# Patient Record
Sex: Female | Born: 1950 | Race: White | Hispanic: No | Marital: Married | Smoking: Former smoker
Health system: Southern US, Community
[De-identification: ages and names within clinical notes are randomized; demographics above are authoritative.]

## PROBLEM LIST (undated history)

## (undated) DIAGNOSIS — F1120 Opioid dependence, uncomplicated: Secondary | ICD-10-CM

## (undated) DIAGNOSIS — R51 Headache: Secondary | ICD-10-CM

## (undated) DIAGNOSIS — C50919 Malignant neoplasm of unspecified site of unspecified female breast: Secondary | ICD-10-CM

## (undated) DIAGNOSIS — E039 Hypothyroidism, unspecified: Secondary | ICD-10-CM

## (undated) DIAGNOSIS — S72451A Displaced supracondylar fracture without intracondylar extension of lower end of right femur, initial encounter for closed fracture: Secondary | ICD-10-CM

## (undated) DIAGNOSIS — K219 Gastro-esophageal reflux disease without esophagitis: Secondary | ICD-10-CM

## (undated) DIAGNOSIS — M1990 Unspecified osteoarthritis, unspecified site: Secondary | ICD-10-CM

## (undated) DIAGNOSIS — D649 Anemia, unspecified: Secondary | ICD-10-CM

## (undated) DIAGNOSIS — J189 Pneumonia, unspecified organism: Secondary | ICD-10-CM

## (undated) DIAGNOSIS — E079 Disorder of thyroid, unspecified: Secondary | ICD-10-CM

## (undated) DIAGNOSIS — I10 Essential (primary) hypertension: Secondary | ICD-10-CM

## (undated) DIAGNOSIS — F419 Anxiety disorder, unspecified: Secondary | ICD-10-CM

## (undated) DIAGNOSIS — T4145XA Adverse effect of unspecified anesthetic, initial encounter: Secondary | ICD-10-CM

## (undated) DIAGNOSIS — M8080XA Other osteoporosis with current pathological fracture, unspecified site, initial encounter for fracture: Secondary | ICD-10-CM

## (undated) DIAGNOSIS — F32A Depression, unspecified: Secondary | ICD-10-CM

## (undated) DIAGNOSIS — F329 Major depressive disorder, single episode, unspecified: Secondary | ICD-10-CM

## (undated) DIAGNOSIS — G8929 Other chronic pain: Secondary | ICD-10-CM

## (undated) DIAGNOSIS — M810 Age-related osteoporosis without current pathological fracture: Secondary | ICD-10-CM

## (undated) DIAGNOSIS — Z803 Family history of malignant neoplasm of breast: Secondary | ICD-10-CM

## (undated) HISTORY — PX: JOINT REPLACEMENT: SHX530

## (undated) HISTORY — DX: Family history of malignant neoplasm of breast: Z80.3

## (undated) HISTORY — PX: BACK SURGERY: SHX140

## (undated) HISTORY — PX: TONSILLECTOMY: SUR1361

---

## 1975-10-22 HISTORY — PX: CHOLECYSTECTOMY: SHX55

## 1998-09-01 ENCOUNTER — Ambulatory Visit (HOSPITAL_COMMUNITY): Admission: RE | Admit: 1998-09-01 | Discharge: 1998-09-01 | Payer: Self-pay | Admitting: Neurosurgery

## 1998-09-01 ENCOUNTER — Encounter: Payer: Self-pay | Admitting: Neurosurgery

## 1998-09-20 HISTORY — PX: NECK SURGERY: SHX720

## 1998-10-31 ENCOUNTER — Encounter: Payer: Self-pay | Admitting: Neurosurgery

## 1998-11-05 ENCOUNTER — Encounter: Payer: Self-pay | Admitting: Neurosurgery

## 1998-11-05 ENCOUNTER — Inpatient Hospital Stay (HOSPITAL_COMMUNITY): Admission: RE | Admit: 1998-11-05 | Discharge: 1998-11-06 | Payer: Self-pay | Admitting: Neurosurgery

## 1998-12-18 ENCOUNTER — Encounter: Payer: Self-pay | Admitting: Neurosurgery

## 1998-12-18 ENCOUNTER — Ambulatory Visit (HOSPITAL_COMMUNITY): Admission: RE | Admit: 1998-12-18 | Discharge: 1998-12-18 | Payer: Self-pay | Admitting: Neurosurgery

## 1999-03-05 ENCOUNTER — Other Ambulatory Visit: Admission: RE | Admit: 1999-03-05 | Discharge: 1999-03-05 | Payer: Self-pay | Admitting: Obstetrics and Gynecology

## 2001-06-26 ENCOUNTER — Encounter: Admission: RE | Admit: 2001-06-26 | Discharge: 2001-09-24 | Payer: Self-pay | Admitting: Family Medicine

## 2001-09-07 ENCOUNTER — Other Ambulatory Visit: Admission: RE | Admit: 2001-09-07 | Discharge: 2001-09-07 | Payer: Self-pay | Admitting: Obstetrics & Gynecology

## 2001-09-20 HISTORY — PX: GASTRIC BYPASS: SHX52

## 2001-09-20 HISTORY — PX: REPLACEMENT TOTAL KNEE: SUR1224

## 2002-04-22 ENCOUNTER — Emergency Department (HOSPITAL_COMMUNITY): Admission: EM | Admit: 2002-04-22 | Discharge: 2002-04-22 | Payer: Self-pay | Admitting: Emergency Medicine

## 2003-12-12 ENCOUNTER — Other Ambulatory Visit: Admission: RE | Admit: 2003-12-12 | Discharge: 2003-12-12 | Payer: Self-pay | Admitting: Obstetrics & Gynecology

## 2004-06-09 ENCOUNTER — Emergency Department (HOSPITAL_COMMUNITY): Admission: EM | Admit: 2004-06-09 | Discharge: 2004-06-10 | Payer: Self-pay | Admitting: Emergency Medicine

## 2004-09-09 ENCOUNTER — Encounter (INDEPENDENT_AMBULATORY_CARE_PROVIDER_SITE_OTHER): Payer: Self-pay | Admitting: *Deleted

## 2004-09-09 ENCOUNTER — Ambulatory Visit (HOSPITAL_COMMUNITY): Admission: RE | Admit: 2004-09-09 | Discharge: 2004-09-09 | Payer: Self-pay | Admitting: Gastroenterology

## 2005-05-10 ENCOUNTER — Inpatient Hospital Stay (HOSPITAL_COMMUNITY): Admission: RE | Admit: 2005-05-10 | Discharge: 2005-05-13 | Payer: Self-pay | Admitting: Orthopedic Surgery

## 2005-11-17 ENCOUNTER — Inpatient Hospital Stay (HOSPITAL_COMMUNITY): Admission: RE | Admit: 2005-11-17 | Discharge: 2005-11-20 | Payer: Self-pay | Admitting: Orthopedic Surgery

## 2006-04-18 ENCOUNTER — Encounter: Admission: RE | Admit: 2006-04-18 | Discharge: 2006-04-18 | Payer: Self-pay | Admitting: Neurosurgery

## 2006-06-10 ENCOUNTER — Inpatient Hospital Stay (HOSPITAL_COMMUNITY): Admission: RE | Admit: 2006-06-10 | Discharge: 2006-06-12 | Payer: Self-pay | Admitting: Neurosurgery

## 2007-12-05 ENCOUNTER — Emergency Department (HOSPITAL_COMMUNITY): Admission: EM | Admit: 2007-12-05 | Discharge: 2007-12-05 | Payer: Self-pay | Admitting: Emergency Medicine

## 2009-09-16 ENCOUNTER — Emergency Department (HOSPITAL_BASED_OUTPATIENT_CLINIC_OR_DEPARTMENT_OTHER): Admission: EM | Admit: 2009-09-16 | Discharge: 2009-09-16 | Payer: Self-pay | Admitting: Emergency Medicine

## 2009-09-16 ENCOUNTER — Ambulatory Visit: Payer: Self-pay | Admitting: Diagnostic Radiology

## 2010-09-08 ENCOUNTER — Emergency Department (HOSPITAL_BASED_OUTPATIENT_CLINIC_OR_DEPARTMENT_OTHER)
Admission: EM | Admit: 2010-09-08 | Discharge: 2010-09-08 | Payer: Self-pay | Source: Home / Self Care | Admitting: Emergency Medicine

## 2011-02-05 NOTE — Discharge Summary (Signed)
Deborah Cooke, Deborah Cooke                  ACCOUNT NO.:  [id number]   MEDICAL RECORD NO.:  [id number]          PATIENT TYPE:   LOCATION:                                 FACILITY:   PHYSICIAN:  Kirstin Tomasa Rand, P.A.    DATE OF BIRTH:   DATE OF ADMISSION:  05/10/2005  DATE OF DISCHARGE:  05/13/2005                                 DISCHARGE SUMMARY   ADMISSION DIAGNOSES:  1.  End-stage degenerative joint disease, left knee.  2.  Neuropathy.  3.  Degenerative joint disease.  4.  Depression/anxiety.   DISCHARGE DIAGNOSES:  1.  End-stage degenerative joint disease, left knee, status post total knee.  2.  Neuropathy.  3.  Degenerative joint disease.  4.  Depression/anxiety.   HISTORY OF PRESENT ILLNESS:  The patient is a 60 year old female who has  bilateral knee end-stage degenerative joint disease. She has failed  conservative care including anti-inflammatories, cortisone injections. She  understands the risks, benefits, and possible complications of a left total  knee replacement and is without question.   PROCEDURES IN-HOUSE:  On May 10, 2005, the patient underwent a left total  knee replacement by Dr. Thurston Hole and a femoral nerve block by anesthesia. She  tolerated both procedures well. She was admitted postoperatively for pain  control, DVT prophylaxis, and physical therapy. On postoperative day one,  the patient was alert and oriented, doing well, no complaints. T-max was  98.6. INR was 1.1. Her IV was discontinued and her PCA was discontinued. She  was started on Oxy-IR 5 mg one to three q.4h. p.r.n. pain. Her drain was  discontinued and her dressing was changed. On postoperative day two, T-max  was 100.8, potassium 3.1, hemoglobin 9.9. Her Foley was discontinued. Her  dressing was changed. Discharge planning was started on postoperative day  three. The patient was doing well. She did have a urinalysis that did show  Escherichia coli. Therefore, was discharged to home on Cipro  500 mg one p.o.  b.i.d. Also, Coumadin 5 mg one and a half tablet daily, Oxy-IR 5 mg one to  two q.4-6h., Neurontin 800 mg two tabs b.i.d., Zantac 150 mg b.i.d.,  Zanaflex 4 mg two tablets b.i.d., Lasix 20 mg daily, lorazepam 2 mg q.h.s.,  Kay Ciel 10 mEq daily, Xanax 0.5 mg daily, Wellbutrin 150 mg b.i.d., Biotin  1000 mg b.i.d., multivitamin with iron daily, calcium 600 mg b.i.d., B  Complex 100 mg daily, chromium picolinate 200 mg b.i.d. She is weight  bearing as tolerated, on a regular diet. She will use her CPM 0-90 degrees 8  hours a day, elevate her left heel on a folded pillow 30 minutes every  morning. She will call with increased pain or temperature greater than 101.  She will change her dressings daily. Call with increased redness, increased  swelling, or increased drainage. She can shower or bathe on May 17, 2005,  and she is to follow up with Dr. Thurston Hole on May 25, 2005.      Julien Girt, P.A.    KS/MEDQ  D:  08/30/2005  T:  08/30/2005  Job:  [id number]

## 2011-02-05 NOTE — Op Note (Signed)
Deborah Cooke, PROM                  ACCOUNT NO.:  [id number]   MEDICAL RECORD NO.:  [id number]          PATIENT TYPE:  INP   LOCATION:  5018                         FACILITY:  [facility]   PHYSICIAN:  Elana Alm. Thurston Hole, M.D. DATE OF BIRTH:  1951/01/24   DATE OF PROCEDURE:  05/10/2005  DATE OF DISCHARGE:                                 OPERATIVE REPORT   PREOPERATIVE DIAGNOSIS:  Left knee degenerative joint disease.   POSTOPERATIVE DIAGNOSIS:  Left knee degenerative joint disease.   PROCEDURE:  1.  Left total knee replacement using DePuy cemented total knee system with      #3 cemented femur, #3 cemented tibia with 12.5 mm polyethylene RP tibial      spacer and 35 mm polyethylene cemented patella.  2.  Left total knee computer assisted navigation.   SURGEON:  Dr. Salvatore Marvel.   ASSISTANT:  Kirstin Tomasa Rand, P.A.   ANESTHESIA:  General.   OPERATIVE TIME:  1 hour 45 minutes.   TOURNIQUET TIME:  1 hour 20 minutes.   COMPLICATIONS:  None.   DESCRIPTION OF PROCEDURE:  Ms. Steines was brought to the operating room on  May 10, 2005, placed on the operative table in the supine position.  A  femoral nerve block was then placed in the holding room by Anesthesia for  postoperative pain control.  She received Ancef 1 g IV preoperatively for  prophylaxis.  After being placed under general anesthesia, she had a Foley  catheter placed under sterile conditions.  Her left knee was examined.  Range of motion from -10 to 125 degrees, knee stable to ligamentous exam  with normal patella tracking with moderate varus deformity.  Her left foot  and leg was then prepped using sterile DuraPrep and draped using sterile  technique.  Leg was exsanguinated and a thigh tourniquet elevated to 375 mm.  Initially, through a 15 cm longitudinal incision based over the patella  initial exposure was made.  Underlying subcutaneous tissues were incised in  line with the skin incision.  A median arthrotomy was  performed, revealing  an excessive amount of normal appearing joint fluid.  The articular surfaces  were inspected.  She had grade 4 changes medially, grade 3 and 4 changes  laterally and grade 3 and 4 changes in the patellofemoral joint.  Osteophytes were removed from the femoral condyles and tibial plateau.  The  medial and lateral meniscal remnants were removed as well as the anterior  cruciate ligament.  At this point then, two pins were placed in the proximal  tibial shaft and two pins placed in the distal femoral shaft for placement  of the computer assisted navigation devices.  These were then placed in  position and activated.  Initial deformity showed flexion deformity of 9  degrees and varus deformity of 6 degrees.  Using the computer navigation, a  distal femoral cut was then made, resecting 10 mm off the distal femur, and  then the distal femur was sized.  This was found to be a #3 size.  The #3  cutting jig was placed and  then these cuts were made and they were verified  from the computer navigation system.  The proximal tibial cut was then made  also using computer navigation, resecting 10 mm off the lateral side and 6  mm off the medial or lower side with the appropriate amount of rotation and  slope.  This was also verified with the navigation system.  After this was  done, then flexion and extension gaps were tested and they were found to be  excellent and equal with a 12.5 mm spacer.  At this point then the #3 tibial  base plate was placed and the keel cut was made.  The box cutter for the PCL  prosthesis was also then placed and then these cuts were made.  At this  point then, the #3 femoral trial was placed, the #3 tibial base plate trial  was placed and with a 12.5 mm polyethylene RP tibial spacer there was found  to be excellent restoration of normal alignment, excellent correction of her  varus and flexion deformities, range of motion from 0-125 degrees.  The  patella  was incised.  A resurfacing 8.5 mm cut was made and 3 locking holes  were placed for placement of the 35 mm polyethylene patella trial which was  placed.  Patellofemoral tracking was again evaluated and this was found to  be normal.  At this point, it was felt that all of the trial components were  of excellent size, fit and stability.  They were then removed and the knee  was then gent lavage irrigated with 3 liters of saline solution.  The  proximal tibia was then exposed and the #3 tibial base plate was cemented  back and was hammered into position with an excellent fit and with excess  cement being removed from around the edges.  A #3 femoral component was  cemented back and was hammered into position, also with an excellent fit  with excess cement being removed from around the edges.  A #3 femoral  component was cemented back and was hammered into position, also with an  excellent fit, with excess cement being removed from around the edges.  The  12.5 mm polyethylene RP tibial spacer was then placed on the tibial base  plate.  The knee was then reduced, taken through a full range of motion,  found to be stable, leg lengths equal with excellent correction of her varus  and flexion deformity.  The 35 mm polyethylene cement back patella was then  placed in its position and held there with a clamp.  After the cement had  hardened, patellofemoral tracking was evaluated and this was found to be  normal.  At this point, it was felt that all of the components were of  excellent size, fit and stability.  The tourniquet was then released,  hemostasis was obtained with cautery.  The arthrotomy was then closed with  #3 Ethibond suture over two medium Hemovac drains.  Subcutaneous tissue was  closed with 0 and 2-0 Vicryl, subcuticular layer closed with 4-0 Monocryl,  Steri-Strips were applied, sterile dressings and a long leg splint applied, the Hemovac injected with 0.25% Marcaine with epinephrine  and 4 mg of  morphine and clamped, the patient then awakened, extubated and taken to the  recovery room in stable condition.  Needle and sponge counts correct x2 at  the end of the case.      Robert A. Thurston Hole, M.D.  Electronically Signed     RAW/MEDQ  D:  05/10/2005  T:  05/10/2005  Job:  [id number]

## 2011-02-05 NOTE — Op Note (Signed)
Deborah Cooke, Deborah Cooke NO.:  [id number]   MEDICAL RECORD NO.:  [id number]          PATIENT TYPE:  INP   LOCATION:  2899                         FACILITY:  [facility]   PHYSICIAN:  Hilda Lias, M.D.   DATE OF BIRTH:  Nov 03, 1950   DATE OF PROCEDURE:  06/10/2006  DATE OF DISCHARGE:                                 OPERATIVE REPORT   PREOPERATIVE DIAGNOSES:  C7-T1 spondylolisthesis with C8 radiculopathy,  status post anterior/posterior fusion times three.   POSTOPERATIVE DIAGNOSES:  C7-T1 spondylolisthesis with C8 radiculopathy,  status post anterior/posterior fusion times three.   PROCEDURES:  C7-T1 laminotomy and foraminotomy; lateral mass screws, C7-T1;  posterolateral arthrodesis.   SURGEON:  Hilda Lias, M.D.   ASSISTANT:  Clydene Fake, M.D.   CLINICAL HISTORY:  [name] is a 61 year old female, who had  anterior/posterior fusion x3 by a different surgeon.  Now, the lady is  complaining of neck pain, with pain going to both upper extremities.  X-rays  showed that she is well fused, but at the level of C7-T1, she has  spondylolisthesis, with a 4 to 5-mm stepoff.  Surgery was advised.  This  lady has had multiple surgeries:  gastric bypass, knee replacement, 3  cervical procedures.  She knows about the risks, such as infection, no  improvement whatsoever, failure of the hardware, need for further surgery.   DESCRIPTION OF PROCEDURE:  The patient was taken to the OR, and after  intubation, pins were applied to the head and she was positioned in a prone  manner.  The neck was prepped with DuraPrep.  Drapes were applied.  A  midline incision was made from the lower cervical area to the upper thoracic  area.  We found quite a bit of scar tissue, but, again, we were able to  disattach the muscle from the spinous processes and laminae.  We brought the  C-arm, but this lady is quite obese with a large shoulder, and it was  difficult to see any landmarks.   Nevertheless, we used a towel clip, and  there was an area were C7-T1 was loose.  Bone below was completely solid.  We counted and, indeed, that was C7-T1.  Immediately, we did a drill; we did  a laminotomy of C6 and C7 and T1, and foraminotomies with decompression of  the nerve roots.  On the right side, it was quite easy; on the left side,  the ligament was calcified.  Because we were not using the C-arm, with the  nerve hook, we localized the pedicle.  The pedicle was probed and 2 screws  of 18 mm were inserted.  A lateral screw of 5 mm was inserted in the facet  of C7.  That area was completely solid.  Then, a rod was put in place and  secured in place with a cap.  From then on, we  removed the periosteum of the laminae of C7 and thoracic 1, as well as  lateral.  A mix of autograft, allograft and BMP was used for arthrodesis.  From then on, the area  was irrigated.  The wound was closed with Vicryl and  Steri-Strips.           ______________________________  Hilda Lias, M.D.     EB/MEDQ  D:  06/10/2006  T:  06/10/2006  Job:  [id number]   cc:   Clydene Fake, M.D.

## 2011-02-05 NOTE — Discharge Summary (Signed)
NAMEPHILICIA, Deborah Cooke                  ACCOUNT NO.:  [id number]   MEDICAL RECORD NO.:  [id number]          PATIENT TYPE:  INP   LOCATION:  5028                         FACILITY:  [facility]   PHYSICIAN:  Elana Alm. Thurston Hole, M.D. DATE OF BIRTH:  [birth year]   DATE OF ADMISSION:  11/17/2005  DATE OF DISCHARGE:  11/20/2005                                 DISCHARGE SUMMARY   ADMISSION DIAGNOSES:  1.  End-stage degenerative joint disease right knee.  2.  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  3.  Hypertension.   DISCHARGE DIAGNOSES:  1.  End-stage degenerative joint disease right knee, status post total knee      replacement.  2.  Chronic pain from back surgery x3.  3.  Hypertension.  4.  Asthma.   PROCEDURE:  On November 17, 2005, patient underwent a right total knee  replacement by Dr. Thurston Hole and a femoral nerve block by anesthesia.   HISTORY OF PRESENT ILLNESS:  Patient is a 60 year old white female who has a  history of bilateral knee end-stage DJD.  She had a left total knee  replacement May 10, 2005, and is now doing very well with this.  Her  right knee has failed conservative care.  She understands risks, benefits  and possible complications of a total knee replacement and is without  question.   She was admitted on November 17, 2005, and underwent total knee replacement  and femoral nerve block and postoperatively was admitted for pain control,  DVT prophylaxis and physical therapy.   On postoperative day #1, patient had significant difficulty with low back  spasms but her knee was good.  T-max of 100.4, hemoglobin was 9.3, her INR  was 1.1.  She was metabolically stable.  She was started on OxyContin 10 mg  one p.o. b.i.d., Oxy IR 1 to 2 mg q.3h. p.r.n. pain, Robaxin, no rehab  consult was ordered.  No social work consult was ordered.  Patient ambulated  20 feet with a rolling walker, minimal assist and did well.   Postoperative day #2, patient continued to improve.   T-max of 101.1, blood  pressure was 95/67.  Hemoglobin was 8.8.  She was metabolically stable.  A  follow-up UA was ordered which came back completely clear.  Surgical wound  is well approximated.  Her dressing was changed.  Her Foley was  discontinued.  Her IV was heplocked and she was given a suppository.   On postoperative day #3, she was discharged to home in stable condition.  Her hemoglobin was 8.4, white cell count 7.9.  She was metabolically stable.  Her IV was discontinued.  She was given Percocet, OxyContin, Robaxin and  Ambien.  She will follow up with Dr. Thurston Hole in 10 days.  She was given a CPM  to use at home. She is weightbearing as tolerated.      Kirstin Shepperson, P.A.      Robert A. Thurston Hole, M.D.  Electronically Signed    KS/MEDQ  D:  01/07/2006  T:  01/10/2006  Job:  [id number]

## 2011-02-05 NOTE — H&P (Signed)
Deborah Cooke, Deborah Cooke NO.:  [id number]   MEDICAL RECORD NO.:  [id number]          PATIENT TYPE:  INP   LOCATION:  2899                         FACILITY:  [facility]   PHYSICIAN:  Hilda Lias, M.D.   DATE OF BIRTH:  December 15, 1950   DATE OF ADMISSION:  06/10/2006  DATE OF DISCHARGE:                                HISTORY & PHYSICAL   Ms. Friend is a lady who came to see me in my office because of neck pain  going to the shoulder and numbness in both hands. This lady has three  cervical surgeries, two of them done by [facility] D. Gasper Sells, M.D. through a  posterior-anterior approach and later on fusion anteriorly with corpectomy  of 4, 5, and 6.  The patient tells me that the pain is getting worse.  She  is concerned about the numbness.  The patient has x-ray including myelogram.  Because of the findings, we are going to bring her to surgery.   PAST MEDICAL HISTORY:  Gastric bypass.  Abdominoplasty.  Knee replacement.  Three cervical procedures.   FAMILY HISTORY:  Mother is 37 years old with Alzheimer's.   SOCIAL HISTORY:  She drinks.  She does not smoke.   PHYSICAL EXAMINATION:  HEENT:  Normal.  NECK:  There is a scar posterior.  She is able to flex but extension causes  pain going to both shoulders.  LUNGS:  Clear.  HEART:  Sounds normal.  EXTREMITIES:  Normal pulses.  NEUROLOGIC:  Mental status normal.  Cranial nerves normal.  Strength:  I can  break the left deltoid and biceps.  She has weakness in thenar  muscle.  She  has Tinel's signs posterior bilaterally.  The cervical spine x-rays shows  anterior plate from C3 down to C7.  One of the screws broken.  The cervical  myelogram showed that she has a fusion from C2 down to C6-C7 with some  spondylosis at the level 4-5.  There is 4 mm spondylolisthesis between 7 and  1.   CLINICAL IMPRESSION:  C7-T1 spondylolisthesis 4 mm with C8 radiculopathy.  Status post cervical fusion anterior-posterior.   RECOMMENDATIONS:   Patient being admitted for surgery.  The procedure will be  posterior approach with laminotomy and foraminotomy C7-T1 with fusion with  lateral mass screws and BMP and autograft.  Patient knows of the risks such  as no improvement whatsoever, need for further surgery, hematoma, damage to  vertebral artery, failure of the hardware, paralysis.           ______________________________  Hilda Lias, M.D.     EB/MEDQ  D:  06/10/2006  T:  06/13/2006  Job:  [id number]

## 2011-02-05 NOTE — Op Note (Signed)
NAMEELOYCE, BULTMAN NO.:  [id number]   MEDICAL RECORD NO.:  [id number]          PATIENT TYPE:  AMB   LOCATION:  ENDO                         FACILITY:  [facility]   PHYSICIAN:  Graylin Shiver, M.D.   DATE OF BIRTH:  [birth year]   DATE OF PROCEDURE:  09/09/2004  DATE OF DISCHARGE:                                 OPERATIVE REPORT   PROCEDURE:  Colonoscopy with polypectomy and biopsy.   ENDOSCOPIST:  Graylin Shiver, M.D.   INDICATION:  Intermittent rectal bleeding.   INFORMED CONSENT:  Informed consent was obtained after explanation of the  risks of bleeding, infection and perforation.   PRE-MEDICATIONS:  Fentanyl 120 mcg IV, Versed 12 mg IV.   PROCEDURE:  With the patient in the left lateral decubitus position, a  rectal exam was performed.  No masses were felt.  The Olympus colonoscope  was inserted into the rectum and advanced around the colon to the cecum.  Cecal landmarks were identified.  The cecum and ascending colon were normal.  The transverse colon was normal.  The descending colon and sigmoid were  normal.  In the rectum, there was a 6-mm sessile polyp which was snared with  a mini-snare and removed by snare cautery technique.  The cautery site  looked good and the polyp was retrieved.  On retroflexion, there was a small  3-mm polyp seen; this was biopsied off with cold forceps.  There was also an  internal hemorrhoid seen.  She tolerated this procedure well without  complications.   IMPRESSION:  1.  Two rectal polyps.  2.  Internal hemorrhoid.   PLAN:  The pathology will be checked.       SFG/MEDQ  D:  09/09/2004  T:  09/10/2004  Job:  [id number]   cc:   Meredith Staggers, M.D.  [address]  [place]  [place] [address]  Fax: [phone]

## 2011-02-05 NOTE — Op Note (Signed)
Deborah Cooke, Deborah Cooke                  ACCOUNT NO.:  [id number]   MEDICAL RECORD NO.:  [id number]          PATIENT TYPE:  INP   LOCATION:  5028                         FACILITY:  [facility]   PHYSICIAN:  Elana Alm. Thurston Hole, M.D. DATE OF BIRTH:  05-Feb-1951   DATE OF PROCEDURE:  11/17/2005  DATE OF DISCHARGE:                                 OPERATIVE REPORT   PREOPERATIVE DIAGNOSIS:  Right knee degenerative joint disease.   POSTOPERATIVE DIAGNOSIS:  Right knee degenerative joint disease.   PROCEDURE:  1.  Right total knee replacement using DePuy cemented total knee system with      number 3 cemented femur, number 4 cemented tibia, with 10 mm      polyethylene RP tibial spacer and 35 mm polyethylene cemented patella.  2.  Right total knee computer assisted navigation.   SURGEON:  Elana Alm. Thurston Hole, M.D.   ASSISTANT:  Kirstin Shepperson, P.A.-C.   ANESTHESIA:  General.   OPERATIVE TIME:  1 hour 40 minutes.   COMPLICATIONS:  None.   DESCRIPTION OF PROCEDURE:  Ms. Rasmus was brought to operating room on  November 17, 2005, and placed on the operating table in the supine position.  She had a femoral nerve block placed in holding room by anesthesia.  She  received Ancef 1 gram IV preoperatively for prophylaxis.  After being placed  under general anesthesia, she had a Foley catheter placed under sterile  conditions.  Her right knee was examined.  She had range of motion from -5  to 125 degrees with mild varus deformity, knee stable ligamentous exam with  normal patellar tracking.  The right leg and foot was prepped using sterile  DuraPrep and draped using sterile technique.  The leg was exsanguinated and  a thigh tourniquet elevated 375 mmHg.  Initially, through a 15 cm  longitudinal incision based over the patella, initial exposure was made.  The underlying subcutaneous tissues were incised in line with the skin  incision.  A median arthrotomy was performed revealing an excessive amount  of  normal appearing joint fluid.  The articular surfaces were inspected.  She had grade 4 changes medially, grade 3 changes laterally, and grade 3 and  4 changes in the patellofemoral joint.  The medial and lateral meniscal  remnants were removed as well as the anterior cruciate ligament.  Osteophytes were removed off the femoral condyles and tibial plateau.  The  computer navigation system was then set up.  Two pins were placed in the  proximal tibial metaphysis and two pins placed in the distal femoral  metaphysis for placement and activation of the computer navigation system.  The navigation system was then activated and measured the deformities  appropriately with approximately 5 degrees of flexion contracture and 5 to 6  degrees of varus malalignment.  The computer navigation system was then used  to make perfect and exact cuts off the distal femur in the appropriate  manner.  These cuts were then verified.  The anterior, posterior, and  chamfer cuts were then made as well using computer navigation guidance.  After this  was done, then the proximal tibia cut was also made using the  computer navigation system, as well. After this cut was made, spacer blocks  were placed, a 10 degree block in flexion and extension, each of these  blocks showed excellent correction of her flexion and varus deformities and  excellent stability.  At this point, the number 4 tibial base plate trial  was placed and the keel cut was made.  The number 3 PCL box cutter was  placed on the distal femur and then these cuts were made.  The number 3  femoral trial component was then placed, the number 4 tibial base plate  trial component was then placed, and with a 10 mm polyethylene RP tibial  spacer, the knee was reduced, taken through a range of motion, found to have  corrected both her flexion and varus deformities.  Range of motion from 0 to  125 degrees with excellent stability.  After this was done, the computer   navigation system was deactivated and the pins were removed.  The patella  was incised and a 10 mm resurfacing cut was made on the patella and three  locking holes placed for the patella trial which was a 35 mm size.  Patellofemoral tracking was then evaluated and this was found to be normal.  At this point, it was felt that all trial components were excellent size,  fit,  and stability.  They were then removed and the knee was then jet  lavage irrigated with 3 liters of saline solution.  The proximal tibia was  exposed and number 4 tibial baseplate with cement backing was hammered into  position with an excellent fit with excess cement being removed from around  the edges.  A number 3 femoral component with cement backing was hammered  into position also with an excellent fit with excess cement being removed  from around the edges.  The 10 mm polyethylene RP tibial spacer was placed  on the tibial baseplate, the knee reduced, taken through a range of motion  from 0 to 125 degrees with excellent stability and excellent correction of  her flexion and varus deformities.  The 35 mm polyethylene cement backed  patella was then placed into position and clamped.  After the cement  hardened, patellofemoral tracking was again evaluated and this was found to  be normal.  At this point, it was felt that all the components were of  excellent size and stability.  The knee was further irrigated with saline.  The tourniquet was released.  Hemostasis obtained with cautery.  The  arthrotomy was then closed with #1 Ethilon suture over two medium Hemovac  drains.  The subcutaneous tissues were closed with 0 and 2-0 Vicryl,  subcuticular layer closed with 4-0 Monocryl.  Steri-Strips were applied.  Sterile dressings and a long-leg splint applied.  Hemovac injected with 0.2%  Marcaine with epinephrine 4 mg morphine and clamped.  She was then awakened, extubated, and taken to recovery in stable condition.  Needle  and sponge  counts were correct x2 at the end of the case.      Robert A. Thurston Hole, M.D.  Electronically Signed     RAW/MEDQ  D:  11/17/2005  T:  11/17/2005  Job:  [id number]

## 2011-08-21 DIAGNOSIS — T4145XA Adverse effect of unspecified anesthetic, initial encounter: Secondary | ICD-10-CM

## 2011-08-21 HISTORY — DX: Adverse effect of unspecified anesthetic, initial encounter: T41.45XA

## 2011-08-21 HISTORY — PX: WRIST FRACTURE SURGERY: SHX121

## 2012-01-19 DIAGNOSIS — T4145XA Adverse effect of unspecified anesthetic, initial encounter: Secondary | ICD-10-CM

## 2012-01-19 HISTORY — DX: Adverse effect of unspecified anesthetic, initial encounter: T41.45XA

## 2012-03-07 ENCOUNTER — Ambulatory Visit: Payer: Medicare Other | Attending: Sports Medicine | Admitting: Physical Therapy

## 2012-03-07 DIAGNOSIS — M25519 Pain in unspecified shoulder: Secondary | ICD-10-CM | POA: Insufficient documentation

## 2012-03-07 DIAGNOSIS — IMO0001 Reserved for inherently not codable concepts without codable children: Secondary | ICD-10-CM | POA: Insufficient documentation

## 2012-03-07 DIAGNOSIS — M25619 Stiffness of unspecified shoulder, not elsewhere classified: Secondary | ICD-10-CM | POA: Insufficient documentation

## 2012-03-15 ENCOUNTER — Ambulatory Visit: Payer: Medicare Other | Admitting: Physical Therapy

## 2012-03-29 ENCOUNTER — Ambulatory Visit: Payer: Medicare Other | Attending: Sports Medicine | Admitting: Physical Therapy

## 2012-03-29 DIAGNOSIS — M25519 Pain in unspecified shoulder: Secondary | ICD-10-CM | POA: Insufficient documentation

## 2012-03-29 DIAGNOSIS — M25619 Stiffness of unspecified shoulder, not elsewhere classified: Secondary | ICD-10-CM | POA: Insufficient documentation

## 2012-03-29 DIAGNOSIS — IMO0001 Reserved for inherently not codable concepts without codable children: Secondary | ICD-10-CM | POA: Insufficient documentation

## 2012-04-05 ENCOUNTER — Ambulatory Visit: Payer: Medicare Other | Admitting: Physical Therapy

## 2012-04-12 ENCOUNTER — Encounter: Payer: Medicare Other | Admitting: Physical Therapy

## 2012-04-12 ENCOUNTER — Ambulatory Visit: Payer: Medicare Other | Admitting: Rehabilitation

## 2012-04-17 ENCOUNTER — Ambulatory Visit: Payer: Medicare Other | Admitting: Physical Therapy

## 2012-04-19 ENCOUNTER — Encounter: Payer: Medicare Other | Admitting: Physical Therapy

## 2012-04-26 ENCOUNTER — Encounter: Payer: Medicare Other | Admitting: Physical Therapy

## 2012-09-16 ENCOUNTER — Emergency Department (HOSPITAL_COMMUNITY): Payer: Medicare Other

## 2012-09-16 ENCOUNTER — Inpatient Hospital Stay (HOSPITAL_COMMUNITY)
Admission: EM | Admit: 2012-09-16 | Discharge: 2012-09-20 | DRG: 481 | Disposition: A | Discharge status: Home Health | Payer: Medicare Other | Attending: Orthopedic Surgery | Admitting: Orthopedic Surgery

## 2012-09-16 ENCOUNTER — Encounter (HOSPITAL_COMMUNITY): Payer: Self-pay | Admitting: Physical Medicine and Rehabilitation

## 2012-09-16 DIAGNOSIS — Z9884 Bariatric surgery status: Secondary | ICD-10-CM

## 2012-09-16 DIAGNOSIS — W19XXXA Unspecified fall, initial encounter: Secondary | ICD-10-CM | POA: Diagnosis present

## 2012-09-16 DIAGNOSIS — Y92009 Unspecified place in unspecified non-institutional (private) residence as the place of occurrence of the external cause: Secondary | ICD-10-CM

## 2012-09-16 DIAGNOSIS — S7290XA Unspecified fracture of unspecified femur, initial encounter for closed fracture: Secondary | ICD-10-CM

## 2012-09-16 DIAGNOSIS — G8929 Other chronic pain: Secondary | ICD-10-CM | POA: Diagnosis present

## 2012-09-16 DIAGNOSIS — I10 Essential (primary) hypertension: Secondary | ICD-10-CM | POA: Diagnosis present

## 2012-09-16 DIAGNOSIS — Z981 Arthrodesis status: Secondary | ICD-10-CM

## 2012-09-16 DIAGNOSIS — M8080XA Other osteoporosis with current pathological fracture, unspecified site, initial encounter for fracture: Secondary | ICD-10-CM | POA: Diagnosis present

## 2012-09-16 DIAGNOSIS — F329 Major depressive disorder, single episode, unspecified: Secondary | ICD-10-CM | POA: Diagnosis present

## 2012-09-16 DIAGNOSIS — F419 Anxiety disorder, unspecified: Secondary | ICD-10-CM | POA: Diagnosis present

## 2012-09-16 DIAGNOSIS — E039 Hypothyroidism, unspecified: Secondary | ICD-10-CM | POA: Diagnosis present

## 2012-09-16 DIAGNOSIS — F3289 Other specified depressive episodes: Secondary | ICD-10-CM | POA: Diagnosis present

## 2012-09-16 DIAGNOSIS — E669 Obesity, unspecified: Secondary | ICD-10-CM | POA: Diagnosis present

## 2012-09-16 DIAGNOSIS — Z96659 Presence of unspecified artificial knee joint: Secondary | ICD-10-CM

## 2012-09-16 DIAGNOSIS — E079 Disorder of thyroid, unspecified: Secondary | ICD-10-CM | POA: Diagnosis present

## 2012-09-16 DIAGNOSIS — F411 Generalized anxiety disorder: Secondary | ICD-10-CM | POA: Diagnosis present

## 2012-09-16 DIAGNOSIS — S72451A Displaced supracondylar fracture without intracondylar extension of lower end of right femur, initial encounter for closed fracture: Secondary | ICD-10-CM | POA: Diagnosis present

## 2012-09-16 DIAGNOSIS — M810 Age-related osteoporosis without current pathological fracture: Secondary | ICD-10-CM | POA: Diagnosis present

## 2012-09-16 DIAGNOSIS — G894 Chronic pain syndrome: Secondary | ICD-10-CM | POA: Diagnosis present

## 2012-09-16 DIAGNOSIS — D62 Acute posthemorrhagic anemia: Secondary | ICD-10-CM | POA: Diagnosis not present

## 2012-09-16 DIAGNOSIS — S72453A Displaced supracondylar fracture without intracondylar extension of lower end of unspecified femur, initial encounter for closed fracture: Principal | ICD-10-CM | POA: Diagnosis present

## 2012-09-16 HISTORY — DX: Age-related osteoporosis without current pathological fracture: M81.0

## 2012-09-16 HISTORY — DX: Other osteoporosis with current pathological fracture, unspecified site, initial encounter for fracture: M80.80XA

## 2012-09-16 HISTORY — DX: Anxiety disorder, unspecified: F41.9

## 2012-09-16 HISTORY — DX: Headache: R51

## 2012-09-16 HISTORY — DX: Adverse effect of unspecified anesthetic, initial encounter: T41.45XA

## 2012-09-16 HISTORY — DX: Essential (primary) hypertension: I10

## 2012-09-16 HISTORY — DX: Disorder of thyroid, unspecified: E07.9

## 2012-09-16 HISTORY — DX: Displaced supracondylar fracture without intracondylar extension of lower end of right femur, initial encounter for closed fracture: S72.451A

## 2012-09-16 LAB — POCT I-STAT, CHEM 8
BUN: 18 mg/dL (ref 6–23)
Chloride: 102 mEq/L (ref 96–112)
Glucose, Bld: 110 mg/dL — ABNORMAL HIGH (ref 70–99)
TCO2: 28 mmol/L (ref 0–100)

## 2012-09-16 LAB — CBC WITH DIFFERENTIAL/PLATELET
Basophils Absolute: 0 10*3/uL (ref 0.0–0.1)
Basophils Relative: 0 % (ref 0–1)
Eosinophils Absolute: 0 10*3/uL (ref 0.0–0.7)
Lymphs Abs: 1.1 10*3/uL (ref 0.7–4.0)
MCHC: 34.2 g/dL (ref 30.0–36.0)
MCV: 86.9 fL (ref 78.0–100.0)
Monocytes Relative: 10 % (ref 3–12)
Neutro Abs: 7 10*3/uL (ref 1.7–7.7)
Platelets: 306 10*3/uL (ref 150–400)
RDW: 13 % (ref 11.5–15.5)

## 2012-09-16 MED ORDER — OXYCODONE HCL 5 MG PO TABS: 5.0000 mg | ORAL_TABLET | ORAL | Status: DC | PRN

## 2012-09-16 MED ORDER — SODIUM CHLORIDE 0.9 % IV SOLN
INTRAVENOUS | Status: DC
  Administered 2012-09-16 – 2012-09-18 (×3): via INTRAVENOUS

## 2012-09-16 MED ORDER — LIOTHYRONINE SODIUM 25 MCG PO TABS
25.0000 ug | ORAL_TABLET | Freq: Every day | ORAL | Status: DC
  Administered 2012-09-17 – 2012-09-20 (×4): 25 ug via ORAL
  Filled 2012-09-16 (×6): qty 1

## 2012-09-16 MED ORDER — POTASSIUM CHLORIDE CRYS ER 10 MEQ PO TBCR
10.0000 meq | EXTENDED_RELEASE_TABLET | Freq: Every day | ORAL | Status: DC
  Administered 2012-09-16 – 2012-09-20 (×5): 10 meq via ORAL
  Filled 2012-09-16 (×5): qty 1

## 2012-09-16 MED ORDER — SODIUM CHLORIDE 0.9 % IV SOLN
INTRAVENOUS | Status: DC
  Administered 2012-09-16: 12:00:00 via INTRAVENOUS

## 2012-09-16 MED ORDER — ENOXAPARIN SODIUM 30 MG/0.3ML ~~LOC~~ SOLN
30.0000 mg | Freq: Once | SUBCUTANEOUS | Status: AC
  Administered 2012-09-16: 30 mg via SUBCUTANEOUS
  Filled 2012-09-16: qty 0.3

## 2012-09-16 MED ORDER — OXYCODONE HCL 5 MG PO TABS
30.0000 mg | ORAL_TABLET | ORAL | Status: DC | PRN
  Administered 2012-09-16 – 2012-09-17 (×2): 30 mg via ORAL
  Administered 2012-09-17: 5 mg via ORAL
  Administered 2012-09-17 (×2): 30 mg via ORAL
  Administered 2012-09-17: 25 mg via ORAL
  Administered 2012-09-18 – 2012-09-20 (×5): 30 mg via ORAL
  Filled 2012-09-16: qty 6
  Filled 2012-09-16: qty 2
  Filled 2012-09-16 (×8): qty 6
  Filled 2012-09-16: qty 4
  Filled 2012-09-16: qty 1
  Filled 2012-09-16: qty 6

## 2012-09-16 MED ORDER — BUPROPION HCL ER (SR) 100 MG PO TB12
200.0000 mg | ORAL_TABLET | Freq: Two times a day (BID) | ORAL | Status: DC
  Administered 2012-09-16 – 2012-09-20 (×8): 200 mg via ORAL
  Filled 2012-09-16 (×9): qty 2

## 2012-09-16 MED ORDER — SENNOSIDES-DOCUSATE SODIUM 8.6-50 MG PO TABS: 1.0000 | ORAL_TABLET | Freq: Every evening | ORAL | Status: DC | PRN

## 2012-09-16 MED ORDER — DOCUSATE SODIUM 100 MG PO CAPS
100.0000 mg | ORAL_CAPSULE | Freq: Two times a day (BID) | ORAL | Status: DC
  Administered 2012-09-16 – 2012-09-18 (×4): 100 mg via ORAL
  Filled 2012-09-16 (×5): qty 1

## 2012-09-16 MED ORDER — HYDROMORPHONE HCL PF 1 MG/ML IJ SOLN
1.0000 mg | Freq: Once | INTRAMUSCULAR | Status: AC
  Administered 2012-09-16: 1 mg via INTRAVENOUS
  Filled 2012-09-16: qty 1

## 2012-09-16 MED ORDER — GABAPENTIN 400 MG PO CAPS
800.0000 mg | ORAL_CAPSULE | Freq: Two times a day (BID) | ORAL | Status: DC
  Administered 2012-09-16 – 2012-09-20 (×8): 800 mg via ORAL
  Filled 2012-09-16 (×9): qty 2

## 2012-09-16 MED ORDER — METHOCARBAMOL 500 MG PO TABS: 500.0000 mg | ORAL_TABLET | Freq: Four times a day (QID) | ORAL | Status: DC | PRN

## 2012-09-16 MED ORDER — FLEET ENEMA 7-19 GM/118ML RE ENEM
1.0000 | ENEMA | Freq: Once | RECTAL | Status: AC | PRN
  Filled 2012-09-16: qty 1

## 2012-09-16 MED ORDER — ENOXAPARIN SODIUM 30 MG/0.3ML ~~LOC~~ SOLN
30.0000 mg | Freq: Two times a day (BID) | SUBCUTANEOUS | Status: DC
  Administered 2012-09-17: 30 mg via SUBCUTANEOUS
  Filled 2012-09-16 (×5): qty 0.3

## 2012-09-16 MED ORDER — ALPRAZOLAM 0.5 MG PO TABS
1.0000 mg | ORAL_TABLET | Freq: Four times a day (QID) | ORAL | Status: DC | PRN
  Administered 2012-09-17 – 2012-09-18 (×3): 1 mg via ORAL
  Filled 2012-09-16: qty 1
  Filled 2012-09-16 (×2): qty 2

## 2012-09-16 MED ORDER — FAMOTIDINE 40 MG PO TABS
40.0000 mg | ORAL_TABLET | Freq: Every day | ORAL | Status: DC
  Administered 2012-09-16 – 2012-09-20 (×5): 40 mg via ORAL
  Filled 2012-09-16 (×5): qty 1

## 2012-09-16 MED ORDER — CYCLOBENZAPRINE HCL 10 MG PO TABS
10.0000 mg | ORAL_TABLET | Freq: Three times a day (TID) | ORAL | Status: DC | PRN
  Administered 2012-09-16 – 2012-09-19 (×3): 10 mg via ORAL
  Filled 2012-09-16 (×3): qty 1

## 2012-09-16 MED ORDER — HYDROMORPHONE HCL PF 1 MG/ML IJ SOLN
1.0000 mg | INTRAMUSCULAR | Status: DC | PRN
  Administered 2012-09-16 – 2012-09-17 (×4): 1 mg via INTRAVENOUS
  Filled 2012-09-16 (×4): qty 1

## 2012-09-16 MED ORDER — METHOCARBAMOL 100 MG/ML IJ SOLN: 500.0000 mg | Freq: Four times a day (QID) | INTRAVENOUS | Status: DC | PRN

## 2012-09-16 MED ORDER — LORAZEPAM 1 MG PO TABS
4.0000 mg | ORAL_TABLET | Freq: Every day | ORAL | Status: DC
  Administered 2012-09-16 – 2012-09-19 (×4): 4 mg via ORAL
  Filled 2012-09-16: qty 3
  Filled 2012-09-16 (×3): qty 4
  Filled 2012-09-16: qty 1

## 2012-09-16 MED ORDER — QUETIAPINE FUMARATE ER 300 MG PO TB24
300.0000 mg | ORAL_TABLET | Freq: Every day | ORAL | Status: DC
  Administered 2012-09-16 – 2012-09-20 (×5): 300 mg via ORAL
  Filled 2012-09-16 (×5): qty 1

## 2012-09-16 MED ORDER — SORBITOL 70 % SOLN
30.0000 mL | Freq: Every day | Status: DC | PRN
  Filled 2012-09-16: qty 30

## 2012-09-16 MED ORDER — LEVOTHYROXINE SODIUM 50 MCG PO TABS
50.0000 ug | ORAL_TABLET | Freq: Every day | ORAL | Status: DC
  Administered 2012-09-17 – 2012-09-20 (×4): 50 ug via ORAL
  Filled 2012-09-16 (×5): qty 1

## 2012-09-16 NOTE — ED Notes (Addendum)
Pt presents to department via [facility] for evaluation of fall and R leg pain. States she was adjusting thermostat last night when R knee "gave way" and she fell to ground. States she stayed on floor all night long. R leg shortened and noted to be rotated outward. Swelling noted above knee. She is conscious alert and oriented x4. 20g L hand. 9/10 pain at the time.

## 2012-09-16 NOTE — Progress Notes (Signed)
Orthopedic Tech Progress Note Patient Details:  Deborah Cooke Oct 14, 1950 [id number] OHF applied to patient's bed and adjusted to patient's comfort. Nurse in room at the time.  Patient ID: Deborah Cooke, female   DOB: [birth year], 61 y.o.   MRN: [id number]   [name] 09/16/2012, 6:41 PM

## 2012-09-16 NOTE — ED Provider Notes (Signed)
History     CSN: [id number]  Arrival date & time 09/16/12  1044   First MD Initiated Contact with Patient 09/16/12 1049      Chief Complaint  Patient presents with  . Leg Pain  . Fall    (Consider location/radiation/quality/duration/timing/severity/associated sxs/prior treatment) HPI Ortho- Thurston Hole  This 61 year old female felt her right knee gave way suddenly and fell to the ground from standing position just prior to arrival with swelling pain and deformity according to EMS to the patient's distal right thigh area with no distal weakness or numbness to her foot no color change to her foot no head or neck injury no amnesia no neck pain no back pain no chest pain no shortness breath no abdominal pain and no injury to her arms or left leg. She is isolated pain to the right distal thigh only with no pain to the right hip. She is unable to walk prior to arrival. She received fentanyl IV prior to arrival from EMS and does not want additional pain medicine upon arrival to the emergency room. Her last oral intake was a tiny sip of cola early this morning without food. Her pain was severe prior to arrival and is now controlled upon arrival. Past Medical History  Diagnosis Date  . Hypertension   . Thyroid disease   . Osteoporosis   . Headache   . Anxiety   . Adverse effect of unspecified anesthetic, initial encounter 08/2011    low o2 sats.  . Adverse effect of anesthetic 01/2012    difficulty waking up.    Past Surgical History  Procedure Date  . Wrist fracture surgery 08/2011  . Cholecystectomy 10/1975  . Gastric bypass 2003  . Replacement total knee 2003    right and left -4 months aoart.  . Joint replacement   . Tonsillectomy     No family history on file.  History  Substance Use Topics  . Smoking status: Never Smoker   . Smokeless tobacco: Former Neurosurgeon    Quit date: 01/17/1989  . Alcohol Use: No    OB History    Grav Para Term Preterm Abortions TAB SAB Ect Mult Living                   Review of Systems 10 Systems reviewed and are negative for acute change except as noted in the HPI. Allergies  Nsaids  Home Medications  No current outpatient prescriptions on file.  BP 121/67  Pulse 88  Temp 98 F (36.7 C) (Oral)  Resp 18  SpO2 98%  Physical Exam  Nursing note and vitals reviewed. Constitutional:       Awake, alert, nontoxic appearance.  HENT:  Head: Atraumatic.  Eyes: Right eye exhibits no discharge. Left eye exhibits no discharge.  Neck: Neck supple.       Cervical spine nontender  Cardiovascular: Normal rate and regular rhythm.   No murmur heard. Pulmonary/Chest: Effort normal and breath sounds normal. No respiratory distress. She has no wheezes. She has no rales. She exhibits no tenderness.  Abdominal: Soft. Bowel sounds are normal. She exhibits no distension. There is no tenderness. There is no rebound.  Musculoskeletal: She exhibits tenderness. She exhibits no edema.       Baseline ROM arms and left leg, no obvious new focal weakness except right knee and hip due to the right thigh pain. Right leg is nontender at the hip ankle and foot with dorsalis pedis pulse intact capillary refill less than 2  seconds normal light touch and good dorsiflexion and plantar flexion of the right foot. She however has tenderness to her right distal thigh just proximal to her knee. Skin appears intact.  Neurological: She is alert.       Mental status and motor strength appears baseline for patient and situation.  Skin: No rash noted.  Psychiatric: She has a normal mood and affect.    ED Course  Procedures (including critical care time) Patient / Family / Caregiver understand and agree with initial ED impression and plan with expectations set for ED visit.Pt stable in ED with no significant deterioration in condition. Labs Reviewed  CBC WITH DIFFERENTIAL - Abnormal; Notable for the following:    HCT 35.7 (*)     Neutrophils Relative 78 (*)     All  other components within normal limits  POCT I-STAT, CHEM 8 - Abnormal; Notable for the following:    Glucose, Bld 110 (*)     Calcium, Ion 1.12 (*)     All other components within normal limits  TYPE AND SCREEN   Dg Chest 1 View  09/16/2012  *RADIOLOGY REPORT*  Clinical Data: Preop for right knee surgery.  CHEST - 1 VIEW  Comparison: 09/16/2009  Findings: Lower cervical spine fixation.  Surgical clips at the region of the gastroesophageal junction.  Irregularity of the proximal right humerus is favored to be projectional or degenerative.  Mild cardiomegaly with tortuous descending thoracic aorta. No pleural effusion or pneumothorax.  No congestive failure.  Mild nonspecific and chronic interstitial thickening.  Mild scarring or atelectasis at the left lung base.  IMPRESSION: Cardiomegaly chronic interstitial thickening. No acute findings.   Original Report Authenticated By: Jeronimo Greaves, M.D.    Dg Femur Right  09/16/2012  *RADIOLOGY REPORT*  Clinical Data: Fall.  Leg pain.  RIGHT FEMUR - 2 VIEW  Comparison: None.  Findings: The patient is status post right knee replacement.  There is a nondisplaced, mildly comminuted fracture through the distal right femur just above the knee replacement.  Suspect a moderate-to- large joint effusion within the right knee although the lateral view is somewhat obliqued due to traction device.  No proximal femoral abnormality.  IMPRESSION: Mildly comminuted, nondisplaced distal right femoral fracture. Right knee replacement.   Original Report Authenticated By: Charlett Nose, M.D.    Dg Knee 1-2 Views Right  09/16/2012  *RADIOLOGY REPORT*  Clinical Data: Fall.  Knee pain.  RIGHT KNEE - 1-2 VIEW  Comparison: Right femoral series performed today.  Findings: The  there is a mildly comminuted, essentially nondisplaced fracture through the distal right femur.  Suspect moderate to large right joint effusion although the lateral view is suboptimal lead position due to traction  device.  Remote right knee replacement changes.  IMPRESSION: Nondisplaced mildly comminuted distal right femoral fracture.   Original Report Authenticated By: Charlett Nose, M.D.      1. Closed femur fracture       MDM  D/w Ortho for admit.        Hurman Horn, MD 09/16/12 2059

## 2012-09-16 NOTE — ED Notes (Signed)
Patient admitted to [id number] report called to [facility] RN

## 2012-09-16 NOTE — Progress Notes (Signed)
Orthopedic Tech Progress Note Patient Details:  Deborah Cooke [birth year] [id number]  Ortho Devices Type of Ortho Device: Knee Immobilizer Ortho Device/Splint Location: KNEE IMMOBILIZER  Ortho Device/Splint Interventions: Application   Shawnie Pons 09/16/2012, 1:58 PM

## 2012-09-16 NOTE — ED Notes (Signed)
Pain has improved.  

## 2012-09-16 NOTE — ED Notes (Signed)
Pt given a coke to drink.

## 2012-09-16 NOTE — ED Notes (Signed)
Patient transported to X-ray 

## 2012-09-16 NOTE — H&P (Signed)
Deborah Cooke is an 61 y.o. female.   Chief Complaint: right periprosthetic distal femur fracture HPI: Patient is a 61yo female, patient of Dr. Thurston Hole who has done bilateral knee replacements she believes right in 2005 and left 2006, history of gastric bypass, cervical fusions, on chronic pain management for headaches throught Neurologist Dr. Rich Number with [facility], PCP [facility].  She was doing well with her knee replacements no problems, ambulates without gait aid.  She sustained a fall approx 11pm last night in her bedroom slept on the floor called a family member this morning, feels the right knee gave out, landing directly on the right knee.  EMS called she was placed into traction and brought into the [facility] ED where xrays revealed a nondisplaced comminuted periprosthetic right distal femur fracture.  She denies any other pain or injury, denies LOC.    Past Medical History  Diagnosis Date  . Hypertension   . Thyroid disease   . Osteoporosis     Surgical history: Gastric bypass 2003 Cervical fusions bilat knee replacements  No family history on file. Social History:  reports that she has never smoked. She does not have any smokeless tobacco history on file. She reports that she does not drink alcohol or use illicit drugs.  Allergies:  Allergies  Allergen Reactions  . Nsaids      (Not in a hospital admission)  Results for orders placed during the hospital encounter of 09/16/12 (from the past 48 hour(s))  CBC WITH DIFFERENTIAL     Status: Abnormal   Collection Time   09/16/12 10:59 AM      Component Value Range Comment   WBC 9.0  4.0 - 10.5 K/uL    RBC 4.11  3.87 - 5.11 MIL/uL    Hemoglobin 12.2  12.0 - 15.0 g/dL    HCT 96.2 (*) 95.2 - 46.0 %    MCV 86.9  78.0 - 100.0 fL    MCH 29.7  26.0 - 34.0 pg    MCHC 34.2  30.0 - 36.0 g/dL    RDW 84.1  32.4 - 40.1 %    Platelets 306  150 - 400 K/uL    Neutrophils Relative 78 (*) 43 - 77 %    Neutro Abs 7.0   1.7 - 7.7 K/uL    Lymphocytes Relative 12  12 - 46 %    Lymphs Abs 1.1  0.7 - 4.0 K/uL    Monocytes Relative 10  3 - 12 %    Monocytes Absolute 0.9  0.1 - 1.0 K/uL    Eosinophils Relative 0  0 - 5 %    Eosinophils Absolute 0.0  0.0 - 0.7 K/uL    Basophils Relative 0  0 - 1 %    Basophils Absolute 0.0  0.0 - 0.1 K/uL   TYPE AND SCREEN     Status: Normal   Collection Time   09/16/12 11:00 AM      Component Value Range Comment   ABO/RH(D) A POS      Antibody Screen NEG      Sample Expiration 09/19/2012     POCT I-STAT, CHEM 8     Status: Abnormal   Collection Time   09/16/12 11:27 AM      Component Value Range Comment   Sodium 135  135 - 145 mEq/L    Potassium 4.5  3.5 - 5.1 mEq/L    Chloride 102  96 - 112 mEq/L    BUN  18  6 - 23 mg/dL    Creatinine, Ser 1.61  0.50 - 1.10 mg/dL    Glucose, Bld 096 (*) 70 - 99 mg/dL    Calcium, Ion 0.45 (*) 1.13 - 1.30 mmol/L    TCO2 28  0 - 100 mmol/L    Hemoglobin 12.2  12.0 - 15.0 g/dL    HCT 40.9  81.1 - 91.4 %    Dg Chest 1 View  09/16/2012  *RADIOLOGY REPORT*  Clinical Data: Preop for right knee surgery.  CHEST - 1 VIEW  Comparison: 09/16/2009  Findings: Lower cervical spine fixation.  Surgical clips at the region of the gastroesophageal junction.  Irregularity of the proximal right humerus is favored to be projectional or degenerative.  Mild cardiomegaly with tortuous descending thoracic aorta. No pleural effusion or pneumothorax.  No congestive failure.  Mild nonspecific and chronic interstitial thickening.  Mild scarring or atelectasis at the left lung base.  IMPRESSION: Cardiomegaly chronic interstitial thickening. No acute findings.   Original Report Authenticated By: Jeronimo Greaves, M.D.    Dg Femur Right  09/16/2012  *RADIOLOGY REPORT*  Clinical Data: Fall.  Leg pain.  RIGHT FEMUR - 2 VIEW  Comparison: None.  Findings: The patient is status post right knee replacement.  There is a nondisplaced, mildly comminuted fracture through the distal  right femur just above the knee replacement.  Suspect a moderate-to- large joint effusion within the right knee although the lateral view is somewhat obliqued due to traction device.  No proximal femoral abnormality.  IMPRESSION: Mildly comminuted, nondisplaced distal right femoral fracture. Right knee replacement.   Original Report Authenticated By: Charlett Nose, M.D.    Dg Knee 1-2 Views Right  09/16/2012  *RADIOLOGY REPORT*  Clinical Data: Fall.  Knee pain.  RIGHT KNEE - 1-2 VIEW  Comparison: Right femoral series performed today.  Findings: The  there is a mildly comminuted, essentially nondisplaced fracture through the distal right femur.  Suspect moderate to large right joint effusion although the lateral view is suboptimal lead position due to traction device.  Remote right knee replacement changes.  IMPRESSION: Nondisplaced mildly comminuted distal right femoral fracture.   Original Report Authenticated By: Charlett Nose, M.D.     Review of Systems  Constitutional: Negative.   HENT: Positive for neck pain. Negative for hearing loss, ear pain, nosebleeds, congestion, sore throat, tinnitus and ear discharge.   Eyes: Negative for blurred vision, double vision, photophobia, pain, discharge and redness.  Respiratory: Negative for cough, hemoptysis, sputum production, shortness of breath, wheezing and stridor.   Cardiovascular: Negative for chest pain, palpitations, orthopnea, claudication, leg swelling and PND.  Gastrointestinal: Negative for heartburn, nausea, vomiting, abdominal pain, diarrhea, constipation, blood in stool and melena.  Genitourinary: Negative for dysuria, urgency, frequency, hematuria and flank pain.  Musculoskeletal: Positive for myalgias, back pain, joint pain and falls.  Skin: Negative.   Neurological: Positive for tingling and headaches. Negative for dizziness, tremors, sensory change, speech change, focal weakness, seizures and loss of consciousness.  Endo/Heme/Allergies:  Negative.   Psychiatric/Behavioral: Positive for depression. Negative for substance abuse. The patient is nervous/anxious. The patient does not have insomnia.     Blood pressure 137/72, pulse 80, temperature 98.6 F (37 C), temperature source Oral, resp. rate 16, SpO2 93.00%. Physical Exam  Constitutional: She is oriented to person, place, and time. She appears well-developed and well-nourished.  HENT:  Head: Normocephalic and atraumatic.  Nose: Nose normal.  Eyes: Conjunctivae normal and EOM are normal. Pupils are equal, round,  and reactive to light.  Neck: Normal range of motion. Neck supple.  Cardiovascular: Normal rate, regular rhythm and intact distal pulses.   No murmur heard. Respiratory: Effort normal and breath sounds normal. No respiratory distress. She has no wheezes. She exhibits no tenderness.  GI: Soft. Bowel sounds are normal. She exhibits no distension. There is no tenderness. There is no guarding.  Musculoskeletal:       Right knee: She exhibits decreased range of motion, swelling and bony tenderness. She exhibits no ecchymosis, no deformity, no laceration, no erythema and normal alignment. tenderness found.       Secondary exam, head/neck/upper extremities/LLE/right hip and ankle negative for tenderness, good ROM  Lymphadenopathy:    She has no cervical adenopathy.  Neurological: She is alert and oriented to person, place, and time. No cranial nerve deficit.  Skin: Skin is warm and dry. No rash noted. She is not diaphoretic. No erythema. No pallor.  Psychiatric: She has a normal mood and affect. Her behavior is normal.     Assessment/Plan Nondisplaced periprosthetic right distal femur fracture following a fall at home  Xrays reviewed by Dr. Madelon Lips and myself due to the nondisplaced nature of the fracture may elect to treat this nonoperatively, will discuss further with the orthopaedic team.  Admit for pain control and PT, 1-2 night admission.  Bedrest tonight, pain  management, dvt prophylaxis with lovenox, repeat films tomorrow morning femur and knee, if appears stable PT/OT consults, possible d/c home.    Margart Sickles 09/16/2012, 1:41 PM

## 2012-09-17 ENCOUNTER — Observation Stay (HOSPITAL_COMMUNITY): Payer: Medicare Other

## 2012-09-17 NOTE — Progress Notes (Signed)
Utilization review completed.  

## 2012-09-17 NOTE — Progress Notes (Signed)
I have been contacted by Dr. Madelon Lips regarding surgical options.   I have reviewed the x-rays, and spoken with the patient over the phone, and discussed the R/B/A and she has elected for surgical management with internal fixation to minimize risk for malunion, nonunion, and optimize mobility and pain control.  Currently totally unable to transfer or move in bed.  Will plan for surgical intervention tomorrow 12/30, probably 4pm at the earliest based on current schedule.  The risks benefits and alternatives were discussed with the patient including but not limited to the risks of nonoperative treatment, versus surgical intervention including infection, bleeding, nerve injury, malunion, nonunion, the need for revision surgery, hardware prominence, hardware failure, the need for hardware removal, blood clots, cardiopulmonary complications, morbidity, mortality, among others, and they were willing to proceed.    Eulas Post, MD

## 2012-09-17 NOTE — Progress Notes (Signed)
Subjective:    right nondisplaced comminuted periprosthetic distal femur fracture DOI 09/15/12 Patient reports pain as mild, moderate and severe.  Painful with any movement.    Objective: Vital signs in last 24 hours: Temp:  [98 F (36.7 C)-98.6 F (37 C)] 98.4 F (36.9 C) (12/29 0426) Pulse Rate:  [76-88] 76  (12/29 0426) Resp:  [14-20] 20  (12/29 0426) BP: (112-147)/(65-106) 112/71 mmHg (12/29 0426) SpO2:  [92 %-100 %] 99 % (12/29 0426)  Intake/Output from previous day: 12/28 0701 - 12/29 0700 In: -  Out: 1650 [Urine:1650] Intake/Output this shift:     Basename 09/16/12 1127 09/16/12 1059  HGB 12.2 12.2    Basename 09/16/12 1127 09/16/12 1059  WBC -- 9.0  RBC -- 4.11  HCT 36.0 35.7*  PLT -- 306    Basename 09/16/12 1127  NA 135  K 4.5  CL 102  CO2 --  BUN 18  CREATININE 1.00  GLUCOSE 110*  CALCIUM --   No results found for this basename: LABPT:2,INR:2 in the last 72 hours  Neurovascular intact Sensation intact distally Intact pulses distally Dorsiflexion/Plantar flexion intact Compartment soft  Imaging: Repeat films femur and knee from this morning show minimal displacement of the fracture compared to films taken yesterday in traction.  Assessment/Plan:     Continue pain control, home meds and breakthrough meds Continue DVT proph lovenox Continue knee immobilizer, NWB RLE, bedrest, may advance bed to chair transfers but will discuss with ortho team first Continue foley catheter Ortho MD will discuss operative vs nonoperative management of her fracture today     Margart Sickles 09/17/2012, 9:17 AM

## 2012-09-18 ENCOUNTER — Inpatient Hospital Stay (HOSPITAL_COMMUNITY): Payer: Medicare Other | Admitting: Certified Registered"

## 2012-09-18 ENCOUNTER — Inpatient Hospital Stay (HOSPITAL_COMMUNITY): Payer: Medicare Other

## 2012-09-18 ENCOUNTER — Encounter (HOSPITAL_COMMUNITY): Payer: Self-pay | Admitting: Certified Registered"

## 2012-09-18 ENCOUNTER — Encounter (HOSPITAL_COMMUNITY)
Admission: EM | Disposition: A | Discharge status: Home Health | Payer: Self-pay | Source: Home / Self Care | Attending: Orthopedic Surgery

## 2012-09-18 ENCOUNTER — Encounter (HOSPITAL_COMMUNITY): Payer: Self-pay | Admitting: Orthopedic Surgery

## 2012-09-18 DIAGNOSIS — S72451A Displaced supracondylar fracture without intracondylar extension of lower end of right femur, initial encounter for closed fracture: Secondary | ICD-10-CM

## 2012-09-18 HISTORY — DX: Displaced supracondylar fracture without intracondylar extension of lower end of right femur, initial encounter for closed fracture: S72.451A

## 2012-09-18 HISTORY — PX: FEMUR IM NAIL: SHX1597

## 2012-09-18 LAB — GRAM STAIN

## 2012-09-18 LAB — SURGICAL PCR SCREEN
MRSA, PCR: NEGATIVE
Staphylococcus aureus: POSITIVE — AB

## 2012-09-18 SURGERY — INSERTION, INTRAMEDULLARY ROD, FEMUR, RETROGRADE
Anesthesia: General | Site: Leg Upper | Laterality: Right | Wound class: Clean

## 2012-09-18 SURGERY — OPEN REDUCTION INTERNAL FIXATION (ORIF) DISTAL FEMUR FRACTURE
Anesthesia: General | Laterality: Right

## 2012-09-18 MED ORDER — NEOSTIGMINE METHYLSULFATE 1 MG/ML IJ SOLN
INTRAMUSCULAR | Status: DC | PRN
  Administered 2012-09-18: 3 mg via INTRAVENOUS

## 2012-09-18 MED ORDER — ENOXAPARIN SODIUM 40 MG/0.4ML ~~LOC~~ SOLN
40.0000 mg | SUBCUTANEOUS | Status: DC
  Administered 2012-09-19 – 2012-09-20 (×2): 40 mg via SUBCUTANEOUS
  Filled 2012-09-18 (×3): qty 0.4

## 2012-09-18 MED ORDER — ACETAMINOPHEN 325 MG PO TABS: 650.0000 mg | ORAL_TABLET | Freq: Four times a day (QID) | ORAL | Status: DC | PRN

## 2012-09-18 MED ORDER — MENTHOL 3 MG MT LOZG
1.0000 | LOZENGE | OROMUCOSAL | Status: DC | PRN
  Filled 2012-09-18: qty 9

## 2012-09-18 MED ORDER — FUROSEMIDE 20 MG PO TABS
20.0000 mg | ORAL_TABLET | Freq: Every day | ORAL | Status: DC
Start: 2012-09-18 — End: 2012-09-20
  Administered 2012-09-19 – 2012-09-20 (×2): 20 mg via ORAL
  Filled 2012-09-18 (×3): qty 1

## 2012-09-18 MED ORDER — LIDOCAINE HCL (CARDIAC) 20 MG/ML IV SOLN
INTRAVENOUS | Status: DC | PRN
  Administered 2012-09-18: 60 mg via INTRAVENOUS

## 2012-09-18 MED ORDER — HYDROMORPHONE HCL PF 1 MG/ML IJ SOLN
1.0000 mg | INTRAMUSCULAR | Status: DC | PRN
  Administered 2012-09-18 – 2012-09-20 (×12): 1 mg via INTRAVENOUS
  Filled 2012-09-18 (×12): qty 1

## 2012-09-18 MED ORDER — METOCLOPRAMIDE HCL 10 MG PO TABS: 5.0000 mg | ORAL_TABLET | Freq: Three times a day (TID) | ORAL | Status: DC | PRN

## 2012-09-18 MED ORDER — BISACODYL 10 MG RE SUPP: 10.0000 mg | Freq: Every day | RECTAL | Status: DC | PRN

## 2012-09-18 MED ORDER — ROCURONIUM BROMIDE 100 MG/10ML IV SOLN
INTRAVENOUS | Status: DC | PRN
  Administered 2012-09-18: 20 mg via INTRAVENOUS
  Administered 2012-09-18: 10 mg via INTRAVENOUS
  Administered 2012-09-18: 50 mg via INTRAVENOUS

## 2012-09-18 MED ORDER — GLYCOPYRROLATE 0.2 MG/ML IJ SOLN
INTRAMUSCULAR | Status: DC | PRN
  Administered 2012-09-18: 0.4 mg via INTRAVENOUS

## 2012-09-18 MED ORDER — OXYCODONE HCL 5 MG/5ML PO SOLN: 5.0000 mg | Freq: Once | ORAL | Status: DC | PRN

## 2012-09-18 MED ORDER — CEFAZOLIN SODIUM-DEXTROSE 2-3 GM-% IV SOLR
INTRAVENOUS | Status: AC
  Filled 2012-09-18: qty 50

## 2012-09-18 MED ORDER — METOCLOPRAMIDE HCL 5 MG/ML IJ SOLN
10.0000 mg | Freq: Once | INTRAMUSCULAR | Status: DC | PRN
Start: 2012-09-18 — End: 2012-09-18

## 2012-09-18 MED ORDER — OXYCODONE HCL 5 MG PO TABS: 5.0000 mg | ORAL_TABLET | ORAL | Status: DC | PRN

## 2012-09-18 MED ORDER — SENNA-DOCUSATE SODIUM 8.6-50 MG PO TABS: 1.0000 | ORAL_TABLET | Freq: Every day | ORAL | Status: DC

## 2012-09-18 MED ORDER — METOCLOPRAMIDE HCL 5 MG/ML IJ SOLN
5.0000 mg | Freq: Three times a day (TID) | INTRAMUSCULAR | Status: DC | PRN
  Filled 2012-09-18: qty 2

## 2012-09-18 MED ORDER — ENOXAPARIN SODIUM 40 MG/0.4ML ~~LOC~~ SOLN: 40.0000 mg | SUBCUTANEOUS | Status: DC

## 2012-09-18 MED ORDER — CEFAZOLIN SODIUM-DEXTROSE 2-3 GM-% IV SOLR
2.0000 g | Freq: Four times a day (QID) | INTRAVENOUS | Status: AC
  Administered 2012-09-18 – 2012-09-19 (×2): 2 g via INTRAVENOUS
  Filled 2012-09-18 (×2): qty 50

## 2012-09-18 MED ORDER — ACETAMINOPHEN 10 MG/ML IV SOLN
1000.0000 mg | Freq: Once | INTRAVENOUS | Status: DC
  Filled 2012-09-18: qty 100

## 2012-09-18 MED ORDER — LABETALOL HCL 5 MG/ML IV SOLN
INTRAVENOUS | Status: DC | PRN
  Administered 2012-09-18 (×4): 5 mg via INTRAVENOUS

## 2012-09-18 MED ORDER — ACETAMINOPHEN 650 MG RE SUPP: 650.0000 mg | Freq: Four times a day (QID) | RECTAL | Status: DC | PRN

## 2012-09-18 MED ORDER — PROPOFOL 10 MG/ML IV BOLUS
INTRAVENOUS | Status: DC | PRN
  Administered 2012-09-18: 120 mg via INTRAVENOUS

## 2012-09-18 MED ORDER — LACTATED RINGERS IV SOLN
INTRAVENOUS | Status: DC | PRN
  Administered 2012-09-18 (×2): via INTRAVENOUS

## 2012-09-18 MED ORDER — DOCUSATE SODIUM 100 MG PO CAPS
100.0000 mg | ORAL_CAPSULE | Freq: Two times a day (BID) | ORAL | Status: DC
  Administered 2012-09-18 – 2012-09-20 (×4): 100 mg via ORAL
  Filled 2012-09-18 (×5): qty 1

## 2012-09-18 MED ORDER — CEFAZOLIN SODIUM-DEXTROSE 2-3 GM-% IV SOLR
INTRAVENOUS | Status: DC | PRN
  Administered 2012-09-18: 2 g via INTRAVENOUS

## 2012-09-18 MED ORDER — SENNA 8.6 MG PO TABS
1.0000 | ORAL_TABLET | Freq: Two times a day (BID) | ORAL | Status: DC
  Administered 2012-09-18 – 2012-09-20 (×4): 8.6 mg via ORAL
  Filled 2012-09-18 (×5): qty 1

## 2012-09-18 MED ORDER — HYDROMORPHONE HCL PF 1 MG/ML IJ SOLN
INTRAMUSCULAR | Status: AC
  Filled 2012-09-18: qty 1

## 2012-09-18 MED ORDER — CEFAZOLIN SODIUM-DEXTROSE 2-3 GM-% IV SOLR
2.0000 g | INTRAVENOUS | Status: DC
  Filled 2012-09-18: qty 50

## 2012-09-18 MED ORDER — PHENOL 1.4 % MT LIQD
1.0000 | OROMUCOSAL | Status: DC | PRN
  Filled 2012-09-18: qty 177

## 2012-09-18 MED ORDER — ALUM & MAG HYDROXIDE-SIMETH 200-200-20 MG/5ML PO SUSP: 30.0000 mL | ORAL | Status: DC | PRN

## 2012-09-18 MED ORDER — LACTATED RINGERS IV SOLN
INTRAVENOUS | Status: DC
  Administered 2012-09-18: 17:00:00 via INTRAVENOUS

## 2012-09-18 MED ORDER — HYDROMORPHONE HCL PF 1 MG/ML IJ SOLN
0.2500 mg | INTRAMUSCULAR | Status: DC | PRN
  Administered 2012-09-18 (×4): 0.5 mg via INTRAVENOUS

## 2012-09-18 MED ORDER — METHOCARBAMOL 100 MG/ML IJ SOLN: 500.0000 mg | Freq: Four times a day (QID) | INTRAVENOUS | Status: DC | PRN

## 2012-09-18 MED ORDER — OXYCODONE HCL 5 MG PO TABS: 5.0000 mg | ORAL_TABLET | Freq: Once | ORAL | Status: DC | PRN

## 2012-09-18 MED ORDER — ACETAMINOPHEN 10 MG/ML IV SOLN
INTRAVENOUS | Status: DC | PRN
  Administered 2012-09-18: 1000 mg via INTRAVENOUS

## 2012-09-18 MED ORDER — POLYETHYLENE GLYCOL 3350 17 G PO PACK
17.0000 g | PACK | Freq: Every day | ORAL | Status: DC | PRN
  Filled 2012-09-18: qty 1

## 2012-09-18 MED ORDER — METHOCARBAMOL 500 MG PO TABS: 500.0000 mg | ORAL_TABLET | Freq: Four times a day (QID) | ORAL | Status: DC | PRN

## 2012-09-18 MED ORDER — ACETAMINOPHEN 10 MG/ML IV SOLN
INTRAVENOUS | Status: AC
  Filled 2012-09-18: qty 100

## 2012-09-18 MED ORDER — POTASSIUM CHLORIDE IN NACL 20-0.45 MEQ/L-% IV SOLN
INTRAVENOUS | Status: DC
  Administered 2012-09-18 – 2012-09-20 (×3): via INTRAVENOUS
  Filled 2012-09-18 (×4): qty 1000

## 2012-09-18 MED ORDER — ONDANSETRON HCL 4 MG PO TABS: 4.0000 mg | ORAL_TABLET | Freq: Four times a day (QID) | ORAL | Status: DC | PRN

## 2012-09-18 MED ORDER — ONDANSETRON HCL 4 MG/2ML IJ SOLN: 4.0000 mg | Freq: Four times a day (QID) | INTRAMUSCULAR | Status: DC | PRN

## 2012-09-18 MED ORDER — 0.9 % SODIUM CHLORIDE (POUR BTL) OPTIME
TOPICAL | Status: DC | PRN
  Administered 2012-09-18: 1000 mL

## 2012-09-18 MED ORDER — HYDROMORPHONE HCL 2 MG PO TABS: 2.0000 mg | ORAL_TABLET | ORAL | Status: DC | PRN

## 2012-09-18 MED ORDER — FENTANYL CITRATE 0.05 MG/ML IJ SOLN
INTRAMUSCULAR | Status: DC | PRN
  Administered 2012-09-18: 100 ug via INTRAVENOUS
  Administered 2012-09-18 (×3): 50 ug via INTRAVENOUS

## 2012-09-18 MED ORDER — MIDAZOLAM HCL 5 MG/5ML IJ SOLN
INTRAMUSCULAR | Status: DC | PRN
  Administered 2012-09-18: 2 mg via INTRAVENOUS

## 2012-09-18 MED ORDER — OXYCODONE-ACETAMINOPHEN 10-325 MG PO TABS: 1.0000 | ORAL_TABLET | Freq: Four times a day (QID) | ORAL | Status: DC | PRN

## 2012-09-18 SURGICAL SUPPLY — 79 items
3.2MM X 460MM COCR THD TIP WIRE ×1 IMPLANT
APL SKNCLS STERI-STRIP NONHPOA (GAUZE/BANDAGES/DRESSINGS) ×1
BANDAGE ELASTIC 4 VELCRO ST LF (GAUZE/BANDAGES/DRESSINGS) ×1 IMPLANT
BANDAGE ELASTIC 6 VELCRO ST LF (GAUZE/BANDAGES/DRESSINGS) ×2 IMPLANT
BANDAGE ESMARK 6X9 LF (GAUZE/BANDAGES/DRESSINGS) IMPLANT
BANDAGE GAUZE ELAST BULKY 4 IN (GAUZE/BANDAGES/DRESSINGS) ×2 IMPLANT
BENZOIN TINCTURE PRP APPL 2/3 (GAUZE/BANDAGES/DRESSINGS) ×1 IMPLANT
BIT DRILL CALIBRATED 4.3MMX365 (DRILL) IMPLANT
BIT DRILL CROWE PNT TWST 4.5MM (DRILL) IMPLANT
BLADE SURG 15 STRL LF DISP TIS (BLADE) ×1 IMPLANT
BLADE SURG 15 STRL SS (BLADE) ×2
BLADE SURG ROTATE 9660 (MISCELLANEOUS) IMPLANT
BNDG CMPR 9X6 STRL LF SNTH (GAUZE/BANDAGES/DRESSINGS)
BNDG COHESIVE 6X5 TAN STRL LF (GAUZE/BANDAGES/DRESSINGS) ×2 IMPLANT
BNDG ESMARK 6X9 LF (GAUZE/BANDAGES/DRESSINGS)
BOOTCOVER CLEANROOM LRG (PROTECTIVE WEAR) ×4 IMPLANT
CATH URET WHISTLE 8FR 331008 (CATHETERS) ×1 IMPLANT
CLOTH BEACON ORANGE TIMEOUT ST (SAFETY) ×2 IMPLANT
CLSR STERI-STRIP ANTIMIC 1/2X4 (GAUZE/BANDAGES/DRESSINGS) ×1 IMPLANT
COVER SURGICAL LIGHT HANDLE (MISCELLANEOUS) ×4 IMPLANT
CUFF TOURNIQUET SINGLE 34IN LL (TOURNIQUET CUFF) IMPLANT
CUFF TOURNIQUET SINGLE 44IN (TOURNIQUET CUFF) IMPLANT
DRAPE C-ARM 42X72 X-RAY (DRAPES) ×2 IMPLANT
DRAPE C-ARMOR (DRAPES) ×1 IMPLANT
DRAPE ORTHO SPLIT 77X108 STRL (DRAPES) ×4
DRAPE PROXIMA HALF (DRAPES) ×4 IMPLANT
DRAPE SURG ORHT 6 SPLT 77X108 (DRAPES) ×2 IMPLANT
DRAPE U-SHAPE 47X51 STRL (DRAPES) ×2 IMPLANT
DRILL CALIBRATED 4.3MMX365 (DRILL) ×2
DRILL CROWE POINT TWIST 4.5MM (DRILL) ×2
DRSG ADAPTIC 3X8 NADH LF (GAUZE/BANDAGES/DRESSINGS) ×1 IMPLANT
DRSG MEPILEX BORDER 4X4 (GAUZE/BANDAGES/DRESSINGS) ×1 IMPLANT
DRSG PAD ABDOMINAL 8X10 ST (GAUZE/BANDAGES/DRESSINGS) ×1 IMPLANT
DURAPREP 26ML APPLICATOR (WOUND CARE) ×2 IMPLANT
ELECT REM PT RETURN 9FT ADLT (ELECTROSURGICAL) ×2
ELECTRODE REM PT RTRN 9FT ADLT (ELECTROSURGICAL) ×1 IMPLANT
FACESHIELD LNG OPTICON STERILE (SAFETY) ×2 IMPLANT
GLOVE BIO SURGEON STRL SZ 6 (GLOVE) ×1 IMPLANT
GLOVE BIO SURGEON STRL SZ 6.5 (GLOVE) ×1 IMPLANT
GLOVE BIO SURGEONS STER SZ 5.5 (GLOVE) ×1 IMPLANT
GLOVE BIOGEL PI IND STRL 6.5 (GLOVE) IMPLANT
GLOVE BIOGEL PI IND STRL 7.0 (GLOVE) IMPLANT
GLOVE BIOGEL PI IND STRL 8 (GLOVE) ×1 IMPLANT
GLOVE BIOGEL PI INDICATOR 6.5 (GLOVE) ×2
GLOVE BIOGEL PI INDICATOR 7.0 (GLOVE) ×1
GLOVE BIOGEL PI INDICATOR 8 (GLOVE) ×1
GLOVE ORTHO TXT STRL SZ7.5 (GLOVE) ×2 IMPLANT
GLOVE SURG ORTHO 8.0 STRL STRW (GLOVE) ×4 IMPLANT
GOWN PREVENTION PLUS XLARGE (GOWN DISPOSABLE) ×1 IMPLANT
GOWN STRL NON-REIN LRG LVL3 (GOWN DISPOSABLE) IMPLANT
GUIDEWIRE BEAD TIP (WIRE) ×1 IMPLANT
KIT BASIN OR (CUSTOM PROCEDURE TRAY) ×2 IMPLANT
KIT ROOM TURNOVER OR (KITS) ×2 IMPLANT
MANIFOLD NEPTUNE II (INSTRUMENTS) ×2 IMPLANT
NAIL FEM RETRO 10.5X340 (Nail) ×1 IMPLANT
NEEDLE HYPO 22GX1.5 SAFETY (NEEDLE) ×1 IMPLANT
NS IRRIG 1000ML POUR BTL (IV SOLUTION) ×2 IMPLANT
PACK GENERAL/GYN (CUSTOM PROCEDURE TRAY) ×2 IMPLANT
PAD ARMBOARD 7.5X6 YLW CONV (MISCELLANEOUS) ×4 IMPLANT
PADDING CAST COTTON 6X4 STRL (CAST SUPPLIES) ×1 IMPLANT
SCREW CORT TI DBL LEAD 5X34 (Screw) ×1 IMPLANT
SCREW CORT TI DBL LEAD 5X65 (Screw) ×1 IMPLANT
SCREW CORT TI DBL LEAD 5X75 (Screw) ×1 IMPLANT
SPONGE GAUZE 4X4 12PLY (GAUZE/BANDAGES/DRESSINGS) ×3 IMPLANT
STAPLER VISISTAT 35W (STAPLE) ×1 IMPLANT
STOCKINETTE IMPERVIOUS LG (DRAPES) ×2 IMPLANT
SUT MNCRL AB 4-0 PS2 18 (SUTURE) ×2 IMPLANT
SUT VIC AB 0 CT1 18XCR BRD 8 (SUTURE) ×1 IMPLANT
SUT VIC AB 0 CT1 8-18 (SUTURE) ×2
SUT VIC AB 2-0 CT1 27 (SUTURE) ×4
SUT VIC AB 2-0 CT1 TAPERPNT 27 (SUTURE) ×1 IMPLANT
SUT VIC AB 3-0 SH 8-18 (SUTURE) ×3 IMPLANT
SWAB CULTURE LIQUID MINI MALE (MISCELLANEOUS) ×1 IMPLANT
SYRINGE 10CC LL (SYRINGE) ×1 IMPLANT
TOWEL OR 17X24 6PK STRL BLUE (TOWEL DISPOSABLE) ×2 IMPLANT
TOWEL OR 17X26 10 PK STRL BLUE (TOWEL DISPOSABLE) ×2 IMPLANT
TRAY FOLEY CATH 14FR (SET/KITS/TRAYS/PACK) IMPLANT
TUBE ANAEROBIC SPECIMEN COL (MISCELLANEOUS) ×1 IMPLANT
WATER STERILE IRR 1000ML POUR (IV SOLUTION) ×2 IMPLANT

## 2012-09-18 NOTE — Op Note (Signed)
09/16/2012 - 09/18/2012  7:08 PM  PATIENT:  Deborah Cooke    PRE-OPERATIVE DIAGNOSIS:  Right periprosthetic supracondylar femur fracture  POST-OPERATIVE DIAGNOSIS:  Same  PROCEDURE:  INTRAMEDULLARY (IM) RETROGRADE FEMORAL NAILING supracondylar femur fracture  SURGEON:  Eulas Post, MD  PHYSICIAN ASSISTANT: Janace Litten, OPA-C, present and scrubbed throughout the case, critical for completion in a timely fashion, and for retraction, instrumentation, and closure.  ANESTHESIA:   General  PREOPERATIVE INDICATIONS:  Deborah Cooke is a  61 y.o. female with a diagnosis of right periprosthetic supracondylar femur fracture after a fall, who elected for surgical management.    The risks benefits and alternatives were discussed with the patient preoperatively including but not limited to the risks of infection, bleeding, nerve injury, cardiopulmonary complications, the need for revision surgery, among others, and the patient was willing to proceed. We also discussed the risks for malunion, nonunion, the need for revision surgery, blood clots, among others.   OPERATIVE IMPLANTS: Biomet retrograde [place] femoral nail with 2 distal locking screws and one proximal interlocking bolt. The nail size was 10.5 x 340 mm.  OPERATIVE FINDINGS: There was some abnormal appearing fluid within the knee joint, that was somewhat turbid, which was sent for Gram stain, culture, and sensitivity, and is pending. I didn't have any clinical suspicion for infection, however we will await the results of the culture. The lining of the knee joint, and the surrounding tissue all appeared normal, and I did not see any loosening of the prosthesis either on the tibial or femoral side. The polyethylene was in reasonably good condition. The bone quality was extraordinarily poor. The fracture was extremely distal, only amenable to the 2 distalmost screws, as more proximally the screws were not distal to the fracture.  OPERATIVE  PROCEDURE: The patient is brought to the operating room and placed in the supine position. General anesthesia was administered. IV antibiotics were given. The right lower extremity was prepped and draped in usual sterile fashion. Incision was made over her previous incision at her total knee scar. This was exposed, and a medial parapatellar arthrotomy carried out along the previous suture line. It was at this point that I encountered some abnormal-appearing fluid, however I think some of this was may or elements, and it was not clearly pus. I did however send this for cell count, Gram stain, culture and sensitivity.  I removed the plastic plug in the femoral prosthesis, and then introduced a guidewire into the center center position. This was confirmed on AP and lateral views. I then opened the distal femur with the reamer, and then placed a long guidewire. I measured the length of the femur and it measured to be a size 340 mm. I reamed, up to a 12, and got a small amount of chatter, and selected a size 10.5 nail. I did not feel that a larger diameter nail would offer any additional stability to the fracture site.  I then placed the nail and impacted this gently into position. I stopped this exactly at the level of the prosthesis, in order to get some degree of interference fit of the nail to the rim of the prosthesis.  I then used the jig to place 2 distal screws, one in a transverse hole and one in the oblique hole. The 2 more proximal screws were too proximal for fixation, and were not in the distal segment and so therefore were not placed. I locked the nail distally, and then removed the jig. I  then placed a single interlocking bolts proximally through the static position.  Satisfactory overall fixation was achieved, however the fracture is extremely low, and bone quality extremely poor, and I am going to recommend nonweightbearing for a period of 8 weeks. She tolerated the procedure well and there were  no complications.

## 2012-09-18 NOTE — Progress Notes (Signed)
Report to Shelly RN as caregiver 

## 2012-09-18 NOTE — Transfer of Care (Signed)
Immediate Anesthesia Transfer of Care Note  Patient: Deborah Cooke  Procedure(s) Performed: Procedure(s) (LRB) with comments: INTRAMEDULLARY (IM) RETROGRADE FEMORAL NAILING (Right)  Patient Location: PACU  Anesthesia Type:General  Level of Consciousness: awake, alert , oriented and patient cooperative  Airway & Oxygen Therapy: Patient Spontanous Breathing and Patient connected to nasal cannula oxygen  Post-op Assessment: Report given to PACU RN, Post -op Vital signs reviewed and stable and Patient moving all extremities X 4  Post vital signs: Reviewed and stable  Complications: No apparent anesthesia complications

## 2012-09-18 NOTE — Progress Notes (Signed)
Patient wants to lay on her back to sleep. Patient doesn't want to be turned every 2 hours. Will continue to monitor patient. Nelda Marseille, RN

## 2012-09-18 NOTE — Progress Notes (Signed)
I have examined the patient, review the x-rays, and recommended surgical intervention for intramedullary nail fixation of the right periprosthetic supracondylar femur fracture.  The risks benefits and alternatives were discussed with the patient including but not limited to the risks of nonoperative treatment, versus surgical intervention including infection, bleeding, nerve injury, malunion, nonunion, the need for revision surgery, hardware prominence, hardware failure, the need for hardware removal, blood clots, cardiopulmonary complications, morbidity, mortality, among others, and they were willing to proceed.

## 2012-09-18 NOTE — Anesthesia Postprocedure Evaluation (Signed)
Anesthesia Post Note  Patient: Deborah Cooke  Procedure(s) Performed: Procedure(s) (LRB): INTRAMEDULLARY (IM) RETROGRADE FEMORAL NAILING (Right)  Anesthesia type: General  Patient location: PACU  Post pain: Pain level controlled and Adequate analgesia  Post assessment: Post-op Vital signs reviewed, Patient's Cardiovascular Status Stable, Respiratory Function Stable, Patent Airway and Pain level controlled  Last Vitals:  Filed Vitals:   09/18/12 2130  BP: 120/78  Pulse: 81  Temp: 36.8 C  Resp: 14    Post vital signs: Reviewed and stable  Level of consciousness: awake, alert  and oriented  Complications: No apparent anesthesia complications

## 2012-09-18 NOTE — Anesthesia Preprocedure Evaluation (Addendum)
Anesthesia Evaluation  Patient identified by MRN, date of birth, ID band Patient awake    Reviewed: Allergy & Precautions, H&P , NPO status , Patient's Chart, lab work & pertinent test results, reviewed documented beta blocker date and time   History of Anesthesia Complications (+) PROLONGED EMERGENCE  Airway Mallampati: II TM Distance: >3 FB Neck ROM: full    Dental  (+) Dental Advisory Given and Teeth Intact   Pulmonary neg pulmonary ROS,  breath sounds clear to auscultation        Cardiovascular hypertension, On Medications Rhythm:regular Rate:Normal     Neuro/Psych  Headaches, Anxiety Depression negative psych ROS   GI/Hepatic negative GI ROS, Neg liver ROS,   Endo/Other  negative endocrine ROS  Renal/GU negative Renal ROS  negative genitourinary   Musculoskeletal   Abdominal (+)  Abdomen: soft. Bowel sounds: normal.  Peds  Hematology negative hematology ROS (+)   Anesthesia Other Findings See surgeon's H&P   Reproductive/Obstetrics negative OB ROS                         Anesthesia Physical Anesthesia Plan  ASA: III  Anesthesia Plan: General   Post-op Pain Management:    Induction: Intravenous  Airway Management Planned: Oral ETT  Additional Equipment:   Intra-op Plan:   Post-operative Plan: Extubation in OR  Informed Consent: I have reviewed the patients History and Physical, chart, labs and discussed the procedure including the risks, benefits and alternatives for the proposed anesthesia with the patient or authorized representative who has indicated his/her understanding and acceptance.   Dental Advisory Given  Plan Discussed with: CRNA and Surgeon  Anesthesia Plan Comments:         Anesthesia Quick Evaluation

## 2012-09-18 NOTE — Anesthesia Procedure Notes (Signed)
Procedure Name: Intubation Date/Time: 09/18/2012 5:41 PM Performed by: Ellin Goodie Pre-anesthesia Checklist: Patient identified, Emergency Drugs available, Suction available, Patient being monitored and Timeout performed Patient Re-evaluated:Patient Re-evaluated prior to inductionOxygen Delivery Method: Circle system utilized Preoxygenation: Pre-oxygenation with 100% oxygen Intubation Type: IV induction Ventilation: Mask ventilation without difficulty Laryngoscope Size: Mac and 3 Grade View: Grade I Tube type: Oral Tube size: 7.5 mm Number of attempts: 1 Airway Equipment and Method: Stylet Placement Confirmation: ETT inserted through vocal cords under direct vision,  positive ETCO2 and breath sounds checked- equal and bilateral Secured at: 22 cm Tube secured with: Tape Dental Injury: Teeth and Oropharynx as per pre-operative assessment

## 2012-09-19 ENCOUNTER — Encounter (HOSPITAL_COMMUNITY): Payer: Self-pay | Admitting: Orthopedic Surgery

## 2012-09-19 DIAGNOSIS — G894 Chronic pain syndrome: Secondary | ICD-10-CM | POA: Diagnosis present

## 2012-09-19 DIAGNOSIS — D62 Acute posthemorrhagic anemia: Secondary | ICD-10-CM | POA: Diagnosis not present

## 2012-09-19 DIAGNOSIS — M8080XA Other osteoporosis with current pathological fracture, unspecified site, initial encounter for fracture: Secondary | ICD-10-CM

## 2012-09-19 DIAGNOSIS — G8929 Other chronic pain: Secondary | ICD-10-CM | POA: Diagnosis present

## 2012-09-19 HISTORY — DX: Other osteoporosis with current pathological fracture, unspecified site, initial encounter for fracture: M80.80XA

## 2012-09-19 LAB — CBC
HCT: 26.8 % — ABNORMAL LOW (ref 36.0–46.0)
Hemoglobin: 9.1 g/dL — ABNORMAL LOW (ref 12.0–15.0)
MCH: 29.8 pg (ref 26.0–34.0)
MCHC: 34 g/dL (ref 30.0–36.0)
MCV: 87.9 fL (ref 78.0–100.0)
RBC: 3.05 MIL/uL — ABNORMAL LOW (ref 3.87–5.11)

## 2012-09-19 LAB — BASIC METABOLIC PANEL
BUN: 10 mg/dL (ref 6–23)
CO2: 27 mEq/L (ref 19–32)
Calcium: 8.5 mg/dL (ref 8.4–10.5)
Creatinine, Ser: 0.65 mg/dL (ref 0.50–1.10)
GFR calc non Af Amer: >90 mL/min (ref >90)
Glucose, Bld: 123 mg/dL — ABNORMAL HIGH (ref 70–99)

## 2012-09-19 NOTE — Care Management Note (Addendum)
    Page 1 of 2   09/19/2012     6:01:37 PM   CARE MANAGEMENT NOTE 09/19/2012  Patient:  Deborah Cooke, Deborah Cooke   Account Number:  [id number]  Date Initiated:  09/19/2012  Documentation initiated by:  Letha Cape  Subjective/Objective Assessment:   dx s/p orif of right femur  admit- monther n law will be staying with patient 24 hrs /day.     Action/Plan:   pt eval- recs hhpt with 24 hr as.   Anticipated DC Date:  09/20/2012   Anticipated DC Plan:  HOME W HOME HEALTH SERVICES      DC Planning Services  CM consult      [facility] Choice  HOME HEALTH   Choice offered to / List presented to:  C-1 Patient   DME arranged  BEDSIDE COMMODE  WHEELCHAIR - MANUAL  WALKER - ROLLING      DME agency  [facility]     HH arranged  HH-1 RN  HH-2 PT  HH-3 OT  HH-4 NURSE'S AIDE  HH-6 SOCIAL WORKER      HH agency  [facility]   Status of service:  Completed, signed off Medicare Important Message given?   (If response is "NO", the following Medicare IM given date fields will be blank) Date Medicare IM given:   Date Additional Medicare IM given:    Discharge Disposition:  HOME W HOME HEALTH SERVICES  Per UR Regulation:  Reviewed for med. necessity/level of care/duration of stay  If discussed at Long Length of Stay Meetings, dates discussed:    Comments:  09/19/12 14:31 Letha Cape RN, BSN [phone] patient 's monther n law will be staying with her for 24 hrs /day to help her.   Patient is refusing SNf and any other rehab and wants to go home.  Patient was given agency list for choice, she chose [facility], referral made to [facility], Lupita Leash notified.  Soc will begin 24-48 hrs post discharge. Patient will also need a armd drop commode and a 20' wide w/chair with elevating leg rest and removable arm rest and rolling walker. Left message for MD to put orders in for [facility], PT, OT, aide and CSW, patient for possible dc tomorrow or Thursday.

## 2012-09-19 NOTE — Evaluation (Signed)
Physical Therapy Evaluation Patient Details Name: Deborah Cooke MRN: [id number] DOB: May 28, 1951 Today's Date: 09/19/2012 Time: 4098-1191 PT Time Calculation (min): 33 min  PT Assessment / Plan / Recommendation Clinical Impression  Pt adm with rt periprosthetic distal femur fx and underwent ORIF. Recommended CIR or ST- SNF to pt but she is adamently refusing any type of rehab at [facility] or inpatient rehab and wants to go home with [facility] and assistance of family.  Pt will have 24 hour assist of mother-in-law and another family member available intermittently.  Pt will need wheelchair (20" width) with elevating legrests and removable armrests and drop arm commode. Will work on scoot transfers to make pt as independent as possible before dc home.    PT Assessment  Patient needs continued PT services    Follow Up Recommendations  Home health PT;Supervision/Assistance - 24 hour    Does the patient have the potential to tolerate intense rehabilitation      Barriers to Discharge Decreased caregiver support mother in law can provide min A.    Equipment Recommendations  Wheelchair (measurements PT);Other (comment) (20" width w/c, drop arm commode)    Recommendations for Other Services     Frequency Min 5X/week    Precautions / Restrictions Precautions Precautions: Fall Required Braces or Orthoses: Knee Immobilizer - Right Knee Immobilizer - Right: On at all times Restrictions Weight Bearing Restrictions: Yes RLE Weight Bearing: Non weight bearing   Pertinent Vitals/Pain 9/10 pain rt leg      Mobility  Bed Mobility Bed Mobility: Supine to Sit;Sitting - Scoot to Edge of Bed Supine to Sit: 1: +2 Total assist Supine to Sit: Patient Percentage: 80% Sitting - Scoot to Edge of Bed: 4: Min assist (to support rt leg) Details for Bed Mobility Assistance: Assist to bring trunk up and to move rt leg. Transfers Transfers: Heritage manager Transfers: 1: +2 Total assist Squat Pivot  Transfers: Patient Percentage: 80% Details for Transfer Assistance: 1 person supporting rt leg and 2nd person guiding/assisting with pivot.    Shoulder Instructions     Exercises     PT Diagnosis: Difficulty walking;Acute pain  PT Problem List: Decreased mobility;Pain;Decreased knowledge of use of DME;Decreased activity tolerance;Decreased range of motion;Decreased strength PT Treatment Interventions: DME instruction;Functional mobility training;Patient/family education;Therapeutic activities   PT Goals Acute Rehab PT Goals PT Goal Formulation: With patient Time For Goal Achievement: 09/26/12 Potential to Achieve Goals: Good Pt will go Supine/Side to Sit: with min assist PT Goal: Supine/Side to Sit - Progress: Goal set today Pt will go Sit to Supine/Side: with min assist PT Goal: Sit to Supine/Side - Progress: Goal set today Pt will Transfer Bed to Chair/Chair to Bed: with min assist PT Transfer Goal: Bed to Chair/Chair to Bed - Progress: Goal set today  Visit Information  Last PT Received On: 09/19/12 Assistance Needed: +2 PT/OT Co-Evaluation/Treatment: Yes    Subjective Data  Subjective: Pt states she is going home.  Pt refused either ST-SNF or CIR. Patient Stated Goal: Go home and be able to clean and go out with friends for lunch and go to the movies.   Prior Functioning  Home Living Lives With: Significant other Available Help at Discharge: Available 24 hours/day;Other (Comment) (mother in law can help when husband is traveling) Type of Home: Apartment Home Access: Level entry Home Layout: One level Bathroom Shower/Tub: Walk-in shower;Other (comment) (built in seat) Bathroom Toilet: Standard Bathroom Accessibility: Yes Home Adaptive Equipment: None Prior Function Level of Independence: Independent  Driving: Yes Vocation: On disability Communication Communication: No difficulties Dominant Hand: Right    Cognition  Overall Cognitive Status: Appears within  functional limits for tasks assessed/performed Arousal/Alertness: Awake/alert Orientation Level: Appears intact for tasks assessed Behavior During Session: [facility] for tasks performed    Extremity/Trunk Assessment Right Upper Extremity Assessment RUE ROM/Strength/Tone: Within functional levels RUE Sensation: WFL - Light Touch RUE Coordination: WFL - gross/fine motor Left Upper Extremity Assessment LUE ROM/Strength/Tone: Within functional levels LUE Sensation: WFL - Light Touch LUE Coordination: WFL - gross/fine motor Right Lower Extremity Assessment RLE ROM/Strength/Tone: Unable to fully assess;Due to precautions;Due to pain Left Lower Extremity Assessment LLE ROM/Strength/Tone: WFL for tasks assessed Trunk Assessment Trunk Assessment: Normal   Balance Balance Balance Assessed: Yes Static Sitting Balance Static Sitting - Balance Support: Left upper extremity supported;Right upper extremity supported Static Sitting - Level of Assistance: 6: Modified independent (Device/Increase time)  End of Session PT - End of Session Equipment Utilized During Treatment: Gait belt Activity Tolerance: Patient tolerated treatment well Patient left: in chair;with call bell/phone within reach Nurse Communication: Mobility status  GP     [facility] 09/19/2012, 12:57 PM  [employer] PT [phone]

## 2012-09-19 NOTE — Evaluation (Signed)
Occupational Therapy Evaluation Patient Details Name: Deborah Cooke MRN: [id number] DOB: Mar 22, 1951 Today's Date: 09/19/2012 Time: 4098-1191 OT Time Calculation (min): 34 min  OT Assessment / Plan / Recommendation Clinical Impression  Pleasant 61 yr old female admitted after fall resulting in RLE distal femur fracture.  Currently needs increased dependence for basic selfcare tasks at this time.  Will benefit from acute OT to help address these deficits in order to increase her indepedence.  Feel pt needs 24 hour supervision at discharge and would benefit from follow-up CIR level therapies however pt is currently refusing and wants to go home.  If dishcarge home recommend HHOT as well as drop arm commode.    OT Assessment  Patient needs continued OT Services    Follow Up Recommendations  Home health OT    Barriers to Discharge Decreased caregiver support    Equipment Recommendations  Other (comment) (drop arm commode)       Frequency  Min 2X/week    Precautions / Restrictions Precautions Precautions: Fall Required Braces or Orthoses: Knee Immobilizer - Right Knee Immobilizer - Right: On at all times Restrictions Weight Bearing Restrictions: Yes RLE Weight Bearing: Non weight bearing   Pertinent Vitals/Pain 92% on room air    ADL  Eating/Feeding: Simulated;Independent Where Assessed - Eating/Feeding: Chair Grooming: Simulated;Set up Where Assessed - Grooming: Unsupported sitting Upper Body Bathing: Simulated;Set up Where Assessed - Upper Body Bathing: Unsupported sitting Lower Body Bathing: Simulated;+2 Total assistance Lower Body Bathing: Patient Percentage: 70% Where Assessed - Lower Body Bathing: Supported sit to stand Upper Body Dressing: Simulated;Set up Where Assessed - Upper Body Dressing: Unsupported sitting Lower Body Dressing: Simulated;+2 Total assistance Lower Body Dressing: Patient Percentage: 70% Where Assessed - Lower Body Dressing: Supported sit to  stand Toilet Transfer: Simulated;+2 Total assistance Toilet Transfer: Patient Percentage: 70% Statistician Method: Surveyor, minerals: Other (comment) (to bedside chair pt declined need to toilet) Toileting - Clothing Manipulation and Hygiene: Simulated;+2 Total assistance Toileting - Clothing Manipulation and Hygiene: Patient Percentage: 70% Where Assessed - Toileting Clothing Manipulation and Hygiene: Sit to stand from 3-in-1 or toilet Tub/Shower Transfer Method: Not assessed Equipment Used: Knee Immobilizer Transfers/Ambulation Related to ADLs: Pt required total assist +2 pt (70%) for pivot transfer from bed to bedside chair.  Second person needed for pt to keep from weightbearing over the RLE. ADL Comments: Pt with decreased ability to reach down to her feet for LB selfcare.  Will have some assist from her mother-in-law at home but will need practice on the best technique for transfers.  Will need to practice scooting transfers to drop arm commode for toileting.  Will also likely need to use AE for LB selfcare.    OT Diagnosis: Generalized weakness;Acute pain  OT Problem List: Decreased strength;Decreased knowledge of use of DME or AE;Pain;Impaired balance (sitting and/or standing) OT Treatment Interventions: Self-care/ADL training;DME and/or AE instruction;Balance training;Patient/family education;Therapeutic activities   OT Goals Acute Rehab OT Goals OT Goal Formulation: With patient Time For Goal Achievement: 10/03/12 Potential to Achieve Goals: Good ADL Goals Pt Will Perform Lower Body Bathing: with min assist;Sitting, edge of bed;Other (comment);with adaptive equipment (lateral leans vs sit to stand) ADL Goal: Lower Body Bathing - Progress: Goal set today Pt Will Perform Lower Body Dressing: with min assist;Sitting, bed;with adaptive equipment;Other (comment) (sit to stand vs lateral leans) ADL Goal: Lower Body Dressing - Progress: Goal set today Pt Will  Transfer to Toilet: with DME;Drop arm 3-in-1;Other (comment) (scooting transfer with and  without board) ADL Goal: Toilet Transfer - Progress: Goal set today Pt Will Perform Toileting - Clothing Manipulation: with supervision;Other (comment) (sitting with lateral leans for gown) ADL Goal: Toileting - Clothing Manipulation - Progress: Goal set today Pt Will Perform Toileting - Hygiene: with supervision ADL Goal: Toileting - Hygiene - Progress: Goal set today  Visit Information  Last OT Received On: 09/19/12 Assistance Needed: +2 PT/OT Co-Evaluation/Treatment: Yes    Subjective Data  Subjective: I really want to go home, I'm not going to a rehab. Patient Stated Goal: Pt wants to get back to driving.   Prior Functioning     Home Living Lives With: Significant other Available Help at Discharge: Available 24 hours/day;Other (Comment) (mother in law can help when husband is traveling) Type of Home: Apartment Home Access: Level entry Home Layout: One level Bathroom Shower/Tub: Walk-in shower;Other (comment) (built in seat) Firefighter: Standard Bathroom Accessibility: Yes Home Adaptive Equipment: None Prior Function Level of Independence: Independent Driving: Yes Vocation: On disability Communication Communication: No difficulties Dominant Hand: Right         Vision/Perception Vision - Assessment Eye Alignment: Within Functional Limits Vision Assessment: Vision not tested Perception Perception: Within Functional Limits Praxis Praxis: Intact   Cognition  Overall Cognitive Status: Appears within functional limits for tasks assessed/performed Arousal/Alertness: Awake/alert Orientation Level: Appears intact for tasks assessed Behavior During Session: [facility] for tasks performed    Extremity/Trunk Assessment Right Upper Extremity Assessment RUE ROM/Strength/Tone: Within functional levels RUE Sensation: WFL - Light Touch RUE Coordination: WFL - gross/fine motor Left  Upper Extremity Assessment LUE ROM/Strength/Tone: Within functional levels LUE Sensation: WFL - Light Touch LUE Coordination: WFL - gross/fine motor Right Lower Extremity Assessment RLE ROM/Strength/Tone: Unable to fully assess;Due to precautions;Due to pain Left Lower Extremity Assessment LLE ROM/Strength/Tone: [facility] for tasks assessed Trunk Assessment Trunk Assessment: Normal     Mobility Bed Mobility Bed Mobility: Supine to Sit;Sitting - Scoot to Edge of Bed Supine to Sit: 1: +2 Total assist Supine to Sit: Patient Percentage: 80% Sitting - Scoot to Edge of Bed: 4: Min assist (to support rt leg) Details for Bed Mobility Assistance: Assist to bring trunk up and to move rt leg. Transfers Details for Transfer Assistance: 1 person supporting rt leg and 2nd person guiding/assisting with pivot.           Balance Balance Balance Assessed: Yes Static Sitting Balance Static Sitting - Balance Support: Left upper extremity supported;Right upper extremity supported Static Sitting - Level of Assistance: 6: Modified independent (Device/Increase time)   End of Session OT - End of Session Equipment Utilized During Treatment: Gait belt Activity Tolerance: Patient tolerated treatment well Patient left: in chair;with call bell/phone within reach     [facility] OTR/L Pager number [phone] 09/19/2012, 1:01 PM

## 2012-09-19 NOTE — Progress Notes (Addendum)
Subjective:  Patient reports pain as 10/10, although she appears comfortable in bed. She has not yet gotten out of bed.  She is on oxycodone 30 mg every 6 hours at baseline prior to her fracture.  Objective:   VITALS:   Filed Vitals:   09/19/12 0435 09/19/12 0600 09/19/12 0619 09/19/12 0800  BP:  176/80 158/91   Pulse:  96 98   Temp:  100 F (37.8 C)    TempSrc:  Oral    Resp: 20 20  20   Height:      Weight:      SpO2: 93% 90% 93% 96%    Knee immobilizer is intact, although it has slid down her leg. EHL and FHL are firing. Dressings are clean.  LABS  Results for orders placed during the hospital encounter of 09/16/12 (from the past 24 hour(s))  BODY FLUID CULTURE     Status: Normal (Preliminary result)   Collection Time   09/18/12  6:29 PM      Component Value Range   Specimen Description FLUID SYNOVIAL KNEE RIGHT     Special Requests ON SWABS PT ON ANCEF     Gram Stain       Value: FEW WBC PRESENT,BOTH PMN AND MONONUCLEAR     NO ORGANISMS SEEN     Performed at [facility] Gram Stain Report Called to,Read Back By and Verified With: Gram Stain Report Called to,Read Back By and Verified With: DR Corbitt Cloke @19 :47 ON 09/18/12 BY A BROWNING   Culture PENDING     Report Status PENDING    ANAEROBIC CULTURE     Status: Normal (Preliminary result)   Collection Time   09/18/12  6:29 PM      Component Value Range   Specimen Description FLUID SYNOVIAL KNEE RIGHT     Special Requests ON SWABS PT ON ANCEF     Gram Stain       Value: MODERATE WBC PRESENT, PREDOMINANTLY PMN     NO ORGANISMS SEEN   Culture PENDING     Report Status PENDING    GRAM STAIN     Status: Normal   Collection Time   09/18/12  6:29 PM      Component Value Range   Specimen Description FLUID SYNOVIAL KNEE RIGHT     Special Requests ON SWABS PT ON ANCEF     Gram Stain       Value: FEW WBC PRESENT,BOTH PMN AND MONONUCLEAR     NO ORGANISMS SEEN     Gram Stain Report Called to,Read Back By and  Verified With: DR Shamra Bradeen 1947 09/18/12 A BROWNING   Report Status 09/18/2012 FINAL    BODY FLUID CULTURE     Status: Normal (Preliminary result)   Collection Time   09/18/12  6:33 PM      Component Value Range   Specimen Description FLUID SYNOVIAL KNEE RIGHT     Special Requests PATIENT ON FOLLOWING ANCEF     Gram Stain       Value: MODERATE WBC PRESENT,BOTH PMN AND MONONUCLEAR     NO ORGANISMS SEEN   Culture PENDING     Report Status PENDING    GRAM STAIN     Status: Normal   Collection Time   09/18/12  6:33 PM      Component Value Range   Specimen Description FLUID SYNOVIAL KNEE RIGHT     Special Requests PATIENT ON FOLLOWING ANCEF     Gram Stain  Value: ABUNDANT WBC PRESENT,BOTH PMN AND MONONUCLEAR     NO ORGANISMS SEEN     Gram Stain Report Called to,Read Back By and Verified With: DR Zacari Radick 1947 09/18/12 A BROWNING   Report Status 09/18/2012 FINAL    ANAEROBIC CULTURE     Status: Normal (Preliminary result)   Collection Time   09/18/12  6:33 PM      Component Value Range   Specimen Description FLUID SYNOVIAL KNEE RIGHT     Special Requests PATIENT ON FOLLOWING ANCEF     Gram Stain       Value: MODERATE WBC PRESENT,BOTH PMN AND MONONUCLEAR     NO ORGANISMS SEEN   Culture PENDING     Report Status PENDING    CBC     Status: Abnormal   Collection Time   09/19/12  5:40 AM      Component Value Range   WBC 6.3  4.0 - 10.5 K/uL   RBC 3.05 (*) 3.87 - 5.11 MIL/uL   Hemoglobin 9.1 (*) 12.0 - 15.0 g/dL   HCT 40.9 (*) 81.1 - 91.4 %   MCV 87.9  78.0 - 100.0 fL   MCH 29.8  26.0 - 34.0 pg   MCHC 34.0  30.0 - 36.0 g/dL   RDW 78.2  95.6 - 21.3 %   Platelets 238  150 - 400 K/uL  BASIC METABOLIC PANEL     Status: Abnormal   Collection Time   09/19/12  5:40 AM      Component Value Range   Sodium 136  135 - 145 mEq/L   Potassium 4.0  3.5 - 5.1 mEq/L   Chloride 100  96 - 112 mEq/L   CO2 27  19 - 32 mEq/L   Glucose, Bld 123 (*) 70 - 99 mg/dL   BUN 10  6 - 23 mg/dL    Creatinine, Ser 0.86  0.50 - 1.10 mg/dL   Calcium 8.5  8.4 - 57.8 mg/dL   GFR calc non Af Amer >90  >90 mL/min   GFR calc Af Amer >90  >90 mL/min    Dg Femur Right  09/18/2012  *RADIOLOGY REPORT*  Clinical Data: 61 year old female.  Right femur fracture.  RIGHT FEMUR - 2 VIEW,DG C-ARM 61-120 MIN  Technique: Four intraoperative fluoroscopic views of the right femur.  Comparison:  09/17/2012.  Findings: Preexisting right total knee arthroplasty.  Right femoral intramedullary rod placed with a single proximal interlocking screw and joule distal interlocking screws.  These traverse the metadiaphysis fracture site.  Near anatomic alignment.  Hardware appears intact.  IMPRESSION: ORIF distal right femur fracture with no adverse features.   Original Report Authenticated By: Erskine Speed, M.D.    Dg Femur Right Port  09/18/2012  *RADIOLOGY REPORT*  Clinical Data: 61 year old female status post ORIF.  PORTABLE RIGHT FEMUR - 2 VIEW  Comparison: Intraoperative films 1854 hours the same day and earlier. Postoperative right knee radiographs.  Findings: Portable and cross-table lateral views of the proximal and mid right femur.  Intramedullary rod in place.  Proximal interlocking cortical screw.  Visible hardware intact.  Proximal femur intact.  IMPRESSION: Right femur ORIF with no adverse features.   Original Report Authenticated By: Erskine Speed, M.D.    Dg Knee Right Port  09/18/2012  *RADIOLOGY REPORT*  Clinical Data: 61 year old female postop intramedullary femoral nail.  PORTABLE RIGHT KNEE - 1-2 VIEW  Comparison: Intraoperative films 1854 hours same day.  Findings: Portable AP and cross-table lateral views.  New femur intramedullary rod, the distal aspect which is visualized with two interlocking screws.  Comminuted fracture of the medial distal right femoral metadiaphysis re-identified.  Stable to improved alignment fracture fragment.  Preexisting total knee arthroplasty appears stable.  IMPRESSION:  Postoperative changes at the distal right femur.  No adverse features identified.   Original Report Authenticated By: Erskine Speed, M.D.    Dg C-arm [phone] Min  09/18/2012  *RADIOLOGY REPORT*  Clinical Data: 61 year old female.  Right femur fracture.  RIGHT FEMUR - 2 VIEW,DG C-ARM 61-120 MIN  Technique: Four intraoperative fluoroscopic views of the right femur.  Comparison:  09/17/2012.  Findings: Preexisting right total knee arthroplasty.  Right femoral intramedullary rod placed with a single proximal interlocking screw and joule distal interlocking screws.  These traverse the metadiaphysis fracture site.  Near anatomic alignment.  Hardware appears intact.  IMPRESSION: ORIF distal right femur fracture with no adverse features.   Original Report Authenticated By: Erskine Speed, M.D.     Assessment/Plan: 1 Day Post-Op   Principal Problem:  *Closed supracondylar fracture of right femur  Chronic pain, with multiple previous fractures, on high-dose opioids  Acute blood loss anemia, mild, observe  Nonweightbearing, right lower extremity, work with physical therapy on bed to wheelchair transfers. She is not likely to be a we'll to negotiate a walker. She will likely need skilled nursing placement, social work consult. She has known osteoporosis, and is scheduled for a scan later this month, and I will defer to her primary care regarding this.  From a pain management standpoint, she is currently on IV Dilaudid, in addition to her oxycodone, as well as Percocet.  She does have a pain contract and she will be contacting her pain management physician today. I have written her for Percocet to go home/SNF with, she will continue with the oxycodone, and also Dilaudid by mouth.  I would anticipate discharge either Wednesday or Thursday depending on mobility, and bed availability.  obesity s/p gastric bypass:  Estimated Body mass index is 34.72 kg/(m^2) as calculated from the following:   Height as of this  encounter: 5\' 0" (1.524 m).   Weight as of this encounter: 177 lb 12.8 oz(80.65 kg).   Darin Arndt P 09/19/2012, 10:14 AM   Teryl Lucy, MD Cell [phone] Pager [phone]

## 2012-09-19 NOTE — Progress Notes (Signed)
PT Progress Note  09/19/12 1409  PT Visit Information  Last PT Received On 09/19/12  Assistance Needed +2  PT Time Calculation  PT Start Time 1343  PT Stop Time 1357  PT Time Calculation (min) 14 min  Subjective Data  Subjective Pt asking about getting her w/c.  Explained case management will order one.  Precautions  Precautions Fall  Required Braces or Orthoses Knee Immobilizer - Right  Knee Immobilizer - Right On at all times  Restrictions  Weight Bearing Restrictions Yes  RLE Weight Bearing NWB  Cognition  Overall Cognitive Status Appears within functional limits for tasks assessed/performed  Arousal/Alertness Awake/alert  Orientation Level Appears intact for tasks assessed  Behavior During Session [facility] for tasks performed  Bed Mobility  Bed Mobility Sit to Supine  Sit to Supine 4: Min assist;HOB flat (assist with rt leg)  Transfers  Transfers Squat Pivot Transfers  Squat Pivot Transfers 1: +2 Total assist  Squat Pivot Transfers: Patient Percentage 80%  Details for Transfer Assistance 1 person supporting rt leg and 2nd person guiding/assisting with pivot.  PT - End of Session  Equipment Utilized During Treatment Right knee immobilizer  Activity Tolerance Patient tolerated treatment well  Patient left in bed;with call bell/phone within reach  Nurse Communication Mobility status  PT - Assessment/Plan  Comments on Treatment Session Pt adm with rt distal periprosthetic femur fx.  Underwent ORIF.  Currently requiring 2 person assist but refusing any post acute rehab except HH.  Will try scoot or sliding board transfers tomorrow.  PT Plan Discharge plan remains appropriate;Frequency remains appropriate  PT Frequency Min 5X/week  Follow Up Recommendations Home health PT;Supervision/Assistance - 24 hour  PT equipment Wheelchair (measurements PT);Other (comment)  Acute Rehab PT Goals  PT Goal: Sit to Supine/Side - Progress Goal set today  PT Transfer Goal: Bed to Chair/Chair to  Bed - Progress Goal set today  PT General Charges  $$ ACUTE PT VISIT 1 Procedure  PT Treatments  $Therapeutic Activity 8-22 mins    [facility] PT [phone]

## 2012-09-19 NOTE — Progress Notes (Signed)
Patient refuse to have foley taken out,request to have it till tomorrow  Morning,Per MD just document pt refused.

## 2012-09-19 NOTE — Progress Notes (Signed)
Orthopedic Tech Progress Note Patient Details:  Deborah Cooke [birth year] [id number] OHF with trapeze bar has already been applied to patient's bed (before surgery) Patient ID: Deborah Cooke, female   DOB: 02-May-1951, 61 y.o.   MRN: [id number]   [name] 09/19/2012, 11:13 AM

## 2012-09-20 DIAGNOSIS — I10 Essential (primary) hypertension: Secondary | ICD-10-CM | POA: Diagnosis present

## 2012-09-20 DIAGNOSIS — F419 Anxiety disorder, unspecified: Secondary | ICD-10-CM | POA: Diagnosis present

## 2012-09-20 DIAGNOSIS — M810 Age-related osteoporosis without current pathological fracture: Secondary | ICD-10-CM | POA: Diagnosis present

## 2012-09-20 DIAGNOSIS — E079 Disorder of thyroid, unspecified: Secondary | ICD-10-CM | POA: Diagnosis present

## 2012-09-20 DIAGNOSIS — E039 Hypothyroidism, unspecified: Secondary | ICD-10-CM | POA: Diagnosis present

## 2012-09-20 LAB — BASIC METABOLIC PANEL
BUN: 9 mg/dL (ref 6–23)
Calcium: 8.6 mg/dL (ref 8.4–10.5)
Creatinine, Ser: 0.62 mg/dL (ref 0.50–1.10)
GFR calc Af Amer: >90 mL/min (ref >90)
GFR calc non Af Amer: >90 mL/min (ref >90)
Glucose, Bld: 112 mg/dL — ABNORMAL HIGH (ref 70–99)
Potassium: 3.6 mEq/L (ref 3.5–5.1)

## 2012-09-20 LAB — CBC
HCT: 29 % — ABNORMAL LOW (ref 36.0–46.0)
Hemoglobin: 10 g/dL — ABNORMAL LOW (ref 12.0–15.0)
MCH: 29.9 pg (ref 26.0–34.0)
MCHC: 34.5 g/dL (ref 30.0–36.0)
MCV: 86.8 fL (ref 78.0–100.0)
RDW: 13 % (ref 11.5–15.5)

## 2012-09-20 NOTE — Progress Notes (Signed)
1630 Discharge instruction reviewed with patient and mother-in-law verbalized and understands. Prescriptions given to patient. Skin WNL with right knee immobolizer intact and small mepilex dsg to right thigh. All equipment delivered to patient room from [facility]. Left floor in wheelchair with right leg extended outward accompanied by 2 staff members and family.

## 2012-09-20 NOTE — Progress Notes (Signed)
PT Treatment Note: (addendum)  Asked to step into pt room to address d/c equipment needs. Representative from [facility] present with manual wheelchair with normal foot rests. Reviewed chart and educated pt and [facility] representative on need for bilateral LE elevating leg rests. RN made aware.  09/20/2012 Cephus Shelling, PT, DPT [phone]

## 2012-09-20 NOTE — Progress Notes (Signed)
Patient ID: Deborah Cooke, female   DOB: 1950/12/18, 62 y.o.   MRN: [id number]     Subjective:  Patient reports pain as mild to moderate.  Feeling better today and is about to get up with PT.  Objective:   VITALS:   Filed Vitals:   09/19/12 1200 09/19/12 1435 09/19/12 2200 09/20/12 0200  BP:  108/61 155/96 144/88  Pulse:  100 99 90  Temp:  98.4 F (36.9 C) 100 F (37.8 C) 99.6 F (37.6 C)  TempSrc:   Oral Oral  Resp: 18 20 20 18   Height:      Weight:      SpO2: 95% 95% 94% 93%    ABD soft Sensation intact distally Dorsiflexion/Plantar flexion intact Incision: dressing C/D/I and no drainage  LABS  Results for orders placed during the hospital encounter of 09/16/12 (from the past 24 hour(s))  CBC     Status: Abnormal   Collection Time   09/20/12  6:55 AM      Component Value Range   WBC 7.4  4.0 - 10.5 K/uL   RBC 3.34 (*) 3.87 - 5.11 MIL/uL   Hemoglobin 10.0 (*) 12.0 - 15.0 g/dL   HCT 40.9 (*) 81.1 - 91.4 %   MCV 86.8  78.0 - 100.0 fL   MCH 29.9  26.0 - 34.0 pg   MCHC 34.5  30.0 - 36.0 g/dL   RDW 78.2  95.6 - 21.3 %   Platelets 261  150 - 400 K/uL  BASIC METABOLIC PANEL     Status: Abnormal   Collection Time   09/20/12  6:55 AM      Component Value Range   Sodium 138  135 - 145 mEq/L   Potassium 3.6  3.5 - 5.1 mEq/L   Chloride 102  96 - 112 mEq/L   CO2 27  19 - 32 mEq/L   Glucose, Bld 112 (*) 70 - 99 mg/dL   BUN 9  6 - 23 mg/dL   Creatinine, Ser 0.86  0.50 - 1.10 mg/dL   Calcium 8.6  8.4 - 57.8 mg/dL   GFR calc non Af Amer >90  >90 mL/min   GFR calc Af Amer >90  >90 mL/min    Dg Femur Right  09/18/2012  *RADIOLOGY REPORT*  Clinical Data: 62 year old female.  Right femur fracture.  RIGHT FEMUR - 2 VIEW,DG C-ARM 61-120 MIN  Technique: Four intraoperative fluoroscopic views of the right femur.  Comparison:  09/17/2012.  Findings: Preexisting right total knee arthroplasty.  Right femoral intramedullary rod placed with a single proximal interlocking screw and joule  distal interlocking screws.  These traverse the metadiaphysis fracture site.  Near anatomic alignment.  Hardware appears intact.  IMPRESSION: ORIF distal right femur fracture with no adverse features.   Original Report Authenticated By: Erskine Speed, M.D.    Dg Femur Right Port  09/18/2012  *RADIOLOGY REPORT*  Clinical Data: 62 year old female status post ORIF.  PORTABLE RIGHT FEMUR - 2 VIEW  Comparison: Intraoperative films 1854 hours the same day and earlier. Postoperative right knee radiographs.  Findings: Portable and cross-table lateral views of the proximal and mid right femur.  Intramedullary rod in place.  Proximal interlocking cortical screw.  Visible hardware intact.  Proximal femur intact.  IMPRESSION: Right femur ORIF with no adverse features.   Original Report Authenticated By: Erskine Speed, M.D.    Dg Knee Right Port  09/18/2012  *RADIOLOGY REPORT*  Clinical Data: 62 year old female postop intramedullary femoral nail.  PORTABLE RIGHT KNEE - 1-2 VIEW  Comparison: Intraoperative films 1854 hours same day.  Findings: Portable AP and cross-table lateral views.  New femur intramedullary rod, the distal aspect which is visualized with two interlocking screws.  Comminuted fracture of the medial distal right femoral metadiaphysis re-identified.  Stable to improved alignment fracture fragment.  Preexisting total knee arthroplasty appears stable.  IMPRESSION: Postoperative changes at the distal right femur.  No adverse features identified.   Original Report Authenticated By: Erskine Speed, M.D.    Dg C-arm [phone] Min  09/18/2012  *RADIOLOGY REPORT*  Clinical Data: 62 year old female.  Right femur fracture.  RIGHT FEMUR - 2 VIEW,DG C-ARM 61-120 MIN  Technique: Four intraoperative fluoroscopic views of the right femur.  Comparison:  09/17/2012.  Findings: Preexisting right total knee arthroplasty.  Right femoral intramedullary rod placed with a single proximal interlocking screw and joule distal  interlocking screws.  These traverse the metadiaphysis fracture site.  Near anatomic alignment.  Hardware appears intact.  IMPRESSION: ORIF distal right femur fracture with no adverse features.   Original Report Authenticated By: Erskine Speed, M.D.     Assessment/Plan: 2 Days Post-Op   Principal Problem:  *Closed supracondylar fracture of right femur Active Problems:  Acute blood loss anemia  Chronic pain  Osteoporosis with fracture   [facility]vance diet Up with therapy Discharge home with home health if passes PT and is safe to be home NWB right lower ext. Dry dressing change PRN ABLA resolving.   Haskel Khan 09/20/2012, 8:42 AM   Teryl Lucy, MD Cell [phone] Pager [phone]

## 2012-09-20 NOTE — Progress Notes (Signed)
Physical Therapy Treatment Patient Details Name: Deborah Cooke MRN: [id number] DOB: December 25, 1950 Today's Date: 09/20/2012 Time: 7829-5621 PT Time Calculation (min): 25 min  PT Assessment / Plan / Recommendation Comments on Treatment Session  Patient is progressing very well with transferring today. She is highly motivated and wants to return home today with her mother in law. Mother in Conni Elliot has assisted patient in the past after TKAs. Patient is very knowledgable in mobility and how she needs to move. She will have equipment delievered today and states she feels safe with going home if that is the MDs plan    Follow Up Recommendations  Home health PT;Supervision/Assistance - 24 hour     Does the patient have the potential to tolerate intense rehabilitation     Barriers to Discharge        Equipment Recommendations  Wheelchair (measurements PT)    Recommendations for Other Services    Frequency Min 5X/week   Plan Discharge plan remains appropriate;Frequency remains appropriate    Precautions / Restrictions Precautions Precautions: Fall Required Braces or Orthoses: Knee Immobilizer - Right Knee Immobilizer - Right: On at all times Restrictions RLE Weight Bearing: Non weight bearing   Pertinent Vitals/Pain     Mobility  Bed Mobility Supine to Sit: 5: Supervision Sitting - Scoot to Edge of Bed: 5: Supervision Details for Bed Mobility Assistance: Patient requires increased time to slide LEs off of bed but able to complete with only cues for positioning Transfers Transfers: Lateral/Scoot Transfers Lateral/Scoot Transfers: 4: Min assist Details for Transfer Assistance: Patient required very minimal assist to complete lateral transfer to chair. Able to keep R LE off of ground. Patient with good clearance of buttock with shifting weight. Patient practice scooting laterally on bed L and R prior to transferring and was able to scoot with supervision.  Ambulation/Gait Ambulation/Gait  Assistance: Not tested (comment)    Exercises     PT Diagnosis:    PT Problem List:   PT Treatment Interventions:     PT Goals Acute Rehab PT Goals PT Goal: Supine/Side to Sit - Progress: Met PT Transfer Goal: Bed to Chair/Chair to Bed - Progress: Met  Visit Information  Last PT Received On: 09/20/12 Assistance Needed: +1    Subjective Data      Cognition  Overall Cognitive Status: Appears within functional limits for tasks assessed/performed Arousal/Alertness: Awake/alert Orientation Level: Appears intact for tasks assessed Behavior During Session: [facility] for tasks performed    Balance     End of Session PT - End of Session Equipment Utilized During Treatment: Gait belt Activity Tolerance: Patient tolerated treatment well Patient left: in chair;with call bell/phone within reach Nurse Communication: Mobility status   GP     Fredrich Birks 09/20/2012, 9:48 AM 09/20/2012 Fredrich Birks PTA [phone] pager [phone] office

## 2012-09-20 NOTE — Progress Notes (Signed)
[facility] (OTS)  Subjective: 2 Days Post-Op Procedure(s) (LRB): INTRAMEDULLARY (IM) RETROGRADE FEMORAL NAILING (Right)  Doing ok this am No specific complaints Quite eager to go home today  Foley to be d/c'd this am Pain tolerable To discuss treatment strateg with pain MD tomorrow   Denies CP, SOB No n/v/d No h/a or lightheadedness  Sitting up in bedside chair  Spoke with PT myself, PT cleared from mobility standpoint to d/c home once all equipment is there   Objective: Current Vitals Blood pressure 144/88, pulse 90, temperature 99.6 F (37.6 C), temperature source Oral, resp. rate 18, height 5' (1.524 m), weight 80.65 kg (177 lb 12.8 oz), SpO2 93.00%. Vital signs in last 24 hours: Temp:  [98.2 F (36.8 C)-100 F (37.8 C)] 99.6 F (37.6 C) (01/01 0200) Pulse Rate:  [90-100] 90  (01/01 0200) Resp:  [18-20] 18  (01/01 0200) BP: (108-175)/(61-96) 144/88 mmHg (01/01 0200) SpO2:  [92 %-95 %] 93 % (01/01 0200)  Intake/Output from previous day: 12/31 0701 - 01/01 0700 In: 2845 [P.O.:1080; I.V.:1765] Out: 5775 [Urine:5775] Intake/Output      12/31 0701 - 01/01 0700 01/01 0701 - 01/02 0700   P.O. 1080 240   I.V. (mL/kg) 1765 (21.9)    IV Piggyback     Total Intake(mL/kg) 2845 (35.3) 240 (3)   Urine (mL/kg/hr) 5775 (3)    Blood     Total Output 5775    Net -2930 +240           LABS  Basename 09/20/12 0655 09/19/12 0540  HGB 10.0* 9.1*    Basename 09/20/12 0655 09/19/12 0540  WBC 7.4 6.3  RBC 3.34* 3.05*  HCT 29.0* 26.8*  PLT 261 238    Basename 09/20/12 0655 09/19/12 0540  NA 138 136  K 3.6 4.0  CL 102 100  CO2 27 27  BUN 9 10  CREATININE 0.62 0.65  GLUCOSE 112* 123*  CALCIUM 8.6 8.5   No results found for this basename: LABPT:2,INR:2 in the last 72 hours   Physical Exam  Gen: Appears well, comfortable, NAD Lungs:clear B Cardiac:reg s1 and s2 Abd: + BS, NT Ext:      Right Lower Extremity  KI in place  Dressing  c/d/i  DPN, SPN, TN sensation intact, Fem nv sensation intact  EHL, FHL, AT, PT, peroneals, gastroc motor intact  Quad and hamstring motor intact  + DP pulse  No DCT  + Swelling but expected  Assessment/Plan: 2 Days Post-Op Procedure(s) (LRB): INTRAMEDULLARY (IM) RETROGRADE FEMORAL NAILING (Right)  62 y/o female s/p mechanical fall with R periprosthetic distal femur fx  1. Mechanical fall 2. Closed R periprosthetic distal femur fx s/p IMN pod 2  NWB  PT/OT  Pt refuses SNF, adamant about d/cing to home 3. Chronic pain  Breakthrough narcotic Rx's on chart  Pt to discuss with neurologis/pain MD tomorrow 4. DVT/PE prophylaxis  Lovenox x 21 days, Rx on chart 5. Obesity 6. ABL anemia  Stable  Asymptomatic 7. HTN  Stable  Continue home meds 8. Osteoporosis  Contributing factor to fracture  Continue home meds  Per record, pt to have DEXA as outpt 9. Thyroid disease  Home meds 10. Anxiety  Home meds 11. FEN  D/c foley  Pt will need to void on own prior to d/c 12. Dispo  PT/OT today  HH PT ordered  Possible d/c later today if pt passes PT and able to void  Mearl Latin, PA-C [facility] [phone]  P) 09/20/2012, 9:50 AM

## 2012-09-20 NOTE — Progress Notes (Signed)
Occupational Therapy Treatment Patient Details Name: Deborah Cooke MRN: [id number] DOB: Nov 30, 1950 Today's Date: 09/20/2012 Time: 4098-1191 OT Time Calculation (min): 25 min  OT Assessment / Plan / Recommendation Comments on Treatment Session  Pt progressing well with therapy and is ready to d/c home today with assist of mother-in-law. Pt will need drop arm bedside commode and tub/shower bench as well as HHOT at d/c.    Follow Up Recommendations  Home health OT    Barriers to Discharge       Equipment Recommendations  Tub/shower bench (drop arm bedside commode)    Recommendations for Other Services    Frequency Min 2X/week   Plan      Precautions / Restrictions Precautions Precautions: Fall Required Braces or Orthoses: Knee Immobilizer - Right Knee Immobilizer - Right: On at all times Restrictions Weight Bearing Restrictions: Yes RLE Weight Bearing: Non weight bearing   Pertinent Vitals/Pain Pt reports 8/10 RLE pain- RN made aware and provided medication    ADL  Grooming: Simulated;Set up Where Assessed - Grooming: Unsupported sitting Lower Body Bathing: Maximal assistance Where Assessed - Lower Body Bathing: Supported sit to stand Lower Body Dressing: Maximal assistance Where Assessed - Lower Body Dressing: Supported sit to stand Toilet Transfer: Minimal assistance Toilet Transfer Method: Stand pivot (and lateral transfer) Acupuncturist: Bedside commode Toileting - Clothing Manipulation and Hygiene: Maximal assistance Where Assessed - Engineer, mining and Hygiene: Lean right and/or left;Sit to stand from 3-in-1 or toilet Transfers/Ambulation Related to ADLs: Min A with lateral and stand pivot transfers. Pt able to maintain NWB during transfer this session.  ADL Comments: Educated pt on lateral transfers to w/c and drop arm commode. None available at time of session but pt able to verbalize and simulate transfers well. Pt educated on LB  dsg/bathing adaptations and equipment- pt states she is comfortable with mother-in-law assist her. Pt with h/o 2 TKA's and is comfortable with limited mobility    OT Diagnosis:    OT Problem List:   OT Treatment Interventions:     OT Goals Acute Rehab OT Goals OT Goal Formulation: With patient Time For Goal Achievement: 10/03/12 ADL Goals Pt Will Perform Lower Body Bathing: with min assist;Sitting, edge of bed;Other (comment);with adaptive equipment ADL Goal: Lower Body Bathing - Progress: Progressing toward goals Pt Will Perform Lower Body Dressing: with min assist;Sitting, bed;with adaptive equipment;Other (comment) ADL Goal: Lower Body Dressing - Progress: Progressing toward goals Pt Will Transfer to Toilet: with DME;Drop arm 3-in-1;Other (comment) ADL Goal: Toilet Transfer - Progress: Progressing toward goals Pt Will Perform Toileting - Clothing Manipulation: with supervision;Other (comment) ADL Goal: Toileting - Clothing Manipulation - Progress: Progressing toward goals Pt Will Perform Toileting - Hygiene: with supervision ADL Goal: Toileting - Hygiene - Progress: Progressing toward goals  Visit Information  Last OT Received On: 09/20/12 Assistance Needed: +1    Subjective Data      Prior Functioning       Cognition  Overall Cognitive Status: Appears within functional limits for tasks assessed/performed Arousal/Alertness: Awake/alert Orientation Level: Appears intact for tasks assessed Behavior During Session: [facility] for tasks performed    Mobility  Shoulder Instructions Bed Mobility Supine to Sit: 5: Supervision Sitting - Scoot to Edge of Bed: 5: Supervision Details for Bed Mobility Assistance: Patient requires increased time to slide LEs off of bed but able to complete with only cues for positioning Transfers Details for Transfer Assistance: Patient required very minimal assist to complete lateral transfer to chair. Able  to keep R LE off of ground. Patient with good  clearance of buttock with shifting weight. Patient practice scooting laterally on bed L and R prior to transferring and was able to scoot with supervision.        Exercises      Balance     End of Session OT - End of Session Equipment Utilized During Treatment: Gait belt Activity Tolerance: Patient tolerated treatment well Patient left: in chair;with call bell/phone within reach  GO     Deborah Cooke 09/20/2012, 11:06 AM

## 2012-09-22 LAB — BODY FLUID CULTURE: Culture: NO GROWTH

## 2012-09-22 NOTE — Discharge Summary (Signed)
Physician Discharge Summary  Patient ID: ASYIA HORNUNG MRN: [id number] DOB/AGE: 28-Mar-1951 62 y.o.  Admit date: 09/16/2012 Discharge date: 09/20/2012  Admission Diagnoses:  Closed supracondylar fracture of right femur  Discharge Diagnoses:  Principal Problem:  *Closed supracondylar fracture of right femur Active Problems:  Acute blood loss anemia  Chronic pain  Osteoporosis with fracture  Hypertension  Thyroid disease  Osteoporosis  Anxiety   Past Medical History  Diagnosis Date  . Hypertension   . Thyroid disease   . Osteoporosis   . Headache   . Anxiety   . Adverse effect of unspecified anesthetic, initial encounter 08/2011    low o2 sats.  . Adverse effect of anesthetic 01/2012    difficulty waking up.  . Closed supracondylar fracture of right femur 09/18/2012  . Osteoporosis with fracture 09/19/2012    Surgeries: Procedure(s): INTRAMEDULLARY (IM) RETROGRADE FEMORAL NAILING on 09/16/2012 - 09/18/2012   Consultants (if any):    Discharged Condition: Improved  Hospital Course: MCKYNLEE LUSE is an 62 y.o. female who was admitted 09/16/2012 with a diagnosis of Closed supracondylar fracture of right femur and went to the operating room on 09/16/2012 - 09/18/2012 and underwent the above named procedures.    She was given perioperative antibiotics:  Anti-infectives     Start     Dose/Rate Route Frequency Ordered Stop   09/18/12 2330   ceFAZolin (ANCEF) IVPB 2 g/50 mL premix        2 g 100 mL/hr over 30 Minutes Intravenous Every 6 hours 09/18/12 2141 09/19/12 0554   09/18/12 1717   ceFAZolin (ANCEF) IVPB 2 g/50 mL premix  Status:  Discontinued        2 g 100 mL/hr over 30 Minutes Intravenous 30 min pre-op 09/18/12 1717 09/18/12 2141        .  She was given sequential compression devices, early ambulation, and lovenox for DVT prophylaxis.  She benefited maximally from the hospital stay and there were no complications.    Recent vital signs:  Filed Vitals:   09/20/12 1300  BP: 108/69  Pulse: 18  Temp: 98.9 F (37.2 C)  Resp: 18    Recent laboratory studies:  Lab Results  Component Value Date   HGB 10.0* 09/20/2012   HGB 9.1* 09/19/2012   HGB 12.2 09/16/2012   Lab Results  Component Value Date   WBC 7.4 09/20/2012   PLT 261 09/20/2012   No results found for this basename: INR   Lab Results  Component Value Date   NA 138 09/20/2012   K 3.6 09/20/2012   CL 102 09/20/2012   CO2 27 09/20/2012   BUN 9 09/20/2012   CREATININE 0.62 09/20/2012   GLUCOSE 112* 09/20/2012    Discharge Medications:     Medication List     As of 09/22/2012  6:13 PM    TAKE these medications         ALPRAZolam 1 MG tablet   Commonly known as: XANAX   Take 1 mg by mouth 4 (four) times daily as needed. For anxiety      B COMPLEX 100 PO   Take 1 tablet by mouth daily.      Biotin 1000 MCG tablet   Take 1,000 mcg by mouth 2 (two) times daily.      buPROPion 200 MG 12 hr tablet   Commonly known as: WELLBUTRIN SR   Take 200 mg by mouth 2 (two) times daily.      CALCIUM 600 PO  Take 1 tablet by mouth 3 (three) times daily.      cyclobenzaprine 10 MG tablet   Commonly known as: FLEXERIL   Take 10 mg by mouth 3 (three) times daily as needed. For muscle spasms      enoxaparin 40 MG/0.4ML injection   Commonly known as: LOVENOX   Inject 0.4 mLs (40 mg total) into the skin daily.      furosemide 20 MG tablet   Commonly known as: LASIX   Take 20 mg by mouth daily.      gabapentin 800 MG tablet   Commonly known as: NEURONTIN   Take 800 mg by mouth 2 (two) times daily.      HYDROmorphone 2 MG tablet   Commonly known as: DILAUDID   Take 1 tablet (2 mg total) by mouth every 4 (four) hours as needed for pain.      levothyroxine 50 MCG tablet   Commonly known as: SYNTHROID, LEVOTHROID   Take 50 mcg by mouth daily.      liothyronine 25 MCG tablet   Commonly known as: CYTOMEL   Take 25 mcg by mouth daily.      LORazepam 2 MG tablet   Commonly known as:  ATIVAN   Take 4 mg by mouth at bedtime.      MULTIVITAMIN/IRON PO   Take 1 tablet by mouth 2 (two) times daily.      oxycodone 30 MG immediate release tablet   Commonly known as: ROXICODONE   Take 30 mg by mouth every 4 (four) hours as needed. For pain. Do not exceed 5 tabs in a day.      oxyCODONE-acetaminophen 10-325 MG per tablet   Commonly known as: PERCOCET   Take 1-2 tablets by mouth every 6 (six) hours as needed for pain. MAXIMUM TOTAL ACETAMINOPHEN DOSE IS 4000 MG PER DAY      potassium chloride 10 MEQ tablet   Commonly known as: K-DUR,KLOR-CON   Take 10 mEq by mouth daily.      QUEtiapine 300 MG 24 hr tablet   Commonly known as: SEROQUEL XR   Take 300 mg by mouth daily.      ranitidine 300 MG tablet   Commonly known as: ZANTAC   Take 300 mg by mouth daily.      sennosides-docusate sodium 8.6-50 MG tablet   Commonly known as: SENOKOT-S   Take 1 tablet by mouth daily.      tiZANidine 4 MG tablet   Commonly known as: ZANAFLEX   Take 4-8 mg by mouth at bedtime.      Vitamin D (Ergocalciferol) 50000 UNITS Caps   Commonly known as: DRISDOL   Take 50,000 Units by mouth every 7 (seven) days. Does not take on any specific day of the week        Diagnostic Studies: Dg Chest 1 View  09/16/2012  *RADIOLOGY REPORT*  Clinical Data: Preop for right knee surgery.  CHEST - 1 VIEW  Comparison: 09/16/2009  Findings: Lower cervical spine fixation.  Surgical clips at the region of the gastroesophageal junction.  Irregularity of the proximal right humerus is favored to be projectional or degenerative.  Mild cardiomegaly with tortuous descending thoracic aorta. No pleural effusion or pneumothorax.  No congestive failure.  Mild nonspecific and chronic interstitial thickening.  Mild scarring or atelectasis at the left lung base.  IMPRESSION: Cardiomegaly chronic interstitial thickening. No acute findings.   Original Report Authenticated By: Jeronimo Greaves, M.D.    Dg Femur  Right  09/18/2012  *RADIOLOGY REPORT*  Clinical Data: 62 year old female.  Right femur fracture.  RIGHT FEMUR - 2 VIEW,DG C-ARM 61-120 MIN  Technique: Four intraoperative fluoroscopic views of the right femur.  Comparison:  09/17/2012.  Findings: Preexisting right total knee arthroplasty.  Right femoral intramedullary rod placed with a single proximal interlocking screw and joule distal interlocking screws.  These traverse the metadiaphysis fracture site.  Near anatomic alignment.  Hardware appears intact.  IMPRESSION: ORIF distal right femur fracture with no adverse features.   Original Report Authenticated By: Erskine Speed, M.D.    Dg Femur Right  09/17/2012  *RADIOLOGY REPORT*  Clinical Data: Trauma, distal femur fracture  RIGHT FEMUR - 2 VIEW  Comparison: Prior radiographs 09/16/2012  Findings: The external traction device has been removed and replaced with an external straight leg brace.  Redemonstration of a mildly comminuted fracture of the distal femoral metaphysis. Images remains slightly degraded secondary to the overlying the brace.  Surgical changes of prior total knee arthroplasty again noted.  Alignment of the knee has not significantly changed since the prior study.  IMPRESSION:  1.  Similar appearance of mildly comminuted periprosthetic fracture of the distal femur in this patient with surgical changes of prior total knee arthroplasty. 2.  Interval exchange of external traction device for an external straight leg brace.   Original Report Authenticated By: Malachy Moan, M.D.    Dg Femur Right  09/16/2012  *RADIOLOGY REPORT*  Clinical Data: Fall.  Leg pain.  RIGHT FEMUR - 2 VIEW  Comparison: None.  Findings: The patient is status post right knee replacement.  There is a nondisplaced, mildly comminuted fracture through the distal right femur just above the knee replacement.  Suspect a moderate-to- large joint effusion within the right knee although the lateral view is somewhat obliqued due  to traction device.  No proximal femoral abnormality.  IMPRESSION: Mildly comminuted, nondisplaced distal right femoral fracture. Right knee replacement.   Original Report Authenticated By: Charlett Nose, M.D.    Dg Knee 1-2 Views Right  09/16/2012  *RADIOLOGY REPORT*  Clinical Data: Fall.  Knee pain.  RIGHT KNEE - 1-2 VIEW  Comparison: Right femoral series performed today.  Findings: The  there is a mildly comminuted, essentially nondisplaced fracture through the distal right femur.  Suspect moderate to large right joint effusion although the lateral view is suboptimal lead position due to traction device.  Remote right knee replacement changes.  IMPRESSION: Nondisplaced mildly comminuted distal right femoral fracture.   Original Report Authenticated By: Charlett Nose, M.D.    Dg Femur Right Port  09/18/2012  *RADIOLOGY REPORT*  Clinical Data: 62 year old female status post ORIF.  PORTABLE RIGHT FEMUR - 2 VIEW  Comparison: Intraoperative films 1854 hours the same day and earlier. Postoperative right knee radiographs.  Findings: Portable and cross-table lateral views of the proximal and mid right femur.  Intramedullary rod in place.  Proximal interlocking cortical screw.  Visible hardware intact.  Proximal femur intact.  IMPRESSION: Right femur ORIF with no adverse features.   Original Report Authenticated By: Erskine Speed, M.D.    Dg Knee Complete 4 Views Right  09/17/2012  *RADIOLOGY REPORT*  Clinical Data: Fall, distal femur fracture  RIGHT KNEE - COMPLETE 4+ VIEW  Comparison: Concurrently obtained radiographs of the femur, recent radiographs dated 09/16/2012  Findings: Images mildly degraded secondary to external straight leg brace.  Redemonstration of a mildly comminuted periprosthetic fracture of the distal femur.  Surgical changes of total knee arthroplasty  again noted.  Alignment of the knee appears congruent. The fracture fragments are mildly displaced.  IMPRESSION:  Limited evaluation of distal  femoral periprosthetic fracture given the external brace.  If further imaging detail is clinically warranted, CT scan could further evaluate.   Original Report Authenticated By: Malachy Moan, M.D.    Dg Knee Right Port  09/18/2012  *RADIOLOGY REPORT*  Clinical Data: 62 year old female postop intramedullary femoral nail.  PORTABLE RIGHT KNEE - 1-2 VIEW  Comparison: Intraoperative films 1854 hours same day.  Findings: Portable AP and cross-table lateral views.  New femur intramedullary rod, the distal aspect which is visualized with two interlocking screws.  Comminuted fracture of the medial distal right femoral metadiaphysis re-identified.  Stable to improved alignment fracture fragment.  Preexisting total knee arthroplasty appears stable.  IMPRESSION: Postoperative changes at the distal right femur.  No adverse features identified.   Original Report Authenticated By: Erskine Speed, M.D.    Dg C-arm [phone] Min  09/18/2012  *RADIOLOGY REPORT*  Clinical Data: 62 year old female.  Right femur fracture.  RIGHT FEMUR - 2 VIEW,DG C-ARM 61-120 MIN  Technique: Four intraoperative fluoroscopic views of the right femur.  Comparison:  09/17/2012.  Findings: Preexisting right total knee arthroplasty.  Right femoral intramedullary rod placed with a single proximal interlocking screw and joule distal interlocking screws.  These traverse the metadiaphysis fracture site.  Near anatomic alignment.  Hardware appears intact.  IMPRESSION: ORIF distal right femur fracture with no adverse features.   Original Report Authenticated By: Erskine Speed, M.D.     Disposition: 01-Home or Self Care      Discharge Orders    Future Orders Please Complete By Expires   Diet general      Non weight bearing      Call MD / Call 911      Comments:   If you experience chest pain or shortness of breath, CALL 911 and be transported to the hospital emergency room.  If you develope a fever above 101 F, pus (white drainage) or increased  drainage or redness at the wound, or calf pain, call your surgeon's office.   Discharge instructions      Comments:   Change dressing in 3 days and reapply fresh dressing, unless you have a splint (half cast).  If you have a splint/cast, just leave in place until your follow-up appointment.    Keep wounds dry for 3 weeks.  Leave steri-strips in place on skin.  Do not apply lotion or anything to the wound.   Constipation Prevention      Comments:   Drink plenty of fluids.  Prune juice may be helpful.  You may use a stool softener, such as Colace (over the counter) 100 mg twice a day.  Use MiraLax (over the counter) for constipation as needed.      Follow-up Information    Follow up with Eulas Post, MD. Schedule an appointment as soon as possible for a visit in 2 weeks.   Contact information:   [address] [place] [address] [phone]           Signed: Eulas Post 09/22/2012, 6:13 PM

## 2012-09-23 LAB — ANAEROBIC CULTURE

## 2012-11-07 ENCOUNTER — Ambulatory Visit: Payer: Medicare Other | Admitting: Physical Therapy

## 2012-11-08 ENCOUNTER — Ambulatory Visit: Payer: Medicare Other | Attending: Orthopedic Surgery | Admitting: Physical Therapy

## 2012-11-08 DIAGNOSIS — M25569 Pain in unspecified knee: Secondary | ICD-10-CM | POA: Insufficient documentation

## 2012-11-08 DIAGNOSIS — M6281 Muscle weakness (generalized): Secondary | ICD-10-CM | POA: Insufficient documentation

## 2012-11-08 DIAGNOSIS — R262 Difficulty in walking, not elsewhere classified: Secondary | ICD-10-CM | POA: Insufficient documentation

## 2012-11-08 DIAGNOSIS — IMO0001 Reserved for inherently not codable concepts without codable children: Secondary | ICD-10-CM | POA: Insufficient documentation

## 2012-11-14 ENCOUNTER — Ambulatory Visit: Payer: Medicare Other | Admitting: Physical Therapy

## 2012-11-20 ENCOUNTER — Ambulatory Visit: Payer: BC Managed Care – PPO | Admitting: Physical Therapy

## 2012-11-21 ENCOUNTER — Ambulatory Visit: Payer: BC Managed Care – PPO | Attending: Orthopedic Surgery | Admitting: Physical Therapy

## 2012-11-21 DIAGNOSIS — M25569 Pain in unspecified knee: Secondary | ICD-10-CM | POA: Insufficient documentation

## 2012-11-21 DIAGNOSIS — R262 Difficulty in walking, not elsewhere classified: Secondary | ICD-10-CM | POA: Insufficient documentation

## 2012-11-21 DIAGNOSIS — IMO0001 Reserved for inherently not codable concepts without codable children: Secondary | ICD-10-CM | POA: Insufficient documentation

## 2012-11-21 DIAGNOSIS — M6281 Muscle weakness (generalized): Secondary | ICD-10-CM | POA: Insufficient documentation

## 2012-11-27 ENCOUNTER — Ambulatory Visit: Payer: BC Managed Care – PPO | Admitting: Physical Therapy

## 2012-11-30 ENCOUNTER — Ambulatory Visit: Payer: BC Managed Care – PPO | Admitting: Physical Therapy

## 2012-12-05 ENCOUNTER — Ambulatory Visit: Payer: BC Managed Care – PPO | Admitting: Physical Therapy

## 2012-12-08 ENCOUNTER — Ambulatory Visit: Payer: BC Managed Care – PPO | Admitting: Rehabilitation

## 2012-12-12 ENCOUNTER — Encounter: Payer: Medicare Other | Admitting: Rehabilitation

## 2012-12-14 ENCOUNTER — Ambulatory Visit: Payer: BC Managed Care – PPO | Admitting: Rehabilitation

## 2012-12-19 ENCOUNTER — Ambulatory Visit: Payer: PRIVATE HEALTH INSURANCE | Attending: Orthopedic Surgery | Admitting: Rehabilitation

## 2012-12-19 DIAGNOSIS — M25569 Pain in unspecified knee: Secondary | ICD-10-CM | POA: Insufficient documentation

## 2012-12-19 DIAGNOSIS — M6281 Muscle weakness (generalized): Secondary | ICD-10-CM | POA: Insufficient documentation

## 2012-12-19 DIAGNOSIS — R262 Difficulty in walking, not elsewhere classified: Secondary | ICD-10-CM | POA: Insufficient documentation

## 2012-12-19 DIAGNOSIS — IMO0001 Reserved for inherently not codable concepts without codable children: Secondary | ICD-10-CM | POA: Insufficient documentation

## 2012-12-21 ENCOUNTER — Ambulatory Visit: Payer: PRIVATE HEALTH INSURANCE | Admitting: Physical Therapy

## 2012-12-25 ENCOUNTER — Ambulatory Visit: Payer: PRIVATE HEALTH INSURANCE | Admitting: Physical Therapy

## 2013-01-03 ENCOUNTER — Ambulatory Visit: Payer: PRIVATE HEALTH INSURANCE | Admitting: Physical Therapy

## 2013-01-04 ENCOUNTER — Ambulatory Visit: Payer: PRIVATE HEALTH INSURANCE | Admitting: Physical Therapy

## 2013-01-09 ENCOUNTER — Encounter: Payer: Medicare Other | Admitting: Physical Therapy

## 2013-01-18 ENCOUNTER — Ambulatory Visit: Payer: BC Managed Care – PPO | Admitting: Physical Therapy

## 2014-03-20 ENCOUNTER — Ambulatory Visit: Payer: Medicare Other | Attending: Family Medicine | Admitting: Rehabilitation

## 2014-03-20 DIAGNOSIS — F329 Major depressive disorder, single episode, unspecified: Secondary | ICD-10-CM | POA: Insufficient documentation

## 2014-03-20 DIAGNOSIS — IMO0001 Reserved for inherently not codable concepts without codable children: Secondary | ICD-10-CM | POA: Diagnosis not present

## 2014-03-20 DIAGNOSIS — M810 Age-related osteoporosis without current pathological fracture: Secondary | ICD-10-CM | POA: Diagnosis not present

## 2014-03-20 DIAGNOSIS — IMO0002 Reserved for concepts with insufficient information to code with codable children: Secondary | ICD-10-CM | POA: Insufficient documentation

## 2014-03-20 DIAGNOSIS — M25519 Pain in unspecified shoulder: Secondary | ICD-10-CM | POA: Insufficient documentation

## 2014-03-20 DIAGNOSIS — M7500 Adhesive capsulitis of unspecified shoulder: Secondary | ICD-10-CM | POA: Insufficient documentation

## 2014-03-20 DIAGNOSIS — F3289 Other specified depressive episodes: Secondary | ICD-10-CM | POA: Insufficient documentation

## 2014-03-20 DIAGNOSIS — R51 Headache: Secondary | ICD-10-CM | POA: Insufficient documentation

## 2014-03-27 ENCOUNTER — Ambulatory Visit: Payer: Medicare Other | Admitting: Rehabilitation

## 2014-03-27 DIAGNOSIS — IMO0001 Reserved for inherently not codable concepts without codable children: Secondary | ICD-10-CM | POA: Diagnosis not present

## 2014-04-03 ENCOUNTER — Ambulatory Visit: Payer: Medicare Other | Admitting: Rehabilitation

## 2014-04-10 ENCOUNTER — Ambulatory Visit: Payer: Medicare Other | Admitting: Rehabilitation

## 2014-04-17 ENCOUNTER — Ambulatory Visit: Payer: Medicare Other | Admitting: Rehabilitation

## 2015-11-11 ENCOUNTER — Other Ambulatory Visit (HOSPITAL_BASED_OUTPATIENT_CLINIC_OR_DEPARTMENT_OTHER): Payer: Self-pay | Admitting: Family Medicine

## 2015-11-11 DIAGNOSIS — Z1231 Encounter for screening mammogram for malignant neoplasm of breast: Secondary | ICD-10-CM

## 2015-11-17 ENCOUNTER — Ambulatory Visit (HOSPITAL_BASED_OUTPATIENT_CLINIC_OR_DEPARTMENT_OTHER): Payer: Medicare Other

## 2015-11-18 ENCOUNTER — Ambulatory Visit (HOSPITAL_BASED_OUTPATIENT_CLINIC_OR_DEPARTMENT_OTHER)
Admission: RE | Admit: 2015-11-18 | Discharge: 2015-11-18 | Disposition: A | Discharge status: Home / Self Care | Payer: Medicare Other | Source: Ambulatory Visit | Attending: Family Medicine | Admitting: Family Medicine

## 2015-11-18 DIAGNOSIS — Z1231 Encounter for screening mammogram for malignant neoplasm of breast: Secondary | ICD-10-CM

## 2016-03-22 ENCOUNTER — Emergency Department (HOSPITAL_BASED_OUTPATIENT_CLINIC_OR_DEPARTMENT_OTHER): Payer: Medicare Other

## 2016-03-22 ENCOUNTER — Emergency Department (HOSPITAL_BASED_OUTPATIENT_CLINIC_OR_DEPARTMENT_OTHER)
Admission: EM | Admit: 2016-03-22 | Discharge: 2016-03-22 | Disposition: A | Discharge status: Home / Self Care | Payer: Medicare Other | Attending: Emergency Medicine | Admitting: Emergency Medicine

## 2016-03-22 ENCOUNTER — Encounter (HOSPITAL_BASED_OUTPATIENT_CLINIC_OR_DEPARTMENT_OTHER): Payer: Self-pay | Admitting: *Deleted

## 2016-03-22 DIAGNOSIS — W1809XA Striking against other object with subsequent fall, initial encounter: Secondary | ICD-10-CM | POA: Insufficient documentation

## 2016-03-22 DIAGNOSIS — Y999 Unspecified external cause status: Secondary | ICD-10-CM | POA: Insufficient documentation

## 2016-03-22 DIAGNOSIS — R05 Cough: Secondary | ICD-10-CM | POA: Insufficient documentation

## 2016-03-22 DIAGNOSIS — Z79891 Long term (current) use of opiate analgesic: Secondary | ICD-10-CM | POA: Insufficient documentation

## 2016-03-22 DIAGNOSIS — S2232XA Fracture of one rib, left side, initial encounter for closed fracture: Secondary | ICD-10-CM | POA: Diagnosis not present

## 2016-03-22 DIAGNOSIS — S299XXA Unspecified injury of thorax, initial encounter: Secondary | ICD-10-CM | POA: Diagnosis present

## 2016-03-22 DIAGNOSIS — Y939 Activity, unspecified: Secondary | ICD-10-CM | POA: Diagnosis not present

## 2016-03-22 DIAGNOSIS — Z79899 Other long term (current) drug therapy: Secondary | ICD-10-CM | POA: Insufficient documentation

## 2016-03-22 DIAGNOSIS — Y92002 Bathroom of unspecified non-institutional (private) residence single-family (private) house as the place of occurrence of the external cause: Secondary | ICD-10-CM | POA: Diagnosis not present

## 2016-03-22 DIAGNOSIS — I10 Essential (primary) hypertension: Secondary | ICD-10-CM | POA: Diagnosis not present

## 2016-03-22 DIAGNOSIS — R0602 Shortness of breath: Secondary | ICD-10-CM | POA: Diagnosis not present

## 2016-03-22 LAB — CBC WITH DIFFERENTIAL/PLATELET
Basophils Absolute: 0 10*3/uL (ref 0.0–0.1)
Basophils Relative: 0 %
Eosinophils Absolute: 0.1 10*3/uL (ref 0.0–0.7)
Eosinophils Relative: 1 %
HCT: 31.5 % — ABNORMAL LOW (ref 36.0–46.0)
Hemoglobin: 10.2 g/dL — ABNORMAL LOW (ref 12.0–15.0)
Lymphocytes Relative: 12 %
Lymphs Abs: 1.1 10*3/uL (ref 0.7–4.0)
MCH: 27.7 pg (ref 26.0–34.0)
MCHC: 32.4 g/dL (ref 30.0–36.0)
MCV: 85.6 fL (ref 78.0–100.0)
Monocytes Absolute: 0.7 10*3/uL (ref 0.1–1.0)
Monocytes Relative: 7 %
Neutro Abs: 7.6 10*3/uL (ref 1.7–7.7)
Neutrophils Relative %: 80 %
Platelets: 312 10*3/uL (ref 150–400)
RBC: 3.68 MIL/uL — ABNORMAL LOW (ref 3.87–5.11)
RDW: 14.9 % (ref 11.5–15.5)
WBC: 9.5 10*3/uL (ref 4.0–10.5)

## 2016-03-22 LAB — BASIC METABOLIC PANEL
ANION GAP: 6 (ref 5–15)
BUN: 19 mg/dL (ref 6–20)
CHLORIDE: 103 mmol/L (ref 101–111)
CO2: 29 mmol/L (ref 22–32)
Calcium: 8.4 mg/dL — ABNORMAL LOW (ref 8.9–10.3)
Creatinine, Ser: 0.76 mg/dL (ref 0.44–1.00)
GFR calc non Af Amer: >60 mL/min (ref >60)
Glucose, Bld: 100 mg/dL — ABNORMAL HIGH (ref 65–99)
POTASSIUM: 3.6 mmol/L (ref 3.5–5.1)
SODIUM: 138 mmol/L (ref 135–145)

## 2016-03-22 MED ORDER — IOPAMIDOL (ISOVUE-300) INJECTION 61%
100.0000 mL | Freq: Once | INTRAVENOUS | Status: AC | PRN
  Administered 2016-03-22: 100 mL via INTRAVENOUS

## 2016-03-22 MED ORDER — HYDROMORPHONE HCL 1 MG/ML IJ SOLN
1.0000 mg | Freq: Once | INTRAMUSCULAR | Status: AC
  Administered 2016-03-22: 1 mg via INTRAVENOUS
  Filled 2016-03-22: qty 1

## 2016-03-22 NOTE — ED Notes (Signed)
States she fell in the night hitting her left chest. She slept on the bathroom floor. Sob since the fall. She drove herself here.

## 2016-03-22 NOTE — ED Notes (Signed)
Pt placed on auto vitals Q30. Patient placed on cardiac monitor.  

## 2016-03-22 NOTE — ED Provider Notes (Signed)
CSN: [id number]     Arrival date & time 03/22/16  1334 History  By signing my name below, I, Deborah Cooke, attest that this documentation has been prepared under the direction and in the presence of Deborah Dessert, MD . Electronically Signed: Dyke Cooke, Scribe. 03/22/2016. 3:56 PM.   Chief Complaint  Patient presents with  . Fall  . Shortness of Breath    The history is provided by the patient. No language interpreter was used.    HPI Comments:  Deborah Cooke is a 65 y.o. female who presents to the Emergency Department complaining of sudden onset, moderate, left sided rib pain with radiation to left arm onset last night s/p fall. Pt states she had a "bad fall" in the middle of the night, hitting left side on the door. She states she was in too much pain to get up and slept on the bathroom floor last night. Per pt, this morning she had difficulty ambulating, and getting dressed. She also notes associated cough today and shortness of breath which has improved. Pt takes 30 mg of oxycodone up to 5x day daily. She has not taken any today.  No alleviating factors noted. Pt is allergic to NSAIDs.She denies LOC, other chest pain, leg pain, headache, or abdomen pain.  Past Medical History  Diagnosis Date  . Hypertension   . Thyroid disease   . Osteoporosis   . Headache(784.0)   . Anxiety   . Adverse effect of unspecified anesthetic, initial encounter 08/2011    low o2 sats.  . Adverse effect of anesthetic 01/2012    difficulty waking up.  . Closed supracondylar fracture of right femur ([place]) 09/18/2012  . Osteoporosis with fracture 09/19/2012   Past Surgical History  Procedure Laterality Date  . Wrist fracture surgery  08/2011  . Cholecystectomy  10/1975  . Gastric bypass  2003  . Replacement total knee  2003    right and left -4 months aoart.  . Joint replacement    . Tonsillectomy    . Femur im nail  09/18/2012    Procedure: INTRAMEDULLARY (IM) RETROGRADE FEMORAL NAILING;  Surgeon:  [name], MD;  Location: [facility];  Service: Orthopedics;  Laterality: Right;   No family history on file. Social History  Substance Use Topics  . Smoking status: Never Smoker   . Smokeless tobacco: Former Systems developer    Quit date: 01/17/1989  . Alcohol Use: No   OB History    No data available     Review of Systems  Respiratory: Positive for cough and shortness of breath.   Cardiovascular: Negative for chest pain.  Gastrointestinal: Negative for abdominal pain.  Musculoskeletal: Positive for myalgias and arthralgias.  Neurological: Negative for syncope and headaches.  All other systems reviewed and are negative.     Allergies  Nsaids  Home Medications   Prior to Admission medications   Medication Sig Start Date End Date Taking? Authorizing Provider  ALPRAZolam Duanne Moron) 1 MG tablet Take 1 mg by mouth 4 (four) times daily as needed. For anxiety   Yes Historical Provider, MD  B Complex Vitamins (B COMPLEX 100 PO) Take 1 tablet by mouth daily.   Yes Historical Provider, MD  buPROPion (WELLBUTRIN SR) 200 MG 12 hr tablet Take 200 mg by mouth 2 (two) times daily.   Yes Historical Provider, MD  Calcium Carbonate (CALCIUM 600 PO) Take 1 tablet by mouth 3 (three) times daily.   Yes Historical Provider, MD  cyclobenzaprine (FLEXERIL) 10 MG  tablet Take 10 mg by mouth 3 (three) times daily as needed. For muscle spasms   Yes Historical Provider, MD  gabapentin (NEURONTIN) 800 MG tablet Take 800 mg by mouth 2 (two) times daily.   Yes Historical Provider, MD  levothyroxine (SYNTHROID, LEVOTHROID) 50 MCG tablet Take 50 mcg by mouth daily.   Yes Historical Provider, MD  liothyronine (CYTOMEL) 25 MCG tablet Take 25 mcg by mouth daily.   Yes Historical Provider, MD  LORazepam (ATIVAN) 2 MG tablet Take 4 mg by mouth at bedtime.   Yes Historical Provider, MD  Multiple Vitamins-Iron (MULTIVITAMIN/IRON PO) Take 1 tablet by mouth 2 (two) times daily.   Yes Historical Provider, MD  NORTRIPTYLINE HCL PO  Take by mouth.   Yes Historical Provider, MD  oxycodone (ROXICODONE) 30 MG immediate release tablet Take 30 mg by mouth every 4 (four) hours as needed. For pain. Do not exceed 5 tabs in a day.   Yes Historical Provider, MD  oxyCODONE-acetaminophen (PERCOCET) 10-325 MG per tablet Take 1-2 tablets by mouth every 6 (six) hours as needed for pain. MAXIMUM TOTAL ACETAMINOPHEN DOSE IS 4000 MG PER DAY 09/18/12  Yes Marchia Bond, MD  potassium chloride (K-DUR,KLOR-CON) 10 MEQ tablet Take 10 mEq by mouth daily.   Yes Historical Provider, MD  QUEtiapine (SEROQUEL XR) 300 MG 24 hr tablet Take 300 mg by mouth daily.   Yes Historical Provider, MD  ranitidine (ZANTAC) 300 MG tablet Take 300 mg by mouth daily.   Yes Historical Provider, MD  Vitamin D, Ergocalciferol, (DRISDOL) 50000 UNITS CAPS Take 50,000 Units by mouth every 7 (seven) days. Does not take on any specific day of the week   Yes Historical Provider, MD  Biotin 1000 MCG tablet Take 1,000 mcg by mouth 2 (two) times daily.    Historical Provider, MD  enoxaparin (LOVENOX) 40 MG/0.4ML injection Inject 0.4 mLs (40 mg total) into the skin daily. 09/18/12   Marchia Bond, MD  furosemide (LASIX) 20 MG tablet Take 20 mg by mouth daily.    Historical Provider, MD  HYDROmorphone (DILAUDID) 2 MG tablet Take 1 tablet (2 mg total) by mouth every 4 (four) hours as needed for pain. 09/18/12   Marchia Bond, MD  sennosides-docusate sodium (SENOKOT-S) 8.6-50 MG tablet Take 1 tablet by mouth daily. 09/18/12   Marchia Bond, MD  tiZANidine (ZANAFLEX) 4 MG tablet Take 4-8 mg by mouth at bedtime.    Historical Provider, MD   BP 152/92 mmHg  Pulse 95  Temp(Src) 98.6 F (37 C) (Oral)  Resp 20  Ht 4' 11.75" (1.518 m)  Wt 197 lb (89.359 kg)  BMI 38.78 kg/m2  SpO2 95% Physical Exam  Constitutional: She is oriented to person, place, and time. She appears well-developed and well-nourished. No distress.  HENT:  Head: Normocephalic and atraumatic.  Right Ear: Hearing  normal.  Left Ear: Hearing normal.  Nose: Nose normal.  Mouth/Throat: Oropharynx is clear and moist and mucous membranes are normal.  Eyes: Conjunctivae and EOM are normal. Pupils are equal, round, and reactive to light.  Neck: Normal range of motion. Neck supple.  No cspine tenderness  Cardiovascular: Regular rhythm, S1 normal and S2 normal.  Exam reveals no gallop and no friction rub.   No murmur heard. Pulmonary/Chest: Effort normal and breath sounds normal. No respiratory distress. She exhibits tenderness.  Breath sounds equal Pain with guarding in left chest Left chest no crepitus  Abdominal: Soft. Normal appearance and bowel sounds are normal. There is no hepatosplenomegaly.  There is tenderness. There is no rebound, no guarding, no tenderness at McBurney's point and negative Murphy's sign. No hernia.  No ecchymosis to abdomen   Musculoskeletal: Normal range of motion.  Neurological: She is alert and oriented to person, place, and time. She has normal strength. No cranial nerve deficit or sensory deficit. Coordination normal. GCS eye subscore is 4. GCS verbal subscore is 5. GCS motor subscore is 6.  Skin: Skin is warm, dry and intact. No rash noted. No cyanosis.  superfical abrasions on bilateral dorsal forearms  Psychiatric: She has a normal mood and affect. Her speech is normal and behavior is normal. Thought content normal.  Nursing note and vitals reviewed.   ED Course  Procedures  DIAGNOSTIC STUDIES:  Oxygen Saturation is 95% on RA, adequate by my interpretation.    COORDINATION OF CARE:  3:06 PM Will order CT abdomen, CBC, BMP, DG ribs and CT chest. Discussed treatment plan with pt at bedside and pt agreed to plan.  Labs Review Labs Reviewed  CBC WITH DIFFERENTIAL/PLATELET - Abnormal; Notable for the following:    RBC 3.68 (*)    Hemoglobin 10.2 (*)    HCT 31.5 (*)    All other components within normal limits  BASIC METABOLIC PANEL - Abnormal; Notable for the  following:    Glucose, Bld 100 (*)    Calcium 8.4 (*)    All other components within normal limits    Imaging Review Dg Ribs Unilateral W/chest Left  03/22/2016  CLINICAL DATA:  Fall, hit door last night.  Left rib pain. EXAM: LEFT RIBS AND CHEST - 3+ VIEW COMPARISON:  09/16/2012 FINDINGS: Rounded opacity noted in the right upper lobe. This likely reflects pneumonia, but follow-up after treatment is recommended. Patchy right lower lobe airspace opacities as well. Left lung is clear. Heart is upper limits normal in size. No effusions. No acute bony abnormality. No visible rib fracture. No pneumothorax. IMPRESSION: Right upper lobe and to a lesser extent right lower lobe airspace opacities, likely pneumonia. Followup PA and lateral chest X-ray is recommended in 3-4 weeks following trial of antibiotic therapy to ensure resolution and exclude underlying malignancy. Electronically Signed   By: Rolm Baptise M.D.   On: 03/22/2016 14:05   Ct Chest W Contrast  03/22/2016  CLINICAL DATA:  Fall last night. EXAM: CT CHEST, ABDOMEN, AND PELVIS WITH CONTRAST TECHNIQUE: Multidetector CT imaging of the chest, abdomen and pelvis was performed following the standard protocol during bolus administration of intravenous contrast. CONTRAST:  153mL ISOVUE-300 IOPAMIDOL (ISOVUE-300) INJECTION 61% COMPARISON:  None. FINDINGS: CT CHEST FINDINGS Mediastinum/Lymph Nodes: The heart size is normal. The transverse diameter of the main pulmonary artery is 4.2 cm, image 24 series 2. No pericardial effusion. The thoracic aorta appears intact. Aortic atherosclerosis noted. Calcification involving the RCA, LAD and left circumflex coronary artery is identified. The right paratracheal lymph node measures 1.3 cm. No hilar adenopathy identified. No axillary or supraclavicular adenopathy. Lungs/Pleura: There is a small left pleural effusion. Multifocal ground-glass and airspace densities are noted throughout the right lung and left lower lobe.  Musculoskeletal: Anterior left rib fractures are identified and appear nondisplaced, image number 66 of series 4 and image number 78 of series 4. There is spondylosis noted throughout the thoracic spine. CT ABDOMEN PELVIS FINDINGS Hepatobiliary: Mild intrahepatic bile duct dilatation. The common bile duct measures 8 mm in maximum diameter. No focal liver abnormality identified. Pancreas: No mass, inflammatory changes, or other significant abnormality. Spleen: Within normal limits in  size and appearance. Adrenals/Urinary Tract: There is a right kidney cyst which measures 1.3 cm, image 75 of series 2. The left kidney is unremarkable. Urinary bladder appears normal. 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 No abnormal bowel dilatation identified. There is a moderate stool burden identified within the colon. Vascular/Lymphatic: Calcified atherosclerotic disease involves the abdominal aorta. No aneurysm. No enlarged retroperitoneal or mesenteric adenopathy. No enlarged pelvic or inguinal lymph nodes. Reproductive: No mass or other significant abnormality. Other: There is no ascites or focal fluid collections within the abdomen or pelvis. Musculoskeletal: Previous hardware fixation of the right femur. There is degenerative disc disease identified within the lumbar spine. Mild age indeterminate superior endplate compression deformity involves the L1 vertebra. Anterolisthesis of L5 on S1 noted. IMPRESSION: 1. Several nondisplaced left anterior rib fractures appear acute but nondisplaced. 2. Multi focal airspace densities and ground-glass attenuation identified within both lungs. Findings may be inflammatory or infectious in etiology. Asymmetric areas of alveolar edema are less favored. In the setting of trauma pulmonary contusion may also present as areas of ground-glass and airspace densities. 3. No acute findings  identified within the abdomen or pelvis. 4. Increase caliber of the common bile duct status post cholecystectomy. 5. Age-indeterminate L1 compression fracture. 6. Increase caliber of the pulmonary arteries suggestive of PA hypertension 7. Aortic atherosclerosis and multi vessel coronary artery calcification. 8. Thoracic and lumbar spondylosis. Electronically Signed   By: Kerby Moors M.D.   On: 03/22/2016 16:47   Ct Abdomen Pelvis W Contrast  03/22/2016  CLINICAL DATA:  Fall last night. EXAM: CT CHEST, ABDOMEN, AND PELVIS WITH CONTRAST TECHNIQUE: Multidetector CT imaging of the chest, abdomen and pelvis was performed following the standard protocol during bolus administration of intravenous contrast. CONTRAST:  168mL ISOVUE-300 IOPAMIDOL (ISOVUE-300) INJECTION 61% COMPARISON:  None. FINDINGS: CT CHEST FINDINGS Mediastinum/Lymph Nodes: The heart size is normal. The transverse diameter of the main pulmonary artery is 4.2 cm, image 24 series 2. No pericardial effusion. The thoracic aorta appears intact. Aortic atherosclerosis noted. Calcification involving the RCA, LAD and left circumflex coronary artery is identified. The right paratracheal lymph node measures 1.3 cm. No hilar adenopathy identified. No axillary or supraclavicular adenopathy. Lungs/Pleura: There is a small left pleural effusion. Multifocal ground-glass and airspace densities are noted throughout the right lung and left lower lobe. Musculoskeletal: Anterior left rib fractures are identified and appear nondisplaced, image number 66 of series 4 and image number 78 of series 4. There is spondylosis noted throughout the thoracic spine. CT ABDOMEN PELVIS FINDINGS Hepatobiliary: Mild intrahepatic bile duct dilatation. The common bile duct measures 8 mm in maximum diameter. No focal liver abnormality identified. Pancreas: No mass, inflammatory changes, or other significant abnormality. Spleen: Within normal limits in size and appearance. Adrenals/Urinary  Tract: There is a right kidney cyst which measures 1.3 cm, image 75 of series 2. The left kidney is unremarkable. Urinary bladder appears normal. 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 No abnormal bowel dilatation identified. There is a moderate stool burden identified within the colon. Vascular/Lymphatic: Calcified atherosclerotic disease involves the abdominal aorta. No aneurysm. No enlarged retroperitoneal or mesenteric adenopathy. No enlarged pelvic or inguinal lymph nodes. Reproductive: No mass or other significant abnormality. Other: There is no ascites or focal fluid collections within the abdomen or pelvis. Musculoskeletal: Previous hardware fixation of the right femur. There is degenerative disc disease  identified within the lumbar spine. Mild age indeterminate superior endplate compression deformity involves the L1 vertebra. Anterolisthesis of L5 on S1 noted. IMPRESSION: 1. Several nondisplaced left anterior rib fractures appear acute but nondisplaced. 2. Multi focal airspace densities and ground-glass attenuation identified within both lungs. Findings may be inflammatory or infectious in etiology. Asymmetric areas of alveolar edema are less favored. In the setting of trauma pulmonary contusion may also present as areas of ground-glass and airspace densities. 3. No acute findings identified within the abdomen or pelvis. 4. Increase caliber of the common bile duct status post cholecystectomy. 5. Age-indeterminate L1 compression fracture. 6. Increase caliber of the pulmonary arteries suggestive of PA hypertension 7. Aortic atherosclerosis and multi vessel coronary artery calcification. 8. Thoracic and lumbar spondylosis. Electronically Signed   By: Kerby Moors M.D.   On: 03/22/2016 16:47   I have personally reviewed and evaluated these images and lab results as part of my medical  decision-making.   EKG Interpretation   Date/Time:  Monday March 22 2016 14:22:55 EDT Ventricular Rate:  96 PR Interval:    QRS Duration: 105 QT Interval:  346 QTC Calculation: 438 R Axis:   21 Text Interpretation:  Sinus rhythm Normal ECG Confirmed by DELO  MD,  DOUGLAS ([id number]) on 03/22/2016 2:53:23 PM      MDM   Final diagnoses:  Left rib fracture, closed, initial encounter   Patient is a 65 year old female with chronic pain on daily narcotics who had a mechanical fall when going to the bathroom last night with injury to the left chest and upper abdomen. Denies any head injury, LOC or syncope. She was able to get up this morning and ambulate but has significant tenderness to the chest and upper abdomen. She denies any pain in the legs or pelvis. She does not take anticoagulation.  On exam she has significant point tenderness in the left chest and left upper quadrant. Concern for potential rib fractures or splenic laceration. She is currently hemodynamically stable and was given pain control. She has not taken any of her pain medication today. Chest x-ray showed concern for possible pneumonia in the right upper and middle lobe however she has not had a cough or any infectious symptoms. May be pulmonary contusion. CT of the chest and pelvis pending hemoglobin is stable and BMP within normal limits.  5:53 PM CT shows several nondisplaced left anterior rib fractures and groundglass attenuation which is probably aspiration. Patient has no infectious etiology and she has no right-sided chest pain. Patient's oxygen saturation has been between 91 and 98%. She is not tachypnea and she is in no acute distress. Patient was sent home with incentive spirometer but was also given strict return precautions if she develops shortness of breath, fever or productive cough. I personally performed the services described in this documentation, which was scribed in my presence.  The recorded information has been  reviewed and considered.     Deborah Dessert, MD 03/22/16 1754

## 2016-03-22 NOTE — Discharge Instructions (Signed)

## 2016-03-22 NOTE — ED Notes (Signed)
Pt states she got up to go to the bathroom in the night (time unknown) and believes she "must've tripped on something" and fell.  Pt remembers hitting her left rib cage on the door jam and states she slept on the bathroom floor, she presumes bc it was too painful to get up.  Pt states "I just woke up in the bathroom this morning."  Pt does not remember making active choice to sleep in the bathroom.  Pt states too painful to take a deep breath.  Pt denies other chest pain and states SOB since waking up this morning.

## 2016-04-20 ENCOUNTER — Encounter (HOSPITAL_COMMUNITY): Payer: Self-pay

## 2016-04-20 ENCOUNTER — Emergency Department (HOSPITAL_COMMUNITY): Payer: Medicare Other

## 2016-04-20 ENCOUNTER — Emergency Department (HOSPITAL_COMMUNITY)
Admission: EM | Admit: 2016-04-20 | Discharge: 2016-04-20 | Disposition: A | Discharge status: Home / Self Care | Payer: Medicare Other | Attending: Emergency Medicine | Admitting: Emergency Medicine

## 2016-04-20 DIAGNOSIS — R5383 Other fatigue: Secondary | ICD-10-CM

## 2016-04-20 DIAGNOSIS — Z87891 Personal history of nicotine dependence: Secondary | ICD-10-CM | POA: Insufficient documentation

## 2016-04-20 DIAGNOSIS — R4182 Altered mental status, unspecified: Secondary | ICD-10-CM | POA: Diagnosis not present

## 2016-04-20 DIAGNOSIS — Z96651 Presence of right artificial knee joint: Secondary | ICD-10-CM | POA: Insufficient documentation

## 2016-04-20 DIAGNOSIS — I10 Essential (primary) hypertension: Secondary | ICD-10-CM | POA: Insufficient documentation

## 2016-04-20 DIAGNOSIS — Z79899 Other long term (current) drug therapy: Secondary | ICD-10-CM | POA: Insufficient documentation

## 2016-04-20 LAB — CBC WITH DIFFERENTIAL/PLATELET
Basophils Absolute: 0 K/uL (ref 0.0–0.1)
Basophils Relative: 0 %
Eosinophils Absolute: 0.4 K/uL (ref 0.0–0.7)
Eosinophils Relative: 5 %
HCT: 33.6 % — ABNORMAL LOW (ref 36.0–46.0)
Hemoglobin: 10.6 g/dL — ABNORMAL LOW (ref 12.0–15.0)
Lymphocytes Relative: 42 %
Lymphs Abs: 2.9 K/uL (ref 0.7–4.0)
MCH: 26.9 pg (ref 26.0–34.0)
MCHC: 31.5 g/dL (ref 30.0–36.0)
MCV: 85.3 fL (ref 78.0–100.0)
Monocytes Absolute: 0.7 K/uL (ref 0.1–1.0)
Monocytes Relative: 10 %
Neutro Abs: 3 K/uL (ref 1.7–7.7)
Neutrophils Relative %: 43 %
Platelets: 275 K/uL (ref 150–400)
RBC: 3.94 MIL/uL (ref 3.87–5.11)
RDW: 15.1 % (ref 11.5–15.5)
WBC: 7 K/uL (ref 4.0–10.5)

## 2016-04-20 LAB — RAPID URINE DRUG SCREEN, HOSP PERFORMED
Amphetamines: NOT DETECTED
Barbiturates: NOT DETECTED
Benzodiazepines: POSITIVE — AB
Cocaine: NOT DETECTED
Opiates: POSITIVE — AB
Tetrahydrocannabinol: NOT DETECTED

## 2016-04-20 LAB — BASIC METABOLIC PANEL WITH GFR
Anion gap: 6 (ref 5–15)
BUN: 15 mg/dL (ref 6–20)
CO2: 26 mmol/L (ref 22–32)
Calcium: 8.9 mg/dL (ref 8.9–10.3)
Chloride: 107 mmol/L (ref 101–111)
Creatinine, Ser: 0.88 mg/dL (ref 0.44–1.00)
GFR calc Af Amer: >60 mL/min
GFR calc non Af Amer: >60 mL/min
Glucose, Bld: 79 mg/dL (ref 65–99)
Potassium: 4 mmol/L (ref 3.5–5.1)
Sodium: 139 mmol/L (ref 135–145)

## 2016-04-20 LAB — URINALYSIS, ROUTINE W REFLEX MICROSCOPIC
Bilirubin Urine: NEGATIVE
Glucose, UA: NEGATIVE mg/dL
Hgb urine dipstick: NEGATIVE
Ketones, ur: NEGATIVE mg/dL
Leukocytes, UA: NEGATIVE
Nitrite: NEGATIVE
Protein, ur: NEGATIVE mg/dL
Specific Gravity, Urine: 1.016 (ref 1.005–1.030)
pH: 5.5 (ref 5.0–8.0)

## 2016-04-20 LAB — SALICYLATE LEVEL: Salicylate Lvl: <4 mg/dL (ref 2.8–30.0)

## 2016-04-20 LAB — ETHANOL: Alcohol, Ethyl (B): <5 mg/dL

## 2016-04-20 LAB — ACETAMINOPHEN LEVEL: Acetaminophen (Tylenol), Serum: <10 ug/mL — ABNORMAL LOW (ref 10–30)

## 2016-04-20 LAB — AMMONIA: Ammonia: 17 umol/L (ref 9–35)

## 2016-04-20 NOTE — Discharge Instructions (Signed)
Your xray, labs and CT scan today were unremarkable. Please only take your home pain medications as prescribed. Do not take more than you are prescribed. Follow up with your primary care provider for re-evaluation as well as your neurologist. Return to the ED if you experience severe worsening of your symptoms, chest pain, loss of consciousness, confusion, slurred speech or weakness.

## 2016-04-20 NOTE — ED Notes (Signed)
Pt verbalized understanding of d/c instructions and has no further questions. Pt stable and NAD. Pt d/c home with friend driving. Pt alert and oriented x 4, independently ambulatory and NAD. VSS

## 2016-04-20 NOTE — ED Provider Notes (Signed)
[facility] DEPT Provider Note   CSN: [id number] Arrival date & time: 04/20/16  1520  First Provider Contact:  None       History   Chief Complaint Chief Complaint  Patient presents with  . Fatigue    HPI Deborah Cooke is a 65 y.o. female with a pmhx of HA, anxiety who presents to the ED today sent by eye doctor for lethargy. Pt States that she was getting an eye exam today when the doctor recognized that she kept falling asleep during the exam. He became concerned that she was not safe to drive home so they sent her to the ED for further evaluation. Patient states that she took a Xanax and an oxycodone this morning which she takes daily for her chronic pain. Patient also states that she has chronic left-sided headaches which she is followed by with neurology. She is scheduled to have an outpatient MRI performed next month. She denies any alcohol or other recreational drug use. She denies any blurry vision, vomiting, weakness, paresthesias, dizziness or loss of consciousness.  HPI  Past Medical History:  Diagnosis Date  . Adverse effect of anesthetic 01/2012   difficulty waking up.  . Adverse effect of unspecified anesthetic, initial encounter 08/2011   low o2 sats.  . Anxiety   . Closed supracondylar fracture of right femur ([facility]) 09/18/2012  . Headache(784.0)   . Hypertension   . Osteoporosis   . Osteoporosis with fracture 09/19/2012  . Thyroid disease     Patient Active Problem List   Diagnosis Date Noted  . Hypertension   . Thyroid disease   . Osteoporosis   . Anxiety   . Acute blood loss anemia 09/19/2012  . Chronic pain 09/19/2012  . Osteoporosis with fracture 09/19/2012  . Closed supracondylar fracture of right femur ([facility] [place]) 09/18/2012    Past Surgical History:  Procedure Laterality Date  . CHOLECYSTECTOMY  10/1975  . FEMUR IM NAIL  09/18/2012   Procedure: INTRAMEDULLARY (IM) RETROGRADE FEMORAL NAILING;  Surgeon: [name], MD;  Location: [facility];  Service:  Orthopedics;  Laterality: Right;  . GASTRIC BYPASS  2003  . JOINT REPLACEMENT    . REPLACEMENT TOTAL KNEE  2003   right and left -4 months aoart.  . TONSILLECTOMY    . WRIST FRACTURE SURGERY  08/2011    OB History    No data available       Home Medications    Prior to Admission medications   Medication Sig Start Date End Date Taking? Authorizing Provider  ALPRAZolam Duanne Moron) 1 MG tablet Take 1 mg by mouth 4 (four) times daily as needed. For anxiety    Historical Provider, MD  B Complex Vitamins (B COMPLEX 100 PO) Take 1 tablet by mouth daily.    Historical Provider, MD  Biotin 1000 MCG tablet Take 1,000 mcg by mouth 2 (two) times daily.    Historical Provider, MD  buPROPion (WELLBUTRIN SR) 200 MG 12 hr tablet Take 200 mg by mouth 2 (two) times daily.    Historical Provider, MD  Calcium Carbonate (CALCIUM 600 PO) Take 1 tablet by mouth 3 (three) times daily.    Historical Provider, MD  cyclobenzaprine (FLEXERIL) 10 MG tablet Take 10 mg by mouth 3 (three) times daily as needed. For muscle spasms    Historical Provider, MD  enoxaparin (LOVENOX) 40 MG/0.4ML injection Inject 0.4 mLs (40 mg total) into the skin daily. 09/18/12   Marchia Bond, MD  furosemide (LASIX) 20 MG tablet  Take 20 mg by mouth daily.    Historical Provider, MD  gabapentin (NEURONTIN) 800 MG tablet Take 800 mg by mouth 2 (two) times daily.    Historical Provider, MD  HYDROmorphone (DILAUDID) 2 MG tablet Take 1 tablet (2 mg total) by mouth every 4 (four) hours as needed for pain. 09/18/12   Marchia Bond, MD  levothyroxine (SYNTHROID, LEVOTHROID) 50 MCG tablet Take 50 mcg by mouth daily.    Historical Provider, MD  liothyronine (CYTOMEL) 25 MCG tablet Take 25 mcg by mouth daily.    Historical Provider, MD  LORazepam (ATIVAN) 2 MG tablet Take 4 mg by mouth at bedtime.    Historical Provider, MD  Multiple Vitamins-Iron (MULTIVITAMIN/IRON PO) Take 1 tablet by mouth 2 (two) times daily.    Historical Provider, MD    NORTRIPTYLINE HCL PO Take by mouth.    Historical Provider, MD  oxycodone (ROXICODONE) 30 MG immediate release tablet Take 30 mg by mouth every 4 (four) hours as needed. For pain. Do not exceed 5 tabs in a day.    Historical Provider, MD  oxyCODONE-acetaminophen (PERCOCET) 10-325 MG per tablet Take 1-2 tablets by mouth every 6 (six) hours as needed for pain. MAXIMUM TOTAL ACETAMINOPHEN DOSE IS 4000 MG PER DAY 09/18/12   Marchia Bond, MD  potassium chloride (K-DUR,KLOR-CON) 10 MEQ tablet Take 10 mEq by mouth daily.    Historical Provider, MD  QUEtiapine (SEROQUEL XR) 300 MG 24 hr tablet Take 300 mg by mouth daily.    Historical Provider, MD  ranitidine (ZANTAC) 300 MG tablet Take 300 mg by mouth daily.    Historical Provider, MD  sennosides-docusate sodium (SENOKOT-S) 8.6-50 MG tablet Take 1 tablet by mouth daily. 09/18/12   Marchia Bond, MD  tiZANidine (ZANAFLEX) 4 MG tablet Take 4-8 mg by mouth at bedtime.    Historical Provider, MD  Vitamin D, Ergocalciferol, (DRISDOL) 50000 UNITS CAPS Take 50,000 Units by mouth every 7 (seven) days. Does not take on any specific day of the week    Historical Provider, MD    Family History History reviewed. No pertinent family history.  Social History Social History  Substance Use Topics  . Smoking status: Former Smoker    Quit date: 01/17/1989  . Smokeless tobacco: Former Systems developer    Quit date: 01/17/1989  . Alcohol use Yes     Comment: rarely     Allergies   Nsaids   Review of Systems Review of Systems  All other systems reviewed and are negative.    Physical Exam Updated Vital Signs BP 131/81 (BP Location: Right Arm)   Pulse 80   Resp 20   Ht 5' (1.524 m)   Wt 88.5 kg   SpO2 98%   BMI 38.08 kg/m   Physical Exam  Constitutional: She is oriented to person, place, and time. She appears well-developed. No distress.  HENT:  Head: Normocephalic and atraumatic.  Mouth/Throat: No oropharyngeal exudate.  Eyes: Conjunctivae and EOM are  normal. Pupils are equal, round, and reactive to light. Right eye exhibits no discharge. Left eye exhibits no discharge. No scleral icterus.  Cardiovascular: Normal rate, regular rhythm, normal heart sounds and intact distal pulses.  Exam reveals no gallop and no friction rub.   No murmur heard. Pulmonary/Chest: Effort normal and breath sounds normal. No respiratory distress. She has no wheezes. She has no rales. She exhibits no tenderness.  Abdominal: Soft. She exhibits no distension. There is no tenderness. There is no guarding.  Musculoskeletal: Normal range  of motion. She exhibits no edema.  Neurological: She is alert and oriented to person, place, and time.  Pt appears drowsy, closing her eyes during exam. Able to follow commands. No facial droop. No slurred speech. No pronator drift. Strength 5 out of 5 throughout. No sensory deficits. Gait abnormality.  Skin: Skin is warm and dry. No rash noted. She is not diaphoretic. No erythema. No pallor.  Nursing note and vitals reviewed.    ED Treatments / Results  Labs (all labs ordered are listed, but only abnormal results are displayed) Labs Reviewed  ACETAMINOPHEN LEVEL - Abnormal; Notable for the following:       Result Value   Acetaminophen (Tylenol), Serum <10 (*)    All other components within normal limits  CBC WITH DIFFERENTIAL/PLATELET - Abnormal; Notable for the following:    Hemoglobin 10.6 (*)    HCT 33.6 (*)    All other components within normal limits  URINE RAPID DRUG SCREEN, HOSP PERFORMED - Abnormal; Notable for the following:    Opiates POSITIVE (*)    Benzodiazepines POSITIVE (*)    All other components within normal limits  BASIC METABOLIC PANEL  ETHANOL  SALICYLATE LEVEL  URINALYSIS, ROUTINE W REFLEX MICROSCOPIC (NOT AT [facility])  AMMONIA    EKG  EKG Interpretation None       Radiology No results found.  Procedures Procedures (including critical care time)  Medications Ordered in ED Medications - No  data to display   Initial Impression / Assessment and Plan / ED Course  I have reviewed the triage vital signs and the nursing notes.  Pertinent labs & imaging results that were available during my care of the patient were reviewed by me and considered in my medical decision making (see chart for details).  Clinical Course   65 year old female presents to the ED sent by her eye doctor for appearing more drowsy and lethargic than normal. Patient was falling asleep during exam. Presentation to the ED today patient is alert and oriented but does appear drowsy on exam. She is constantly closing her eyes. She is able to follow commands and answer questions appropriately however. Patient takes Xanax and oxycodone for chronic back pain. Feel that patient likely overmedicated which is causing her drowsiness. However, need to rule out other causes. Tox screen reveals positive for opiates and benzos. All other lab work appears to within normal limits. CT head and chest x-ray are also unremarkable. Patient is scheduled to have an outpatient MRI of her brain done later this month for chronic left-sided headaches. This was ordered by her neurologist. Upon reevaluation the patient does appear more alert and she is ambulated in the ED without difficulty and requesting to go home. Given normal lab work and clinical improvement feel that she is safe for discharge with PCP and neurology follow-up.   F is inal Clinical Impressions(s) / ED Diagnoses   Final diagnoses:  Lethargy    New Prescriptions New Prescriptions   No medications on file     Carlos Levering, PA-C 04/20/16 2312    Pattricia Boss, MD 04/24/16 [phone]

## 2016-04-20 NOTE — ED Notes (Signed)
After further examination with pt by this RN, pt has chronic migraines that she takes pains for.   Pt states "I took 1 xanax, 1 ms contin, and 1 oxycodone this morning which I take every day"   Pt states "The PA sent me here because she didn't think I was safe to drive home on my own" Pt is lethargic here but arousable to voice. Neuro exam is normal, no deficits. Pt also complains of left sided headache that she states "I have all the time" Pt alert and oriented x 4.

## 2016-04-20 NOTE — ED Notes (Signed)
Pt transported to CT ?

## 2016-04-20 NOTE — ED Triage Notes (Signed)
Pt sent from eye dr for lethargy. Pt was sitting in waiting room and kept falling asleep. Pt states that she kept falling asleep because she woke up earlier than normal this morning because she has a new puppy in the house and she is training causing her to lose sleep. The PA at the eye dr evaluated the pt and deemed it necessary to send the pt to the ED. Pt was not happy about transport but agreed. Pt ambulatory here. Pt was being seen by eye Dr for a postop appointment. EMS VS 130 palpated BP, HR 84, CBG 79, RR 15. Pt alert and oriented x 4.

## 2017-02-16 ENCOUNTER — Other Ambulatory Visit: Payer: Self-pay | Admitting: Radiology

## 2017-02-16 DIAGNOSIS — C50919 Malignant neoplasm of unspecified site of unspecified female breast: Secondary | ICD-10-CM

## 2017-02-16 HISTORY — DX: Malignant neoplasm of unspecified site of unspecified female breast: C50.919

## 2017-02-16 HISTORY — PX: BREAST BIOPSY: SHX20

## 2017-02-25 ENCOUNTER — Other Ambulatory Visit: Payer: Self-pay | Admitting: Surgery

## 2017-02-25 DIAGNOSIS — Z170 Estrogen receptor positive status [ER+]: Principal | ICD-10-CM

## 2017-02-25 DIAGNOSIS — C50912 Malignant neoplasm of unspecified site of left female breast: Secondary | ICD-10-CM

## 2017-03-02 ENCOUNTER — Encounter: Payer: Self-pay | Admitting: Radiation Oncology

## 2017-03-08 ENCOUNTER — Ambulatory Visit: Payer: Medicare Other | Attending: Surgery | Admitting: Physical Therapy

## 2017-03-08 ENCOUNTER — Encounter (HOSPITAL_BASED_OUTPATIENT_CLINIC_OR_DEPARTMENT_OTHER): Payer: Self-pay | Admitting: *Deleted

## 2017-03-08 DIAGNOSIS — M6281 Muscle weakness (generalized): Secondary | ICD-10-CM | POA: Diagnosis present

## 2017-03-08 DIAGNOSIS — R293 Abnormal posture: Secondary | ICD-10-CM

## 2017-03-08 DIAGNOSIS — M25512 Pain in left shoulder: Secondary | ICD-10-CM | POA: Diagnosis present

## 2017-03-08 DIAGNOSIS — M25612 Stiffness of left shoulder, not elsewhere classified: Secondary | ICD-10-CM | POA: Insufficient documentation

## 2017-03-08 DIAGNOSIS — G8929 Other chronic pain: Secondary | ICD-10-CM | POA: Insufficient documentation

## 2017-03-08 NOTE — Patient Instructions (Addendum)
Flexion (Eccentric) - Active-Assist (Cane)          Cancer Rehab [phone]    Use unaffected arm to push affected arm forward. Avoid hiking shoulder (shoulder should NOT touch cheek). Keep palm relaxed. Slowly lower affected arm. Hold stretch for _5_ seconds repeating _5-10_ times, _1-2_ times a day.  Abduction (Eccentric) - Active-Assist (Cane)    Use unaffected arm to push affected arm out to side. Avoid hiking shoulder (shoulder should NOT touch cheek). Keep palm relaxed. Slowly lower affected arm. Hold stretch _5_ seconds repeating _5-10_ times, _1-2_ times a day.  Cane Exercise: Extension   Stand holding cane behind back with both hands palm-up. Lift the cane away from body until gentle stretch felt. Do NOT lean forward.  Hold __5__ seconds. Repeat _5-10___ times. Do _1-2___ sessions per day.  CHEST: Doorway, Bilateral - Standing    Standing in doorway, place hands on wall with elbows bent at shoulder height and place one foot in front of other. Shift weight onto front foot. Hold _10-20__ seconds. Do _3-5_ times, _1-2_ times a day.   First of all, check with your insurance company to see if provider is in [facility]                                            [address]  [place], [place] [address] [phone]    Does not file for insurance--- call for appointment with [facility]   (for wigs and compression sleeves / gloves/gauntlets )  [place], [place] [address] [phone]  Will file some insurances --- call for appointment   Second to [facility] (for mastectomy prosthetics and garments) [place], [place] [address] [phone] Will file some insurances --- call for appointment  [facility]  [address]  [place], [place] [address] [phone] Lower extremity garments  [facility] [address] [place], [place]  [address] [place] Sales rep:  [employer] [place]:  [phone] [email or web address] Biocompression pumps   [employer]  Sales rep: Donneta Romberg:  [phone] [email or web address] [place] Pulley and Flexitouch pumps    Other Resources: National Lymphedema Network:  www.lymphnet.org [email or web address] for patient articles and purchase a self manual lymph drainage DVD [email or web address] has informative articles.

## 2017-03-08 NOTE — Progress Notes (Addendum)
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 [name] Overall, MD= Left Breast lumpectomy with radioactive seed and sentinel node bx  Past/Anticipated interventions by medical oncology, if any: Chemotherapy   Lymphedema issues, if any:   NO  Pain issues, if any:  Chronic pain back neck  SAFETY ISSUES:  Prior radiation? NO  Pacemaker/ICD?  NO  Is the patient on methotrexate? NO  Current Complaints / other details:Married, 2 children, , disability,   Anxiety; Left breast cyst removal 1988, back surgery,titanium to neck x3 1990x 2, 2000 x thyroid replacement Moher died of breast caner in her 28's,  BP 121/80   Pulse 66   Temp 98.7 F (37.1 C) (Oral)   Resp 18   Ht 5' (1.524 m)   Wt 189 lb 3.2 oz (85.8 kg)   BMI 36.95 kg/m   Wt Readings from Last 3 Encounters:  03/10/17 189 lb 3.2 oz (85.8 kg)  04/20/16 195 lb (88.5 kg)  03/22/16 197 lb (89.4 kg)      Rebecca Eaton, RN 03/08/2017,1:23 PM
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
## 2017-03-09 ENCOUNTER — Encounter: Payer: Self-pay | Admitting: Radiation Oncology

## 2017-03-09 ENCOUNTER — Telehealth: Payer: Self-pay | Admitting: *Deleted

## 2017-03-09 NOTE — Telephone Encounter (Signed)
error 

## 2017-03-10 ENCOUNTER — Ambulatory Visit
Admission: RE | Admit: 2017-03-10 | Discharge: 2017-03-10 | Disposition: A | Discharge status: Home / Self Care | Payer: Medicare Other | Source: Ambulatory Visit | Attending: Radiation Oncology | Admitting: Radiation Oncology

## 2017-03-10 ENCOUNTER — Encounter: Payer: Self-pay | Admitting: Radiation Oncology

## 2017-03-10 VITALS — BP 121/80 | HR 66 | Temp 98.7°F | Resp 18 | Ht 60.0 in | Wt 189.2 lb

## 2017-03-10 DIAGNOSIS — C50412 Malignant neoplasm of upper-outer quadrant of left female breast: Secondary | ICD-10-CM

## 2017-03-10 DIAGNOSIS — E039 Hypothyroidism, unspecified: Secondary | ICD-10-CM | POA: Diagnosis not present

## 2017-03-10 DIAGNOSIS — K219 Gastro-esophageal reflux disease without esophagitis: Secondary | ICD-10-CM | POA: Diagnosis not present

## 2017-03-10 DIAGNOSIS — Z87891 Personal history of nicotine dependence: Secondary | ICD-10-CM | POA: Diagnosis not present

## 2017-03-10 DIAGNOSIS — G8929 Other chronic pain: Secondary | ICD-10-CM | POA: Insufficient documentation

## 2017-03-10 DIAGNOSIS — F329 Major depressive disorder, single episode, unspecified: Secondary | ICD-10-CM | POA: Diagnosis not present

## 2017-03-10 DIAGNOSIS — Z170 Estrogen receptor positive status [ER+]: Secondary | ICD-10-CM | POA: Diagnosis not present

## 2017-03-10 DIAGNOSIS — M542 Cervicalgia: Secondary | ICD-10-CM

## 2017-03-10 DIAGNOSIS — F419 Anxiety disorder, unspecified: Secondary | ICD-10-CM | POA: Insufficient documentation

## 2017-03-10 DIAGNOSIS — Z9884 Bariatric surgery status: Secondary | ICD-10-CM | POA: Diagnosis not present

## 2017-03-10 HISTORY — DX: Malignant neoplasm of unspecified site of unspecified female breast: C50.919

## 2017-03-10 NOTE — Progress Notes (Signed)
Please see the Nurse Progress Note in the MD Initial Consult Encounter for this patient. 

## 2017-03-10 NOTE — Progress Notes (Signed)
Radiation Oncology         [phone] ________________________________  Name: Deborah Cooke MRN: [id number]  Date: 03/10/2017  DOB: 30-Aug-1951  CC:Deborah Fuchs, PA  [name] Overall, MD     REFERRING PHYSICIAN: [name] Overall, MD   DIAGNOSIS: The encounter diagnosis was Malignant neoplasm of upper-outer quadrant of left breast in female, estrogen receptor positive ([place]).   HISTORY OF PRESENT ILLNESS: Deborah Cooke is a 66 y.o. female seen at the request of Dr. Lucia Gaskins for a new diagnosis of left breast cancer. She was found to have a round mass on mammogram on 02/08/17. There was concern as well for a node in the left axilla. She underwent diagnostic ultrasound on  02/10/17 which revealed a 6 mm mass at 3:00 in the outer mid breast on the left. There was some cortical thickening of a node in the left axilla but not suspicious per radiology. A biopsy of the left breast on 02/16/2017 revealed a grade 1, invasive ductal carcinoma (ER:90%, postive; PR: 80%, positive; Ki67: 3%, HER2 negative). She is planning to meet with medical oncology but does not yet have an appointment. She is scheduled for a left breast lumpectomy with radioactive seed and sentinel node bx on 03/15/2017, and comes today to discuss the role of radiotherapy.  PREVIOUS RADIATION THERAPY: No   PAST MEDICAL HISTORY:  Past Medical History:  Diagnosis Date  . Adverse effect of anesthetic 01/2012   difficulty waking up.  . Adverse effect of unspecified anesthetic, initial encounter 08/2011   low o2 sats.  . Anemia   . Anxiety   . Arthritis   . Breast cancer ([place]) 02/16/2017   left breast  . Cancer ([place]) 02/2017   left breast cancer  . Chronic pain   . Closed supracondylar fracture of right femur ([place]) 09/18/2012  . Depression   . GERD (gastroesophageal reflux disease)   . Headache(784.0)   . Hypothyroidism   . Opioid dependence in controlled environment ([place])   . Osteoporosis   . Osteoporosis with fracture 09/19/2012  .  Thyroid disease        PAST SURGICAL HISTORY: Past Surgical History:  Procedure Laterality Date  . BREAST BIOPSY Left 02/16/2017  . CHOLECYSTECTOMY  10/1975  . FEMUR IM NAIL  09/18/2012   Procedure: INTRAMEDULLARY (IM) RETROGRADE FEMORAL NAILING;  Surgeon: [name], MD;  Location: [facility];  Service: Orthopedics;  Laterality: Right;  . GASTRIC BYPASS  2003  . JOINT REPLACEMENT    . REPLACEMENT TOTAL KNEE  2003   right and left -4 months aoart.  . TONSILLECTOMY    . WRIST FRACTURE SURGERY  08/2011     FAMILY HISTORY:  Family History  Problem Relation Age of Onset  . Cancer Mother        breast     SOCIAL HISTORY:  reports that she quit smoking about 28 years ago. She quit smokeless tobacco use about 28 years ago. She reports that she drinks alcohol. She reports that she does not use drugs. The patient is married and lives in [place].   ALLERGIES: Nsaids; Ibuprofen; and Tolmetin   MEDICATIONS:  Current Outpatient Prescriptions  Medication Sig Dispense Refill  . B Complex Vitamins (B COMPLEX 100 PO) Take 1 tablet by mouth daily.    . Biotin 1000 MCG tablet Take 1,000 mcg by mouth 2 (two) times daily.    Marland Kitchen buPROPion (WELLBUTRIN SR) 150 MG 12 hr tablet Take 150 mg by mouth 2 (two)  times daily.    . Calcium Carbonate (CALCIUM 600 PO) Take 1 tablet by mouth daily.     . ferrous sulfate 325 (65 FE) MG EC tablet Take 325 mg by mouth 3 (three) times daily with meals.    . gabapentin (NEURONTIN) 800 MG tablet Take 800 mg by mouth 4 (four) times daily.     Marland Kitchen latanoprost (XALATAN) 0.005 % ophthalmic solution Place 1 drop into both eyes daily. At night    . levothyroxine (SYNTHROID, LEVOTHROID) 50 MCG tablet Take 50 mcg by mouth daily.    Marland Kitchen liothyronine (CYTOMEL) 25 MCG tablet Take 25 mcg by mouth daily.    . Multiple Vitamins-Iron (MULTIVITAMIN/IRON PO) Take 1 tablet by mouth daily.     Marland Kitchen oxybutynin (DITROPAN-XL) 10 MG 24 hr tablet Take 10 mg by mouth daily.    Marland Kitchen oxycodone  (OXY-IR) 5 MG capsule Take 20 mg by mouth 4 (four) times daily.    . potassium chloride (K-DUR,KLOR-CON) 10 MEQ tablet Take 10 mEq by mouth daily.    . QUEtiapine (SEROQUEL XR) 300 MG 24 hr tablet Take 300 mg by mouth daily.    . ranitidine (ZANTAC) 300 MG tablet Take 300 mg by mouth daily.    Marland Kitchen tiZANidine (ZANAFLEX) 4 MG tablet Take 4-8 mg by mouth at bedtime.    . traZODone (DESYREL) 100 MG tablet Take 100 mg by mouth at bedtime.    . Vitamin D, Ergocalciferol, (DRISDOL) 50000 UNITS CAPS Take 50,000 Units by mouth every 7 (seven) days. Does not take on any specific day of the week     No current facility-administered medications for this encounter.      REVIEW OF SYSTEMS: On review of systems, the patient reports that she is doing well overall. She is nervous though about her cancer due to her mother's history of metastatic disease. She denies any chest pain, shortness of breath, cough, fevers, chills, night sweats, unintended weight changes. She denies any bowel or bladder disturbances, and denies abdominal pain, nausea or vomiting. She continues to have neck and chronic back pain. She denies any new musculoskeletal or joint aches or pains. A complete review of systems is obtained and is otherwise negative.     PHYSICAL EXAM:  Wt Readings from Last 3 Encounters:  03/10/17 189 lb 3.2 oz (85.8 kg)  04/20/16 195 lb (88.5 kg)  03/22/16 197 lb (89.4 kg)   Temp Readings from Last 3 Encounters:  03/10/17 98.7 F (37.1 C) (Oral)  04/20/16 98.4 F (36.9 C) (Oral)  03/22/16 98.6 F (37 C) (Oral)   BP Readings from Last 3 Encounters:  03/10/17 121/80  04/20/16 140/86  03/22/16 (!) 158/101   Pulse Readings from Last 3 Encounters:  03/10/17 66  04/20/16 80  03/22/16 94   Pain Assessment Pain Score: 6 /10  In general this is a well appearing caucasian female in no acute distress. she is alert and oriented x4 and appropriate throughout the examination. HEENT reveals that the patient is  normocephalic, atraumatic. EOMs are intact. PERRLA. Skin is intact without any evidence of gross lesions. Cardiovascular exam reveals a regular rate and rhythm, no clicks rubs or murmurs are auscultated. Chest is clear to auscultation bilaterally. Lymphatic assessment is performed and does not reveal any adenopathy in the cervical, supraclavicular, axillary, or inguinal chains. Bilateral breast exam does not reveal any palpable masses. Her previous biopsy site is intact without bruising. No nipple bleeding or discharge is noted.Abdomen has active bowel sounds in all  quadrants and is intact. The abdomen is soft, non tender, non distended. Lower extremities are negative for pretibial pitting edema, deep calf tenderness, cyanosis or clubbing.   ECOG = 0  0 - Asymptomatic (Fully active, able to carry on all predisease activities without restriction)  1 - Symptomatic but completely ambulatory (Restricted in physically strenuous activity but ambulatory and able to carry out work of a light or sedentary nature. For example, light housework, office work)  2 - Symptomatic, <50% in bed during the day (Ambulatory and capable of all self care but unable to carry out any work activities. Up and about more than 50% of waking hours)  3 - Symptomatic, >50% in bed, but not bedbound (Capable of only limited self-care, confined to bed or chair 50% or more of waking hours)  4 - Bedbound (Completely disabled. Cannot carry on any self-care. Totally confined to bed or chair)  5 - Death   Eustace Pen MM, Creech RH, Tormey DC, et al. [phone]). "Toxicity and response criteria of the [facility]". [place] Oncol. 5 (6): 649-55    LABORATORY DATA:  Lab Results  Component Value Date   WBC 7.0 04/20/2016   HGB 10.6 (L) 04/20/2016   HCT 33.6 (L) 04/20/2016   MCV 85.3 04/20/2016   PLT 275 04/20/2016   Lab Results  Component Value Date   NA 139 04/20/2016   K 4.0 04/20/2016   CL 107 04/20/2016    CO2 26 04/20/2016   No results found for: ALT, AST, GGT, ALKPHOS, BILITOT    RADIOGRAPHY: No results found.     IMPRESSION/PLAN: 1. Stage IA, cT1bN0Mx grade 1, ER/PR positive invasive ductal carcinoma of the left breast. Dr. Lisbeth Renshaw discusses the pathology findings and reviews the nature of invasive breast disease. Surgical recommendations have been made for breast conservation with lumpectomy with sentinel mapping. This will be performed next Tuesday. Oncotype will not likely be ordered unless her tumor is greater than 10 mm. Provided that chemotherapy is not indicated, the patient's course would then be followed by external radiotherapy to the breast followed by antiestrogen therapy. We discussed the risks, benefits, short, and long term effects of radiotherapy, and the patient is interested in proceeding. Dr. Lisbeth Renshaw discusses the delivery and logistics of radiotherapy, and he would anticipate a course of 4 weeks. We will see her back about 2 weeks after surgery to move forward with the simulation and planning process and anticipate starting radiotherapy about 4 weeks after surgery.  2. Neck pain. The patient will continue management of this with her PCP and surgeon.  The above documentation reflects my direct findings during this shared patient visit. Please see the separate note by Dr. Lisbeth Renshaw on this date for the remainder of the patient's plan of care.    Carola Rhine, PAC    This document serves as a record of services personally performed by Kyung Rudd, MD. It was created on his behalf by Valeta Harms, a trained medical scribe. The creation of this record is based on the scribe's personal observations and the provider's statements to them. This document has been checked and approved by the attending provider.

## 2017-03-11 ENCOUNTER — Encounter: Payer: Self-pay | Admitting: *Deleted

## 2017-03-11 ENCOUNTER — Telehealth: Payer: Self-pay | Admitting: *Deleted

## 2017-03-11 DIAGNOSIS — C50412 Malignant neoplasm of upper-outer quadrant of left female breast: Secondary | ICD-10-CM | POA: Insufficient documentation

## 2017-03-11 DIAGNOSIS — Z170 Estrogen receptor positive status [ER+]: Secondary | ICD-10-CM

## 2017-03-11 NOTE — Progress Notes (Signed)
Deborah Cooke Psychosocial Distress Screening Clinical Social Work  Clinical Social Work was referred by distress screening protocol.  The patient scored a 5 on the Psychosocial Distress Thermometer which indicates moderate distress. Clinical Social Worker reviewed chart and phoned pt to assess for distress and other psychosocial needs. CSW introduced self, explained role of CSW/Pt and Family Support Team, support groups and other resources to assist. Pt is most concerned about finances. CSW reviewed possible resources to assist, but pt appears to be slightly over the limit. Pt plans to review [name] and her finances to confirm. Pt reports she has struggled with depression and anxiety for many years. She has been on medications to assist for many years and feels these are working well currently. CSW reviewed emotions related to cancer diagnosis and coping techniques as well. Pt agrees to reach out for additional resource/support options as needed. CSW mailing pt Support Team info.    ONCBCN DISTRESS SCREENING 03/10/2017  Screening Type Initial Screening  Distress experienced in past week (1-10) 5  Practical problem type Insurance  Emotional problem type Depression;Nervousness/Anxiety;Adjusting to illness  Physical Problem type Pain;Tingling hands/feet  Physician notified of physical symptoms Yes  Referral to clinical social work Yes    Clinical Social Worker follow up needed: Yes.    If yes, follow up plan:  See above Loren Racer, LCSW, OSW-C Clinical Social Worker [place]  [facility] Phone: [phone] Fax: [phone]

## 2017-03-11 NOTE — Telephone Encounter (Signed)
CALLED PATIENT TO INFORM OF FU APPT. WITH DR. FENG ON 03-21-17 @ 11 AM, LVM FOR A RETURN CALL

## 2017-03-12 ENCOUNTER — Telehealth: Payer: Self-pay | Admitting: Hematology

## 2017-03-12 NOTE — Telephone Encounter (Signed)
Spoke with patient on 6/21 re 7/2 new patient appointment with Dr. Burr Medico at 11 am to arrive 10:30 am. Demographic and insurance information confirmed.

## 2017-03-14 NOTE — Progress Notes (Signed)
Boost drink given with instructions to complete by [employer], hibiclens soap given with instructions, pt verbalized understanding.
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
## 2017-03-15 ENCOUNTER — Encounter (HOSPITAL_BASED_OUTPATIENT_CLINIC_OR_DEPARTMENT_OTHER)
Admission: RE | Disposition: A | Discharge status: Home / Self Care | Payer: Self-pay | Source: Ambulatory Visit | Attending: Surgery

## 2017-03-15 ENCOUNTER — Ambulatory Visit (HOSPITAL_BASED_OUTPATIENT_CLINIC_OR_DEPARTMENT_OTHER)
Admission: RE | Admit: 2017-03-15 | Discharge: 2017-03-15 | Disposition: A | Discharge status: Home / Self Care | Payer: Medicare Other | Source: Ambulatory Visit | Attending: Surgery | Admitting: Surgery

## 2017-03-15 ENCOUNTER — Ambulatory Visit (HOSPITAL_BASED_OUTPATIENT_CLINIC_OR_DEPARTMENT_OTHER): Payer: Medicare Other | Admitting: Anesthesiology

## 2017-03-15 ENCOUNTER — Ambulatory Visit (HOSPITAL_COMMUNITY)
Admission: RE | Admit: 2017-03-15 | Discharge: 2017-03-15 | Disposition: A | Discharge status: Home / Self Care | Payer: Medicare Other | Source: Ambulatory Visit | Attending: Surgery | Admitting: Surgery

## 2017-03-15 ENCOUNTER — Encounter (HOSPITAL_BASED_OUTPATIENT_CLINIC_OR_DEPARTMENT_OTHER): Payer: Self-pay | Admitting: *Deleted

## 2017-03-15 DIAGNOSIS — F419 Anxiety disorder, unspecified: Secondary | ICD-10-CM | POA: Insufficient documentation

## 2017-03-15 DIAGNOSIS — I10 Essential (primary) hypertension: Secondary | ICD-10-CM | POA: Insufficient documentation

## 2017-03-15 DIAGNOSIS — Z803 Family history of malignant neoplasm of breast: Secondary | ICD-10-CM | POA: Insufficient documentation

## 2017-03-15 DIAGNOSIS — R51 Headache: Secondary | ICD-10-CM | POA: Diagnosis not present

## 2017-03-15 DIAGNOSIS — Z79899 Other long term (current) drug therapy: Secondary | ICD-10-CM | POA: Insufficient documentation

## 2017-03-15 DIAGNOSIS — F329 Major depressive disorder, single episode, unspecified: Secondary | ICD-10-CM | POA: Insufficient documentation

## 2017-03-15 DIAGNOSIS — K219 Gastro-esophageal reflux disease without esophagitis: Secondary | ICD-10-CM | POA: Diagnosis not present

## 2017-03-15 DIAGNOSIS — Z87891 Personal history of nicotine dependence: Secondary | ICD-10-CM | POA: Insufficient documentation

## 2017-03-15 DIAGNOSIS — C50912 Malignant neoplasm of unspecified site of left female breast: Secondary | ICD-10-CM

## 2017-03-15 DIAGNOSIS — Z170 Estrogen receptor positive status [ER+]: Principal | ICD-10-CM

## 2017-03-15 HISTORY — DX: Gastro-esophageal reflux disease without esophagitis: K21.9

## 2017-03-15 HISTORY — DX: Depression, unspecified: F32.A

## 2017-03-15 HISTORY — PX: BREAST LUMPECTOMY WITH RADIOACTIVE SEED AND SENTINEL LYMPH NODE BIOPSY: SHX6550

## 2017-03-15 HISTORY — DX: Opioid dependence, uncomplicated: F11.20

## 2017-03-15 HISTORY — DX: Anemia, unspecified: D64.9

## 2017-03-15 HISTORY — DX: Other chronic pain: G89.29

## 2017-03-15 HISTORY — DX: Hypothyroidism, unspecified: E03.9

## 2017-03-15 HISTORY — DX: Unspecified osteoarthritis, unspecified site: M19.90

## 2017-03-15 HISTORY — DX: Major depressive disorder, single episode, unspecified: F32.9

## 2017-03-15 SURGERY — BREAST LUMPECTOMY WITH RADIOACTIVE SEED AND SENTINEL LYMPH NODE BIOPSY
Anesthesia: General | Site: Breast | Laterality: Left

## 2017-03-15 MED ORDER — MIDAZOLAM HCL 2 MG/2ML IJ SOLN
1.0000 mg | INTRAMUSCULAR | Status: DC | PRN
  Administered 2017-03-15 (×2): 1 mg via INTRAVENOUS
  Administered 2017-03-15: 2 mg via INTRAVENOUS

## 2017-03-15 MED ORDER — CHLORHEXIDINE GLUCONATE CLOTH 2 % EX PADS: 6.0000 | MEDICATED_PAD | Freq: Once | CUTANEOUS | Status: DC

## 2017-03-15 MED ORDER — EPHEDRINE 5 MG/ML INJ
INTRAVENOUS | Status: AC
  Filled 2017-03-15: qty 10

## 2017-03-15 MED ORDER — TECHNETIUM TC 99M SULFUR COLLOID FILTERED
1.0000 | Freq: Once | INTRAVENOUS | Status: AC | PRN
  Administered 2017-03-15: 1 via INTRADERMAL

## 2017-03-15 MED ORDER — FENTANYL CITRATE (PF) 100 MCG/2ML IJ SOLN
INTRAMUSCULAR | Status: AC
  Filled 2017-03-15: qty 2

## 2017-03-15 MED ORDER — SCOPOLAMINE 1 MG/3DAYS TD PT72: 1.0000 | MEDICATED_PATCH | Freq: Once | TRANSDERMAL | Status: DC | PRN

## 2017-03-15 MED ORDER — BUPIVACAINE-EPINEPHRINE (PF) 0.5% -1:200000 IJ SOLN
INTRAMUSCULAR | Status: DC | PRN
  Administered 2017-03-15: 20 mL

## 2017-03-15 MED ORDER — CEFAZOLIN SODIUM-DEXTROSE 2-4 GM/100ML-% IV SOLN
2.0000 g | INTRAVENOUS | Status: AC
  Administered 2017-03-15: 2 g via INTRAVENOUS

## 2017-03-15 MED ORDER — LACTATED RINGERS IV SOLN
INTRAVENOUS | Status: DC
  Administered 2017-03-15: 11:00:00 via INTRAVENOUS

## 2017-03-15 MED ORDER — LIDOCAINE 2% (20 MG/ML) 5 ML SYRINGE
INTRAMUSCULAR | Status: AC
  Filled 2017-03-15: qty 5

## 2017-03-15 MED ORDER — CEFAZOLIN SODIUM-DEXTROSE 2-4 GM/100ML-% IV SOLN
INTRAVENOUS | Status: AC
  Filled 2017-03-15: qty 100

## 2017-03-15 MED ORDER — ACETAMINOPHEN 500 MG PO TABS
1000.0000 mg | ORAL_TABLET | ORAL | Status: AC
  Administered 2017-03-15: 1000 mg via ORAL

## 2017-03-15 MED ORDER — FENTANYL CITRATE (PF) 100 MCG/2ML IJ SOLN
25.0000 ug | INTRAMUSCULAR | Status: DC | PRN
Start: 2017-03-15 — End: 2017-03-15

## 2017-03-15 MED ORDER — ONDANSETRON HCL 4 MG/2ML IJ SOLN
INTRAMUSCULAR | Status: DC | PRN
  Administered 2017-03-15: 4 mg via INTRAVENOUS

## 2017-03-15 MED ORDER — EPHEDRINE SULFATE-NACL 50-0.9 MG/10ML-% IV SOSY
PREFILLED_SYRINGE | INTRAVENOUS | Status: DC | PRN
  Administered 2017-03-15: 15 mg via INTRAVENOUS
  Administered 2017-03-15 (×2): 10 mg via INTRAVENOUS

## 2017-03-15 MED ORDER — MIDAZOLAM HCL 2 MG/2ML IJ SOLN
INTRAMUSCULAR | Status: AC
  Filled 2017-03-15: qty 2

## 2017-03-15 MED ORDER — FENTANYL CITRATE (PF) 100 MCG/2ML IJ SOLN
25.0000 ug | INTRAMUSCULAR | Status: DC | PRN
  Administered 2017-03-15: 50 ug via INTRAVENOUS

## 2017-03-15 MED ORDER — HYDROCODONE-ACETAMINOPHEN 5-325 MG PO TABS: 1.0000 | ORAL_TABLET | Freq: Four times a day (QID) | ORAL | 0 refills | Status: DC | PRN

## 2017-03-15 MED ORDER — ONDANSETRON HCL 4 MG/2ML IJ SOLN
INTRAMUSCULAR | Status: AC
  Filled 2017-03-15: qty 2

## 2017-03-15 MED ORDER — DEXAMETHASONE SODIUM PHOSPHATE 4 MG/ML IJ SOLN
INTRAMUSCULAR | Status: DC | PRN
  Administered 2017-03-15: 10 mg via INTRAVENOUS

## 2017-03-15 MED ORDER — FENTANYL CITRATE (PF) 100 MCG/2ML IJ SOLN
INTRAMUSCULAR | Status: AC
Start: 2017-03-15 — End: ?
  Filled 2017-03-15: qty 2

## 2017-03-15 MED ORDER — GABAPENTIN 300 MG PO CAPS: 300.0000 mg | ORAL_CAPSULE | ORAL | Status: DC

## 2017-03-15 MED ORDER — GABAPENTIN 300 MG PO CAPS
ORAL_CAPSULE | ORAL | Status: AC
  Filled 2017-03-15: qty 1

## 2017-03-15 MED ORDER — FENTANYL CITRATE (PF) 100 MCG/2ML IJ SOLN
50.0000 ug | INTRAMUSCULAR | Status: DC | PRN
  Administered 2017-03-15 (×2): 50 ug via INTRAVENOUS

## 2017-03-15 MED ORDER — FENTANYL CITRATE (PF) 100 MCG/2ML IJ SOLN
INTRAMUSCULAR | Status: DC | PRN
  Administered 2017-03-15 (×2): 50 ug via INTRAVENOUS

## 2017-03-15 MED ORDER — DEXAMETHASONE SODIUM PHOSPHATE 10 MG/ML IJ SOLN
INTRAMUSCULAR | Status: AC
Start: 2017-03-15 — End: ?
  Filled 2017-03-15: qty 1

## 2017-03-15 MED ORDER — PROPOFOL 10 MG/ML IV BOLUS
INTRAVENOUS | Status: AC
  Filled 2017-03-15: qty 40

## 2017-03-15 MED ORDER — PROPOFOL 10 MG/ML IV BOLUS
INTRAVENOUS | Status: DC | PRN
  Administered 2017-03-15: 100 mg via INTRAVENOUS

## 2017-03-15 MED ORDER — ACETAMINOPHEN 500 MG PO TABS
ORAL_TABLET | ORAL | Status: AC
  Filled 2017-03-15: qty 2

## 2017-03-15 MED ORDER — LIDOCAINE 2% (20 MG/ML) 5 ML SYRINGE
INTRAMUSCULAR | Status: DC | PRN
  Administered 2017-03-15: 80 mg via INTRAVENOUS

## 2017-03-15 SURGICAL SUPPLY — 46 items
ADH SKN CLS APL DERMABOND .7 (GAUZE/BANDAGES/DRESSINGS) ×1
BINDER BREAST XLRG (GAUZE/BANDAGES/DRESSINGS) ×2 IMPLANT
BLADE SURG 15 STRL LF DISP TIS (BLADE) ×1 IMPLANT
BLADE SURG 15 STRL SS (BLADE) ×3
CANISTER SUCT 1200ML W/VALVE (MISCELLANEOUS) ×3 IMPLANT
CHLORAPREP W/TINT 26ML (MISCELLANEOUS) ×3 IMPLANT
CLIP TI WIDE RED SMALL 6 (CLIP) ×3 IMPLANT
COVER BACK TABLE 60X90IN (DRAPES) ×3 IMPLANT
COVER MAYO STAND STRL (DRAPES) ×3 IMPLANT
COVER PROBE W GEL 5X96 (DRAPES) ×3 IMPLANT
DERMABOND ADVANCED (GAUZE/BANDAGES/DRESSINGS) ×2
DERMABOND ADVANCED .7 DNX12 (GAUZE/BANDAGES/DRESSINGS) ×1 IMPLANT
DEVICE DUBIN W/COMP PLATE 8390 (MISCELLANEOUS) ×3 IMPLANT
DRAPE LAPAROSCOPIC ABDOMINAL (DRAPES) ×3 IMPLANT
DRAPE UTILITY XL STRL (DRAPES) ×3 IMPLANT
DRSG PAD ABDOMINAL 8X10 ST (GAUZE/BANDAGES/DRESSINGS) IMPLANT
ELECT COATED BLADE 2.86 ST (ELECTRODE) ×3 IMPLANT
ELECT REM PT RETURN 9FT ADLT (ELECTROSURGICAL) ×3
ELECTRODE REM PT RTRN 9FT ADLT (ELECTROSURGICAL) ×1 IMPLANT
GAUZE SPONGE 4X4 12PLY STRL (GAUZE/BANDAGES/DRESSINGS) ×3 IMPLANT
GLOVE BIO SURGEON STRL SZ7 (GLOVE) ×2 IMPLANT
GLOVE BIOGEL PI IND STRL 7.0 (GLOVE) IMPLANT
GLOVE BIOGEL PI INDICATOR 7.0 (GLOVE) ×2
GLOVE SURG SIGNA 7.5 PF LTX (GLOVE) ×6 IMPLANT
GLOVE SURG SS PI 6.5 STRL IVOR (GLOVE) ×2 IMPLANT
GOWN STRL REUS W/ TWL LRG LVL3 (GOWN DISPOSABLE) ×1 IMPLANT
GOWN STRL REUS W/ TWL XL LVL3 (GOWN DISPOSABLE) ×1 IMPLANT
GOWN STRL REUS W/TWL LRG LVL3 (GOWN DISPOSABLE) ×6
GOWN STRL REUS W/TWL XL LVL3 (GOWN DISPOSABLE) ×3
KIT MARKER MARGIN INK (KITS) ×3 IMPLANT
NDL HYPO 25X1 1.5 SAFETY (NEEDLE) ×1 IMPLANT
NEEDLE HYPO 25X1 1.5 SAFETY (NEEDLE) ×3 IMPLANT
NS IRRIG 1000ML POUR BTL (IV SOLUTION) ×3 IMPLANT
PACK BASIN DAY SURGERY FS (CUSTOM PROCEDURE TRAY) ×3 IMPLANT
PENCIL BUTTON HOLSTER BLD 10FT (ELECTRODE) ×3 IMPLANT
SHEET MEDIUM DRAPE 40X70 STRL (DRAPES) ×3 IMPLANT
SLEEVE SCD COMPRESS KNEE MED (MISCELLANEOUS) ×3 IMPLANT
SPONGE LAP 18X18 X RAY DECT (DISPOSABLE) ×3 IMPLANT
SUT MNCRL AB 4-0 PS2 18 (SUTURE) ×3 IMPLANT
SUT VICRYL 3-0 CR8 SH (SUTURE) ×3 IMPLANT
SYR CONTROL 10ML LL (SYRINGE) ×3 IMPLANT
TOWEL OR 17X24 6PK STRL BLUE (TOWEL DISPOSABLE) ×3 IMPLANT
TOWEL OR NON WOVEN STRL DISP B (DISPOSABLE) ×3 IMPLANT
TUBE CONNECTING 20'X1/4 (TUBING) ×1
TUBE CONNECTING 20X1/4 (TUBING) ×2 IMPLANT
YANKAUER SUCT BULB TIP NO VENT (SUCTIONS) ×3 IMPLANT

## 2017-03-15 NOTE — Anesthesia Preprocedure Evaluation (Addendum)
Anesthesia Evaluation  Patient identified by MRN, date of birth, ID band Patient awake    Reviewed: Allergy & Precautions, H&P , NPO status , Patient's Chart, lab work & pertinent test results, reviewed documented beta blocker date and time   History of Anesthesia Complications (+) PROLONGED EMERGENCE and history of anesthetic complications  Airway Mallampati: II  TM Distance: >3 FB Neck ROM: full    Dental no notable dental hx. (+) Dental Advisory Given, Poor Dentition   Pulmonary neg pulmonary ROS, former smoker,    Pulmonary exam normal breath sounds clear to auscultation       Cardiovascular hypertension, On Medications  Rhythm:regular Rate:Normal     Neuro/Psych  Headaches, Anxiety Depression negative psych ROS   GI/Hepatic negative GI ROS, Neg liver ROS, GERD  ,  Endo/Other  negative endocrine ROSMorbid obesity  Renal/GU negative Renal ROS  negative genitourinary   Musculoskeletal   Abdominal (+)  Abdomen: soft. Bowel sounds: normal.  Peds  Hematology negative hematology ROS (+)   Anesthesia Other Findings Easy GA ETT in past  Reproductive/Obstetrics negative OB ROS                           Anesthesia Physical  Anesthesia Plan  ASA: III  Anesthesia Plan: General   Post-op Pain Management:  Regional for Post-op pain   Induction: Intravenous  PONV Risk Score and Plan:   Airway Management Planned: LMA  Additional Equipment:   Intra-op Plan:   Post-operative Plan: Extubation in OR  Informed Consent: I have reviewed the patients History and Physical, chart, labs and discussed the procedure including the risks, benefits and alternatives for the proposed anesthesia with the patient or authorized representative who has indicated his/her understanding and acceptance.   Dental Advisory Given  Plan Discussed with: CRNA and Surgeon  Anesthesia Plan Comments: (  )       Anesthesia Quick Evaluation

## 2017-03-15 NOTE — Anesthesia Procedure Notes (Addendum)
Anesthesia Regional Block: Pectoralis block   Pre-Anesthetic Checklist: ,, timeout performed, Correct Patient, Correct Site, Correct Laterality, Correct Procedure, Correct Position, site marked, Risks and benefits discussed, pre-op evaluation,  At surgeon's request and post-op pain management  Laterality: Left  Prep: chloraprep       Needles:   Needle Type: Echogenic Needle     Needle Length: 9cm  Needle Gauge: 21     Additional Needles:   Procedures: ultrasound guided,,,,,,,,  Narrative:  Start time: 03/15/2017 11:52 AM End time: 03/15/2017 11:58 AM Injection made incrementally with aspirations every 5 mL. Anesthesiologist: Lyndle Herrlich

## 2017-03-15 NOTE — Discharge Instructions (Signed)
[facility] - DISCHARGE INSTRUCTIONS TO PATIENT  Activity:  Driving - May drive in one or two days, if doing well   Lifting - No lifting more than 15 pounds for one week, then no limit  Wound Care:   Leave bandage on for 2 days, then may remove bandage and shower.  Diet:  As tolerated.  Follow up appointment:  Call Dr. Pollie Friar office [facility]) at [phone] for an appointment in 2 - 3 weeks.  Medications and dosages:  Resume your home medications.  You have a prescription for:  Vicodin  Call Dr. Lucia Gaskins or his office  [phone]) if you have:  Temperature greater than 100.4,  Persistent nausea and vomiting,  Severe uncontrolled pain,  Redness, tenderness, or signs of infection (pain, swelling, redness, odor or green/yellow discharge around the site),  Any other questions or concerns you may have after discharge.  In an emergency, call 911 or go to an Emergency Department at a nearby hospital.   Post Anesthesia Home Care Instructions  Activity: Get plenty of rest for the remainder of the day. A responsible individual must stay with you for 24 hours following the procedure.  For the next 24 hours, DO NOT: -Drive a car -Paediatric nurse -Drink alcoholic beverages -Take any medication unless instructed by your physician -Make any legal decisions or sign important papers.  Meals: Start with liquid foods such as gelatin or soup. Progress to regular foods as tolerated. Avoid greasy, spicy, heavy foods. If nausea and/or vomiting occur, drink only clear liquids until the nausea and/or vomiting subsides. Call your physician if vomiting continues.  Special Instructions/Symptoms: Your throat may feel dry or sore from the anesthesia or the breathing tube placed in your throat during surgery. If this causes discomfort, gargle with warm salt water. The discomfort should disappear within 24 hours.  If you had a scopolamine patch placed behind your  ear for the management of post- operative nausea and/or vomiting:  1. The medication in the patch is effective for 72 hours, after which it should be removed.  Wrap patch in a tissue and discard in the trash. Wash hands thoroughly with soap and water. 2. You may remove the patch earlier than 72 hours if you experience unpleasant side effects which may include dry mouth, dizziness or visual disturbances. 3. Avoid touching the patch. Wash your hands with soap and water after contact with the patch.     Regional Anesthesia Blocks  1. Numbness or the inability to move the "blocked" extremity may last from 3-48 hours after placement. The length of time depends on the medication injected and your individual response to the medication. If the numbness is not going away after 48 hours, call your surgeon.  2. The extremity that is blocked will need to be protected until the numbness is gone and the  Strength has returned. Because you cannot feel it, you will need to take extra care to avoid injury. Because it may be weak, you may have difficulty moving it or using it. You may not know what position it is in without looking at it while the block is in effect.  3. For blocks in the legs and feet, returning to weight bearing and walking needs to be done carefully. You will need to wait until the numbness is entirely gone and the strength has returned. You should be able to move your leg and foot normally before you try and bear weight or walk. You will need someone to  be with you when you first try to ensure you do not fall and possibly risk injury.  4. Bruising and tenderness at the needle site are common side effects and will resolve in a few days.  5. Persistent numbness or new problems with movement should be communicated to the surgeon or the [facility] [phone] [place] [phone]).

## 2017-03-15 NOTE — Anesthesia Procedure Notes (Signed)
Procedure Name: LMA Insertion Date/Time: 03/15/2017 1:44 PM Performed by: Catalina Gravel Pre-anesthesia Checklist: Patient identified, Emergency Drugs available, Suction available and Patient being monitored Patient Re-evaluated:Patient Re-evaluated prior to inductionOxygen Delivery Method: Circle system utilized Preoxygenation: Pre-oxygenation with 100% oxygen Intubation Type: IV induction Ventilation: Mask ventilation without difficulty LMA: LMA inserted LMA Size: 4.0 Number of attempts: 1 Airway Equipment and Method: Bite block Placement Confirmation: positive ETCO2 Tube secured with: Tape Dental Injury: Teeth and Oropharynx as per pre-operative assessment

## 2017-03-15 NOTE — Progress Notes (Signed)
Assisted Dr. Kyle Jackson with left, ultrasound guided, pectoralis block. Side rails up, monitors on throughout procedure. See vital signs in flow sheet. Tolerated Procedure well. 

## 2017-03-15 NOTE — Op Note (Signed)
03/15/2017  2:57 PM  PATIENT:  Deborah Cooke DOB: [birth year] MRN: [id number]  PREOP DIAGNOSIS:   LEFT BREAST CANCER  POSTOP DIAGNOSIS:    Left breast cancer, 3 o'clock position (T1, N0)  PROCEDURE:   Procedure(s): LEFT BREAST LUMPECTOMY WITH RADIOACTIVE SEED AND LEFT AXILLARY SENTINEL LYMPH NODE BIOPSY, deep sentinel lymph node biopsy  SURGEON:   [name] Overall, M.D.  ANESTHESIA:   general  Anesthesiologist: Catalina Gravel, MD CRNA: Mechele Claude, CRNA; Willa Frater, CRNA  General  EBL:  minimal  ml  DRAINS:  none   LOCAL MEDICATIONS USED:   20 cc of 1/4% marcaine, left pectoral block by anesthesia  SPECIMEN:   Left breast lumpectomy, left sentinel axillary node biopsy (counts -  200, background - 0)  COUNTS CORRECT:  YES  INDICATIONS FOR PROCEDURE:  Deborah Cooke is a 66 y.o. (DOB: 01/26/1951) white female whose primary care physician is Rich Fuchs, PA and comes for left breast lumpectomy and left axillary sentinel lymph node biopsy.   She has a left breast cancer diagnosed on core biopsy at [facility] on 02/08/2017.  The tumor measured 0.6 cm.  She has seen Dr. Lisbeth Renshaw for rad tx and is to see Dr. Burr Medico for oncology.   The options for breast cancer treatment have been discussed with the patient. She elected to proceed with lumpectomy and axillary sentinel lymph node.     The indications and potential complications of surgery were explained to the patient. Potential complications include, but are not limited to, bleeding, infection, the need for further surgery, and nerve injury.     She had a I131 seed placed on 03/14/2017 in her left breast at [facility].  I confirmed the presence of the I131 seed in the pre op area using the Neoprobe.  The seed is in the 3 o'clock position of the left breast.   In the holding area, her left areola was injected with 1 millicurie of Technitium Sulfur Colloid.  OPERATIVE NOTE:   The patient was taken to room # 8 at [facility] where she  underwent a general anesthesia  supervised by Anesthesiologist: Catalina Gravel, MD CRNA: Mechele Claude, CRNA; Willa Frater, CRNA. Her left breast and axilla were prepped with  ChloraPrep and sterilely draped.    A time-out and the surgical check list was reviewed.    I turned attention to the left breast cancer which was about at the 3 o'clock position of the left breast.   I used the Neoprobe to identify the I131 seed.  I tried to excise an area around the tumor of at least 1 cm.    I excised this block of breast tissue approximately 5 cm by 5 cm  in diameter.   I took the dissection down to the chest wall/pectoralis muscle.  I painted the lumpectomy specimen with the 6 color paint kit and did a specimen mammogram which confirmed the mass, clip, and the seed were all in the right position in the specimen.  The specimen was sent to pathology who called back to confirm that they have the seed.   I then started the left deep axillary sentinel lymph node biopsy. I made an incision in the left axilla.  I found a hot area at the junction of the breast and the pectoralis major muscle, deep in the axilla. I cut down and  identified a hot node that had counts of 200 and the background has 0 counts.  I checked her internal mammary nodes and supraclavicular nodes with the neoprobe and found no other hot area. The axillary node was then sent to pathology.    I then irrigated the wound with saline. I infiltrated approximately 20 mL of 1/4% Marcaine between the incisions.  She had had a left pectoral block by anesthesia prior to procedure. I placed 4 clips to mark biopsy cavity, at 12, 3, 6, and 9 o'clock.  I then closed all the wounds in layers using 3-0 Vicryl sutures for the deep layer. At the skin, I closed the incisions with a 4-0 Monocryl suture. The incisions were then painted with Dermabond.  She had gauze place over the wounds and placed in a breast binder.   The patient tolerated the procedure  well, was transported to the recovery room in good condition. Sponge and needle count were correct at the end of the case.   Final pathology is pending.   [name] Overall, MD, [facility] Pager: [phone] Office phone:  [phone]

## 2017-03-15 NOTE — Transfer of Care (Signed)
Immediate Anesthesia Transfer of Care Note  Patient: Deborah Cooke  Procedure(s) Performed: Procedure(s): LEFT BREAST LUMPECTOMY WITH RADIOACTIVE SEED AND LEFT AXILLARY SENTINEL LYMPH NODE BIOPSY (Left)  Patient Location: PACU  Anesthesia Type:General and GA combined with regional for post-op pain  Level of Consciousness: awake, alert  and oriented  Airway & Oxygen Therapy: Patient Spontanous Breathing and Patient connected to face mask oxygen  Post-op Assessment: Report given to RN and Post -op Vital signs reviewed and stable  Post vital signs: Reviewed and stable  Last Vitals:  Vitals:   03/15/17 1245 03/15/17 1300  BP: 105/71 135/71  Pulse: (!) 30 87  Resp: 20 13  Temp:      Last Pain:  Vitals:   03/15/17 1040  TempSrc: Oral  PainSc: 9          Complications: No apparent anesthesia complications

## 2017-03-15 NOTE — Interval H&P Note (Signed)
History and Physical Interval Note:  03/15/2017 1:12 PM  Deborah Cooke  has presented today for surgery, with the diagnosis of LEFT BREAST CANCER  The various methods of treatment have been discussed with the patient and family. Husband, [name], and [employer], at bedside.  After consideration of risks, benefits and other options for treatment, the patient has consented to  Procedure(s): LEFT BREAST LUMPECTOMY WITH RADIOACTIVE SEED AND LEFT AXILLARY SENTINEL LYMPH NODE BIOPSY (Left) as a surgical intervention .  The patient's history has been reviewed, patient examined, no change in status, stable for surgery.  I have reviewed the patient's chart and labs.  Questions were answered to the patient's satisfaction.     Martese Vanatta H

## 2017-03-15 NOTE — Progress Notes (Signed)
Nuc med staff preformed nuc med inj. Additional sedation given for comfort. Pt tol well. Will call family to bedside and update/provide emotional support

## 2017-03-16 ENCOUNTER — Encounter (HOSPITAL_BASED_OUTPATIENT_CLINIC_OR_DEPARTMENT_OTHER): Payer: Self-pay | Admitting: Surgery

## 2017-03-16 NOTE — Anesthesia Postprocedure Evaluation (Signed)
Anesthesia Post Note  Patient: DELMI FULFER  Procedure(s) Performed: Procedure(s) (LRB): LEFT BREAST LUMPECTOMY WITH RADIOACTIVE SEED AND LEFT AXILLARY SENTINEL LYMPH NODE BIOPSY (Left)     Patient location during evaluation: PACU Anesthesia Type: General Level of consciousness: awake and alert Pain management: pain level controlled Vital Signs Assessment: post-procedure vital signs reviewed and stable Respiratory status: spontaneous breathing, nonlabored ventilation, respiratory function stable and patient connected to nasal cannula oxygen Cardiovascular status: blood pressure returned to baseline and stable Postop Assessment: no signs of nausea or vomiting Anesthetic complications: no    Last Vitals:  Vitals:   03/15/17 1530 03/15/17 1559  BP:  (!) 148/97  Pulse: 80 87  Resp: 10 18  Temp:  36.4 C    Last Pain:  Vitals:   03/15/17 1559  TempSrc:   PainSc: 0-No pain                 Catalina Gravel

## 2017-03-17 NOTE — Addendum Note (Signed)
Addendum  created 03/17/17 1012 by Tawni Millers, CRNA   Charge Capture section accepted

## 2017-03-21 ENCOUNTER — Ambulatory Visit (HOSPITAL_BASED_OUTPATIENT_CLINIC_OR_DEPARTMENT_OTHER): Payer: Medicare Other | Admitting: Hematology

## 2017-03-21 ENCOUNTER — Encounter: Payer: Self-pay | Admitting: Hematology

## 2017-03-21 VITALS — BP 131/70 | HR 69 | Temp 97.7°F | Resp 18 | Ht 60.0 in | Wt 199.3 lb

## 2017-03-21 DIAGNOSIS — C50412 Malignant neoplasm of upper-outer quadrant of left female breast: Secondary | ICD-10-CM | POA: Diagnosis not present

## 2017-03-21 DIAGNOSIS — M810 Age-related osteoporosis without current pathological fracture: Secondary | ICD-10-CM

## 2017-03-21 DIAGNOSIS — G8929 Other chronic pain: Secondary | ICD-10-CM | POA: Diagnosis not present

## 2017-03-21 DIAGNOSIS — F419 Anxiety disorder, unspecified: Secondary | ICD-10-CM | POA: Diagnosis not present

## 2017-03-21 DIAGNOSIS — Z170 Estrogen receptor positive status [ER+]: Secondary | ICD-10-CM

## 2017-03-21 DIAGNOSIS — F329 Major depressive disorder, single episode, unspecified: Secondary | ICD-10-CM | POA: Diagnosis not present

## 2017-03-21 DIAGNOSIS — M8000XD Age-related osteoporosis with current pathological fracture, unspecified site, subsequent encounter for fracture with routine healing: Secondary | ICD-10-CM
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
## 2017-03-22 ENCOUNTER — Encounter: Payer: Self-pay | Admitting: *Deleted

## 2017-03-22 NOTE — Progress Notes (Signed)
Follow Up new Consult Left Breast   Diagnosis6/26/2018: Dr. [name] Overall, MD-+  1. 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 - RESECTION MARGINS ARE NEGATIVE. - BIOPSY SITE. - SCLEROSING ADENOSIS, FIBROCYSTIC CHANGE. - SEE ONCOLOGY TABLE. 2. Lymph node, sentinel, biopsy, Left axillary - ONE OF ONE LYMPH NODES NEGATIVE FOR CARCINOMA (0/1). 3. Lymph node, sentinel, biopsy, Left - ONE OF ONE LYMPH NODES NEGATIVE FOR CARCINOMA (0/1). 4. Lymph node, sentinel, biopsy, Left - ONE OF ONE LYMPH NODES NEGATIVE FOR CARCINOMA (0/1). 5. Lymph node, sentinel, biopsy, Left - ONE OF ONE LYMPH NODES NEGATIVE FOR CARCINOMA (0/1). 6. Lymph node, sentinel, biopsy, Left - ONE OF ONE LYMPH NODES NEGATIVE FOR CARCINOMA (0/1).    Cancer Staging Malignant neoplasm of upper-outer quadrant of left breast in female, estrogen receptor positive ([place]) Staging form: Breast, AJCC 8th Edition - Clinical: cT1b, cN0, cM0, G1, ER: Positive, PR: Positive - Unsigned - Pathologic stage from 03/15/2017: Stage IA (pT1b, pN0, cM0, G1, ER: Positive, PR: Positive, HER2: Negative) - Signed by Truitt Merle, MD on 03/19/2017  referral for genetic testing    Will follow up  With lab after last week of  Radiation   Pain in left axilla and mid breat states 5/10 sharp,incisions well healed BP 128/80 Comment: right arm sitting  Pulse 79   Temp 98 F (36.7 C) (Oral)   Resp 18   Ht '5\' 1"'  (1.549 m)   Wt 198 lb 3.2 oz (89.9 kg)   BMI 37.45 kg/m   Wt Readings from Last 3 Encounters:  03/24/17 198 lb 3.2 oz (89.9 kg)  03/21/17 199 lb 4.8 oz (90.4 kg)  03/15/17 193 lb (87.5 kg)

## 2017-03-24 ENCOUNTER — Ambulatory Visit
Admission: RE | Admit: 2017-03-24 | Discharge: 2017-03-24 | Disposition: A | Discharge status: Home / Self Care | Payer: Medicare Other | Source: Ambulatory Visit | Attending: Radiation Oncology | Admitting: Radiation Oncology

## 2017-03-24 ENCOUNTER — Encounter: Payer: Self-pay | Admitting: Radiation Oncology

## 2017-03-24 VITALS — BP 128/80 | HR 79 | Temp 98.0°F | Resp 18 | Ht 61.0 in | Wt 198.2 lb

## 2017-03-24 DIAGNOSIS — Z170 Estrogen receptor positive status [ER+]: Principal | ICD-10-CM

## 2017-03-24 DIAGNOSIS — C50412 Malignant neoplasm of upper-outer quadrant of left female breast: Secondary | ICD-10-CM

## 2017-03-24 NOTE — Progress Notes (Signed)
Please see the Nurse Progress Note in the MD Initial Consult Encounter for this patient. 

## 2017-03-24 NOTE — Progress Notes (Signed)
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  The patient was originally seen in multidisciplinary clinic. She was felt to be a good candidate for breast conservation treatment. She has completed surgery consisting of a lumpectomy on 03/15/17. Final pathology revealed a pT1bN0 tumor with negative margins and 0/5 nodes positive for malignancy. The patient has done satisfactorily postoperatively. She is appropriate to proceed with adjuvant radiation treatment at this time.  ALLERGIES:  is allergic to nsaids; ibuprofen; and tolmetin.  Meds: Current Outpatient Prescriptions  Medication Sig Dispense Refill  . B Complex Vitamins (B COMPLEX 100 PO) Take 1 tablet by mouth daily.    . Biotin 1000 MCG tablet Take 1,000 mcg by mouth 2 (two) times daily.    Marland Kitchen buPROPion (WELLBUTRIN SR) 150 MG 12 hr tablet Take 150 mg by mouth 2 (two) times daily.    . Calcium Carbonate (CALCIUM 600 PO) Take 1 tablet by mouth daily.     . cholecalciferol (VITAMIN D) 400 units TABS tablet Take 400 Units by mouth daily.    . DULoxetine (CYMBALTA) 60 MG capsule Take 60 mg by mouth daily.    . ferrous sulfate 325 (65 FE) MG EC tablet Take 325 mg by mouth 3 (three) times daily with meals.    . gabapentin (NEURONTIN) 800 MG tablet Take 800 mg by mouth 4 (four) times daily.     . hydrOXYzine (ATARAX/VISTARIL) 25 MG tablet Take 25 mg by mouth daily as needed.   0  . latanoprost (XALATAN) 0.005 % ophthalmic solution Place 1 drop into both eyes daily. At night    . levothyroxine (SYNTHROID, LEVOTHROID) 50 MCG tablet Take 50 mcg by mouth daily.    Marland Kitchen  liothyronine (CYTOMEL) 25 MCG tablet Take 25 mcg by mouth daily.    . Multiple Vitamins-Iron (MULTIVITAMIN/IRON PO) Take 1 tablet by mouth daily.     Marland Kitchen oxybutynin (DITROPAN-XL) 10 MG 24 hr tablet Take 10 mg by mouth daily.    Marland Kitchen oxycodone (OXY-IR) 5 MG capsule Take 15 mg by mouth 4 (four) times daily.     . potassium chloride (K-DUR,KLOR-CON) 10 MEQ tablet Take 10 mEq by mouth daily.    . QUEtiapine (SEROQUEL XR) 300 MG 24 hr tablet Take 300 mg by mouth daily.    . ranitidine (ZANTAC) 300 MG tablet Take 300 mg by mouth daily.    Marland Kitchen tiZANidine (ZANAFLEX) 4 MG tablet Take 4-8 mg by mouth at bedtime.    . traZODone (DESYREL) 100 MG tablet Take 100 mg by mouth at bedtime.    . Vitamin D, Ergocalciferol, (DRISDOL) 50000 UNITS CAPS Take 50,000 Units by mouth every 7 (seven) days. Does not take on any specific day of the week     No current facility-administered medications for this encounter.     Physical Findings: The patient is in no acute distress. Patient is alert and oriented.  height is 5' 1" (1.549 m) and weight is 198 lb 3.2 oz (89.9 kg). Her oral temperature is 98 F (36.7 C). Her blood pressure is 128/80 and her pulse is 79. Her  respiration is 18. .   The patient is s/p lumpectomy. At left breast, the surgical incision at lateral upper outer quadrant is healing well.  Lab Findings: Lab Results  Component Value Date   WBC 7.0 04/20/2016   HGB 10.6 (L) 04/20/2016   HCT 33.6 (L) 04/20/2016   MCV 85.3 04/20/2016   PLT 275 04/20/2016     Radiographic Findings: No results found.  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 Margins were negative at greater than 0.5 cm. The patient will not receive any chemotherapy.  I discussed with the patient the role of  radiation treatment in this setting. We discussed the expected benefit in terms of local/regional control area we also discussed the possible side effects and risks of treatment. All of her questions were answered.  We also discussed the logistics of treatment. The patient wishes to proceed with simulation.  Plan:  The patient will be scheduled for a simulation in the near future such that we can begin treatment planning. I anticipate treating the patient with a 4 week course of radiation treatment. This will correspond to whole breast radiation treatment to the left breast using tangent fields.   ------------------------------------------------   S. , MD, PhD  This document serves as a record of services personally performed by  , MD. It was created on his behalf by Jonathan White, a trained medical scribe. The creation of this record is based on the scribe's personal observations and the provider's statements to them. This document has been checked and approved by the attending provider.    

## 2017-04-04 ENCOUNTER — Encounter: Payer: Self-pay | Admitting: Genetics

## 2017-04-04 ENCOUNTER — Encounter: Payer: Self-pay | Admitting: Hematology and Oncology

## 2017-04-14 ENCOUNTER — Other Ambulatory Visit: Payer: Medicare Other

## 2017-04-14 ENCOUNTER — Encounter: Payer: Self-pay | Admitting: Genetics

## 2017-04-14 ENCOUNTER — Ambulatory Visit
Admission: RE | Admit: 2017-04-14 | Discharge: 2017-04-14 | Disposition: A | Discharge status: Home / Self Care | Payer: Medicare Other | Source: Ambulatory Visit | Attending: Radiation Oncology | Admitting: Radiation Oncology

## 2017-04-14 ENCOUNTER — Ambulatory Visit (HOSPITAL_BASED_OUTPATIENT_CLINIC_OR_DEPARTMENT_OTHER): Payer: Medicare Other | Admitting: Genetics

## 2017-04-14 DIAGNOSIS — Z803 Family history of malignant neoplasm of breast: Secondary | ICD-10-CM | POA: Diagnosis not present

## 2017-04-14 DIAGNOSIS — Z170 Estrogen receptor positive status [ER+]: Principal | ICD-10-CM

## 2017-04-14 DIAGNOSIS — C50412 Malignant neoplasm of upper-outer quadrant of left female breast: Secondary | ICD-10-CM

## 2017-04-14 NOTE — Progress Notes (Signed)
REFERRING PROVIDER: Truitt Merle, MD [place], [place] [address]  PRIMARY PROVIDER:  Rich Fuchs, PA  PRIMARY REASON FOR VISIT:  1. Malignant neoplasm of upper-outer quadrant of left breast in female, estrogen receptor positive ([place])   2. Family history of breast cancer      HISTORY OF PRESENT ILLNESS:   Deborah Cooke, a 66 y.o. female, was seen for a [place] cancer genetics consultation at the request of Dr. Burr Medico due to a personal and family history of cancer as well as her [place]azi [place].  Deborah Cooke presents to clinic today to discuss the possibility of a hereditary predisposition to cancer, genetic testing, and to further clarify her future cancer risks, as well as potential cancer risks for family members.   In 2018, at the age of 80, Deborah Cooke was diagnosed with invasive ductal carcinoma (ER +, PR + HER2-) of the left breast.  She had a lumpectomy on 03/15/2017, and ws starting radiation treatment soon.     CANCER HISTORY:  Oncology History   Cancer Staging Malignant neoplasm of upper-outer quadrant of left breast in female, estrogen receptor positive ([place]) Staging form: Breast, AJCC 8th Edition - Clinical: cT1b, cN0, cM0, G1, ER: Positive, PR: Positive - Unsigned - Pathologic stage from 03/15/2017: Stage IA (pT1b, pN0, cM0, G1, ER: Positive, PR: Positive, HER2: Negative) - Signed by Truitt Merle, MD on 03/19/2017       Malignant neoplasm of upper-outer quadrant of left breast in female, estrogen receptor positive ([place])   02/08/2017 Mammogram    Diagnostic mammogram Screening 02/08/17 IMPRESSION:  Addidiontal Imaging Evaluation needed The new oval mass in the left breast is indeterminate. 3D mediolateral, and spot copression views as well as an ultrasound are recommended      02/10/2017 Mammogram    Mammogram of the left breast 02/10/17 The 6 mm nodule in the left breast is indeterminate. An ultrasound is recommended.        02/10/2017 Imaging    [place]  of Left Breast 02/10/17 The 6 mm lymph node with focal corytex thickening in the left breast is at a low suspicion for malignancy. An Ultrasound guided biopsy is recommended.       02/16/2017 Receptors her2    ER: 90% Positive PR: 80% Positive Ki67: 3% HER2: Negative      02/16/2017 Initial Biopsy    Diagnosis 02/16/17 Breast, left, needle core biopsy - INVASIVE DUCTAL CARCINOMA - SEE COMMENT       03/11/2017 Initial Diagnosis    Malignant neoplasm of upper-outer quadrant of left breast in female, estrogen receptor positive ([place])      03/15/2017 Surgery    LEFT BREAST LUMPECTOMY WITH RADIOACTIVE SEED AND LEFT AXILLARY SENTINEL LYMPH NODE BIOPSY by Dr Lucia Gaskins       03/15/2017 Pathology Results    Diagnosis 03/15/17 1. 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 - RESECTION MARGINS ARE NEGATIVE. - BIOPSY SITE. - SCLEROSING ADENOSIS, FIBROCYSTIC CHANGE. - SEE ONCOLOGY TABLE. 2. Lymph node, sentinel, biopsy, Left axillary - ONE OF ONE LYMPH NODES NEGATIVE FOR CARCINOMA (0/1). 3. Lymph node, sentinel, biopsy, Left - ONE OF ONE LYMPH NODES NEGATIVE FOR CARCINOMA (0/1). 4. Lymph node, sentinel, biopsy, Left - ONE OF ONE LYMPH NODES NEGATIVE FOR CARCINOMA (0/1). 5. Lymph node, sentinel, biopsy, Left - ONE OF ONE LYMPH NODES NEGATIVE FOR CARCINOMA (0/1). 6. Lymph node, sentinel, biopsy, Left - ONE OF  ONE LYMPH NODES NEGATIVE FOR CARCINOMA (0/1).         HORMONAL RISK FACTORS:  Menarche was at age 40.  Ovaries intact: yes.  Hysterectomy: no.  Menopausal status: postmenopausal.  Colonoscopy: yes; 2 polyps found (not precancerous). Mammogram within the last year: yes.  Past Medical History:  Diagnosis Date  . Adverse effect of anesthetic 01/2012   difficulty waking up.  . Adverse effect of unspecified anesthetic, initial encounter 08/2011   low o2 sats.  . Anemia   . Anxiety   . Arthritis   .  Breast cancer ([place]) 02/16/2017   left breast  . Chronic pain   . Closed supracondylar fracture of right femur ([place]) 09/18/2012  . Depression   . Family history of breast cancer   . GERD (gastroesophageal reflux disease)   . Headache(784.0)   . Hypothyroidism   . Opioid dependence in controlled environment ([place])   . Osteoporosis   . Osteoporosis with fracture 09/19/2012  . Thyroid disease     Past Surgical History:  Procedure Laterality Date  . BREAST BIOPSY Left 02/16/2017  . BREAST LUMPECTOMY WITH RADIOACTIVE SEED AND SENTINEL LYMPH NODE BIOPSY Left 03/15/2017   Procedure: LEFT BREAST LUMPECTOMY WITH RADIOACTIVE SEED AND LEFT AXILLARY SENTINEL LYMPH NODE BIOPSY;  Surgeon: [name] Overall, MD;  Location: [facility];  Service: General;  Laterality: Left;  . CHOLECYSTECTOMY  10/1975  . FEMUR IM NAIL  09/18/2012   Procedure: INTRAMEDULLARY (IM) RETROGRADE FEMORAL NAILING;  Surgeon: [name], MD;  Location: [facility];  Service: Orthopedics;  Laterality: Right;  . GASTRIC BYPASS  2003  . JOINT REPLACEMENT    . REPLACEMENT TOTAL KNEE  2003   right and left -4 months aoart.  . TONSILLECTOMY    . WRIST FRACTURE SURGERY  08/2011    Social History   Social History  . Marital status: Married    Spouse name: N/A  . Number of children: N/A  . Years of education: N/A   Social History Main Topics  . Smoking status: Former Smoker    Packs/day: 1.00    Years: 28.00    Quit date: 01/17/1989  . Smokeless tobacco: Former Systems developer    Quit date: 01/17/1989  . Alcohol use Yes     Comment: rarely  . Drug use: No  . Sexual activity: Yes    Birth control/ protection: None   Other Topics Concern  . None   Social History Narrative  . None     FAMILY HISTORY:  We obtained a detailed, 4-generation family history.  Significant diagnoses are listed below: Family History  Problem Relation Age of Onset  . Breast cancer Mother 22       died at 60  . Cancer Father 74        uretherial cancer    Deborah Cooke has a 65 year-old son with no history of cancer.  He has a daughter (56) and a son (67) with no history of cancer.  Her granddaughter has a 65 year-old daughter.  Deborah Cooke also has a 18 year-old son with no children or history of cancer.  Deborah Cooke has a brother who is 66 with no history of cancer.  He has 2 daughters in their 32's.  One of these daughters has a 81 month old son.   Deborah Cooke father was diagnosed with urethral cancer at 33 and died at 19.  He had a sister who died in her 67's due to a heart  attack.  She had no history of cancer or any children.  Deborah Cooke reports that she had several other relatives (aunts, uncles, grandparents, etc) who died during the war.  She does not have any information about them.   Deborah Cooke mother was diagnosed with breast cancer at 31 and died at 56.  She never had her uterus/ovaries removed. Deborah Cooke mother had an aunt who had some type of cancer.  She does not know the details of her diagnosis.  Deborah Cooke has several relatives on this side of the family (aunts/uncles/cousints,/grandparents, etc.)  But these relatives died during the war and she has no information about them.    Deborah Cooke is unaware of previous family history of genetic testing for hereditary cancer risks. Patient's maternal ancestors are of  Ashkenazi Jewish descent, and paternal ancestors are of Ashkenazi Jewish descent. There is no known consanguinity.  GENETIC COUNSELING ASSESSMENT: Deborah Cooke is a 66 y.o. female with a personal and family history which is somewhat suggestive of a Hereditary Cancer Predisposition Syndrome. We, therefore, discussed and recommended the following at today's visit.   DISCUSSION: We reviewed the characteristics, features and inheritance patterns of hereditary cancer syndromes. We also discussed genetic testing, including the appropriate family members to test, the process of testing, insurance coverage and turn-around-time for  results. We discussed the implications of a negative, positive and/or variant of uncertain significant result. We recommended Deborah Cooke pursue genetic testing for the [facility] Common Hereditary Cancers gene panel. The Hereditary Gene Panel offered by [facility] includes sequencing and/or deletion duplication testing of the following 46 genes: APC, ATM, AXIN2, BARD1, BMPR1A, BRCA1, BRCA2, BRIP1, CDH1, CDKN2A (p14ARF), CDKN2A (p16INK4a), CHEK2, CTNNA1, DICER1, EPCAM (Deletion/duplication testing only), GREM1 (promoter region deletion/duplication testing only), KIT, MEN1, MLH1, MSH2, MSH3, MSH6, MUTYH, NBN, NF1, NHTL1, PALB2, PDGFRA, PMS2, POLD1, POLE, PTEN, RAD50, RAD51C, RAD51D, SDHB, SDHC, SDHD, SMAD4, SMARCA4. STK11, TP53, TSC1, TSC2, and VHL.  The following genes were evaluated for sequence changes only: SDHA and HOXB13 c.251G>A variant only.  We discussed that only 5-10% of cancers are associated with a Hereditary cancer predisposition syndrome.  One of the most common hereditary cancer syndromes that increases breast cancer risk is called Hereditary Breast and Ovarian Cancer (HBOC) syndrome.  This syndrome is caused by mutations in the BRCA1 and BRCA2 genes.  This syndrome increases an individual's lifetime risk to develop breast, ovarian, pancreatic, and other types of cancer.  There are also many other cancer predisposition syndromes caused by mutations in several other genes.  There is a higher incidence of mutations in the Ashkenazi Jewish population due to 3 founder mutations.    We discussed that if she is found to have a mutation in one of these genes, it may impact future medical management recommendations such as increased cancer screenings and consideration of risk reducing surgeries.  A positive result could also have implications for the patient's family members.  A Negative result would mean we were unable to identify a hereditary component to her cancer, but does not rule out the possibility of a  hereditary basis for her cancer.  There could be mutations that are undetectable by current technology, or in genes not yet tested or identified to increase cancer risk.    We discussed the potential to find a Variant of Uncertain Significance or VUS.  These are variants that have not yet been identified as pathogenic or benign, and it is unknown if this variant is associated with increased cancer risk  or if this is a normal finding.  Most VUS's are reclassified to benign or likely benign.   It should not be used to make medical management decisions. With time, we suspect the lab will determine the significance of any VUS's identified if any. The lab will be able to identify if she has the [place] mutations or not.    Based on Deborah Cooke personal and family history of cancer, she meets medical criteria for genetic testing. Despite that she meets criteria, she may still have an out of pocket cost. We discussed that if her out of pocket cost for testing is over $100, the laboratory will call and confirm whether she wants to proceed with testing.  If the out of pocket cost of testing is less than $100 she will be billed by the genetic testing laboratory.   PLAN: After considering the risks, benefits, and limitations, Deborah Cooke  provided informed consent to pursue genetic testing and the blood sample was sent to [facility] for analysis of the Common Hereditary Cancer Panel. Results should be available within approximately 2-3 weeks' time, at which point they will be disclosed by telephone to Deborah Cooke, as will any additional recommendations warranted by these results. Deborah Cooke will receive a summary of her genetic counseling visit and a copy of her results once available. This information will also be available in Epic. We encouraged Ms. Pamer to remain in contact with cancer genetics annually so that we can continuously update the family history and inform her of any changes in cancer  genetics and testing that may be of benefit for her family. Ms. Vanepps questions were answered to her satisfaction today. Our contact information was provided should additional questions or concerns arise.  Lastly, we encouraged Ms. Beckom to remain in contact with cancer genetics annually so that we can continuously update the family history and inform her of any changes in cancer genetics and testing that may be of benefit for this family.   Ms.  Rauda questions were answered to her satisfaction today. Our contact information was provided should additional questions or concerns arise. Thank you for the referral and allowing [place] to share in the care of your patient.   Tana Felts, MS Genetic Counselor [email or web address] phone: [phone]  The patient was seen for a total of 40 minutes in face-to-face genetic counseling.  She was accompanied today by her mother in-law.

## 2017-04-16 NOTE — Progress Notes (Signed)
  Radiation Oncology         [phone]) [phone] ________________________________  Name: Deborah Cooke MRN: [id number]  Date: 04/14/2017  DOB: Mar 24, 1951  Optical Surface Tracking Plan:  Since intensity modulated radiotherapy (IMRT) and 3D conformal radiation treatment methods are predicated on accurate and precise positioning for treatment, intrafraction motion monitoring is medically necessary to ensure accurate and safe treatment delivery.  The ability to quantify intrafraction motion without excessive ionizing radiation dose can only be performed with optical surface tracking. Accordingly, surface imaging offers the opportunity to obtain 3D measurements of patient position throughout IMRT and 3D treatments without excessive radiation exposure.  I am ordering optical surface tracking for this patient's upcoming course of radiotherapy. ________________________________  Kyung Rudd, MD 04/16/2017 11:48 PM    Reference:   Ursula Alert, J, et al. Surface imaging-based analysis of intrafraction motion for breast radiotherapy patients.Journal of [place], n. 6, nov. 2014. ISSN [id number].   Available at: <http://www.jacmp.org/index.php/jacmp/article/view/4957>.

## 2017-04-16 NOTE — Progress Notes (Signed)
  Radiation Oncology         (336) [phone] ________________________________  Name: Deborah Cooke MRN: [id number]  Date: 04/14/2017  DOB: 1951/04/12   DIAGNOSIS:     ICD-10-CM   1. Malignant neoplasm of upper-outer quadrant of left breast in female, estrogen receptor positive ([place]) C50.412    Z17.0     SIMULATION AND TREATMENT PLANNING NOTE  The patient presented for simulation prior to beginning her course of radiation treatment for her diagnosis of left-sided breast cancer. The patient was placed in a supine position on a breast board. A customized vac-lock bag was constructed and this complex treatment device will be used on a daily basis during her treatment. In this fashion, a CT scan was obtained through the chest area and an isocenter was placed near the chest wall within the breast.  The patient will be planned to receive a course of radiation initially to a dose of 42.5 Gy. This will consist of a whole breast radiotherapy technique. To accomplish this, 2 customized blocks have been designed which will correspond to medial and lateral whole breast tangent fields. This treatment will be accomplished at 2.5 Gy per fraction. A forward planning technique will also be evaluated to determine if this approach improves the plan. It is anticipated that the patient will then receive a 7.5 Gy boost to the seroma cavity which has been contoured. This will be accomplished at 2.5 Gy per fraction.   This initial treatment will consist of a 3-D conformal technique. The seroma has been contoured as the primary target structure. Additionally, dose volume histograms of both this target as well as the lungs and heart will also be evaluated. Such an approach is necessary to ensure that the target area is adequately covered while the nearby critical  normal structures are adequately spared.  Plan:  The final anticipated total dose therefore will correspond to 50 Gy.   Special treatment procedure was performed  today due to the extra time and effort required by myself to plan and prepare this patient for deep inspiration breath hold technique.  I have determined cardiac sparing to be of benefit to this patient to prevent long term cardiac damage due to radiation of the heart.  Bellows were placed on the patient's abdomen. To facilitate cardiac sparing, the patient was coached by the radiation therapists on breath hold techniques and breathing practice was performed. Practice waveforms were obtained. The patient was then scanned while maintaining breath hold in the treatment position.  This image was then transferred over to the imaging specialist. The imaging specialist then created a fusion of the free breathing and breath hold scans using the chest wall as the stable structure. I personally reviewed the fusion in axial, coronal and sagittal image planes.  Excellent cardiac sparing was obtained.  I felt the patient is an appropriate candidate for breath hold and the patient will be treated as such.  The image fusion was then reviewed with the patient to reinforce the necessity of reproducible breath hold.      _______________________________   Jodelle Gross, MD, PhD

## 2017-04-19 DIAGNOSIS — C50412 Malignant neoplasm of upper-outer quadrant of left female breast: Secondary | ICD-10-CM | POA: Diagnosis not present

## 2017-04-21 ENCOUNTER — Encounter: Payer: Self-pay | Admitting: Genetics

## 2017-04-21 ENCOUNTER — Ambulatory Visit: Payer: Self-pay | Admitting: Genetics

## 2017-04-21 ENCOUNTER — Telehealth: Payer: Self-pay | Admitting: Genetics

## 2017-04-21 ENCOUNTER — Ambulatory Visit
Admission: RE | Admit: 2017-04-21 | Discharge: 2017-04-21 | Disposition: A | Discharge status: Home / Self Care | Payer: Medicare Other | Source: Ambulatory Visit | Attending: Radiation Oncology | Admitting: Radiation Oncology

## 2017-04-21 DIAGNOSIS — C50412 Malignant neoplasm of upper-outer quadrant of left female breast: Secondary | ICD-10-CM | POA: Diagnosis not present

## 2017-04-21 DIAGNOSIS — Z1379 Encounter for other screening for genetic and chromosomal anomalies: Secondary | ICD-10-CM | POA: Insufficient documentation

## 2017-04-21 DIAGNOSIS — Z170 Estrogen receptor positive status [ER+]: Secondary | ICD-10-CM

## 2017-04-21 DIAGNOSIS — Z5181 Encounter for therapeutic drug level monitoring: Secondary | ICD-10-CM | POA: Insufficient documentation

## 2017-04-21 DIAGNOSIS — Z803 Family history of malignant neoplasm of breast: Secondary | ICD-10-CM

## 2017-04-21 NOTE — Telephone Encounter (Signed)
Revealed negative genetic testing.  Discussed that th is result is reassuring, but that we do not know why she has breast cancer or why there is cancer in the family. It could be due to a different gene that we are not testing, or maybe our current technology may not be able to pick something up.  It will be important for her to keep in contact with genetics to keep up with whether additional testing may be needed.

## 2017-04-21 NOTE — Progress Notes (Signed)
HPI: Deborah Cooke was previously seen in the [facility] on 04/14/2017 due to a personal and family history of breast cancer, [place], and concerns regarding a hereditary predisposition to cancer. Please refer to our prior cancer genetics clinic note for more information regarding Deborah Cooke medical, social and family histories, and our assessment and recommendations, at the time. Deborah Cooke recent genetic test results were disclosed to her, as well as recommendations warranted by these results. These results and recommendations are discussed in more detail below.  CANCER HISTORY:  Oncology History   Cancer Staging Malignant neoplasm of upper-outer quadrant of left breast in female, estrogen receptor positive ([place]) Staging form: Breast, AJCC 8th Edition - Clinical: cT1b, cN0, cM0, G1, ER: Positive, PR: Positive - Unsigned - Pathologic stage from 03/15/2017: Stage IA (pT1b, pN0, cM0, G1, ER: Positive, PR: Positive, HER2: Negative) - Signed by Truitt Merle, MD on 03/19/2017       Malignant neoplasm of upper-outer quadrant of left breast in female, estrogen receptor positive ([place])   02/08/2017 Mammogram    Diagnostic mammogram Screening 02/08/17 IMPRESSION:  Addidiontal Imaging Evaluation needed The new oval mass in the left breast is indeterminate. 3D mediolateral, and spot copression views as well as an ultrasound are recommended      02/10/2017 Mammogram    Mammogram of the left breast 02/10/17 The 6 mm nodule in the left breast is indeterminate. An ultrasound is recommended.        02/10/2017 Imaging    [place] of Left Breast 02/10/17 The 6 mm lymph node with focal corytex thickening in the left breast is at a low suspicion for malignancy. An Ultrasound guided biopsy is recommended.       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 - RESECTION MARGINS ARE NEGATIVE. - BIOPSY SITE. - SCLEROSING ADENOSIS, FIBROCYSTIC CHANGE. - SEE ONCOLOGY TABLE. 2. Lymph node, sentinel, biopsy, Left axillary - ONE OF ONE LYMPH NODES NEGATIVE FOR CARCINOMA (0/1). 3. Lymph node, sentinel, biopsy, Left - ONE OF ONE LYMPH NODES NEGATIVE FOR CARCINOMA (0/1). 4. Lymph node, sentinel, biopsy, Left - ONE OF ONE LYMPH NODES NEGATIVE FOR CARCINOMA (0/1). 5. Lymph node, sentinel, biopsy, Left - ONE OF ONE LYMPH NODES NEGATIVE FOR CARCINOMA (0/1). 6. Lymph node, sentinel, biopsy, Left - ONE OF ONE LYMPH NODES NEGATIVE FOR CARCINOMA (0/1).       04/20/2017 Genetic Testing    Patient had genetic testing due to a family history of breast cancer and Ashkenazi Jewish ancestry.  She was tested for the [facility] Common Hereditary Cancers Panel.  The Hereditary Gene Panel offered by [facility] includes sequencing and/or deletion duplication testing of the following 46 genes: APC, ATM, AXIN2, BARD1, BMPR1A, BRCA1, BRCA2, BRIP1, CDH1, CDKN2A (p14ARF), CDKN2A (p16INK4a), CHEK2, CTNNA1, DICER1, EPCAM (Deletion/duplication  testing only), GREM1 (promoter region deletion/duplication testing only), KIT, MEN1, MLH1, MSH2, MSH3, MSH6, MUTYH, NBN, NF1, NHTL1, PALB2, PDGFRA, PMS2, POLD1, POLE, PTEN, RAD50, RAD51C, RAD51D, SDHB, SDHC, SDHD, SMAD4, SMARCA4. STK11, TP53, TSC1, TSC2, and VHL.  The following genes were evaluated for sequence changes only: SDHA and HOXB13 c.251G>A  variant only.  Results: No pathogenic mutations identified in the 46 genes analyzed.   The date of this test report is 04/20/2017.         FAMILY HISTORY:  We obtained a detailed, 4-generation family history.  Significant diagnoses are listed below: Family History  Problem Relation Age of Onset  . Breast cancer Mother 20       died at 81  . Cancer Father 54       uretherial cancer    Ms. Mirelez has a 57 year-old son with no history of cancer.  He has a daughter (18) and a son (55) with no history of cancer.  Her granddaughter has a 77 year-old daughter.  Deborah Cooke also has a 14 year-old son with no children or history of cancer.  Deborah Cooke has a brother who is 92 with no history of cancer.  He has 2 daughters in their 11's.  One of these daughters has a 9 month old son.   Deborah Cooke father was diagnosed with urethral cancer at 47 and died at 67.  He had a sister who died in her 51's due to a heart attack.  She had no history of cancer or any children.  Deborah Cooke reports that she had several other relatives (aunts, uncles, grandparents, etc) who died during the war.  She does not have any information about them.   Deborah Cooke mother was diagnosed with breast cancer at 80 and died at 64.  She never had her uterus/ovaries removed. Deborah Cooke mother had an aunt who had some type of cancer.  She does not know the details of her diagnosis.  Deborah Cooke has several relatives on this side of the family (aunts/uncles/cousints,/grandparents, etc.)  But these relatives died during the war and she has no information about them.    Deborah Cooke is unaware of previous family history of genetic testing for hereditary cancer risks. Patient's maternal ancestors are of  Ashkenazi Jewish descent, and paternal ancestors are of Ashkenazi Jewish descent. There is no known consanguinity.  GENETIC TEST RESULTS: Genetic testing performed through [facility]'s Common Hereditary Cancers Panel reported out on 04/20/2017 showed no  pathogenic mutations. The Hereditary Gene Panel offered by [facility] includes sequencing and/or deletion duplication testing of the following 46 genes: APC, ATM, AXIN2, BARD1, BMPR1A, BRCA1, BRCA2, BRIP1, CDH1, CDKN2A (p14ARF), CDKN2A (p16INK4a), CHEK2, CTNNA1, DICER1, EPCAM (Deletion/duplication testing only), GREM1 (promoter region deletion/duplication testing only), KIT, MEN1, MLH1, MSH2, MSH3, MSH6, MUTYH, NBN, NF1, NHTL1, PALB2, PDGFRA, PMS2, POLD1, POLE, PTEN, RAD50, RAD51C, RAD51D, SDHB, SDHC, SDHD, SMAD4, SMARCA4. STK11, TP53, TSC1, TSC2, and VHL.  The following genes were evaluated for sequence changes only: SDHA and HOXB13 c.251G>A variant only..  The test report will be scanned into EPIC and will be located under the Molecular Pathology section of the Results Review tab.A portion of the result report is included below for reference.       We discussed with Ms. Paiva that because current genetic testing is not perfect, it is possible there may be a gene mutation in one of these genes that current testing cannot detect, but that chance is small. We also discussed, that there could  be another gene that has not yet been discovered, or that we have not yet tested, that is responsible for the cancer diagnoses in the family. Therefore, it is important to remain in touch with cancer genetics in the future so that we can continue to offer Ms. Mohiuddin the most up to date genetic testing.   ADDITIONAL GENETIC TESTING: We discussed with Ms. Hession that there are other genes that are associated with increased cancer risk that can be analyzed. The laboratories that offer this testing look at these additional genes via a hereditary cancer gene panel. Should Ms. Turnbaugh wish to pursue additional genetic testing, we are happy to discuss and coordinate this testing, at any time.    CANCER SCREENING RECOMMENDATIONS: Based on these negative genetic test results, there areno additional cancer risks we are aware of for  Ms. Gallacher, and no additional screening or medical management that we would recommend for her at this time based on genetic testing.    This result is reassuring, and indicates that it is unlikely Ms. Un has an increased risk for a future cancer due to a mutation in one of these genes. This normal test also suggests that Ms. Fodge cancer was most likely not due to an inherited predisposition associated with one of these genes.  Most cancers happen by chance and this negative test suggests that her cancer may fall into this category.  Therefore, it is recommended she continue to follow the cancer management and screening guidelines provided by her oncology and primary healthcare provider. Other factors such as her personal and family history may still affect her cancer risk.   RECOMMENDATIONS FOR FAMILY MEMBERS: Women in this family might be at some increased risk of developing cancer, over the general population risk, simply due to the family history of cancer. We recommended women in this family have a yearly mammogram beginning at age 35, or 24 years younger than the earliest onset of cancer, an annual clinical breast exam, and perform monthly breast self-exams. Women in this family should also have a gynecological exam as recommended by their primary provider. All family members should have a colonoscopy by age 39.  FOLLOW-UP: Lastly, we discussed with Ms. Kohls that cancer genetics is a rapidly advancing field and it is possible that new genetic tests will be appropriate for her and/or her family members in the future. We encouraged her to remain in contact with cancer genetics on an annual basis so we can update her personal and family histories and let her know of advances in cancer genetics that may benefit this family.   Our contact number was provided. Ms. Lonigro questions were answered to her satisfaction, and she knows she is welcome to call us at anytime with additional questions or concerns.    Ferol Luz, MS Genetic Counselor [email or web address]

## 2017-04-25 ENCOUNTER — Ambulatory Visit
Admission: RE | Admit: 2017-04-25 | Discharge: 2017-04-25 | Disposition: A | Discharge status: Home / Self Care | Payer: Medicare Other | Source: Ambulatory Visit | Attending: Radiation Oncology | Admitting: Radiation Oncology

## 2017-04-25 DIAGNOSIS — Z170 Estrogen receptor positive status [ER+]: Principal | ICD-10-CM

## 2017-04-25 DIAGNOSIS — C50412 Malignant neoplasm of upper-outer quadrant of left female breast: Secondary | ICD-10-CM

## 2017-04-25 MED ORDER — RADIAPLEXRX EX GEL
Freq: Once | CUTANEOUS | Status: AC
  Administered 2017-04-25: 10:00:00 via TOPICAL

## 2017-04-25 MED ORDER — ALRA NON-METALLIC DEODORANT (RAD-ONC): 1.0000 "application " | Freq: Once | TOPICAL | Status: DC

## 2017-04-25 NOTE — Progress Notes (Signed)
Pt education , radiation therapy and you book, my business card, alra deodorant, radiaplex gel given, skin irritation,fatigue, pain, side effects, use unscented dove soap, luke warm bath/shower, no rubbing, scrubbing or scratching area of breast, pat dry, increase protein in diet, stay hydrated, exercise, electric shaver if shaving under arm, no under wire bras, radiaplex bid ,right after rad tx and again at bedtime daily  9:58 AM

## 2017-04-26 ENCOUNTER — Ambulatory Visit
Admission: RE | Admit: 2017-04-26 | Discharge: 2017-04-26 | Disposition: A | Discharge status: Home / Self Care | Payer: Medicare Other | Source: Ambulatory Visit | Attending: Radiation Oncology | Admitting: Radiation Oncology

## 2017-04-26 DIAGNOSIS — C50412 Malignant neoplasm of upper-outer quadrant of left female breast: Secondary | ICD-10-CM | POA: Diagnosis not present

## 2017-04-27 ENCOUNTER — Ambulatory Visit
Admission: RE | Admit: 2017-04-27 | Discharge: 2017-04-27 | Disposition: A | Discharge status: Home / Self Care | Payer: Medicare Other | Source: Ambulatory Visit | Attending: Radiation Oncology | Admitting: Radiation Oncology

## 2017-04-27 DIAGNOSIS — C50412 Malignant neoplasm of upper-outer quadrant of left female breast: Secondary | ICD-10-CM | POA: Diagnosis not present

## 2017-04-28 ENCOUNTER — Ambulatory Visit
Admission: RE | Admit: 2017-04-28 | Discharge: 2017-04-28 | Disposition: A | Discharge status: Home / Self Care | Payer: Medicare Other | Source: Ambulatory Visit | Attending: Radiation Oncology | Admitting: Radiation Oncology

## 2017-04-28 DIAGNOSIS — C50412 Malignant neoplasm of upper-outer quadrant of left female breast: Secondary | ICD-10-CM | POA: Diagnosis not present

## 2017-04-29 ENCOUNTER — Ambulatory Visit
Admission: RE | Admit: 2017-04-29 | Discharge: 2017-04-29 | Disposition: A | Discharge status: Home / Self Care | Payer: Medicare Other | Source: Ambulatory Visit | Attending: Radiation Oncology | Admitting: Radiation Oncology

## 2017-04-29 DIAGNOSIS — C50412 Malignant neoplasm of upper-outer quadrant of left female breast: Secondary | ICD-10-CM | POA: Diagnosis not present

## 2017-05-02 ENCOUNTER — Ambulatory Visit
Admission: RE | Admit: 2017-05-02 | Discharge: 2017-05-02 | Disposition: A | Discharge status: Home / Self Care | Payer: Medicare Other | Source: Ambulatory Visit | Attending: Radiation Oncology | Admitting: Radiation Oncology

## 2017-05-02 DIAGNOSIS — C50412 Malignant neoplasm of upper-outer quadrant of left female breast: Secondary | ICD-10-CM | POA: Diagnosis not present

## 2017-05-03 ENCOUNTER — Ambulatory Visit
Admission: RE | Admit: 2017-05-03 | Discharge: 2017-05-03 | Disposition: A | Discharge status: Home / Self Care | Payer: Medicare Other | Source: Ambulatory Visit | Attending: Radiation Oncology | Admitting: Radiation Oncology

## 2017-05-03 DIAGNOSIS — C50412 Malignant neoplasm of upper-outer quadrant of left female breast: Secondary | ICD-10-CM | POA: Diagnosis not present

## 2017-05-04 ENCOUNTER — Ambulatory Visit
Admission: RE | Admit: 2017-05-04 | Discharge: 2017-05-04 | Disposition: A | Discharge status: Home / Self Care | Payer: Medicare Other | Source: Ambulatory Visit | Attending: Radiation Oncology | Admitting: Radiation Oncology

## 2017-05-04 DIAGNOSIS — C50412 Malignant neoplasm of upper-outer quadrant of left female breast: Secondary | ICD-10-CM | POA: Diagnosis not present

## 2017-05-05 ENCOUNTER — Ambulatory Visit
Admission: RE | Admit: 2017-05-05 | Discharge: 2017-05-05 | Disposition: A | Discharge status: Home / Self Care | Payer: Medicare Other | Source: Ambulatory Visit | Attending: Radiation Oncology | Admitting: Radiation Oncology

## 2017-05-05 DIAGNOSIS — C50412 Malignant neoplasm of upper-outer quadrant of left female breast: Secondary | ICD-10-CM | POA: Diagnosis not present

## 2017-05-06 ENCOUNTER — Ambulatory Visit
Admission: RE | Admit: 2017-05-06 | Discharge: 2017-05-06 | Disposition: A | Discharge status: Home / Self Care | Payer: Medicare Other | Source: Ambulatory Visit | Attending: Radiation Oncology | Admitting: Radiation Oncology

## 2017-05-06 DIAGNOSIS — C50412 Malignant neoplasm of upper-outer quadrant of left female breast: Secondary | ICD-10-CM | POA: Diagnosis not present

## 2017-05-09 ENCOUNTER — Ambulatory Visit
Admission: RE | Admit: 2017-05-09 | Discharge: 2017-05-09 | Disposition: A | Discharge status: Home / Self Care | Payer: Medicare Other | Source: Ambulatory Visit | Attending: Radiation Oncology | Admitting: Radiation Oncology

## 2017-05-09 DIAGNOSIS — C50412 Malignant neoplasm of upper-outer quadrant of left female breast: Secondary | ICD-10-CM | POA: Diagnosis not present

## 2017-05-10 ENCOUNTER — Ambulatory Visit
Admission: RE | Admit: 2017-05-10 | Discharge: 2017-05-10 | Disposition: A | Discharge status: Home / Self Care | Payer: Medicare Other | Source: Ambulatory Visit | Attending: Radiation Oncology | Admitting: Radiation Oncology

## 2017-05-10 DIAGNOSIS — C50412 Malignant neoplasm of upper-outer quadrant of left female breast: Secondary | ICD-10-CM | POA: Diagnosis not present

## 2017-05-11 ENCOUNTER — Ambulatory Visit
Admission: RE | Admit: 2017-05-11 | Discharge: 2017-05-11 | Disposition: A | Discharge status: Home / Self Care | Payer: Medicare Other | Source: Ambulatory Visit | Attending: Radiation Oncology | Admitting: Radiation Oncology

## 2017-05-11 ENCOUNTER — Ambulatory Visit: Payer: Medicare Other | Admitting: Radiation Oncology

## 2017-05-11 DIAGNOSIS — C50412 Malignant neoplasm of upper-outer quadrant of left female breast: Secondary | ICD-10-CM | POA: Diagnosis not present

## 2017-05-12 ENCOUNTER — Ambulatory Visit
Admission: RE | Admit: 2017-05-12 | Discharge: 2017-05-12 | Disposition: A | Discharge status: Home / Self Care | Payer: Medicare Other | Source: Ambulatory Visit | Attending: Radiation Oncology | Admitting: Radiation Oncology

## 2017-05-12 DIAGNOSIS — C50412 Malignant neoplasm of upper-outer quadrant of left female breast: Secondary | ICD-10-CM | POA: Diagnosis not present
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
## 2017-05-13 ENCOUNTER — Ambulatory Visit: Payer: Medicare Other | Admitting: Radiation Oncology

## 2017-05-13 ENCOUNTER — Ambulatory Visit
Admission: RE | Admit: 2017-05-13 | Discharge: 2017-05-13 | Disposition: A | Discharge status: Home / Self Care | Payer: Medicare Other | Source: Ambulatory Visit | Attending: Radiation Oncology | Admitting: Radiation Oncology

## 2017-05-13 DIAGNOSIS — C50412 Malignant neoplasm of upper-outer quadrant of left female breast: Secondary | ICD-10-CM | POA: Diagnosis not present

## 2017-05-16 ENCOUNTER — Other Ambulatory Visit (HOSPITAL_BASED_OUTPATIENT_CLINIC_OR_DEPARTMENT_OTHER): Payer: Medicare Other

## 2017-05-16 ENCOUNTER — Encounter (INDEPENDENT_AMBULATORY_CARE_PROVIDER_SITE_OTHER): Payer: Self-pay

## 2017-05-16 ENCOUNTER — Ambulatory Visit
Admission: RE | Admit: 2017-05-16 | Discharge: 2017-05-16 | Disposition: A | Discharge status: Home / Self Care | Payer: Medicare Other | Source: Ambulatory Visit | Attending: Radiation Oncology | Admitting: Radiation Oncology

## 2017-05-16 ENCOUNTER — Ambulatory Visit (HOSPITAL_BASED_OUTPATIENT_CLINIC_OR_DEPARTMENT_OTHER): Payer: Medicare Other | Admitting: Hematology

## 2017-05-16 VITALS — BP 143/83 | HR 81 | Temp 98.3°F | Resp 18 | Ht 61.0 in | Wt 197.5 lb

## 2017-05-16 DIAGNOSIS — G8929 Other chronic pain: Secondary | ICD-10-CM | POA: Diagnosis not present

## 2017-05-16 DIAGNOSIS — F418 Other specified anxiety disorders: Secondary | ICD-10-CM | POA: Diagnosis not present

## 2017-05-16 DIAGNOSIS — M549 Dorsalgia, unspecified: Secondary | ICD-10-CM | POA: Diagnosis not present

## 2017-05-16 DIAGNOSIS — R51 Headache: Secondary | ICD-10-CM

## 2017-05-16 DIAGNOSIS — C50412 Malignant neoplasm of upper-outer quadrant of left female breast: Secondary | ICD-10-CM

## 2017-05-16 DIAGNOSIS — Z170 Estrogen receptor positive status [ER+]: Secondary | ICD-10-CM

## 2017-05-16 DIAGNOSIS — M810 Age-related osteoporosis without current pathological fracture: Secondary | ICD-10-CM

## 2017-05-16 LAB — CBC WITH DIFFERENTIAL/PLATELET
BASO%: 0.3 % (ref 0.0–2.0)
BASOS ABS: 0 10*3/uL (ref 0.0–0.1)
EOS ABS: 0.1 10*3/uL (ref 0.0–0.5)
EOS%: 1.7 % (ref 0.0–7.0)
HCT: 36.6 % (ref 34.8–46.6)
HGB: 12 g/dL (ref 11.6–15.9)
LYMPH#: 1.8 10*3/uL (ref 0.9–3.3)
LYMPH%: 30.4 % (ref 14.0–49.7)
MCH: 28 pg (ref 25.1–34.0)
MCHC: 32.8 g/dL (ref 31.5–36.0)
MCV: 85.5 fL (ref 79.5–101.0)
MONO#: 0.6 10*3/uL (ref 0.1–0.9)
MONO%: 10.2 % (ref 0.0–14.0)
NEUT#: 3.4 10*3/uL (ref 1.5–6.5)
NEUT%: 57.4 % (ref 38.4–76.8)
Platelets: 280 10*3/uL (ref 145–400)
RBC: 4.28 10*6/uL (ref 3.70–5.45)
RDW: 15.5 % — AB (ref 11.2–14.5)
WBC: 6 10*3/uL (ref 3.9–10.3)

## 2017-05-16 LAB — COMPREHENSIVE METABOLIC PANEL
ALT: 25 U/L (ref 0–55)
ANION GAP: 7 meq/L (ref 3–11)
AST: 38 U/L — ABNORMAL HIGH (ref 5–34)
Albumin: 3.8 g/dL (ref 3.5–5.0)
Alkaline Phosphatase: 73 U/L (ref 40–150)
BILIRUBIN TOTAL: 0.35 mg/dL (ref 0.20–1.20)
BUN: 16.2 mg/dL (ref 7.0–26.0)
CHLORIDE: 103 meq/L (ref 98–109)
CO2: 28 meq/L (ref 22–29)
CREATININE: 0.9 mg/dL (ref 0.6–1.1)
Calcium: 9.9 mg/dL (ref 8.4–10.4)
EGFR: 69 mL/min/{1.73_m2} — AB (ref >90)
Glucose: 92 mg/dl (ref 70–140)
Potassium: 4.7 mEq/L (ref 3.5–5.1)
Sodium: 138 mEq/L (ref 136–145)
Total Protein: 7.3 g/dL (ref 6.4–8.3)

## 2017-05-16 MED ORDER — TAMOXIFEN CITRATE 20 MG PO TABS
20.0000 mg | ORAL_TABLET | Freq: Every day | ORAL | 2 refills | Status: DC
Start: 2017-05-16 — End: 2017-07-07

## 2017-05-17 ENCOUNTER — Encounter: Payer: Self-pay | Admitting: Hematology

## 2017-05-17 ENCOUNTER — Ambulatory Visit
Admission: RE | Admit: 2017-05-17 | Discharge: 2017-05-17 | Disposition: A | Discharge status: Home / Self Care | Payer: Medicare Other | Source: Ambulatory Visit | Attending: Radiation Oncology | Admitting: Radiation Oncology

## 2017-05-17 DIAGNOSIS — C50412 Malignant neoplasm of upper-outer quadrant of left female breast: Secondary | ICD-10-CM | POA: Diagnosis not present

## 2017-05-18 ENCOUNTER — Ambulatory Visit
Admission: RE | Admit: 2017-05-18 | Discharge: 2017-05-18 | Disposition: A | Discharge status: Home / Self Care | Payer: Medicare Other | Source: Ambulatory Visit | Attending: Radiation Oncology | Admitting: Radiation Oncology

## 2017-05-18 DIAGNOSIS — C50412 Malignant neoplasm of upper-outer quadrant of left female breast: Secondary | ICD-10-CM | POA: Diagnosis not present

## 2017-05-19 ENCOUNTER — Ambulatory Visit
Admission: RE | Admit: 2017-05-19 | Discharge: 2017-05-19 | Disposition: A | Discharge status: Home / Self Care | Payer: Medicare Other | Source: Ambulatory Visit | Attending: Radiation Oncology | Admitting: Radiation Oncology

## 2017-05-19 DIAGNOSIS — C50412 Malignant neoplasm of upper-outer quadrant of left female breast: Secondary | ICD-10-CM | POA: Diagnosis not present

## 2017-05-20 ENCOUNTER — Ambulatory Visit
Admission: RE | Admit: 2017-05-20 | Discharge: 2017-05-20 | Disposition: A | Discharge status: Home / Self Care | Payer: Medicare Other | Source: Ambulatory Visit | Attending: Radiation Oncology | Admitting: Radiation Oncology

## 2017-05-20 DIAGNOSIS — C50412 Malignant neoplasm of upper-outer quadrant of left female breast: Secondary | ICD-10-CM | POA: Diagnosis not present

## 2017-05-24 ENCOUNTER — Telehealth: Payer: Self-pay | Admitting: Hematology

## 2017-05-24 NOTE — Telephone Encounter (Signed)
Left voicemail for patient regarding their upcoming appts in November.   Added a f/u and lab with Dr. Burr Medico. Sending her a confirmation letter in the mail.

## 2017-05-26 ENCOUNTER — Encounter: Payer: Self-pay | Admitting: Radiation Oncology

## 2017-05-26 NOTE — Progress Notes (Signed)
  Radiation Oncology         [phone] ________________________________  Name: Deborah Cooke MRN: [id number]  Date: 05/26/2017  DOB: 06-19-1951  End of Treatment Note  Diagnosis:   Malignant neoplasm of upper-outer quadrant of left female breast  Indication for treatment:  Curative      Radiation treatment dates:   04/25/2017 - 05/20/2017  Site/dose:  Left breast / 42.5 Gy in 17 fractions & boost / 7.5 Gy in 3 fractions   Beams/energy:   Photons/ 15X, 6X    Narrative: The patient tolerated radiation treatment relatively well.    Plan: The patient has completed radiation treatment. The patient will return to radiation oncology clinic for routine followup in one month. I advised them to call or return sooner if they have any questions or concerns related to their recovery or treatment.  ------------------------------------------------  Jodelle Gross, MD, PhD  This document serves as a record of services personally performed by Kyung Rudd, MD. It was created on his behalf by Valeta Harms, a trained medical scribe. The creation of this record is based on the scribe's personal observations and the provider's statements to them. This document has been checked and approved by the attending provider.

## 2017-06-30 ENCOUNTER — Telehealth: Payer: Self-pay | Admitting: *Deleted

## 2017-06-30 NOTE — Telephone Encounter (Signed)
Called patient to ask about rescheduling fu on 07-07-17 @ 2:30 pm , rescheduled to 07-07-17 @ 10 am, patient agreed to new time

## 2017-07-07 ENCOUNTER — Other Ambulatory Visit: Payer: Self-pay | Admitting: *Deleted

## 2017-07-07 ENCOUNTER — Ambulatory Visit
Admission: RE | Admit: 2017-07-07 | Discharge: 2017-07-07 | Disposition: A | Discharge status: Home / Self Care | Payer: Medicare Other | Source: Ambulatory Visit | Attending: Radiation Oncology | Admitting: Radiation Oncology

## 2017-07-07 ENCOUNTER — Encounter: Payer: Self-pay | Admitting: Radiation Oncology

## 2017-07-07 ENCOUNTER — Other Ambulatory Visit: Payer: Self-pay | Admitting: Radiation Oncology

## 2017-07-07 VITALS — BP 119/73 | HR 61 | Temp 98.4°F | Resp 20 | Ht 61.0 in | Wt 201.0 lb

## 2017-07-07 DIAGNOSIS — C50412 Malignant neoplasm of upper-outer quadrant of left female breast: Secondary | ICD-10-CM | POA: Insufficient documentation

## 2017-07-07 DIAGNOSIS — Z87891 Personal history of nicotine dependence: Secondary | ICD-10-CM | POA: Diagnosis not present

## 2017-07-07 DIAGNOSIS — G8929 Other chronic pain: Secondary | ICD-10-CM | POA: Diagnosis not present

## 2017-07-07 DIAGNOSIS — F419 Anxiety disorder, unspecified: Secondary | ICD-10-CM | POA: Insufficient documentation

## 2017-07-07 DIAGNOSIS — F329 Major depressive disorder, single episode, unspecified: Secondary | ICD-10-CM | POA: Insufficient documentation

## 2017-07-07 DIAGNOSIS — Z9884 Bariatric surgery status: Secondary | ICD-10-CM | POA: Diagnosis not present

## 2017-07-07 DIAGNOSIS — M542 Cervicalgia: Secondary | ICD-10-CM | POA: Insufficient documentation

## 2017-07-07 DIAGNOSIS — K219 Gastro-esophageal reflux disease without esophagitis: Secondary | ICD-10-CM | POA: Diagnosis not present

## 2017-07-07 DIAGNOSIS — E039 Hypothyroidism, unspecified: Secondary | ICD-10-CM | POA: Diagnosis not present

## 2017-07-07 DIAGNOSIS — Z170 Estrogen receptor positive status [ER+]: Principal | ICD-10-CM

## 2017-07-07 MED ORDER — TAMOXIFEN CITRATE 20 MG PO TABS: 20.0000 mg | ORAL_TABLET | Freq: Every day | ORAL | 3 refills | Status: DC

## 2017-07-07 NOTE — Telephone Encounter (Signed)
Received RX request from [facility] form 90 day supply of tamoxifen.  Called pt & she would like this to be done b/c it doesn't cost her anything. Will e-scribe.  She reports doing well on the tamoxifen.

## 2017-07-07 NOTE — Progress Notes (Signed)
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 During treatment she did very well with radiotherapy and did not have significant desquamation.                             On review of systems, the patient states she's doing well overall. She reports her skin is still somewhat tan in color. She has noticed edema in her LUE and was seen by her PCP who ordered doppler ultrasound to rule out DVT. Fortunately this was negative. She is tolerating tamoxifen well complaints otherwise.   ALLERGIES:  is allergic to nsaids; ibuprofen; and tolmetin.  Meds: Current Outpatient Prescriptions  Medication Sig Dispense Refill  . B Complex Vitamins (B COMPLEX 100 PO) Take 1 tablet by mouth daily.    . Biotin 1000 MCG tablet Take 5,000 mcg by mouth daily.     . Calcium Carbonate (CALCIUM 600 PO) Take 1 tablet by mouth daily.     . cholecalciferol (VITAMIN D) 400 units TABS tablet Take 400 Units by mouth daily.    . ferrous sulfate 325 (65 FE) MG EC tablet Take 325 mg by mouth 3 (three) times daily with meals.    . gabapentin (NEURONTIN) 800 MG tablet Take 800 mg by mouth 4 (four) times daily.     . hydrOXYzine (ATARAX/VISTARIL) 25 MG tablet Take 25 mg by mouth daily as needed.   0  . latanoprost (XALATAN) 0.005 % ophthalmic solution Place 1 drop into both eyes daily. At night    . levothyroxine (SYNTHROID, LEVOTHROID) 50 MCG tablet Take 50 mcg by mouth daily.    Marland Kitchen liothyronine (CYTOMEL) 25 MCG tablet Take 25 mcg by mouth daily.    .  Multiple Vitamins-Iron (MULTIVITAMIN/IRON PO) Take 1 tablet by mouth daily.     Marland Kitchen oxyCODONE-acetaminophen (PERCOCET) 10-325 MG tablet Take 1 tablet by mouth 4 (four) times daily.    . potassium chloride (K-DUR,KLOR-CON) 10 MEQ tablet Take 10 mEq by mouth daily.    . QUEtiapine (SEROQUEL XR) 300 MG 24 hr tablet Take 300 mg by mouth daily.    . ranitidine (ZANTAC) 300 MG tablet Take 300 mg by mouth daily.    . tamoxifen (NOLVADEX) 20 MG tablet Take 1 tablet (20 mg total) by mouth daily. 30 tablet 2  . tiZANidine (ZANAFLEX) 4 MG tablet Take 4 mg by mouth 4 (four) times daily.     . traZODone (DESYREL) 100 MG tablet Take 100 mg by mouth at bedtime.    Marland Kitchen venlafaxine (EFFEXOR) 75 MG tablet Take 75 mg by mouth at bedtime.    . Vitamin D, Ergocalciferol, (DRISDOL) 50000 UNITS CAPS Take 50,000 Units by mouth every 7 (seven) days. Does not take on any specific day of the week     No current facility-administered medications for this encounter.     Physical Findings:  height is 5\' 1"  (1.549 m) and weight is  201 lb (91.2 kg). Her oral temperature is 98.4 F (36.9 C). Her blood pressure is 119/73 and her pulse is 61. Her respiration is 20 and oxygen saturation is 97%.  Pain Assessment Pain Score: 6  (Neck and head)/10 In general this is a well appearing caucasian female in no acute distress. She's alert and oriented x4 and appropriate throughout the examination. Cardiopulmonary assessment is negative for acute distress and she exhibits normal effort. The left breast was examined and reveals hyperpigmentation in the field with mild chest wall edema, and 1+ pitting edema of the LUE most notably over the hand and forearm with several excoriations on the forearm. No erythema to suggest lymphangitis is present.    Lab Findings: Lab Results  Component Value Date   WBC 6.0 05/16/2017   HGB 12.0 05/16/2017   HCT 36.6 05/16/2017   MCV 85.5 05/16/2017   PLT 280 05/16/2017     Radiographic Findings: No  results found.  Impression/Plan: 1. 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 The patient has been doing well since completion of radiotherapy. We discussed that we would be happy to continue to follow her as needed, but she will also continue to follow up with Dr. Burr Medico in medical oncology. She was counseled on skin care as well as measures to avoid sun exposure to this area.  2. LUE lymphedema. The patient is counseled on the findings on exam and it appears that the excoriations are from her dog pawing at her. I encouraged her to wear long sleeves and to talk with her vet about having her dog's nails groomed more frequently as she could develop lymphangitis. She will be referred to [facility] for lymphedema management with Cassie Downtin, PT.  3. Survivorship. She will meet with survivorship in November as well.      Carola Rhine, PAC

## 2017-07-18 ENCOUNTER — Telehealth: Payer: Self-pay | Admitting: Radiation Oncology

## 2017-07-18 NOTE — Telephone Encounter (Signed)
I spoke with the patient and will contact her PT to find out how her prescription for compression should be written.

## 2017-07-21 ENCOUNTER — Ambulatory Visit: Payer: Medicare Other | Admitting: Physical Therapy

## 2017-07-22 ENCOUNTER — Encounter: Payer: Self-pay | Admitting: *Deleted

## 2017-07-22 NOTE — Progress Notes (Signed)
1700 07-21-17 Faxed order for compression sleeve and glove to [facility] to Lamount Cohen, PT per Shona Simpson, PA-C.

## 2017-08-19 ENCOUNTER — Other Ambulatory Visit (HOSPITAL_BASED_OUTPATIENT_CLINIC_OR_DEPARTMENT_OTHER): Payer: Medicare Other

## 2017-08-19 ENCOUNTER — Ambulatory Visit (HOSPITAL_BASED_OUTPATIENT_CLINIC_OR_DEPARTMENT_OTHER): Payer: Medicare Other | Admitting: Adult Health

## 2017-08-19 ENCOUNTER — Telehealth: Payer: Self-pay | Admitting: Adult Health

## 2017-08-19 ENCOUNTER — Encounter: Payer: Medicare Other | Admitting: Hematology

## 2017-08-19 VITALS — BP 167/86 | HR 77 | Temp 98.1°F | Resp 24 | Ht 61.0 in | Wt 206.2 lb

## 2017-08-19 DIAGNOSIS — M810 Age-related osteoporosis without current pathological fracture: Secondary | ICD-10-CM

## 2017-08-19 DIAGNOSIS — Z170 Estrogen receptor positive status [ER+]: Principal | ICD-10-CM

## 2017-08-19 DIAGNOSIS — I890 Lymphedema, not elsewhere classified: Secondary | ICD-10-CM

## 2017-08-19 DIAGNOSIS — N951 Menopausal and female climacteric states: Secondary | ICD-10-CM

## 2017-08-19 DIAGNOSIS — C50412 Malignant neoplasm of upper-outer quadrant of left female breast: Secondary | ICD-10-CM

## 2017-08-19 DIAGNOSIS — M5481 Occipital neuralgia: Secondary | ICD-10-CM | POA: Diagnosis not present

## 2017-08-19 DIAGNOSIS — Z79810 Long term (current) use of selective estrogen receptor modulators (SERMs): Secondary | ICD-10-CM | POA: Diagnosis not present

## 2017-08-19 DIAGNOSIS — F418 Other specified anxiety disorders: Secondary | ICD-10-CM | POA: Diagnosis not present

## 2017-08-19 LAB — CBC WITH DIFFERENTIAL/PLATELET
BASO%: 0.6 % (ref 0.0–2.0)
Basophils Absolute: 0 10*3/uL (ref 0.0–0.1)
EOS ABS: 0.1 10*3/uL (ref 0.0–0.5)
EOS%: 2.8 % (ref 0.0–7.0)
HCT: 37.8 % (ref 34.8–46.6)
HGB: 12.4 g/dL (ref 11.6–15.9)
LYMPH%: 25.3 % (ref 14.0–49.7)
MCH: 29.5 pg (ref 25.1–34.0)
MCHC: 32.8 g/dL (ref 31.5–36.0)
MCV: 89.8 fL (ref 79.5–101.0)
MONO#: 0.3 10*3/uL (ref 0.1–0.9)
MONO%: 6.9 % (ref 0.0–14.0)
NEUT#: 3 10*3/uL (ref 1.5–6.5)
NEUT%: 64.4 % (ref 38.4–76.8)
PLATELETS: 233 10*3/uL (ref 145–400)
RBC: 4.21 10*6/uL (ref 3.70–5.45)
RDW: 14.5 % (ref 11.2–14.5)
WBC: 4.7 10*3/uL (ref 3.9–10.3)
lymph#: 1.2 10*3/uL (ref 0.9–3.3)

## 2017-08-19 LAB — COMPREHENSIVE METABOLIC PANEL
ALBUMIN: 3.5 g/dL (ref 3.5–5.0)
ALK PHOS: 57 U/L (ref 40–150)
ALT: 19 U/L (ref 0–55)
AST: 30 U/L (ref 5–34)
Anion Gap: 8 mEq/L (ref 3–11)
BUN: 18.3 mg/dL (ref 7.0–26.0)
CHLORIDE: 105 meq/L (ref 98–109)
CO2: 28 mEq/L (ref 22–29)
Calcium: 9.2 mg/dL (ref 8.4–10.4)
Creatinine: 0.9 mg/dL (ref 0.6–1.1)
EGFR: >60 mL/min/{1.73_m2} (ref >60)
GLUCOSE: 84 mg/dL (ref 70–140)
POTASSIUM: 4.5 meq/L (ref 3.5–5.1)
Sodium: 141 mEq/L (ref 136–145)
Total Bilirubin: 0.31 mg/dL (ref 0.20–1.20)
Total Protein: 6.8 g/dL (ref 6.4–8.3)
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
## 2017-08-19 NOTE — Telephone Encounter (Signed)
Gave patient AVS and calendar of upcoming May appointments.  °

## 2017-08-21 ENCOUNTER — Encounter: Payer: Self-pay | Admitting: Adult Health

## 2017-08-21 NOTE — Progress Notes (Signed)
This encounter was created in error - please disregard.

## 2017-08-22 NOTE — Telephone Encounter (Signed)
Spoke to patient regarding upcoming May appointments.

## 2017-09-07 ENCOUNTER — Encounter: Payer: Self-pay | Admitting: Hematology

## 2017-12-06 ENCOUNTER — Telehealth: Payer: Self-pay | Admitting: Hematology

## 2017-12-06 NOTE — Telephone Encounter (Signed)
Appointment moved per Dr Burr Medico per email msge / Calendar / letter mailed to patient.

## 2018-01-18 ENCOUNTER — Ambulatory Visit: Payer: Medicare Other | Admitting: Hematology

## 2018-01-18 ENCOUNTER — Other Ambulatory Visit: Payer: Medicare Other

## 2018-01-25 ENCOUNTER — Other Ambulatory Visit: Payer: Medicare Other

## 2018-01-25 ENCOUNTER — Ambulatory Visit: Payer: Medicare Other | Admitting: Hematology

## 2018-01-30 ENCOUNTER — Ambulatory Visit: Payer: Medicare Other | Admitting: Hematology

## 2018-01-30 ENCOUNTER — Other Ambulatory Visit: Payer: Medicare Other
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
## 2018-01-31 ENCOUNTER — Telehealth: Payer: Self-pay | Admitting: Hematology

## 2018-01-31 ENCOUNTER — Inpatient Hospital Stay (HOSPITAL_BASED_OUTPATIENT_CLINIC_OR_DEPARTMENT_OTHER): Payer: Medicare Other | Admitting: Hematology

## 2018-01-31 ENCOUNTER — Encounter: Payer: Self-pay | Admitting: Hematology

## 2018-01-31 ENCOUNTER — Inpatient Hospital Stay: Payer: Medicare Other | Attending: Hematology

## 2018-01-31 VITALS — BP 121/82 | HR 66 | Temp 98.0°F | Resp 18 | Ht 61.0 in | Wt 189.6 lb

## 2018-01-31 DIAGNOSIS — C50412 Malignant neoplasm of upper-outer quadrant of left female breast: Secondary | ICD-10-CM | POA: Diagnosis not present

## 2018-01-31 DIAGNOSIS — Z170 Estrogen receptor positive status [ER+]: Secondary | ICD-10-CM

## 2018-01-31 DIAGNOSIS — Z79810 Long term (current) use of selective estrogen receptor modulators (SERMs): Secondary | ICD-10-CM | POA: Diagnosis not present

## 2018-01-31 DIAGNOSIS — M818 Other osteoporosis without current pathological fracture: Secondary | ICD-10-CM | POA: Insufficient documentation

## 2018-01-31 DIAGNOSIS — Z79899 Other long term (current) drug therapy: Secondary | ICD-10-CM | POA: Diagnosis not present

## 2018-01-31 DIAGNOSIS — G8929 Other chronic pain: Secondary | ICD-10-CM

## 2018-01-31 DIAGNOSIS — F419 Anxiety disorder, unspecified: Secondary | ICD-10-CM

## 2018-01-31 DIAGNOSIS — Z87891 Personal history of nicotine dependence: Secondary | ICD-10-CM | POA: Diagnosis not present

## 2018-01-31 LAB — COMPREHENSIVE METABOLIC PANEL
ALK PHOS: 62 U/L (ref 40–150)
ALT: 21 U/L (ref 0–55)
AST: 27 U/L (ref 5–34)
Albumin: 3.5 g/dL (ref 3.5–5.0)
Anion gap: 5 (ref 3–11)
BUN: 18 mg/dL (ref 7–26)
CO2: 31 mmol/L — AB (ref 22–29)
CREATININE: 0.9 mg/dL (ref 0.60–1.10)
Calcium: 9.2 mg/dL (ref 8.4–10.4)
Chloride: 106 mmol/L (ref 98–109)
GFR calc Af Amer: >60 mL/min (ref >60)
Glucose, Bld: 79 mg/dL (ref 70–140)
Potassium: 5 mmol/L (ref 3.5–5.1)
SODIUM: 142 mmol/L (ref 136–145)
Total Bilirubin: 0.3 mg/dL (ref 0.2–1.2)
Total Protein: 6.5 g/dL (ref 6.4–8.3)

## 2018-01-31 LAB — CBC WITH DIFFERENTIAL/PLATELET
Basophils Absolute: 0 10*3/uL (ref 0.0–0.1)
Basophils Relative: 1 %
EOS ABS: 0.2 10*3/uL (ref 0.0–0.5)
EOS PCT: 4 %
HCT: 36 % (ref 34.8–46.6)
Hemoglobin: 11.9 g/dL (ref 11.6–15.9)
Lymphocytes Relative: 30 %
Lymphs Abs: 1.5 10*3/uL (ref 0.9–3.3)
MCH: 30 pg (ref 25.1–34.0)
MCHC: 33.1 g/dL (ref 31.5–36.0)
MCV: 90.6 fL (ref 79.5–101.0)
MONOS PCT: 11 %
Monocytes Absolute: 0.6 10*3/uL (ref 0.1–0.9)
Neutro Abs: 2.7 10*3/uL (ref 1.5–6.5)
Neutrophils Relative %: 54 %
PLATELETS: 271 10*3/uL (ref 145–400)
RBC: 3.97 MIL/uL (ref 3.70–5.45)
RDW: 14.3 % (ref 11.2–14.5)
WBC: 5 10*3/uL (ref 3.9–10.3)

## 2018-01-31 NOTE — Telephone Encounter (Signed)
Appointments scheduled AVS/Calendar printed per 5/14 los °

## 2018-02-01 ENCOUNTER — Encounter: Payer: Self-pay | Admitting: Hematology

## 2018-06-12 ENCOUNTER — Other Ambulatory Visit: Payer: Self-pay | Admitting: Hematology

## 2018-08-02 ENCOUNTER — Telehealth: Payer: Self-pay | Admitting: Hematology

## 2018-08-02 NOTE — Telephone Encounter (Signed)
Called patient per YF - r/s appt from 11/14 to 11/26 per YF - left message for patient with appt date and time

## 2018-08-03 ENCOUNTER — Inpatient Hospital Stay: Payer: Medicare Other

## 2018-08-03 ENCOUNTER — Inpatient Hospital Stay: Payer: Medicare Other | Admitting: Hematology
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
## 2018-08-15 ENCOUNTER — Inpatient Hospital Stay (HOSPITAL_BASED_OUTPATIENT_CLINIC_OR_DEPARTMENT_OTHER): Payer: Medicare Other | Admitting: Hematology

## 2018-08-15 ENCOUNTER — Telehealth: Payer: Self-pay

## 2018-08-15 ENCOUNTER — Encounter: Payer: Self-pay | Admitting: Hematology

## 2018-08-15 ENCOUNTER — Inpatient Hospital Stay: Payer: Medicare Other | Attending: Hematology

## 2018-08-15 VITALS — BP 136/76 | HR 72 | Temp 97.7°F | Resp 20 | Ht 61.0 in | Wt 192.1 lb

## 2018-08-15 DIAGNOSIS — M85852 Other specified disorders of bone density and structure, left thigh: Secondary | ICD-10-CM

## 2018-08-15 DIAGNOSIS — M1990 Unspecified osteoarthritis, unspecified site: Secondary | ICD-10-CM | POA: Insufficient documentation

## 2018-08-15 DIAGNOSIS — Z923 Personal history of irradiation: Secondary | ICD-10-CM

## 2018-08-15 DIAGNOSIS — Z79810 Long term (current) use of selective estrogen receptor modulators (SERMs): Secondary | ICD-10-CM | POA: Insufficient documentation

## 2018-08-15 DIAGNOSIS — Z170 Estrogen receptor positive status [ER+]: Secondary | ICD-10-CM | POA: Insufficient documentation

## 2018-08-15 DIAGNOSIS — F418 Other specified anxiety disorders: Secondary | ICD-10-CM | POA: Diagnosis not present

## 2018-08-15 DIAGNOSIS — Z79899 Other long term (current) drug therapy: Secondary | ICD-10-CM | POA: Diagnosis not present

## 2018-08-15 DIAGNOSIS — I890 Lymphedema, not elsewhere classified: Secondary | ICD-10-CM | POA: Insufficient documentation

## 2018-08-15 DIAGNOSIS — C50412 Malignant neoplasm of upper-outer quadrant of left female breast: Secondary | ICD-10-CM | POA: Insufficient documentation

## 2018-08-15 LAB — COMPREHENSIVE METABOLIC PANEL
ALT: 22 U/L (ref 0–44)
AST: 32 U/L (ref 15–41)
Albumin: 3.9 g/dL (ref 3.5–5.0)
Alkaline Phosphatase: 55 U/L (ref 38–126)
Anion gap: 8 (ref 5–15)
BUN: 16 mg/dL (ref 8–23)
CHLORIDE: 103 mmol/L (ref 98–111)
CO2: 30 mmol/L (ref 22–32)
CREATININE: 0.83 mg/dL (ref 0.44–1.00)
Calcium: 9.3 mg/dL (ref 8.9–10.3)
GFR calc non Af Amer: >60 mL/min (ref >60)
Glucose, Bld: 92 mg/dL (ref 70–99)
Potassium: 5 mmol/L (ref 3.5–5.1)
SODIUM: 141 mmol/L (ref 135–145)
Total Bilirubin: 0.4 mg/dL (ref 0.3–1.2)
Total Protein: 6.7 g/dL (ref 6.5–8.1)

## 2018-08-15 LAB — CBC WITH DIFFERENTIAL/PLATELET
Abs Immature Granulocytes: 0 10*3/uL (ref 0.00–0.07)
BASOS ABS: 0.1 10*3/uL (ref 0.0–0.1)
Basophils Relative: 1 %
EOS PCT: 3 %
Eosinophils Absolute: 0.1 10*3/uL (ref 0.0–0.5)
HCT: 38.7 % (ref 36.0–46.0)
Hemoglobin: 12.6 g/dL (ref 12.0–15.0)
Immature Granulocytes: 0 %
LYMPHS ABS: 1.3 10*3/uL (ref 0.7–4.0)
Lymphocytes Relative: 32 %
MCH: 30.1 pg (ref 26.0–34.0)
MCHC: 32.6 g/dL (ref 30.0–36.0)
MCV: 92.4 fL (ref 80.0–100.0)
Monocytes Absolute: 0.4 10*3/uL (ref 0.1–1.0)
Monocytes Relative: 11 %
NRBC: 0 % (ref 0.0–0.2)
Neutro Abs: 2.1 10*3/uL (ref 1.7–7.7)
Neutrophils Relative %: 53 %
Platelets: 238 10*3/uL (ref 150–400)
RBC: 4.19 MIL/uL (ref 3.87–5.11)
RDW: 12.9 % (ref 11.5–15.5)
WBC: 4 10*3/uL (ref 4.0–10.5)

## 2018-08-15 NOTE — Telephone Encounter (Signed)
Printed avs and calender of upcoming appointment. Per 11/26 los 

## 2018-09-04 ENCOUNTER — Telehealth: Payer: Self-pay

## 2018-09-04 NOTE — Telephone Encounter (Signed)
Faxed signed order for mammogram to [facility], sent to HIM to scan.

## 2018-09-14 ENCOUNTER — Encounter: Payer: Self-pay | Admitting: Hematology

## 2019-02-13 ENCOUNTER — Telehealth: Payer: Self-pay | Admitting: Hematology

## 2019-02-13 NOTE — Telephone Encounter (Signed)
Called patient regarding upcoming [facility] appointment, per patient's request this will be a telephone visit.  °

## 2019-02-14 ENCOUNTER — Telehealth: Payer: Self-pay | Admitting: Hematology

## 2019-02-14 NOTE — Progress Notes (Signed)
[place]   [phone] [phone]   Clinic Follow up Note   Patient Care Team: Rich Fuchs, PA as PCP - General (Physician Assistant) Kyung Rudd, MD as Consulting Physician (Radiation Oncology) Truitt Merle, MD as Consulting Physician (Hematology) Lorelle Gibbs, MD as Consulting Physician (Radiology) [name] Overall, MD as Consulting Physician (General Surgery) Delice Bison Charlestine Massed, NP as Nurse Practitioner (Hematology and Oncology)   I connected with [name] on 02/15/2019 at  1:00 PM EDT by telephone visit and verified that I am speaking with the correct person using two identifiers.  I discussed the limitations, risks, security and privacy concerns of performing an evaluation and management service by telephone and the availability of in person appointments. I also discussed with the patient that there may be a patient responsible charge related to this service. The patient expressed understanding and agreed to proceed.   Patient's location:  Her home  Provider's location:  My Office   CHIEF COMPLAINT: F/u of left breast cancer   SUMMARY OF ONCOLOGIC HISTORY: Oncology History   Cancer Staging Malignant neoplasm of upper-outer quadrant of left breast in female, estrogen receptor positive ([place]) Staging form: Breast, AJCC 8th Edition - Clinical: cT1b, cN0, cM0, G1, ER: Positive, PR: Positive - Unsigned - Pathologic stage from 03/15/2017: Stage IA (pT1b, pN0, cM0, G1, ER: Positive, PR: Positive, HER2: Negative) - Signed by Truitt Merle, MD on 03/19/2017       Malignant neoplasm of upper-outer quadrant of left breast in female, estrogen receptor positive ([place])   02/08/2017 Mammogram    Diagnostic mammogram Screening 02/08/17 IMPRESSION:  Addidiontal Imaging Evaluation needed The new oval mass in the left breast is indeterminate. 3D mediolateral, and spot copression views as well as an ultrasound are recommended    02/10/2017  Mammogram    Mammogram of the left breast 02/10/17 The 6 mm nodule in the left breast is indeterminate. An ultrasound is recommended.      02/10/2017 Imaging    [place] of Left Breast 02/10/17 The 6 mm lymph node with focal corytex thickening in the left breast is at a low suspicion for malignancy. An Ultrasound guided biopsy is recommended.     02/16/2017 Receptors her2    ER: 90% Positive PR: 80% Positive Ki67: 3% HER2: Negative    02/16/2017 Initial Biopsy    Diagnosis 02/16/17 Breast, left, needle core biopsy - INVASIVE DUCTAL CARCINOMA - SEE COMMENT     03/11/2017 Initial Diagnosis    Malignant neoplasm of upper-outer quadrant of left breast in female, estrogen receptor positive ([place])    03/15/2017 Surgery    LEFT BREAST LUMPECTOMY WITH RADIOACTIVE SEED AND LEFT AXILLARY SENTINEL LYMPH NODE BIOPSY by Dr Lucia Gaskins     03/15/2017 Pathology Results    Diagnosis 03/15/17 1. 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 - RESECTION MARGINS ARE NEGATIVE. - BIOPSY SITE. - SCLEROSING ADENOSIS, FIBROCYSTIC CHANGE. - SEE ONCOLOGY TABLE. 2. Lymph node, sentinel, biopsy, Left axillary - ONE OF ONE LYMPH NODES NEGATIVE FOR CARCINOMA (0/1). 3. Lymph node, sentinel, biopsy, Left - ONE OF ONE LYMPH NODES NEGATIVE FOR CARCINOMA (0/1). 4. Lymph node, sentinel, biopsy, Left - ONE OF ONE LYMPH NODES NEGATIVE FOR CARCINOMA (0/1). 5. Lymph node, sentinel, biopsy, Left - ONE OF ONE LYMPH NODES NEGATIVE FOR CARCINOMA (0/1). 6. Lymph node, sentinel, biopsy, Left - ONE OF ONE LYMPH NODES NEGATIVE FOR CARCINOMA (0/1).  04/20/2017 Genetic Testing    Patient had genetic testing due to a family history of breast cancer and Ashkenazi Jewish ancestry.  She was tested for the [facility] Common Hereditary Cancers Panel.  The Hereditary Gene Panel offered by [facility] includes sequencing and/or deletion duplication testing of the following 46  genes: APC, ATM, AXIN2, BARD1, BMPR1A, BRCA1, BRCA2, BRIP1, CDH1, CDKN2A (p14ARF), CDKN2A (p16INK4a), CHEK2, CTNNA1, DICER1, EPCAM (Deletion/duplication testing only), GREM1 (promoter region deletion/duplication testing only), KIT, MEN1, MLH1, MSH2, MSH3, MSH6, MUTYH, NBN, NF1, NHTL1, PALB2, PDGFRA, PMS2, POLD1, POLE, PTEN, RAD50, RAD51C, RAD51D, SDHB, SDHC, SDHD, SMAD4, SMARCA4. STK11, TP53, TSC1, TSC2, and VHL.  The following genes were evaluated for sequence changes only: SDHA and HOXB13 c.251G>A variant only.  Results: No pathogenic mutations identified in the 46 genes analyzed.   The date of this test report is 04/20/2017.     04/25/2017 - 05/20/2017 Radiation Therapy    Adjuvant breast radiation, Dr. Lisbeth Renshaw   Radiation treatment dates:   04/25/2017 - 05/20/2017  Site/dose:  Left breast / 42.5 Gy in 17 fractions & boost / 7.5 Gy in 3 fractions   Beams/energy:   Photons/ 15X, 6X    Narrative: The patient tolerated radiation treatment relatively well.       05/2017 -  Anti-estrogen oral therapy    Tamoxifen once daily starting in 05/2017       CURRENT THERAPY:  Tamoxifen 44m daily started 06/2017  INTERVAL HISTORY:  ETHEKLA COLBORNis here for a follow up of left breast cancer. She was able to identify herself by birth date.  She notes she is doing well. She is staying home and doing COVID-19 precautions. She notes recent labs with [facility] were adequate. She is taking Tamoxifen with no side effects.  She notes due to prior nerve damage she has chronic Headaches.      REVIEW OF SYSTEMS:   Constitutional: Denies fevers, chills or abnormal weight loss Eyes: Denies blurriness of vision Ears, nose, mouth, throat, and face: Denies mucositis or sore throat Respiratory: Denies cough, dyspnea or wheezes Cardiovascular: Denies palpitation, chest discomfort or lower extremity swelling Gastrointestinal:  Denies nausea, heartburn or change in bowel habits Skin: Denies abnormal skin rashes  Lymphatics: Denies new lymphadenopathy or easy bruising Neurological:Denies numbness, tingling or new weaknesses (+) chronic HA Behavioral/Psych: Mood is stable, no new changes  All other systems were reviewed with the patient and are negative.  MEDICAL HISTORY:  Past Medical History:  Diagnosis Date  . Adverse effect of anesthetic 01/2012   difficulty waking up.  . Adverse effect of unspecified anesthetic, initial encounter 08/2011   low o2 sats.  . Anemia   . Anxiety   . Arthritis   . Breast cancer ([facility] 02/16/2017   left breast  . Chronic pain   . Closed supracondylar fracture of right femur ([facility] 09/18/2012  . Depression   . Family history of breast cancer   . GERD (gastroesophageal reflux disease)   . Headache(784.0)   . Hypothyroidism   . Opioid dependence in controlled environment ([place]   . Osteoporosis   . Osteoporosis with fracture 09/19/2012  . Thyroid disease     SURGICAL HISTORY: Past Surgical History:  Procedure Laterality Date  . BREAST BIOPSY Left 02/16/2017  . BREAST LUMPECTOMY WITH RADIOACTIVE SEED AND SENTINEL LYMPH NODE BIOPSY Left 03/15/2017   Procedure: LEFT BREAST LUMPECTOMY WITH RADIOACTIVE SEED AND LEFT AXILLARY SENTINEL LYMPH NODE BIOPSY;  Surgeon: NAlphonsa Overall MD;  [place] [facility]  Service: General;  Laterality: Left;  . CHOLECYSTECTOMY  10/1975  . FEMUR IM NAIL  09/18/2012   Procedure: INTRAMEDULLARY (IM) RETROGRADE FEMORAL NAILING;  Surgeon: [name], MD;  Location: [place];  Service: Orthopedics;  Laterality: Right;  . GASTRIC BYPASS  2003  . JOINT REPLACEMENT    . REPLACEMENT TOTAL KNEE  2003   right and left -4 months aoart.  . TONSILLECTOMY    . WRIST FRACTURE SURGERY  08/2011    I have reviewed the social history and family history with the patient and they are unchanged from previous note.  ALLERGIES:  is allergic to nsaids; ibuprofen; and tolmetin.  MEDICATIONS:  Current Outpatient Medications   Medication Sig Dispense Refill  . AIMOVIG 140 MG/ML SOAJ Inject 140 mg as directed every 30 (thirty) days.    . B Complex Vitamins (B COMPLEX 100 PO) Take 1 tablet by mouth daily.    . baclofen (LIORESAL) 10 MG tablet 10 mg 3 (three) times daily.     . Biotin 1000 MCG tablet Take 5,000 mcg by mouth daily.     . Calcium Carbonate (CALCIUM 600 PO) Take 1 tablet by mouth daily.     . carbamazepine (TEGRETOL XR) 100 MG 12 hr tablet Take 2 tablets by mouth 2 (two) times daily.    . cholecalciferol (VITAMIN D) 400 units TABS tablet Take 400 Units by mouth daily.    . ferrous sulfate 325 (65 FE) MG EC tablet Take 325 mg by mouth 3 (three) times daily with meals.    . gabapentin (NEURONTIN) 800 MG tablet Take 800 mg by mouth 4 (four) times daily.     . hydrOXYzine (ATARAX/VISTARIL) 50 MG tablet Take 50 mg by mouth daily as needed.   0  . latanoprost (XALATAN) 0.005 % ophthalmic solution Place 1 drop into both eyes daily. At night    . levothyroxine (SYNTHROID, LEVOTHROID) 50 MCG tablet Take 50 mcg by mouth daily.    Marland Kitchen liothyronine (CYTOMEL) 25 MCG tablet Take 25 mcg by mouth daily.    . Multiple Vitamins-Iron (MULTIVITAMIN/IRON PO) Take 1 tablet by mouth daily.     . potassium chloride (K-DUR,KLOR-CON) 10 MEQ tablet Take 10 mEq by mouth daily.    . QUEtiapine (SEROQUEL XR) 300 MG 24 hr tablet Take 300 mg by mouth daily.    . ranitidine (ZANTAC) 300 MG tablet Take 300 mg by mouth daily.    . tamoxifen (NOLVADEX) 20 MG tablet TAKE 1 TABLET BY MOUTH  DAILY 90 tablet 3  . traZODone (DESYREL) 100 MG tablet Take 100 mg by mouth at bedtime.    Marland Kitchen venlafaxine (EFFEXOR) 75 MG tablet Take 75 mg by mouth at bedtime.    Marland Kitchen XTAMPZA ER 18 MG C12A TK ONE C PO Q 12 H FOR 30 DAYS  0   No current facility-administered medications for this visit.     PHYSICAL EXAMINATION: ECOG PERFORMANCE STATUS: 0 - Asymptomatic  No vitals taken today, Exam not performed today   LABORATORY DATA:  I have reviewed the data as  listed CBC Latest Ref Rng & Units 08/15/2018 01/31/2018 08/19/2017  WBC 4.0 - 10.5 K/uL 4.0 5.0 4.7  Hemoglobin 12.0 - 15.0 g/dL 12.6 11.9 12.4  Hematocrit 36.0 - 46.0 % 38.7 36.0 37.8  Platelets 150 - 400 K/uL 238 271 233     CMP Latest Ref Rng & Units 08/15/2018 01/31/2018 08/19/2017  Glucose 70 - 99 mg/dL 92 79 84  BUN 8 - 23  mg/dL 16 18 18.3  Creatinine 0.44 - 1.00 mg/dL 0.83 0.90 0.9  Sodium 135 - 145 mmol/L 141 142 141  Potassium 3.5 - 5.1 mmol/L 5.0 5.0 4.5  Chloride 98 - 111 mmol/L 103 106 -  CO2 22 - 32 mmol/L 30 31(H) 28  Calcium 8.9 - 10.3 mg/dL 9.3 9.2 9.2  Total Protein 6.5 - 8.1 g/dL 6.7 6.5 6.8  Total Bilirubin 0.3 - 1.2 mg/dL 0.4 0.3 0.31  Alkaline Phos 38 - 126 U/L 55 62 57  AST 15 - 41 U/L 32 27 30  ALT 0 - 44 U/L _0 RADIOGRAPHIC STUDIES: I have personally reviewed the radiological images as listed and agreed with the findings in the report. No results found.   ASSESSMENT & PLAN:  Deborah Cooke is a 68 y.o. female with   1. Malignant neoplasm of upper-outer quadrant of left breast in female, estrogen receptor positive, pT1bN0M0, Stage 1A, ER/PR Positive, Her2 negative, Grade 1 -She was diagnosed in 01/2017. She is s/p left breat lumpectomy and SLN biopsy and adjuvant radiation  -She is currently on adjuvant Tamoxifen, and is tolerating well.  -She is clinically doing well. Labs from [facility] were reviewed, CMP overall WNL. 08/2018 Mammogram was unremarkable. There is no clinical concern for recurrence.  -Continue surveillance. Next mammogram in 08/2019 -Continue Tamoxifen  -F/u in 4-5 months    2. Osteopenia  -DEXA scan in 2019 revealed T score of -1.7 at left femur  -she is on calcium and Vit D.    3. Depression and Anxiety -On Effexor. F/u with PCP -stable   4. 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   No orders of the defined types were placed in this encounter.  I discussed the assessment and treatment plan with the patient. The patient was provided an opportunity to ask questions and all were answered. The patient agreed with the plan and demonstrated an understanding of the instructions.  The patient was advised to call back or seek an in-person evaluation if the symptoms worsen or if the condition fails to improve as anticipated.  I provided 10 minutes of non face-to-face telephone visit time during this encounter, and > 50% was spent counseling as documented under my assessment & plan.    Truitt Merle, MD 02/15/2019   I, Joslyn Devon, am acting as scribe for Truitt Merle, MD.   I have reviewed the above documentation for accuracy and completeness, and I agree with the above.

## 2019-02-14 NOTE — Telephone Encounter (Signed)
Contacted pt to verify telephone visit for pre reg °

## 2019-02-15 ENCOUNTER — Inpatient Hospital Stay: Payer: Medicare Other | Attending: Hematology | Admitting: Hematology

## 2019-02-15 ENCOUNTER — Telehealth: Payer: Self-pay | Admitting: Hematology

## 2019-02-15 ENCOUNTER — Other Ambulatory Visit: Payer: Medicare Other

## 2019-02-15 ENCOUNTER — Encounter: Payer: Self-pay | Admitting: Hematology

## 2019-02-15 DIAGNOSIS — C50412 Malignant neoplasm of upper-outer quadrant of left female breast: Secondary | ICD-10-CM

## 2019-02-15 DIAGNOSIS — Z923 Personal history of irradiation: Secondary | ICD-10-CM

## 2019-02-15 DIAGNOSIS — Z79810 Long term (current) use of selective estrogen receptor modulators (SERMs): Secondary | ICD-10-CM

## 2019-02-15 DIAGNOSIS — Z170 Estrogen receptor positive status [ER+]: Secondary | ICD-10-CM

## 2019-02-15 NOTE — Telephone Encounter (Signed)
Scheduled appt per 5/28 los. °

## 2019-06-14 NOTE — Progress Notes (Signed)
Deborah [facility]oke   [phone] [phone]   Clinic Follow up Note   Patient Care Team: Rich Fuchs, PA as PCP - General (Physician Assistant) Kyung Rudd, MD as Consulting Physician (Radiation Oncology) Truitt Merle, MD as Consulting Physician (Hematology) Lorelle Gibbs, MD as Consulting Physician (Radiology) [name] Overall, MD as Consulting Physician (General Surgery) Delice Bison, Charlestine Massed, NP as Nurse Practitioner (Hematology and Oncology)  Date of Service:  06/18/2019  CHIEF COMPLAINT: F/u of left breast cancer   SUMMARY OF ONCOLOGIC HISTORY: Oncology History Overview Note  Cancer Staging Malignant neoplasm of upper-outer quadrant of left breast in female, estrogen receptor positive ([place]) Staging form: Breast, AJCC 8th Edition - Clinical: cT1b, cN0, cM0, G1, ER: Positive, PR: Positive - Unsigned - Pathologic stage from 03/15/2017: Stage IA (pT1b, pN0, cM0, G1, ER: Positive, PR: Positive, HER2: Negative) - Signed by Truitt Merle, MD on 03/19/2017     Malignant neoplasm of upper-outer quadrant of left breast in female, estrogen receptor positive ([place])  02/08/2017 Mammogram   Diagnostic mammogram Screening 02/08/17 IMPRESSION:  Addidiontal Imaging Evaluation needed The new oval mass in the left breast is indeterminate. 3D mediolateral, and spot copression views as well as an ultrasound are recommended   02/10/2017 Mammogram   Mammogram of the left breast 02/10/17 The 6 mm nodule in the left breast is indeterminate. An ultrasound is recommended.     02/10/2017 Imaging   [place] of Left Breast 02/10/17 The 6 mm lymph node with focal corytex thickening in the left breast is at a low suspicion for malignancy. An Ultrasound guided biopsy is recommended.    02/16/2017 Receptors her2   ER: 90% Positive PR: 80% Positive Ki67: 3% HER2: Negative   02/16/2017 Initial Biopsy   Diagnosis 02/16/17 Breast, left, needle core biopsy - INVASIVE DUCTAL  CARCINOMA - SEE COMMENT    03/11/2017 Initial Diagnosis   Malignant neoplasm of upper-outer quadrant of left breast in female, estrogen receptor positive ([place])   03/15/2017 Surgery   LEFT BREAST LUMPECTOMY WITH RADIOACTIVE SEED AND LEFT AXILLARY SENTINEL LYMPH NODE BIOPSY by Dr Lucia Gaskins    03/15/2017 Pathology Results   Diagnosis 03/15/17 1. 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 - RESECTION MARGINS ARE NEGATIVE. - BIOPSY SITE. - SCLEROSING ADENOSIS, FIBROCYSTIC CHANGE. - SEE ONCOLOGY TABLE. 2. Lymph node, sentinel, biopsy, Left axillary - ONE OF ONE LYMPH NODES NEGATIVE FOR CARCINOMA (0/1). 3. Lymph node, sentinel, biopsy, Left - ONE OF ONE LYMPH NODES NEGATIVE FOR CARCINOMA (0/1). 4. Lymph node, sentinel, biopsy, Left - ONE OF ONE LYMPH NODES NEGATIVE FOR CARCINOMA (0/1). 5. Lymph node, sentinel, biopsy, Left - ONE OF ONE LYMPH NODES NEGATIVE FOR CARCINOMA (0/1). 6. Lymph node, sentinel, biopsy, Left - ONE OF ONE LYMPH NODES NEGATIVE FOR CARCINOMA (0/1).    04/20/2017 Genetic Testing   Patient had genetic testing due to a family history of breast cancer and Ashkenazi Jewish ancestry.  She was tested for the [facility] Common Hereditary Cancers Panel.  The Hereditary Gene Panel offered by [facility] includes sequencing and/or deletion duplication testing of the following 46 genes: APC, ATM, AXIN2, BARD1, BMPR1A, BRCA1, BRCA2, BRIP1, CDH1, CDKN2A (p14ARF), CDKN2A (p16INK4a), CHEK2, CTNNA1, DICER1, EPCAM (Deletion/duplication testing only), GREM1 (promoter region deletion/duplication testing only), KIT, MEN1, MLH1, MSH2, MSH3, MSH6, MUTYH, NBN, NF1, NHTL1, PALB2, PDGFRA, PMS2, POLD1, POLE, PTEN, RAD50, RAD51C, RAD51D, SDHB, SDHC, SDHD, SMAD4, SMARCA4. STK11, TP53, TSC1, TSC2, and VHL.  The following genes were evaluated for sequence changes only: SDHA and HOXB13 c.251G>A variant only.  Results: No pathogenic mutations  identified in the 46 genes analyzed.   The date of this test report is 04/20/2017.    04/25/2017 - 05/20/2017 Radiation Therapy   Adjuvant breast radiation, Dr. Lisbeth Renshaw   Radiation treatment dates:   04/25/2017 - 05/20/2017  Site/dose:  Left breast / 42.5 Gy in 17 fractions & boost / 7.5 Gy in 3 fractions   Beams/energy:   Photons/ 15X, 6X    Narrative: The patient tolerated radiation treatment relatively well.      05/2017 -  Anti-estrogen oral therapy   Tamoxifen once daily starting in 05/2017       CURRENT THERAPY:  Tamoxifen 84m daily started 05/2017   INTERVAL HISTORY:  EEUTHA CUDEis here for a follow up of left breast cancer. She presents to the clinic alone. She notes she is doing well except some abdominal issues. She is getting GI work up done. Will have colonoscopy and endoscopy next month. She has abdominal swelling and very uncomfortable. This has been going on for 2 months. She was sent to bariatric surgeon. She had bariatric surgery in the past. Her thyroid replacement was recently increased. She notes she has nerve damage that is causing her HA. She noes several procedures have been tried but no success. She had been on choric pain medication. She gained more than 20 pounds since her last visit. She has adequate appetite and is working on changing her diet. She notes she has been stressed and depressed as well due to her husband doing truck driving.    REVIEW OF SYSTEMS:   Constitutional: Denies fevers, chills or abnormal weight loss (+) 20 pound weight gain  Eyes: Denies blurriness of vision Ears, nose, mouth, throat, and face: Denies mucositis or sore throat Respiratory: Denies cough, dyspnea or wheezes Cardiovascular: Denies palpitation, chest discomfort or lower extremity swelling Gastrointestinal:  Denies nausea, heartburn or change in bowel habits (+)abdominal distention and discomfort.  Skin: Denies abnormal skin rashes Lymphatics: Denies new lymphadenopathy or  easy bruising Neurological:Denies numbness, tingling or new weaknesses (+) HA from nerve damage  Behavioral/Psych: (+) stressed and depressed  All other systems were reviewed with the patient and are negative.  MEDICAL HISTORY:  Past Medical History:  Diagnosis Date  . Adverse effect of anesthetic 01/2012   difficulty waking up.  . Adverse effect of unspecified anesthetic, initial encounter 08/2011   low o2 sats.  . Anemia   . Anxiety   . Arthritis   . [place] 02/16[place]2017   left breast  . Chronic pain   . Closed supracondylar fracture of right femur ([facility] 09/18/2012  . Depression   . Family history of breast cancer   . GERD (gastroesophageal reflux disease)   . Headache(784.0)   . Hypothyroidism   . Opioid dependence in controlled environment ([place]   . Osteoporosis   . Osteoporosis with fracture 09/19/2012  . Thyroid disease     SURGICAL HISTORY: Past Surgical History:  Procedure Laterality Date  . BREAST BIOPSY Left 02/16/2017  . BREAST LUMPECTOMY WITH RADIOACTIVE SEED AND SENTINEL LYMPH NODE BIOPSY Left 03/15/2017   Procedure: LEFT BREAST LUMPECTOMY WITH RADIOACTIVE SEED AND LEFT AXILLARY SENTINEL LYMPH NODE BIOPSY;  Surgeon: NAlphonsa Overall MD;  [facility]  Service: General;  Laterality: Left;  . CHOLECYSTECTOMY  10/1975  . FEMUR IM NAIL  09/18/2012   Procedure: INTRAMEDULLARY (IM) RETROGRADE FEMORAL  NAILING;  Surgeon: [name], MD;  Location: [place];  Service: Orthopedics;  Laterality: Right;  . GASTRIC BYPASS  2003  . JOINT REPLACEMENT    . REPLACEMENT TOTAL KNEE  2003   right and left -4 months aoart.  . TONSILLECTOMY    . WRIST FRACTURE SURGERY  08/2011    I have reviewed the social history and family history with the patient and they are unchanged from previous note.  ALLERGIES:  is allergic to nsaids; ibuprofen; and tolmetin.  MEDICATIONS:  Current Outpatient Medications  Medication Sig Dispense Refill  . AIMOVIG  140 MG/ML SOAJ Inject 140 mg as directed every 30 (thirty) days.    . B Complex Vitamins (B COMPLEX 100 PO) Take 1 tablet by mouth daily.    . Biotin 1000 MCG tablet Take 5,000 mcg by mouth daily.     . Calcium Carbonate (CALCIUM 600 PO) Take 1 tablet by mouth daily.     . carbamazepine (TEGRETOL XR) 100 MG 12 hr tablet Take by mouth 3 (three) times daily.     . cholecalciferol (VITAMIN D) 400 units TABS tablet Take 400 Units by mouth daily.    . ferrous sulfate 325 (65 FE) MG EC tablet Take 325 mg by mouth 3 (three) times daily with meals.    . gabapentin (NEURONTIN) 800 MG tablet Take 800 mg by mouth 4 (four) times daily.     . hydrOXYzine (ATARAX/VISTARIL) 50 MG tablet Take 50 mg by mouth daily as needed.   0  . latanoprost (XALATAN) 0.005 % ophthalmic solution Place 1 drop into both eyes daily. At night    . levothyroxine (SYNTHROID, LEVOTHROID) 50 MCG tablet Take 75 mcg by mouth daily.     Marland Kitchen liothyronine (CYTOMEL) 25 MCG tablet Take 25 mcg by mouth daily.    . Multiple Vitamins-Iron (MULTIVITAMIN/IRON PO) Take 1 tablet by mouth daily.     Marland Kitchen omeprazole (PRILOSEC) 40 MG capsule Take 40 mg by mouth daily.    Marland Kitchen oxyCODONE-acetaminophen (PERCOCET) 10-325 MG tablet Take 1 tablet by mouth every 6 (six) hours as needed for pain.    . potassium chloride (K-DUR,KLOR-CON) 10 MEQ tablet Take 10 mEq by mouth daily.    . QUEtiapine (SEROQUEL XR) 300 MG 24 hr tablet Take 300 mg by mouth daily.    . tamoxifen (NOLVADEX) 20 MG tablet TAKE 1 TABLET BY MOUTH  DAILY 90 tablet 3  . tizanidine (ZANAFLEX) 6 MG capsule Take 6 mg by mouth 3 (three) times daily.    . traZODone (DESYREL) 100 MG tablet Take 150 mg by mouth at bedtime.     . vortioxetine HBr (TRINTELLIX) 20 MG TABS tablet Take 20 mg by mouth daily.     No current facility-administered medications for this visit.     PHYSICAL EXAMINATION: ECOG PERFORMANCE STATUS: 0 - Asymptomatic  Vitals:   06/18/19 1057  BP: 124/77  Pulse: 76  Resp: 18   Temp: 98.9 F (37.2 C)  SpO2: 95%   Filed Weights   06/18/19 1057  Weight: 218 lb (98.9 kg)    GENERAL:alert, no distress and comfortable SKIN: skin color, texture, turgor are normal, no rashes or significant lesions EYES: normal, Conjunctiva are pink and non-injected, sclera clear  NECK: supple, thyroid normal size, non-tender, without nodularity LYMPH:  no palpable lymphadenopathy in the cervical, axillary  LUNGS: clear to auscultation and percussion with normal breathing effort HEART: regular rate & rhythm and no murmurs and no lower extremity edema ABDOMEN:abdomen  soft, non-tender and normal bowel sounds (+) abdominal distention (+) Mild RUQ tenderness, no hepatomegaly  Musculoskeletal:no cyanosis of digits and no clubbing  NEURO: alert & oriented x 3 with fluent speech, no focal motor/sensory deficits BREAST: S/p left lumpectomy: surgical incision healed well with mild scar tissue (+) Mild lateral left breast lymphedema. No palpable mass, nodules bilaterally. Breast exam benign.   LABORATORY DATA:  I have reviewed the data as listed CBC Latest Ref Rng & Units 06/18/2019 08/15/2018 01/31/2018  WBC 4.0 - 10.5 K/uL 5.0 4.0 5.0  Hemoglobin 12.0 - 15.0 g/dL 12.0 12.6 11.9  Hematocrit 36.0 - 46.0 % 36.9 38.7 36.0  Platelets 150 - 400 K/uL 247 238 271     CMP Latest Ref Rng & Units 06/18/2019 08/15/2018 01/31/2018  Glucose 70 - 99 mg/dL 99 92 79  BUN 8 - 23 mg/dL '21 16 18  ' Creatinine 0.44 - 1.00 mg/dL 0.80 0.83 0.90  Sodium 135 - 145 mmol/L 136 141 142  Potassium 3.5 - 5.1 mmol/L 4.6 5.0 5.0  Chloride 98 - 111 mmol/L 101 103 106  CO2 22 - 32 mmol/L 27 30 31(H)  Calcium 8.9 - 10.3 mg/dL 8.7(L) 9.3 9.2  Total Protein 6.5 - 8.1 g/dL 6.8 6.7 6.5  Total Bilirubin 0.3 - 1.2 mg/dL 0.4 0.4 0.3  Alkaline Phos 38 - 126 U/L 80 55 62  AST 15 - 41 U/L 29 32 27  ALT 0 - 44 U/L '27 22 21      ' RADIOGRAPHIC STUDIES: I have personally reviewed the radiological images as listed and agreed  with the findings in the report. No results found.   ASSESSMENT & PLAN:  ALVINE MOSTAFA is a 68 y.o. female with   1. Malignant neoplasm of upper-outer quadrant of left breast in female, estrogen receptor positive,pT1bN0M0, Stage 1A, ER/PR Positive, Her2 negative, Grade 1 -She was diagnosed in 01/2017. She is s/p left breast lumpectomyand SLN biopsy and adjuvant radiation -She is currently onadjuvant Tamoxifen since 05/2017, and is tolerating well.  -She is clinically doing well. Lab reviewed, her CBC and CMP are within normal limits. Her breast exam and her 08/2018 mammogram were unremarkable. There is no high clinical concern for recurrence. -She did have abdominal distention and RUQ tenderness on exam today. I have low suspicion this is related to her breast cancer. Will obtain US liver for further evaluation. She is agreeable.  -Continue surveillance. Next mammogram in 08/2019 -Continue Tamoxifen  -F/u in 4 months   2. Osteopenia -DEXA scan in 04/2018 revealed T score of -1.7 at left femur  -she is on calcium and Vit D.   3. Depression and Anxiety -On Effexor. F/u with PCP -Has been more stressed and depressed lately due to her husband being away with truck driving job.   4. Chronic HA -from a previous RTA, nerve damage  -On pain management  -f/u with PCP  5. Thyroid dysfunction -on levothyroxine   6. Obesity, s/p bariatric surgery  -s/p Roux-en-bypass gastric surgery  -She has gained 20 pounds since last visit. I encouraged her increase her activity level and monitor her diet.  -She will continue to f/u with her PCP and bariatric surgeon.   6. Abdominal distention and discomfort  -Ongoing for over 2 months  -She is undergoing GI workup, plans to have colonoscopy and endoscopy in 06/2019 -No ascites on exam but does have RUQ tenderness, no hepatomegaly. (06/18/19) -I recommend she do liver US due to RUQ tenderness. She is agreeable.  Plan -abdominal US in 1-2  weeks  -Lab and f/u in 4 months  -Mammogram in 08/2019 -continue Tamoxifen   No problem-specific Assessment & Plan notes found for this encounter.   Orders Placed This Encounter  Procedures  . US Abdomen Complete    Standing Status:   Future    Standing Expiration Date:   06/17/2020    Order Specific Question:   Reason for Exam (SYMPTOM  OR DIAGNOSIS REQUIRED)    Answer:   abdominal bloating and weight gain, evaluate liver, rule out liver lesion, rule out ascites    Order Specific Question:   Preferred imaging location?    Answer:   [facility]   All questions were answered. The patient knows to call the clinic with any problems, questions or concerns. No barriers to learning was detected. I spent 20 minutes counseling the patient face to face. The total time spent in the appointment was 25 minutes and more than 50% was on counseling and review of test results     Truitt Merle, MD 06/18/2019   I, Joslyn Devon, am acting as scribe for Truitt Merle, MD.   I have reviewed the above documentation for accuracy and completeness, and I agree with the above.

## 2019-06-18 ENCOUNTER — Telehealth: Payer: Self-pay | Admitting: Hematology

## 2019-06-18 ENCOUNTER — Other Ambulatory Visit: Payer: Self-pay

## 2019-06-18 ENCOUNTER — Inpatient Hospital Stay (HOSPITAL_BASED_OUTPATIENT_CLINIC_OR_DEPARTMENT_OTHER): Payer: Medicare Other | Admitting: Hematology

## 2019-06-18 ENCOUNTER — Encounter: Payer: Self-pay | Admitting: Hematology

## 2019-06-18 ENCOUNTER — Inpatient Hospital Stay: Payer: Medicare Other | Attending: Hematology

## 2019-06-18 VITALS — BP 124/77 | HR 76 | Temp 98.9°F | Resp 18 | Ht 61.0 in | Wt 218.0 lb

## 2019-06-18 DIAGNOSIS — M8580 Other specified disorders of bone density and structure, unspecified site: Secondary | ICD-10-CM | POA: Diagnosis not present

## 2019-06-18 DIAGNOSIS — Z79811 Long term (current) use of aromatase inhibitors: Secondary | ICD-10-CM | POA: Diagnosis not present

## 2019-06-18 DIAGNOSIS — Z170 Estrogen receptor positive status [ER+]: Secondary | ICD-10-CM | POA: Diagnosis not present

## 2019-06-18 DIAGNOSIS — Z9884 Bariatric surgery status: Secondary | ICD-10-CM | POA: Insufficient documentation

## 2019-06-18 DIAGNOSIS — Z923 Personal history of irradiation: Secondary | ICD-10-CM | POA: Insufficient documentation

## 2019-06-18 DIAGNOSIS — R10811 Right upper quadrant abdominal tenderness: Secondary | ICD-10-CM | POA: Diagnosis not present

## 2019-06-18 DIAGNOSIS — F329 Major depressive disorder, single episode, unspecified: Secondary | ICD-10-CM | POA: Insufficient documentation

## 2019-06-18 DIAGNOSIS — C50412 Malignant neoplasm of upper-outer quadrant of left female breast: Secondary | ICD-10-CM | POA: Diagnosis not present

## 2019-06-18 DIAGNOSIS — R1900 Intra-abdominal and pelvic swelling, mass and lump, unspecified site: Secondary | ICD-10-CM | POA: Diagnosis not present

## 2019-06-18 DIAGNOSIS — E669 Obesity, unspecified: Secondary | ICD-10-CM | POA: Insufficient documentation

## 2019-06-18 DIAGNOSIS — R51 Headache: Secondary | ICD-10-CM | POA: Insufficient documentation

## 2019-06-18 DIAGNOSIS — F419 Anxiety disorder, unspecified: Secondary | ICD-10-CM | POA: Insufficient documentation

## 2019-06-18 DIAGNOSIS — Z79899 Other long term (current) drug therapy: Secondary | ICD-10-CM | POA: Insufficient documentation

## 2019-06-18 DIAGNOSIS — K219 Gastro-esophageal reflux disease without esophagitis: Secondary | ICD-10-CM | POA: Insufficient documentation

## 2019-06-18 LAB — COMPREHENSIVE METABOLIC PANEL
ALT: 27 U/L (ref 0–44)
AST: 29 U/L (ref 15–41)
Albumin: 4 g/dL (ref 3.5–5.0)
Alkaline Phosphatase: 80 U/L (ref 38–126)
Anion gap: 8 (ref 5–15)
BUN: 21 mg/dL (ref 8–23)
CO2: 27 mmol/L (ref 22–32)
Calcium: 8.7 mg/dL — ABNORMAL LOW (ref 8.9–10.3)
Chloride: 101 mmol/L (ref 98–111)
Creatinine, Ser: 0.8 mg/dL (ref 0.44–1.00)
GFR calc Af Amer: >60 mL/min (ref >60)
GFR calc non Af Amer: >60 mL/min (ref >60)
Glucose, Bld: 99 mg/dL (ref 70–99)
Potassium: 4.6 mmol/L (ref 3.5–5.1)
Sodium: 136 mmol/L (ref 135–145)
Total Bilirubin: 0.4 mg/dL (ref 0.3–1.2)
Total Protein: 6.8 g/dL (ref 6.5–8.1)

## 2019-06-18 LAB — CBC WITH DIFFERENTIAL/PLATELET
Abs Immature Granulocytes: 0.01 10*3/uL (ref 0.00–0.07)
Basophils Absolute: 0 10*3/uL (ref 0.0–0.1)
Basophils Relative: 1 %
Eosinophils Absolute: 0.1 10*3/uL (ref 0.0–0.5)
Eosinophils Relative: 3 %
HCT: 36.9 % (ref 36.0–46.0)
Hemoglobin: 12 g/dL (ref 12.0–15.0)
Immature Granulocytes: 0 %
Lymphocytes Relative: 23 %
Lymphs Abs: 1.2 10*3/uL (ref 0.7–4.0)
MCH: 29.5 pg (ref 26.0–34.0)
MCHC: 32.5 g/dL (ref 30.0–36.0)
MCV: 90.7 fL (ref 80.0–100.0)
Monocytes Absolute: 0.6 10*3/uL (ref 0.1–1.0)
Monocytes Relative: 12 %
Neutro Abs: 3 10*3/uL (ref 1.7–7.7)
Neutrophils Relative %: 61 %
Platelets: 247 10*3/uL (ref 150–400)
RBC: 4.07 MIL/uL (ref 3.87–5.11)
RDW: 13.5 % (ref 11.5–15.5)
WBC: 5 10*3/uL (ref 4.0–10.5)
nRBC: 0 % (ref 0.0–0.2)

## 2019-06-18 NOTE — Telephone Encounter (Signed)
Gave patient avs report and appointments for January. Central radiology will call re [place].

## 2019-06-21 ENCOUNTER — Other Ambulatory Visit: Payer: Self-pay | Admitting: Hematology

## 2019-06-25 ENCOUNTER — Ambulatory Visit (HOSPITAL_BASED_OUTPATIENT_CLINIC_OR_DEPARTMENT_OTHER)
Admission: RE | Admit: 2019-06-25 | Discharge: 2019-06-25 | Disposition: A | Discharge status: Home / Self Care | Payer: Medicare Other | Source: Ambulatory Visit | Attending: Hematology | Admitting: Hematology

## 2019-06-25 ENCOUNTER — Other Ambulatory Visit: Payer: Self-pay

## 2019-06-25 DIAGNOSIS — C50412 Malignant neoplasm of upper-outer quadrant of left female breast: Secondary | ICD-10-CM | POA: Diagnosis not present

## 2019-06-25 DIAGNOSIS — Z170 Estrogen receptor positive status [ER+]: Secondary | ICD-10-CM | POA: Insufficient documentation

## 2019-08-31 ENCOUNTER — Telehealth: Payer: Self-pay

## 2019-08-31 NOTE — Telephone Encounter (Signed)
Faxed signed order for diagnostic mammogram to [place], sent to HIM for scan to chart.

## 2019-09-04 ENCOUNTER — Telehealth: Payer: Self-pay | Admitting: Hematology

## 2019-09-04 NOTE — Telephone Encounter (Signed)
YF Call day 1/25 lab/fu moved AM. confirmed with patient.

## 2019-10-10 NOTE — Progress Notes (Signed)
[place]   [phone]) [phone] [phone]) [phone]   Clinic Follow up Note   Patient Care Team: Rich Fuchs, PA as PCP - General (Physician Assistant) Kyung Rudd, MD as Consulting Physician (Radiation Oncology) Truitt Merle, MD as Consulting Physician (Hematology) Lorelle Gibbs, MD as Consulting Physician (Radiology) [name] Overall, MD as Consulting Physician (General Surgery) Delice Bison, Charlestine Massed, NP as Nurse Practitioner (Hematology and Oncology)  Date of Service:  10/15/2019  CHIEF COMPLAINT: F/u of left breast cancer  SUMMARY OF ONCOLOGIC HISTORY: Oncology History Overview Note  Cancer Staging Malignant neoplasm of upper-outer quadrant of left breast in female, estrogen receptor positive ([place]) Staging form: Breast, AJCC 8th Edition - Clinical: cT1b, cN0, cM0, G1, ER: Positive, PR: Positive - Unsigned - Pathologic stage from 03/15/2017: Stage IA (pT1b, pN0, cM0, G1, ER: Positive, PR: Positive, HER2: Negative) - Signed by Truitt Merle, MD on 03/19/2017     Malignant neoplasm of upper-outer quadrant of left breast in female, estrogen receptor positive ([place])  02/08/2017 Mammogram   Diagnostic mammogram Screening 02/08/17 IMPRESSION:  Addidiontal Imaging Evaluation needed The new oval mass in the left breast is indeterminate. 3D mediolateral, and spot copression views as well as an ultrasound are recommended   02/10/2017 Mammogram   Mammogram of the left breast 02/10/17 The 6 mm nodule in the left breast is indeterminate. An ultrasound is recommended.     02/10/2017 Imaging   [place] of Left Breast 02/10/17 The 6 mm lymph node with focal corytex thickening in the left breast is at a low suspicion for malignancy. An Ultrasound guided biopsy is recommended.    02/16/2017 Receptors her2   ER: 90% Positive PR: 80% Positive Ki67: 3% HER2: Negative   02/16/2017 Initial Biopsy   Diagnosis 02/16/17 Breast, left, needle core biopsy - INVASIVE DUCTAL  CARCINOMA - SEE COMMENT    03/11/2017 Initial Diagnosis   Malignant neoplasm of upper-outer quadrant of left breast in female, estrogen receptor positive ([place])   03/15/2017 Surgery   LEFT BREAST LUMPECTOMY WITH RADIOACTIVE SEED AND LEFT AXILLARY SENTINEL LYMPH NODE BIOPSY by Dr Lucia Gaskins    03/15/2017 Pathology Results   Diagnosis 03/15/17 1. 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 - RESECTION MARGINS ARE NEGATIVE. - BIOPSY SITE. - SCLEROSING ADENOSIS, FIBROCYSTIC CHANGE. - SEE ONCOLOGY TABLE. 2. Lymph node, sentinel, biopsy, Left axillary - ONE OF ONE LYMPH NODES NEGATIVE FOR CARCINOMA (0/1). 3. Lymph node, sentinel, biopsy, Left - ONE OF ONE LYMPH NODES NEGATIVE FOR CARCINOMA (0/1). 4. Lymph node, sentinel, biopsy, Left - ONE OF ONE LYMPH NODES NEGATIVE FOR CARCINOMA (0/1). 5. Lymph node, sentinel, biopsy, Left - ONE OF ONE LYMPH NODES NEGATIVE FOR CARCINOMA (0/1). 6. Lymph node, sentinel, biopsy, Left - ONE OF ONE LYMPH NODES NEGATIVE FOR CARCINOMA (0/1).    04/20/2017 Genetic Testing   Patient had genetic testing due to a family history of breast cancer and Ashkenazi Jewish ancestry.  She was tested for the [facility] Common Hereditary Cancers Panel.  The Hereditary Gene Panel offered by [facility] includes sequencing and/or deletion duplication testing of the following 46 genes: APC, ATM, AXIN2, BARD1, BMPR1A, BRCA1, BRCA2, BRIP1, CDH1, CDKN2A (p14ARF), CDKN2A (p16INK4a), CHEK2, CTNNA1, DICER1, EPCAM (Deletion/duplication testing only), GREM1 (promoter region deletion/duplication testing only), KIT, MEN1, MLH1, MSH2, MSH3, MSH6, MUTYH, NBN, NF1, NHTL1, PALB2, PDGFRA, PMS2, POLD1, POLE, PTEN, RAD50, RAD51C, RAD51D, SDHB, SDHC, SDHD, SMAD4, SMARCA4. STK11, TP53, TSC1, TSC2, and VHL.  The  following genes were evaluated for sequence changes only: SDHA and HOXB13 c.251G>A variant only.  Results: No pathogenic mutations  identified in the 46 genes analyzed.   The date of this test report is 04/20/2017.    04/25/2017 - 05/20/2017 Radiation Therapy   Adjuvant breast radiation, Dr. Lisbeth Renshaw   Radiation treatment dates:   04/25/2017 - 05/20/2017  Site/dose:  Left breast / 42.5 Gy in 17 fractions & boost / 7.5 Gy in 3 fractions   Beams/energy:   Photons/ 15X, 6X    Narrative: The patient tolerated radiation treatment relatively well.      05/2017 -  Anti-estrogen oral therapy   Tamoxifen once daily starting in 05/2017       CURRENT THERAPY:  Tamoxifen45m dailystarted 05/2017. Due to drug interaction with Tegretol will switch to Exemestane in 09/2019.   INTERVAL HISTORY:  EJANUARY BERGTHOLDis here for a follow up of left breast cancer. She presents to the clinic alone. She notes she has been stressed with her family lately. She notes her husband temporarily lost his job. She notes she had 3 day prep for her latest colonoscopy. She was found to have 1 benign polyp and she notes she has a sagging colon. She notes she has HA's which are tolerable now. She notes she in the middle of the night she accidentally ran into her door and the wall. Her HA are back to being severe again. She takes Tegretol which she has been on for 1 year. She notes she has chronic joint pain which is managed with medication. She is fine to take AI. She notes she has LE edema and plans to get recliner to sit more with her feet up.     REVIEW OF SYSTEMS:   Constitutional: Denies fevers, chills or abnormal weight loss (+) severe HAs Eyes: Denies blurriness of vision Ears, nose, mouth, throat, and face: Denies mucositis or sore throat Respiratory: Denies cough, dyspnea or wheezes Cardiovascular: Denies palpitation, chest discomfort (+) B/l lower extremity swelling Gastrointestinal:  Denies nausea, heartburn or change in bowel habits Skin: Denies abnormal skin rashes MSK: (+) Chronic joint pain, well controlled.  Lymphatics: Denies new  lymphadenopathy or easy bruising Neurological:Denies numbness, tingling or new weaknesses Behavioral/Psych: Mood is stable, no new changes  All other systems were reviewed with the patient and are negative.  MEDICAL HISTORY:  Past Medical History:  Diagnosis Date  . Adverse effect of anesthetic 01/2012   difficulty waking up.  . Adverse effect of unspecified anesthetic, initial encounter 08/2011   low o2 sats.  . Anemia   . Anxiety   . Arthritis   . Breast cancer ([place] 02/16/2017   left breast  . Chronic pain   . [place] supr[place]ylar [place] of right [place] 09/18/2012  . Depression   . Family history of breast cancer   . GERD (gastroesophageal reflux disease)   . Headache(784.0)   . Hypothyroidism   . Opioid dependence in controlled environment ([place]   . Osteoporosis   . Osteoporosis with fracture 09/19/2012  . Thyroid disease     SURGICAL HISTORY: Past Surgical History:  Procedure Laterality Date  . BREAST BIOPSY Left 02/16/2017  . BREAST LUMPECTOMY WITH RADIOACTIVE SEED AND SENTINEL LYMPH NODE BIOPSY Left 03/15/2017   Procedure: LEFT BREAST LUMPECTOMY WITH RADIOACTIVE SEED AND LEFT AXILLARY SENTINEL LYMPH NODE BIOPSY;  Surgeon: NAlphonsa Overall MD;  [facility]  Service: General;  Laterality: Left;  . CHOLECYSTECTOMY  10/1975  . FEMUR IM  NAIL  09/18/2012   Procedure: INTRAMEDULLARY (IM) RETROGRADE FEMORAL NAILING;  Surgeon: [name], MD;  Location: [place] [facility];  Service: Orthopedics;  Laterality: Right;  . GASTRIC BYPASS  2003  . JOINT REPLACEMENT    . REPLACEMENT TOTAL KNEE  2003   right and left -4 months aoart.  . TONSILLECTOMY    . WRIST FRACTURE SURGERY  08/2011    I have reviewed the social history and family history with the patient and they are unchanged from previous note.  ALLERGIES:  is allergic to nsaids; ibuprofen; and tolmetin.  MEDICATIONS:  Current Outpatient Medications  Medication Sig Dispense Refill  . AIMOVIG 140  MG/ML SOAJ Inject 140 mg as directed every 30 (thirty) days.    . B Complex Vitamins (B COMPLEX 100 PO) Take 1 tablet by mouth daily.    . Biotin 1000 MCG tablet Take 5,000 mcg by mouth daily.     . Calcium Carbonate (CALCIUM 600 PO) Take 1 tablet by mouth daily.     . cholecalciferol (VITAMIN D) 400 units TABS tablet Take 400 Units by mouth daily.    . ferrous sulfate 325 (65 FE) MG EC tablet Take 325 mg by mouth 3 (three) times daily with meals.    . gabapentin (NEURONTIN) 800 MG tablet Take 800 mg by mouth 4 (four) times daily.     . hydrOXYzine (ATARAX/VISTARIL) 50 MG tablet Take 50 mg by mouth daily as needed.   0  . latanoprost (XALATAN) 0.005 % ophthalmic solution Place 1 drop into both eyes daily. At night    . levothyroxine (SYNTHROID, LEVOTHROID) 50 MCG tablet Take 75 mcg by mouth daily.     Marland Kitchen linaclotide (LINZESS) 290 MCG CAPS capsule Take 290 mcg by mouth daily before breakfast.    . liothyronine (CYTOMEL) 25 MCG tablet Take 25 mcg by mouth daily.    . Multiple Vitamins-Iron (MULTIVITAMIN/IRON PO) Take 1 tablet by mouth daily.     Marland Kitchen omeprazole (PRILOSEC) 40 MG capsule Take 40 mg by mouth daily.    Marland Kitchen oxyCODONE-acetaminophen (PERCOCET) 10-325 MG tablet Take 1 tablet by mouth every 6 (six) hours as needed for pain.    . potassium chloride (K-DUR,KLOR-CON) 10 MEQ tablet Take 10 mEq by mouth daily.    . QUEtiapine (SEROQUEL XR) 300 MG 24 hr tablet Take 400 mg by mouth daily.     . tamoxifen (NOLVADEX) 20 MG tablet Take 1 tablet (20 mg total) by mouth daily. 90 tablet 3  . tizanidine (ZANAFLEX) 6 MG capsule Take 6 mg by mouth 3 (three) times daily.    Marland Kitchen vortioxetine HBr (TRINTELLIX) 20 MG TABS tablet Take 20 mg by mouth daily.    . carbamazepine (TEGRETOL XR) 100 MG 12 hr tablet Take by mouth 3 (three) times daily.     Marland Kitchen exemestane (AROMASIN) 25 MG tablet Take 1 tablet (25 mg total) by mouth daily after breakfast. 30 tablet 3   No current facility-administered medications for this visit.     PHYSICAL EXAMINATION: ECOG PERFORMANCE STATUS: 1 - Symptomatic but completely ambulatory  Vitals:   10/15/19 1153  BP: 129/87  Pulse: 62  Resp: 18  Temp: 97.8 F (36.6 C)  SpO2: 100%   Filed Weights   10/15/19 1153  Weight: 227 lb 11.2 oz (103.3 kg)    GENERAL:alert, no distress and comfortable SKIN: skin color, texture, turgor are normal, no rashes or significant lesions EYES: normal, Conjunctiva are pink and non-injected, sclera clear  NECK: supple, thyroid  normal size, non-tender, without nodularity LYMPH:  no palpable lymphadenopathy in the cervical, axillary  LUNGS: clear to auscultation and percussion with normal breathing effort HEART: regular rate & rhythm and no murmurs  (+) B/l lower extremity edema ABDOMEN:abdomen soft, non-tender and normal bowel sounds Musculoskeletal:no cyanosis of digits and no clubbing  NEURO: alert & oriented x 3 with fluent speech, no focal motor/sensory deficits BREAST: S/p left lumpectomy: Surgical incision healed well with scar tissue of Lateral left breast (+) Mild left breast lymphedema. No palpable mass, nodules bilaterally. Breast exam benign.   LABORATORY DATA:  I have reviewed the data as listed CBC Latest Ref Rng & Units 10/15/2019 06/18/2019 08/15/2018  WBC 4.0 - 10.5 K/uL 4.5 5.0 4.0  Hemoglobin 12.0 - 15.0 g/dL 12.2 12.0 12.6  Hematocrit 36.0 - 46.0 % 36.4 36.9 38.7  Platelets 150 - 400 K/uL 249 247 238     CMP Latest Ref Rng & Units 10/15/2019 06/18/2019 08/15/2018  Glucose 70 - 99 mg/dL 95 99 92  BUN 8 - 23 mg/dL '17 21 16  ' Creatinine 0.44 - 1.00 mg/dL 0.73 0.80 0.83  Sodium 135 - 145 mmol/L 133(L) 136 141  Potassium 3.5 - 5.1 mmol/L 5.3(H) 4.6 5.0  Chloride 98 - 111 mmol/L 97(L) 101 103  CO2 22 - 32 mmol/L '27 27 30  ' Calcium 8.9 - 10.3 mg/dL 8.6(L) 8.7(L) 9.3  Total Protein 6.5 - 8.1 g/dL 6.5 6.8 6.7  Total Bilirubin 0.3 - 1.2 mg/dL 0.3 0.4 0.4  Alkaline Phos 38 - 126 U/L 68 80 55  AST 15 - 41 U/L 28 29 32  ALT 0 -  44 U/L '27 27 22      ' RADIOGRAPHIC STUDIES: I have personally reviewed the radiological images as listed and agreed with the findings in the report. No results found.   ASSESSMENT & PLAN:  Deborah Cooke is a 70 y.o. female with    1. Malignant neoplasm of upper-outer quadrant of left breast in female, estrogen receptor positive,pT1bN0M0, Stage 1A, ER/PR Positive, Her2 negative, Grade 1 -She was diagnosed in 01/2017. She is s/pleft breast lumpectomyand SLN biopsy and adjuvant radiation -She is currently onadjuvant Tamoxifen since 05/2017, and is tolerating well.  -I discussed that Tamoxifen has interaction with carbamazepine (Tegretol) . Given worsened HA's she is not ready to stop Tegretol. I will switch her Tamoxifen to AI such as Exemestane or Anastrozole for another 5 years.   --The potential benefit and side effects, which includes but not limited to, hot flash, skin and vaginal dryness, metabolic changes ( increased blood glucose, cholesterol, weight, etc.), slightly in increased risk of cardiovascular disease, cataracts, muscular and joint discomfort, osteopenia and osteoporosis, etc, were discussed with her in great details. She is interested. Will start with Exemestane this month if no high copay.  -From a breast cancer standpoint she is clinically doing well. Lab reviewed, her CBC and CMP are within normal limits except K 5.3, Ca 8.6. Her 08/2019 mammogram were unremarkable. Her physical exam unremarkable except left breast scar tissue, lymphedema and LE edema.  There is no clinical concern for recurrence. -Given her elevated potassium she is fine to hold her oral daily potassium. She can restart or adjust if labs with PCP allow.  -Virtual visit in 3 month and OV in 6 months    2. Osteopenia -DEXA scan in 04/2018 revealed T score of -1.7 at left femur  -she is on calcium and Vit D.She is on Reclast infusions with her PCP. She  will continue as she will start AI.  -Repeat DEXA in  04/2020 with her PCP   3. Depression and Anxiety -On Effexor. F/u with PCP -Has been more stressed and depressed lately due to her husband being away with truck driving job.   4. 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  -On pain management. Continue to f/u with PCP -Her HA's did improve but after recent head injury her HA's have worsened.  -She has been on Tegretol for 1 year now. I discussed this has drug interaction with Tamoxifen. Given worsened HA's she is not ready to stop Tegretol. I will switch her Tamoxifen to AI.  -Will watch her joint pain on AI.   5. Thyroid dysfunction -on levothyroxine   6. Obesity, s/p bariatric surgery  -s/p Roux-en-bypass gastric surgery  -She has gained 20 pounds over 4 months of initial Quarantine. I encouraged her increase her activity level and monitor her diet.  -She will continue to f/u with her PCP and bariatric surgeon.    Plan -K at 5.3, will hold oral potassium, repeat BMP with her PCP next month  -Stop Tamoxifen due to drug interaction. I called in Exemestane to start this week  -Virtual visit in 3 months -Lab and F/u in 6 months, will order mammogram for her on next visit, due in Dec at [facility]    No problem-specific [place] notes found for this encounter.   No orders of the defined types were placed in this encounter.  All questions were answered. The patient knows to call the clinic with any problems, questions or concerns. No barriers to learning was detected. The total time spent in the appointment was 30 minutes.     Truitt Merle, MD 10/15/2019   I, Joslyn Devon, am acting as scribe for Truitt Merle, MD.   I have reviewed the above documentation for accuracy and completeness, and I agree with the above.

## 2019-10-11 ENCOUNTER — Other Ambulatory Visit: Payer: Self-pay

## 2019-10-11 DIAGNOSIS — C50412 Malignant neoplasm of upper-outer quadrant of left female breast: Secondary | ICD-10-CM

## 2019-10-11 DIAGNOSIS — Z170 Estrogen receptor positive status [ER+]: Secondary | ICD-10-CM

## 2019-10-11 MED ORDER — TAMOXIFEN CITRATE 20 MG PO TABS: 20.0000 mg | ORAL_TABLET | Freq: Every day | ORAL | 3 refills | Status: DC

## 2019-10-15 ENCOUNTER — Other Ambulatory Visit: Payer: Medicare Other

## 2019-10-15 ENCOUNTER — Encounter: Payer: Self-pay | Admitting: Hematology

## 2019-10-15 ENCOUNTER — Inpatient Hospital Stay: Payer: Medicare Other | Attending: Hematology

## 2019-10-15 ENCOUNTER — Inpatient Hospital Stay: Payer: Medicare Other | Admitting: Hematology

## 2019-10-15 ENCOUNTER — Ambulatory Visit: Payer: Medicare Other | Admitting: Hematology

## 2019-10-15 ENCOUNTER — Other Ambulatory Visit: Payer: Self-pay

## 2019-10-15 VITALS — BP 129/87 | HR 62 | Temp 97.8°F | Resp 18 | Wt 227.7 lb

## 2019-10-15 DIAGNOSIS — Z79811 Long term (current) use of aromatase inhibitors: Secondary | ICD-10-CM | POA: Insufficient documentation

## 2019-10-15 DIAGNOSIS — Z79899 Other long term (current) drug therapy: Secondary | ICD-10-CM | POA: Insufficient documentation

## 2019-10-15 DIAGNOSIS — C50412 Malignant neoplasm of upper-outer quadrant of left female breast: Secondary | ICD-10-CM | POA: Diagnosis not present

## 2019-10-15 DIAGNOSIS — Z8349 Family history of other endocrine, nutritional and metabolic diseases: Secondary | ICD-10-CM | POA: Diagnosis not present

## 2019-10-15 DIAGNOSIS — Z170 Estrogen receptor positive status [ER+]: Secondary | ICD-10-CM | POA: Insufficient documentation

## 2019-10-15 DIAGNOSIS — G8929 Other chronic pain: Secondary | ICD-10-CM | POA: Insufficient documentation

## 2019-10-15 DIAGNOSIS — Z8262 Family history of osteoporosis: Secondary | ICD-10-CM | POA: Diagnosis not present

## 2019-10-15 DIAGNOSIS — R600 Localized edema: Secondary | ICD-10-CM | POA: Insufficient documentation

## 2019-10-15 DIAGNOSIS — Z923 Personal history of irradiation: Secondary | ICD-10-CM | POA: Diagnosis not present

## 2019-10-15 DIAGNOSIS — F419 Anxiety disorder, unspecified: Secondary | ICD-10-CM | POA: Diagnosis not present

## 2019-10-15 DIAGNOSIS — M1990 Unspecified osteoarthritis, unspecified site: Secondary | ICD-10-CM | POA: Diagnosis not present

## 2019-10-15 DIAGNOSIS — F329 Major depressive disorder, single episode, unspecified: Secondary | ICD-10-CM | POA: Diagnosis not present

## 2019-10-15 DIAGNOSIS — K219 Gastro-esophageal reflux disease without esophagitis: Secondary | ICD-10-CM | POA: Diagnosis not present

## 2019-10-15 DIAGNOSIS — I890 Lymphedema, not elsewhere classified: Secondary | ICD-10-CM | POA: Diagnosis not present

## 2019-10-15 DIAGNOSIS — Z9884 Bariatric surgery status: Secondary | ICD-10-CM | POA: Insufficient documentation

## 2019-10-15 DIAGNOSIS — M8580 Other specified disorders of bone density and structure, unspecified site: Secondary | ICD-10-CM | POA: Diagnosis not present

## 2019-10-15 LAB — COMPREHENSIVE METABOLIC PANEL
ALT: 27 U/L (ref 0–44)
AST: 28 U/L (ref 15–41)
Albumin: 3.8 g/dL (ref 3.5–5.0)
Alkaline Phosphatase: 68 U/L (ref 38–126)
Anion gap: 9 (ref 5–15)
BUN: 17 mg/dL (ref 8–23)
CO2: 27 mmol/L (ref 22–32)
Calcium: 8.6 mg/dL — ABNORMAL LOW (ref 8.9–10.3)
Chloride: 97 mmol/L — ABNORMAL LOW (ref 98–111)
Creatinine, Ser: 0.73 mg/dL (ref 0.44–1.00)
GFR calc Af Amer: >60 mL/min (ref >60)
GFR calc non Af Amer: >60 mL/min (ref >60)
Glucose, Bld: 95 mg/dL (ref 70–99)
Potassium: 5.3 mmol/L — ABNORMAL HIGH (ref 3.5–5.1)
Sodium: 133 mmol/L — ABNORMAL LOW (ref 135–145)
Total Bilirubin: 0.3 mg/dL (ref 0.3–1.2)
Total Protein: 6.5 g/dL (ref 6.5–8.1)

## 2019-10-15 LAB — CBC WITH DIFFERENTIAL/PLATELET
Abs Immature Granulocytes: 0.02 10*3/uL (ref 0.00–0.07)
Basophils Absolute: 0 10*3/uL (ref 0.0–0.1)
Basophils Relative: 0 %
Eosinophils Absolute: 0.1 10*3/uL (ref 0.0–0.5)
Eosinophils Relative: 2 %
HCT: 36.4 % (ref 36.0–46.0)
Hemoglobin: 12.2 g/dL (ref 12.0–15.0)
Immature Granulocytes: 0 %
Lymphocytes Relative: 28 %
Lymphs Abs: 1.3 10*3/uL (ref 0.7–4.0)
MCH: 30.2 pg (ref 26.0–34.0)
MCHC: 33.5 g/dL (ref 30.0–36.0)
MCV: 90.1 fL (ref 80.0–100.0)
Monocytes Absolute: 0.6 10*3/uL (ref 0.1–1.0)
Monocytes Relative: 14 %
Neutro Abs: 2.5 10*3/uL (ref 1.7–7.7)
Neutrophils Relative %: 56 %
Platelets: 249 10*3/uL (ref 150–400)
RBC: 4.04 MIL/uL (ref 3.87–5.11)
RDW: 13.2 % (ref 11.5–15.5)
WBC: 4.5 10*3/uL (ref 4.0–10.5)
nRBC: 0 % (ref 0.0–0.2)

## 2019-10-15 MED ORDER — EXEMESTANE 25 MG PO TABS: 25.0000 mg | ORAL_TABLET | Freq: Every day | ORAL | 3 refills | Status: DC

## 2019-10-16 ENCOUNTER — Other Ambulatory Visit: Payer: Self-pay

## 2019-10-16 ENCOUNTER — Telehealth: Payer: Self-pay

## 2019-10-16 ENCOUNTER — Telehealth: Payer: Self-pay | Admitting: Hematology

## 2019-10-16 DIAGNOSIS — C50412 Malignant neoplasm of upper-outer quadrant of left female breast: Secondary | ICD-10-CM

## 2019-10-16 MED ORDER — ANASTROZOLE 1 MG PO TABS: 1.0000 mg | ORAL_TABLET | Freq: Every day | ORAL | 3 refills | Status: DC

## 2019-10-16 NOTE — Telephone Encounter (Signed)
Deborah Cooke left vm stating that she can not afford the copay for Exemestane.  She request rx for other medication you suggested.

## 2019-10-16 NOTE — Telephone Encounter (Signed)
Please call in anastrozole 1mg  daily for #30 with 3 refills, thanks   Truitt Merle MD

## 2019-10-16 NOTE — Telephone Encounter (Signed)
Scheduled appt per 1/25 los.  Spoke with pt and she is aware of her scheduled appt dates and time.

## 2019-11-08 ENCOUNTER — Other Ambulatory Visit: Payer: Self-pay | Admitting: Nurse Practitioner

## 2020-01-09 NOTE — Progress Notes (Signed)
[place]   [phone] [phone]   Clinic Follow up Note   Patient Care Team: Rich Fuchs, PA as PCP - General (Physician Assistant) Kyung Rudd, MD as Consulting Physician (Radiation Oncology) Truitt Merle, MD as Consulting Physician (Hematology) Lorelle Gibbs, MD as Consulting Physician (Radiology) [name] Overall, MD as Consulting Physician (General Surgery) Delice Bison Charlestine Massed, NP as Nurse Practitioner (Hematology and Oncology)   I connected with Deborah Cooke on 01/14/2020 at 11:20 AM EDT by telephone visit and verified that I am speaking with the correct person using two identifiers.  I discussed the limitations, risks, security and privacy concerns of performing an evaluation and management service by telephone and the availability of in person appointments. I also discussed with the patient that there may be a patient responsible charge related to this service. The patient expressed understanding and agreed to proceed.   Other persons participating in the visit and their role in the encounter:  None   Patient's location:  Her home  Provider's location:  My Office   CHIEF COMPLAINT: F/u of left breast cancer  SUMMARY OF ONCOLOGIC HISTORY: Oncology History Overview Note  Cancer Staging Malignant neoplasm of upper-outer quadrant of left breast in female, estrogen receptor positive ([place]) Staging form: Breast, AJCC 8th Edition - Clinical: cT1b, cN0, cM0, G1, ER: Positive, PR: Positive - Unsigned - Pathologic stage from 03/15/2017: Stage IA (pT1b, pN0, cM0, G1, ER: Positive, PR: Positive, HER2: Negative) - Signed by Truitt Merle, MD on 03/19/2017     Malignant neoplasm of upper-outer quadrant of left breast in female, estrogen receptor positive ([place])  02/08/2017 Mammogram   Diagnostic mammogram Screening 02/08/17 IMPRESSION:  Addidiontal Imaging Evaluation needed The new oval mass in the left breast is indeterminate. 3D  mediolateral, and spot copression views as well as an ultrasound are recommended   02/10/2017 Mammogram   Mammogram of the left breast 02/10/17 The 6 mm nodule in the left breast is indeterminate. An ultrasound is recommended.     02/10/2017 Imaging   [place] of Left Breast 02/10/17 The 6 mm lymph node with focal corytex thickening in the left breast is at a low suspicion for malignancy. An Ultrasound guided biopsy is recommended.    02/16/2017 Receptors her2   ER: 90% Positive PR: 80% Positive Ki67: 3% HER2: Negative   02/16/2017 Initial Biopsy   Diagnosis 02/16/17 Breast, left, needle core biopsy - INVASIVE DUCTAL CARCINOMA - SEE COMMENT    03/11/2017 Initial Diagnosis   Malignant neoplasm of upper-outer quadrant of left breast in female, estrogen receptor positive ([place])   03/15/2017 Surgery   LEFT BREAST LUMPECTOMY WITH RADIOACTIVE SEED AND LEFT AXILLARY SENTINEL LYMPH NODE BIOPSY by Dr Lucia Gaskins    03/15/2017 Pathology Results   Diagnosis 03/15/17 1. 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 - RESECTION MARGINS ARE NEGATIVE. - BIOPSY SITE. - SCLEROSING ADENOSIS, FIBROCYSTIC CHANGE. - SEE ONCOLOGY TABLE. 2. Lymph node, sentinel, biopsy, Left axillary - ONE OF ONE LYMPH NODES NEGATIVE FOR CARCINOMA (0/1). 3. Lymph node, sentinel, biopsy, Left - ONE OF ONE LYMPH NODES NEGATIVE FOR CARCINOMA (0/1). 4. Lymph node, sentinel, biopsy, Left - ONE OF ONE LYMPH NODES NEGATIVE FOR CARCINOMA (0/1). 5. Lymph node, sentinel, biopsy, Left - ONE OF ONE LYMPH NODES NEGATIVE FOR CARCINOMA (0/1). 6. Lymph node, sentinel, biopsy, Left - ONE OF ONE LYMPH NODES NEGATIVE FOR CARCINOMA (0/1).    04/20/2017 Genetic Testing  Patient had genetic testing due to a family history of breast cancer and Ashkenazi Jewish ancestry.  She was tested for the [facility] Common Hereditary Cancers Panel.  The Hereditary Gene Panel offered by  [facility] includes sequencing and/or deletion duplication testing of the following 46 genes: APC, ATM, AXIN2, BARD1, BMPR1A, BRCA1, BRCA2, BRIP1, CDH1, CDKN2A (p14ARF), CDKN2A (p16INK4a), CHEK2, CTNNA1, DICER1, EPCAM (Deletion/duplication testing only), GREM1 (promoter region deletion/duplication testing only), KIT, MEN1, MLH1, MSH2, MSH3, MSH6, MUTYH, NBN, NF1, NHTL1, PALB2, PDGFRA, PMS2, POLD1, POLE, PTEN, RAD50, RAD51C, RAD51D, SDHB, SDHC, SDHD, SMAD4, SMARCA4. STK11, TP53, TSC1, TSC2, and VHL.  The following genes were evaluated for sequence changes only: SDHA and HOXB13 c.251G>A variant only.  Results: No pathogenic mutations identified in the 46 genes analyzed.   The date of this test report is 04/20/2017.    04/25/2017 - 05/20/2017 Radiation Therapy   Adjuvant breast radiation, Dr. Lisbeth Renshaw   Radiation treatment dates:   04/25/2017 - 05/20/2017  Site/dose:  Left breast / 42.5 Gy in 17 fractions & boost / 7.5 Gy in 3 fractions   Beams/energy:   Photons/ 15X, 6X    Narrative: The patient tolerated radiation treatment relatively well.      05/2017 -  Anti-estrogen oral therapy   Tamoxifen '20mg'$  daily started 05/2017. Due to drug interaction with Tegretol will switch to anastrozole '1mg'$  in 09/2019.       CURRENT THERAPY:  Tamoxifen'20mg'$  dailystarted9/2018. Due to drug interaction with Tegretol will switch to anastrozole '1mg'$  in 09/2019.   INTERVAL HISTORY:  Deborah Cooke is here for a follow up of left breast cancer. They identified themselves by birth date. She notes she is doing well. She notes she is on Anastrozole now. She has not hot flashes currently and stable known joint pain. Compared to Tamoxifen she does not feel any different. She notes she just saw her PCP 3 days ago. She notes she was placed on oral potassium.    REVIEW OF SYSTEMS:   Constitutional: Denies fevers, chills or abnormal weight loss Eyes: Denies blurriness of vision Ears, nose, mouth, throat, and face: Denies  mucositis or sore throat Respiratory: Denies cough, dyspnea or wheezes Cardiovascular: Denies palpitation, chest discomfort or lower extremity swelling Gastrointestinal:  Denies nausea, heartburn or change in bowel habits Skin: Denies abnormal skin rashes Lymphatics: Denies new lymphadenopathy or easy bruising Neurological:Denies numbness, tingling or new weaknesses Behavioral/Psych: Mood is stable, no new changes  All other systems were reviewed with the patient and are negative.  MEDICAL HISTORY:  Past Medical History:  Diagnosis Date  . Adverse effect of anesthetic 01/2012   difficulty waking up.  . Adverse effect of unspecified anesthetic, initial encounter 08/2011   low o2 sats.  . Anemia   . Anxiety   . Arthritis   . Breast cancer ([place]) 02/16/2017   left breast  . Chronic pain   . Closed supracondylar fracture of right femur ([place]) 09/18/2012  . Depression   . Family history of breast cancer   . GERD (gastroesophageal reflux disease)   . Headache(784.0)   . Hypothyroidism   . Opioid dependence in controlled environment ([place])   . Osteoporosis   . Osteoporosis with fracture 09/19/2012  . Thyroid disease     SURGICAL HISTORY: Past Surgical History:  Procedure Laterality Date  . BREAST BIOPSY Left 02/16/2017  . BREAST LUMPECTOMY WITH RADIOACTIVE SEED AND SENTINEL LYMPH NODE BIOPSY Left 03/15/2017   Procedure: LEFT BREAST LUMPECTOMY WITH RADIOACTIVE SEED AND LEFT AXILLARY SENTINEL LYMPH NODE  BIOPSY;  Surgeon: [name] Overall, MD;  Location: [facility];  Service: General;  Laterality: Left;  . CHOLECYSTECTOMY  10/1975  . FEMUR IM NAIL  09/18/2012   Procedure: INTRAMEDULLARY (IM) RETROGRADE FEMORAL NAILING;  Surgeon: [name], MD;  Location: [facility];  Service: Orthopedics;  Laterality: Right;  . GASTRIC BYPASS  2003  . JOINT REPLACEMENT    . REPLACEMENT TOTAL KNEE  2003   right and left -4 months aoart.  . TONSILLECTOMY    . WRIST FRACTURE SURGERY   08/2011    I have reviewed the social history and family history with the patient and they are unchanged from previous note.  ALLERGIES:  is allergic to nsaids; ibuprofen; and tolmetin.  MEDICATIONS:  Current Outpatient Medications  Medication Sig Dispense Refill  . AIMOVIG 140 MG/ML SOAJ Inject 140 mg as directed every 30 (thirty) days.    Marland Kitchen anastrozole (ARIMIDEX) 1 MG tablet Take 1 tablet (1 mg total) by mouth daily. 90 tablet 2  . B Complex Vitamins (B COMPLEX 100 PO) Take 1 tablet by mouth daily.    . Biotin 1000 MCG tablet Take 5,000 mcg by mouth daily.     . Calcium Carbonate (CALCIUM 600 PO) Take 1 tablet by mouth daily.     . carbamazepine (TEGRETOL XR) 100 MG 12 hr tablet Take by mouth 3 (three) times daily.     . cholecalciferol (VITAMIN D) 400 units TABS tablet Take 400 Units by mouth daily.    . ferrous sulfate 325 (65 FE) MG EC tablet Take 325 mg by mouth 3 (three) times daily with meals.    . gabapentin (NEURONTIN) 800 MG tablet Take 800 mg by mouth 4 (four) times daily.     . hydrOXYzine (ATARAX/VISTARIL) 50 MG tablet Take 50 mg by mouth daily as needed.   0  . latanoprost (XALATAN) 0.005 % ophthalmic solution Place 1 drop into both eyes daily. At night    . levothyroxine (SYNTHROID, LEVOTHROID) 50 MCG tablet Take 75 mcg by mouth daily.     Marland Kitchen linaclotide (LINZESS) 290 MCG CAPS capsule Take 290 mcg by mouth daily before breakfast.    . liothyronine (CYTOMEL) 25 MCG tablet Take 25 mcg by mouth daily.    . Multiple Vitamins-Iron (MULTIVITAMIN/IRON PO) Take 1 tablet by mouth daily.     Marland Kitchen omeprazole (PRILOSEC) 40 MG capsule Take 40 mg by mouth daily.    Marland Kitchen oxyCODONE-acetaminophen (PERCOCET) 10-325 MG tablet Take 1 tablet by mouth every 6 (six) hours as needed for pain.    . potassium chloride (K-DUR,KLOR-CON) 10 MEQ tablet Take 10 mEq by mouth daily.    . QUEtiapine (SEROQUEL XR) 300 MG 24 hr tablet Take 400 mg by mouth daily.     . tizanidine (ZANAFLEX) 6 MG capsule Take 6 mg  by mouth 3 (three) times daily.    Marland Kitchen vortioxetine HBr (TRINTELLIX) 20 MG TABS tablet Take 20 mg by mouth daily.     No current facility-administered medications for this visit.    PHYSICAL EXAMINATION: ECOG PERFORMANCE STATUS: 0 - Asymptomatic  No vitals taken today, Exam not performed today   LABORATORY DATA:  I have reviewed the data as listed CBC Latest Ref Rng & Units 10/15/2019 06/18/2019 08/15/2018  WBC 4.0 - 10.5 K/uL 4.5 5.0 4.0  Hemoglobin 12.0 - 15.0 g/dL 12.2 12.0 12.6  Hematocrit 36.0 - 46.0 % 36.4 36.9 38.7  Platelets 150 - 400 K/uL 249 247 238  CMP Latest Ref Rng & Units 10/15/2019 06/18/2019 08/15/2018  Glucose 70 - 99 mg/dL 95 99 92  BUN 8 - 23 mg/dL _0 Creatinine 0.44 - 1.00 mg/dL 0.73 0.80 0.83  Sodium 135 - 145 mmol/L 133(L) 136 141  Potassium 3.5 - 5.1 mmol/L 5.3(H) 4.6 5.0  Chloride 98 - 111 mmol/L 97(L) 101 103  CO2 22 - 32 mmol/L _1 Calcium 8.9 - 10.3 mg/dL 8.6(L) 8.7(L) 9.3  Total Protein 6.5 - 8.1 g/dL 6.5 6.8 6.7  Total Bilirubin 0.3 - 1.2 mg/dL 0.3 0.4 0.4  Alkaline Phos 38 - 126 U/L 68 80 55  AST 15 - 41 U/L 28 29 32  ALT 0 - 44 U/L _2 RADIOGRAPHIC STUDIES: I have personally reviewed the radiological images as listed and agreed with the findings in the report. No results found.   ASSESSMENT & PLAN:  Deborah Cooke is a 69 y.o. female with    1. Malignant neoplasm of upper-outer quadrant of left breast in female, estrogen receptor positive,pT1bN0M0, Stage 1A, ER/PR Positive, Her2 negative, Grade 1 -She was diagnosed in 01/2017. She is s/pleft breast lumpectomyand SLN biopsy and adjuvant radiation -She started adjuvant Tamoxifenin 05/2017.Due to drug interaction with carbamazepine (Tegretol) I switched her to Anastrozole in 09/2019. She had high copay for Exemestane so she could not take it.  -She is clinically doing well. She is tolerating Anastrozole well. She has no concerns.  -Continue surveillance. Next  mammogram in 08/2020 at [facility].  -Continue Anastrozole.  -F/u with NP Lacie in 03/2020.    2. Osteopenia -DEXA scan in8/2019 revealed T score of -1.7 at left femur  -She is on calcium and Vit D.She is on Reclast infusions with her PCP. She will continue as she is on AI now. -Repeat DEXA in 08/2020 at [facility]   3. Depression and Anxiety -On Effexor. F/u with PCP -Has been more stressed and depressed due to her husband being away with truck driving job.  4. 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  -On pain management. Continue to f/u with PCP -Her HA's did improve but after recent head injury her HA's have worsened.  -She has been on Tegretol for 1 year now. Due to drug interaction with Tamoxifen I switched her to Anastrozole so she could continue Tegretol . -Will watch her joint pain on AI.   5. Thyroid dysfunction -on levothyroxine  6. Obesity, s/pbariatric surgery  -s/p Roux-en-bypass gastric surgery -She has gained 20 pounds over 4 months of initial Quarantine. I encouraged her increase her activity level and monitor her diet.  -She will continue to f/u with her PCP and bariatric surgeon.    Plan -Continue anastrozole, I refilled today  -Lab and F/u with NP Lacie in 03/2020  -mammogram and DEXA in Dec 2021 at [facility], will order on her next appointment    No problem-specific Assessment & Plan notes found for this encounter.   No orders of the defined types were placed in this encounter.  I discussed the assessment and treatment plan with the patient. The patient was provided an opportunity to ask questions and all were answered. The patient agreed with the plan and demonstrated an understanding of the instructions.  The patient was advised to call back or seek an in-person evaluation if the symptoms worsen or if the condition fails to improve as anticipated.  The total time spent in the appointment  was 12 minutes.    Truitt Merle,  MD 01/14/2020   I, Joslyn Devon, am acting as scribe for Truitt Merle, MD.   I have reviewed the above documentation for accuracy and completeness, and I agree with the above.

## 2020-01-14 ENCOUNTER — Encounter: Payer: Self-pay | Admitting: Hematology

## 2020-01-14 ENCOUNTER — Inpatient Hospital Stay: Payer: Medicare Other | Attending: Hematology | Admitting: Hematology

## 2020-01-14 DIAGNOSIS — Z170 Estrogen receptor positive status [ER+]: Secondary | ICD-10-CM | POA: Diagnosis not present

## 2020-01-14 DIAGNOSIS — F419 Anxiety disorder, unspecified: Secondary | ICD-10-CM

## 2020-01-14 DIAGNOSIS — C50412 Malignant neoplasm of upper-outer quadrant of left female breast: Secondary | ICD-10-CM | POA: Diagnosis not present

## 2020-01-14 DIAGNOSIS — M2550 Pain in unspecified joint: Secondary | ICD-10-CM

## 2020-01-14 DIAGNOSIS — M8580 Other specified disorders of bone density and structure, unspecified site: Secondary | ICD-10-CM

## 2020-01-14 DIAGNOSIS — Z79811 Long term (current) use of aromatase inhibitors: Secondary | ICD-10-CM

## 2020-01-14 DIAGNOSIS — E669 Obesity, unspecified: Secondary | ICD-10-CM

## 2020-01-14 DIAGNOSIS — R519 Headache, unspecified: Secondary | ICD-10-CM

## 2020-01-14 MED ORDER — ANASTROZOLE 1 MG PO TABS: 1.0000 mg | ORAL_TABLET | Freq: Every day | ORAL | 2 refills | Status: DC

## 2020-01-15 ENCOUNTER — Telehealth: Payer: Self-pay | Admitting: Hematology

## 2020-01-15 NOTE — Telephone Encounter (Signed)
No los per 4/26 

## 2020-03-13 ENCOUNTER — Other Ambulatory Visit: Payer: Self-pay

## 2020-03-13 ENCOUNTER — Emergency Department (HOSPITAL_BASED_OUTPATIENT_CLINIC_OR_DEPARTMENT_OTHER): Payer: Medicare Other

## 2020-03-13 ENCOUNTER — Emergency Department (HOSPITAL_BASED_OUTPATIENT_CLINIC_OR_DEPARTMENT_OTHER)
Admission: EM | Admit: 2020-03-13 | Discharge: 2020-03-13 | Disposition: A | Discharge status: Home / Self Care | Payer: Medicare Other | Attending: Emergency Medicine | Admitting: Emergency Medicine

## 2020-03-13 ENCOUNTER — Encounter (HOSPITAL_BASED_OUTPATIENT_CLINIC_OR_DEPARTMENT_OTHER): Payer: Self-pay | Admitting: Emergency Medicine

## 2020-03-13 DIAGNOSIS — Y9389 Activity, other specified: Secondary | ICD-10-CM | POA: Diagnosis not present

## 2020-03-13 DIAGNOSIS — W19XXXA Unspecified fall, initial encounter: Secondary | ICD-10-CM

## 2020-03-13 DIAGNOSIS — Z79899 Other long term (current) drug therapy: Secondary | ICD-10-CM | POA: Diagnosis not present

## 2020-03-13 DIAGNOSIS — Y999 Unspecified external cause status: Secondary | ICD-10-CM | POA: Diagnosis not present

## 2020-03-13 DIAGNOSIS — Z87891 Personal history of nicotine dependence: Secondary | ICD-10-CM | POA: Insufficient documentation

## 2020-03-13 DIAGNOSIS — Y92512 Supermarket, store or market as the place of occurrence of the external cause: Secondary | ICD-10-CM | POA: Insufficient documentation

## 2020-03-13 DIAGNOSIS — R519 Headache, unspecified: Secondary | ICD-10-CM | POA: Diagnosis not present

## 2020-03-13 DIAGNOSIS — S6991XA Unspecified injury of right wrist, hand and finger(s), initial encounter: Secondary | ICD-10-CM | POA: Diagnosis present

## 2020-03-13 DIAGNOSIS — E039 Hypothyroidism, unspecified: Secondary | ICD-10-CM | POA: Diagnosis not present

## 2020-03-13 DIAGNOSIS — S52501A Unspecified fracture of the lower end of right radius, initial encounter for closed fracture: Secondary | ICD-10-CM | POA: Insufficient documentation

## 2020-03-13 DIAGNOSIS — M545 Low back pain: Secondary | ICD-10-CM | POA: Diagnosis not present

## 2020-03-13 DIAGNOSIS — Z96653 Presence of artificial knee joint, bilateral: Secondary | ICD-10-CM | POA: Insufficient documentation

## 2020-03-13 DIAGNOSIS — I10 Essential (primary) hypertension: Secondary | ICD-10-CM | POA: Insufficient documentation

## 2020-03-13 DIAGNOSIS — W01198A Fall on same level from slipping, tripping and stumbling with subsequent striking against other object, initial encounter: Secondary | ICD-10-CM | POA: Insufficient documentation

## 2020-03-13 MED ORDER — ACETAMINOPHEN 325 MG PO TABS
325.0000 mg | ORAL_TABLET | Freq: Once | ORAL | Status: AC
  Administered 2020-03-13: 325 mg via ORAL
  Filled 2020-03-13: qty 1

## 2020-03-13 MED ORDER — MORPHINE SULFATE (PF) 4 MG/ML IV SOLN
6.0000 mg | Freq: Once | INTRAVENOUS | Status: AC
  Administered 2020-03-13: 6 mg via INTRAMUSCULAR
  Filled 2020-03-13: qty 2

## 2020-03-13 NOTE — ED Triage Notes (Addendum)
Tripped and fell. She hit her head. No blood thinners. C/o R wrist pain and head pain. Pt took oxycodone 10-325mg  at 2pm.

## 2020-03-13 NOTE — ED Notes (Signed)
Provider bedside.

## 2020-03-13 NOTE — ED Notes (Signed)
Cardboard splint applied at triage

## 2020-03-13 NOTE — ED Provider Notes (Signed)
[facility] EMERGENCY DEPARTMENT Provider Note   CSN: [id number] Arrival date & time: 03/13/20  1552     History Chief Complaint  Patient presents with  . Fall    Deborah Cooke is a 69 y.o. female.  Patient is a 69 year old female who presents with wrist pain after a fall.  She said she was bringing in some groceries from a home delivery and turned around too fast, causing her to fall.  She injured her right wrist.  She also hit her upper back on the leg of a table and she has some soreness to her upper back.  She did hit the back of her head.  There is no loss of consciousness.  She is not anticoagulants.  She denies any other injuries from the fall.  She denies any symptoms preceding the fall such as dizziness, chest pain, palpitations or shortness of breath.        Past Medical History:  Diagnosis Date  . Adverse effect of anesthetic 01/2012   difficulty waking up.  . Adverse effect of unspecified anesthetic, initial encounter 08/2011   low o2 sats.  . Anemia   . Anxiety   . Arthritis   . Breast cancer ([place]) 02/16/2017   left breast  . Chronic pain   . Closed supracondylar fracture of right femur ([place]) 09/18/2012  . Depression   . Family history of breast cancer   . GERD (gastroesophageal reflux disease)   . Headache(784.0)   . Hypothyroidism   . Opioid dependence in controlled environment ([place])   . Osteoporosis   . Osteoporosis with fracture 09/19/2012  . Thyroid disease     Patient Active Problem List   Diagnosis Date Noted  . Genetic testing 04/21/2017  . Family history of breast cancer   . Malignant neoplasm of upper-outer quadrant of left breast in female, estrogen receptor positive ([place]) 03/11/2017  . Hypertension   . Thyroid disease   . Osteoporosis   . Anxiety   . Acute blood loss anemia 09/19/2012  . Chronic pain 09/19/2012  . Osteoporosis with fracture 09/19/2012  . Closed supracondylar fracture of right femur ([place]) 09/18/2012    Past  Surgical History:  Procedure Laterality Date  . BREAST BIOPSY Left 02/16/2017  . BREAST LUMPECTOMY WITH RADIOACTIVE SEED AND SENTINEL LYMPH NODE BIOPSY Left 03/15/2017   Procedure: LEFT BREAST LUMPECTOMY WITH RADIOACTIVE SEED AND LEFT AXILLARY SENTINEL LYMPH NODE BIOPSY;  Surgeon: [name] Overall, MD;  Location: [facility];  Service: General;  Laterality: Left;  . CHOLECYSTECTOMY  10/1975  . FEMUR IM NAIL  09/18/2012   Procedure: INTRAMEDULLARY (IM) RETROGRADE FEMORAL NAILING;  Surgeon: [name], MD;  Location: [facility];  Service: Orthopedics;  Laterality: Right;  . GASTRIC BYPASS  2003  . JOINT REPLACEMENT    . REPLACEMENT TOTAL KNEE  2003   right and left -4 months aoart.  . TONSILLECTOMY    . WRIST FRACTURE SURGERY  08/2011     OB History   No obstetric history on file.     Family History  Problem Relation Age of Onset  . Breast cancer Mother 40       died at 27  . Cancer Father 64       uretherial cancer     Social History   Tobacco Use  . Smoking status: Former Smoker    Packs/day: 1.00    Years: 28.00    Pack years: 28.00    Quit date: 01/17/1989  Years since quitting: 31.1  . Smokeless tobacco: Former Systems developer    Quit date: 01/17/1989  Vaping Use  . Vaping Use: Never used  Substance Use Topics  . Alcohol use: Yes    Comment: rarely  . Drug use: No    Home Medications Prior to Admission medications   Medication Sig Start Date End Date Taking? Authorizing Provider  AIMOVIG 140 MG/ML SOAJ Inject 140 mg as directed every 30 (thirty) days. 08/07/18   [provider]  anastrozole (ARIMIDEX) 1 MG tablet Take 1 tablet (1 mg total) by mouth daily. 01/14/20   Truitt Merle, MD  B Complex Vitamins (B COMPLEX 100 PO) Take 1 tablet by mouth daily.    [provider]  Biotin 1000 MCG tablet Take 5,000 mcg by mouth daily.     [provider]  Calcium Carbonate (CALCIUM 600 PO) Take 1 tablet by mouth daily.     [provider]    carbamazepine (TEGRETOL XR) 100 MG 12 hr tablet Take by mouth 3 (three) times daily.  07/26/18 07/27/19  [provider]  cholecalciferol (VITAMIN D) 400 units TABS tablet Take 400 Units by mouth daily.    [provider]  ferrous sulfate 325 (65 FE) MG EC tablet Take 325 mg by mouth 3 (three) times daily with meals.    [provider]  gabapentin (NEURONTIN) 800 MG tablet Take 800 mg by mouth 4 (four) times daily.     [provider]  hydrOXYzine (ATARAX/VISTARIL) 50 MG tablet Take 50 mg by mouth daily as needed.  02/18/17   [provider]  latanoprost (XALATAN) 0.005 % ophthalmic solution Place 1 drop into both eyes daily. At night 06/25/16   [provider]  levothyroxine (SYNTHROID, LEVOTHROID) 50 MCG tablet Take 75 mcg by mouth daily.     [provider]  linaclotide (LINZESS) 290 MCG CAPS capsule Take 290 mcg by mouth daily before breakfast.    [provider]  liothyronine (CYTOMEL) 25 MCG tablet Take 25 mcg by mouth daily.    [provider]  Multiple Vitamins-Iron (MULTIVITAMIN/IRON PO) Take 1 tablet by mouth daily.     [provider]  omeprazole (PRILOSEC) 40 MG capsule Take 40 mg by mouth daily.    [provider]  oxyCODONE-acetaminophen (PERCOCET) 10-325 MG tablet Take 1 tablet by mouth every 6 (six) hours as needed for pain.    [provider]  potassium chloride (K-DUR,KLOR-CON) 10 MEQ tablet Take 10 mEq by mouth daily.    [provider]  QUEtiapine (SEROQUEL XR) 300 MG 24 hr tablet Take 400 mg by mouth daily.     [provider]  tizanidine (ZANAFLEX) 6 MG capsule Take 6 mg by mouth 3 (three) times daily.    [provider]  vortioxetine HBr (TRINTELLIX) 20 MG TABS tablet Take 20 mg by mouth daily.    [provider]    Allergies    Nsaids, Ibuprofen, and Tolmetin  Review of Systems   Review of Systems  Constitutional: Negative for  chills, diaphoresis, fatigue and fever.  HENT: Negative for congestion, rhinorrhea and sneezing.   Eyes: Negative.   Respiratory: Negative for cough, chest tightness and shortness of breath.   Cardiovascular: Negative for chest pain and leg swelling.  Gastrointestinal: Negative for abdominal pain, blood in stool, diarrhea, nausea and vomiting.  Genitourinary: Negative for difficulty urinating, flank pain, frequency and hematuria.  Musculoskeletal: Positive for arthralgias and back pain.  Skin: Negative for  rash.  Neurological: Positive for headaches. Negative for dizziness, speech difficulty, weakness and numbness.    Physical Exam Updated Vital Signs BP (!) 145/93 (BP Location: Left Arm)   Pulse 71   Temp 98.7 F (37.1 C) (Oral)   Resp 20   Ht 5' (1.524 m)   Wt 99.8 kg   SpO2 97%   BMI 42.97 kg/m   Physical Exam Constitutional:      Appearance: She is well-developed.  HENT:     Head: Normocephalic and atraumatic.  Eyes:     Pupils: Pupils are equal, round, and reactive to light.  Neck:     Comments: No pain along the cervical, thoracic or lumbosacral spine.  There are some mild tenderness in the musculature of the right upper back.  No bony tenderness is noted Cardiovascular:     Rate and Rhythm: Normal rate and regular rhythm.     Heart sounds: Normal heart sounds.  Pulmonary:     Effort: Pulmonary effort is normal. No respiratory distress.     Breath sounds: Normal breath sounds. No wheezing or rales.  Chest:     Chest wall: No tenderness.  Abdominal:     General: Bowel sounds are normal.     Palpations: Abdomen is soft.     Tenderness: There is no abdominal tenderness. There is no guarding or rebound.  Musculoskeletal:        General: Normal range of motion.     Cervical back: Normal range of motion and neck supple.     Comments: Patient has swelling and deformity to the right wrist.  There is no pain to the bones of the hand.  No pain to the elbow or shoulder.   Radial pulses are intact.  She has normal sensation in all nerve distributions of the hand.  She does have limited movement of the fingers although I feel this is more likely due to pain rather than nerve injury.  There is no open wounds.  There is no other pain on palpation or range of motion of the extremities  Lymphadenopathy:     Cervical: No cervical adenopathy.  Skin:    General: Skin is warm and dry.     Findings: No rash.  Neurological:     Mental Status: She is alert and oriented to person, place, and time.     ED Results / Procedures / Treatments   Labs (all labs ordered are listed, but only abnormal results are displayed) Labs Reviewed - No data to display  EKG None  Radiology DG Wrist Complete Right  Result Date: 03/13/2020 CLINICAL DATA:  Right wrist pain after fall EXAM: RIGHT WRIST - COMPLETE 3+ VIEW COMPARISON:  None. FINDINGS: Acute comminuted fracture of the distal radial metaphysis with moderate impaction and slight dorsal angulation. Fracture extends intra-articularly to the radiocarpal joint. Mildly displaced ulnar styloid fracture. No additional fracture identified. Mild arthropathy at the first [facility] joint. Soft tissue swelling about the wrist. IMPRESSION: 1. Acute comminuted fracture of the distal radial metaphysis with moderate impaction and slight dorsal angulation. 2. Mildly displaced ulnar styloid fracture. Electronically Signed   By: Davina Poke D.O.   On: 03/13/2020 16:28   CT Head Wo Contrast  Result Date: 03/13/2020 CLINICAL DATA:  69 year old female with head trauma. EXAM: CT HEAD WITHOUT CONTRAST CT CERVICAL SPINE WITHOUT CONTRAST TECHNIQUE: Multidetector CT imaging of the head and cervical spine was performed following the standard protocol without intravenous contrast. Multiplanar CT image reconstructions of the cervical  spine were also generated. COMPARISON:  Head CT dated 05/20/2016. FINDINGS: CT HEAD FINDINGS Brain: There is mild age-related atrophy  and chronic microvascular ischemic changes. There is no acute intracranial hemorrhage. No mass effect or midline shift. No extra-axial fluid collection. Vascular: No hyperdense vessel or unexpected calcification. Skull: Normal. Negative for fracture or focal lesion. Sinuses/Orbits: No acute finding. Other: None CT CERVICAL SPINE FINDINGS Alignment: No acute subluxation. Skull base and vertebrae: No acute fracture. Evaluation however is limited due to advanced osteopenia and streak artifact caused by ACDF. Soft tissues and spinal canal: No prevertebral fluid or swelling. No visible canal hematoma. Disc levels: C3-C7 ACDF and bony fusion. C7-T1 posterior fusion. Severe degenerative changes of the lateral articulation of C1 and C2. Upper chest: Negative. Other: Bilateral carotid bulb calcified plaques. IMPRESSION: 1. No acute intracranial pathology. Mild age-related atrophy and chronic microvascular ischemic changes. 2. No acute/traumatic cervical spine pathology. 3. C3-C7 ACDF and bony fusion. Electronically Signed   By: Anner Crete M.D.   On: 03/13/2020 20:24   CT Cervical Spine Wo Contrast  Result Date: 03/13/2020 CLINICAL DATA:  69 year old female with head trauma. EXAM: CT HEAD WITHOUT CONTRAST CT CERVICAL SPINE WITHOUT CONTRAST TECHNIQUE: Multidetector CT imaging of the head and cervical spine was performed following the standard protocol without intravenous contrast. Multiplanar CT image reconstructions of the cervical spine were also generated. COMPARISON:  Head CT dated 05/20/2016. FINDINGS: CT HEAD FINDINGS Brain: There is mild age-related atrophy and chronic microvascular ischemic changes. There is no acute intracranial hemorrhage. No mass effect or midline shift. No extra-axial fluid collection. Vascular: No hyperdense vessel or unexpected calcification. Skull: Normal. Negative for fracture or focal lesion. Sinuses/Orbits: No acute finding. Other: None CT CERVICAL SPINE FINDINGS Alignment: No acute  subluxation. Skull base and vertebrae: No acute fracture. Evaluation however is limited due to advanced osteopenia and streak artifact caused by ACDF. Soft tissues and spinal canal: No prevertebral fluid or swelling. No visible canal hematoma. Disc levels: C3-C7 ACDF and bony fusion. C7-T1 posterior fusion. Severe degenerative changes of the lateral articulation of C1 and C2. Upper chest: Negative. Other: Bilateral carotid bulb calcified plaques. IMPRESSION: 1. No acute intracranial pathology. Mild age-related atrophy and chronic microvascular ischemic changes. 2. No acute/traumatic cervical spine pathology. 3. C3-C7 ACDF and bony fusion. Electronically Signed   By: Anner Crete M.D.   On: 03/13/2020 20:24    Procedures Procedures (including critical care time)  Medications Ordered in ED Medications  acetaminophen (TYLENOL) tablet 325 mg (325 mg Oral Given 03/13/20 1613)  morphine 4 MG/ML injection 6 mg (6 mg Intramuscular Given 03/13/20 1917)    ED Course  I have reviewed the triage vital signs and the nursing notes.  Pertinent labs & imaging results that were available during my care of the patient were reviewed by me and considered in my medical decision making (see chart for details).    MDM Rules/Calculators/A&P                         Patient is a 69 year old female who presents after mechanical fall.  She has a distal radius fracture that appears to be impacted with some mild dorsal angulation on x-ray.  She appears to be neurovascularly intact although she has some limited movement of her fingers that I think is more due to pain.  She did hit her head.  CT scan of her head and cervical spine show no acute abnormalities.  She does not report  any other injuries.  She was placed in a sugar tong splint.  She was given a sling.  She has chronic pain medicine at home to use for symptomatic care.  She says that her orthopedist is Raliegh Ip.  They actually repaired a radius fracture on the  left side in the past.  She will call tomorrow and make an appointment to follow-up with Dr. Mardelle Matte. Final Clinical Impression(s) / ED Diagnoses Final diagnoses:  Fall, initial encounter  Closed fracture of distal end of right radius, unspecified fracture morphology, initial encounter    Rx / DC Orders ED Discharge Orders    None       Malvin Johns, MD 03/13/20 2123

## 2020-03-14 ENCOUNTER — Ambulatory Visit: Payer: Medicare Other | Admitting: Podiatry

## 2020-04-13 NOTE — Progress Notes (Signed)
Error

## 2020-04-14 ENCOUNTER — Telehealth: Payer: Self-pay | Admitting: Nurse Practitioner

## 2020-04-14 ENCOUNTER — Encounter: Payer: Self-pay | Admitting: Nurse Practitioner

## 2020-04-14 ENCOUNTER — Other Ambulatory Visit: Payer: Self-pay

## 2020-04-14 ENCOUNTER — Inpatient Hospital Stay: Payer: Medicare Other | Attending: Nurse Practitioner | Admitting: Nurse Practitioner

## 2020-04-14 ENCOUNTER — Inpatient Hospital Stay: Payer: Medicare Other

## 2020-04-14 VITALS — BP 161/94 | HR 80 | Temp 97.7°F | Resp 18 | Ht 60.0 in | Wt 231.4 lb

## 2020-04-14 DIAGNOSIS — F419 Anxiety disorder, unspecified: Secondary | ICD-10-CM | POA: Insufficient documentation

## 2020-04-14 DIAGNOSIS — F329 Major depressive disorder, single episode, unspecified: Secondary | ICD-10-CM | POA: Insufficient documentation

## 2020-04-14 DIAGNOSIS — R519 Headache, unspecified: Secondary | ICD-10-CM | POA: Diagnosis not present

## 2020-04-14 DIAGNOSIS — Z9884 Bariatric surgery status: Secondary | ICD-10-CM | POA: Insufficient documentation

## 2020-04-14 DIAGNOSIS — E875 Hyperkalemia: Secondary | ICD-10-CM | POA: Insufficient documentation

## 2020-04-14 DIAGNOSIS — M542 Cervicalgia: Secondary | ICD-10-CM | POA: Insufficient documentation

## 2020-04-14 DIAGNOSIS — M8580 Other specified disorders of bone density and structure, unspecified site: Secondary | ICD-10-CM | POA: Diagnosis not present

## 2020-04-14 DIAGNOSIS — Z170 Estrogen receptor positive status [ER+]: Secondary | ICD-10-CM | POA: Diagnosis not present

## 2020-04-14 DIAGNOSIS — Z79899 Other long term (current) drug therapy: Secondary | ICD-10-CM | POA: Diagnosis not present

## 2020-04-14 DIAGNOSIS — M25519 Pain in unspecified shoulder: Secondary | ICD-10-CM | POA: Diagnosis not present

## 2020-04-14 DIAGNOSIS — Z886 Allergy status to analgesic agent status: Secondary | ICD-10-CM | POA: Diagnosis not present

## 2020-04-14 DIAGNOSIS — G8929 Other chronic pain: Secondary | ICD-10-CM | POA: Insufficient documentation

## 2020-04-14 DIAGNOSIS — R232 Flushing: Secondary | ICD-10-CM | POA: Insufficient documentation

## 2020-04-14 DIAGNOSIS — C50412 Malignant neoplasm of upper-outer quadrant of left female breast: Secondary | ICD-10-CM

## 2020-04-14 LAB — CBC WITH DIFFERENTIAL/PLATELET
Abs Immature Granulocytes: 0.02 10*3/uL (ref 0.00–0.07)
Basophils Absolute: 0 10*3/uL (ref 0.0–0.1)
Basophils Relative: 1 %
Eosinophils Absolute: 0.1 10*3/uL (ref 0.0–0.5)
Eosinophils Relative: 2 %
HCT: 35.5 % — ABNORMAL LOW (ref 36.0–46.0)
Hemoglobin: 12.2 g/dL (ref 12.0–15.0)
Immature Granulocytes: 1 %
Lymphocytes Relative: 33 %
Lymphs Abs: 1.4 10*3/uL (ref 0.7–4.0)
MCH: 30.3 pg (ref 26.0–34.0)
MCHC: 34.4 g/dL (ref 30.0–36.0)
MCV: 88.3 fL (ref 80.0–100.0)
Monocytes Absolute: 0.5 10*3/uL (ref 0.1–1.0)
Monocytes Relative: 13 %
Neutro Abs: 2.1 10*3/uL (ref 1.7–7.7)
Neutrophils Relative %: 50 %
Platelets: 261 10*3/uL (ref 150–400)
RBC: 4.02 MIL/uL (ref 3.87–5.11)
RDW: 12.4 % (ref 11.5–15.5)
WBC: 4.1 10*3/uL (ref 4.0–10.5)
nRBC: 0 % (ref 0.0–0.2)

## 2020-04-14 LAB — COMPREHENSIVE METABOLIC PANEL
ALT: 24 U/L (ref 0–44)
AST: 28 U/L (ref 15–41)
Albumin: 3.7 g/dL (ref 3.5–5.0)
Alkaline Phosphatase: 86 U/L (ref 38–126)
Anion gap: 8 (ref 5–15)
BUN: 15 mg/dL (ref 8–23)
CO2: 27 mmol/L (ref 22–32)
Calcium: 9.7 mg/dL (ref 8.9–10.3)
Chloride: 91 mmol/L — ABNORMAL LOW (ref 98–111)
Creatinine, Ser: 0.77 mg/dL (ref 0.44–1.00)
GFR calc Af Amer: >60 mL/min (ref >60)
GFR calc non Af Amer: >60 mL/min (ref >60)
Glucose, Bld: 90 mg/dL (ref 70–99)
Potassium: 5.5 mmol/L — ABNORMAL HIGH (ref 3.5–5.1)
Sodium: 126 mmol/L — ABNORMAL LOW (ref 135–145)
Total Bilirubin: 0.4 mg/dL (ref 0.3–1.2)
Total Protein: 6.6 g/dL (ref 6.5–8.1)

## 2020-04-14 MED ORDER — SODIUM POLYSTYRENE SULFONATE PO POWD: Freq: Once | ORAL | 0 refills | Status: AC

## 2020-04-14 NOTE — Progress Notes (Addendum)
[place]   [phone] [phone]   Clinic Follow up Note   Patient Care Team: Rich Fuchs, PA as PCP - General (Physician Assistant) Kyung Rudd, MD as Consulting Physician (Radiation Oncology) Truitt Merle, MD as Consulting Physician (Hematology) Lorelle Gibbs, MD as Consulting Physician (Radiology) [name] Overall, MD as Consulting Physician (General Surgery) Gardenia Phlegm, NP as Nurse Practitioner (Hematology and Oncology) 04/14/2020  CHIEF COMPLAINT: F/u left breast cancer   SUMMARY OF ONCOLOGIC HISTORY: Oncology History Overview Note  Cancer Staging Malignant neoplasm of upper-outer quadrant of left breast in female, estrogen receptor positive ([place]) Staging form: Breast, AJCC 8th Edition - Clinical: cT1b, cN0, cM0, G1, ER: Positive, PR: Positive - Unsigned - Pathologic stage from 03/15/2017: Stage IA (pT1b, pN0, cM0, G1, ER: Positive, PR: Positive, HER2: Negative) - Signed by Truitt Merle, MD on 03/19/2017     Malignant neoplasm of upper-outer quadrant of left breast in female, estrogen receptor positive ([place])  02/08/2017 Mammogram   Diagnostic mammogram Screening 02/08/17 IMPRESSION:  Addidiontal Imaging Evaluation needed The new oval mass in the left breast is indeterminate. 3D mediolateral, and spot copression views as well as an ultrasound are recommended   02/10/2017 Mammogram   Mammogram of the left breast 02/10/17 The 6 mm nodule in the left breast is indeterminate. An ultrasound is recommended.     02/10/2017 Imaging   [place] of Left Breast 02/10/17 The 6 mm lymph node with focal corytex thickening in the left breast is at a low suspicion for malignancy. An Ultrasound guided biopsy is recommended.    02/16/2017 Receptors her2   ER: 90% Positive PR: 80% Positive Ki67: 3% HER2: Negative   02/16/2017 Initial Biopsy   Diagnosis 02/16/17 Breast, left, needle core biopsy - INVASIVE DUCTAL CARCINOMA - SEE COMMENT     03/11/2017 Initial Diagnosis   Malignant neoplasm of upper-outer quadrant of left breast in female, estrogen receptor positive ([place])   03/15/2017 Surgery   LEFT BREAST LUMPECTOMY WITH RADIOACTIVE SEED AND LEFT AXILLARY SENTINEL LYMPH NODE BIOPSY by Dr Lucia Gaskins    03/15/2017 Pathology Results   Diagnosis 03/15/17 1. 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 - RESECTION MARGINS ARE NEGATIVE. - BIOPSY SITE. - SCLEROSING ADENOSIS, FIBROCYSTIC CHANGE. - SEE ONCOLOGY TABLE. 2. Lymph node, sentinel, biopsy, Left axillary - ONE OF ONE LYMPH NODES NEGATIVE FOR CARCINOMA (0/1). 3. Lymph node, sentinel, biopsy, Left - ONE OF ONE LYMPH NODES NEGATIVE FOR CARCINOMA (0/1). 4. Lymph node, sentinel, biopsy, Left - ONE OF ONE LYMPH NODES NEGATIVE FOR CARCINOMA (0/1). 5. Lymph node, sentinel, biopsy, Left - ONE OF ONE LYMPH NODES NEGATIVE FOR CARCINOMA (0/1). 6. Lymph node, sentinel, biopsy, Left - ONE OF ONE LYMPH NODES NEGATIVE FOR CARCINOMA (0/1).    04/20/2017 Genetic Testing   Patient had genetic testing due to a family history of breast cancer and Ashkenazi Jewish ancestry.  She was tested for the [facility] Common Hereditary Cancers Panel.  The Hereditary Gene Panel offered by [facility] includes sequencing and/or deletion duplication testing of the following 46 genes: APC, ATM, AXIN2, BARD1, BMPR1A, BRCA1, BRCA2, BRIP1, CDH1, CDKN2A (p14ARF), CDKN2A (p16INK4a), CHEK2, CTNNA1, DICER1, EPCAM (Deletion/duplication testing only), GREM1 (promoter region deletion/duplication testing only), KIT, MEN1, MLH1, MSH2, MSH3, MSH6, MUTYH, NBN, NF1, NHTL1, PALB2, PDGFRA, PMS2, POLD1, POLE, PTEN, RAD50, RAD51C, RAD51D, SDHB, SDHC, SDHD, SMAD4, SMARCA4. STK11, TP53, TSC1, TSC2, and VHL.  The following genes were evaluated for  sequence changes only: SDHA and HOXB13 c.251G>A variant only.  Results: No pathogenic mutations identified in the 46 genes  analyzed.   The date of this test report is 04/20/2017.    04/25/2017 - 05/20/2017 Radiation Therapy   Adjuvant breast radiation, Dr. Lisbeth Renshaw   Radiation treatment dates:   04/25/2017 - 05/20/2017  Site/dose:  Left breast / 42.5 Gy in 17 fractions & boost / 7.5 Gy in 3 fractions   Beams/energy:   Photons/ 15X, 6X    Narrative: The patient tolerated radiation treatment relatively well.      05/2017 -  Anti-estrogen oral therapy   Tamoxifen 33m daily started 05/2017. Due to drug interaction with Tegretol will switch to anastrozole 136min 09/2019.      CURRENT THERAPY: Tamoxifen2062mailystarted9/2018. Due to drug interaction with Tegretol, switched to anastrozole 1mg7m 09/2019.  INTERVAL HISTORY: Ms. GannPepinurns for routine surveillance visit as scheduled. Her last visit on 01/14/20 was virtual.  She is here today with her mother-in-law who is helping her out due to recent fall with right wrist fracture and subsequent surgery.  She continues to have some pain but is recovering well.  She continues anastrozole which she tolerates well.  She has gained weight lately which is partly diet and partly thyroid related.  Her medication has been adjusted.  She has chronic neck and shoulder pain and headaches, on long-term oxycodone.  She is followed by [facility] pain management with a pain contract.  Denies new bone pain.  Has occasional but stable hot flashes.  Mood is "fine" stable on medication.  Denies concerns in her breast such as new lump/mass, nipple discharge, or skin change.  Denies nausea, vomiting, constipation, diarrhea, bleeding, abdominal pain, cough, chest pain, or dyspnea.   MEDICAL HISTORY:  Past Medical History:  Diagnosis Date  . Adverse effect of anesthetic 01/2012   difficulty waking up.  . Adverse effect of unspecified anesthetic, initial encounter 08/2011   low o2 sats.  . Anemia   . Anxiety   . Arthritis   . Breast cancer (HCC)[place]/30/2018   left breast  . Chronic  pain   . Closed supracondylar fracture of right femur (HCC)[place]/30/2013  . Depression   . Family history of breast cancer   . GERD (gastroesophageal reflux disease)   . Headache(784.0)   . Hypothyroidism   . Opioid dependence in controlled environment (HCC)[place]. Osteoporosis   . Osteoporosis with fracture 09/19/2012  . Thyroid disease     SURGICAL HISTORY: Past Surgical History:  Procedure Laterality Date  . BREAST BIOPSY Left 02/16/2017  . BREAST LUMPECTOMY WITH RADIOACTIVE SEED AND SENTINEL LYMPH NODE BIOPSY Left 03/15/2017   Procedure: LEFT BREAST LUMPECTOMY WITH RADIOACTIVE SEED AND LEFT AXILLARY SENTINEL LYMPH NODE BIOPSY;  Surgeon: NewmAlphonsa Overall;  [facility];  Laterality: Left;  . CHOLECYSTECTOMY  10/1975  . FEMUR IM NAIL  09/18/2012   Procedure: INTRAMEDULLARY (IM) RETROGRADE FEMORAL NAILING;  Surgeon: JoshJohnny Bridge;  [facility];  Laterality: Right;  . GASTRIC BYPASS  2003  . JOINT REPLACEMENT    . REPLACEMENT TOTAL KNEE  2003   right and left -4 months aoart.  . TONSILLECTOMY    . WRIST FRACTURE SURGERY  08/2011    I have reviewed the social history and family history with the patient and they are unchanged from previous note.  ALLERGIES:  is allergic to nsaids, ibuprofen, and  tolmetin.  MEDICATIONS:  Current Outpatient Medications  Medication Sig Dispense Refill  . AIMOVIG 140 MG/ML SOAJ Inject 140 mg as directed every 30 (thirty) days.    Marland Kitchen anastrozole (ARIMIDEX) 1 MG tablet Take 1 tablet (1 mg total) by mouth daily. 90 tablet 2  . B Complex Vitamins (B COMPLEX 100 PO) Take 1 tablet by mouth daily.    . Biotin 1000 MCG tablet Take 5,000 mcg by mouth daily.     . Calcium Carbonate (CALCIUM 600 PO) Take 1 tablet by mouth daily.     . cholecalciferol (VITAMIN D) 400 units TABS tablet Take 400 Units by mouth daily.    . ferrous sulfate 325 (65 FE) MG EC tablet Take 325 mg by mouth 3 (three)  times daily with meals.    . gabapentin (NEURONTIN) 800 MG tablet Take 800 mg by mouth 4 (four) times daily.     . hydrOXYzine (ATARAX/VISTARIL) 50 MG tablet Take 50 mg by mouth daily as needed.   0  . latanoprost (XALATAN) 0.005 % ophthalmic solution Place 1 drop into both eyes daily. At night    . levothyroxine (SYNTHROID, LEVOTHROID) 50 MCG tablet Take 75 mcg by mouth daily.     Marland Kitchen liothyronine (CYTOMEL) 25 MCG tablet Take 25 mcg by mouth daily.    . Multiple Vitamins-Iron (MULTIVITAMIN/IRON PO) Take 1 tablet by mouth daily.     Marland Kitchen omeprazole (PRILOSEC) 40 MG capsule Take 40 mg by mouth daily.    Marland Kitchen oxyCODONE-acetaminophen (PERCOCET) 10-325 MG tablet Take 1 tablet by mouth every 6 (six) hours as needed for pain.    . potassium chloride (K-DUR,KLOR-CON) 10 MEQ tablet Take 10 mEq by mouth daily.    . QUEtiapine (SEROQUEL XR) 300 MG 24 hr tablet Take 400 mg by mouth daily.     . tizanidine (ZANAFLEX) 6 MG capsule Take 6 mg by mouth 3 (three) times daily.    Marland Kitchen vortioxetine HBr (TRINTELLIX) 20 MG TABS tablet Take 20 mg by mouth daily.    . carbamazepine (TEGRETOL XR) 100 MG 12 hr tablet Take by mouth 3 (three) times daily.     Marland Kitchen linaclotide (LINZESS) 290 MCG CAPS capsule Take 290 mcg by mouth daily before breakfast. (Patient not taking: Reported on 04/14/2020)    . sodium polystyrene (KAYEXALATE) powder Take by mouth once for 1 dose. 15 g 0   No current facility-administered medications for this visit.    PHYSICAL EXAMINATION: ECOG PERFORMANCE STATUS: 1 - Symptomatic but completely ambulatory  Vitals:   04/14/20 1139  BP: (!) 161/94  Pulse: 80  Resp: 18  Temp: 97.7 F (36.5 C)  SpO2: 99%   Filed Weights   04/14/20 1139  Weight: (!) 231 lb 6.4 oz (105 kg)    GENERAL:alert, no distress and comfortable SKIN: No rash to exposed skin EYES: sclera clear NECK: Without mass LYMPH:  no palpable cervical or supraclavicular lymphadenopathy  LUNGS: clear with normal breathing effort HEART:  regular rate & rhythm ABDOMEN: Obese, abdomen soft, non-tender and normal bowel sounds Musculoskeletal: Tenderness in the cervical spine, no other focal spinal tenderness.  Hard cast to right wrist and forearm NEURO: alert & oriented x 3 with fluent speech [place] exam: S/p left lumpectomy and radiation.  Incisions completely healed.  There are comedones in the left axilla.  Scar tissue noted to the left lateral breast incision.  No other palpable mass in either breast or axilla that I could appreciate.  LABORATORY DATA:  I have reviewed  the data as listed CBC Latest Ref Rng & Units 04/14/2020 10/15/2019 06/18/2019  WBC 4.0 - 10.5 K/uL 4.1 4.5 5.0  Hemoglobin 12.0 - 15.0 g/dL 12.2 12.2 12.0  Hematocrit 36 - 46 % 35.5(L) 36.4 36.9  Platelets 150 - 400 K/uL 261 249 247     CMP Latest Ref Rng & Units 04/14/2020 10/15/2019 06/18/2019  Glucose 70 - 99 mg/dL 90 95 99  BUN 8 - 23 mg/dL _0 Creatinine 0.44 - 1.00 mg/dL 0.77 0.73 0.80  Sodium 135 - 145 mmol/L 126(L) 133(L) 136  Potassium 3.5 - 5.1 mmol/L 5.5(H) 5.3(H) 4.6  Chloride 98 - 111 mmol/L 91(L) 97(L) 101  CO2 22 - 32 mmol/L _1 Calcium 8.9 - 10.3 mg/dL 9.7 8.6(L) 8.7(L)  Total Protein 6.5 - 8.1 g/dL 6.6 6.5 6.8  Total Bilirubin 0.3 - 1.2 mg/dL 0.4 0.3 0.4  Alkaline Phos 38 - 126 U/L 86 68 80  AST 15 - 41 U/L _2 ALT 0 - 44 U/L _3 RADIOGRAPHIC STUDIES: I have personally reviewed the radiological images as listed and agreed with the findings in the report. No results found.   ASSESSMENT & PLAN: SOPHIRA RUMLER is a 69 y.o. female with    1. Malignant neoplasm of upper-outer quadrant of left breast in female, estrogen receptor positive,pT1bN0M0, Stage 1A, ER/PR Positive, Her2 negative, Grade 1 -She was diagnosed in 01/2017. She is s/pleft breast lumpectomyand SLN biopsy and adjuvant radiation -She started adjuvant Tamoxifenin 05/2017.Due to drug interaction with carbamazepine (Tegretol)she was  switched to Anastrozole in 09/2019. She had high copay for Exemestane so she could not take it.  -Ms. Delaine is clinically doing well from breast cancer standpoint. Labs show electrolyte abnormality likely due to antidepressants. Other wise CBC and CMP unremarkable.  -physical exam is benign. Mammogram in 08/2019 negative. No clinical concern for recurrence.  -she will continue surveillance and anastrozole which she is tolerating well with stable hot flash; chronic bone pain is stable.  -next mammogram and D[facility] at [facility] in 08/2020.  -lab, f/u with Dr. Burr Medico in 6 months   2. S/p mechanical fall in 02/2019 with right wrist fracture, surgery  -follow up with Dr. Mardelle Matte 04/23/20 -mother in law here with her to help at home -pain is improving, she is recovering well   3. Osteopenia -DEXA scan in8/2019 revealed T score of -1.7 at left femur  -She is on calcium and Vit D.She is on Reclast infusions with her PCP. She will continue as she is on AI now. -Repeat DEXA in 08/2020 at [facility] with mammogram   4. Depression and Anxiety -on Seroquel, Trintellix; mood is stable. Recently had mental health f/u and med check -Her antidepressants are likely contributing to electrolyte abnormalities, Na 126, K 5.5 today. She is having BMs with miralax.  -I recommend her to stop potassium supplement, give Kayexalate x1, increase hydration, and take salt tabs once daily for 1 week. -I recommend to follow-up with PCP and repeat labs in 2 weeks, will fax my note  5. ChronicHAand joint pain(neck and shoulders) -HAfrom a previous RTA, nerve damage. -On chronic pain management contract through [facility]  -She has been on Tegretol for over 1 year. Due to drug interaction with Tamoxifen she was switched to anastrozole in 09/2019  -No new joint pain on AI  6. Thyroid dysfunction -on levothyroxine  7. Obesity, s/pbariatric surgery  -s/p Roux-en-bypass gastric surgery -Steady weight  gain since 2019 -She will  continue to f/u with her PCP and bariatric surgeon.   PLAN: -Labs, mammogram reviewed -Continue AI and surveillance -Mammogram and D[facility]A due 08/2020 at [facility]  -Stop K supplement, kayexelate x1 for hyperkalemia  -Increase hydration, consider salt tab once daily for hyponatremia -lab at PCP in 2 weeks, will CC my note -Routine breast cancer surveillance visit in 6 months     Orders Placed This Encounter  Procedures  . MM DIAG BREAST TOMO BILATERAL    Standing Status:   Future    Standing Expiration Date:   04/14/2021    Scheduling Instructions:     [facility]    Order Specific Question:   Reason for Exam (SYMPTOM  OR DIAGNOSIS REQUIRED)    Answer:   History of left breast cancer s/p lumpectomy, radiation, on AI    Order Specific Question:   Preferred imaging location?    Answer:   External  . DG Bone Density    Standing Status:   Future    Standing Expiration Date:   04/14/2021    Scheduling Instructions:     [facility]    Order Specific Question:   Reason for Exam (SYMPTOM  OR DIAGNOSIS REQUIRED)    Answer:   On AI for history of breast cancer    Order Specific Question:   Preferred imaging location?    Answer:   External   All questions were answered. The patient knows to call the clinic with any problems, questions or concerns. No barriers to learning were detected. Total encounter time was 30 minutes.      Alla Feeling, NP 04/14/20

## 2020-04-14 NOTE — Telephone Encounter (Signed)
Attempted to schedule appointment per 7/26 scheduling message for patient. Patient told me she was scheduling her lab appointment with her PCP. RN Colletta Maryland is aware.

## 2020-04-14 NOTE — Telephone Encounter (Signed)
Scheduled per 7/26 los. Printed avs and calendar for pt.  

## 2020-07-22 ENCOUNTER — Encounter: Payer: Self-pay | Admitting: Hematology

## 2020-07-23 ENCOUNTER — Other Ambulatory Visit: Payer: Self-pay

## 2020-10-12 NOTE — Progress Notes (Addendum)
Deborah Cooke   [phone] [phone]) [phone]   Clinic Follow up Note   Patient Care Team: Rich Fuchs, PA as PCP - General (Physician Assistant) Kyung Rudd, MD as Consulting Physician (Radiation Oncology) Truitt Merle, MD as Consulting Physician (Hematology) Lorelle Gibbs, MD as Consulting Physician (Radiology) [name] Overall, MD as Consulting Physician (General Surgery) Gardenia Phlegm, NP as Nurse Practitioner (Hematology and Oncology) 10/14/2020  CHIEF COMPLAINT: Follow up left breast cancer   SUMMARY OF ONCOLOGIC HISTORY: Oncology History Overview Note  Cancer Staging Malignant neoplasm of upper-outer quadrant of left breast in female, estrogen receptor positive ([place]) Staging form: Breast, AJCC 8th Edition - Clinical: cT1b, cN0, cM0, G1, ER: Positive, PR: Positive - Unsigned - Pathologic stage from 03/15/2017: Stage IA (pT1b, pN0, cM0, G1, ER: Positive, PR: Positive, HER2: Negative) - Signed by Truitt Merle, MD on 03/19/2017     Malignant neoplasm of upper-outer quadrant of left breast in female, estrogen receptor positive ([place])  02/08/2017 Mammogram   Diagnostic mammogram Screening 02/08/17 IMPRESSION:  Addidiontal Imaging Evaluation needed The new oval mass in the left breast is indeterminate. 3D mediolateral, and spot copression views as well as an ultrasound are recommended   02/10/2017 Mammogram   Mammogram of the left breast 02/10/17 The 6 mm nodule in the left breast is indeterminate. An ultrasound is recommended.     02/10/2017 Imaging   [place] of Left Breast 02/10/17 The 6 mm lymph node with focal corytex thickening in the left breast is at a low suspicion for malignancy. An Ultrasound guided biopsy is recommended.    02/16/2017 Receptors her2   ER: 90% Positive PR: 80% Positive Ki67: 3% HER2: Negative   02/16/2017 Initial Biopsy   Diagnosis 02/16/17 Breast, left, needle core biopsy - INVASIVE DUCTAL CARCINOMA - SEE  COMMENT    03/11/2017 Initial Diagnosis   Malignant neoplasm of upper-outer quadrant of left breast in female, estrogen receptor positive ([place])   03/15/2017 Surgery   LEFT BREAST LUMPECTOMY WITH RADIOACTIVE SEED AND LEFT AXILLARY SENTINEL LYMPH NODE BIOPSY by Dr Lucia Gaskins    03/15/2017 Pathology Results   Diagnosis 03/15/17 1. 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 - RESECTION MARGINS ARE NEGATIVE. - BIOPSY SITE. - SCLEROSING ADENOSIS, FIBROCYSTIC CHANGE. - SEE ONCOLOGY TABLE. 2. Lymph node, sentinel, biopsy, Left axillary - ONE OF ONE LYMPH NODES NEGATIVE FOR CARCINOMA (0/1). 3. Lymph node, sentinel, biopsy, Left - ONE OF ONE LYMPH NODES NEGATIVE FOR CARCINOMA (0/1). 4. Lymph node, sentinel, biopsy, Left - ONE OF ONE LYMPH NODES NEGATIVE FOR CARCINOMA (0/1). 5. Lymph node, sentinel, biopsy, Left - ONE OF ONE LYMPH NODES NEGATIVE FOR CARCINOMA (0/1). 6. Lymph node, sentinel, biopsy, Left - ONE OF ONE LYMPH NODES NEGATIVE FOR CARCINOMA (0/1).    04/20/2017 Genetic Testing   Patient had genetic testing due to a family history of breast cancer and Ashkenazi Jewish ancestry.  She was tested for the [facility] Common Hereditary Cancers Panel.  The Hereditary Gene Panel offered by [facility] includes sequencing and/or deletion duplication testing of the following 46 genes: APC, ATM, AXIN2, BARD1, BMPR1A, BRCA1, BRCA2, BRIP1, CDH1, CDKN2A (p14ARF), CDKN2A (p16INK4a), CHEK2, CTNNA1, DICER1, EPCAM (Deletion/duplication testing only), GREM1 (promoter region deletion/duplication testing only), KIT, MEN1, MLH1, MSH2, MSH3, MSH6, MUTYH, NBN, NF1, NHTL1, PALB2, PDGFRA, PMS2, POLD1, POLE, PTEN, RAD50, RAD51C, RAD51D, SDHB, SDHC, SDHD, SMAD4, SMARCA4. STK11, TP53, TSC1, TSC2, and VHL.  The following genes were evaluated  for sequence changes only: SDHA and HOXB13 c.251G>A variant only.  Results: No pathogenic mutations identified in the  46 genes analyzed.   The date of this test report is 04/20/2017.    04/25/2017 - 05/20/2017 Radiation Therapy   Adjuvant breast radiation, Dr. Lisbeth Renshaw   Radiation treatment dates:   04/25/2017 - 05/20/2017  Site/dose:  Left breast / 42.5 Gy in 17 fractions & boost / 7.5 Gy in 3 fractions   Beams/energy:   Photons/ 15X, 6X    Narrative: The patient tolerated radiation treatment relatively well.      05/2017 -  Anti-estrogen oral therapy   Tamoxifen 5m daily started 05/2017. Due to drug interaction with Tegretol will switch to anastrozole 141min 09/2019.      CURRENT THERAPY:  Tamoxifen2039mailystarted9/2018. Due to drug interaction with Tegretol, switched toanastrozole 1mg2m1/2021.  INTERVAL HISTORY: Ms. GannFormisanourns for f/up as scheduled. She was last seen 03/2020. [facility] normal per pt. She finally recovered from right wrist injury and d/c'd from ortho. She developed new acute left hip pain 2 weeks ago, getting worse. She has been increasing her steps back to goal 9K per day. She is needing a cane. On chronic pain meds, taking more tylenol. She thought she was having a "charlie horse" or related to left knee but left knee is not painful. No change to other chronic pains. Denies concerns in breast such as new lump/mass, nipple discharge or inversion, or skin change. Depression meds adjusted, wellbutrin added which has been good. Denies unintentional weight loss, decreased appetite, fatigue, abdominal pain/bloating, n/v/c/d, fever, chills, cough, chest pain, dyspnea.    MEDICAL HISTORY:  Past Medical History:  Diagnosis Date  . Adverse effect of anesthetic 01/2012   difficulty waking up.  . Adverse effect of unspecified anesthetic, initial encounter 08/2011   low o2 sats.  . Anemia   . Anxiety   . Arthritis   . Breast cancer (HCC)[place]/30/2018   left breast  . Chronic pain   . Closed supracondylar fracture of right femur (HCC)[place]/30/2013   . Depression   . Family history of breast cancer   . GERD (gastroesophageal reflux disease)   . Headache(784.0)   . Hypothyroidism   . Opioid dependence in controlled environment (HCC)[place]. Osteoporosis   . Osteoporosis with fracture 09/19/2012  . Thyroid disease     SURGICAL HISTORY: Past Surgical History:  Procedure Laterality Date  . BREAST BIOPSY Left 02/16/2017  . BREAST LUMPECTOMY WITH RADIOACTIVE SEED AND SENTINEL LYMPH NODE BIOPSY Left 03/15/2017   Procedure: LEFT BREAST LUMPECTOMY WITH RADIOACTIVE SEED AND LEFT AXILLARY SENTINEL LYMPH NODE BIOPSY;  Surgeon: NewmAlphonsa Overall;  [facility] [place];  Laterality: Left;  . CHOLECYSTECTOMY  10/1975  . FEMUR IM NAIL  09/18/2012   Procedure: INTRAMEDULLARY (IM) RETROGRADE FEMORAL NAILING;  Surgeon: JoshJohnny Bridge;  [facility] [place] [facility];  Laterality: Right;  . GASTRIC BYPASS  2003  . JOINT REPLACEMENT    . REPLACEMENT TOTAL KNEE  2003   right and left -4 months aoart.  . TONSILLECTOMY    . WRIST FRACTURE SURGERY  08/2011    I have reviewed the social history and family history with the patient and they are unchanged from previous note.  ALLERGIES:  is allergic to nsaids, ibuprofen, and tolmetin.  MEDICATIONS:  Current Outpatient Medications  Medication Sig Dispense Refill  . AIMOVIG 140  MG/ML SOAJ Inject 140 mg as directed every 30 (thirty) days.    Marland Kitchen anastrozole (ARIMIDEX) 1 MG tablet Take 1 tablet (1 mg total) by mouth daily. 90 tablet 2  . B Complex Vitamins (B COMPLEX 100 PO) Take 1 tablet by mouth daily.    . Biotin 1000 MCG tablet Take 5,000 mcg by mouth daily.     . Calcium Carbonate (CALCIUM 600 PO) Take 1 tablet by mouth daily.     . carbamazepine (TEGRETOL XR) 100 MG 12 hr tablet Take by mouth 3 (three) times daily.     . cholecalciferol (VITAMIN D) 400 units TABS tablet Take 400 Units by mouth daily.    . ferrous sulfate 325 (65 FE) MG EC tablet Take 325 mg  by mouth 3 (three) times daily with meals.    . gabapentin (NEURONTIN) 800 MG tablet Take 800 mg by mouth 4 (four) times daily.     . hydrOXYzine (ATARAX/VISTARIL) 50 MG tablet Take 50 mg by mouth daily as needed.   0  . latanoprost (XALATAN) 0.005 % ophthalmic solution Place 1 drop into both eyes daily. At night    . levothyroxine (SYNTHROID, LEVOTHROID) 50 MCG tablet Take 75 mcg by mouth daily.     Marland Kitchen linaclotide (LINZESS) 290 MCG CAPS capsule Take 290 mcg by mouth daily before breakfast. (Patient not taking: Reported on 04/14/2020)    . liothyronine (CYTOMEL) 25 MCG tablet Take 25 mcg by mouth daily.    . Multiple Vitamins-Iron (MULTIVITAMIN/IRON PO) Take 1 tablet by mouth daily.     Marland Kitchen omeprazole (PRILOSEC) 40 MG capsule Take 40 mg by mouth daily.    Marland Kitchen oxyCODONE-acetaminophen (PERCOCET) 10-325 MG tablet Take 1 tablet by mouth every 6 (six) hours as needed for pain.    . potassium chloride (K-DUR,KLOR-CON) 10 MEQ tablet Take 10 mEq by mouth daily.    . QUEtiapine (SEROQUEL XR) 300 MG 24 hr tablet Take 400 mg by mouth daily.     . tizanidine (ZANAFLEX) 6 MG capsule Take 6 mg by mouth 3 (three) times daily.    Marland Kitchen vortioxetine HBr (TRINTELLIX) 20 MG TABS tablet Take 20 mg by mouth daily.     No current facility-administered medications for this visit.    PHYSICAL EXAMINATION:  Vitals:   10/14/20 1129  BP: (!) 164/82  Pulse: (!) 104  Resp: 19  Temp: 98.4 F (36.9 C)  SpO2: 99%   Filed Weights   10/14/20 1129  Weight: 235 lb 1.6 oz (106.6 kg)    GENERAL:alert, no distress and comfortable SKIN: no rash  EYES: sclera clear NECK: without mass LYMPH:  no palpable cervical or supraclavicular lymphadenopathy LUNGS:  with normal breathing effort Musculoskeletal: no focal left hip tenderness  NEURO: alert & oriented x 3 with fluent speech Breast: Breasts are asymmetric without nipple discharge or inversion.  S/p left lumpectomy and radiation.  Incisions completely healed with scar tissue,  breast is diffusely firm.  No palpable mass in either breast or axilla that I could appreciate  LABORATORY DATA:  I have reviewed the data as listed CBC Latest Ref Rng & Units 10/14/2020 04/14/2020 10/15/2019  WBC 4.0 - 10.5 K/uL 5.9 4.1 4.5  Hemoglobin 12.0 - 15.0 g/dL 12.2 12.2 12.2  Hematocrit 36.0 - 46.0 % 36.6 35.5(L) 36.4  Platelets 150 - 400 K/uL 274 261 249     CMP Latest Ref Rng & Units 10/14/2020 04/14/2020 10/15/2019  Glucose 70 - 99 mg/dL 90 90 95  BUN 8 -  23 mg/dL '21 15 17  ' Creatinine 0.44 - 1.00 mg/dL 0.76 0.77 0.73  Sodium 135 - 145 mmol/L 134(L) 126(L) 133(L)  Potassium 3.5 - 5.1 mmol/L 4.8 5.5(H) 5.3(H)  Chloride 98 - 111 mmol/L 96(L) 91(L) 97(L)  CO2 22 - 32 mmol/L '30 27 27  ' Calcium 8.9 - 10.3 mg/dL 9.1 9.7 8.6(L)  Total Protein 6.5 - 8.1 g/dL 6.9 6.6 6.5  Total Bilirubin 0.3 - 1.2 mg/dL 0.3 0.4 0.3  Alkaline Phos 38 - 126 U/L 90 86 68  AST 15 - 41 U/L '30 28 28  ' ALT 0 - 44 U/L '24 24 27      ' RADIOGRAPHIC STUDIES: I have personally reviewed the radiological images as listed and agreed with the findings in the report. No results found.   ASSESSMENT & PLAN: ABY GESSEL a 70 y.o.femalewith    1. Malignant neoplasm of upper-outer quadrant of left breast in female, estrogen receptor positive,pT1bN0M0, Stage 1A, ER/PR Positive, Her2 negative, Grade 1 -Diagnosed in 01/2017. S/pleft breast lumpectomyand SLN biopsy and adjuvant radiation -Shestarted adjuvantTamoxifenin9/2018.Due to drug interaction withcarbamazepine (Tegretol)she was switched to Anastrozole in 09/2019. She had high copay for Exemestane so she could not take it. -Mammograms annually in December [facility]), reportedly negative 08/2020  -continue surveillance and AI  2. S/p mechanical fall in 02/2019 with right wrist fracture, surgery  -Per Dr. Mardelle Matte -resolved  3. Osteopenia -DEXA scan in8/2019 revealed T score of -1.7 at left femur  -She is on calcium and Vit D and Reclast infusions  with her PCP. She will continue as sheis on AI now. -Repeat DEXA was not done with mammogram in 08/2020   4. Depression and Anxiety -Management per PCP, recently added Wellbutrin which is helpful  5. ChronicHAand joint pain(neck and shoulders) -HAfrom a previous RTA, nerve damage. -On chronic pain management contract through [facility]  -She has been on Tegretol for over 1 year.Due to drug interaction with Tamoxifen she was switched to anastrozole in 09/2019  -chronic pains are stable  6. Thyroid dysfunction -on levothyroxine  7. Obesity, s/pbariatric surgery  -s/p Roux-en-bypass gastric surgery -Steady weight gain since 2019 but gaining recently -Recent weight gain may be contributing to left hip pain    Disposition: Ms. Montagna is clinically doing well.  She continues to tolerate anastrozole. Breast exam is benign. CBC and CMP are unremarkable.  Mammogram reportedly negative, I have requested the report.  Overall there is no clinical concern for cancer recurrence. We discussed she is nearly 4 years from her initial diagnosis, the recurrence risk is decreased.  She developed acute left hip pain, no tenderness on exam.  We discussed possible etiologies including arthritis, recent weight gain, MSK overuse from increased activity, side effect from AI, versus other.  I have a low suspicion this is cancer related pain.  She has been referred for left hip/pelvis x-ray.  I will call her with results.  If her work-up is negative, continue surveillance and anastrozole with next routine visit in 6 months.     Orders Placed This Encounter  Procedures  . DG HIP UNILAT WITH PELVIS MIN 4 VIEWS LEFT    Standing Status:   Future    Number of Occurrences:   1    Standing Expiration Date:   10/14/2021    Order Specific Question:   Reason for Exam (SYMPTOM  OR DIAGNOSIS REQUIRED)    Answer:   new onset left hip pain with ambulation. h/o arthritis, breast cancer on AI    Order  Specific  Question:   Preferred imaging location?    Answer:   [facility]   All questions were answered. The patient knows to call the clinic with any problems, questions or concerns. No barriers to learning were detected. Total encounter time was 30 minutes.      Alla Feeling, NP 10/14/20

## 2020-10-14 ENCOUNTER — Emergency Department (HOSPITAL_BASED_OUTPATIENT_CLINIC_OR_DEPARTMENT_OTHER): Payer: Medicare Other

## 2020-10-14 ENCOUNTER — Inpatient Hospital Stay: Payer: Medicare Other

## 2020-10-14 ENCOUNTER — Inpatient Hospital Stay (HOSPITAL_BASED_OUTPATIENT_CLINIC_OR_DEPARTMENT_OTHER)
Admission: EM | Admit: 2020-10-14 | Discharge: 2020-10-21 | DRG: 481 | Disposition: A | Discharge status: Skilled Nursing Facility | Payer: Medicare Other | Attending: Family Medicine | Admitting: Family Medicine

## 2020-10-14 ENCOUNTER — Ambulatory Visit (HOSPITAL_COMMUNITY)
Admission: RE | Admit: 2020-10-14 | Discharge: 2020-10-14 | Disposition: A | Discharge status: Home / Self Care | Payer: Medicare Other | Source: Ambulatory Visit | Attending: Nurse Practitioner | Admitting: Nurse Practitioner

## 2020-10-14 ENCOUNTER — Inpatient Hospital Stay: Payer: Medicare Other | Attending: Nurse Practitioner | Admitting: Nurse Practitioner

## 2020-10-14 ENCOUNTER — Encounter: Payer: Self-pay | Admitting: Nurse Practitioner

## 2020-10-14 ENCOUNTER — Encounter (HOSPITAL_BASED_OUTPATIENT_CLINIC_OR_DEPARTMENT_OTHER): Payer: Self-pay | Admitting: *Deleted

## 2020-10-14 ENCOUNTER — Other Ambulatory Visit: Payer: Self-pay

## 2020-10-14 VITALS — BP 164/82 | HR 104 | Temp 98.4°F | Resp 19 | Ht 60.0 in | Wt 235.1 lb

## 2020-10-14 DIAGNOSIS — W101XXA Fall (on)(from) sidewalk curb, initial encounter: Secondary | ICD-10-CM | POA: Diagnosis present

## 2020-10-14 DIAGNOSIS — F419 Anxiety disorder, unspecified: Secondary | ICD-10-CM | POA: Diagnosis present

## 2020-10-14 DIAGNOSIS — E039 Hypothyroidism, unspecified: Secondary | ICD-10-CM | POA: Diagnosis present

## 2020-10-14 DIAGNOSIS — E079 Disorder of thyroid, unspecified: Secondary | ICD-10-CM | POA: Diagnosis not present

## 2020-10-14 DIAGNOSIS — Z6841 Body Mass Index (BMI) 40.0 and over, adult: Secondary | ICD-10-CM

## 2020-10-14 DIAGNOSIS — Z79890 Hormone replacement therapy (postmenopausal): Secondary | ICD-10-CM | POA: Diagnosis not present

## 2020-10-14 DIAGNOSIS — Y92531 Health care provider office as the place of occurrence of the external cause: Secondary | ICD-10-CM

## 2020-10-14 DIAGNOSIS — E861 Hypovolemia: Secondary | ICD-10-CM | POA: Diagnosis present

## 2020-10-14 DIAGNOSIS — I10 Essential (primary) hypertension: Secondary | ICD-10-CM | POA: Diagnosis present

## 2020-10-14 DIAGNOSIS — Z79811 Long term (current) use of aromatase inhibitors: Secondary | ICD-10-CM

## 2020-10-14 DIAGNOSIS — M810 Age-related osteoporosis without current pathological fracture: Secondary | ICD-10-CM | POA: Diagnosis present

## 2020-10-14 DIAGNOSIS — M25552 Pain in left hip: Secondary | ICD-10-CM

## 2020-10-14 DIAGNOSIS — S72402A Unspecified fracture of lower end of left femur, initial encounter for closed fracture: Secondary | ICD-10-CM

## 2020-10-14 DIAGNOSIS — Z170 Estrogen receptor positive status [ER+]: Secondary | ICD-10-CM | POA: Diagnosis not present

## 2020-10-14 DIAGNOSIS — K219 Gastro-esophageal reflux disease without esophagitis: Secondary | ICD-10-CM | POA: Diagnosis present

## 2020-10-14 DIAGNOSIS — W010XXA Fall on same level from slipping, tripping and stumbling without subsequent striking against object, initial encounter: Secondary | ICD-10-CM | POA: Diagnosis present

## 2020-10-14 DIAGNOSIS — T1490XA Injury, unspecified, initial encounter: Secondary | ICD-10-CM

## 2020-10-14 DIAGNOSIS — C50412 Malignant neoplasm of upper-outer quadrant of left female breast: Secondary | ICD-10-CM | POA: Diagnosis not present

## 2020-10-14 DIAGNOSIS — E119 Type 2 diabetes mellitus without complications: Secondary | ICD-10-CM | POA: Diagnosis present

## 2020-10-14 DIAGNOSIS — F32A Depression, unspecified: Secondary | ICD-10-CM | POA: Diagnosis present

## 2020-10-14 DIAGNOSIS — Z79899 Other long term (current) drug therapy: Secondary | ICD-10-CM | POA: Diagnosis not present

## 2020-10-14 DIAGNOSIS — M9712XA Periprosthetic fracture around internal prosthetic left knee joint, initial encounter: Secondary | ICD-10-CM | POA: Diagnosis not present

## 2020-10-14 DIAGNOSIS — S72492A Other fracture of lower end of left femur, initial encounter for closed fracture: Principal | ICD-10-CM | POA: Diagnosis present

## 2020-10-14 DIAGNOSIS — Z20822 Contact with and (suspected) exposure to covid-19: Secondary | ICD-10-CM | POA: Diagnosis present

## 2020-10-14 DIAGNOSIS — Z9884 Bariatric surgery status: Secondary | ICD-10-CM | POA: Diagnosis not present

## 2020-10-14 DIAGNOSIS — S72492D Other fracture of lower end of left femur, subsequent encounter for closed fracture with routine healing: Secondary | ICD-10-CM | POA: Diagnosis not present

## 2020-10-14 DIAGNOSIS — D62 Acute posthemorrhagic anemia: Secondary | ICD-10-CM | POA: Diagnosis not present

## 2020-10-14 DIAGNOSIS — S72409A Unspecified fracture of lower end of unspecified femur, initial encounter for closed fracture: Secondary | ICD-10-CM

## 2020-10-14 DIAGNOSIS — S72492B Other fracture of lower end of left femur, initial encounter for open fracture type I or II: Secondary | ICD-10-CM | POA: Diagnosis not present

## 2020-10-14 DIAGNOSIS — T148XXA Other injury of unspecified body region, initial encounter: Secondary | ICD-10-CM

## 2020-10-14 DIAGNOSIS — H409 Unspecified glaucoma: Secondary | ICD-10-CM | POA: Diagnosis present

## 2020-10-14 DIAGNOSIS — K567 Ileus, unspecified: Secondary | ICD-10-CM | POA: Diagnosis not present

## 2020-10-14 DIAGNOSIS — Z853 Personal history of malignant neoplasm of breast: Secondary | ICD-10-CM

## 2020-10-14 DIAGNOSIS — R109 Unspecified abdominal pain: Secondary | ICD-10-CM

## 2020-10-14 DIAGNOSIS — Z87891 Personal history of nicotine dependence: Secondary | ICD-10-CM

## 2020-10-14 DIAGNOSIS — E871 Hypo-osmolality and hyponatremia: Secondary | ICD-10-CM | POA: Diagnosis not present

## 2020-10-14 HISTORY — DX: Unspecified fracture of lower end of left femur, initial encounter for closed fracture: S72.402A

## 2020-10-14 LAB — CBC WITH DIFFERENTIAL/PLATELET
Abs Immature Granulocytes: 0.03 10*3/uL (ref 0.00–0.07)
Abs Immature Granulocytes: 0.05 10*3/uL (ref 0.00–0.07)
Basophils Absolute: 0 10*3/uL (ref 0.0–0.1)
Basophils Absolute: 0 10*3/uL (ref 0.0–0.1)
Basophils Relative: 1 %
Basophils Relative: 1 %
Eosinophils Absolute: 0.1 10*3/uL (ref 0.0–0.5)
Eosinophils Absolute: 0.1 10*3/uL (ref 0.0–0.5)
Eosinophils Relative: 2 %
Eosinophils Relative: 1 %
HCT: 36.6 % (ref 36.0–46.0)
HCT: 37.1 % (ref 36.0–46.0)
Hemoglobin: 12.2 g/dL (ref 12.0–15.0)
Hemoglobin: 12.8 g/dL (ref 12.0–15.0)
Immature Granulocytes: 1 %
Immature Granulocytes: 1 %
Lymphocytes Relative: 27 %
Lymphocytes Relative: 13 %
Lymphs Abs: 1.6 10*3/uL (ref 0.7–4.0)
Lymphs Abs: 1.1 10*3/uL (ref 0.7–4.0)
MCH: 30 pg (ref 26.0–34.0)
MCH: 30.8 pg (ref 26.0–34.0)
MCHC: 33.3 g/dL (ref 30.0–36.0)
MCHC: 34.5 g/dL (ref 30.0–36.0)
MCV: 90.1 fL (ref 80.0–100.0)
MCV: 89.4 fL (ref 80.0–100.0)
Monocytes Absolute: 0.8 10*3/uL (ref 0.1–1.0)
Monocytes Absolute: 0.7 10*3/uL (ref 0.1–1.0)
Monocytes Relative: 13 %
Monocytes Relative: 8 %
Neutro Abs: 3.4 10*3/uL (ref 1.7–7.7)
Neutro Abs: 6.5 10*3/uL (ref 1.7–7.7)
Neutrophils Relative %: 56 %
Neutrophils Relative %: 76 %
Platelets: 274 10*3/uL (ref 150–400)
Platelets: 324 10*3/uL (ref 150–400)
RBC: 4.06 MIL/uL (ref 3.87–5.11)
RBC: 4.15 MIL/uL (ref 3.87–5.11)
RDW: 12.8 % (ref 11.5–15.5)
RDW: 12.8 % (ref 11.5–15.5)
WBC: 5.9 10*3/uL (ref 4.0–10.5)
WBC: 8.4 10*3/uL (ref 4.0–10.5)
nRBC: 0 % (ref 0.0–0.2)
nRBC: 0 % (ref 0.0–0.2)

## 2020-10-14 LAB — COMPREHENSIVE METABOLIC PANEL
ALT: 24 U/L (ref 0–44)
AST: 30 U/L (ref 15–41)
Albumin: 3.9 g/dL (ref 3.5–5.0)
Alkaline Phosphatase: 90 U/L (ref 38–126)
Anion gap: 8 (ref 5–15)
BUN: 21 mg/dL (ref 8–23)
CO2: 30 mmol/L (ref 22–32)
Calcium: 9.1 mg/dL (ref 8.9–10.3)
Chloride: 96 mmol/L — ABNORMAL LOW (ref 98–111)
Creatinine, Ser: 0.76 mg/dL (ref 0.44–1.00)
GFR, Estimated: >60 mL/min (ref >60)
Glucose, Bld: 90 mg/dL (ref 70–99)
Potassium: 4.8 mmol/L (ref 3.5–5.1)
Sodium: 134 mmol/L — ABNORMAL LOW (ref 135–145)
Total Bilirubin: 0.3 mg/dL (ref 0.3–1.2)
Total Protein: 6.9 g/dL (ref 6.5–8.1)

## 2020-10-14 LAB — SARS CORONAVIRUS 2 BY RT PCR (HOSPITAL ORDER, PERFORMED IN ~~LOC~~ HOSPITAL LAB): SARS Coronavirus 2: NEGATIVE

## 2020-10-14 LAB — BASIC METABOLIC PANEL
Anion gap: 9 (ref 5–15)
BUN: 22 mg/dL (ref 8–23)
CO2: 28 mmol/L (ref 22–32)
Calcium: 9.1 mg/dL (ref 8.9–10.3)
Chloride: 92 mmol/L — ABNORMAL LOW (ref 98–111)
Creatinine, Ser: 0.71 mg/dL (ref 0.44–1.00)
GFR, Estimated: >60 mL/min (ref >60)
Glucose, Bld: 139 mg/dL — ABNORMAL HIGH (ref 70–99)
Potassium: 4.1 mmol/L (ref 3.5–5.1)
Sodium: 129 mmol/L — ABNORMAL LOW (ref 135–145)

## 2020-10-14 MED ORDER — MORPHINE SULFATE (PF) 4 MG/ML IV SOLN
4.0000 mg | Freq: Once | INTRAVENOUS | Status: AC
  Administered 2020-10-14: 4 mg via INTRAVENOUS

## 2020-10-14 MED ORDER — MORPHINE SULFATE (PF) 4 MG/ML IV SOLN
INTRAVENOUS | Status: AC
  Filled 2020-10-14: qty 1

## 2020-10-14 MED ORDER — MORPHINE SULFATE (PF) 4 MG/ML IV SOLN
4.0000 mg | Freq: Once | INTRAVENOUS | Status: AC
  Administered 2020-10-14: 4 mg via INTRAVENOUS
  Filled 2020-10-14: qty 1

## 2020-10-14 MED ORDER — ONDANSETRON 4 MG PO TBDP
ORAL_TABLET | ORAL | Status: AC
  Filled 2020-10-14: qty 2

## 2020-10-14 MED ORDER — HYDROMORPHONE HCL 1 MG/ML IJ SOLN
1.0000 mg | Freq: Once | INTRAMUSCULAR | Status: AC
  Administered 2020-10-14: 1 mg via INTRAVENOUS
  Filled 2020-10-14: qty 1

## 2020-10-14 NOTE — ED Triage Notes (Signed)
She tripped over curb and fell this afternoon. She sustained a left knee injury. Hx of knee replacement years ago.

## 2020-10-14 NOTE — ED Notes (Signed)
Pt provided with Bedpan for use. Pt cleaned and dried and comfortable at this time.

## 2020-10-14 NOTE — ED Provider Notes (Signed)
[facility] EMERGENCY DEPARTMENT Provider Note   CSN: [id number] Arrival date & time: 10/14/20  1514     History Chief Complaint  Patient presents with   Fall   Knee Injury    Deborah Cooke is a 70 y.o. female.  HPI 70 year old presents the ER for left knee injury after mechanical fall.  Prior history of knee replacement.  Patient has not mailed bear weight since the accident.  Patient reports tripping over a curb.  No head injury or LOC.  Pain is worse with palpation and range of motion.  Patient has taken no medications for pain prior to arrival.    Past Medical History:  Diagnosis Date   Adverse effect of anesthetic 01/2012   difficulty waking up.   Adverse effect of unspecified anesthetic, initial encounter 08/2011   low o2 sats.   Anemia    Anxiety    Arthritis    Breast cancer ([place]) 02/16/2017   left breast   Chronic pain    Closed supracondylar fracture of right femur ([place]) 09/18/2012   Depression    Family history of breast cancer    GERD (gastroesophageal reflux disease)    Headache(784.0)    Hypothyroidism    Opioid dependence in controlled environment ([place])    Osteoporosis    Osteoporosis with fracture 09/19/2012   Thyroid disease     Patient Active Problem List   Diagnosis Date Noted   Genetic testing 04/21/2017   Family history of breast cancer    Malignant neoplasm of upper-outer quadrant of left breast in female, estrogen receptor positive ([place]) 03/11/2017   Hypertension    Thyroid disease    Osteoporosis    Anxiety    Acute blood loss anemia 09/19/2012   Chronic pain 09/19/2012   Osteoporosis with fracture 09/19/2012   Closed supracondylar fracture of right femur ([place]) 09/18/2012    Past Surgical History:  Procedure Laterality Date   BREAST BIOPSY Left 02/16/2017   BREAST LUMPECTOMY WITH RADIOACTIVE SEED AND SENTINEL LYMPH NODE BIOPSY Left 03/15/2017   Procedure: LEFT BREAST LUMPECTOMY WITH  RADIOACTIVE SEED AND LEFT AXILLARY SENTINEL LYMPH NODE BIOPSY;  Surgeon: [name] Overall, MD;  Location: [facility];  Service: General;  Laterality: Left;   CHOLECYSTECTOMY  10/1975   FEMUR IM NAIL  09/18/2012   Procedure: INTRAMEDULLARY (IM) RETROGRADE FEMORAL NAILING;  Surgeon: [name], MD;  Location: [place];  Service: Orthopedics;  Laterality: Right;   GASTRIC BYPASS  2003   JOINT REPLACEMENT     REPLACEMENT TOTAL KNEE  2003   right and left -4 months aoart.   TONSILLECTOMY     WRIST FRACTURE SURGERY  08/2011     OB History   No obstetric history on file.     Family History  Problem Relation Age of Onset   Breast cancer Mother 47       died at 66   Cancer Father 66       uretherial cancer     Social History   Tobacco Use   Smoking status: Former Smoker    Packs/day: 1.00    Years: 28.00    Pack years: 28.00    Quit date: 01/17/1989    Years since quitting: 31.7   Smokeless tobacco: Former Systems developer    Quit date: 01/17/1989  Vaping Use   Vaping Use: Never used  Substance Use Topics   Alcohol use: Yes    Comment: rarely   Drug use: No  Home Medications Prior to Admission medications   Medication Sig Start Date End Date Taking? Authorizing Provider  AIMOVIG 140 MG/ML SOAJ Inject 140 mg as directed every 30 (thirty) days. 08/07/18   [provider]  anastrozole (ARIMIDEX) 1 MG tablet Take 1 tablet (1 mg total) by mouth daily. 01/14/20   Truitt Merle, MD  B Complex Vitamins (B COMPLEX 100 PO) Take 1 tablet by mouth daily.    [provider]  Biotin 1000 MCG tablet Take 5,000 mcg by mouth daily.     [provider]  Calcium Carbonate (CALCIUM 600 PO) Take 1 tablet by mouth daily.     [provider]  carbamazepine (TEGRETOL XR) 100 MG 12 hr tablet Take by mouth 3 (three) times daily.  07/26/18 07/27/19  [provider]  cholecalciferol (VITAMIN D) 400 units TABS tablet Take 400 Units by mouth daily.     [provider]  ferrous sulfate 325 (65 FE) MG EC tablet Take 325 mg by mouth 3 (three) times daily with meals.    [provider]  gabapentin (NEURONTIN) 800 MG tablet Take 800 mg by mouth 4 (four) times daily.     [provider]  hydrOXYzine (ATARAX/VISTARIL) 50 MG tablet Take 50 mg by mouth daily as needed.  02/18/17   [provider]  latanoprost (XALATAN) 0.005 % ophthalmic solution Place 1 drop into both eyes daily. At night 06/25/16   [provider]  levothyroxine (SYNTHROID, LEVOTHROID) 50 MCG tablet Take 75 mcg by mouth daily.     [provider]  linaclotide Rolan Lipa) 290 MCG CAPS capsule Take 290 mcg by mouth daily before breakfast. Patient not taking: Reported on 04/14/2020    [provider]  liothyronine (CYTOMEL) 25 MCG tablet Take 25 mcg by mouth daily.    [provider]  Multiple Vitamins-Iron (MULTIVITAMIN/IRON PO) Take 1 tablet by mouth daily.     [provider]  omeprazole (PRILOSEC) 40 MG capsule Take 40 mg by mouth daily.    [provider]  oxyCODONE-acetaminophen (PERCOCET) 10-325 MG tablet Take 1 tablet by mouth every 6 (six) hours as needed for pain.    [provider]  potassium chloride (K-DUR,KLOR-CON) 10 MEQ tablet Take 10 mEq by mouth daily.    [provider]  QUEtiapine (SEROQUEL XR) 300 MG 24 hr tablet Take 400 mg by mouth daily.     [provider]  tizanidine (ZANAFLEX) 6 MG capsule Take 6 mg by mouth 3 (three) times daily.    [provider]  vortioxetine HBr (TRINTELLIX) 20 MG TABS tablet Take 20 mg by mouth daily.    [provider]    Allergies    Nsaids, Ibuprofen, and Tolmetin  Review of Systems   Review of Systems  Constitutional: Negative for chills and fever.  Eyes: Negative for discharge.  Respiratory: Negative for cough.   Musculoskeletal: Positive for arthralgias and myalgias.  Skin: Positive for color  change.  Neurological: Negative for syncope, weakness and numbness.  Psychiatric/Behavioral: Negative for confusion.    Physical Exam Updated Vital Signs BP (!) 181/105    Pulse 81    Temp 98.2 F (36.8 C) (Oral)    Resp (!) 24    Ht 5' (1.524 m)    Wt 106.6 kg    SpO2 98%    BMI 45.90 kg/m   Physical Exam Vitals and nursing note reviewed.  Constitutional:      General: She is not in acute  distress.    Appearance: She is well-developed and well-nourished. She is not ill-appearing or toxic-appearing.  HENT:     Head: Normocephalic and atraumatic.  Eyes:     General: No scleral icterus.       Right eye: No discharge.        Left eye: No discharge.  Pulmonary:     Effort: No respiratory distress.  Musculoskeletal:        General: Swelling and tenderness present.     Cervical back: Normal range of motion.     Comments: Patient with ecchymosis over the left knee.  Tender to palpation with swelling noted.  Limited range of motion secondary to pain.  Patient has no obvious deformity.  Skin:    General: Skin is warm and dry.     Capillary Refill: Capillary refill takes less than 2 seconds.     Coloration: Skin is not pale.  Neurological:     Mental Status: She is alert.     Sensory: No sensory deficit.     Motor: No weakness.  Psychiatric:        Behavior: Behavior normal.        Thought Content: Thought content normal.        Judgment: Judgment normal.     ED Results / Procedures / Treatments   Labs (all labs ordered are listed, but only abnormal results are displayed) Labs Reviewed - No data to display  EKG None  Radiology DG Knee Complete 4 Views Left  Result Date: 10/14/2020 CLINICAL DATA:  Fall with pain. EXAM: LEFT KNEE - COMPLETE 4+ VIEW COMPARISON:  12/31/2015 FINDINGS: Previous total knee arthroplasty. Comminuted fracture of the distal femur at the metaphysis. Patella not well demonstrated. No acute fracture of the proximal tibia or fibula. IMPRESSION:  Comminuted fracture of the distal femur at the metaphysis. Patella not well demonstrated. No acute fracture of the proximal tibia or fibula. Electronically Signed   By: Paulina Fusi M.D.   On: 10/14/2020 16:38   DG HIP UNILAT WITH PELVIS MIN 4 VIEWS LEFT  Result Date: 10/14/2020 CLINICAL DATA:  Fall with left-sided pain EXAM: DG HIP (WITH OR WITHOUT PELVIS) 4+V LEFT COMPARISON:  None. FINDINGS: No acute fracture. Chronic osteoarthritis of both hips, left worse than right. Intramedullary nail of the right femur. IMPRESSION: No acute finding. Chronic osteoarthritis of both hips, left worse than right. Electronically Signed   By: Paulina Fusi M.D.   On: 10/14/2020 16:39    Procedures Procedures   Medications Ordered in ED Medications  HYDROmorphone (DILAUDID) injection 1 mg (has no administration in time range)    ED Course  I have reviewed the triage vital signs and the nursing notes.  Pertinent labs & imaging results that were available during my care of the patient were reviewed by me and considered in my medical decision making (see chart for details).    MDM Rules/Calculators/A&P                          70 year old mechanical fall.  Patient has a periprosthetic fracture of the left distal femur.  Patient neurovascularly intact.  X-ray reviewed by myself.  Will need admission and transfer for surgical intervention.  Case discussed with Dr. Sherlean Foot who states that patient will have surgery tomorrow by Dr. Dan Maker.  Case was also discussed with Dr. Arville Care with hospital medicine who agrees to admission. Final Clinical Impression(s) / ED Diagnoses Final diagnoses:  Closed fracture  of distal end of femur, unspecified fracture morphology, unspecified laterality, initial encounter [facility])    Rx / DC Orders ED Discharge Orders    None       Wallace Keller 10/15/20 1133    Rancour, Jeannett Senior, MD 10/15/20 1241

## 2020-10-14 NOTE — ED Notes (Signed)
Vitals delayed r/t attempt at IV

## 2020-10-14 NOTE — ED Notes (Signed)
ED Provider at bedside. 

## 2020-10-15 ENCOUNTER — Inpatient Hospital Stay (HOSPITAL_COMMUNITY): Payer: Medicare Other

## 2020-10-15 ENCOUNTER — Inpatient Hospital Stay (HOSPITAL_COMMUNITY): Payer: Medicare Other | Admitting: Anesthesiology

## 2020-10-15 ENCOUNTER — Encounter (HOSPITAL_COMMUNITY)
Admission: EM | Disposition: A | Discharge status: Skilled Nursing Facility | Payer: Self-pay | Source: Home / Self Care | Attending: Family Medicine

## 2020-10-15 ENCOUNTER — Encounter (HOSPITAL_COMMUNITY): Payer: Self-pay | Admitting: Internal Medicine

## 2020-10-15 ENCOUNTER — Telehealth: Payer: Self-pay | Admitting: Nurse Practitioner

## 2020-10-15 DIAGNOSIS — E079 Disorder of thyroid, unspecified: Secondary | ICD-10-CM | POA: Diagnosis not present

## 2020-10-15 DIAGNOSIS — S72492B Other fracture of lower end of left femur, initial encounter for open fracture type I or II: Secondary | ICD-10-CM | POA: Diagnosis not present

## 2020-10-15 DIAGNOSIS — S72492D Other fracture of lower end of left femur, subsequent encounter for closed fracture with routine healing: Secondary | ICD-10-CM

## 2020-10-15 DIAGNOSIS — I10 Essential (primary) hypertension: Secondary | ICD-10-CM | POA: Diagnosis not present

## 2020-10-15 HISTORY — PX: ORIF FEMUR FRACTURE: SHX2119

## 2020-10-15 HISTORY — PX: OTHER SURGICAL HISTORY: SHX169

## 2020-10-15 LAB — CBC
HCT: 31.1 % — ABNORMAL LOW (ref 36.0–46.0)
Hemoglobin: 10.7 g/dL — ABNORMAL LOW (ref 12.0–15.0)
MCH: 30.8 pg (ref 26.0–34.0)
MCHC: 34.4 g/dL (ref 30.0–36.0)
MCV: 89.6 fL (ref 80.0–100.0)
Platelets: 276 10*3/uL (ref 150–400)
RBC: 3.47 MIL/uL — ABNORMAL LOW (ref 3.87–5.11)
RDW: 12.9 % (ref 11.5–15.5)
WBC: 9.2 10*3/uL (ref 4.0–10.5)
nRBC: 0 % (ref 0.0–0.2)

## 2020-10-15 LAB — TSH: TSH: 2.819 u[IU]/mL (ref 0.350–4.500)

## 2020-10-15 LAB — COMPREHENSIVE METABOLIC PANEL
ALT: 23 U/L (ref 0–44)
AST: 30 U/L (ref 15–41)
Albumin: 3.4 g/dL — ABNORMAL LOW (ref 3.5–5.0)
Alkaline Phosphatase: 78 U/L (ref 38–126)
Anion gap: 10 (ref 5–15)
BUN: 18 mg/dL (ref 8–23)
CO2: 26 mmol/L (ref 22–32)
Calcium: 8.9 mg/dL (ref 8.9–10.3)
Chloride: 97 mmol/L — ABNORMAL LOW (ref 98–111)
Creatinine, Ser: 0.62 mg/dL (ref 0.44–1.00)
GFR, Estimated: >60 mL/min (ref >60)
Glucose, Bld: 135 mg/dL — ABNORMAL HIGH (ref 70–99)
Potassium: 4.2 mmol/L (ref 3.5–5.1)
Sodium: 133 mmol/L — ABNORMAL LOW (ref 135–145)
Total Bilirubin: 0.4 mg/dL (ref 0.3–1.2)
Total Protein: 5.8 g/dL — ABNORMAL LOW (ref 6.5–8.1)

## 2020-10-15 LAB — SURGICAL PCR SCREEN
MRSA, PCR: NEGATIVE
Staphylococcus aureus: NEGATIVE

## 2020-10-15 SURGERY — OPEN REDUCTION INTERNAL FIXATION (ORIF) DISTAL FEMUR FRACTURE
Anesthesia: General | Site: Leg Upper | Laterality: Left

## 2020-10-15 MED ORDER — SENNA 8.6 MG PO TABS
1.0000 | ORAL_TABLET | Freq: Two times a day (BID) | ORAL | Status: DC
  Administered 2020-10-15 – 2020-10-19 (×9): 8.6 mg via ORAL
  Filled 2020-10-15 (×9): qty 1

## 2020-10-15 MED ORDER — CYCLOBENZAPRINE HCL 10 MG PO TABS
10.0000 mg | ORAL_TABLET | Freq: Three times a day (TID) | ORAL | Status: DC | PRN
  Administered 2020-10-15 – 2020-10-16 (×2): 10 mg via ORAL
  Filled 2020-10-15 (×2): qty 1

## 2020-10-15 MED ORDER — ENSURE ENLIVE PO LIQD
237.0000 mL | Freq: Two times a day (BID) | ORAL | Status: DC
  Administered 2020-10-15 – 2020-10-21 (×5): 237 mL via ORAL

## 2020-10-15 MED ORDER — HYDROXYZINE HCL 25 MG PO TABS: 50.0000 mg | ORAL_TABLET | Freq: Every day | ORAL | Status: DC | PRN

## 2020-10-15 MED ORDER — OXYCODONE-ACETAMINOPHEN 5-325 MG PO TABS
1.0000 | ORAL_TABLET | Freq: Four times a day (QID) | ORAL | Status: DC | PRN
  Administered 2020-10-15 – 2020-10-16 (×3): 2 via ORAL
  Filled 2020-10-15 (×3): qty 2

## 2020-10-15 MED ORDER — FERROUS SULFATE 325 (65 FE) MG PO TABS
325.0000 mg | ORAL_TABLET | Freq: Three times a day (TID) | ORAL | Status: DC
  Administered 2020-10-16 – 2020-10-21 (×15): 325 mg via ORAL
  Filled 2020-10-15 (×17): qty 1

## 2020-10-15 MED ORDER — OXYCODONE HCL 5 MG PO TABS: 5.0000 mg | ORAL_TABLET | Freq: Four times a day (QID) | ORAL | Status: DC | PRN

## 2020-10-15 MED ORDER — AMISULPRIDE (ANTIEMETIC) 5 MG/2ML IV SOLN: 10.0000 mg | Freq: Once | INTRAVENOUS | Status: DC | PRN

## 2020-10-15 MED ORDER — SODIUM CHLORIDE 0.9 % IV SOLN: INTRAVENOUS | Status: DC

## 2020-10-15 MED ORDER — LABETALOL HCL 5 MG/ML IV SOLN
5.0000 mg | INTRAVENOUS | Status: DC | PRN
  Administered 2020-10-15: 5 mg via INTRAVENOUS

## 2020-10-15 MED ORDER — FENTANYL CITRATE (PF) 250 MCG/5ML IJ SOLN
INTRAMUSCULAR | Status: AC
  Filled 2020-10-15: qty 5

## 2020-10-15 MED ORDER — LIDOCAINE 2% (20 MG/ML) 5 ML SYRINGE
INTRAMUSCULAR | Status: AC
  Filled 2020-10-15: qty 5

## 2020-10-15 MED ORDER — DEXAMETHASONE SODIUM PHOSPHATE 10 MG/ML IJ SOLN
INTRAMUSCULAR | Status: AC
  Filled 2020-10-15: qty 1

## 2020-10-15 MED ORDER — HYDROMORPHONE HCL 1 MG/ML IJ SOLN
0.2500 mg | INTRAMUSCULAR | Status: DC | PRN
  Administered 2020-10-15 (×2): 0.5 mg via INTRAVENOUS

## 2020-10-15 MED ORDER — OXYCODONE-ACETAMINOPHEN 10-325 MG PO TABS: 1.0000 | ORAL_TABLET | Freq: Four times a day (QID) | ORAL | Status: DC | PRN

## 2020-10-15 MED ORDER — FENTANYL CITRATE (PF) 250 MCG/5ML IJ SOLN
INTRAMUSCULAR | Status: DC | PRN
  Administered 2020-10-15: 50 ug via INTRAVENOUS
  Administered 2020-10-15 (×3): 100 ug via INTRAVENOUS
  Administered 2020-10-15: 150 ug via INTRAVENOUS

## 2020-10-15 MED ORDER — VANCOMYCIN HCL 1000 MG IV SOLR
INTRAVENOUS | Status: DC | PRN
  Administered 2020-10-15: 1000 mg via TOPICAL

## 2020-10-15 MED ORDER — ROCURONIUM BROMIDE 10 MG/ML (PF) SYRINGE
PREFILLED_SYRINGE | INTRAVENOUS | Status: AC
  Filled 2020-10-15: qty 10

## 2020-10-15 MED ORDER — CEFAZOLIN SODIUM-DEXTROSE 2-4 GM/100ML-% IV SOLN
INTRAVENOUS | Status: AC
  Filled 2020-10-15: qty 100

## 2020-10-15 MED ORDER — ROCURONIUM BROMIDE 10 MG/ML (PF) SYRINGE
PREFILLED_SYRINGE | INTRAVENOUS | Status: DC | PRN
  Administered 2020-10-15: 20 mg via INTRAVENOUS
  Administered 2020-10-15: 30 mg via INTRAVENOUS

## 2020-10-15 MED ORDER — PROPOFOL 10 MG/ML IV BOLUS
INTRAVENOUS | Status: DC | PRN
  Administered 2020-10-15: 130 mg via INTRAVENOUS
  Administered 2020-10-15: 70 mg via INTRAVENOUS

## 2020-10-15 MED ORDER — CHLORHEXIDINE GLUCONATE 4 % EX LIQD: 60.0000 mL | Freq: Once | CUTANEOUS | Status: DC

## 2020-10-15 MED ORDER — ONDANSETRON HCL 4 MG/2ML IJ SOLN
INTRAMUSCULAR | Status: DC | PRN
  Administered 2020-10-15: 4 mg via INTRAVENOUS

## 2020-10-15 MED ORDER — VANCOMYCIN HCL 1000 MG IV SOLR
INTRAVENOUS | Status: AC
  Filled 2020-10-15: qty 1000

## 2020-10-15 MED ORDER — TIZANIDINE HCL 4 MG PO TABS
6.0000 mg | ORAL_TABLET | Freq: Three times a day (TID) | ORAL | Status: DC
  Administered 2020-10-15: 6 mg via ORAL
  Filled 2020-10-15: qty 2

## 2020-10-15 MED ORDER — QUETIAPINE FUMARATE ER 400 MG PO TB24
400.0000 mg | ORAL_TABLET | Freq: Every day | ORAL | Status: DC
  Administered 2020-10-16 – 2020-10-20 (×6): 400 mg via ORAL
  Filled 2020-10-15 (×7): qty 1

## 2020-10-15 MED ORDER — HYDROMORPHONE HCL 1 MG/ML IJ SOLN
0.5000 mg | INTRAMUSCULAR | Status: DC | PRN
  Administered 2020-10-15 – 2020-10-16 (×3): 1 mg via INTRAVENOUS
  Administered 2020-10-17: 0.5 mg via INTRAVENOUS
  Filled 2020-10-15 (×4): qty 1

## 2020-10-15 MED ORDER — CHLORHEXIDINE GLUCONATE 0.12 % MT SOLN
15.0000 mL | Freq: Once | OROMUCOSAL | Status: AC
  Filled 2020-10-15: qty 15

## 2020-10-15 MED ORDER — POTASSIUM CHLORIDE CRYS ER 10 MEQ PO TBCR
10.0000 meq | EXTENDED_RELEASE_TABLET | Freq: Every day | ORAL | Status: DC
  Administered 2020-10-18 – 2020-10-21 (×4): 10 meq via ORAL
  Filled 2020-10-15 (×7): qty 1

## 2020-10-15 MED ORDER — PHENYLEPHRINE 40 MCG/ML (10ML) SYRINGE FOR IV PUSH (FOR BLOOD PRESSURE SUPPORT)
PREFILLED_SYRINGE | INTRAVENOUS | Status: AC
  Filled 2020-10-15: qty 10

## 2020-10-15 MED ORDER — CEFAZOLIN SODIUM-DEXTROSE 2-4 GM/100ML-% IV SOLN
2.0000 g | Freq: Three times a day (TID) | INTRAVENOUS | Status: AC
  Administered 2020-10-15 – 2020-10-16 (×3): 2 g via INTRAVENOUS
  Filled 2020-10-15 (×3): qty 100

## 2020-10-15 MED ORDER — ONDANSETRON HCL 4 MG/2ML IJ SOLN
INTRAMUSCULAR | Status: AC
  Filled 2020-10-15: qty 2

## 2020-10-15 MED ORDER — DEXAMETHASONE SODIUM PHOSPHATE 10 MG/ML IJ SOLN
INTRAMUSCULAR | Status: DC | PRN
  Administered 2020-10-15: 5 mg via INTRAVENOUS

## 2020-10-15 MED ORDER — CEFAZOLIN SODIUM-DEXTROSE 2-4 GM/100ML-% IV SOLN
2.0000 g | INTRAVENOUS | Status: AC
  Administered 2020-10-15: 2 g via INTRAVENOUS

## 2020-10-15 MED ORDER — 0.9 % SODIUM CHLORIDE (POUR BTL) OPTIME
TOPICAL | Status: DC | PRN
  Administered 2020-10-15: 1000 mL

## 2020-10-15 MED ORDER — LIOTHYRONINE SODIUM 25 MCG PO TABS
25.0000 ug | ORAL_TABLET | Freq: Every day | ORAL | Status: DC
  Administered 2020-10-16 – 2020-10-21 (×6): 25 ug via ORAL
  Filled 2020-10-15 (×7): qty 1

## 2020-10-15 MED ORDER — HYDROMORPHONE HCL 1 MG/ML IJ SOLN
1.0000 mg | INTRAMUSCULAR | Status: DC | PRN
  Administered 2020-10-15 (×4): 1 mg via INTRAVENOUS
  Filled 2020-10-15 (×5): qty 1

## 2020-10-15 MED ORDER — CHOLECALCIFEROL 10 MCG (400 UNIT) PO TABS
400.0000 [IU] | ORAL_TABLET | Freq: Every day | ORAL | Status: DC
  Administered 2020-10-16 – 2020-10-21 (×6): 400 [IU] via ORAL
  Filled 2020-10-15 (×7): qty 1

## 2020-10-15 MED ORDER — LACTATED RINGERS IV SOLN: INTRAVENOUS | Status: DC | PRN

## 2020-10-15 MED ORDER — CHLORHEXIDINE GLUCONATE 0.12 % MT SOLN
OROMUCOSAL | Status: AC
  Administered 2020-10-15: 15 mL
  Filled 2020-10-15: qty 15

## 2020-10-15 MED ORDER — ANASTROZOLE 1 MG PO TABS
1.0000 mg | ORAL_TABLET | Freq: Every day | ORAL | Status: DC
Start: 2020-10-15 — End: 2020-10-21
  Administered 2020-10-16 – 2020-10-21 (×6): 1 mg via ORAL
  Filled 2020-10-15 (×7): qty 1

## 2020-10-15 MED ORDER — ADULT MULTIVITAMIN W/MINERALS CH
1.0000 | ORAL_TABLET | Freq: Every day | ORAL | Status: DC
  Administered 2020-10-16 – 2020-10-21 (×6): 1 via ORAL
  Filled 2020-10-15 (×6): qty 1

## 2020-10-15 MED ORDER — ACETAMINOPHEN 325 MG PO TABS
325.0000 mg | ORAL_TABLET | Freq: Four times a day (QID) | ORAL | Status: DC | PRN
  Administered 2020-10-17: 650 mg via ORAL
  Filled 2020-10-15: qty 2

## 2020-10-15 MED ORDER — HYDROCODONE-ACETAMINOPHEN 5-325 MG PO TABS: 1.0000 | ORAL_TABLET | Freq: Four times a day (QID) | ORAL | Status: DC | PRN

## 2020-10-15 MED ORDER — ACETAMINOPHEN 500 MG PO TABS
1000.0000 mg | ORAL_TABLET | Freq: Three times a day (TID) | ORAL | Status: DC | PRN
  Administered 2020-10-15: 1000 mg via ORAL
  Filled 2020-10-15: qty 2

## 2020-10-15 MED ORDER — LIDOCAINE 2% (20 MG/ML) 5 ML SYRINGE
INTRAMUSCULAR | Status: DC | PRN
  Administered 2020-10-15: 60 mg via INTRAVENOUS

## 2020-10-15 MED ORDER — OXYCODONE-ACETAMINOPHEN 5-325 MG PO TABS: 1.0000 | ORAL_TABLET | Freq: Four times a day (QID) | ORAL | Status: DC | PRN

## 2020-10-15 MED ORDER — MORPHINE SULFATE (PF) 2 MG/ML IV SOLN: 0.5000 mg | INTRAVENOUS | Status: DC | PRN

## 2020-10-15 MED ORDER — HYDROMORPHONE HCL 1 MG/ML IJ SOLN
INTRAMUSCULAR | Status: AC
  Administered 2020-10-15: 0.5 mg via INTRAVENOUS
  Filled 2020-10-15: qty 1

## 2020-10-15 MED ORDER — SUCCINYLCHOLINE CHLORIDE 200 MG/10ML IV SOSY
PREFILLED_SYRINGE | INTRAVENOUS | Status: AC
  Filled 2020-10-15: qty 10

## 2020-10-15 MED ORDER — POVIDONE-IODINE 10 % EX SWAB
2.0000 "application " | Freq: Once | CUTANEOUS | Status: AC
  Administered 2020-10-15: 2 via TOPICAL

## 2020-10-15 MED ORDER — PANTOPRAZOLE SODIUM 40 MG PO TBEC
80.0000 mg | DELAYED_RELEASE_TABLET | Freq: Every day | ORAL | Status: DC
  Administered 2020-10-15 – 2020-10-21 (×7): 80 mg via ORAL
  Filled 2020-10-15 (×7): qty 2

## 2020-10-15 MED ORDER — GABAPENTIN 400 MG PO CAPS
800.0000 mg | ORAL_CAPSULE | Freq: Four times a day (QID) | ORAL | Status: DC
  Administered 2020-10-15 – 2020-10-21 (×22): 800 mg via ORAL
  Filled 2020-10-15 (×22): qty 2

## 2020-10-15 MED ORDER — SUGAMMADEX SODIUM 200 MG/2ML IV SOLN
INTRAVENOUS | Status: DC | PRN
  Administered 2020-10-15: 200 mg via INTRAVENOUS

## 2020-10-15 MED ORDER — SUCCINYLCHOLINE CHLORIDE 200 MG/10ML IV SOSY
PREFILLED_SYRINGE | INTRAVENOUS | Status: DC | PRN
  Administered 2020-10-15: 120 mg via INTRAVENOUS

## 2020-10-15 MED ORDER — HEPARIN SODIUM (PORCINE) 5000 UNIT/ML IJ SOLN: 5000.0000 [IU] | Freq: Three times a day (TID) | INTRAMUSCULAR | Status: DC

## 2020-10-15 MED ORDER — QUETIAPINE FUMARATE ER 400 MG PO TB24
400.0000 mg | ORAL_TABLET | Freq: Every day | ORAL | Status: DC
  Filled 2020-10-15: qty 1

## 2020-10-15 MED ORDER — VORTIOXETINE HBR 20 MG PO TABS
20.0000 mg | ORAL_TABLET | Freq: Every day | ORAL | Status: DC
  Administered 2020-10-16 – 2020-10-20 (×6): 20 mg via ORAL
  Filled 2020-10-15 (×7): qty 1

## 2020-10-15 MED ORDER — LEVOTHYROXINE SODIUM 75 MCG PO TABS
75.0000 ug | ORAL_TABLET | Freq: Every day | ORAL | Status: DC
  Administered 2020-10-15 – 2020-10-21 (×7): 75 ug via ORAL
  Filled 2020-10-15 (×7): qty 1

## 2020-10-15 MED ORDER — PHENYLEPHRINE 40 MCG/ML (10ML) SYRINGE FOR IV PUSH (FOR BLOOD PRESSURE SUPPORT)
PREFILLED_SYRINGE | INTRAVENOUS | Status: DC | PRN
  Administered 2020-10-15: 40 ug via INTRAVENOUS
  Administered 2020-10-15: 80 ug via INTRAVENOUS

## 2020-10-15 MED ORDER — ZOLPIDEM TARTRATE 5 MG PO TABS
5.0000 mg | ORAL_TABLET | Freq: Every evening | ORAL | Status: DC | PRN
  Administered 2020-10-16: 5 mg via ORAL
  Filled 2020-10-15: qty 1

## 2020-10-15 MED ORDER — VORTIOXETINE HBR 20 MG PO TABS
20.0000 mg | ORAL_TABLET | Freq: Every day | ORAL | Status: DC
  Filled 2020-10-15: qty 1

## 2020-10-15 MED ORDER — LABETALOL HCL 5 MG/ML IV SOLN
INTRAVENOUS | Status: AC
  Administered 2020-10-15: 5 mg via INTRAVENOUS
  Filled 2020-10-15: qty 4

## 2020-10-15 SURGICAL SUPPLY — 78 items
ADH SKN CLS APL DERMABOND .7 (GAUZE/BANDAGES/DRESSINGS) ×2
APL PRP STRL LF DISP 70% ISPRP (MISCELLANEOUS) ×2
BIT DRILL 4.3 (BIT) ×2
BIT DRILL 4.3X300MM (BIT) IMPLANT
BIT DRILL NCB-PP FEMUR MIS 3 (DRILL) IMPLANT
BIT DRILL QC 3.3X195 (BIT) ×1 IMPLANT
BLADE CLIPPER SURG (BLADE) IMPLANT
BNDG CMPR MED 10X6 ELC LF (GAUZE/BANDAGES/DRESSINGS) ×1
BNDG COHESIVE 6X5 TAN STRL LF (GAUZE/BANDAGES/DRESSINGS) ×2 IMPLANT
BNDG ELASTIC 6X10 VLCR STRL LF (GAUZE/BANDAGES/DRESSINGS) ×2 IMPLANT
BRUSH SCRUB EZ PLAIN DRY (MISCELLANEOUS) ×3 IMPLANT
CANISTER SUCT 3000ML PPV (MISCELLANEOUS) ×2 IMPLANT
CAP LOCK NCB (Cap) ×7 IMPLANT
CHLORAPREP W/TINT 26 (MISCELLANEOUS) ×3 IMPLANT
COVER SURGICAL LIGHT HANDLE (MISCELLANEOUS) ×2 IMPLANT
COVER WAND RF STERILE (DRAPES) ×1 IMPLANT
DERMABOND ADVANCED (GAUZE/BANDAGES/DRESSINGS) ×2
DERMABOND ADVANCED .7 DNX12 (GAUZE/BANDAGES/DRESSINGS) IMPLANT
DRAPE C-ARM 42X72 X-RAY (DRAPES) ×2 IMPLANT
DRAPE C-ARMOR (DRAPES) ×2 IMPLANT
DRAPE HALF SHEET 40X57 (DRAPES) ×4 IMPLANT
DRAPE ORTHO SPLIT 77X108 STRL (DRAPES) ×4
DRAPE SURG 17X23 STRL (DRAPES) ×2 IMPLANT
DRAPE SURG ORHT 6 SPLT 77X108 (DRAPES) ×2 IMPLANT
DRAPE U-SHAPE 47X51 STRL (DRAPES) ×2 IMPLANT
DRILL NCB-PP FEMUR MIS 3 (DRILL) ×2
DRSG ADAPTIC 3X8 NADH LF (GAUZE/BANDAGES/DRESSINGS) IMPLANT
DRSG MEPILEX BORDER 4X12 (GAUZE/BANDAGES/DRESSINGS) IMPLANT
DRSG MEPILEX BORDER 4X4 (GAUZE/BANDAGES/DRESSINGS) IMPLANT
DRSG MEPILEX BORDER 4X8 (GAUZE/BANDAGES/DRESSINGS) ×2 IMPLANT
DRSG PAD ABDOMINAL 8X10 ST (GAUZE/BANDAGES/DRESSINGS) ×3 IMPLANT
ELECT REM PT RETURN 9FT ADLT (ELECTROSURGICAL) ×2
ELECTRODE REM PT RTRN 9FT ADLT (ELECTROSURGICAL) ×1 IMPLANT
GAUZE SPONGE 4X4 12PLY STRL (GAUZE/BANDAGES/DRESSINGS) ×1 IMPLANT
GLOVE BIO SURGEON STRL SZ 6.5 (GLOVE) ×6 IMPLANT
GLOVE BIO SURGEON STRL SZ7.5 (GLOVE) ×8 IMPLANT
GLOVE BIOGEL PI IND STRL 7.5 (GLOVE) ×1 IMPLANT
GLOVE BIOGEL PI INDICATOR 7.5 (GLOVE) ×1
GLOVE SURG UNDER POLY LF SZ6.5 (GLOVE) ×2 IMPLANT
GOWN STRL REUS W/ TWL LRG LVL3 (GOWN DISPOSABLE) ×3 IMPLANT
GOWN STRL REUS W/TWL LRG LVL3 (GOWN DISPOSABLE) ×8
K-WIRE 2.0 (WIRE) ×2
K-WIRE FXSTD 280X2XNS SS (WIRE) ×1
KIT BASIN OR (CUSTOM PROCEDURE TRAY) ×2 IMPLANT
KIT TURNOVER KIT B (KITS) ×2 IMPLANT
KWIRE FXSTD 280X2XNS SS (WIRE) IMPLANT
NS IRRIG 1000ML POUR BTL (IV SOLUTION) ×2 IMPLANT
PACK TOTAL JOINT (CUSTOM PROCEDURE TRAY) ×2 IMPLANT
PAD ARMBOARD 7.5X6 YLW CONV (MISCELLANEOUS) ×4 IMPLANT
PAD CAST 4YDX4 CTTN HI CHSV (CAST SUPPLIES) ×1 IMPLANT
PADDING CAST COTTON 4X4 STRL (CAST SUPPLIES)
PADDING CAST COTTON 6X4 STRL (CAST SUPPLIES) ×1 IMPLANT
PADDING CAST SYN 6 (CAST SUPPLIES) ×1
PADDING CAST SYNTHETIC 4 (CAST SUPPLIES) ×1
PADDING CAST SYNTHETIC 4X4 STR (CAST SUPPLIES) IMPLANT
PADDING CAST SYNTHETIC 6X4 NS (CAST SUPPLIES) IMPLANT
PLATE DIST FEM 12H (Plate) ×1 IMPLANT
SCREW 5.0 80MM (Screw) ×1 IMPLANT
SCREW CORTICAL NCB 5.0X40 (Screw) ×2 IMPLANT
SCREW NCB 3.5X75X5X6.2XST (Screw) IMPLANT
SCREW NCB 4.0X40MM (Screw) ×1 IMPLANT
SCREW NCB 5.0X38 (Screw) ×1 IMPLANT
SCREW NCB 5.0X75MM (Screw) ×2 IMPLANT
SCREW NCB 5.0X85MM (Screw) ×3 IMPLANT
SPONGE LAP 18X18 RF (DISPOSABLE) IMPLANT
STAPLER VISISTAT 35W (STAPLE) ×1 IMPLANT
SUCTION FRAZIER HANDLE 10FR (MISCELLANEOUS) ×2
SUCTION TUBE FRAZIER 10FR DISP (MISCELLANEOUS) ×1 IMPLANT
SUT ETHILON 3 0 PS 1 (SUTURE) ×2 IMPLANT
SUT VIC AB 0 CT1 27 (SUTURE) ×2
SUT VIC AB 0 CT1 27XBRD ANBCTR (SUTURE) IMPLANT
SUT VIC AB 1 CT1 27 (SUTURE) ×2
SUT VIC AB 1 CT1 27XBRD ANBCTR (SUTURE) IMPLANT
SUT VIC AB 2-0 CT1 27 (SUTURE) ×2
SUT VIC AB 2-0 CT1 TAPERPNT 27 (SUTURE) ×2 IMPLANT
TOWEL GREEN STERILE (TOWEL DISPOSABLE) ×4 IMPLANT
TRAY FOLEY MTR SLVR 16FR STAT (SET/KITS/TRAYS/PACK) IMPLANT
WATER STERILE IRR 1000ML POUR (IV SOLUTION) ×4 IMPLANT

## 2020-10-15 NOTE — Progress Notes (Addendum)
PROGRESS NOTE  Deborah Cooke [id number] DOB: [birth year] DOA: 10/14/2020 PCP: Rich Fuchs, PA  HPI/Recap of past 24 hours: Deborah Cooke is a 70 y.o. female with medical history significant of anemia, hypothyroidism, HTN, osteoporosis, breast cancer. She say her oncologist today for routine follow-up. Afterward she tripped on a curb and had a hard fall. Subsequently had pain left leg and could not bear wait. EMS transported her to [facility] where x-ray revealed a distal femur fracture left. She is transferred to [facility] for operative repair by Dr. Doreatha Martin. .  ED Course: T 98.3  132/68  HR 106  RR 17. X-ray - left distal comminuted femur fracture. Lab reveals Na 129, glucose 139 CBCD nl, Covid NEGATIVE. EKG with sinus tachycardia.   Subjective: October 15, 2020, Patient seen and examined at bedside he was admitted last night early this morning for fall with fracture of the left distal femur.  Patient is in pain.  She stated she is waiting for her surgery which was scheduled to be at 230 today.  She is n.p.o.  Also complaining of headache.   Assessment/Plan: Active Problems:   Hypertension   Thyroid disease   Closed fracture of left distal femur (HCC)   Type I or II open comminuted intra-articular fracture of distal end of left femur, initial encounter ([place])  1.  Left distal femoral fracture.  Status post OPEN REDUCTION INTERNAL FIXATION (ORIF) DISTAL FEMUR FRACTURE (Left)  2.  Hypertension controlled continue home meds next  3.  Hypothyroidism continue current home dose of levothyroxine and Cytomel  4.  Breast cancer patient is on aromatase continue with her  5.  History of osteoporosis.  Currently not on medication  6.  Type 2 diabetes mellitus Patient does not seem to be admitted on medication we will continue low-carb diet  Code Status: Full  Severity of Illness: The appropriate patient status for this patient is INPATIENT. Inpatient status is judged to be reasonable and  necessary in order to provide the required intensity of service to ensure the patient's safety. The patient's presenting symptoms, physical exam findings, and initial radiographic and laboratory data in the context of their chronic comorbidities is felt to place them at high risk for further clinical deterioration. Furthermore, it is not anticipated that the patient will be medically stable for discharge from the hospital within 2 midnights of admission. The following factors support the patient status of inpatient.   " The patient's presenting symptoms include fall with fracture. " The worrisome physical exam findings include fracture left femur. " The initial radiographic and laboratory data are worrisome because of fracture left femur. " The chronic co-morbidities include hypertension diabetes.   * I certify that at the point of admission it is my clinical judgment that the patient will require inpatient hospital care spanning beyond 2 midnights from the point of admission due to high intensity of service, high risk for further deterioration and high frequency of surveillance required.*    Family Communication: Patient  Disposition Plan: To be determined possibly inpatient rehab   Consultants:  Orthopedic Dr. Doreatha Martin  Procedures: OPEN REDUCTION INTERNAL FIXATION (ORIF) DISTAL FEMUR FRACTURE (Left)  Antimicrobials:  Intraoperative antibiotic  DVT prophylaxis: Heparin   Objective: Vitals:   10/14/20 2120 10/14/20 2235 10/15/20 0453 10/15/20 0818  BP: (!) 143/75 132/68 115/62 110/79  Pulse: (!) 104 (!) 106 96 89  Resp: 16 17 18 14   Temp: 98.6 F (37 C) 98.3 F (36.8 C) 98 F (36.7  C) 98.2 F (36.8 C)  TempSrc: Oral Oral  Oral  SpO2: 96% 97% 97% 95%  Weight:      Height:       No intake or output data in the 24 hours ending 10/15/20 0855 Filed Weights   10/14/20 1544  Weight: 106.6 kg   Body mass index is 45.9 kg/m.  Exam:  . General: 70 y.o. year-old female  well developed well nourished in no acute distress.  Alert and oriented x3.  Obese:In mild distress from pain . Cardiovascular: Regular rate and rhythm with no rubs or gallops.  No thyromegaly or JVD noted.   Marland Kitchen Respiratory: Clear to auscultation with no wheezes or rales. Good inspiratory effort. . Abdomen: Soft nontender nondistended with normal bowel sounds x4 quadrants. . Musculoskeletal: Tender left leg to palpation, no lower extremity edema. 2/4 pulses in all 4 extremities. . Skin: No ulcerative lesions noted or rashes, . Psychiatry: Mood is appropriate for condition and setting    Data Reviewed: CBC: Recent Labs  Lab 10/14/20 1059 10/14/20 1718 10/15/20 0126  WBC 5.9 8.4 9.2  NEUTROABS 3.4 6.5  --   HGB 12.2 12.8 10.7*  HCT 36.6 37.1 31.1*  MCV 90.1 89.4 89.6  PLT 274 324 [id number]   Basic Metabolic Panel: Recent Labs  Lab 10/14/20 1059 10/14/20 1718 10/15/20 0126  NA 134* 129* 133*  K 4.8 4.1 4.2  CL 96* 92* 97*  CO2 30 28 26   GLUCOSE 90 139* 135*  BUN 21 22 18   CREATININE 0.76 0.71 0.62  CALCIUM 9.1 9.1 8.9   GFR: Estimated Creatinine Clearance: 71.2 mL/min (by C-G formula based on SCr of 0.62 mg/dL). Liver Function Tests: Recent Labs  Lab 10/14/20 1059 10/15/20 0126  AST 30 30  ALT 24 23  ALKPHOS 90 78  BILITOT 0.3 0.4  PROT 6.9 5.8*  ALBUMIN 3.9 3.4*   No results for input(s): LIPASE, AMYLASE in the last 168 hours. No results for input(s): AMMONIA in the last 168 hours. Coagulation Profile: No results for input(s): INR, PROTIME in the last 168 hours. Cardiac Enzymes: No results for input(s): CKTOTAL, CKMB, CKMBINDEX, TROPONINI in the last 168 hours. BNP (last 3 results) No results for input(s): PROBNP in the last 8760 hours. HbA1C: No results for input(s): HGBA1C in the last 72 hours. CBG: No results for input(s): GLUCAP in the last 168 hours. Lipid Profile: No results for input(s): CHOL, HDL, LDLCALC, TRIG, CHOLHDL, LDLDIRECT in the last 72  hours. Thyroid Function Tests: Recent Labs    10/15/20 0201  TSH 2.819   Anemia Panel: No results for input(s): VITAMINB12, FOLATE, FERRITIN, TIBC, IRON, RETICCTPCT in the last 72 hours. Urine analysis:    Component Value Date/Time   COLORURINE YELLOW 04/20/2016 1836   APPEARANCEUR CLEAR 04/20/2016 1836   LABSPEC 1.016 04/20/2016 1836   PHURINE 5.5 04/20/2016 1836   GLUCOSEU NEGATIVE 04/20/2016 1836   HGBUR NEGATIVE 04/20/2016 1836   BILIRUBINUR NEGATIVE 04/20/2016 1836   KETONESUR NEGATIVE 04/20/2016 1836   PROTEINUR NEGATIVE 04/20/2016 1836   NITRITE NEGATIVE 04/20/2016 1836   LEUKOCYTESUR NEGATIVE 04/20/2016 1836   Sepsis Labs: @LABRCNTIP (procalcitonin:4,lacticidven:4)  ) Recent Results (from the past 240 hour(s))  SARS Coronavirus 2 by RT PCR (hospital order, performed in [facility] hospital lab) Nasopharyngeal Nasopharyngeal Swab     Status: None   Collection Time: 10/14/20  5:18 PM   Specimen: Nasopharyngeal Swab  Result Value Ref Range Status   SARS Coronavirus 2 NEGATIVE NEGATIVE Final  Comment: (NOTE) SARS-CoV-2 target nucleic acids are NOT DETECTED.  The SARS-CoV-2 RNA is generally detectable in upper and lower respiratory specimens during the acute phase of infection. The lowest concentration of SARS-CoV-2 viral copies this assay can detect is 250 copies / mL. A negative result does not preclude SARS-CoV-2 infection and should not be used as the sole basis for treatment or other patient management decisions.  A negative result may occur with improper specimen collection / handling, submission of specimen other than nasopharyngeal swab, presence of viral mutation(s) within the areas targeted by this assay, and inadequate number of viral copies (<250 copies / mL). A negative result must be combined with clinical observations, patient history, and epidemiological information.  Fact Sheet for Patients:   StrictlyIdeas.no  Fact  Sheet for Healthcare Providers: BankingDealers.co.za  This test is not yet approved or  cleared by the [place] FDA and has been authorized for detection and/or diagnosis of SARS-CoV-2 by FDA under an Emergency Use Authorization (EUA).  This EUA will remain in effect (meaning this test can be used) for the duration of the COVID-19 declaration under Section 564(b)(1) of the Act, 21 U.S.C. section 360bbb-3(b)(1), unless the authorization is terminated or revoked sooner.  Performed at [facility], [address] [place], [place] [address]   Surgical pcr screen     Status: None   Collection Time: 10/15/20  1:27 AM   Specimen: Nasal Mucosa; Nasal Swab  Result Value Ref Range Status   MRSA, PCR NEGATIVE NEGATIVE Final   Staphylococcus aureus NEGATIVE NEGATIVE Final    Comment: (NOTE) The Xpert SA Assay (FDA approved for NASAL specimens in patients 18 years of age and older), is one component of a comprehensive surveillance program. It is not intended to diagnose infection nor to guide or monitor treatment. Performed at [facility], [place] [address] [place], [place] [address]       Studies: Chest Portable 1 View  Result Date: 10/15/2020 CLINICAL DATA:  Femoral fracture EXAM: PORTABLE CHEST 1 VIEW COMPARISON:  04/20/2016 FINDINGS: Heart size and pulmonary vascularity are normal for technique. Interstitial pattern to the lungs is similar to prior study, likely fibrosis. No airspace disease or consolidation. No pleural effusions. No pneumothorax. Mediastinal contours appear intact. 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 No evidence of active pulmonary disease. Electronically Signed   By: Lucienne Capers M.D.   On: 10/15/2020 03:48   DG Knee Complete 4 Views Left  Result Date: 10/14/2020 CLINICAL DATA:  Fall with pain. EXAM: LEFT KNEE - COMPLETE 4+ VIEW COMPARISON:  12/31/2015 FINDINGS: Previous total knee arthroplasty. Comminuted  fracture of the distal femur at the metaphysis. Patella not well demonstrated. No acute fracture of the proximal tibia or fibula. IMPRESSION: Comminuted fracture of the distal femur at the metaphysis. Patella not well demonstrated. No acute fracture of the proximal tibia or fibula. Electronically Signed   By: Nelson Chimes M.D.   On: 10/14/2020 16:38   DG HIP UNILAT WITH PELVIS MIN 4 VIEWS LEFT  Result Date: 10/14/2020 CLINICAL DATA:  Fall with left-sided pain EXAM: DG HIP (WITH OR WITHOUT PELVIS) 4+V LEFT COMPARISON:  None. FINDINGS: No acute fracture. Chronic osteoarthritis of both hips, left worse than right. Intramedullary nail of the right femur. IMPRESSION: No acute finding. Chronic osteoarthritis of both hips, left worse than right. Electronically Signed   By: Nelson Chimes M.D.   On: 10/14/2020 16:39    Scheduled Meds: . anastrozole  1 mg Oral  Daily  . cholecalciferol  400 Units Oral Daily  . ferrous sulfate  325 mg Oral TID WC  . gabapentin  800 mg Oral QID  . heparin  5,000 Units Subcutaneous Q8H  . levothyroxine  75 mcg Oral Q0600  . liothyronine  25 mcg Oral Daily  . pantoprazole  80 mg Oral Daily  . potassium chloride  10 mEq Oral Daily  . QUEtiapine  400 mg Oral Daily  . senna  1 tablet Oral BID  . tiZANidine  6 mg Oral TID  . vortioxetine HBr  20 mg Oral Daily    Continuous Infusions:   LOS: 1 day     Cristal Deer, MD [facility]  To reach me or the doctor on call, go to: [email or web address] Password [facility]  10/15/2020, 8:55 AM

## 2020-10-15 NOTE — Progress Notes (Signed)
Initial Nutrition Assessment  DOCUMENTATION CODES:   Morbid obesity  INTERVENTION:    When diet advanced, offer Ensure Enlive po BID, each supplement provides 350 kcal and 20 grams of protein  MVI with minerals daily  NUTRITION DIAGNOSIS:   Increased nutrient needs related to hip fracture,post-op healing as evidenced by estimated needs.  GOAL:   Patient will meet greater than or equal to 90% of their needs  MONITOR:   PO intake,Supplement acceptance  REASON FOR ASSESSMENT:   Consult Hip fracture protocol  ASSESSMENT:   70 yo female admitted with L distal femur fracture s/p fall. PMH includes anemia, breast cancer, GERD, hypothyroidism, osteoporosis.   Patient is currently NPO for surgical repair of hip fracture today. No nutrition problems PTA per review of chart. Weight has been stable for the past 12 months.  Patient reports good appetite and intake PTA.  Labs reviewed. Sodium 133  Medications reviewed and include cholecalciferol, ferrous sulfate, Klor-Con, Senokot.   NUTRITION - FOCUSED PHYSICAL EXAM:  Flowsheet Row Most Recent Value  Orbital Region No depletion  Upper Arm Region No depletion  Thoracic and Lumbar Region No depletion  Buccal Region No depletion  Temple Region No depletion  Clavicle Bone Region No depletion  Clavicle and Acromion Bone Region No depletion  Scapular Bone Region No depletion  Dorsal Hand No depletion  Patellar Region No depletion  Anterior Thigh Region No depletion  Posterior Calf Region No depletion  Edema (RD Assessment) None  Hair Reviewed  Eyes Reviewed  Mouth Reviewed  Skin Reviewed  Nails Reviewed       Diet Order:   Diet Order            Diet NPO time specified  Diet effective now                 EDUCATION NEEDS:   No education needs have been identified at this time  Skin:  Skin Assessment: Reviewed RN Assessment  Last BM:  1/25  Height:   Ht Readings from Last 1 Encounters:  10/14/20 5'  (1.524 m)    Weight:   Wt Readings from Last 1 Encounters:  10/14/20 106.6 kg    Ideal Body Weight:  45.5 kg  BMI:  Body mass index is 45.9 kg/m.  Estimated Nutritional Needs:   Kcal:  1600-1800  Protein:  90-110 gm  Fluid:  >/= 1.8 L    Lucas Mallow, RD, LDN, CNSC Please refer to Amion for contact information.

## 2020-10-15 NOTE — Progress Notes (Signed)
OT Cancellation Note  Patient Details Name: Deborah Cooke MRN: [id number] DOB: 1951/09/01   Cancelled Treatment:    Reason Eval/Treat Not Completed: Patient not medically ready. Pt on  bedrest, going to OR for surgery this afternoon. OT will follow up as appropriate  Britt Bottom 10/15/2020, 10:51 AM

## 2020-10-15 NOTE — Anesthesia Procedure Notes (Signed)
Procedure Name: Intubation Date/Time: 10/15/2020 4:04 PM Performed by: Barrington Ellison, CRNA Pre-anesthesia Checklist: Patient identified, Emergency Drugs available, Suction available and Patient being monitored Patient Re-evaluated:Patient Re-evaluated prior to induction Oxygen Delivery Method: Circle System Utilized Preoxygenation: Pre-oxygenation with 100% oxygen Induction Type: IV induction Ventilation: Mask ventilation without difficulty Laryngoscope Size: Mac and 3 Grade View: Grade I Tube type: Oral Tube size: 7.0 mm Number of attempts: 1 Airway Equipment and Method: Stylet and Oral airway Placement Confirmation: ETT inserted through vocal cords under direct vision,  positive ETCO2 and breath sounds checked- equal and bilateral Secured at: 20 cm Tube secured with: Tape Dental Injury: Teeth and Oropharynx as per pre-operative assessment

## 2020-10-15 NOTE — Op Note (Signed)
Orthopaedic Surgery Operative Note (CSN: [id number] ) Date of Surgery: 10/15/2020  Admit Date: 10/14/2020   Diagnoses: Pre-Op Diagnoses: Left periprosthetic distal femur fracture   Post-Op Diagnosis: Same  Procedures: CPT 27511-Open reduction internal fixation of left distal femur fracture  Surgeons : Primary: Shona Needles, MD  Assistant: Patrecia Pace, PA-C  Location: OR 4   Anesthesia:General   Antibiotics:Ancef 2 gm preop with 1 gm vancomycin powder placed topically  Tourniquet time:None    Estimated Blood BDZH:299 mL  Complications:None  Specimens:None   Implants: Implant Name Type Inv. Item Serial No. Manufacturer Lot No. LRB No. Used Action  PLATE DIST FEM 24Q - [id number] Plate PLATE DIST FEM 29N  [employer](ORTH,TRAU,BIO,SG)  Left 1 Implanted  SCREW NCB 5.0X85MM - [id number] Screw SCREW NCB 5.0X85MM  [employer](ORTH,TRAU,BIO,SG)  Left 3 Implanted  SCREW NCB 5.0X75MM - [id number] Screw SCREW NCB 5.0X75MM  [employer](ORTH,TRAU,BIO,SG)  Left 1 Implanted  SCREW CORTICAL NCB 5.0X40 - [id number] Screw SCREW CORTICAL NCB 5.0X40  [employer](ORTH,TRAU,BIO,SG)  Left 2 Implanted  SCREW NCB 5.0X38 - [id number] Screw SCREW NCB 5.0X38  [employer](ORTH,TRAU,BIO,SG)  Left 1 Implanted  SCREW 5.0 80MM - [id number] Screw SCREW 5.0 80MM  Z[employer]ON(ORTH,TRAU,BIO,SG)  Left 1 Implanted  SCREW NCB 4.0X40MM - [id number] Screw SCREW NCB 4.0X40MM  [employer](ORTH,TRAU,BIO,SG)  Left 1 Implanted  CAP LOCK NCB - [id number] Cap CAP LOCK NCB  [employer](ORTH,TRAU,BIO,SG)  Left 7 Implanted     Indications for Surgery: 70 year old female who tripped and sustained a left periprosthetic distal femur fracture.  Due to the unstable nature of her injury I recommended proceeding with open reduction internal fixation.  Risks and benefits were discussed with the patient.  Risks include but not limited to bleeding, infection, malunion, nonunion, hardware failure, hardware irritation, nerve or  blood vessel injury, DVT, even the possibility anesthetic complications.  Patient agreed to proceed with surgery and consent was obtained.  Operative Findings: Open reduction internal fixation of left periprosthetic distal femur fracture using [employer] NCB distal femoral locking plate  Procedure: The patient was identified in the preoperative holding area. Consent was confirmed with the patient and their family and all questions were answered. The operative extremity was marked after confirmation with the patient. she was then brought back to the operating room by our anesthesia colleagues.  She was placed under general anesthetic and carefully transferred over to a radiolucent flat top table.  A bump was placed under her operative hip.  The left lower extremity was then prepped and draped in usual sterile fashion.  A timeout was performed to verify the patient, procedure, and the extremity.  Preoperative antibiotics were dosed.  Fluoroscopic imaging was obtained to show the unstable nature of her injury.  The hip and knee were flexed over a triangle.  A direct lateral approach to the distal femur was carried down through skin and subcutaneous tissue.  The IT band was released in line with the incision.  I then mobilized the soft tissue off of the lateral cortex of the femur.  The prosthesis was impacted into the metaphysis of the distal femur.  I used a Cobb elevator to enter the fracture and mobilized the prosthesis out of extension and into reduction.  Traction was applied to correct the coronal alignment.  [employer] NCB 12 hole distal femoral locking plate was attached to the targeting arm and slid submuscularly along the lateral cortex of the femur.  A 2.0 mm K wire was placed distally.  A  percutaneous 3.3 mm drill bit was placed in the proximal hole of the plate to align it appropriately proximally.  I then placed 5.0 millimeter screws distally to bring the plate flush to bone after I  confirmed positioning and reduction of the fracture.  Percutaneously placed 5.0 millimeter screws were placed into the femoral shaft.  The 3.3 mm drill bit was removed and replaced with a 4.0 millimeter screw.  Locking caps were placed on the 5.0 millimeter screws into the femoral shaft.  I returned to the distal segment and proceeded to place a total of 5 screws distally.  Locking caps were placed on all the screws.  Final fluoroscopic imaging was obtained.  The incision was copiously irrigated.  A gram of vancomycin powder was placed into the incision.  Layered closure of 0 Vicryl, 2-0 Vicryl, 3-0 Monocryl and Dermabond was used to close the skin.  Sterile dressing was placed.  The patient was awoken from anesthesia and taken to the PACU in stable condition.   Post Op Plan/Instructions: Patient will be touchdown weightbearing to left lower extremity.  She will receive postoperative Ancef.  She will receive Lovenox for DVT prophylaxis.  We will have her mobilize with physical and Occupational Therapy.  I was present and performed the entire surgery.  Patrecia Pace, PA-C did assist me throughout the case. An assistant was necessary given the difficulty in approach, maintenance of reduction and ability to instrument the fracture.   Katha Hamming, MD [facility]

## 2020-10-15 NOTE — Anesthesia Preprocedure Evaluation (Addendum)
Anesthesia Evaluation  Patient identified by MRN, date of birth, ID band Patient awake    Reviewed: Allergy & Precautions, NPO status , Patient's Chart, lab work & pertinent test results, reviewed documented beta blocker date and time   History of Anesthesia Complications Negative for: history of anesthetic complications  Airway Mallampati: III  TM Distance: >3 FB Neck ROM: Limited   Comment: Very limited neck extension s/p C spine surgery Dental  (+) Edentulous Upper, Edentulous Lower   Pulmonary former smoker,  28 pack year history  Quit smoking 1990   Pulmonary exam normal breath sounds clear to auscultation       Cardiovascular hypertension, Pt. on medications and Pt. on home beta blockers Normal cardiovascular exam Rhythm:Regular Rate:Normal     Neuro/Psych  Headaches, PSYCHIATRIC DISORDERS Anxiety Depression    GI/Hepatic Neg liver ROS, GERD  Controlled,Gastric bypass 2003   Endo/Other  Hypothyroidism   Renal/GU negative Renal ROS  negative genitourinary   Musculoskeletal  (+) Arthritis , Osteoarthritis,    Abdominal (+) + obese,   Peds  Hematology  (+) Blood dyscrasia, anemia , hct 31.1   Anesthesia Other Findings   Reproductive/Obstetrics negative OB ROS                            Anesthesia Physical Anesthesia Plan  ASA: III  Anesthesia Plan: General   Post-op Pain Management:    Induction: Intravenous  PONV Risk Score and Plan: 3 and Ondansetron, Dexamethasone and Treatment may vary due to age or medical condition  Airway Management Planned: Oral ETT  Additional Equipment: None  Intra-op Plan:   Post-operative Plan: Extubation in OR  Informed Consent: I have reviewed the patients History and Physical, chart, labs and discussed the procedure including the risks, benefits and alternatives for the proposed anesthesia with the patient or authorized representative who  has indicated his/her understanding and acceptance.     Dental advisory given  Plan Discussed with: CRNA  Anesthesia Plan Comments: (D/w patient spinal vs GA, prefers GA)       Anesthesia Quick Evaluation

## 2020-10-15 NOTE — Anesthesia Postprocedure Evaluation (Signed)
Anesthesia Post Note  Patient: Deborah Cooke  Procedure(s) Performed: OPEN REDUCTION INTERNAL FIXATION (ORIF) DISTAL FEMUR FRACTURE (Left Leg Upper)     Patient location during evaluation: PACU Anesthesia Type: General Level of consciousness: sedated Pain management: pain level controlled Vital Signs Assessment: post-procedure vital signs reviewed and stable Respiratory status: spontaneous breathing and respiratory function stable Cardiovascular status: stable Postop Assessment: no apparent nausea or vomiting Anesthetic complications: no   No complications documented.  Last Vitals:  Vitals:   10/15/20 1836 10/15/20 1852  BP: (!) 148/89 (!) 152/84  Pulse: 92 92  Resp: 12 15  Temp:  37 C  SpO2:  96%    Last Pain:  Vitals:   10/15/20 1852  TempSrc: Oral  PainSc: 7                  Jarell Mcewen DANIEL

## 2020-10-15 NOTE — Consult Note (Addendum)
Reason for Consult:Left distal femur fx Referring Physician: Milas Gain Time [id number] Time at bedside: Deborah Cooke is an 70 y.o. female.  HPI: Deborah Cooke was out yesterday and tripped on a curb. She went down onto her knees hard. She had immediate pain, especially in the left one. She could not get up or bear weight. She was brought to the ED where x-rays showed a periprosthetic distal femur fx and orthopedic surgery was consulted. She c/o localized pain to the area. She lives at home with her husband but he's gone a lot as an Print production planner. Her M-I-L is coming to stay with her. She has been ambulating with a cane of late due to OA of the left hip.  Past Medical History:  Diagnosis Date  . Adverse effect of anesthetic 01/2012   difficulty waking up.  . Adverse effect of unspecified anesthetic, initial encounter 08/2011   low o2 sats.  . Anemia   . Anxiety   . Arthritis   . Breast cancer ([place]) 02/16/2017   left breast  . Chronic pain   . Closed supracondylar fracture of right femur ([place]) 09/18/2012  . Depression   . Family history of breast cancer   . GERD (gastroesophageal reflux disease)   . Headache(784.0)   . Hypothyroidism   . Opioid dependence in controlled environment ([place])   . Osteoporosis   . Osteoporosis with fracture 09/19/2012  . Thyroid disease     Past Surgical History:  Procedure Laterality Date  . BREAST BIOPSY Left 02/16/2017  . BREAST LUMPECTOMY WITH RADIOACTIVE SEED AND SENTINEL LYMPH NODE BIOPSY Left 03/15/2017   Procedure: LEFT BREAST LUMPECTOMY WITH RADIOACTIVE SEED AND LEFT AXILLARY SENTINEL LYMPH NODE BIOPSY;  Surgeon: [name] Overall, MD;  Location: [facility];  Service: General;  Laterality: Left;  . CHOLECYSTECTOMY  10/1975  . FEMUR IM NAIL  09/18/2012   Procedure: INTRAMEDULLARY (IM) RETROGRADE FEMORAL NAILING;  Surgeon: [name], MD;  Location: [facility];  Service: Orthopedics;  Laterality: Right;  . GASTRIC BYPASS  2003  .  JOINT REPLACEMENT    . REPLACEMENT TOTAL KNEE  2003   right and left -4 months aoart.  . TONSILLECTOMY    . WRIST FRACTURE SURGERY  08/2011    Family History  Problem Relation Age of Onset  . Breast cancer Mother 60       died at 13  . Cancer Father 4       uretherial cancer     Social History:  reports that she quit smoking about 31 years ago. She has a 28.00 pack-year smoking history. She quit smokeless tobacco use about 31 years ago. She reports current alcohol use. She reports that she does not use drugs.  Allergies:  Allergies  Allergen Reactions  . Nsaids Nausea And Vomiting and Other (See Comments)    GI Upset (ibuprofen included)  . Ibuprofen Other (See Comments)    Other reaction(s): Abdominal Pain  . Tolmetin Other (See Comments)    Other reaction(s): Abdominal Pain     Medications: I have reviewed the patient's current medications.  Results for orders placed or performed during the hospital encounter of 10/14/20 (from the past 48 hour(s))  CBC with Differential/Platelet     Status: None   Collection Time: 10/14/20  5:18 PM  Result Value Ref Range   WBC 8.4 4.0 - 10.5 K/uL   RBC 4.15 3.87 - 5.11 MIL/uL   Hemoglobin 12.8 12.0 - 15.0  g/dL   HCT 37.1 36.0 - 46.0 %   MCV 89.4 80.0 - 100.0 fL   MCH 30.8 26.0 - 34.0 pg   MCHC 34.5 30.0 - 36.0 g/dL   RDW 12.8 11.5 - 15.5 %   Platelets 324 150 - 400 K/uL   nRBC 0.0 0.0 - 0.2 %   Neutrophils Relative % 76 %   Neutro Abs 6.5 1.7 - 7.7 K/uL   Lymphocytes Relative 13 %   Lymphs Abs 1.1 0.7 - 4.0 K/uL   Monocytes Relative 8 %   Monocytes Absolute 0.7 0.1 - 1.0 K/uL   Eosinophils Relative 1 %   Eosinophils Absolute 0.1 0.0 - 0.5 K/uL   Basophils Relative 1 %   Basophils Absolute 0.0 0.0 - 0.1 K/uL   Immature Granulocytes 1 %   Abs Immature Granulocytes 0.05 0.00 - 0.07 K/uL    Comment: Performed at [facility], [address] [place], [place] [id number]  Basic metabolic panel     Status: Abnormal    Collection Time: 10/14/20  5:18 PM  Result Value Ref Range   Sodium 129 (L) 135 - 145 mmol/L   Potassium 4.1 3.5 - 5.1 mmol/L   Chloride 92 (L) 98 - 111 mmol/L   CO2 28 22 - 32 mmol/L   Glucose, Bld 139 (H) 70 - 99 mg/dL    Comment: Glucose reference range applies only to samples taken after fasting for at least 8 hours.   BUN 22 8 - 23 mg/dL   Creatinine, Ser 0.71 0.44 - 1.00 mg/dL   Calcium 9.1 8.9 - 10.3 mg/dL   GFR, Estimated >60 >60 mL/min    Comment: (NOTE) Calculated using the CKD-EPI Creatinine Equation (2021)    Anion gap 9 5 - 15    Comment: Performed at [facility], [address] [place], [place] [address]  SARS Coronavirus 2 by RT PCR (hospital order, performed in [facility] hospital lab) Nasopharyngeal Nasopharyngeal Swab     Status: None   Collection Time: 10/14/20  5:18 PM   Specimen: Nasopharyngeal Swab  Result Value Ref Range   SARS Coronavirus 2 NEGATIVE NEGATIVE    Comment: (NOTE) SARS-CoV-2 target nucleic acids are NOT DETECTED.  The SARS-CoV-2 RNA is generally detectable in upper and lower respiratory specimens during the acute phase of infection. The lowest concentration of SARS-CoV-2 viral copies this assay can detect is 250 copies / mL. A negative result does not preclude SARS-CoV-2 infection and should not be used as the sole basis for treatment or other patient management decisions.  A negative result may occur with improper specimen collection / handling, submission of specimen other than nasopharyngeal swab, presence of viral mutation(s) within the areas targeted by this assay, and inadequate number of viral copies (<250 copies / mL). A negative result must be combined with clinical observations, patient history, and epidemiological information.  Fact Sheet for Patients:   StrictlyIdeas.no  Fact Sheet for Healthcare Providers: BankingDealers.co.za  This test is not yet approved or   cleared by the [place] FDA and has been authorized for detection and/or diagnosis of SARS-CoV-2 by FDA under an Emergency Use Authorization (EUA).  This EUA will remain in effect (meaning this test can be used) for the duration of the COVID-19 declaration under Section 564(b)(1) of the Act, 21 U.S.C. section 360bbb-3(b)(1), unless the authorization is terminated or revoked sooner.  Performed at [facility], [address] [place], [place]  [address]   CBC     Status: Abnormal   Collection Time: 10/15/20  1:26 AM  Result Value Ref Range   WBC 9.2 4.0 - 10.5 K/uL   RBC 3.47 (L) 3.87 - 5.11 MIL/uL   Hemoglobin 10.7 (L) 12.0 - 15.0 g/dL   HCT 31.1 (L) 36.0 - 46.0 %   MCV 89.6 80.0 - 100.0 fL   MCH 30.8 26.0 - 34.0 pg   MCHC 34.4 30.0 - 36.0 g/dL   RDW 12.9 11.5 - 15.5 %   Platelets 276 150 - 400 K/uL   nRBC 0.0 0.0 - 0.2 %    Comment: Performed at [facility], [place] [address] [place], [place] [address]  Comprehensive metabolic panel     Status: Abnormal   Collection Time: 10/15/20  1:26 AM  Result Value Ref Range   Sodium 133 (L) 135 - 145 mmol/L   Potassium 4.2 3.5 - 5.1 mmol/L   Chloride 97 (L) 98 - 111 mmol/L   CO2 26 22 - 32 mmol/L   Glucose, Bld 135 (H) 70 - 99 mg/dL    Comment: Glucose reference range applies only to samples taken after fasting for at least 8 hours.   BUN 18 8 - 23 mg/dL   Creatinine, Ser 0.62 0.44 - 1.00 mg/dL   Calcium 8.9 8.9 - 10.3 mg/dL   Total Protein 5.8 (L) 6.5 - 8.1 g/dL   Albumin 3.4 (L) 3.5 - 5.0 g/dL   AST 30 15 - 41 U/L   ALT 23 0 - 44 U/L   Alkaline Phosphatase 78 38 - 126 U/L   Total Bilirubin 0.4 0.3 - 1.2 mg/dL   GFR, Estimated >60 >60 mL/min    Comment: (NOTE) Calculated using the CKD-EPI Creatinine Equation (2021)    Anion gap 10 5 - 15    Comment: Performed at [facility], [place] [address] [place], [place] [address]  Surgical pcr screen     Status: None   Collection Time: 10/15/20  1:27 AM    Specimen: Nasal Mucosa; Nasal Swab  Result Value Ref Range   MRSA, PCR NEGATIVE NEGATIVE   Staphylococcus aureus NEGATIVE NEGATIVE    Comment: (NOTE) The Xpert SA Assay (FDA approved for NASAL specimens in patients 57 years of age and older), is one component of a comprehensive surveillance program. It is not intended to diagnose infection nor to guide or monitor treatment. Performed at [facility], [place] [address], [place], [place] [address]   TSH     Status: None   Collection Time: 10/15/20  2:01 AM  Result Value Ref Range   TSH 2.819 0.350 - 4.500 uIU/mL    Comment: Performed by a 3rd Generation assay with a functional sensitivity of <=0.01 uIU/mL. Performed at [facility], [place] [address] [place], [place] [address]     Chest Portable 1 View  Result Date: 10/15/2020 CLINICAL DATA:  Femoral fracture EXAM: PORTABLE CHEST 1 VIEW COMPARISON:  04/20/2016 FINDINGS: Heart size and pulmonary vascularity are normal for technique. Interstitial pattern to the lungs is similar to prior study, likely fibrosis. No airspace disease or consolidation. No pleural effusions. No pneumothorax. Mediastinal contours appear intact. 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 No evidence of active pulmonary disease. Electronically Signed   By: Lucienne Capers M.D.   On: 10/15/2020 03:48   DG Knee Complete 4 Views Left  Result Date: 10/14/2020 CLINICAL DATA:  Fall with pain. EXAM: LEFT KNEE -  COMPLETE 4+ VIEW COMPARISON:  12/31/2015 FINDINGS: Previous total knee arthroplasty. Comminuted fracture of the distal femur at the metaphysis. Patella not well demonstrated. No acute fracture of the proximal tibia or fibula. IMPRESSION: Comminuted fracture of the distal femur at the metaphysis. Patella not well demonstrated. No acute fracture of the proximal tibia or fibula. Electronically Signed   By: Nelson Chimes M.D.   On: 10/14/2020 16:38   DG HIP UNILAT WITH PELVIS MIN 4 VIEWS LEFT  Result Date:  10/14/2020 CLINICAL DATA:  Fall with left-sided pain EXAM: DG HIP (WITH OR WITHOUT PELVIS) 4+V LEFT COMPARISON:  None. FINDINGS: No acute fracture. Chronic osteoarthritis of both hips, left worse than right. Intramedullary nail of the right femur. IMPRESSION: No acute finding. Chronic osteoarthritis of both hips, left worse than right. Electronically Signed   By: Nelson Chimes M.D.   On: 10/14/2020 16:39    Review of Systems  HENT: Negative for ear discharge, ear pain, hearing loss and tinnitus.   Eyes: Negative for photophobia and pain.  Respiratory: Negative for cough and shortness of breath.   Cardiovascular: Negative for chest pain.  Gastrointestinal: Negative for abdominal pain, nausea and vomiting.  Genitourinary: Negative for dysuria, flank pain, frequency and urgency.  Musculoskeletal: Positive for arthralgias (Left knee). Negative for back pain, myalgias and neck pain.  Neurological: Negative for dizziness and headaches.  Hematological: Does not bruise/bleed easily.  Psychiatric/Behavioral: The patient is not nervous/anxious.    Blood pressure 110/79, pulse 89, temperature 98.2 F (36.8 C), temperature source Oral, resp. rate 14, height 5' (1.524 m), weight 106.6 kg, SpO2 95 %. Physical Exam Constitutional:      General: She is not in acute distress.    Appearance: She is well-developed and well-nourished. She is not diaphoretic.  HENT:     Head: Normocephalic and atraumatic.  Eyes:     General: No scleral icterus.       Right eye: No discharge.        Left eye: No discharge.     Conjunctiva/sclera: Conjunctivae normal.  Cardiovascular:     Rate and Rhythm: Normal rate and regular rhythm.  Pulmonary:     Effort: Pulmonary effort is normal. No respiratory distress.  Musculoskeletal:     Cervical back: Normal range of motion.     Comments: LLE No traumatic wounds or rash, knee ecchymotic  Severe TTP knee  No ankle effusion  Sens DPN, SPN, TN intact  Motor EHL, ext, flex,  evers 5/5  DP 1+, PT 0, No significant edema  Skin:    General: Skin is warm and dry.  Neurological:     Mental Status: She is alert.  Psychiatric:        Mood and Affect: Mood and affect normal.        Behavior: Behavior normal.     Assessment/Plan: Left distal femur fx -- Plan ORIF today by Dr. Doreatha Martin. Please keep NPO. Multiple medical problems including anemia, hypothyroidism, HTN, osteoporosis, breast cancer, and hip OA -- per primary service    Lisette Abu, PA-C Orthopedic Surgery [phone] 10/15/2020, 9:22 AM

## 2020-10-15 NOTE — Interval H&P Note (Signed)
History and Physical Interval Note:  10/15/2020 3:55 PM  Deborah Cooke  has presented today for surgery, with the diagnosis of Left periprosthetic distal femur fracture.  The various methods of treatment have been discussed with the patient and family. After consideration of risks, benefits and other options for treatment, the patient has consented to  Procedure(s): OPEN REDUCTION INTERNAL FIXATION (ORIF) DISTAL FEMUR FRACTURE (Left) as a surgical intervention.  The patient's history has been reviewed, patient examined, no change in status, stable for surgery.  I have reviewed the patient's chart and labs.  Questions were answered to the patient's satisfaction.     Lennette Bihari P [place]

## 2020-10-15 NOTE — H&P (View-Only) (Signed)
Reason for Consult:Left distal femur fx Referring Physician: Milas Gain Time calledKU:[id number] Time at bedside: [place] is an 70 y.o. female.  HPI: Deborah Cooke was out yesterday and tripped on a curb. She went down onto her knees hard. She had immediate pain, especially in the left one. She could not get up or bear weight. She was brought to the ED where x-rays showed a periprosthetic distal femur fx and orthopedic surgery was consulted. She c/o localized pain to the area. She lives at home with her husband but he's gone a lot as an Print production planner. Her M-I-L is coming to stay with her. She has been ambulating with a cane of late due to OA of the left hip.  Past Medical History:  Diagnosis Date   Adverse effect of anesthetic 01/2012   difficulty waking up.   Adverse effect of unspecified anesthetic, initial encounter 08/2011   low o2 sats.   Anemia    Anxiety    Arthritis    Breast cancer ([place]) 02/16/2017   left breast   Chronic pain    Closed supracondylar fracture of right femur ([place]) 09/18/2012   Depression    Family history of breast cancer    GERD (gastroesophageal reflux disease)    Headache(784.0)    Hypothyroidism    Opioid dependence in controlled environment ([place])    Osteoporosis    Osteoporosis with fracture 09/19/2012   Thyroid disease     Past Surgical History:  Procedure Laterality Date   BREAST BIOPSY Left 02/16/2017   BREAST LUMPECTOMY WITH RADIOACTIVE SEED AND SENTINEL LYMPH NODE BIOPSY Left 03/15/2017   Procedure: LEFT BREAST LUMPECTOMY WITH RADIOACTIVE SEED AND LEFT AXILLARY SENTINEL LYMPH NODE BIOPSY;  Surgeon: [name] Overall, MD;  Location: [facility];  Service: General;  Laterality: Left;   CHOLECYSTECTOMY  10/1975   FEMUR IM NAIL  09/18/2012   Procedure: INTRAMEDULLARY (IM) RETROGRADE FEMORAL NAILING;  Surgeon: [name], MD;  Location: [facility];  Service: Orthopedics;  Laterality: Right;   GASTRIC BYPASS  2003    JOINT REPLACEMENT     REPLACEMENT TOTAL KNEE  2003   right and left -4 months aoart.   TONSILLECTOMY     WRIST FRACTURE SURGERY  08/2011    Family History  Problem Relation Age of Onset   Breast cancer Mother 36       died at 30   Cancer Father 52       uretherial cancer     Social History:  reports that she quit smoking about 31 years ago. She has a 28.00 pack-year smoking history. She quit smokeless tobacco use about 31 years ago. She reports current alcohol use. She reports that she does not use drugs.  Allergies:  Allergies  Allergen Reactions   Nsaids Nausea And Vomiting and Other (See Comments)    GI Upset (ibuprofen included)   Ibuprofen Other (See Comments)    Other reaction(s): Abdominal Pain   Tolmetin Other (See Comments)    Other reaction(s): Abdominal Pain     Medications: I have reviewed the patient's current medications.  Results for orders placed or performed during the hospital encounter of 10/14/20 (from the past 48 hour(s))  CBC with Differential/Platelet     Status: None   Collection Time: 10/14/20  5:18 PM  Result Value Ref Range   WBC 8.4 4.0 - 10.5 K/uL   RBC 4.15 3.87 - 5.11 MIL/uL   Hemoglobin 12.8 12.0 - 15.0  g/dL   HCT 37.1 36.0 - 46.0 %   MCV 89.4 80.0 - 100.0 fL   MCH 30.8 26.0 - 34.0 pg   MCHC 34.5 30.0 - 36.0 g/dL   RDW 12.8 11.5 - 15.5 %   Platelets 324 150 - 400 K/uL   nRBC 0.0 0.0 - 0.2 %   Neutrophils Relative % 76 %   Neutro Abs 6.5 1.7 - 7.7 K/uL   Lymphocytes Relative 13 %   Lymphs Abs 1.1 0.7 - 4.0 K/uL   Monocytes Relative 8 %   Monocytes Absolute 0.7 0.1 - 1.0 K/uL   Eosinophils Relative 1 %   Eosinophils Absolute 0.1 0.0 - 0.5 K/uL   Basophils Relative 1 %   Basophils Absolute 0.0 0.0 - 0.1 K/uL   Immature Granulocytes 1 %   Abs Immature Granulocytes 0.05 0.00 - 0.07 K/uL    Comment: Performed at [facility], [address] [place], [place] [id number]  Basic metabolic panel     Status: Abnormal    Collection Time: 10/14/20  5:18 PM  Result Value Ref Range   Sodium 129 (L) 135 - 145 mmol/L   Potassium 4.1 3.5 - 5.1 mmol/L   Chloride 92 (L) 98 - 111 mmol/L   CO2 28 22 - 32 mmol/L   Glucose, Bld 139 (H) 70 - 99 mg/dL    Comment: Glucose reference range applies only to samples taken after fasting for at least 8 hours.   BUN 22 8 - 23 mg/dL   Creatinine, Ser 0.71 0.44 - 1.00 mg/dL   Calcium 9.1 8.9 - 10.3 mg/dL   GFR, Estimated >60 >60 mL/min    Comment: (NOTE) Calculated using the CKD-EPI Creatinine Equation (2021)    Anion gap 9 5 - 15    Comment: Performed at [facility], [address] [place], [place] [address]  SARS Coronavirus 2 by RT PCR (hospital order, performed in [facility] hospital lab) Nasopharyngeal Nasopharyngeal Swab     Status: None   Collection Time: 10/14/20  5:18 PM   Specimen: Nasopharyngeal Swab  Result Value Ref Range   SARS Coronavirus 2 NEGATIVE NEGATIVE    Comment: (NOTE) SARS-CoV-2 target nucleic acids are NOT DETECTED.  The SARS-CoV-2 RNA is generally detectable in upper and lower respiratory specimens during the acute phase of infection. The lowest concentration of SARS-CoV-2 viral copies this assay can detect is 250 copies / mL. A negative result does not preclude SARS-CoV-2 infection and should not be used as the sole basis for treatment or other patient management decisions.  A negative result may occur with improper specimen collection / handling, submission of specimen other than nasopharyngeal swab, presence of viral mutation(s) within the areas targeted by this assay, and inadequate number of viral copies (<250 copies / mL). A negative result must be combined with clinical observations, patient history, and epidemiological information.  Fact Sheet for Patients:   StrictlyIdeas.no  Fact Sheet for Healthcare Providers: BankingDealers.co.za  This test is not yet approved or   cleared by the [place] FDA and has been authorized for detection and/or diagnosis of SARS-CoV-2 by FDA under an Emergency Use Authorization (EUA).  This EUA will remain in effect (meaning this test can be used) for the duration of the COVID-19 declaration under Section 564(b)(1) of the Act, 21 U.S.C. section 360bbb-3(b)(1), unless the authorization is terminated or revoked sooner.  Performed at [facility], [address] [place], [place]  [address]   CBC     Status: Abnormal   Collection Time: 10/15/20  1:26 AM  Result Value Ref Range   WBC 9.2 4.0 - 10.5 K/uL   RBC 3.47 (L) 3.87 - 5.11 MIL/uL   Hemoglobin 10.7 (L) 12.0 - 15.0 g/dL   HCT 31.1 (L) 36.0 - 46.0 %   MCV 89.6 80.0 - 100.0 fL   MCH 30.8 26.0 - 34.0 pg   MCHC 34.4 30.0 - 36.0 g/dL   RDW 12.9 11.5 - 15.5 %   Platelets 276 150 - 400 K/uL   nRBC 0.0 0.0 - 0.2 %    Comment: Performed at [facility], [place] [address] [place], [place] [address]  Comprehensive metabolic panel     Status: Abnormal   Collection Time: 10/15/20  1:26 AM  Result Value Ref Range   Sodium 133 (L) 135 - 145 mmol/L   Potassium 4.2 3.5 - 5.1 mmol/L   Chloride 97 (L) 98 - 111 mmol/L   CO2 26 22 - 32 mmol/L   Glucose, Bld 135 (H) 70 - 99 mg/dL    Comment: Glucose reference range applies only to samples taken after fasting for at least 8 hours.   BUN 18 8 - 23 mg/dL   Creatinine, Ser 0.62 0.44 - 1.00 mg/dL   Calcium 8.9 8.9 - 10.3 mg/dL   Total Protein 5.8 (L) 6.5 - 8.1 g/dL   Albumin 3.4 (L) 3.5 - 5.0 g/dL   AST 30 15 - 41 U/L   ALT 23 0 - 44 U/L   Alkaline Phosphatase 78 38 - 126 U/L   Total Bilirubin 0.4 0.3 - 1.2 mg/dL   GFR, Estimated >60 >60 mL/min    Comment: (NOTE) Calculated using the CKD-EPI Creatinine Equation (2021)    Anion gap 10 5 - 15    Comment: Performed at [facility], [place] [address] [place], [place] [address]  Surgical pcr screen     Status: None   Collection Time: 10/15/20  1:27 AM    Specimen: Nasal Mucosa; Nasal Swab  Result Value Ref Range   MRSA, PCR NEGATIVE NEGATIVE   Staphylococcus aureus NEGATIVE NEGATIVE    Comment: (NOTE) The Xpert SA Assay (FDA approved for NASAL specimens in patients 59 years of age and older), is one component of a comprehensive surveillance program. It is not intended to diagnose infection nor to guide or monitor treatment. Performed at [facility], [place] [address] [place], [place] [address]   TSH     Status: None   Collection Time: 10/15/20  2:01 AM  Result Value Ref Range   TSH 2.819 0.350 - 4.500 uIU/mL    Comment: Performed by a 3rd Generation assay with a functional sensitivity of <=0.01 uIU/mL. Performed at [facility], [place] [address], [place],  [address]     Chest Portable 1 View  Result Date: 10/15/2020 CLINICAL DATA:  Femoral fracture EXAM: PORTABLE CHEST 1 VIEW COMPARISON:  04/20/2016 FINDINGS: Heart size and pulmonary vascularity are normal for technique. Interstitial pattern to the lungs is similar to prior study, likely fibrosis. No airspace disease or consolidation. No pleural effusions. No pneumothorax. Mediastinal contours appear intact. 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 No evidence of active pulmonary disease. Electronically Signed   By: Lucienne Capers M.D.   On: 10/15/2020 03:48   DG Knee Complete 4 Views Left  Result Date: 10/14/2020 CLINICAL DATA:  Fall with pain. EXAM: LEFT KNEE -  COMPLETE 4+ VIEW COMPARISON:  12/31/2015 FINDINGS: Previous total knee arthroplasty. Comminuted fracture of the distal femur at the metaphysis. Patella not well demonstrated. No acute fracture of the proximal tibia or fibula. IMPRESSION: Comminuted fracture of the distal femur at the metaphysis. Patella not well demonstrated. No acute fracture of the proximal tibia or fibula. Electronically Signed   By: Nelson Chimes M.D.   On: 10/14/2020 16:38   DG HIP UNILAT WITH PELVIS MIN 4 VIEWS LEFT  Result Date:  10/14/2020 CLINICAL DATA:  Fall with left-sided pain EXAM: DG HIP (WITH OR WITHOUT PELVIS) 4+V LEFT COMPARISON:  None. FINDINGS: No acute fracture. Chronic osteoarthritis of both hips, left worse than right. Intramedullary nail of the right femur. IMPRESSION: No acute finding. Chronic osteoarthritis of both hips, left worse than right. Electronically Signed   By: Nelson Chimes M.D.   On: 10/14/2020 16:39    Review of Systems  HENT: Negative for ear discharge, ear pain, hearing loss and tinnitus.   Eyes: Negative for photophobia and pain.  Respiratory: Negative for cough and shortness of breath.   Cardiovascular: Negative for chest pain.  Gastrointestinal: Negative for abdominal pain, nausea and vomiting.  Genitourinary: Negative for dysuria, flank pain, frequency and urgency.  Musculoskeletal: Positive for arthralgias (Left knee). Negative for back pain, myalgias and neck pain.  Neurological: Negative for dizziness and headaches.  Hematological: Does not bruise/bleed easily.  Psychiatric/Behavioral: The patient is not nervous/anxious.    Blood pressure 110/79, pulse 89, temperature 98.2 F (36.8 C), temperature source Oral, resp. rate 14, height 5' (1.524 m), weight 106.6 kg, SpO2 95 %. Physical Exam Constitutional:      General: She is not in acute distress.    Appearance: She is well-developed and well-nourished. She is not diaphoretic.  HENT:     Head: Normocephalic and atraumatic.  Eyes:     General: No scleral icterus.       Right eye: No discharge.        Left eye: No discharge.     Conjunctiva/sclera: Conjunctivae normal.  Cardiovascular:     Rate and Rhythm: Normal rate and regular rhythm.  Pulmonary:     Effort: Pulmonary effort is normal. No respiratory distress.  Musculoskeletal:     Cervical back: Normal range of motion.     Comments: LLE No traumatic wounds or rash, knee ecchymotic  Severe TTP knee  No ankle effusion  Sens DPN, SPN, TN intact  Motor EHL, ext, flex,  evers 5/5  DP 1+, PT 0, No significant edema  Skin:    General: Skin is warm and dry.  Neurological:     Mental Status: She is alert.  Psychiatric:        Mood and Affect: Mood and affect normal.        Behavior: Behavior normal.     Assessment/Plan: Left distal femur fx -- Plan ORIF today by Dr. Doreatha Martin. Please keep NPO. Multiple medical problems including anemia, hypothyroidism, HTN, osteoporosis, breast cancer, and hip OA -- per primary service    Lisette Abu, PA-C Orthopedic Surgery [phone] 10/15/2020, 9:22 AM

## 2020-10-15 NOTE — Telephone Encounter (Signed)
Called pt to scheduled appts per 1/25 los. Pt stated that it was not a good time for her to schedule anything right now because she is in the hospital. Pt stated she would call [facility] when she feels up to scheduling appts. Sent msg to provider to inform of pt's condition.

## 2020-10-15 NOTE — H&P (Signed)
History and Physical    Deborah Cooke [id number] DOB: 19-Sep-1951 DOA: 10/14/2020  PCP: Rich Fuchs, PA   Patient coming from: home/[facility]  I have personally briefly reviewed patient's old medical records in [place]  Chief Complaint: left leg pain after mechanical fall  HPI: Deborah Cooke is a 70 y.o. female with medical history significant of anemia, hypothyroidism, HTN, osteoporosis, breast cancer. She say her oncologist today for routine follow-up. Afterward she tripped on a curb and had a hard fall. Subsequently had pain left leg and could not bear wait. EMS transported her to [facility] where x-ray revealed a distal femur fracture left. She is transferred to [facility] for operative repair by Dr. Doreatha Martin. .  ED Course: T 98.3  132/68  HR 106  RR 17. X-ray - left distal comminuted femur fracture. Lab reveals Na 129, glucose 139 CBCD nl, Covid NEGATIVE. EKG with sinus tachycardia.  Review of Systems: As per HPI otherwise 10 point review of systems negative.    Past Medical History:  Diagnosis Date  . Adverse effect of anesthetic 01/2012   difficulty waking up.  . Adverse effect of unspecified anesthetic, initial encounter 08/2011   low o2 sats.  . Anemia   . Anxiety   . Arthritis   . Breast cancer ([place]) 02/16/2017   left breast  . Chronic pain   . Closed supracondylar fracture of right femur ([place]) 09/18/2012  . Depression   . Family history of breast cancer   . GERD (gastroesophageal reflux disease)   . Headache(784.0)   . Hypothyroidism   . Opioid dependence in controlled environment ([place])   . Osteoporosis   . Osteoporosis with fracture 09/19/2012  . Thyroid disease     Past Surgical History:  Procedure Laterality Date  . BREAST BIOPSY Left 02/16/2017  . BREAST LUMPECTOMY WITH RADIOACTIVE SEED AND SENTINEL LYMPH NODE BIOPSY Left 03/15/2017   Procedure: LEFT BREAST LUMPECTOMY WITH RADIOACTIVE SEED AND LEFT AXILLARY SENTINEL LYMPH NODE BIOPSY;  Surgeon: [name] Overall,  MD;  Location: [facility];  Service: General;  Laterality: Left;  . CHOLECYSTECTOMY  10/1975  . FEMUR IM NAIL  09/18/2012   Procedure: INTRAMEDULLARY (IM) RETROGRADE FEMORAL NAILING;  Surgeon: [name], MD;  Location: [facility];  Service: Orthopedics;  Laterality: Right;  . GASTRIC BYPASS  2003  . JOINT REPLACEMENT    . REPLACEMENT TOTAL KNEE  2003   right and left -4 months aoart.  . TONSILLECTOMY    . WRIST FRACTURE SURGERY  08/2011    Soc Hx - 1st marriage - 79 years, widowed, 2nd marriage 82 years - divorced, 47rd marriage 20 years and good. She has two sons, 4 grandsons, 1 great-grand daughter. She lives with her husband. I-ADLs   reports that she quit smoking about 31 years ago. She has a 28.00 pack-year smoking history. She quit smokeless tobacco use about 31 years ago. She reports current alcohol use. She reports that she does not use drugs.  Allergies  Allergen Reactions  . Nsaids Nausea And Vomiting and Other (See Comments)    GI Upset (ibuprofen included)  . Ibuprofen Other (See Comments)    Other reaction(s): Abdominal Pain  . Tolmetin Other (See Comments)    Other reaction(s): Abdominal Pain     Family History  Problem Relation Age of Onset  . Breast cancer Mother 38       died at 93  . Cancer Father 58  uretherial cancer      Prior to Admission medications   Medication Sig Start Date End Date Taking? Authorizing Provider  AIMOVIG 140 MG/ML SOAJ Inject 140 mg as directed every 30 (thirty) days. 08/07/18   [provider]  anastrozole (ARIMIDEX) 1 MG tablet Take 1 tablet (1 mg total) by mouth daily. 01/14/20   Truitt Merle, MD  B Complex Vitamins (B COMPLEX 100 PO) Take 1 tablet by mouth daily.    [provider]  Biotin 1000 MCG tablet Take 5,000 mcg by mouth daily.     [provider]  Calcium Carbonate (CALCIUM 600 PO) Take 1 tablet by mouth daily.     [provider]  carbamazepine (TEGRETOL XR) 100 MG 12  hr tablet Take by mouth 3 (three) times daily.  07/26/18 07/27/19  [provider]  cholecalciferol (VITAMIN D) 400 units TABS tablet Take 400 Units by mouth daily.    [provider]  ferrous sulfate 325 (65 FE) MG EC tablet Take 325 mg by mouth 3 (three) times daily with meals.    [provider]  gabapentin (NEURONTIN) 800 MG tablet Take 800 mg by mouth 4 (four) times daily.     [provider]  hydrOXYzine (ATARAX/VISTARIL) 50 MG tablet Take 50 mg by mouth daily as needed.  02/18/17   [provider]  latanoprost (XALATAN) 0.005 % ophthalmic solution Place 1 drop into both eyes daily. At night 06/25/16   [provider]  levothyroxine (SYNTHROID, LEVOTHROID) 50 MCG tablet Take 75 mcg by mouth daily.     [provider]  linaclotide Rolan Lipa) 290 MCG CAPS capsule Take 290 mcg by mouth daily before breakfast. Patient not taking: Reported on 04/14/2020    [provider]  liothyronine (CYTOMEL) 25 MCG tablet Take 25 mcg by mouth daily.    [provider]  Multiple Vitamins-Iron (MULTIVITAMIN/IRON PO) Take 1 tablet by mouth daily.     [provider]  omeprazole (PRILOSEC) 40 MG capsule Take 40 mg by mouth daily.    [provider]  oxyCODONE-acetaminophen (PERCOCET) 10-325 MG tablet Take 1 tablet by mouth every 6 (six) hours as needed for pain.    [provider]  potassium chloride (K-DUR,KLOR-CON) 10 MEQ tablet Take 10 mEq by mouth daily.    [provider]  QUEtiapine (SEROQUEL XR) 300 MG 24 hr tablet Take 400 mg by mouth daily.     [provider]  tizanidine (ZANAFLEX) 6 MG capsule Take 6 mg by mouth 3 (three) times daily.    [provider]  vortioxetine HBr (TRINTELLIX) 20 MG TABS tablet Take 20 mg by mouth daily.    [provider]    Physical Exam: Vitals:   10/14/20 1914 10/14/20 2020 10/14/20 2120 10/14/20 2235  BP: 117/82 (!) 148/94 (!) 143/75  132/68  Pulse: 100 (!) 104 (!) 104 (!) 106  Resp: 16 16 16 17   Temp:   98.6 F (37 C) 98.3 F (36.8 C)  TempSrc:   Oral Oral  SpO2: 97% 96% 96% 97%  Weight:      Height:        Vitals:   10/14/20 1914 10/14/20 2020 10/14/20 2120 10/14/20 2235  BP: 117/82 (!) 148/94 (!) 143/75 132/68  Pulse: 100 (!) 104 (!) 104 (!) 106  Resp: 16 16 16 17   Temp:   98.6 F (37 C) 98.3 F (36.8 C)  TempSrc:   Oral Oral  SpO2: 97% 96% 96% 97%  Weight:      Height:       General: - overweight woman in pain but not restless or agitated. Eyes: PERRL, lids and conjunctivae normal ENMT: Mucous membranes are moist. Posterior pharynx clear of any exudate or lesions.Normal dentition.  Neck: normal, supple, no masses, no thyromegaly Respiratory: clear to auscultation bilaterally, no wheezing, no crackles. Normal respiratory effort. No accessory muscle use.  Cardiovascular: Regular rate and rhythm, II/VI systolic  Murmur RSB / no rubs / no gallops. 1+ distal lower extremity edema. 2+ pedal pulses. No carotid bruits.  Abdomen: obese, no tenderness, no masses palpated. No hepatosplenomegaly. Bowel sounds positive.  Musculoskeletal: no clubbing / cyanosis. No joint deformity upper and lower extremities. Good ROM except left leg - hindered by pain and brace, no contractures. Normal muscle tone.  Skin: no rashes, lesions, ulcers. No induration Neurologic: CN 2-12 grossly intact. Sensation intact, DTR normal. Strength 5/5 in all 4.  Psychiatric: Normal judgment and insight. Alert and oriented x 3. Normal mood.     Labs on Admission: I have personally reviewed following labs and imaging studies  CBC: Recent Labs  Lab 10/14/20 1059 10/14/20 1718  WBC 5.9 8.4  NEUTROABS 3.4 6.5  HGB 12.2 12.8  HCT 36.6 37.1  MCV 90.1 89.4  PLT 274 295   Basic Metabolic Panel: Recent Labs  Lab 10/14/20 1059 10/14/20 1718  NA 134* 129*  K 4.8 4.1  CL 96* 92*  CO2 30 28  GLUCOSE 90 139*  BUN 21 22  CREATININE  0.76 0.71  CALCIUM 9.1 9.1   GFR: Estimated Creatinine Clearance: 71.2 mL/min (by C-G formula based on SCr of 0.71 mg/dL). Liver Function Tests: Recent Labs  Lab 10/14/20 1059  AST 30  ALT 24  ALKPHOS 90  BILITOT 0.3  PROT 6.9  ALBUMIN 3.9   No results for input(s): LIPASE, AMYLASE in the last 168 hours. No results for input(s): AMMONIA in the last 168 hours. Coagulation Profile: No results for input(s): INR, PROTIME in the last 168 hours. Cardiac Enzymes: No results for input(s): CKTOTAL, CKMB, CKMBINDEX, TROPONINI in the last 168 hours. BNP (last 3 results) No results for input(s): PROBNP in the last 8760 hours. HbA1C: No results for input(s): HGBA1C in the last 72 hours. CBG: No results for input(s): GLUCAP in the last 168 hours. Lipid Profile: No results for input(s): CHOL, HDL, LDLCALC, TRIG, CHOLHDL, LDLDIRECT in the last 72 hours. Thyroid Function Tests: No results for input(s): TSH, T4TOTAL, FREET4, T3FREE, THYROIDAB in the last 72 hours. Anemia Panel: No results for input(s): VITAMINB12, FOLATE, FERRITIN, TIBC, IRON, RETICCTPCT in the last 72 hours. Urine analysis:    Component Value Date/Time   COLORURINE YELLOW 04/20/2016 1836   APPEARANCEUR CLEAR 04/20/2016 1836   LABSPEC 1.016 04/20/2016 1836   PHURINE 5.5 04/20/2016 1836   GLUCOSEU NEGATIVE 04/20/2016 1836   HGBUR NEGATIVE 04/20/2016 1836   BILIRUBINUR NEGATIVE 04/20/2016 1836   KETONESUR NEGATIVE 04/20/2016 1836   PROTEINUR NEGATIVE 04/20/2016 1836   NITRITE NEGATIVE 04/20/2016 1836   LEUKOCYTESUR NEGATIVE 04/20/2016 1836    Radiological Exams on Admission: DG Knee Complete 4 Views Left  Result Date: 10/14/2020 CLINICAL DATA:  Fall with pain. EXAM: LEFT KNEE - COMPLETE 4+ VIEW COMPARISON:  12/31/2015 FINDINGS: Previous total knee arthroplasty. Comminuted fracture of the distal femur at the metaphysis. Patella not well demonstrated. No acute fracture of the proximal tibia or fibula. IMPRESSION:  Comminuted fracture of the distal femur at the metaphysis. Patella not well demonstrated.  No acute fracture of the proximal tibia or fibula. Electronically Signed   By: Nelson Chimes M.D.   On: 10/14/2020 16:38   DG HIP UNILAT WITH PELVIS MIN 4 VIEWS LEFT  Result Date: 10/14/2020 CLINICAL DATA:  Fall with left-sided pain EXAM: DG HIP (WITH OR WITHOUT PELVIS) 4+V LEFT COMPARISON:  None. FINDINGS: No acute fracture. Chronic osteoarthritis of both hips, left worse than right. Intramedullary nail of the right femur. IMPRESSION: No acute finding. Chronic osteoarthritis of both hips, left worse than right. Electronically Signed   By: Nelson Chimes M.D.   On: 10/14/2020 16:39    EKG: Independently reviewed. Sinus tachycardia, no acute changes or injury  Assessment/Plan Active Problems:   Hypertension   Thyroid disease   Closed fracture of left distal femur (HCC)   Type I or II open comminuted intra-articular fracture of distal end of left femur, initial encounter ([place])    1. Ortho - fracture distal left femur. For operative repair. No medical contra-indications. Increased risk is minimal due to weight, HTN. Plan Standard preop testing  Protocol pain mgt.   2. HTN - stable . Continue home meds  3. Thyroid disease -  Continue present medication. TSH ordered    DVT prophylaxis: heparin  Code Status: full code  Family Communication:  Deferred due to hour and due to husband already aware of dx and tx plan.  Disposition Plan: TBD  Consults called: ortho - Dr. Lorre Nick contacted. Patient usually sees ortho at [facility] (Admission status: inpatient   Adella Hare MD [facility] Pager [phone]  If 7PM-7AM, please contact night-coverage [email or web address] Password [email or web address]  10/15/2020, 1:14 AM

## 2020-10-15 NOTE — Progress Notes (Signed)
PT Cancellation Note  Patient Details Name: RHIANON ZABAWA MRN: [id number] DOB: 03/12/1951   Cancelled Treatment:    Reason Eval/Treat Not Completed: Active bedrest order;Patient not medically ready Patient going to OR for surgery this afternoon. PT will follow up as appropriate.   Mashayla Lavin A. Gilford Rile PT, DPT Acute Rehabilitation Services Pager [phone] Office [phone]    Alda Lea 10/15/2020, 12:54 PM

## 2020-10-15 NOTE — Transfer of Care (Signed)
Immediate Anesthesia Transfer of Care Note  Patient: Deborah Cooke  Procedure(s) Performed: OPEN REDUCTION INTERNAL FIXATION (ORIF) DISTAL FEMUR FRACTURE (Left Leg Upper)  Patient Location: PACU  Anesthesia Type:General  Level of Consciousness: awake, alert  and oriented  Airway & Oxygen Therapy: Patient Spontanous Breathing  Post-op Assessment: Report given to RN  Post vital signs: Reviewed and stable  Last Vitals:  Vitals Value Taken Time  BP    Temp    Pulse    Resp    SpO2      Last Pain:  Vitals:   10/15/20 1505  TempSrc:   PainSc: 5          Complications: No complications documented.

## 2020-10-15 NOTE — Plan of Care (Signed)

## 2020-10-16 ENCOUNTER — Encounter (HOSPITAL_COMMUNITY): Payer: Self-pay | Admitting: Internal Medicine

## 2020-10-16 ENCOUNTER — Telehealth: Payer: Self-pay

## 2020-10-16 DIAGNOSIS — S72492D Other fracture of lower end of left femur, subsequent encounter for closed fracture with routine healing: Secondary | ICD-10-CM | POA: Diagnosis not present

## 2020-10-16 DIAGNOSIS — I10 Essential (primary) hypertension: Secondary | ICD-10-CM | POA: Diagnosis not present

## 2020-10-16 DIAGNOSIS — E079 Disorder of thyroid, unspecified: Secondary | ICD-10-CM | POA: Diagnosis not present

## 2020-10-16 LAB — CBC
HCT: 26.1 % — ABNORMAL LOW (ref 36.0–46.0)
Hemoglobin: 9 g/dL — ABNORMAL LOW (ref 12.0–15.0)
MCH: 31.3 pg (ref 26.0–34.0)
MCHC: 34.5 g/dL (ref 30.0–36.0)
MCV: 90.6 fL (ref 80.0–100.0)
Platelets: 203 10*3/uL (ref 150–400)
RBC: 2.88 MIL/uL — ABNORMAL LOW (ref 3.87–5.11)
RDW: 12.9 % (ref 11.5–15.5)
WBC: 5.8 10*3/uL (ref 4.0–10.5)
nRBC: 0 % (ref 0.0–0.2)

## 2020-10-16 LAB — VITAMIN D 25 HYDROXY (VIT D DEFICIENCY, FRACTURES): Vit D, 25-Hydroxy: 37.12 ng/mL (ref 30–100)

## 2020-10-16 MED ORDER — ALBUTEROL SULFATE (2.5 MG/3ML) 0.083% IN NEBU: 3.0000 mL | INHALATION_SOLUTION | RESPIRATORY_TRACT | Status: DC | PRN

## 2020-10-16 MED ORDER — ATENOLOL 25 MG PO TABS
25.0000 mg | ORAL_TABLET | Freq: Every day | ORAL | Status: DC
  Administered 2020-10-17 – 2020-10-21 (×5): 25 mg via ORAL
  Filled 2020-10-16 (×6): qty 1

## 2020-10-16 MED ORDER — OXYCODONE HCL 5 MG PO TABS
5.0000 mg | ORAL_TABLET | Freq: Four times a day (QID) | ORAL | Status: DC
  Administered 2020-10-16 – 2020-10-21 (×19): 5 mg via ORAL
  Filled 2020-10-16 (×19): qty 1

## 2020-10-16 MED ORDER — CARBAMAZEPINE ER 200 MG PO TB12
400.0000 mg | ORAL_TABLET | Freq: Two times a day (BID) | ORAL | Status: DC
Start: 2020-10-16 — End: 2020-10-21
  Administered 2020-10-16 – 2020-10-21 (×11): 400 mg via ORAL
  Filled 2020-10-16 (×6): qty 2
  Filled 2020-10-16: qty 1
  Filled 2020-10-16 (×6): qty 2

## 2020-10-16 MED ORDER — OXYCODONE-ACETAMINOPHEN 5-325 MG PO TABS
1.0000 | ORAL_TABLET | Freq: Four times a day (QID) | ORAL | Status: DC
  Administered 2020-10-16 – 2020-10-21 (×20): 1 via ORAL
  Filled 2020-10-16 (×20): qty 1

## 2020-10-16 MED ORDER — BUPROPION HCL ER (XL) 150 MG PO TB24
300.0000 mg | ORAL_TABLET | Freq: Every day | ORAL | Status: DC
Start: 2020-10-16 — End: 2020-10-21
  Administered 2020-10-16 – 2020-10-21 (×6): 300 mg via ORAL
  Filled 2020-10-16 (×6): qty 2

## 2020-10-16 MED ORDER — CYCLOBENZAPRINE HCL 10 MG PO TABS
10.0000 mg | ORAL_TABLET | Freq: Three times a day (TID) | ORAL | Status: DC
  Administered 2020-10-16 – 2020-10-21 (×14): 10 mg via ORAL
  Filled 2020-10-16 (×14): qty 1

## 2020-10-16 NOTE — Evaluation (Signed)
Occupational Therapy Evaluation Patient Details Name: Deborah Cooke MRN: [id number] DOB: 02-10-1951 Today's Date: 10/16/2020    History of Present Illness Deborah Cooke is a 70 y.o. female with medical history significant of anemia, hypothyroidism, HTN, osteoporosis, breast cancer. She say her oncologist today for routine follow-up. Afterward she tripped on a curb and had a hard fall. Subsequently had pain left leg and could not bear wait. EMS transported her to [facility] where x-ray revealed a distal femur fracture left. She is transferred to [facility] for operative repair by Dr. Doreatha Martin ORIF   Clinical Impression   Pt presents with decline in function and safety with ADLs and ADL mobility with impaired strength, balance and endurance. Pt is TDWB of L LE. lives at home with her husband (who is gone a lot due to being a Administrator) and she was Ind with ADLs/selfcare and mobility. Pt currently requires max A to sit OEB, max A + 2 for SPTs, max - total A with LB ADLs and total A with toileting. Pt has poor insight into her deficits and states that her 58 y/o mother in law can assist her, but not physically assist her at home. Recommending [facility] SNF for rehab before d/c home. Pt would benefit from acute OT services to address impairments to maximize level of function and safety    Follow Up Recommendations  SNF    Equipment Recommendations  3 in 1 bedside commode;Other (comment) (RW, ADL A/E kit)    Recommendations for Other Services       Precautions / Restrictions Precautions Precautions: Fall Restrictions Weight Bearing Restrictions: Yes LLE Weight Bearing: Touchdown weight bearing      Mobility Bed Mobility Overal bed mobility: Needs Assistance Bed Mobility: Supine to Sit     Supine to sit: Max assist     General bed mobility comments: Assist with L  LE and to elevate trunkwith cues for using rail and to scoot hips to EOB and at EOB    Transfers Overall transfer level: Needs  assistance Equipment used: Rolling walker (2 wheeled) Transfers: Sit to/from [facility] Sit to Stand: Max assist;+2 physical assistance;+2 safety/equipment Stand pivot transfers: Max assist;+2 physical assistance;+2 safety/equipment       General transfer comment: multiple verbal cues to maintain TDWB of L LE, push thorugh with UEs using RW and for pivoting to [facility] from the bed. SPT frmo BSC to recliner not as difficult for her    Balance Overall balance assessment: Needs assistance Sitting-balance support: No upper extremity supported Sitting balance-Leahy Scale: Fair     Standing balance support: Bilateral upper extremity supported;During functional activity Standing balance-Leahy Scale: Poor                             ADL either performed or assessed with clinical judgement   ADL Overall ADL's : Needs assistance/impaired Eating/Feeding: Independent;Sitting   Grooming: Wash/dry hands;Wash/dry face;Min guard;Sitting   Upper Body Bathing: Min guard;Sitting   Lower Body Bathing: Maximal assistance;Sitting/lateral leans   Upper Body Dressing : Min guard;Sitting   Lower Body Dressing: Total assistance   Toilet Transfer: Maximal assistance;+2 for physical assistance;+2 for safety/equipment;Stand-pivot;RW;BSC;Cueing for safety;Cueing for sequencing   Toileting- Clothing Manipulation and Hygiene: Total assistance       Functional mobility during ADLs: Maximal assistance;+2 for physical assistance;+2 for safety/equipment;Cueing for safety;Cueing for sequencing;Rolling walker General ADL Comments: pt transferred to [facility], total A for clothing mgt and posterior peri  hygiene. Pt required to stand for hygiene     Vision Baseline Vision/History: Wears glasses Wears Glasses: At all times Patient Visual Report: No change from baseline       Perception     Praxis      Pertinent Vitals/Pain Pain Assessment: Faces Pain Score: 6  Faces Pain Scale:  Hurts even more Pain Location: L LE Pain Descriptors / Indicators: Throbbing;Grimacing;Guarding Pain Intervention(s): Limited activity within patient's tolerance;Premedicated before session;Monitored during session;Repositioned     Hand Dominance Right   Extremity/Trunk Assessment Upper Extremity Assessment Upper Extremity Assessment: Generalized weakness   Lower Extremity Assessment Lower Extremity Assessment: Defer to PT evaluation       Communication Communication Communication: No difficulties   Cognition Arousal/Alertness: Awake/alert Behavior During Therapy: WFL for tasks assessed/performed Overall Cognitive Status: Within Functional Limits for tasks assessed                                 General Comments: poor insight into deficits initially as she was stating that her 36 year old mother in law "can assist her, but not phydically asssit her"   General Comments       Exercises     Shoulder Instructions      Home Living Family/patient expects to be discharged to:: Private residence Living Arrangements: Spouse/significant other Available Help at Discharge: Family;Available 24 hours/day Type of Home: House Home Access: Stairs to enter [employer] of Steps: 1 curb   Home Layout: One level     Bathroom Shower/Tub: Occupational psychologist: Standard     Home Equipment: Environmental consultant - 2 wheels;Cane - single point          Prior Functioning/Environment Level of Independence: Independent        Comments: uses SPC sporadically, Ind with ADLs/selfcare, home mgt, was driving        OT Problem List: Decreased strength;Impaired balance (sitting and/or standing);Decreased safety awareness;Decreased activity tolerance;Decreased knowledge of use of DME or AE;Obesity;Decreased knowledge of precautions      OT Treatment/Interventions: Self-care/ADL training;Therapeutic exercise;DME and/or AE instruction;Balance training;Energy  conservation;Patient/family education    OT Goals(Current goals can be found in the care plan section) Acute Rehab OT Goals Patient Stated Goal: wants to go home OT Goal Formulation: With patient Time For Goal Achievement: 10/30/20 Potential to Achieve Goals: Good ADL Goals Pt Will Perform Grooming: with set-up;with supervision;sitting Pt Will Perform Upper Body Bathing: with supervision;with set-up;sitting Pt Will Perform Lower Body Bathing: with mod assist;sitting/lateral leans Pt Will Perform Upper Body Dressing: with supervision;with set-up;sitting Pt Will Transfer to Toilet: with mod assist;stand pivot transfer;bedside commode Pt Will Perform Toileting - Clothing Manipulation and hygiene: with max assist;with mod assist;sitting/lateral leans;sit to/from stand  OT Frequency: Min 2X/week   Barriers to D/C:            Co-evaluation PT/OT/SLP Co-Evaluation/Treatment: Yes Reason for Co-Treatment: For patient/therapist safety;To address functional/ADL transfers   OT goals addressed during session: ADL's and self-care;Proper use of Adaptive equipment and DME      AM-PAC OT "6 Clicks" Daily Activity     Outcome Measure Help from another person eating meals?: None Help from another person taking care of personal grooming?: A Little Help from another person toileting, which includes using toliet, bedpan, or urinal?: Total Help from another person bathing (including washing, rinsing, drying)?: A Lot Help from another person to put on and taking off regular upper body  clothing?: A Little Help from another person to put on and taking off regular lower body clothing?: Total 6 Click Score: 14   End of Session Equipment Utilized During Treatment: Gait belt;Rolling walker;Other (comment) [facility]) Nurse Communication: Mobility status  Activity Tolerance: Patient limited by pain Patient left: in chair;with call bell/phone within reach;with chair alarm set;with family/visitor present  OT  Visit Diagnosis: Unsteadiness on feet (R26.81);Other abnormalities of gait and mobility (R26.89);History of falling (Z91.81);Muscle weakness (generalized) (M62.81);Pain Pain - Right/Left: Left Pain - part of body: Leg                Time: 1050-1126 OT Time Calculation (min): 36 min Charges:  OT General Charges $OT Visit: 1 Visit OT Evaluation $OT Eval Moderate Complexity: 1 Mod    Britt Bottom 10/16/2020, 1:16 PM

## 2020-10-16 NOTE — Evaluation (Signed)
Physical Therapy Evaluation Patient Details Name: Deborah Cooke MRN: [id number] DOB: 18-Jun-1951 Today's Date: 10/16/2020   History of Present Illness  Deborah Cooke is a 70 y.o. female with medical history significant of anemia, hypothyroidism, HTN, osteoporosis, breast cancer. She say her oncologist today for routine follow-up. Afterward she tripped on a curb and had a hard fall. Subsequently had pain left leg and could not bear wait. EMS transported her to [facility] where x-ray revealed a distal femur fracture left. She is transferred to [facility] for operative repair by Dr. Doreatha Martin ORIF  Clinical Impression  PTA, patient lives with husband (who travels due to being truck driver) and independent with mobility. Patient requires maxA+2 for transfers with RW. Patient has poor insight into deficits with stating her 17 y/o mother in law can assist her but not physically assist her. Patient presents with impaired balance, generalized weakness, decreased activity tolerance, and impaired functional mobility. Patient will benefit from skilled PT services during acute stay to address listed deficits. Recommend SNF following discharge to maximize functional independence before d/c home.      Follow Up Recommendations SNF;Supervision/Assistance - 24 hour    Equipment Recommendations  3in1 (PT);Wheelchair cushion (measurements PT);Wheelchair (measurements PT)    Recommendations for Other Services       Precautions / Restrictions Precautions Precautions: Fall Restrictions Weight Bearing Restrictions: Yes LLE Weight Bearing: Touchdown weight bearing      Mobility  Bed Mobility Overal bed mobility: Needs Assistance Bed Mobility: Supine to Sit     Supine to sit: Max assist     General bed mobility comments: Assist with L  LE and to elevate trunkwith cues for using rail and to scoot hips to EOB and at EOB    Transfers Overall transfer level: Needs assistance Equipment used: Rolling [employer] (2  wheeled) Transfers: Sit to/from [facility] Sit to Stand: Max assist;+2 physical assistance;+2 safety/equipment Stand pivot transfers: Max assist;+2 physical assistance;+2 safety/equipment       General transfer comment: multiple verbal cues to maintain TDWB of L LE, push thorugh with UEs using RW and for pivoting to [facility] from the bed. SPT frmo BSC to recliner not as difficult for her  Ambulation/Gait                Stairs            Wheelchair Mobility    Modified Rankin (Stroke Patients Only)       Balance Overall balance assessment: Needs assistance Sitting-balance support: No upper extremity supported Sitting balance-Leahy Scale: Fair     Standing balance support: Bilateral upper extremity supported;During functional activity Standing balance-Leahy Scale: Poor                               Pertinent Vitals/Pain Pain Assessment: Faces Pain Score: 6  Faces Pain Scale: Hurts even more Pain Location: L LE Pain Descriptors / Indicators: Throbbing;Grimacing;Guarding Pain Intervention(s): Monitored during session;Repositioned    Home Living Family/patient expects to be discharged to:: Private residence Living Arrangements: Spouse/significant other Available Help at Discharge: Family;Available 24 hours/day Type of Home: House Home Access: Stairs to enter   [employer] of Steps: 1 curb Home Layout: One level Home Equipment: Environmental consultant - 2 wheels;Cane - single point      Prior Function Level of Independence: Independent         Comments: uses SPC sporadically, Ind with ADLs/selfcare, home mgt, was driving  Hand Dominance   Dominant Hand: Right    Extremity/Trunk Assessment   Upper Extremity Assessment Upper Extremity Assessment: Defer to OT evaluation    Lower Extremity Assessment Lower Extremity Assessment: Generalized weakness;LLE deficits/detail LLE: Unable to fully assess due to pain        Communication   Communication: No difficulties  Cognition Arousal/Alertness: Awake/alert Behavior During Therapy: WFL for tasks assessed/performed Overall Cognitive Status: Within Functional Limits for tasks assessed                                 General Comments: poor insight into deficits initially as she was stating that her 31 year old mother in law "can assist her, but not phydically asssit her"      General Comments      Exercises     Assessment/Plan    PT Assessment Patient needs continued PT services  PT Problem List Decreased range of motion;Decreased strength;Decreased activity tolerance;Decreased balance;Decreased mobility;Decreased safety awareness       PT Treatment Interventions DME instruction;Gait training;Stair training;Functional mobility training;Therapeutic activities;Therapeutic exercise;Balance training;Patient/family education    PT Goals (Current goals can be found in the Care Plan section)  Acute Rehab PT Goals Patient Stated Goal: wants to go home PT Goal Formulation: With patient Time For Goal Achievement: 10/30/20 Potential to Achieve Goals: Fair    Frequency Min 3X/week   Barriers to discharge        Co-evaluation PT/OT/SLP Co-Evaluation/Treatment: Yes Reason for Co-Treatment: For patient/therapist safety;To address functional/ADL transfers PT goals addressed during session: Mobility/safety with mobility;Proper use of DME;Balance OT goals addressed during session: ADL's and self-care;Proper use of Adaptive equipment and DME       AM-PAC PT "6 Clicks" Mobility  Outcome Measure Help needed turning from your back to your side while in a flat bed without using bedrails?: A Lot Help needed moving from lying on your back to sitting on the side of a flat bed without using bedrails?: A Lot Help needed moving to and from a bed to a chair (including a wheelchair)?: A Lot Help needed standing up from a chair using your arms (e.g.,  wheelchair or bedside chair)?: A Lot Help needed to walk in hospital room?: A Lot Help needed climbing 3-5 steps with a railing? : A Lot 6 Click Score: 12    End of Session Equipment Utilized During Treatment: Gait belt Activity Tolerance: Patient limited by pain Patient left: in chair;with call bell/phone within reach;with chair alarm set Nurse Communication: Mobility status PT Visit Diagnosis: Unsteadiness on feet (R26.81);Other abnormalities of gait and mobility (R26.89);Muscle weakness (generalized) (M62.81);History of falling (Z91.81)    Time: 2119-4174 PT Time Calculation (min) (ACUTE ONLY): 41 min   Charges:   PT Evaluation $PT Eval Moderate Complexity: 1 Mod PT Treatments $Therapeutic Activity: 8-22 mins        Gabbi Whetstone A. Gilford Rile PT, DPT Acute Rehabilitation Services Pager [phone] Office [phone]   Alda Lea 10/16/2020, 1:26 PM

## 2020-10-16 NOTE — Telephone Encounter (Signed)
-----   Message from Alla Feeling, NP sent at 10/14/2020  4:51 PM EST ----- Please let her know left hip xray shows osteoarthritis, as I suspected, and left >right. No evidence of cancer. She has orthopedist already, can call them for management.   Thanks, Regan Rakers, NP

## 2020-10-16 NOTE — Progress Notes (Signed)
Orthopaedic Trauma Progress Note  SUBJECTIVE: Reports moderate pain about operative site this morning.  States she goes to [facility] in [place], has pain contract for Percocet 10-325 mg 4 times daily.  We will adjust pain medications this morning back to her baseline dosages and see how she does with this.  No chest pain. No SOB. No nausea/vomiting. + Flatus.  No other complaints.   OBJECTIVE:  Vitals:   10/16/20 0009 10/16/20 0426  BP: 123/82 118/60  Pulse: 95 97  Resp: 18 17  Temp: 99.2 F (37.3 C) 99.6 F (37.6 C)  SpO2: 98% 99%    General: Sitting up in bed, no acute distress Respiratory: No increased work of breathing.  LLE: Dressing clean, dry, intact.  Tenderness about the knee as expected.  Nontender in the calf or lower leg.  Ankle dorsiflexion/plantarflexion intact.  Knee motion limited secondary to pain.+ EHL.+ FHL.  Compartments soft and compressible.  Endorses sensation light touch distally.  2+ DP pulse  IMAGING: Stable post op imaging.   LABS:  Results for orders placed or performed during the hospital encounter of 10/14/20 (from the past 24 hour(s))  VITAMIN D 25 Hydroxy (Vit-D Deficiency, Fractures)     Status: None   Collection Time: 10/16/20 12:49 AM  Result Value Ref Range   Vit D, 25-Hydroxy 37.12 30 - 100 ng/mL    ASSESSMENT: Deborah Cooke is a 70 y.o. female, 1 Day Post-Op s/p ORIF LEFT DISTAL FEMUR FRACTURE  CV/Blood loss: Acute blood loss anemia.  Hemoglobin 9.0 this morning.  Hemodynamically stable  PLAN: Weightbearing: TDWB LLE Incisional and dressing care: Plan to change tomorrow Showering: May begin showering with assistance 10/18/20 Orthopedic device(s): None  Pain management:  1. Tylenol 325-650 mg q 6 hours PRN 2. Flexeril 10 mg TID 3. Percocet 10-325 mg 4 times daily 4. Neurontin 800 mg TID 5. Dilaudid 0.5-1 mg q 4 hours PRN VTE prophylaxis: Lovenox, SCDs ID: Ancef 2gm post op Foley/Lines:  No foley, KVO  IVFs Impediments to Fracture Healing: vit D level 37, no supplementation needed Dispo: PT/OT eval today, dispo pending. Will likely need SNF Follow - up plan: 2 weeks after discharge  Contact information:  Katha Hamming MD, Patrecia Pace PA-C. After hours and holidays please check [email or web address] for group call information for [facility]   Lehman Whiteley A. Ricci Barker, PA-C [phone] (office) [email or web address]

## 2020-10-16 NOTE — Progress Notes (Signed)
PROGRESS NOTE  Deborah Cooke [id number] DOB: 1950/12/24 DOA: 10/14/2020 PCP: Rich Fuchs, PA  HPI/Recap of past 24 hours: Deborah Cooke is a 70 y.o. female with medical history significant of anemia, hypothyroidism, HTN, osteoporosis, breast cancer. She say her oncologist today for routine follow-up. Afterward she tripped on a curb and had a hard fall. Subsequently had pain left leg and could not bear wait. EMS transported her to [facility] where x-ray revealed a distal femur fracture left. She is transferred to [facility] for operative repair by Dr. Doreatha Martin. .  ED Course: T 98.3  132/68  HR 106  RR 17. X-ray - left distal comminuted femur fracture. Lab reveals Na 129, glucose 139 CBCD nl, Covid NEGATIVE. EKG with sinus tachycardia.   Subjective: October 16, 2020: Postop day 1.  Patient seen and examined at bedside pain and also depression she says she feels like rolling into a hole and I. She does not want to go to rehab but she has no choice because her husband is a Pharmacist, community and is away out of state. Her antidepressant will hold due to being n.p.o. but they have been restarted  October 15, 2020, Patient seen and examined at bedside he was admitted last night early this morning for fall with fracture of the left distal femur.  Patient is in pain.  She stated she is waiting for her surgery which was scheduled to be at 230 today.  She is n.p.o.  Also complaining of headache.   Assessment/Plan: Active Problems:   Hypertension   Thyroid disease   Closed fracture of left distal femur (HCC)   Type I or II open comminuted intra-articular fracture of distal end of left femur, initial encounter ([place])  1.  Left distal femoral fracture.  Status post OPEN REDUCTION INTERNAL FIXATION (ORIF) DISTAL FEMUR FRACTURE (Left)  2.  Hypertension controlled continue home meds next  3.  Hypothyroidism continue current home dose of levothyroxine and Cytomel  4.  Breast cancer patient is on aromatase continue  with her  5.  History of osteoporosis.  Currently not on medication  6.  Type 2 diabetes mellitus Patient does not seem to be admitted on medication we will continue low-carb diet  7. Depression. Restart her antidepressants  Code Status: Full  Severity of Illness: The appropriate patient status for this patient is INPATIENT. Inpatient status is judged to be reasonable and necessary in order to provide the required intensity of service to ensure the patient's safety. The patient's presenting symptoms, physical exam findings, and initial radiographic and laboratory data in the context of their chronic comorbidities is felt to place them at high risk for further clinical deterioration. Furthermore, it is not anticipated that the patient will be medically stable for discharge from the hospital within 2 midnights of admission. The following factors support the patient status of inpatient.   " The patient's presenting symptoms include fall with fracture. " The worrisome physical exam findings include fracture left femur. " The initial radiographic and laboratory data are worrisome because of fracture left femur. " The chronic co-morbidities include hypertension diabetes.   * I certify that at the point of admission it is my clinical judgment that the patient will require inpatient hospital care spanning beyond 2 midnights from the point of admission due to high intensity of service, high risk for further deterioration and high frequency of surveillance required.*    Family Communication: Patient  Disposition Plan: To be determined possibly inpatient rehab  Consultants:  Orthopedic Dr. Doreatha Martin  Procedures: OPEN REDUCTION INTERNAL FIXATION (ORIF) DISTAL FEMUR FRACTURE (Left)  Antimicrobials:  Intraoperative antibiotic  DVT prophylaxis: Heparin   Objective: Vitals:   10/15/20 2010 10/16/20 0009 10/16/20 0426 10/16/20 0829  BP: (!) 169/98 123/82 118/60 (!) 95/53  Pulse: 100 95 97  98  Resp: 17 18 17 14   Temp: 98.4 F (36.9 C) 99.2 F (37.3 C) 99.6 F (37.6 C) 97.7 F (36.5 C)  TempSrc: Oral Oral Oral Oral  SpO2: 95% 98% 99% 94%  Weight:      Height:        Intake/Output Summary (Last 24 hours) at 10/16/2020 1030 Last data filed at 10/16/2020 0600 Gross per 24 hour  Intake 1338.31 ml  Output 950 ml  Net 388.31 ml   Filed Weights   10/14/20 1544  Weight: 106.6 kg   Body mass index is 45.9 kg/m.  Exam:  . General: 70 y.o. year-old female well developed well nourished in no acute distress.  Alert and oriented x3.  Obese:In mild distress from pain . Cardiovascular: Regular rate and rhythm with no rubs or gallops.  No thyromegaly or JVD noted.   Marland Kitchen Respiratory: Clear to auscultation with no wheezes or rales. Good inspiratory effort. . Abdomen: Soft nontender nondistended with normal bowel sounds x4 quadrants. . Musculoskeletal: Tender left leg to palpation, no lower extremity edema. 2/4 pulses in all 4 extremities. . Skin: No ulcerative lesions noted or rashes, . Psychiatry: Mood is appropriate for condition and setting    Data Reviewed: CBC: Recent Labs  Lab 10/14/20 1059 10/14/20 1718 10/15/20 0126 10/16/20 0808  WBC 5.9 8.4 9.2 5.8  NEUTROABS 3.4 6.5  --   --   HGB 12.2 12.8 10.7* 9.0*  HCT 36.6 37.1 31.1* 26.1*  MCV 90.1 89.4 89.6 90.6  PLT 274 324 276 [id number]   Basic Metabolic Panel: Recent Labs  Lab 10/14/20 1059 10/14/20 1718 10/15/20 0126  NA 134* 129* 133*  K 4.8 4.1 4.2  CL 96* 92* 97*  CO2 30 28 26   GLUCOSE 90 139* 135*  BUN 21 22 18   CREATININE 0.76 0.71 0.62  CALCIUM 9.1 9.1 8.9   GFR: Estimated Creatinine Clearance: 71.2 mL/min (by C-G formula based on SCr of 0.62 mg/dL). Liver Function Tests: Recent Labs  Lab 10/14/20 1059 10/15/20 0126  AST 30 30  ALT 24 23  ALKPHOS 90 78  BILITOT 0.3 0.4  PROT 6.9 5.8*  ALBUMIN 3.9 3.4*   No results for input(s): LIPASE, AMYLASE in the last 168 hours. No results for  input(s): AMMONIA in the last 168 hours. Coagulation Profile: No results for input(s): INR, PROTIME in the last 168 hours. Cardiac Enzymes: No results for input(s): CKTOTAL, CKMB, CKMBINDEX, TROPONINI in the last 168 hours. BNP (last 3 results) No results for input(s): PROBNP in the last 8760 hours. HbA1C: No results for input(s): HGBA1C in the last 72 hours. CBG: No results for input(s): GLUCAP in the last 168 hours. Lipid Profile: No results for input(s): CHOL, HDL, LDLCALC, TRIG, CHOLHDL, LDLDIRECT in the last 72 hours. Thyroid Function Tests: Recent Labs    10/15/20 0201  TSH 2.819   Anemia Panel: No results for input(s): VITAMINB12, FOLATE, FERRITIN, TIBC, IRON, RETICCTPCT in the last 72 hours. Urine analysis:    Component Value Date/Time   COLORURINE YELLOW 04/20/2016 1836   APPEARANCEUR CLEAR 04/20/2016 1836   LABSPEC 1.016 04/20/2016 1836   PHURINE 5.5 04/20/2016 1836   GLUCOSEU NEGATIVE 04/20/2016 1836  HGBUR NEGATIVE 04/20/2016 1836   BILIRUBINUR NEGATIVE 04/20/2016 1836   KETONESUR NEGATIVE 04/20/2016 1836   PROTEINUR NEGATIVE 04/20/2016 1836   NITRITE NEGATIVE 04/20/2016 1836   LEUKOCYTESUR NEGATIVE 04/20/2016 1836   Sepsis Labs: @LABRCNTIP (procalcitonin:4,lacticidven:4)  ) Recent Results (from the past 240 hour(s))  SARS Coronavirus 2 by RT PCR (hospital order, performed in [facility] hospital lab) Nasopharyngeal Nasopharyngeal Swab     Status: None   Collection Time: 10/14/20  5:18 PM   Specimen: Nasopharyngeal Swab  Result Value Ref Range Status   SARS Coronavirus 2 NEGATIVE NEGATIVE Final    Comment: (NOTE) SARS-CoV-2 target nucleic acids are NOT DETECTED.  The SARS-CoV-2 RNA is generally detectable in upper and lower respiratory specimens during the acute phase of infection. The lowest concentration of SARS-CoV-2 viral copies this assay can detect is 250 copies / mL. A negative result does not preclude SARS-CoV-2 infection and should not be used  as the sole basis for treatment or other patient management decisions.  A negative result may occur with improper specimen collection / handling, submission of specimen other than nasopharyngeal swab, presence of viral mutation(s) within the areas targeted by this assay, and inadequate number of viral copies (<250 copies / mL). A negative result must be combined with clinical observations, patient history, and epidemiological information.  Fact Sheet for Patients:   StrictlyIdeas.no  Fact Sheet for Healthcare Providers: BankingDealers.co.za  This test is not yet approved or  cleared by the [place] FDA and has been authorized for detection and/or diagnosis of SARS-CoV-2 by FDA under an Emergency Use Authorization (EUA).  This EUA will remain in effect (meaning this test can be used) for the duration of the COVID-19 declaration under Section 564(b)(1) of the Act, 21 U.S.C. section 360bbb-3(b)(1), unless the authorization is terminated or revoked sooner.  Performed at [facility], [address] [place], [place] [address]   Surgical pcr screen     Status: None   Collection Time: 10/15/20  1:27 AM   Specimen: Nasal Mucosa; Nasal Swab  Result Value Ref Range Status   MRSA, PCR NEGATIVE NEGATIVE Final   Staphylococcus aureus NEGATIVE NEGATIVE Final    Comment: (NOTE) The Xpert SA Assay (FDA approved for NASAL specimens in patients 72 years of age and older), is one component of a comprehensive surveillance program. It is not intended to diagnose infection nor to guide or monitor treatment. Performed at [facility], [place] [address] [place], [place] [address]       Studies: DG Knee Left Port  Result Date: 10/15/2020 CLINICAL DATA:  70 year old female with left lower extremity fracture EXAM: PORTABLE LEFT KNEE - 1-2 VIEW COMPARISON:  Intraoperative fluoroscopic radiograph dated 09/25/2020. FINDINGS: Open  reduction and internal fixation of distal femoral fracture with sideplate fixation and screws. The hardware is intact. There is approximately 3 mm gap between the distal aspect of the sideplate and distal femoral cortex. 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 The bones are osteopenic. The soft tissues are grossly unremarkable. IMPRESSION: Open reduction and internal fixation of the distal femoral fracture. Electronically Signed   By: Anner Crete M.D.   On: 10/15/2020 18:39   DG C-Arm 1-60 Min  Result Date: 10/15/2020 CLINICAL DATA:  ORIF left remote fracture EXAM: LEFT FEMUR 2 VIEWS; DG C-ARM 1-60 MIN COMPARISON:  10/14/2020 FINDINGS: Multiple intraoperative spot images demonstrate plate and screw fixation across the distal femoral fracture. Prior knee replacement. Near anatomic alignment. No visible complicating feature. IMPRESSION:  Internal fixation across the distal femoral fracture. No visible complicating feature. Electronically Signed   By: Rolm Baptise M.D.   On: 10/15/2020 17:28   DG FEMUR MIN 2 VIEWS LEFT  Result Date: 10/15/2020 CLINICAL DATA:  ORIF left remote fracture EXAM: LEFT FEMUR 2 VIEWS; DG C-ARM 1-60 MIN COMPARISON:  10/14/2020 FINDINGS: Multiple intraoperative spot images demonstrate plate and screw fixation across the distal femoral fracture. Prior knee replacement. Near anatomic alignment. No visible complicating feature. IMPRESSION: Internal fixation across the distal femoral fracture. No visible complicating feature. Electronically Signed   By: Rolm Baptise M.D.   On: 10/15/2020 17:28    Scheduled Meds: . anastrozole  1 mg Oral Daily  . atenolol  25 mg Oral Daily  . buPROPion  300 mg Oral Daily  . carbamazepine  400 mg Oral BID  . cholecalciferol  400 Units Oral Daily  . cyclobenzaprine  10 mg Oral TID  . feeding supplement  237 mL Oral BID BM  . ferrous sulfate  325 mg Oral TID WC  . gabapentin  800 mg Oral QID  . levothyroxine  75 mcg Oral Q0600  . liothyronine  25  mcg Oral Daily  . multivitamin with minerals  1 tablet Oral Daily  . oxyCODONE-acetaminophen  1 tablet Oral QID   And  . oxyCODONE  5 mg Oral QID  . pantoprazole  80 mg Oral Daily  . potassium chloride  10 mEq Oral Daily  . QUEtiapine  400 mg Oral QHS  . senna  1 tablet Oral BID  . vortioxetine HBr  20 mg Oral QHS    Continuous Infusions: . sodium chloride 50 mL/hr at 10/15/20 2013  .  ceFAZolin (ANCEF) IV 2 g (10/16/20 0527)     LOS: 2 days     Cristal Deer, MD [facility]  To reach me or the doctor on call, go to: [email or web address] Password [email or web address]  10/16/2020, 10:30 AM

## 2020-10-16 NOTE — Telephone Encounter (Signed)
Called left message for return to center to receive hip xray report

## 2020-10-17 DIAGNOSIS — I10 Essential (primary) hypertension: Secondary | ICD-10-CM | POA: Diagnosis not present

## 2020-10-17 DIAGNOSIS — E079 Disorder of thyroid, unspecified: Secondary | ICD-10-CM | POA: Diagnosis not present

## 2020-10-17 DIAGNOSIS — S72492D Other fracture of lower end of left femur, subsequent encounter for closed fracture with routine healing: Secondary | ICD-10-CM | POA: Diagnosis not present

## 2020-10-17 LAB — CBC
HCT: 26.4 % — ABNORMAL LOW (ref 36.0–46.0)
Hemoglobin: 8.5 g/dL — ABNORMAL LOW (ref 12.0–15.0)
MCH: 30.4 pg (ref 26.0–34.0)
MCHC: 32.2 g/dL (ref 30.0–36.0)
MCV: 94.3 fL (ref 80.0–100.0)
Platelets: 188 10*3/uL (ref 150–400)
RBC: 2.8 MIL/uL — ABNORMAL LOW (ref 3.87–5.11)
RDW: 13 % (ref 11.5–15.5)
WBC: 6.5 10*3/uL (ref 4.0–10.5)
nRBC: 0 % (ref 0.0–0.2)

## 2020-10-17 LAB — BASIC METABOLIC PANEL
Anion gap: 11 (ref 5–15)
BUN: 17 mg/dL (ref 8–23)
CO2: 25 mmol/L (ref 22–32)
Calcium: 8.3 mg/dL — ABNORMAL LOW (ref 8.9–10.3)
Chloride: 98 mmol/L (ref 98–111)
Creatinine, Ser: 0.75 mg/dL (ref 0.44–1.00)
GFR, Estimated: >60 mL/min (ref >60)
Glucose, Bld: 114 mg/dL — ABNORMAL HIGH (ref 70–99)
Potassium: 3.7 mmol/L (ref 3.5–5.1)
Sodium: 134 mmol/L — ABNORMAL LOW (ref 135–145)

## 2020-10-17 MED ORDER — ENOXAPARIN SODIUM 60 MG/0.6ML ~~LOC~~ SOLN
50.0000 mg | SUBCUTANEOUS | Status: DC
  Administered 2020-10-18 – 2020-10-21 (×4): 50 mg via SUBCUTANEOUS
  Filled 2020-10-17 (×4): qty 0.6

## 2020-10-17 MED ORDER — ENOXAPARIN SODIUM 40 MG/0.4ML ~~LOC~~ SOLN: 40.0000 mg | SUBCUTANEOUS | 0 refills | Status: DC

## 2020-10-17 MED ORDER — GABAPENTIN 400 MG PO CAPS: 800.0000 mg | ORAL_CAPSULE | Freq: Four times a day (QID) | ORAL | 0 refills | Status: DC

## 2020-10-17 MED ORDER — CYCLOBENZAPRINE HCL 10 MG PO TABS: 10.0000 mg | ORAL_TABLET | Freq: Three times a day (TID) | ORAL | 0 refills | Status: DC | PRN

## 2020-10-17 MED ORDER — OXYCODONE-ACETAMINOPHEN 10-325 MG PO TABS: 1.0000 | ORAL_TABLET | Freq: Four times a day (QID) | ORAL | 0 refills | Status: DC

## 2020-10-17 NOTE — Progress Notes (Signed)
Physical Therapy Treatment Patient Details Name: Deborah Cooke MRN: [id number] DOB: 01/03/1951 Today's Date: 10/17/2020    History of Present Illness Deborah Cooke is a 70 y.o. female with medical history significant of anemia, hypothyroidism, HTN, osteoporosis, breast cancer. She say her oncologist today for routine follow-up. Afterward she tripped on a curb and had a hard fall. Subsequently had pain left leg and could not bear wait. EMS transported her to [facility] where x-ray revealed a distal femur fracture left. She is transferred to [facility] for operative repair by Dr. Doreatha Martin ORIF    PT Comments    Patient continues to require heavy assistance for transfers. Patient unable to perform SLR, but instructed on quad sets with tactile cueing. Patient continues to be limited by pain, generalized weakness, impaired balance, and decreased activity tolerance. Continue to recommend SNF for ongoing Physical Therapy.       Follow Up Recommendations  SNF;Supervision/Assistance - 24 hour     Equipment Recommendations  3in1 (PT);Wheelchair cushion (measurements PT);Wheelchair (measurements PT)    Recommendations for Other Services       Precautions / Restrictions Precautions Precautions: Fall Restrictions Weight Bearing Restrictions: Yes LLE Weight Bearing: Touchdown weight bearing    Mobility  Bed Mobility Overal bed mobility: Needs Assistance Bed Mobility: Supine to Sit     Supine to sit: +2 for physical assistance;+2 for safety/equipment;Mod assist     General bed mobility comments: Assist with L  LE and to elevate trunkwith cues for using rail and to scoot hips to EOB and at EOB  Transfers Overall transfer level: Needs assistance Equipment used: Rolling Deborah Cooke (2 wheeled) Transfers: Sit to/from [facility] Sit to Stand: Max assist;+2 physical assistance;+2 safety/equipment Stand pivot transfers: Mod assist;+2 physical assistance;+2 safety/equipment       General  transfer comment: verbal cues for maintaining TDWB of LLE and sequencing to perform stand pivot bed>BSC>chair.  Ambulation/Gait                 Stairs             Wheelchair Mobility    Modified Rankin (Stroke Patients Only)       Balance Overall balance assessment: Needs assistance Sitting-balance support: No upper extremity supported Sitting balance-Leahy Scale: Fair     Standing balance support: Bilateral upper extremity supported;During functional activity Standing balance-Leahy Scale: Poor Standing balance comment: reliant on UE support                            Cognition Arousal/Alertness: Awake/alert Behavior During Therapy: WFL for tasks assessed/performed Overall Cognitive Status: Within Functional Limits for tasks assessed                                        Exercises General Exercises - Lower Extremity Ankle Circles/Pumps: Both;10 reps Quad Sets: Both;10 reps;Supine    General Comments        Pertinent Vitals/Pain Pain Assessment: Faces Faces Pain Scale: Hurts whole lot Pain Location: L LE Pain Descriptors / Indicators: Throbbing;Grimacing;Guarding Pain Intervention(s): Monitored during session;Repositioned    Home Living                      Prior Function            PT Goals (current goals can now be found in the care plan  section) Acute Rehab PT Goals Patient Stated Goal: wants to go home PT Goal Formulation: With patient Time For Goal Achievement: 10/30/20 Potential to Achieve Goals: Fair Progress towards PT goals: Progressing toward goals    Frequency    Min 3X/week      PT Plan Current plan remains appropriate    Co-evaluation              AM-PAC PT "6 Clicks" Mobility   Outcome Measure  Help needed turning from your back to your side while in a flat bed without using bedrails?: A Lot Help needed moving from lying on your back to sitting on the side of a flat bed  without using bedrails?: A Lot Help needed moving to and from a bed to a chair (including a wheelchair)?: A Lot Help needed standing up from a chair using your arms (e.g., wheelchair or bedside chair)?: A Lot Help needed to walk in hospital room?: A Lot Help needed climbing 3-5 steps with a railing? : A Lot 6 Click Score: 12    End of Session Equipment Utilized During Treatment: Gait belt Activity Tolerance: Patient limited by pain Patient left: in chair;with call bell/phone within reach;with chair alarm set Nurse Communication: Mobility status PT Visit Diagnosis: Unsteadiness on feet (R26.81);Other abnormalities of gait and mobility (R26.89);Muscle weakness (generalized) (M62.81);History of falling (Z91.81)     Time: 2778-2423 PT Time Calculation (min) (ACUTE ONLY): 43 min  Charges:  $Therapeutic Activity: 38-52 mins                     Deborah Cooke PT, DPT Acute Rehabilitation Services Pager [phone] Office [phone]    Alda Lea 10/17/2020, 4:53 PM

## 2020-10-17 NOTE — Discharge Instructions (Signed)
Orthopaedic Trauma Service Discharge Instructions   General Discharge Instructions  WEIGHT BEARING STATUS: Touchdown weightbearing left lower extremity  RANGE OF MOTION/ACTIVITY: Ok for hip/knee motion as tolerated  Wound Care: Incisions can be left open to air if there is no drainage. Okay to shower if no drainage from incisions. - Do NOT apply any ointments, solutions or lotions to pin sites or surgical wounds.  These prevent needed drainage and even though solutions like hydrogen peroxide kill bacteria, they also damage cells lining the pin sites that help fight infection.  Applying lotions or ointments can keep the wounds moist and can cause them to breakdown and open up as well. This can increase the risk for infection. When in doubt call the office. - Once the incision is completely dry and without drainage, it may be left open to air out.  Showering may begin 36-48 hours later.  Cleaning gently with soap and water.  DVT/PE prophylaxis: Lovenox  Diet: as you were eating previously.  Can use over the counter stool softeners and bowel preparations, such as Miralax, to help with bowel movements.  Narcotics can be constipating.  Be sure to drink plenty of fluids  PAIN MEDICATION USE AND EXPECTATIONS  You have likely been given narcotic medications to help control your pain.  After a traumatic event that results in an fracture (broken bone) with or without surgery, it is ok to use narcotic pain medications to help control one's pain.  We understand that everyone responds to pain differently and each individual patient will be evaluated on a regular basis for the continued need for narcotic medications. Ideally, narcotic medication use should last no more than 6-8 weeks (coinciding with fracture healing).   As a patient it is your responsibility as well to monitor narcotic medication use and report the amount and frequency you use these medications when you come to your office visit.   We  would also advise that if you are using narcotic medications, you should take a dose prior to therapy to maximize you participation.  IF YOU ARE ON NARCOTIC MEDICATIONS IT IS NOT PERMISSIBLE TO OPERATE A MOTOR VEHICLE (MOTORCYCLE/CAR/TRUCK/MOPED) OR HEAVY MACHINERY DO NOT MIX NARCOTICS WITH OTHER CNS (CENTRAL NERVOUS SYSTEM) DEPRESSANTS SUCH AS ALCOHOL   STOP SMOKING OR USING NICOTINE PRODUCTS!!!!  As discussed nicotine severely impairs your body's ability to heal surgical and traumatic wounds but also impairs bone healing.  Wounds and bone heal by forming microscopic blood vessels (angiogenesis) and nicotine is a vasoconstrictor (essentially, shrinks blood vessels).  Therefore, if vasoconstriction occurs to these microscopic blood vessels they essentially disappear and are unable to deliver necessary nutrients to the healing tissue.  This is one modifiable factor that you can do to dramatically increase your chances of healing your injury.    (This means no smoking, no nicotine gum, patches, etc)  DO NOT USE NONSTEROIDAL ANTI-INFLAMMATORY DRUGS (NSAID'S)  Using products such as Advil (ibuprofen), Aleve (naproxen), Motrin (ibuprofen) for additional pain control during fracture healing can delay and/or prevent the healing response.  If you would like to take over the counter (OTC) medication, Tylenol (acetaminophen) is ok.  However, some narcotic medications that are given for pain control contain acetaminophen as well. Therefore, you should not exceed more than 4000 mg of tylenol in a day if you do not have liver disease.  Also note that there are may OTC medicines, such as cold medicines and allergy medicines that my contain tylenol as well.  If you have  any questions about medications and/or interactions please ask your doctor/PA or your pharmacist.      ICE AND ELEVATE INJURED/OPERATIVE EXTREMITY  Using ice and elevating the injured extremity above your heart can help with swelling and pain  control.  Icing in a pulsatile fashion, such as 20 minutes on and 20 minutes off, can be followed.    Do not place ice directly on skin. Make sure there is a barrier between to skin and the ice pack.    Using frozen items such as frozen peas works well as the conform nicely to the are that needs to be iced.  USE AN ACE WRAP OR TED HOSE FOR SWELLING CONTROL  In addition to icing and elevation, Ace wraps or TED hose are used to help limit and resolve swelling.  It is recommended to use Ace wraps or TED hose until you are informed to stop.    When using Ace Wraps start the wrapping distally (farthest away from the body) and wrap proximally (closer to the body)   Example: If you had surgery on your leg or thing and you do not have a splint on, start the ace wrap at the toes and work your way up to the thigh        If you had surgery on your upper extremity and do not have a splint on, start the ace wrap at your fingers and work your way up to the upper arm  [place]: [phone]   VISIT OUR WEBSITE FOR ADDITIONAL INFORMATION: [email or web address]

## 2020-10-17 NOTE — Progress Notes (Addendum)
PROGRESS NOTE  Deborah Cooke [id number] DOB: Apr 26, 1951 DOA: 10/14/2020 PCP: Rich Fuchs, PA  HPI/Recap of past 24 hours: Deborah Cooke is a 70 y.o. female with medical history significant of anemia, hypothyroidism, HTN, osteoporosis, breast cancer. She say her oncologist today for routine follow-up. Afterward she tripped on a curb and had a hard fall. Subsequently had pain left leg and could not bear wait. EMS transported her to [facility] where x-ray revealed a distal femur fracture left. She is transferred to [facility] for operative repair by Dr. Doreatha Martin. .  ED Course: T 98.3  132/68  HR 106  RR 17. X-ray - left distal comminuted femur fracture. Lab reveals Na 129, glucose 139 CBCD nl, Covid NEGATIVE. EKG with sinus tachycardia.   Subjective:  October 17, 2020: Patient seen and examined at bedside denies any complaints. Pain is tolerable with her pain medication. Orthopedic has seen and signed off and they request follow-up 2 weeks after discharge. Patient is waiting for SNF for rehab as recommended by PT and OT  October 16, 2020: Postop day 1.  Patient seen and examined at bedside pain and also depression she says she feels like rolling into a hole and I. She does not want to go to rehab but she has no choice because her husband is a Pharmacist, community and is away out of state. Her antidepressant will hold due to being n.p.o. but they have been restarted  October 15, 2020, Patient seen and examined at bedside he was admitted last night early this morning for fall with fracture of the left distal femur.  Patient is in pain.  She stated she is waiting for her surgery which was scheduled to be at 230 today.  She is n.p.o.  Also complaining of headache.   Assessment/Plan: Active Problems:   Hypertension   Thyroid disease   Closed fracture of left distal femur (HCC)   Type I or II open comminuted intra-articular fracture of distal end of left femur, initial encounter ([place])  1.  Left distal femoral  fracture.  Status post OPEN REDUCTION INTERNAL FIXATION (ORIF) DISTAL FEMUR FRACTURE (Left)  2.  Hypertension controlled continue home meds next  3.  Hypothyroidism continue current home dose of levothyroxine and Cytomel  4.  Breast cancer patient is on aromatase continue with her  5.  History of osteoporosis.  Currently not on medication  6.  Type 2 diabetes mellitus Patient does not seem to be admitted on medication we will continue low-carb diet  7. Depression. Restart her antidepressants  8.  Hyponatremia improved with hydration  Code Status: Full  Severity of Illness: The appropriate patient status for this patient is INPATIENT. Inpatient status is judged to be reasonable and necessary in order to provide the required intensity of service to ensure the patient's safety. The patient's presenting symptoms, physical exam findings, and initial radiographic and laboratory data in the context of their chronic comorbidities is felt to place them at high risk for further clinical deterioration. Furthermore, it is not anticipated that the patient will be medically stable for discharge from the hospital within 2 midnights of admission. The following factors support the patient status of inpatient.   " The patient's presenting symptoms include fall with fracture. " The worrisome physical exam findings include fracture left femur. " The initial radiographic and laboratory data are worrisome because of fracture left femur. " The chronic co-morbidities include hypertension diabetes.   * I certify that at the point of admission it  is my clinical judgment that the patient will require inpatient hospital care spanning beyond 2 midnights from the point of admission due to high intensity of service, high risk for further deterioration and high frequency of surveillance required.*    Family Communication: Patient  Disposition Plan: To be determined possibly inpatient  rehab   Consultants:  Orthopedic Dr. Doreatha Martin  Procedures: OPEN REDUCTION INTERNAL FIXATION (ORIF) DISTAL FEMUR FRACTURE (Left)  Antimicrobials:  Intraoperative antibiotic  DVT prophylaxis: Heparin   Objective: Vitals:   10/16/20 2129 10/17/20 0422 10/17/20 0834 10/17/20 1158  BP: (!) 131/57 126/72 (!) 114/54 107/61  Pulse: (!) 109 (!) 108 (!) 105 87  Resp: 16 16 17 18   Temp: 100.2 F (37.9 C) 98.8 F (37.1 C) 99.7 F (37.6 C) 99.1 F (37.3 C)  TempSrc: Oral Oral Oral Oral  SpO2: 94% 93% 96% 95%  Weight:      Height:        Intake/Output Summary (Last 24 hours) at 10/17/2020 1843 Last data filed at 10/17/2020 1300 Gross per 24 hour  Intake 1523.11 ml  Output 600 ml  Net 923.11 ml   Filed Weights   10/14/20 1544  Weight: 106.6 kg   Body mass index is 45.9 kg/m.  Exam:  . General: 70 y.o. year-old female well developed well nourished in no acute distress.  Alert and oriented x3.  Obese:In mild distress from pain . Cardiovascular: Regular rate and rhythm with no rubs or gallops.  No thyromegaly or JVD noted.   Marland Kitchen Respiratory: Clear to auscultation with no wheezes or rales. Good inspiratory effort. . Abdomen: Soft nontender nondistended with normal bowel sounds x4 quadrants. . Musculoskeletal: Tender left leg to palpation, no lower extremity edema. 2/4 pulses in all 4 extremities. . Skin: No ulcerative lesions noted or rashes, . Psychiatry: Mood is appropriate for condition and setting    Data Reviewed: CBC: Recent Labs  Lab 10/14/20 1059 10/14/20 1718 10/15/20 0126 10/16/20 0808 10/17/20 0246  WBC 5.9 8.4 9.2 5.8 6.5  NEUTROABS 3.4 6.5  --   --   --   HGB 12.2 12.8 10.7* 9.0* 8.5*  HCT 36.6 37.1 31.1* 26.1* 26.4*  MCV 90.1 89.4 89.6 90.6 94.3  PLT 274 324 276 203 902   Basic Metabolic Panel: Recent Labs  Lab 10/14/20 1059 10/14/20 1718 10/15/20 0126 10/17/20 0246  NA 134* 129* 133* 134*  K 4.8 4.1 4.2 3.7  CL 96* 92* 97* 98  CO2 30 28 26  25   GLUCOSE 90 139* 135* 114*  BUN 21 22 18 17   CREATININE 0.76 0.71 0.62 0.75  CALCIUM 9.1 9.1 8.9 8.3*   GFR: Estimated Creatinine Clearance: 71.2 mL/min (by C-G formula based on SCr of 0.75 mg/dL). Liver Function Tests: Recent Labs  Lab 10/14/20 1059 10/15/20 0126  AST 30 30  ALT 24 23  ALKPHOS 90 78  BILITOT 0.3 0.4  PROT 6.9 5.8*  ALBUMIN 3.9 3.4*   No results for input(s): LIPASE, AMYLASE in the last 168 hours. No results for input(s): AMMONIA in the last 168 hours. Coagulation Profile: No results for input(s): INR, PROTIME in the last 168 hours. Cardiac Enzymes: No results for input(s): CKTOTAL, CKMB, CKMBINDEX, TROPONINI in the last 168 hours. BNP (last 3 results) No results for input(s): PROBNP in the last 8760 hours. HbA1C: No results for input(s): HGBA1C in the last 72 hours. CBG: No results for input(s): GLUCAP in the last 168 hours. Lipid Profile: No results for input(s): CHOL, HDL, LDLCALC,  TRIG, CHOLHDL, LDLDIRECT in the last 72 hours. Thyroid Function Tests: Recent Labs    10/15/20 0201  TSH 2.819   Anemia Panel: No results for input(s): VITAMINB12, FOLATE, FERRITIN, TIBC, IRON, RETICCTPCT in the last 72 hours. Urine analysis:    Component Value Date/Time   COLORURINE YELLOW 04/20/2016 1836   APPEARANCEUR CLEAR 04/20/2016 1836   LABSPEC 1.016 04/20/2016 1836   PHURINE 5.5 04/20/2016 1836   GLUCOSEU NEGATIVE 04/20/2016 1836   HGBUR NEGATIVE 04/20/2016 1836   BILIRUBINUR NEGATIVE 04/20/2016 1836   KETONESUR NEGATIVE 04/20/2016 1836   PROTEINUR NEGATIVE 04/20/2016 1836   NITRITE NEGATIVE 04/20/2016 1836   LEUKOCYTESUR NEGATIVE 04/20/2016 1836   Sepsis Labs: @LABRCNTIP (procalcitonin:4,lacticidven:4)  ) Recent Results (from the past 240 hour(s))  SARS Coronavirus 2 by RT PCR (hospital order, performed in [place] hospital lab) Nasopharyngeal Nasopharyngeal Swab     Status: None   Collection Time: 10/14/20  5:18 PM   Specimen:  Nasopharyngeal Swab  Result Value Ref Range Status   SARS Coronavirus 2 NEGATIVE NEGATIVE Final    Comment: (NOTE) SARS-CoV-2 target nucleic acids are NOT DETECTED.  The SARS-CoV-2 RNA is generally detectable in upper and lower respiratory specimens during the acute phase of infection. The lowest concentration of SARS-CoV-2 viral copies this assay can detect is 250 copies / mL. A negative result does not preclude SARS-CoV-2 infection and should not be used as the sole basis for treatment or other patient management decisions.  A negative result may occur with improper specimen collection / handling, submission of specimen other than nasopharyngeal swab, presence of viral mutation(s) within the areas targeted by this assay, and inadequate number of viral copies (<250 copies / mL). A negative result must be combined with clinical observations, patient history, and epidemiological information.  Fact Sheet for Patients:   StrictlyIdeas.no  Fact Sheet for Healthcare Providers: BankingDealers.co.za  This test is not yet approved or  cleared by the [place] FDA and has been authorized for detection and/or diagnosis of SARS-CoV-2 by FDA under an Emergency Use Authorization (EUA).  This EUA will remain in effect (meaning this test can be used) for the duration of the COVID-19 declaration under Section 564(b)(1) of the Act, 21 U.S.C. section 360bbb-3(b)(1), unless the authorization is terminated or revoked sooner.  Performed at [facility], [address], [place], [place] [address]   Surgical pcr screen     Status: None   Collection Time: 10/15/20  1:27 AM   Specimen: Nasal Mucosa; Nasal Swab  Result Value Ref Range Status   MRSA, PCR NEGATIVE NEGATIVE Final   Staphylococcus aureus NEGATIVE NEGATIVE Final    Comment: (NOTE) The Xpert SA Assay (FDA approved for NASAL specimens in patients 33 years of age and older), is  one component of a comprehensive surveillance program. It is not intended to diagnose infection nor to guide or monitor treatment. Performed at [facility], [place] [address] [place], [place] [address]       Studies: No results found.  Scheduled Meds: . anastrozole  1 mg Oral Daily  . atenolol  25 mg Oral Daily  . buPROPion  300 mg Oral Daily  . carbamazepine  400 mg Oral BID  . cholecalciferol  400 Units Oral Daily  . cyclobenzaprine  10 mg Oral TID  . [START ON 10/18/2020] enoxaparin (LOVENOX) injection  50 mg Subcutaneous Q24H  . feeding supplement  237 mL Oral BID BM  . ferrous sulfate  325 mg Oral  TID WC  . gabapentin  800 mg Oral QID  . levothyroxine  75 mcg Oral Q0600  . liothyronine  25 mcg Oral Daily  . multivitamin with minerals  1 tablet Oral Daily  . oxyCODONE-acetaminophen  1 tablet Oral QID   And  . oxyCODONE  5 mg Oral QID  . pantoprazole  80 mg Oral Daily  . potassium chloride  10 mEq Oral Daily  . QUEtiapine  400 mg Oral QHS  . senna  1 tablet Oral BID  . vortioxetine HBr  20 mg Oral QHS    Continuous Infusions: . sodium chloride 50 mL/hr at 10/17/20 1328     LOS: 3 days     Cristal Deer, MD [facility]  To reach me or the doctor on call, go to: [email or web address] Password [facility]  10/17/2020, 6:43 PM

## 2020-10-17 NOTE — Progress Notes (Signed)
Orthopaedic Trauma Progress Note  SUBJECTIVE: Reports moderate pain about operative site this morning, but does note it is better than yesterday since medications were adjusted.  Is not able to tolerate any knee motion, asking when this will get better. Says therapy was horrible yesterday, leg was extremely painful. No chest pain. No SOB. No nausea/vomiting. + Flatus.  No other complaints.   OBJECTIVE:  Vitals:   10/16/20 2129 10/17/20 0422  BP: (!) 131/57 126/72  Pulse: (!) 109 (!) 108  Resp: 16 16  Temp: 100.2 F (37.9 C) 98.8 F (37.1 C)  SpO2: 94% 93%    General: Sitting up in bed, no acute distress Respiratory: No increased work of breathing.  LLE: Dressing removed, incisions clean, dry, intact.  Tenderness about the knee as expected.  Nontender in the calf or lower leg.  Ankle dorsiflexion/plantarflexion intact. Does not tolerate knee motion secondary to pain.+ EHL.+ FHL.  Compartments soft and compressible.  Endorses sensation light touch distally.  2+ DP pulse  IMAGING: Stable post op imaging.   LABS:  Results for orders placed or performed during the hospital encounter of 10/14/20 (from the past 24 hour(s))  CBC     Status: Abnormal   Collection Time: 10/16/20  8:08 AM  Result Value Ref Range   WBC 5.8 4.0 - 10.5 K/uL   RBC 2.88 (L) 3.87 - 5.11 MIL/uL   Hemoglobin 9.0 (L) 12.0 - 15.0 g/dL   HCT 26.1 (L) 36.0 - 46.0 %   MCV 90.6 80.0 - 100.0 fL   MCH 31.3 26.0 - 34.0 pg   MCHC 34.5 30.0 - 36.0 g/dL   RDW 12.9 11.5 - 15.5 %   Platelets 203 150 - 400 K/uL   nRBC 0.0 0.0 - 0.2 %  CBC     Status: Abnormal   Collection Time: 10/17/20  2:46 AM  Result Value Ref Range   WBC 6.5 4.0 - 10.5 K/uL   RBC 2.80 (L) 3.87 - 5.11 MIL/uL   Hemoglobin 8.5 (L) 12.0 - 15.0 g/dL   HCT 26.4 (L) 36.0 - 46.0 %   MCV 94.3 80.0 - 100.0 fL   MCH 30.4 26.0 - 34.0 pg   MCHC 32.2 30.0 - 36.0 g/dL   RDW 13.0 11.5 - 15.5 %   Platelets 188 150 - 400 K/uL   nRBC 0.0 0.0 - 0.2 %  Basic  metabolic panel     Status: Abnormal   Collection Time: 10/17/20  2:46 AM  Result Value Ref Range   Sodium 134 (L) 135 - 145 mmol/L   Potassium 3.7 3.5 - 5.1 mmol/L   Chloride 98 98 - 111 mmol/L   CO2 25 22 - 32 mmol/L   Glucose, Bld 114 (H) 70 - 99 mg/dL   BUN 17 8 - 23 mg/dL   Creatinine, Ser 0.75 0.44 - 1.00 mg/dL   Calcium 8.3 (L) 8.9 - 10.3 mg/dL   GFR, Estimated >60 >60 mL/min   Anion gap 11 5 - 15    ASSESSMENT: Deborah Cooke is a 70 y.o. female, 2 Days Post-Op s/p ORIF LEFT DISTAL FEMUR FRACTURE  CV/Blood loss: Acute blood loss anemia.  Hemoglobin 8.5 this morning.  Hemodynamically stable  PLAN: Weightbearing: TDWB LLE Incisional and dressing care: ok to leave open to air Showering: May begin showering with assistance 10/18/20 Orthopedic device(s): None  Pain management:  1. Tylenol 325-650 mg q 6 hours PRN 2. Flexeril 10 mg TID 3. Percocet 10-325 mg 4 times daily  4. Neurontin 800 mg TID 5. Dilaudid 0.5-1 mg q 4 hours PRN VTE prophylaxis: Lovenox, SCDs ID: Ancef 2gm post op completed Foley/Lines:  No foley, KVO IVFs Impediments to Fracture Healing: vit D level 37, no supplementation needed Dispo: Therapies as tolerated, PT/OT recommending SNF. Continue to monitor CBC. Patient ok for d/c from ortho standpoint once cleared by medicine and therapies. Have signed and placed d/c rx for pain meds and DVT prophylaxis in chart Follow - up plan: 2 weeks after discharge  Contact information:  Katha Hamming MD, Patrecia Pace PA-C. After hours and holidays please check [email or web address] for group call information for [facility]   Kianni Lheureux A. Ricci Barker, PA-C [phone] (office) [email or web address]

## 2020-10-17 NOTE — Plan of Care (Signed)

## 2020-10-17 NOTE — TOC CAGE-AID Note (Signed)
Transition of Care [facility]) - CAGE-AID Screening   Patient Details  Name: Deborah Cooke MRN: [id number] Date of Birth: 14-Jul-1951   Contact:    Dia Crawford, RN Phone Number: 10/17/2020, 3:08 PM   Clinical Narrative:   Patient denies smoking/drugs, pt reports monthly wine intake but does not feel like this is an issue, declines resources.    CAGE-AID Screening:    Have You Ever Felt You Ought to Cut Down on Your Drinking or Drug Use?: No Have People Annoyed You By Critizing Your Drinking Or Drug Use?: No Have You Felt Bad Or Guilty About Your Drinking Or Drug Use?: No Have You Ever Had a Drink or Used Drugs First Thing In The Morning to Steady Your Nerves or to Get Rid of a Hangover?: No CAGE-AID Score: 0  Substance Abuse Education Offered: Yes  Substance abuse interventions: Patient Counseling (Pt declined resources, she states she does not feel her monthly glass of wine is an issue at this time.)

## 2020-10-18 DIAGNOSIS — I10 Essential (primary) hypertension: Secondary | ICD-10-CM | POA: Diagnosis not present

## 2020-10-18 DIAGNOSIS — E079 Disorder of thyroid, unspecified: Secondary | ICD-10-CM | POA: Diagnosis not present

## 2020-10-18 DIAGNOSIS — S72492D Other fracture of lower end of left femur, subsequent encounter for closed fracture with routine healing: Secondary | ICD-10-CM | POA: Diagnosis not present

## 2020-10-18 MED ORDER — LATANOPROST 0.005 % OP SOLN
1.0000 [drp] | Freq: Every day | OPHTHALMIC | Status: DC
  Administered 2020-10-18 – 2020-10-20 (×3): 1 [drp] via OPHTHALMIC
  Filled 2020-10-18: qty 2.5

## 2020-10-18 MED ORDER — SIMETHICONE 80 MG PO CHEW
80.0000 mg | CHEWABLE_TABLET | Freq: Four times a day (QID) | ORAL | Status: DC | PRN
  Administered 2020-10-18 – 2020-10-19 (×2): 80 mg via ORAL
  Filled 2020-10-18 (×2): qty 1

## 2020-10-18 NOTE — Plan of Care (Signed)

## 2020-10-18 NOTE — Plan of Care (Signed)

## 2020-10-18 NOTE — Plan of Care (Signed)
  Problem: Health Behavior/Discharge Planning: Goal: Ability to manage health-related needs will improve Outcome: Progressing   Problem: Activity: Goal: Risk for activity intolerance will decrease Outcome: Progressing   Problem: Coping: Goal: Level of anxiety will decrease Outcome: Progressing   Problem: Pain Managment: Goal: General experience of comfort will improve Outcome: Progressing   Problem: Safety: Goal: Ability to remain free from injury will improve Outcome: Progressing   Problem: Skin Integrity: Goal: Risk for impaired skin integrity will decrease Outcome: Progressing   

## 2020-10-18 NOTE — Progress Notes (Addendum)
PROGRESS NOTE  Deborah Cooke EHU:[id number] DOB: [birth year] DOA: 10/14/2020 PCP: Heron Nay, PA  HPI/Recap of past 24 hours: Deborah Cooke is a 70 y.o. female with medical history significant of anemia, hypothyroidism, HTN, osteoporosis, breast cancer. She say her oncologist today for routine follow-up. Afterward she tripped on a curb and had a hard fall. Subsequently had pain left leg and could not bear wait. EMS transported her to [facility] where x-ray revealed a distal femur fracture left. She is transferred to [facility] for operative repair by Dr. Jena Gauss. .  ED Course: T 98.3  132/68  HR 106  RR 17. X-ray - left distal comminuted femur fracture. Lab reveals Na 129, glucose 139 CBCD nl, Covid NEGATIVE. EKG with sinus tachycardia.   Subjective: October 18, 2020.  Patient seen and examined at bedside.she told me she is doing ok with Pt, but painful gets diludid to help her after PT but does not want to over use it bc been there before I advised her to push through ,also told her "NO PAIN ,NO GAIN" informed we have not heard any information about rehab place  October 17, 2020: Patient seen and examined at bedside denies any complaints. Pain is tolerable with her pain medication. Orthopedic has seen and signed off and they request follow-up 2 weeks after discharge. Patient is waiting for SNF for rehab as recommended by PT and OT  October 16, 2020: Postop day 1.  Patient seen and examined at bedside pain and also depression she says she feels like rolling into a hole and I. She does not want to go to rehab but she has no choice because her husband is a IT trainer and is away out of state. Her antidepressant will hold due to being n.p.o. but they have been restarted  October 15, 2020, Patient seen and examined at bedside he was admitted last night early this morning for fall with fracture of the left distal femur.  Patient is in pain.  She stated she is waiting for her surgery which was scheduled to  be at 230 today.  She is n.p.o.  Also complaining of headache.   Assessment/Plan: Active Problems:   Hypertension   Thyroid disease   Closed fracture of left distal femur (HCC)   Type I or II open comminuted intra-articular fracture of distal end of left femur, initial encounter (HCC)  1.  Left distal femoral fracture.  Status post OPEN REDUCTION INTERNAL FIXATION (ORIF) DISTAL FEMUR FRACTURE (Left)  2.  Hypertension controlled continue home meds next  3.  Hypothyroidism continue current home dose of levothyroxine and Cytomel  4.  Breast cancer patient is on aromatase continue with her  5.  History of osteoporosis.  Currently not on medication  6.  Type 2 diabetes mellitus Patient does not seem to be admitted on medication we will continue low-carb diet  7. Depression. Restart her antidepressants  8.  Hyponatremia improved with hydration  9.  Glaucoma.  Patient is requesting to restart her glaucoma medicine, Xalatan ordered per home   Code Status: Full  Severity of Illness: The appropriate patient status for this patient is INPATIENT. Inpatient status is judged to be reasonable and necessary in order to provide the required intensity of service to ensure the patient's safety. The patient's presenting symptoms, physical exam findings, and initial radiographic and laboratory data in the context of their chronic comorbidities is felt to place them at high risk for further clinical deterioration. Furthermore, it is not anticipated  that the patient will be medically stable for discharge from the hospital within 2 midnights of admission. The following factors support the patient status of inpatient.   " The patient's presenting symptoms include fall with fracture. " The worrisome physical exam findings include fracture left femur. " The initial radiographic and laboratory data are worrisome because of fracture left femur. " The chronic co-morbidities include hypertension  diabetes.   * I certify that at the point of admission it is my clinical judgment that the patient will require inpatient hospital care spanning beyond 2 midnights from the point of admission due to high intensity of service, high risk for further deterioration and high frequency of surveillance required.*    Family Communication: Patient  Disposition Plan: To be determined possibly inpatient rehab   Consultants:  Orthopedic Dr. Doreatha Martin  Procedures: OPEN REDUCTION INTERNAL FIXATION (ORIF) DISTAL FEMUR FRACTURE (Left)  Antimicrobials:  Intraoperative antibiotic  DVT prophylaxis: Heparin   Objective: Vitals:   10/17/20 2031 10/18/20 0500 10/18/20 0827 10/18/20 0902  BP: (!) 125/93 120/74 (!) 115/93 (!) 115/93  Pulse: 94 86 98 98  Resp: 16 17 14    Temp: 98.7 F (37.1 C) 98 F (36.7 C) 99 F (37.2 C)   TempSrc: Oral Oral Oral   SpO2: 96% 98% 95%   Weight:      Height:        Intake/Output Summary (Last 24 hours) at 10/18/2020 1444 Last data filed at 10/17/2020 2300 Gross per 24 hour  Intake 240 ml  Output 400 ml  Net -160 ml   Filed Weights   10/14/20 1544  Weight: 106.6 kg   Body mass index is 45.9 kg/m.  Exam:  . General: 70 y.o. year-old female well developed well nourished in no acute distress.  Alert and oriented x3.  Obese:In mild distress from pain . Cardiovascular: Regular rate and rhythm with no rubs or gallops.  No thyromegaly or JVD noted.   Marland Kitchen Respiratory: Clear to auscultation with no wheezes or rales. Good inspiratory effort. . Abdomen: Soft nontender nondistended with normal bowel sounds x4 quadrants. . Musculoskeletal: Tender left leg to palpation, no lower extremity edema. 2/4 pulses in all 4 extremities. . Skin: No ulcerative lesions noted or rashes, . Psychiatry: Mood is appropriate for condition and setting    Data Reviewed: CBC: Recent Labs  Lab 10/14/20 1059 10/14/20 1718 10/15/20 0126 10/16/20 0808 10/17/20 0246  WBC 5.9 8.4  9.2 5.8 6.5  NEUTROABS 3.4 6.5  --   --   --   HGB 12.2 12.8 10.7* 9.0* 8.5*  HCT 36.6 37.1 31.1* 26.1* 26.4*  MCV 90.1 89.4 89.6 90.6 94.3  PLT 274 324 276 203 323   Basic Metabolic Panel: Recent Labs  Lab 10/14/20 1059 10/14/20 1718 10/15/20 0126 10/17/20 0246  NA 134* 129* 133* 134*  K 4.8 4.1 4.2 3.7  CL 96* 92* 97* 98  CO2 30 28 26 25   GLUCOSE 90 139* 135* 114*  BUN 21 22 18 17   CREATININE 0.76 0.71 0.62 0.75  CALCIUM 9.1 9.1 8.9 8.3*   GFR: Estimated Creatinine Clearance: 71.2 mL/min (by C-G formula based on SCr of 0.75 mg/dL). Liver Function Tests: Recent Labs  Lab 10/14/20 1059 10/15/20 0126  AST 30 30  ALT 24 23  ALKPHOS 90 78  BILITOT 0.3 0.4  PROT 6.9 5.8*  ALBUMIN 3.9 3.4*   No results for input(s): LIPASE, AMYLASE in the last 168 hours. No results for input(s): AMMONIA in the last  168 hours. Coagulation Profile: No results for input(s): INR, PROTIME in the last 168 hours. Cardiac Enzymes: No results for input(s): CKTOTAL, CKMB, CKMBINDEX, TROPONINI in the last 168 hours. BNP (last 3 results) No results for input(s): PROBNP in the last 8760 hours. HbA1C: No results for input(s): HGBA1C in the last 72 hours. CBG: No results for input(s): GLUCAP in the last 168 hours. Lipid Profile: No results for input(s): CHOL, HDL, LDLCALC, TRIG, CHOLHDL, LDLDIRECT in the last 72 hours. Thyroid Function Tests: No results for input(s): TSH, T4TOTAL, FREET4, T3FREE, THYROIDAB in the last 72 hours. Anemia Panel: No results for input(s): VITAMINB12, FOLATE, FERRITIN, TIBC, IRON, RETICCTPCT in the last 72 hours. Urine analysis:    Component Value Date/Time   COLORURINE YELLOW 04/20/2016 1836   APPEARANCEUR CLEAR 04/20/2016 1836   LABSPEC 1.016 04/20/2016 1836   PHURINE 5.5 04/20/2016 1836   GLUCOSEU NEGATIVE 04/20/2016 1836   HGBUR NEGATIVE 04/20/2016 1836   BILIRUBINUR NEGATIVE 04/20/2016 1836   KETONESUR NEGATIVE 04/20/2016 1836   PROTEINUR NEGATIVE  04/20/2016 1836   NITRITE NEGATIVE 04/20/2016 1836   LEUKOCYTESUR NEGATIVE 04/20/2016 1836   Sepsis Labs: @LABRCNTIP (procalcitonin:4,lacticidven:4)  ) Recent Results (from the past 240 hour(s))  SARS Coronavirus 2 by RT PCR (hospital order, performed in [place] hospital lab) Nasopharyngeal Nasopharyngeal Swab     Status: None   Collection Time: 10/14/20  5:18 PM   Specimen: Nasopharyngeal Swab  Result Value Ref Range Status   SARS Coronavirus 2 NEGATIVE NEGATIVE Final    Comment: (NOTE) SARS-CoV-2 target nucleic acids are NOT DETECTED.  The SARS-CoV-2 RNA is generally detectable in upper and lower respiratory specimens during the acute phase of infection. The lowest concentration of SARS-CoV-2 viral copies this assay can detect is 250 copies / mL. A negative result does not preclude SARS-CoV-2 infection and should not be used as the sole basis for treatment or other patient management decisions.  A negative result may occur with improper specimen collection / handling, submission of specimen other than nasopharyngeal swab, presence of viral mutation(s) within the areas targeted by this assay, and inadequate number of viral copies (<250 copies / mL). A negative result must be combined with clinical observations, patient history, and epidemiological information.  Fact Sheet for Patients:   StrictlyIdeas.no  Fact Sheet for Healthcare Providers: BankingDealers.co.za  This test is not yet approved or  cleared by the [place] FDA and has been authorized for detection and/or diagnosis of SARS-CoV-2 by FDA under an Emergency Use Authorization (EUA).  This EUA will remain in effect (meaning this test can be used) for the duration of the COVID-19 declaration under Section 564(b)(1) of the Act, 21 U.S.C. section 360bbb-3(b)(1), unless the authorization is terminated or revoked sooner.  Performed at [facility], [address] [place], [place] [address]   Surgical pcr screen     Status: None   Collection Time: 10/15/20  1:27 AM   Specimen: Nasal Mucosa; Nasal Swab  Result Value Ref Range Status   MRSA, PCR NEGATIVE NEGATIVE Final   Staphylococcus aureus NEGATIVE NEGATIVE Final    Comment: (NOTE) The Xpert SA Assay (FDA approved for NASAL specimens in patients 49 years of age and older), is one component of a comprehensive surveillance program. It is not intended to diagnose infection nor to guide or monitor treatment. Performed at [facility], [place] [address] [place], [place] [address]       Studies: No results found.  Scheduled Meds: .  anastrozole  1 mg Oral Daily  . atenolol  25 mg Oral Daily  . buPROPion  300 mg Oral Daily  . carbamazepine  400 mg Oral BID  . cholecalciferol  400 Units Oral Daily  . cyclobenzaprine  10 mg Oral TID  . enoxaparin (LOVENOX) injection  50 mg Subcutaneous Q24H  . feeding supplement  237 mL Oral BID BM  . ferrous sulfate  325 mg Oral TID WC  . gabapentin  800 mg Oral QID  . levothyroxine  75 mcg Oral Q0600  . liothyronine  25 mcg Oral Daily  . multivitamin with minerals  1 tablet Oral Daily  . oxyCODONE-acetaminophen  1 tablet Oral QID   And  . oxyCODONE  5 mg Oral QID  . pantoprazole  80 mg Oral Daily  . potassium chloride  10 mEq Oral Daily  . QUEtiapine  400 mg Oral QHS  . senna  1 tablet Oral BID  . vortioxetine HBr  20 mg Oral QHS    Continuous Infusions: . sodium chloride 50 mL/hr at 10/17/20 1328     LOS: 4 days     Cristal Deer, MD [facility]  To reach me or the doctor on call, go to: [email or web address] Password [facility]  10/18/2020, 2:44 PM

## 2020-10-19 ENCOUNTER — Inpatient Hospital Stay (HOSPITAL_COMMUNITY): Payer: Medicare Other

## 2020-10-19 DIAGNOSIS — K567 Ileus, unspecified: Secondary | ICD-10-CM

## 2020-10-19 DIAGNOSIS — I10 Essential (primary) hypertension: Secondary | ICD-10-CM | POA: Diagnosis not present

## 2020-10-19 DIAGNOSIS — E079 Disorder of thyroid, unspecified: Secondary | ICD-10-CM | POA: Diagnosis not present

## 2020-10-19 DIAGNOSIS — S72492D Other fracture of lower end of left femur, subsequent encounter for closed fracture with routine healing: Secondary | ICD-10-CM | POA: Diagnosis not present

## 2020-10-19 LAB — BASIC METABOLIC PANEL
Anion gap: 10 (ref 5–15)
BUN: 14 mg/dL (ref 8–23)
CO2: 25 mmol/L (ref 22–32)
Calcium: 8.7 mg/dL — ABNORMAL LOW (ref 8.9–10.3)
Chloride: 102 mmol/L (ref 98–111)
Creatinine, Ser: 0.59 mg/dL (ref 0.44–1.00)
GFR, Estimated: >60 mL/min (ref >60)
Glucose, Bld: 134 mg/dL — ABNORMAL HIGH (ref 70–99)
Potassium: 4 mmol/L (ref 3.5–5.1)
Sodium: 137 mmol/L (ref 135–145)

## 2020-10-19 LAB — CBC
HCT: 26.7 % — ABNORMAL LOW (ref 36.0–46.0)
Hemoglobin: 8.8 g/dL — ABNORMAL LOW (ref 12.0–15.0)
MCH: 30.8 pg (ref 26.0–34.0)
MCHC: 33 g/dL (ref 30.0–36.0)
MCV: 93.4 fL (ref 80.0–100.0)
Platelets: 249 10*3/uL (ref 150–400)
RBC: 2.86 MIL/uL — ABNORMAL LOW (ref 3.87–5.11)
RDW: 13.4 % (ref 11.5–15.5)
WBC: 5.9 10*3/uL (ref 4.0–10.5)
nRBC: 0 % (ref 0.0–0.2)

## 2020-10-19 MED ORDER — SIMETHICONE 80 MG PO CHEW
80.0000 mg | CHEWABLE_TABLET | Freq: Four times a day (QID) | ORAL | Status: DC
  Administered 2020-10-19 – 2020-10-21 (×7): 80 mg via ORAL
  Filled 2020-10-19 (×7): qty 1

## 2020-10-19 MED ORDER — LACTULOSE 10 GM/15ML PO SOLN
30.0000 g | Freq: Every day | ORAL | Status: DC | PRN
  Administered 2020-10-19: 30 g via ORAL
  Filled 2020-10-19: qty 45

## 2020-10-19 MED ORDER — POLYETHYLENE GLYCOL 3350 17 G PO PACK
17.0000 g | PACK | Freq: Every day | ORAL | Status: DC
  Administered 2020-10-19: 17 g via ORAL
  Filled 2020-10-19: qty 1

## 2020-10-19 MED ORDER — ALUM & MAG HYDROXIDE-SIMETH 200-200-20 MG/5ML PO SUSP
15.0000 mL | Freq: Four times a day (QID) | ORAL | Status: DC | PRN
  Administered 2020-10-19: 15 mL via ORAL
  Filled 2020-10-19: qty 30

## 2020-10-19 NOTE — Progress Notes (Signed)
Dr Kyung Bacca was sent a secure chat message in regards to the patient co of terrible left upper abd pain.  Her abd is distended, audible bowel sounds heard.  The patient is passing a large amount of gas and burping a lot.

## 2020-10-19 NOTE — Plan of Care (Signed)

## 2020-10-19 NOTE — Progress Notes (Signed)
PROGRESS NOTE  Deborah Cooke IAX:[id number] DOB: 17-Jun-1951 DOA: 10/14/2020 PCP: Heron Nay, PA  HPI/Recap of past 24 hours: Deborah Cooke is a 70 y.o. female with medical history significant of anemia, hypothyroidism, HTN, osteoporosis, breast cancer. She say her oncologist today for routine follow-up. Afterward she tripped on a curb and had a hard fall. Subsequently had pain left leg and could not bear wait. EMS transported her to [facility] where x-ray revealed a distal femur fracture left. She is transferred to [facility] for operative repair by Dr. Jena Gauss. .  ED Course: T 98.3  132/68  HR 106  RR 17. X-ray - left distal comminuted femur fracture. Lab reveals Na 129, glucose 139 CBCD nl, Covid NEGATIVE. EKG with sinus tachycardia.   Subjective:  October 19, 2020: Patient seen and examined at bedside nurse called stating that patient is crying in pain abdominal pain and that she has not made a bowel movement and she is in distress her stomach is distended. Patient stated that she has problem with her bowels apparently she has constipation and usually uses MiraLAX but that was not started on this hospital stay.  October 18, 2020.  Patient seen and examined at bedside.she told me she is doing ok with Pt, but painful gets diludid to help her after PT but does not want to over use it bc been there before I advised her to push through ,also told her "NO PAIN ,NO GAIN" informed we have not heard any information about rehab place  October 17, 2020: Patient seen and examined at bedside denies any complaints. Pain is tolerable with her pain medication. Orthopedic has seen and signed off and they request follow-up 2 weeks after discharge. Patient is waiting for SNF for rehab as recommended by PT and OT  October 16, 2020: Postop day 1.  Patient seen and examined at bedside pain and also depression she says she feels like rolling into a hole and I. She does not want to go to rehab but she has no choice  because her husband is a IT trainer and is away out of state. Her antidepressant will hold due to being n.p.o. but they have been restarted  October 15, 2020, Patient seen and examined at bedside he was admitted last night early this morning for fall with fracture of the left distal femur.  Patient is in pain.  She stated she is waiting for her surgery which was scheduled to be at 230 today.  She is n.p.o.  Also complaining of headache.   Assessment/Plan: Active Problems:   Hypertension   Thyroid disease   Closed fracture of left distal femur (HCC)   Type I or II open comminuted intra-articular fracture of distal end of left femur, initial encounter (HCC)  1.  Left distal femoral fracture.  Status post OPEN REDUCTION INTERNAL FIXATION (ORIF) DISTAL FEMUR FRACTURE (Left)  2.  Hypertension controlled continue home meds next  3.  Hypothyroidism continue current home dose of levothyroxine and Cytomel  4.  Breast cancer patient is on aromatase continue with her  5.  History of osteoporosis.  Currently not on medication  6.  Type 2 diabetes mellitus Patient does not seem to be admitted on medication we will continue low-carb diet  7. Depression. Restart her antidepressants  8.  Hyponatremia improved with hydration  9.  Glaucoma.  Patient is requesting to restart her glaucoma medicine, Xalatan ordered per home  10.  Ileus: Patient has distended abdomen with constipation versus obstipation.  She has been given MiraLAX and has not had much resolved chest x-ray has been obtained and it shows ileus versus constipation.  We will start her on n.p.o. except meds and ice chips for decompression.  If no improvement she may have to have NG tube placement for decompression  Code Status: Full  Severity of Illness: The appropriate patient status for this patient is INPATIENT. Inpatient status is judged to be reasonable and necessary in order to provide the required intensity of service to ensure the  patient's safety. The patient's presenting symptoms, physical exam findings, and initial radiographic and laboratory data in the context of their chronic comorbidities is felt to place them at high risk for further clinical deterioration. Furthermore, it is not anticipated that the patient will be medically stable for discharge from the hospital within 2 midnights of admission. The following factors support the patient status of inpatient.   " The patient's presenting symptoms include fall with fracture. " The worrisome physical exam findings include fracture left femur. " The initial radiographic and laboratory data are worrisome because of fracture left femur. " The chronic co-morbidities include hypertension diabetes.   * I certify that at the point of admission it is my clinical judgment that the patient will require inpatient hospital care spanning beyond 2 midnights from the point of admission due to high intensity of service, high risk for further deterioration and high frequency of surveillance required.*    Family Communication: Patient  Disposition Plan: To be determined possibly inpatient rehab   Consultants:  Orthopedic Dr. Doreatha Martin  Procedures: OPEN REDUCTION INTERNAL FIXATION (ORIF) DISTAL FEMUR FRACTURE (Left)  Antimicrobials:  Intraoperative antibiotic  DVT prophylaxis: Heparin   Objective: Vitals:   10/18/20 1747 10/18/20 1932 10/19/20 0442 10/19/20 0743  BP: 126/81 (!) 119/59 121/68 140/89  Pulse: 87 89 87 (!) 102  Resp: 16 17 17 17   Temp: 98.7 F (37.1 C) 98.7 F (37.1 C) 97.8 F (36.6 C) 98.8 F (37.1 C)  TempSrc: Oral Oral Oral Oral  SpO2: 97% 99% 95% 96%  Weight:      Height:        Intake/Output Summary (Last 24 hours) at 10/19/2020 0924 Last data filed at 10/19/2020 0500 Gross per 24 hour  Intake 480 ml  Output 1500 ml  Net -1020 ml   Filed Weights   10/14/20 1544  Weight: 106.6 kg   Body mass index is 45.9 kg/m.  Exam:  . General:  70 y.o. year-old female well developed well nourished in no acute distress.  Alert and oriented x3.  Obese:In mild distress from pain . Cardiovascular: Regular rate and rhythm with no rubs or gallops.  No thyromegaly or JVD noted.   Marland Kitchen Respiratory: Clear to auscultation with no wheezes or rales. Good inspiratory effort. . Abdomen: Soft nontender nondistended with normal bowel sounds x4 quadrants. . Musculoskeletal: Tender left leg to palpation, no lower extremity edema. 2/4 pulses in all 4 extremities. . Skin: No ulcerative lesions noted or rashes, . Psychiatry: Mood is appropriate for condition and setting    Data Reviewed:   1. Numerous dilated gas-filled loops of large bowel may reflect obstruction or ileus.    CBC: Recent Labs  Lab 10/14/20 1059 10/14/20 1718 10/15/20 0126 10/16/20 0808 10/17/20 0246  WBC 5.9 8.4 9.2 5.8 6.5  NEUTROABS 3.4 6.5  --   --   --   HGB 12.2 12.8 10.7* 9.0* 8.5*  HCT 36.6 37.1 31.1* 26.1* 26.4*  MCV 90.1 89.4 89.6  90.6 94.3  PLT 274 324 276 203 673   Basic Metabolic Panel: Recent Labs  Lab 10/14/20 1059 10/14/20 1718 10/15/20 0126 10/17/20 0246  NA 134* 129* 133* 134*  K 4.8 4.1 4.2 3.7  CL 96* 92* 97* 98  CO2 30 28 26 25   GLUCOSE 90 139* 135* 114*  BUN 21 22 18 17   CREATININE 0.76 0.71 0.62 0.75  CALCIUM 9.1 9.1 8.9 8.3*   GFR: Estimated Creatinine Clearance: 71.2 mL/min (by C-G formula based on SCr of 0.75 mg/dL). Liver Function Tests: Recent Labs  Lab 10/14/20 1059 10/15/20 0126  AST 30 30  ALT 24 23  ALKPHOS 90 78  BILITOT 0.3 0.4  PROT 6.9 5.8*  ALBUMIN 3.9 3.4*   No results for input(s): LIPASE, AMYLASE in the last 168 hours. No results for input(s): AMMONIA in the last 168 hours. Coagulation Profile: No results for input(s): INR, PROTIME in the last 168 hours. Cardiac Enzymes: No results for input(s): CKTOTAL, CKMB, CKMBINDEX, TROPONINI in the last 168 hours. BNP (last 3 results) No results for input(s): PROBNP in  the last 8760 hours. HbA1C: No results for input(s): HGBA1C in the last 72 hours. CBG: No results for input(s): GLUCAP in the last 168 hours. Lipid Profile: No results for input(s): CHOL, HDL, LDLCALC, TRIG, CHOLHDL, LDLDIRECT in the last 72 hours. Thyroid Function Tests: No results for input(s): TSH, T4TOTAL, FREET4, T3FREE, THYROIDAB in the last 72 hours. Anemia Panel: No results for input(s): VITAMINB12, FOLATE, FERRITIN, TIBC, IRON, RETICCTPCT in the last 72 hours. Urine analysis:    Component Value Date/Time   COLORURINE YELLOW 04/20/2016 1836   APPEARANCEUR CLEAR 04/20/2016 1836   LABSPEC 1.016 04/20/2016 1836   PHURINE 5.5 04/20/2016 1836   GLUCOSEU NEGATIVE 04/20/2016 1836   HGBUR NEGATIVE 04/20/2016 1836   BILIRUBINUR NEGATIVE 04/20/2016 1836   KETONESUR NEGATIVE 04/20/2016 1836   PROTEINUR NEGATIVE 04/20/2016 1836   NITRITE NEGATIVE 04/20/2016 1836   LEUKOCYTESUR NEGATIVE 04/20/2016 1836   Sepsis Labs: @LABRCNTIP (procalcitonin:4,lacticidven:4)  ) Recent Results (from the past 240 hour(s))  SARS Coronavirus 2 by RT PCR (hospital order, performed in [place] hospital lab) Nasopharyngeal Nasopharyngeal Swab     Status: None   Collection Time: 10/14/20  5:18 PM   Specimen: Nasopharyngeal Swab  Result Value Ref Range Status   SARS Coronavirus 2 NEGATIVE NEGATIVE Final    Comment: (NOTE) SARS-CoV-2 target nucleic acids are NOT DETECTED.  The SARS-CoV-2 RNA is generally detectable in upper and lower respiratory specimens during the acute phase of infection. The lowest concentration of SARS-CoV-2 viral copies this assay can detect is 250 copies / mL. A negative result does not preclude SARS-CoV-2 infection and should not be used as the sole basis for treatment or other patient management decisions.  A negative result may occur with improper specimen collection / handling, submission of specimen other than nasopharyngeal swab, presence of viral mutation(s) within  the areas targeted by this assay, and inadequate number of viral copies (<250 copies / mL). A negative result must be combined with clinical observations, patient history, and epidemiological information.  Fact Sheet for Patients:   StrictlyIdeas.no  Fact Sheet for Healthcare Providers: BankingDealers.co.za  This test is not yet approved or  cleared by the [place] FDA and has been authorized for detection and/or diagnosis of SARS-CoV-2 by FDA under an Emergency Use Authorization (EUA).  This EUA will remain in effect (meaning this test can be used) for the duration of the COVID-19 declaration under Section  564(b)(1) of the Act, 21 U.S.C. section 360bbb-3(b)(1), unless the authorization is terminated or revoked sooner.  Performed at [facility], [address] [place], [place] [address]   Surgical pcr screen     Status: None   Collection Time: 10/15/20  1:27 AM   Specimen: Nasal Mucosa; Nasal Swab  Result Value Ref Range Status   MRSA, PCR NEGATIVE NEGATIVE Final   Staphylococcus aureus NEGATIVE NEGATIVE Final    Comment: (NOTE) The Xpert SA Assay (FDA approved for NASAL specimens in patients 74 years of age and older), is one component of a comprehensive surveillance program. It is not intended to diagnose infection nor to guide or monitor treatment. Performed at [facility], [place] [address] [place], [place] [address]       Studies: No results found.  Scheduled Meds: . anastrozole  1 mg Oral Daily  . atenolol  25 mg Oral Daily  . buPROPion  300 mg Oral Daily  . carbamazepine  400 mg Oral BID  . cholecalciferol  400 Units Oral Daily  . cyclobenzaprine  10 mg Oral TID  . enoxaparin (LOVENOX) injection  50 mg Subcutaneous Q24H  . feeding supplement  237 mL Oral BID BM  . ferrous sulfate  325 mg Oral TID WC  . gabapentin  800 mg Oral QID  . latanoprost  1 drop Both Eyes QHS  . levothyroxine  75  mcg Oral Q0600  . liothyronine  25 mcg Oral Daily  . multivitamin with minerals  1 tablet Oral Daily  . oxyCODONE-acetaminophen  1 tablet Oral QID   And  . oxyCODONE  5 mg Oral QID  . pantoprazole  80 mg Oral Daily  . polyethylene glycol  17 g Oral Daily  . potassium chloride  10 mEq Oral Daily  . QUEtiapine  400 mg Oral QHS  . senna  1 tablet Oral BID  . vortioxetine HBr  20 mg Oral QHS    Continuous Infusions: . sodium chloride 50 mL/hr at 10/17/20 1328     LOS: 5 days     Cristal Deer, MD [facility]  To reach me or the doctor on call, go to: [email or web address] Password [facility]  10/19/2020, 9:24 AM

## 2020-10-19 NOTE — Progress Notes (Signed)
Patient is aware of NPO status except ice chips

## 2020-10-20 DIAGNOSIS — S72492B Other fracture of lower end of left femur, initial encounter for open fracture type I or II: Secondary | ICD-10-CM | POA: Diagnosis not present

## 2020-10-20 DIAGNOSIS — I10 Essential (primary) hypertension: Secondary | ICD-10-CM | POA: Diagnosis not present

## 2020-10-20 DIAGNOSIS — E079 Disorder of thyroid, unspecified: Secondary | ICD-10-CM | POA: Diagnosis not present

## 2020-10-20 DIAGNOSIS — S72492D Other fracture of lower end of left femur, subsequent encounter for closed fracture with routine healing: Secondary | ICD-10-CM | POA: Diagnosis not present

## 2020-10-20 MED ORDER — POLYETHYLENE GLYCOL 3350 17 G PO PACK
17.0000 g | PACK | Freq: Two times a day (BID) | ORAL | Status: DC
  Administered 2020-10-20 – 2020-10-21 (×3): 17 g via ORAL
  Filled 2020-10-20 (×3): qty 1

## 2020-10-20 MED ORDER — SENNA 8.6 MG PO TABS
2.0000 | ORAL_TABLET | Freq: Two times a day (BID) | ORAL | Status: DC
  Administered 2020-10-20 – 2020-10-21 (×3): 17.2 mg via ORAL
  Filled 2020-10-20 (×3): qty 2

## 2020-10-20 MED ORDER — CALCIUM CARBONATE 1250 (500 CA) MG PO TABS
1.0000 | ORAL_TABLET | Freq: Two times a day (BID) | ORAL | Status: DC
  Administered 2020-10-20 – 2020-10-21 (×2): 500 mg via ORAL
  Filled 2020-10-20 (×2): qty 1

## 2020-10-20 MED ORDER — BISACODYL 10 MG RE SUPP
10.0000 mg | Freq: Once | RECTAL | Status: AC
  Administered 2020-10-20: 10 mg via RECTAL
  Filled 2020-10-20: qty 1

## 2020-10-20 NOTE — TOC Transition Note (Signed)
Transition of Care [facility]) - CM/SW Discharge Note   Patient Details  Name: Deborah Cooke MRN: [id number] Date of Birth: 1951/03/22  Transition of Care [facility]) CM/SW Contact:  Amador Cunas, [place] Phone Number: 10/20/2020, 6:07 PM   Clinical Narrative:   SW spoke to pt re PT rec for SNF. Pt with no previous SNF stay. Explained SNF placement process and answered questions. Pt agreeable to SNF for STR, no preferred facility indicated. Will f/u with offers once available. Anticipate dc tomorrow 2/1.   Wandra Feinstein, MSW, LCSW [phone] (coverage)       Final next level of care: Skilled Nursing Facility Barriers to Discharge: No Barriers Identified   Patient Goals and CMS Choice   CMS Medicare.gov Compare Post Acute Care list provided to:: Patient Choice offered to / list presented to : Patient  Discharge Placement                       Discharge Plan and Services                                     Social Determinants of Health (SDOH) Interventions     Readmission Risk Interventions No flowsheet data found.

## 2020-10-20 NOTE — Progress Notes (Signed)
Occupational Therapy Treatment Patient Details Name: Deborah Cooke MRN: [id number] DOB: 07/29/1951 Today's Date: 10/20/2020    History of present illness Deborah Cooke is a 70 y.o. female with medical history significant of anemia, hypothyroidism, HTN, osteoporosis, breast cancer. She say her oncologist today for routine follow-up. Afterward she tripped on a curb and had a hard fall. Subsequently had pain left leg and could not bear wait. EMS transported her to [facility] where x-ray revealed a distal femur fracture left. She is transferred to [facility] for operative repair by Dr. Doreatha Martin ORIF. pt now with ileus.   OT comments  Pt making slow progress with functional goals. Pt requires less assist for bed mobility today but still needs increased time and effort as well as assist managing L LE. Requires Min-Mod A + 2 for standing and SPT to chair/BSC. Total A with peri hygiene standing at [facility] with RW. Pt has difficulty maintaining TDWB LLE during standing/transfers despite max verbal/tactile cues. Pt able to perform grooming and UB selfcare seated I recliner.. Pt with new ileus so encouraged transferring to [facility] for all bathroom needs. Ot will continue to follow acutely to maximize level of function and safety  Follow Up Recommendations  SNF    Equipment Recommendations  3 in 1 bedside commode;Other (comment) (RW, ADL A/E kit)    Recommendations for Other Services      Precautions / Restrictions Precautions Precautions: Fall Restrictions Weight Bearing Restrictions: Yes LLE Weight Bearing: Touchdown weight bearing       Mobility Bed Mobility Overal bed mobility: Needs Assistance Bed Mobility: Supine to Sit     Supine to sit: Min assist;HOB elevated     General bed mobility comments: Assist with L LE to get to EOB, increased time/effort and use of rail. Cues for technique.  Transfers Overall transfer level: Needs assistance Equipment used: Rolling walker (2 wheeled) Transfers: Sit to/from  [facility] Sit to Stand: Mod assist;Min assist;+2 physical assistance;+2 safety/equipment Stand pivot transfers: Mod assist;+2 physical assistance;+2 safety/equipment       General transfer comment: Mod A of 2 to stand from EOB with therapist placing foot under left foot to ensure TDWB (not able to maintain WB status during standing); Min A to stand from [facility] with cues for hand placement/technique. Performed SPT bed to [facility] with mod A of 2, assist with balance, WB status, and RW management. Able to shuffle right foot to get to [facility]. After BSC, pulled chair behind pt to transfer.    Balance Overall balance assessment: Needs assistance Sitting-balance support: Feet supported;Single extremity supported Sitting balance-Leahy Scale: Fair     Standing balance support: During functional activity;Bilateral upper extremity supported Standing balance-Leahy Scale: Poor Standing balance comment: reliant on UE support and external support for static standing with cues for hip extension and upright. Stood for ~3 minutes during pericare, difficult to maintain WB status LLE.                           ADL either performed or assessed with clinical judgement   ADL Overall ADL's : Needs assistance/impaired     Grooming: Wash/dry hands;Wash/dry face;Sitting;Supervision/safety;Set up           Upper Body Dressing : Min guard;Sitting       Toilet Transfer: Moderate assistance;Minimal assistance;+2 for physical assistance;+2 for safety/equipment;RW;Stand-pivot;Cueing for safety;Cueing for sequencing;BSC   Toileting- Clothing Manipulation and Hygiene: Total assistance       Functional mobility during  ADLs: Moderate assistance;Minimal assistance;+2 for physical assistance;+2 for safety/equipment;Cueing for safety;Cueing for sequencing;Rolling walker General ADL Comments: pt transferred to [facility], total A for clothing mgt and posterior peri hygiene. Pt required to stand for  hygiene     Vision Baseline Vision/History: Wears glasses Wears Glasses: At all times Patient Visual Report: No change from baseline     Perception     Praxis      Cognition Arousal/Alertness: Awake/alert Behavior During Therapy: WFL for tasks assessed/performed Overall Cognitive Status: Within Functional Limits for tasks assessed                                          Exercises General Exercises - Lower Extremity Ankle Circles/Pumps: Both;10 reps;AROM;Supine Quad Sets: AROM;Both;10 reps;Supine Hip ABduction/ADduction: AROM;AAROM;Left;5 reps;Seated   Shoulder Instructions       General Comments      Pertinent Vitals/ Pain       Pain Assessment: Faces Faces Pain Scale: Hurts even more Pain Location: L LE in dependent position or with mobility Pain Descriptors / Indicators: Throbbing;Grimacing;Guarding Pain Intervention(s): Limited activity within patient's tolerance;Monitored during session;Repositioned  Home Living                                          Prior Functioning/Environment              Frequency  Min 2X/week        Progress Toward Goals  OT Goals(current goals can now be found in the care plan section)  Progress towards OT goals: Progressing toward goals     Plan Discharge plan remains appropriate    Co-evaluation      Reason for Co-Treatment: For patient/therapist safety;To address functional/ADL transfers PT goals addressed during session: Mobility/safety with mobility;Proper use of DME;Strengthening/ROM;Balance OT goals addressed during session: ADL's and self-care;Proper use of Adaptive equipment and DME      AM-PAC OT "6 Clicks" Daily Activity     Outcome Measure   Help from another person eating meals?: None Help from another person taking care of personal grooming?: None Help from another person toileting, which includes using toliet, bedpan, or urinal?: Total Help from another person  bathing (including washing, rinsing, drying)?: A Lot Help from another person to put on and taking off regular upper body clothing?: A Little Help from another person to put on and taking off regular lower body clothing?: Total 6 Click Score: 15    End of Session Equipment Utilized During Treatment: Gait belt;Rolling walker;Other (comment) (BSC)  OT Visit Diagnosis: Unsteadiness on feet (R26.81);Other abnormalities of gait and mobility (R26.89);History of falling (Z91.81);Muscle weakness (generalized) (M62.81);Pain Pain - Right/Left: Left Pain - part of body: Leg   Activity Tolerance Patient limited by pain   Patient Left in chair;with call bell/phone within reach;with chair alarm set   Nurse Communication          Time: 2334-3568 OT Time Calculation (min): 30 min  Charges: OT General Charges $OT Visit: 1 Visit OT Treatments $Self Care/Home Management : 8-22 mins     Britt Bottom 10/20/2020, 1:28 PM

## 2020-10-20 NOTE — Progress Notes (Signed)
Physical Therapy Treatment Patient Details Name: Deborah Cooke MRN: [id number] DOB: 19-Nov-1950 Today's Date: 10/20/2020    History of Present Illness Deborah Cooke is a 70 y.o. female with medical history significant of anemia, hypothyroidism, HTN, osteoporosis, breast cancer. She say her oncologist today for routine follow-up. Afterward she tripped on a curb and had a hard fall. Subsequently had pain left leg and could not bear wait. EMS transported her to [facility] where x-ray revealed a distal femur fracture left. She is transferred to [facility] for operative repair by Dr. Doreatha Martin ORIF. pt now with ileus.    PT Comments    Patient progressing slowly towards PT goals. Requires less assist for bed mobility today but still needs increased time and effort as well as assist managing LLE. Requires Min-Mod A of 2 for standing and SPT to chair/BSC. Pt has difficulty maintaining TDWB LLE during standing/transfers despite max verbal/tactile cues. Worked on standing balance with upright posture and there ex of LLE to perform throughout the day. Pt with new ileus so encouraged transferring to [facility] for all bathroom needs. Will continue to follow.   Follow Up Recommendations  SNF;Supervision/Assistance - 24 hour     Equipment Recommendations  3in1 (PT);Wheelchair cushion (measurements PT);Wheelchair (measurements PT)    Recommendations for Other Services       Precautions / Restrictions Precautions Precautions: Fall Restrictions Weight Bearing Restrictions: Yes LLE Weight Bearing: Touchdown weight bearing    Mobility  Bed Mobility Overal bed mobility: Needs Assistance Bed Mobility: Supine to Sit     Supine to sit: Min assist;HOB elevated     General bed mobility comments: ASsist with LLE to get to EOB, increased time/effort and use of rail. Cues for technique.  Transfers Overall transfer level: Needs assistance Equipment used: Rolling walker (2 wheeled) Transfers: Sit to/from [employer] Sit to Stand: Mod assist;Min assist;+2 physical assistance;+2 safety/equipment Stand pivot transfers: Mod assist;+2 physical assistance;+2 safety/equipment       General transfer comment: Mod A of 2 to stand from EOB with therapist placing foot under left foot to ensure TDWB (not able to maintain WB status during standing); Min A to stand from [facility] with cues for hand placement/technique. Performed SPT bed to [facility] with mod A of 2, assist with balance, WB status, and RW management. Able to shuffle right foot to get to [facility]. After BSC, pulled chair behind pt to transfer.  Ambulation/Gait             General Gait Details: Unable   Stairs             Wheelchair Mobility    Modified Rankin (Stroke Patients Only)       Balance Overall balance assessment: Needs assistance Sitting-balance support: Feet supported;Single extremity supported Sitting balance-Leahy Scale: Fair     Standing balance support: During functional activity Standing balance-Leahy Scale: Poor Standing balance comment: reliant on UE support and external support for static standing with cues for hip extension and upright. Stood for ~3 minutes during pericare, difficult to maintain WB status LLE.                            Cognition Arousal/Alertness: Awake/alert Behavior During Therapy: WFL for tasks assessed/performed Overall Cognitive Status: Within Functional Limits for tasks assessed  Exercises General Exercises - Lower Extremity Ankle Circles/Pumps: Both;10 reps;AROM;Supine Quad Sets: AROM;Both;10 reps;Supine Hip ABduction/ADduction: AROM;AAROM;Left;5 reps;Seated    General Comments        Pertinent Vitals/Pain Pain Assessment: Faces Faces Pain Scale: Hurts even more Pain Location: L LE in dependent position or with mobility Pain Descriptors / Indicators: Throbbing;Grimacing;Guarding Pain Intervention(s):  Repositioned;Monitored during session;Limited activity within patient's tolerance    Home Living                      Prior Function            PT Goals (current goals can now be found in the care plan section) Progress towards PT goals: Progressing toward goals    Frequency    Min 3X/week      PT Plan Current plan remains appropriate    Co-evaluation PT/OT/SLP Co-Evaluation/Treatment: Yes Reason for Co-Treatment: For patient/therapist safety;To address functional/ADL transfers PT goals addressed during session: Mobility/safety with mobility;Proper use of DME;Strengthening/ROM;Balance        AM-PAC PT "6 Clicks" Mobility   Outcome Measure  Help needed turning from your back to your side while in a flat bed without using bedrails?: A Little Help needed moving from lying on your back to sitting on the side of a flat bed without using bedrails?: A Little Help needed moving to and from a bed to a chair (including a wheelchair)?: A Lot Help needed standing up from a chair using your arms (e.g., wheelchair or bedside chair)?: A Lot Help needed to walk in hospital room?: A Lot Help needed climbing 3-5 steps with a railing? : Total 6 Click Score: 13    End of Session Equipment Utilized During Treatment: Gait belt Activity Tolerance: Patient limited by pain;Patient tolerated treatment well Patient left: in chair;with call bell/phone within reach Nurse Communication: Mobility status;Other (comment) (transfer technique back to bed) PT Visit Diagnosis: Unsteadiness on feet (R26.81);Other abnormalities of gait and mobility (R26.89);Muscle weakness (generalized) (M62.81);History of falling (Z91.81)     Time: 3267-1245 PT Time Calculation (min) (ACUTE ONLY): 29 min  Charges:  $Therapeutic Activity: 8-22 mins                     Deborah Cooke, PT, DPT Acute Rehabilitation Services Pager [phone] Office [phone]       Marguarite Arbour A Sabra Heck 10/20/2020, 12:20  PM

## 2020-10-20 NOTE — Progress Notes (Signed)
PROGRESS NOTE  Deborah Cooke  [id number] DOB: [birth year] DOA: 10/14/2020 PCP: Rich Fuchs, PA   Brief Narrative: Deborah Cooke is a 70 y.o. female with a history of breast CA, osteoporosis, HTN, hypothyroidism, gastric bypass, chronic constipation and redundant transverse colon who tripped on a curb leaving her oncologist's office on 1/25 and presented to [facility] unable to bear weight. XR revealed distal left periprosthetic femur fracture. Orthopedics recommended admission to [facility] and the patient subsequently underwent ORIF 1/26. Due to immobility and need for intensive rehabilitation, SNF disposition is being pursued. On 1/30, the patient developed severe abdominal pain with XR revealing rectal stool burden and colonic gas. This pain resolved and the patient is having her normal bowel movements and no vomiting or nausea with po intake. She is stable for discharge to SNF.   Assessment & Plan: Active Problems:   Hypertension   Thyroid disease   Closed fracture of left distal femur (HCC)   Type I or II open comminuted intra-articular fracture of distal end of left femur, initial encounter ([place])  Closed left distal femur fracture: s/p ORIF by Dr. Doreatha Martin 1/26.  - TDWB LLE - Leave lateral incision open, clean prn. Ok to shower currently.  - SCDs, lovenox for VTE ppx; pain medications to continue as ordered (Rx's in chart per ortho) - Follow up with orthopedics 2 weeks after discharge. Contact information:  Katha Hamming MD, Patrecia Pace PA-C. After hours and holidays please check [email or web address] for group call information for [facility]  Acute blood loss anemia due to operative blood loss: Hgb 10.7 > 8.5 postoperatively without any ongoing bleeding. Normocytic indices, and hgb has stabilized in high 8's.  - Monitor CBC at follow up.  - Empiric iron supplement ordered.  Chronic constipation with postoperative ileus: Ileus has resolved.  - Recommend augmenting regular daily bowel regimen, using  suppository prn, encouraging frequent mobilization.  - Advance diet  Breast CA: 3.5 years out.  - Continue aromatase inhibitor  Depression:  - No changes to medications, continue bupropion, carbamazepine, vortioxetine.  HTN:  - Continue home medication atenolol  Hypothyroidism: TSH 2.819.  - Continue synthroid/cytomel  Osteoporosis:  - Supplement calcium; vitamin D level is 37 so no supplementation indicated in ongoing fashion, giving low level supplement here.  - Outpatient follow up recommended  Hypovolemic hyponatremia: Resolved.   Glaucoma: Chronic - Continue home gtt's  Morbid obesity: Estimated body mass index is 45.9 kg/m as calculated from the following:   Height as of this encounter: 5' (1.524 m).   Weight as of this encounter: 106.6 kg.  DVT prophylaxis: Lovenox Code Status: Full Family Communication: None at bedside Disposition Plan:  Status is: Inpatient  Remains inpatient appropriate because:Unsafe d/c plan  Dispo: The patient is from: Home              Anticipated d/c is to: SNF              Anticipated d/c date is: 1 day              Patient currently is medically stable to d/c.   Difficult to place patient No  Consultants:   Orthopedics  Procedures:   ORIF left distal femur fracture 1/26 by Dr. Doreatha Martin.  Antimicrobials:  Ancef perioperatively   Subjective: Abdomen is without pain, having her regular BMs, eating fine without nausea or vomiting. Denies any blood loss.   Objective: Vitals:   10/19/20 1927 10/20/20 0352 10/20/20 0753 10/20/20 1442  BP: Marland Kitchen)  148/82 (!) 159/88 (!) 149/87 (!) 91/57  Pulse: 93 97 98 79  Resp: 16 18 17 14   Temp: 98.6 F (37 C) 98.9 F (37.2 C) 98.5 F (36.9 C) 98.2 F (36.8 C)  TempSrc: Oral Oral Oral Oral  SpO2: 95% 94% 95% 95%  Weight:      Height:        Intake/Output Summary (Last 24 hours) at 10/20/2020 1629 Last data filed at 10/20/2020 0300 Gross per 24 hour  Intake -  Output 700 ml  Net -700  ml   Filed Weights   10/14/20 1544  Weight: 106.6 kg    Gen: 70 y.o. female in no distress Pulm: Non-labored breathing room air. Clear to auscultation bilaterally.  CV: Regular rate and rhythm. No murmur, rub, or gallop. No JVD, no pitting pedal edema. GI: Abdomen is soft, not tender, obese but not distended with normoactive bowel sounds. Ext: Warm, no deformities, left leg less mobile due to pain but has ROM. Skin: Left lateral knee with surgical incision progressing superiorly with well apposed edges and sutures in place without exudate or erythema. No other rashes, lesions or ulcers Neuro: Alert and oriented. No focal neurological deficits. Psych: Judgement and insight appear normal. Mood & affect appropriate.   Data Reviewed: I have personally reviewed following labs and imaging studies  CBC: Recent Labs  Lab 10/14/20 1059 10/14/20 1718 10/15/20 0126 10/16/20 0808 10/17/20 0246 10/19/20 0910  WBC 5.9 8.4 9.2 5.8 6.5 5.9  NEUTROABS 3.4 6.5  --   --   --   --   HGB 12.2 12.8 10.7* 9.0* 8.5* 8.8*  HCT 36.6 37.1 31.1* 26.1* 26.4* 26.7*  MCV 90.1 89.4 89.6 90.6 94.3 93.4  PLT 274 324 276 203 188 086   Basic Metabolic Panel: Recent Labs  Lab 10/14/20 1059 10/14/20 1718 10/15/20 0126 10/17/20 0246 10/19/20 0910  NA 134* 129* 133* 134* 137  K 4.8 4.1 4.2 3.7 4.0  CL 96* 92* 97* 98 102  CO2 30 28 26 25 25   GLUCOSE 90 139* 135* 114* 134*  BUN 21 22 18 17 14   CREATININE 0.76 0.71 0.62 0.75 0.59  CALCIUM 9.1 9.1 8.9 8.3* 8.7*   GFR: Estimated Creatinine Clearance: 71.2 mL/min (by C-G formula based on SCr of 0.59 mg/dL). Liver Function Tests: Recent Labs  Lab 10/14/20 1059 10/15/20 0126  AST 30 30  ALT 24 23  ALKPHOS 90 78  BILITOT 0.3 0.4  PROT 6.9 5.8*  ALBUMIN 3.9 3.4*   No results for input(s): LIPASE, AMYLASE in the last 168 hours. No results for input(s): AMMONIA in the last 168 hours. Coagulation Profile: No results for input(s): INR, PROTIME in the  last 168 hours. Cardiac Enzymes: No results for input(s): CKTOTAL, CKMB, CKMBINDEX, TROPONINI in the last 168 hours. BNP (last 3 results) No results for input(s): PROBNP in the last 8760 hours. HbA1C: No results for input(s): HGBA1C in the last 72 hours. CBG: No results for input(s): GLUCAP in the last 168 hours. Lipid Profile: No results for input(s): CHOL, HDL, LDLCALC, TRIG, CHOLHDL, LDLDIRECT in the last 72 hours. Thyroid Function Tests: No results for input(s): TSH, T4TOTAL, FREET4, T3FREE, THYROIDAB in the last 72 hours. Anemia Panel: No results for input(s): VITAMINB12, FOLATE, FERRITIN, TIBC, IRON, RETICCTPCT in the last 72 hours. Urine analysis:    Component Value Date/Time   COLORURINE YELLOW 04/20/2016 1836   APPEARANCEUR CLEAR 04/20/2016 1836   LABSPEC 1.016 04/20/2016 1836   PHURINE 5.5 04/20/2016 1836  GLUCOSEU NEGATIVE 04/20/2016 1836   HGBUR NEGATIVE 04/20/2016 1836   BILIRUBINUR NEGATIVE 04/20/2016 1836   KETONESUR NEGATIVE 04/20/2016 1836   PROTEINUR NEGATIVE 04/20/2016 1836   NITRITE NEGATIVE 04/20/2016 1836   LEUKOCYTESUR NEGATIVE 04/20/2016 1836   Recent Results (from the past 240 hour(s))  SARS Coronavirus 2 by RT PCR (hospital order, performed in [facility] hospital lab) Nasopharyngeal Nasopharyngeal Swab     Status: None   Collection Time: 10/14/20  5:18 PM   Specimen: Nasopharyngeal Swab  Result Value Ref Range Status   SARS Coronavirus 2 NEGATIVE NEGATIVE Final    Comment: (NOTE) SARS-CoV-2 target nucleic acids are NOT DETECTED.  The SARS-CoV-2 RNA is generally detectable in upper and lower respiratory specimens during the acute phase of infection. The lowest concentration of SARS-CoV-2 viral copies this assay can detect is 250 copies / mL. A negative result does not preclude SARS-CoV-2 infection and should not be used as the sole basis for treatment or other patient management decisions.  A negative result may occur with improper specimen  collection / handling, submission of specimen other than nasopharyngeal swab, presence of viral mutation(s) within the areas targeted by this assay, and inadequate number of viral copies (<250 copies / mL). A negative result must be combined with clinical observations, patient history, and epidemiological information.  Fact Sheet for Patients:   StrictlyIdeas.no  Fact Sheet for Healthcare Providers: BankingDealers.co.za  This test is not yet approved or  cleared by the [place] FDA and has been authorized for detection and/or diagnosis of SARS-CoV-2 by FDA under an Emergency Use Authorization (EUA).  This EUA will remain in effect (meaning this test can be used) for the duration of the COVID-19 declaration under Section 564(b)(1) of the Act, 21 U.S.C. section 360bbb-3(b)(1), unless the authorization is terminated or revoked sooner.  Performed at [facility], [address] [place], [place] [address]   Surgical pcr screen     Status: None   Collection Time: 10/15/20  1:27 AM   Specimen: Nasal Mucosa; Nasal Swab  Result Value Ref Range Status   MRSA, PCR NEGATIVE NEGATIVE Final   Staphylococcus aureus NEGATIVE NEGATIVE Final    Comment: (NOTE) The Xpert SA Assay (FDA approved for NASAL specimens in patients 58 years of age and older), is one component of a comprehensive surveillance program. It is not intended to diagnose infection nor to guide or monitor treatment. Performed at [facility], [place] [address], [place], [place] [address]       Radiology Studies: DG Abd 1 View  Result Date: 10/19/2020 CLINICAL DATA:  Abdominal pain and distension EXAM: ABDOMEN - 1 VIEW COMPARISON:  CT chest, abdomen pelvis dated 04/01/2016. FINDINGS: Numerous dilated gas-filled loops of large bowel are seen. Air-fluid levels and free intraperitoneal air cannot be excluded on this supine exam. Stool appears to overlie the rectum.  A intramedullary nail is seen in the proximal right humerus. Degenerative changes are seen in the hips and spine. IMPRESSION: 1. Numerous dilated gas-filled loops of large bowel may reflect obstruction or ileus. Electronically Signed   By: Zerita Boers M.D.   On: 10/19/2020 11:07    Scheduled Meds: . anastrozole  1 mg Oral Daily  . atenolol  25 mg Oral Daily  . buPROPion  300 mg Oral Daily  . carbamazepine  400 mg Oral BID  . cholecalciferol  400 Units Oral Daily  . cyclobenzaprine  10 mg Oral TID  . enoxaparin (LOVENOX) injection  50 mg Subcutaneous Q24H  . feeding supplement  237 mL Oral BID BM  . ferrous sulfate  325 mg Oral TID WC  . gabapentin  800 mg Oral QID  . latanoprost  1 drop Both Eyes QHS  . levothyroxine  75 mcg Oral Q0600  . liothyronine  25 mcg Oral Daily  . multivitamin with minerals  1 tablet Oral Daily  . oxyCODONE-acetaminophen  1 tablet Oral QID   And  . oxyCODONE  5 mg Oral QID  . pantoprazole  80 mg Oral Daily  . polyethylene glycol  17 g Oral BID  . potassium chloride  10 mEq Oral Daily  . QUEtiapine  400 mg Oral QHS  . senna  2 tablet Oral BID  . simethicone  80 mg Oral QID  . vortioxetine HBr  20 mg Oral QHS   Continuous Infusions: . sodium chloride 50 mL/hr at 10/17/20 1328     LOS: 6 days   Time spent: 25 minutes.  Patrecia Pour, MD [facility] [email or web address] 10/20/2020, 4:29 PM

## 2020-10-20 NOTE — Plan of Care (Signed)
  Problem: Activity: Goal: Risk for activity intolerance will decrease Outcome: Progressing   Problem: Coping: Goal: Level of anxiety will decrease Outcome: Progressing   Problem: Pain Managment: Goal: General experience of comfort will improve Outcome: Progressing   Problem: Safety: Goal: Ability to remain free from injury will improve Outcome: Progressing   Problem: Skin Integrity: Goal: Risk for impaired skin integrity will decrease Outcome: Progressing   

## 2020-10-20 NOTE — NC FL2 (Signed)
[place] LEVEL OF CARE SCREENING TOOL     IDENTIFICATION  Patient Name: Deborah Cooke Birthdate: 14-Sep-1951 Sex: female Admission Date (Current Location): 10/14/2020  [facility] and [place] Number:  Herbalist and Address:         Provider Number: [id number]  Attending Physician Name and Address:  Patrecia Pour, MD  Relative Name and Phone Number:       Current Level of Care:   Recommended Level of Care:   Prior Approval Number:    Date Approved/Denied:   PASRR Number: [id number]  Discharge Plan: SNF    Current Diagnoses: Patient Active Problem List   Diagnosis Date Noted  . Type I or II open comminuted intra-articular fracture of distal end of left femur, initial encounter ([place]) 10/15/2020  . Closed fracture of left distal femur ([place]) 10/14/2020  . Genetic testing 04/21/2017  . Family history of breast cancer   . Malignant neoplasm of upper-outer quadrant of left breast in female, estrogen receptor positive ([place]) 03/11/2017  . Hypertension   . Thyroid disease   . Osteoporosis   . Anxiety   . Acute blood loss anemia 09/19/2012  . Chronic pain 09/19/2012  . Osteoporosis with fracture 09/19/2012  . Closed supracondylar fracture of right femur ([place]) 09/18/2012    Orientation RESPIRATION BLADDER Height & Weight     Self,Time,Situation,Place  Normal Continent Weight: 235 lb 0.2 oz (106.6 kg) Height:  5' (152.4 cm)  BEHAVIORAL SYMPTOMS/MOOD NEUROLOGICAL BOWEL NUTRITION STATUS      Continent Diet  AMBULATORY STATUS COMMUNICATION OF NEEDS Skin   Limited Assist Verbally Surgical wounds                       Personal Care Assistance Level of Assistance  Bathing,Dressing Bathing Assistance: Limited assistance   Dressing Assistance: Limited assistance     Functional Limitations Info  Sight,Hearing,Speech Sight Info: Adequate Hearing Info: Adequate Speech Info: Adequate    SPECIAL CARE FACTORS FREQUENCY  PT (By licensed PT),OT  (By licensed OT)                    Contractures Contractures Info: Not present    Additional Factors Info  Code Status Code Status Info: FULL CODE             Current Medications (10/20/2020):  This is the current hospital active medication list Current Facility-Administered Medications  Medication Dose Route Frequency Provider Last Rate Last Admin  . acetaminophen (TYLENOL) tablet 325-650 mg  325-650 mg Oral Q6H PRN Delray Alt, PA-C   650 mg at 10/17/20 8366  . albuterol (PROVENTIL) (2.5 MG/3ML) 0.083% nebulizer solution 3 mL  3 mL Inhalation Q4H PRN Delray Alt, PA-C      . alum & mag hydroxide-simeth (MAALOX/MYLANTA) 200-200-20 MG/5ML suspension 15 mL  15 mL Oral Q6H PRN Blount, Xenia T, NP   15 mL at 10/19/20 1214  . anastrozole (ARIMIDEX) tablet 1 mg  1 mg Oral Daily Patrecia Pace A, PA-C   1 mg at 10/20/20 2947  . atenolol (TENORMIN) tablet 25 mg  25 mg Oral Daily Patrecia Pace A, PA-C   25 mg at 10/20/20 6546  . buPROPion (WELLBUTRIN XL) 24 hr tablet 300 mg  300 mg Oral Daily Patrecia Pace A, PA-C   300 mg at 10/20/20 0932  . calcium carbonate (OS-CAL - dosed in mg of elemental calcium) tablet 500 mg of elemental calcium  1 tablet  Oral BID WC Patrecia Pour, MD      . carbamazepine (TEGRETOL XR) 12 hr tablet 400 mg  400 mg Oral BID Patrecia Pace A, PA-C   400 mg at 10/20/20 0934  . cholecalciferol (VITAMIN D3) tablet 400 Units  400 Units Oral Daily Delray Alt, PA-C   400 Units at 10/20/20 8938  . cyclobenzaprine (FLEXERIL) tablet 10 mg  10 mg Oral TID Patrecia Pace A, PA-C   10 mg at 10/20/20 1017  . enoxaparin (LOVENOX) injection 50 mg  50 mg Subcutaneous Q24H Patrecia Pace A, PA-C   50 mg at 10/20/20 5102  . feeding supplement (ENSURE ENLIVE / ENSURE PLUS) liquid 237 mL  237 mL Oral BID BM Patrecia Pace A, PA-C   237 mL at 10/18/20 1323  . ferrous sulfate tablet 325 mg  325 mg Oral TID WC Patrecia Pace A, PA-C   325 mg at 10/20/20 1357  . gabapentin  (NEURONTIN) capsule 800 mg  800 mg Oral QID Patrecia Pace A, PA-C   800 mg at 10/20/20 1357  . HYDROmorphone (DILAUDID) injection 0.5-1 mg  0.5-1 mg Intravenous Q4H PRN Delray Alt, PA-C   0.5 mg at 10/17/20 1546  . hydrOXYzine (ATARAX/VISTARIL) tablet 50 mg  50 mg Oral Daily PRN Patrecia Pace A, PA-C      . lactulose (CHRONULAC) 10 GM/15ML solution 30 g  30 g Oral Daily PRN Cristal Deer, MD   30 g at 10/19/20 1213  . latanoprost (XALATAN) 0.005 % ophthalmic solution 1 drop  1 drop Both Eyes QHS Cristal Deer, MD   1 drop at 10/19/20 2244  . levothyroxine (SYNTHROID) tablet 75 mcg  75 mcg Oral Q0600 Delray Alt, PA-C   75 mcg at 10/20/20 0615  . liothyronine (CYTOMEL) tablet 25 mcg  25 mcg Oral Daily Delray Alt, PA-C   25 mcg at 10/20/20 5852  . multivitamin with minerals tablet 1 tablet  1 tablet Oral Daily Delray Alt, PA-C   1 tablet at 10/20/20 7782  . oxyCODONE-acetaminophen (PERCOCET/ROXICET) 5-325 MG per tablet 1 tablet  1 tablet Oral QID Delray Alt, PA-C   1 tablet at 10/20/20 1357   And  . oxyCODONE (Oxy IR/ROXICODONE) immediate release tablet 5 mg  5 mg Oral QID Patrecia Pace A, PA-C   5 mg at 10/20/20 1357  . pantoprazole (PROTONIX) EC tablet 80 mg  80 mg Oral Daily Patrecia Pace A, PA-C   80 mg at 10/20/20 4235  . polyethylene glycol (MIRALAX / GLYCOLAX) packet 17 g  17 g Oral BID Patrecia Pour, MD   17 g at 10/20/20 0932  . potassium chloride (KLOR-CON) CR tablet 10 mEq  10 mEq Oral Daily Patrecia Pace A, PA-C   10 mEq at 10/20/20 3614  . QUEtiapine (SEROQUEL XR) 24 hr tablet 400 mg  400 mg Oral QHS Cristal Deer, MD   400 mg at 10/19/20 2243  . senna (SENOKOT) tablet 17.2 mg  2 tablet Oral BID Patrecia Pour, MD   17.2 mg at 10/20/20 0933  . simethicone (MYLICON) chewable tablet 80 mg  80 mg Oral QID Cristal Deer, MD   80 mg at 10/20/20 1357  . vortioxetine HBr (TRINTELLIX) tablet 20 mg  20 mg Oral QHS Cristal Deer, MD   20 mg at 10/19/20 2243  .  zolpidem (AMBIEN) tablet 5 mg  5 mg Oral QHS PRN Delray Alt, PA-C   5 mg at 10/16/20 [phone]  Discharge Medications: Please see discharge summary for a list of discharge medications.  Relevant Imaging Results:  Relevant Lab Results:   Additional Information SS# [id number]  Deatra Robinson, [place]

## 2020-10-20 NOTE — Plan of Care (Signed)

## 2020-10-21 DIAGNOSIS — E079 Disorder of thyroid, unspecified: Secondary | ICD-10-CM | POA: Diagnosis not present

## 2020-10-21 DIAGNOSIS — I10 Essential (primary) hypertension: Secondary | ICD-10-CM | POA: Diagnosis not present

## 2020-10-21 DIAGNOSIS — S72492B Other fracture of lower end of left femur, initial encounter for open fracture type I or II: Secondary | ICD-10-CM | POA: Diagnosis not present

## 2020-10-21 DIAGNOSIS — S72492D Other fracture of lower end of left femur, subsequent encounter for closed fracture with routine healing: Secondary | ICD-10-CM | POA: Diagnosis not present

## 2020-10-21 LAB — SARS CORONAVIRUS 2 (TAT 6-24 HRS): SARS Coronavirus 2: NEGATIVE

## 2020-10-21 MED ORDER — POLYETHYLENE GLYCOL 3350 17 G PO PACK: 17.0000 g | PACK | Freq: Two times a day (BID) | ORAL | 0 refills | Status: AC

## 2020-10-21 MED ORDER — SENNA 8.6 MG PO TABS: 2.0000 | ORAL_TABLET | Freq: Every day | ORAL | Status: DC

## 2020-10-21 MED ORDER — ACETAMINOPHEN 325 MG PO TABS: 325.0000 mg | ORAL_TABLET | Freq: Four times a day (QID) | ORAL | Status: DC | PRN

## 2020-10-21 MED ORDER — ENSURE ENLIVE PO LIQD: 237.0000 mL | Freq: Two times a day (BID) | ORAL | Status: DC

## 2020-10-21 NOTE — Progress Notes (Signed)
RN called report to [facility]. All questions answered and RN given phone number in case needed to call back for any questions. Pt belongings gathered to be sent with her. Scripts and discharge instructions in chart. Pt waiting on [facility] for transport.

## 2020-10-21 NOTE — Discharge Summary (Signed)
Physician Discharge Summary  NAYLEEN COWING WUJ:[id number] DOB: [birth year] DOA: 10/14/2020  PCP: Heron Nay, PA  Admit date: 10/14/2020 Discharge date: 10/21/2020  Admitted From: Home Disposition: SNF   Recommendations for Outpatient Follow-up:  1. Follow up with PCP in 1-2 weeks 2. Please obtain BMP/CBC in one week 3. Follow up with orthopedics in 2 weeks from discharge.   Home Health: N/A Equipment/Devices: Per SNF Discharge Condition: Stable CODE STATUS: Full Diet recommendation: Heart healthy  Brief/Interim Summary: Deborah Cooke is a 70 y.o. female with a history of breast CA, osteoporosis, HTN, hypothyroidism, gastric bypass, chronic constipation and redundant transverse colon who tripped on a curb leaving her oncologist's office on 1/25 and presented to [facility] unable to bear weight. XR revealed distal left periprosthetic femur fracture. Orthopedics recommended admission to [name] and the patient subsequently underwent ORIF 1/26. Due to immobility and need for intensive rehabilitation, SNF disposition is being pursued. On 1/30, the patient developed severe abdominal pain with XR revealing rectal stool burden and colonic gas. This pain resolved and the patient is having her normal bowel movements and no vomiting or nausea with po intake. She is stable for discharge to SNF.   Discharge Diagnoses:  Active Problems:   Hypertension   Thyroid disease   Closed fracture of left distal femur (HCC)   Type I or II open comminuted intra-articular fracture of distal end of left femur, initial encounter (HCC)  Closed left distal femur fracture: s/p ORIF by Dr. Jena Gauss 1/26.  - TDWB LLE - Leave lateral incision open, clean prn. Ok to shower currently.  - Lovenox for VTE ppx; pain medications to continue as ordered (Rx's in chart per orthopedics) - Follow up with orthopedics 2 weeks after discharge. Contact information: Truitt Merle MD, Ulyses Southward PA-C. After hours and holidays please check  [email or web address] for group call information for [facility]  Acute blood loss anemia due to operative blood loss: Hgb 10.7 > 8.5 postoperatively without any ongoing bleeding. Normocytic indices, and hgb has stabilized in high 8's.  - Monitor CBC at follow up.  - Empiric iron supplement ordered.  Chronic constipation with postoperative ileus: Ileus has resolved.  - Recommend augmenting regular daily bowel regimen, using suppository prn, encouraging frequent mobilization.   Breast CA: 3.5 years out.  - Continue aromatase inhibitor  Depression:  - No changes to medications, continue bupropion, carbamazepine, vortioxetine.  HTN:  - Continue home medication atenolol  Hypothyroidism: TSH 2.819.  - Continue synthroid/cytomel  Osteoporosis:  - Supplement calcium vitamin D (level is 37)  - Outpatient follow up recommended  Hypovolemic hyponatremia: Resolved.   Glaucoma: Chronic - Continue home gtt  Morbid obesity: Estimated body mass index is 45.9 kg/m   Discharge Instructions  Allergies as of 10/21/2020      Reactions   Nsaids Nausea And Vomiting, Other (See Comments)   GI Upset (ibuprofen included)   Ibuprofen Other (See Comments)   Other reaction(s): Abdominal Pain   Tolmetin Other (See Comments)   Other reaction(s): Abdominal Pain      Medication List    STOP taking these medications   gabapentin 800 MG tablet Commonly known as: NEURONTIN Replaced by: gabapentin 400 MG capsule   linaclotide 290 MCG Caps capsule Commonly known as: LINZESS   tizanidine 6 MG capsule Commonly known as: ZANAFLEX     TAKE these medications   acetaminophen 325 MG tablet Commonly known as: TYLENOL Take 1-2 tablets (325-650 mg total) by mouth every 6 (six) hours  as needed for mild pain, fever or headache.   Aimovig 140 MG/ML Soaj Generic drug: Erenumab-aooe Inject 140 mg as directed every 30 (thirty) days.   anastrozole 1 MG tablet Commonly known as: ARIMIDEX Take 1  tablet (1 mg total) by mouth daily.   atenolol 25 MG tablet Commonly known as: TENORMIN Take 25 mg by mouth daily. For migraine   B COMPLEX 100 PO Take 1 tablet by mouth daily.   Biotin 5000 MCG Caps Take 10,000 mcg by mouth daily.   buPROPion 300 MG 24 hr tablet Commonly known as: WELLBUTRIN XL Take 300 mg by mouth daily.   CALCIUM 600 PO Take 1 tablet by mouth in the morning and at bedtime.   carbamazepine 200 MG 12 hr capsule Commonly known as: CARBATROL Take 400 mg by mouth 2 (two) times daily.   cholecalciferol 10 MCG (400 UNIT) Tabs tablet Commonly known as: VITAMIN D3 Take 400 Units by mouth daily.   cyclobenzaprine 10 MG tablet Commonly known as: FLEXERIL Take 1 tablet (10 mg total) by mouth 3 (three) times daily as needed for muscle spasms.   enoxaparin 40 MG/0.4ML injection Commonly known as: LOVENOX Inject 0.4 mLs (40 mg total) into the skin daily for 28 days.   feeding supplement Liqd Take 237 mLs by mouth 2 (two) times daily between meals.   ferrous sulfate 325 (65 FE) MG EC tablet Take 325 mg by mouth daily with breakfast.   gabapentin 400 MG capsule Commonly known as: NEURONTIN Take 2 capsules (800 mg total) by mouth 4 (four) times daily. Replaces: gabapentin 800 MG tablet   hydrOXYzine 50 MG tablet Commonly known as: ATARAX/VISTARIL Take 50 mg by mouth every 8 (eight) hours as needed for anxiety.   latanoprost 0.005 % ophthalmic solution Commonly known as: XALATAN Place 1 drop into both eyes at bedtime. At night   levothyroxine 88 MCG tablet Commonly known as: SYNTHROID Take 88 mcg by mouth daily.   liothyronine 25 MCG tablet Commonly known as: CYTOMEL Take 25 mcg by mouth daily.   MULTIVITAMIN/IRON PO Take 1 tablet by mouth daily.   omeprazole 40 MG capsule Commonly known as: PRILOSEC Take 40 mg by mouth daily.   oxyCODONE-acetaminophen 10-325 MG tablet Commonly known as: PERCOCET Take 1 tablet by mouth in the morning, at noon,  in the evening, and at bedtime. What changed:   when to take this  reasons to take this   polyethylene glycol 17 g packet Commonly known as: MIRALAX / GLYCOLAX Take 17 g by mouth 2 (two) times daily.   potassium chloride 10 MEQ tablet Commonly known as: KLOR-CON Take 10 mEq by mouth daily.   ProAir HFA 108 (90 Base) MCG/ACT inhaler Generic drug: albuterol Inhale 2 puffs into the lungs every 4 (four) hours as needed for shortness of breath.   QUEtiapine 400 MG 24 hr tablet Commonly known as: SEROQUEL XR Take 400 mg by mouth at bedtime.   senna 8.6 MG Tabs tablet Commonly known as: SENOKOT Take 2 tablets (17.2 mg total) by mouth daily.   vortioxetine HBr 20 MG Tabs tablet Commonly known as: TRINTELLIX Take 20 mg by mouth at bedtime.       Contact information for follow-up providers    Haddix, Thomasene Lot, MD. Schedule an appointment as soon as possible for a visit in 2 week(s).   Specialty: Orthopedic Surgery Why: for repeat x-rays and wound check Contact information: [place] [address] [phone]  Rich Fuchs, PA Follow up.   Specialty: Physician Assistant Contact information: [address] [place] [address] [phone]            Contact information for after-discharge care    Destination    [facility] .   Service: Skilled Nursing Contact information: [place] [address] [phone]                 Allergies  Allergen Reactions  . Nsaids Nausea And Vomiting and Other (See Comments)    GI Upset (ibuprofen included)  . Ibuprofen Other (See Comments)    Other reaction(s): Abdominal Pain  . Tolmetin Other (See Comments)    Other reaction(s): Abdominal Pain     Consultations:  Orthopedics  Procedures/Studies: DG Abd 1 View  Result Date: 10/19/2020 CLINICAL DATA:  Abdominal pain and distension EXAM: ABDOMEN - 1 VIEW COMPARISON:  CT chest, abdomen  pelvis dated 04/01/2016. FINDINGS: Numerous dilated gas-filled loops of large bowel are seen. Air-fluid levels and free intraperitoneal air cannot be excluded on this supine exam. Stool appears to overlie the rectum. A intramedullary nail is seen in the proximal right humerus. Degenerative changes are seen in the hips and spine. IMPRESSION: 1. Numerous dilated gas-filled loops of large bowel may reflect obstruction or ileus. Electronically Signed   By: Zerita Boers M.D.   On: 10/19/2020 11:07   Chest Portable 1 View  Result Date: 10/15/2020 CLINICAL DATA:  Femoral fracture EXAM: PORTABLE CHEST 1 VIEW COMPARISON:  04/20/2016 FINDINGS: Heart size and pulmonary vascularity are normal for technique. Interstitial pattern to the lungs is similar to prior study, likely fibrosis. No airspace disease or consolidation. No pleural effusions. No pneumothorax. Mediastinal contours appear intact. 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 No evidence of active pulmonary disease. Electronically Signed   By: Lucienne Capers M.D.   On: 10/15/2020 03:48   DG Knee Complete 4 Views Left  Result Date: 10/14/2020 CLINICAL DATA:  Fall with pain. EXAM: LEFT KNEE - COMPLETE 4+ VIEW COMPARISON:  12/31/2015 FINDINGS: Previous total knee arthroplasty. Comminuted fracture of the distal femur at the metaphysis. Patella not well demonstrated. No acute fracture of the proximal tibia or fibula. IMPRESSION: Comminuted fracture of the distal femur at the metaphysis. Patella not well demonstrated. No acute fracture of the proximal tibia or fibula. Electronically Signed   By: Nelson Chimes M.D.   On: 10/14/2020 16:38   DG Knee Left Port  Result Date: 10/15/2020 CLINICAL DATA:  70 year old female with left lower extremity fracture EXAM: PORTABLE LEFT KNEE - 1-2 VIEW COMPARISON:  Intraoperative fluoroscopic radiograph dated 09/25/2020. FINDINGS: Open reduction and internal fixation of distal femoral fracture with sideplate fixation and  screws. The hardware is intact. There is approximately 3 mm gap between the distal aspect of the sideplate and distal femoral cortex. 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 The bones are osteopenic. The soft tissues are grossly unremarkable. IMPRESSION: Open reduction and internal fixation of the distal femoral fracture. Electronically Signed   By: Anner Crete M.D.   On: 10/15/2020 18:39   DG C-Arm 1-60 Min  Result Date: 10/15/2020 CLINICAL DATA:  ORIF left remote fracture EXAM: LEFT FEMUR 2 VIEWS; DG C-ARM 1-60 MIN COMPARISON:  10/14/2020 FINDINGS: Multiple intraoperative spot images demonstrate plate and screw fixation across the distal femoral fracture. Prior knee replacement. Near anatomic alignment. No visible complicating feature. IMPRESSION: Internal fixation across the distal femoral fracture. No visible complicating feature. Electronically Signed  By: Rolm Baptise M.D.   On: 10/15/2020 17:28   DG HIP UNILAT WITH PELVIS MIN 4 VIEWS LEFT  Result Date: 10/14/2020 CLINICAL DATA:  Fall with left-sided pain EXAM: DG HIP (WITH OR WITHOUT PELVIS) 4+V LEFT COMPARISON:  None. FINDINGS: No acute fracture. Chronic osteoarthritis of both hips, left worse than right. Intramedullary nail of the right femur. IMPRESSION: No acute finding. Chronic osteoarthritis of both hips, left worse than right. Electronically Signed   By: Nelson Chimes M.D.   On: 10/14/2020 16:39   DG FEMUR MIN 2 VIEWS LEFT  Result Date: 10/15/2020 CLINICAL DATA:  ORIF left remote fracture EXAM: LEFT FEMUR 2 VIEWS; DG C-ARM 1-60 MIN COMPARISON:  10/14/2020 FINDINGS: Multiple intraoperative spot images demonstrate plate and screw fixation across the distal femoral fracture. Prior knee replacement. Near anatomic alignment. No visible complicating feature. IMPRESSION: Internal fixation across the distal femoral fracture. No visible complicating feature. Electronically Signed   By: Rolm Baptise M.D.   On: 10/15/2020 17:28     ORIF left  distal femur fracture 1/26 by Dr. Doreatha Martin.   Subjective: Feels well, pain in left knee/leg is controlled, still needs assistance and wants to rehabilitate as much as possible. No chest pain or dyspnea. No abdominal pain, N/V. LBM yesterday.  Discharge Exam: Vitals:   10/21/20 0740 10/21/20 0921  BP: 138/81 118/72  Pulse: 94 95  Resp: 17   Temp: 98.5 F (36.9 C) 99 F (37.2 C)  SpO2: 94% 96%   General: Pt is alert, awake, not in acute distress Cardiovascular: RRR, S1/S2 +, no rubs, no gallops Respiratory: CTA bilaterally, no wheezing, no rhonchi Abdominal: Soft, NT, ND, bowel sounds + Extremities: No edema, no cyanosis. Left lateral LE about the knee and superiorly with longitudinal surgical incision w/sutures c/d/i with well apposed edges, no exudate, and no erythema.  Labs: BNP (last 3 results) No results for input(s): BNP in the last 8760 hours. Basic Metabolic Panel: Recent Labs  Lab 10/14/20 1059 10/14/20 1718 10/15/20 0126 10/17/20 0246 10/19/20 0910  NA 134* 129* 133* 134* 137  K 4.8 4.1 4.2 3.7 4.0  CL 96* 92* 97* 98 102  CO2 30 28 26 25 25   GLUCOSE 90 139* 135* 114* 134*  BUN 21 22 18 17 14   CREATININE 0.76 0.71 0.62 0.75 0.59  CALCIUM 9.1 9.1 8.9 8.3* 8.7*   Liver Function Tests: Recent Labs  Lab 10/14/20 1059 10/15/20 0126  AST 30 30  ALT 24 23  ALKPHOS 90 78  BILITOT 0.3 0.4  PROT 6.9 5.8*  ALBUMIN 3.9 3.4*   No results for input(s): LIPASE, AMYLASE in the last 168 hours. No results for input(s): AMMONIA in the last 168 hours. CBC: Recent Labs  Lab 10/14/20 1059 10/14/20 1718 10/15/20 0126 10/16/20 0808 10/17/20 0246 10/19/20 0910  WBC 5.9 8.4 9.2 5.8 6.5 5.9  NEUTROABS 3.4 6.5  --   --   --   --   HGB 12.2 12.8 10.7* 9.0* 8.5* 8.8*  HCT 36.6 37.1 31.1* 26.1* 26.4* 26.7*  MCV 90.1 89.4 89.6 90.6 94.3 93.4  PLT 274 324 276 203 188 249   Cardiac Enzymes: No results for input(s): CKTOTAL, CKMB, CKMBINDEX, TROPONINI in the last 168  hours. BNP: Invalid input(s): POCBNP CBG: No results for input(s): GLUCAP in the last 168 hours. D-Dimer No results for input(s): DDIMER in the last 72 hours. Hgb A1c No results for input(s): HGBA1C in the last 72 hours. Lipid Profile No results for input(s):  CHOL, HDL, LDLCALC, TRIG, CHOLHDL, LDLDIRECT in the last 72 hours. Thyroid function studies No results for input(s): TSH, T4TOTAL, T3FREE, THYROIDAB in the last 72 hours.  Invalid input(s): FREET3 Anemia work up No results for input(s): VITAMINB12, FOLATE, FERRITIN, TIBC, IRON, RETICCTPCT in the last 72 hours. Urinalysis    Component Value Date/Time   COLORURINE YELLOW 04/20/2016 1836   APPEARANCEUR CLEAR 04/20/2016 1836   LABSPEC 1.016 04/20/2016 1836   PHURINE 5.5 04/20/2016 1836   GLUCOSEU NEGATIVE 04/20/2016 1836   HGBUR NEGATIVE 04/20/2016 1836   BILIRUBINUR NEGATIVE 04/20/2016 1836   KETONESUR NEGATIVE 04/20/2016 1836   PROTEINUR NEGATIVE 04/20/2016 1836   NITRITE NEGATIVE 04/20/2016 1836   LEUKOCYTESUR NEGATIVE 04/20/2016 1836    Microbiology Recent Results (from the past 240 hour(s))  SARS Coronavirus 2 by RT PCR (hospital order, performed in [facility] hospital lab) Nasopharyngeal Nasopharyngeal Swab     Status: None   Collection Time: 10/14/20  5:18 PM   Specimen: Nasopharyngeal Swab  Result Value Ref Range Status   SARS Coronavirus 2 NEGATIVE NEGATIVE Final    Comment: (NOTE) SARS-CoV-2 target nucleic acids are NOT DETECTED.  The SARS-CoV-2 RNA is generally detectable in upper and lower respiratory specimens during the acute phase of infection. The lowest concentration of SARS-CoV-2 viral copies this assay can detect is 250 copies / mL. A negative result does not preclude SARS-CoV-2 infection and should not be used as the sole basis for treatment or other patient management decisions.  A negative result may occur with improper specimen collection / handling, submission of specimen other than  nasopharyngeal swab, presence of viral mutation(s) within the areas targeted by this assay, and inadequate number of viral copies (<250 copies / mL). A negative result must be combined with clinical observations, patient history, and epidemiological information.  Fact Sheet for Patients:   StrictlyIdeas.no  Fact Sheet for Healthcare Providers: BankingDealers.co.za  This test is not yet approved or  cleared by the [place] FDA and has been authorized for detection and/or diagnosis of SARS-CoV-2 by FDA under an Emergency Use Authorization (EUA).  This EUA will remain in effect (meaning this test can be used) for the duration of the COVID-19 declaration under Section 564(b)(1) of the Act, 21 U.S.C. section 360bbb-3(b)(1), unless the authorization is terminated or revoked sooner.  Performed at [facility], [address] [place], [place] [address]   Surgical pcr screen     Status: None   Collection Time: 10/15/20  1:27 AM   Specimen: Nasal Mucosa; Nasal Swab  Result Value Ref Range Status   MRSA, PCR NEGATIVE NEGATIVE Final   Staphylococcus aureus NEGATIVE NEGATIVE Final    Comment: (NOTE) The Xpert SA Assay (FDA approved for NASAL specimens in patients 62 years of age and older), is one component of a comprehensive surveillance program. It is not intended to diagnose infection nor to guide or monitor treatment. Performed at [facility], [place] [address] [place], [place] [address]     Time coordinating discharge: Approximately 40 minutes  Patrecia Pour, MD  [facility] 10/21/2020, 10:44 AM

## 2020-10-21 NOTE — Progress Notes (Addendum)
Nutrition Follow-up  DOCUMENTATION CODES:   Morbid obesity  INTERVENTION:   -D/c Ensure Enlive po BID, each supplement provides 350 kcal and 20 grams of protein -Continue MVI with minerals daily  NUTRITION DIAGNOSIS:   Increased nutrient needs related to hip fracture,post-op healing as evidenced by estimated needs.  Ongoing  GOAL:   Patient will meet greater than or equal to 90% of their needs  Progressing   MONITOR:   PO intake,Supplement acceptance  REASON FOR ASSESSMENT:   Consult Hip fracture protocol  ASSESSMENT:   70 yo female admitted with L distal femur fracture s/p fall. PMH includes anemia, breast cancer, GERD, hypothyroidism, osteoporosis.  1/26- s/p Open reduction internal fixation of left distal femur fracture  Reviewed I/O's: -102 ml x 24 hours and -683 ml since admission  UOP: 100 ml x 24 hours  Pt unavailable at time of attempted contact.  Pt remains with good appetite. Noted meal completions 85-100%. She has been refusing Ensure Enlive supplements.   Per MD notes, pt is medically stable for discharge and awaiting SNF placement.   Medications reviewed and include calcium carbonate, vitamin D3, ferrous sulfate, and miralax.  Labs reviewed.   Diet Order:   Diet Order            Diet regular Room service appropriate? Yes; Fluid consistency: Thin  Diet effective now                 EDUCATION NEEDS:   No education needs have been identified at this time  Skin:  Skin Assessment: Skin Integrity Issues: Skin Integrity Issues:: Incisions Incisions: closed lt thigh  Last BM:  10/20/20  Height:   Ht Readings from Last 1 Encounters:  10/14/20 5' (1.524 m)    Weight:   Wt Readings from Last 1 Encounters:  10/14/20 106.6 kg    Ideal Body Weight:  45.5 kg  BMI:  Body mass index is 45.9 kg/m.  Estimated Nutritional Needs:   Kcal:  1600-1800  Protein:  90-110 gm  Fluid:  >/= 1.8 L    Loistine Chance, RD, LDN, [place] Registered  Dietitian II Certified Diabetes Care and Education Specialist Please refer to [facility] for RD and/or RD on-call/weekend/after hours pager

## 2020-10-21 NOTE — TOC Transition Note (Signed)
Transition of Care [facility]) - CM/SW Discharge Note   Patient Details  Name: Deborah Cooke MRN: [id number] Date of Birth: Feb 10, 1951  [facility] CM/SW Contact:  Amador Cunas, [place] Phone Number: 10/21/2020, 3:32 PM   Clinical Narrative:  Spoke to pt and provided current SNF offers. Pt agreeable to [facility]. Spoke to [place] at [facility] who confirmed they are prepared to admit pt today. Candace Cruise obtained (ref # [id number]) and provided to [facility]. RN provided with number for report. SW signing off at dc.   Wandra Feinstein, MSW, LCSW [phone] (coverage)        Final next level of care: Skilled Nursing Facility Barriers to Discharge: No Barriers Identified   Patient Goals and CMS Choice   CMS Medicare.gov Compare Post Acute Care list provided to:: Patient Choice offered to / list presented to : Patient  Discharge Placement              Patient chooses bed at: [facility] Patient to be transferred to facility by: [facility]   Patient and family notified of of transfer: 10/21/20  Discharge Plan and Services                                     Social Determinants of Health (SDOH) Interventions     Readmission Risk Interventions No flowsheet data found.

## 2020-10-21 NOTE — Plan of Care (Signed)
  Problem: Activity: Goal: Risk for activity intolerance will decrease Outcome: Progressing   Problem: Pain Managment: Goal: General experience of comfort will improve Outcome: Progressing   Problem: Safety: Goal: Ability to remain free from injury will improve Outcome: Progressing   

## 2020-10-23 DIAGNOSIS — C50919 Malignant neoplasm of unspecified site of unspecified female breast: Secondary | ICD-10-CM

## 2020-10-23 DIAGNOSIS — G894 Chronic pain syndrome: Secondary | ICD-10-CM

## 2020-10-23 DIAGNOSIS — S7292XA Unspecified fracture of left femur, initial encounter for closed fracture: Secondary | ICD-10-CM

## 2020-10-23 DIAGNOSIS — F3340 Major depressive disorder, recurrent, in remission, unspecified: Secondary | ICD-10-CM

## 2020-10-23 DIAGNOSIS — F1129 Opioid dependence with unspecified opioid-induced disorder: Secondary | ICD-10-CM

## 2020-10-27 ENCOUNTER — Other Ambulatory Visit: Payer: Self-pay | Admitting: Hematology

## 2020-10-27 DIAGNOSIS — C50412 Malignant neoplasm of upper-outer quadrant of left female breast: Secondary | ICD-10-CM

## 2020-10-27 DIAGNOSIS — Z170 Estrogen receptor positive status [ER+]: Secondary | ICD-10-CM

## 2020-10-29 ENCOUNTER — Telehealth: Payer: Self-pay | Admitting: Hematology

## 2020-10-29 NOTE — Telephone Encounter (Signed)
Called pt per 2/8 sch msg - pt is aware of appt date and time

## 2021-01-08 ENCOUNTER — Other Ambulatory Visit: Payer: Self-pay

## 2021-01-08 DIAGNOSIS — Z170 Estrogen receptor positive status [ER+]: Secondary | ICD-10-CM

## 2021-01-08 DIAGNOSIS — C50412 Malignant neoplasm of upper-outer quadrant of left female breast: Secondary | ICD-10-CM

## 2021-01-08 MED ORDER — ANASTROZOLE 1 MG PO TABS: ORAL_TABLET | ORAL | 1 refills | Status: DC

## 2021-03-26 ENCOUNTER — Other Ambulatory Visit: Payer: Self-pay | Admitting: Student

## 2021-03-26 DIAGNOSIS — S72402D Unspecified fracture of lower end of left femur, subsequent encounter for closed fracture with routine healing: Secondary | ICD-10-CM

## 2021-04-03 ENCOUNTER — Other Ambulatory Visit: Payer: Medicare Other

## 2021-04-03 ENCOUNTER — Other Ambulatory Visit: Payer: Self-pay

## 2021-04-03 ENCOUNTER — Ambulatory Visit
Admission: RE | Admit: 2021-04-03 | Discharge: 2021-04-03 | Disposition: A | Discharge status: Home / Self Care | Payer: Medicare Other | Source: Ambulatory Visit | Attending: Student | Admitting: Student

## 2021-04-03 DIAGNOSIS — S72402D Unspecified fracture of lower end of left femur, subsequent encounter for closed fracture with routine healing: Secondary | ICD-10-CM

## 2021-04-13 ENCOUNTER — Encounter: Payer: Self-pay | Admitting: Hematology

## 2021-04-13 ENCOUNTER — Inpatient Hospital Stay: Payer: Medicare Other | Attending: Hematology | Admitting: Hematology

## 2021-04-13 ENCOUNTER — Other Ambulatory Visit: Payer: Self-pay

## 2021-04-13 ENCOUNTER — Inpatient Hospital Stay: Payer: Medicare Other

## 2021-04-13 VITALS — BP 167/99 | HR 86 | Temp 98.4°F | Resp 18

## 2021-04-13 DIAGNOSIS — Z9884 Bariatric surgery status: Secondary | ICD-10-CM | POA: Diagnosis not present

## 2021-04-13 DIAGNOSIS — Z7901 Long term (current) use of anticoagulants: Secondary | ICD-10-CM | POA: Insufficient documentation

## 2021-04-13 DIAGNOSIS — F419 Anxiety disorder, unspecified: Secondary | ICD-10-CM | POA: Diagnosis not present

## 2021-04-13 DIAGNOSIS — C50412 Malignant neoplasm of upper-outer quadrant of left female breast: Secondary | ICD-10-CM

## 2021-04-13 DIAGNOSIS — M8580 Other specified disorders of bone density and structure, unspecified site: Secondary | ICD-10-CM | POA: Diagnosis not present

## 2021-04-13 DIAGNOSIS — F32A Depression, unspecified: Secondary | ICD-10-CM | POA: Diagnosis not present

## 2021-04-13 DIAGNOSIS — Z79811 Long term (current) use of aromatase inhibitors: Secondary | ICD-10-CM | POA: Insufficient documentation

## 2021-04-13 DIAGNOSIS — Z79899 Other long term (current) drug therapy: Secondary | ICD-10-CM | POA: Diagnosis not present

## 2021-04-13 DIAGNOSIS — E039 Hypothyroidism, unspecified: Secondary | ICD-10-CM | POA: Diagnosis not present

## 2021-04-13 DIAGNOSIS — Z170 Estrogen receptor positive status [ER+]: Secondary | ICD-10-CM

## 2021-04-13 DIAGNOSIS — E871 Hypo-osmolality and hyponatremia: Secondary | ICD-10-CM | POA: Insufficient documentation

## 2021-04-13 LAB — COMPREHENSIVE METABOLIC PANEL
ALT: 19 U/L (ref 0–44)
AST: 24 U/L (ref 15–41)
Albumin: 4 g/dL (ref 3.5–5.0)
Alkaline Phosphatase: 144 U/L — ABNORMAL HIGH (ref 38–126)
Anion gap: 10 (ref 5–15)
BUN: 13 mg/dL (ref 8–23)
CO2: 26 mmol/L (ref 22–32)
Calcium: 9.5 mg/dL (ref 8.9–10.3)
Chloride: 87 mmol/L — ABNORMAL LOW (ref 98–111)
Creatinine, Ser: 0.7 mg/dL (ref 0.44–1.00)
GFR, Estimated: >60 mL/min (ref >60)
Glucose, Bld: 96 mg/dL (ref 70–99)
Potassium: 4.6 mmol/L (ref 3.5–5.1)
Sodium: 123 mmol/L — ABNORMAL LOW (ref 135–145)
Total Bilirubin: 0.4 mg/dL (ref 0.3–1.2)
Total Protein: 7.2 g/dL (ref 6.5–8.1)

## 2021-04-13 LAB — CBC WITH DIFFERENTIAL/PLATELET
Abs Immature Granulocytes: 0.03 10*3/uL (ref 0.00–0.07)
Basophils Absolute: 0 10*3/uL (ref 0.0–0.1)
Basophils Relative: 1 %
Eosinophils Absolute: 0.1 10*3/uL (ref 0.0–0.5)
Eosinophils Relative: 1 %
HCT: 37.2 % (ref 36.0–46.0)
Hemoglobin: 13.2 g/dL (ref 12.0–15.0)
Immature Granulocytes: 1 %
Lymphocytes Relative: 14 %
Lymphs Abs: 0.8 10*3/uL (ref 0.7–4.0)
MCH: 30.7 pg (ref 26.0–34.0)
MCHC: 35.5 g/dL (ref 30.0–36.0)
MCV: 86.5 fL (ref 80.0–100.0)
Monocytes Absolute: 0.7 10*3/uL (ref 0.1–1.0)
Monocytes Relative: 12 %
Neutro Abs: 4.4 10*3/uL (ref 1.7–7.7)
Neutrophils Relative %: 71 %
Platelets: 334 10*3/uL (ref 150–400)
RBC: 4.3 MIL/uL (ref 3.87–5.11)
RDW: 12.3 % (ref 11.5–15.5)
WBC: 6.1 10*3/uL (ref 4.0–10.5)
nRBC: 0 % (ref 0.0–0.2)

## 2021-04-13 NOTE — Progress Notes (Signed)
[place]   [phone] [phone]   Clinic Follow up Note   Patient Care Team: Rich Fuchs, PA as PCP - General (Physician Assistant) Kyung Rudd, MD as Consulting Physician (Radiation Oncology) Truitt Merle, MD as Consulting Physician (Hematology) Lorelle Gibbs, MD as Consulting Physician (Radiology) [name] Overall, MD as Consulting Physician (General Surgery) Delice Bison, Charlestine Massed, NP as Nurse Practitioner (Hematology and Oncology)  Date of Service:  04/13/2021  CHIEF COMPLAINT: f/u of left breast cancer  SUMMARY OF ONCOLOGIC HISTORY: Oncology History Overview Note  Cancer Staging Malignant neoplasm of upper-outer quadrant of left breast in female, estrogen receptor positive ([place]) Staging form: Breast, AJCC 8th Edition - Clinical: cT1b, cN0, cM0, G1, ER: Positive, PR: Positive - Unsigned - Pathologic stage from 03/15/2017: Stage IA (pT1b, pN0, cM0, G1, ER: Positive, PR: Positive, HER2: Negative) - Signed by Truitt Merle, MD on 03/19/2017      Malignant neoplasm of upper-outer quadrant of left breast in female, estrogen receptor positive ([place])  02/08/2017 Mammogram   Diagnostic mammogram Screening 02/08/17 IMPRESSION:  Addidiontal Imaging Evaluation needed The new oval mass in the left breast is indeterminate. 3D mediolateral, and spot copression views as well as an ultrasound are recommended    02/10/2017 Mammogram   Mammogram of the left breast 02/10/17 The 6 mm nodule in the left breast is indeterminate. An ultrasound is recommended.      02/10/2017 Imaging   [place] of Left Breast 02/10/17 The 6 mm lymph node with focal corytex thickening in the left breast is at a low suspicion for malignancy. An Ultrasound guided biopsy is recommended.     02/16/2017 Receptors her2   ER: 90% Positive PR: 80% Positive Ki67: 3% HER2: Negative    02/16/2017 Initial Biopsy   Diagnosis 02/16/17 Breast, left, needle core biopsy - INVASIVE  DUCTAL CARCINOMA - SEE COMMENT     03/11/2017 Initial Diagnosis   Malignant neoplasm of upper-outer quadrant of left breast in female, estrogen receptor positive ([place])    03/15/2017 Surgery   LEFT BREAST LUMPECTOMY WITH RADIOACTIVE SEED AND LEFT AXILLARY SENTINEL LYMPH NODE BIOPSY by Dr Lucia Gaskins     03/15/2017 Pathology Results   Diagnosis 03/15/17 1. 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 - RESECTION MARGINS ARE NEGATIVE. - BIOPSY SITE. - SCLEROSING ADENOSIS, FIBROCYSTIC CHANGE. - SEE ONCOLOGY TABLE. 2. Lymph node, sentinel, biopsy, Left axillary - ONE OF ONE LYMPH NODES NEGATIVE FOR CARCINOMA (0/1). 3. Lymph node, sentinel, biopsy, Left - ONE OF ONE LYMPH NODES NEGATIVE FOR CARCINOMA (0/1). 4. Lymph node, sentinel, biopsy, Left - ONE OF ONE LYMPH NODES NEGATIVE FOR CARCINOMA (0/1). 5. Lymph node, sentinel, biopsy, Left - ONE OF ONE LYMPH NODES NEGATIVE FOR CARCINOMA (0/1). 6. Lymph node, sentinel, biopsy, Left - ONE OF ONE LYMPH NODES NEGATIVE FOR CARCINOMA (0/1).     04/20/2017 Genetic Testing   Patient had genetic testing due to a family history of breast cancer and Ashkenazi Jewish ancestry.  She was tested for the [facility] Common Hereditary Cancers Panel.  The Hereditary Gene Panel offered by [facility] includes sequencing and/or deletion duplication testing of the following 46 genes: APC, ATM, AXIN2, BARD1, BMPR1A, BRCA1, BRCA2, BRIP1, CDH1, CDKN2A (p14ARF), CDKN2A (p16INK4a), CHEK2, CTNNA1, DICER1, EPCAM (Deletion/duplication testing only), GREM1 (promoter region deletion/duplication testing only), KIT, MEN1, MLH1, MSH2, MSH3, MSH6, MUTYH, NBN, NF1, NHTL1, PALB2, PDGFRA, PMS2, POLD1, POLE, PTEN, RAD50, RAD51C, RAD51D, SDHB, SDHC, SDHD, SMAD4,  SMARCA4. STK11, TP53, TSC1, TSC2, and VHL.  The following genes were evaluated for sequence changes only: SDHA and HOXB13 c.251G>A variant only.  Results: No  pathogenic mutations identified in the 46 genes analyzed.   The date of this test report is 04/20/2017.     04/25/2017 - 05/20/2017 Radiation Therapy   Adjuvant breast radiation, Dr. Lisbeth Renshaw   Radiation treatment dates:   04/25/2017 - 05/20/2017   Site/dose:  Left breast / 42.5 Gy in 17 fractions & boost / 7.5 Gy in 3 fractions    Beams/energy:   Photons/ 15X, 6X     Narrative: The patient tolerated radiation treatment relatively well.        05/2017 -  Anti-estrogen oral therapy   Tamoxifen 65m daily started 05/2017. Due to drug interaction with Tegretol will switch to anastrozole 151min 09/2019.       CURRENT THERAPY:  Tamoxifen 2037maily started 05/2017. Due to drug interaction with Tegretol, switched to anastrozole 1mg73m 09/2019.   INTERVAL HISTORY:  Deborah Cooke for a follow up of breast cancer. She was last seen by NP Lacie on 10/14/20. She presents to the clinic alone. She denies any side effects from anastrozole. She has arthritis at baseline. She reports she broke her left femur the afternoon after her last visit. She notes she was running errands and tripped over the curb. She reports she knocked her knee replacement out of line and snapped her femur. She is now using a walker (previously used a cane). She notes she has received a right shoulder steroid injection about 6 weeks ago. She notes this relieved her pain until this past week, but she cannot receive another injection for another 6 weeks. She notes her prior wrist fracture is well healed.   All other systems were reviewed with the patient and are negative.  MEDICAL HISTORY:  Past Medical History:  Diagnosis Date   Adverse effect of anesthetic 01/2012   difficulty waking up.   Adverse effect of unspecified anesthetic, initial encounter 08/2011   low o2 sats.   Anemia    Anxiety    Arthritis    Breast cancer (HCC)[place]/30/2018   left breast   Chronic pain    Closed supracondylar fracture of right femur (HCC)[place]2/30/2013   Depression    Family history of breast cancer    GERD (gastroesophageal reflux disease)    Headache(784.0)    Hypothyroidism    Opioid dependence in controlled environment (HCC)[place] Osteoporosis    Osteoporosis with fracture 09/19/2012   Thyroid disease     SURGICAL HISTORY: Past Surgical History:  Procedure Laterality Date    OPEN REDUCTION INTERNAL FIXATION (ORIF) DISTAL FEMUR FRACTURE (Left Leg Upper)  10/15/2020   BREAST BIOPSY Left 02/16/2017   BREAST LUMPECTOMY WITH RADIOACTIVE SEED AND SENTINEL LYMPH NODE BIOPSY Left 03/15/2017   Procedure: LEFT BREAST LUMPECTOMY WITH RADIOACTIVE SEED AND LEFT AXILLARY SENTINEL LYMPH NODE BIOPSY;  Surgeon: NewmAlphonsa Overall;  [facility];  Laterality: Left;   CHOLECYSTECTOMY  10/1975   FEMUR IM NAIL  09/18/2012   Procedure: INTRAMEDULLARY (IM) RETROGRADE FEMORAL NAILING;  Surgeon: JoshJohnny Bridge;  [facility];  Laterality: Right;   GASTRIC BYPASS  2003   JOINT REPLACEMENT     ORIF FEMUR FRACTURE Left 10/15/2020   Procedure: OPEN REDUCTION INTERNAL FIXATION (ORIF) DISTAL FEMUR FRACTURE;  Surgeon: HaddShona Needles;  Location:  [facility];  Service: Orthopedics;  Laterality: Left;   REPLACEMENT TOTAL KNEE  2003   right and left -4 months aoart.   TONSILLECTOMY     WRIST FRACTURE SURGERY  08/2011    I have reviewed the social history and family history with the patient and they are unchanged from previous note.  ALLERGIES:  is allergic to nsaids, ibuprofen, and tolmetin.  MEDICATIONS:  Current Outpatient Medications  Medication Sig Dispense Refill   acetaminophen (TYLENOL) 325 MG tablet Take 1-2 tablets (325-650 mg total) by mouth every 6 (six) hours as needed for mild pain, fever or headache.     AIMOVIG 140 MG/ML SOAJ Inject 140 mg as directed every 30 (thirty) days.     anastrozole (ARIMIDEX) 1 MG tablet TAKE 1 TABLET(1 MG) BY MOUTH DAILY 90 tablet 1    atenolol (TENORMIN) 25 MG tablet Take 25 mg by mouth daily. For migraine     B Complex Vitamins (B COMPLEX 100 PO) Take 1 tablet by mouth daily.     Biotin 5000 MCG CAPS Take 10,000 mcg by mouth daily.     Calcium Carbonate (CALCIUM 600 PO) Take 1 tablet by mouth in the morning and at bedtime.     carbamazepine (CARBATROL) 200 MG 12 hr capsule Take 400 mg by mouth 2 (two) times daily.     cholecalciferol (VITAMIN D) 400 units TABS tablet Take 400 Units by mouth daily.     cyclobenzaprine (FLEXERIL) 10 MG tablet Take 1 tablet (10 mg total) by mouth 3 (three) times daily as needed for muscle spasms. 21 tablet 0   enoxaparin (LOVENOX) 40 MG/0.4ML injection Inject 0.4 mLs (40 mg total) into the skin daily for 28 days. 11.2 mL 0   feeding supplement (ENSURE ENLIVE / ENSURE PLUS) LIQD Take 237 mLs by mouth 2 (two) times daily between meals.     ferrous sulfate 325 (65 FE) MG EC tablet Take 325 mg by mouth daily with breakfast.     gabapentin (NEURONTIN) 400 MG capsule Take 2 capsules (800 mg total) by mouth 4 (four) times daily. 20 capsule 0   hydrOXYzine (ATARAX/VISTARIL) 50 MG tablet Take 50 mg by mouth every 8 (eight) hours as needed for anxiety.  0   latanoprost (XALATAN) 0.005 % ophthalmic solution Place 1 drop into both eyes at bedtime. At night     levothyroxine (SYNTHROID) 88 MCG tablet Take 88 mcg by mouth daily.     liothyronine (CYTOMEL) 25 MCG tablet Take 25 mcg by mouth daily.     Multiple Vitamins-Iron (MULTIVITAMIN/IRON PO) Take 1 tablet by mouth daily.      omeprazole (PRILOSEC) 40 MG capsule Take 40 mg by mouth daily.     oxyCODONE-acetaminophen (PERCOCET) 10-325 MG tablet Take 1 tablet by mouth in the morning, at noon, in the evening, and at bedtime. 20 tablet 0   polyethylene glycol (MIRALAX / GLYCOLAX) 17 g packet Take 17 g by mouth 2 (two) times daily. 14 each 0   potassium chloride (K-DUR,KLOR-CON) 10 MEQ tablet Take 10 mEq by mouth daily. (Patient not taking: Reported on  10/15/2020)     PROAIR HFA 108 (90 Base) MCG/ACT inhaler Inhale 2 puffs into the lungs every 4 (four) hours as needed for shortness of breath.     QUEtiapine (SEROQUEL XR) 400 MG 24 hr tablet Take 400 mg by mouth at bedtime.     senna (SENOKOT) 8.6 MG TABS tablet Take 2 tablets (17.2 mg total) by mouth daily.  vortioxetine HBr (TRINTELLIX) 20 MG TABS tablet Take 20 mg by mouth at bedtime.     No current facility-administered medications for this visit.    PHYSICAL EXAMINATION: ECOG PERFORMANCE STATUS: 1 - Symptomatic but completely ambulatory  Vitals:   04/13/21 1115  BP: (!) 167/99  Pulse: 86  Resp: 18  Temp: 98.4 F (36.9 C)  SpO2: 97%   Filed Weights    GENERAL:alert, no distress and comfortable SKIN: skin color, texture, turgor are normal, no rashes or significant lesions EYES: normal, Conjunctiva are pink and non-injected, sclera clear  NECK: supple, thyroid normal size, non-tender, without nodularity LYMPH:  no palpable lymphadenopathy in the cervical, axillary  LUNGS: clear to auscultation and percussion with normal breathing effort HEART: regular rate & rhythm and no murmurs and no lower extremity edema ABDOMEN:abdomen soft, non-tender and normal bowel sounds Musculoskeletal:no cyanosis of digits and no clubbing  NEURO: alert & oriented x 3 with fluent speech, no focal motor/sensory deficits BREAST: No palpable mass, nodules or adenopathy bilaterally. Breast exam benign. Mild lymphedema present in left axilla.  LABORATORY DATA:  I have reviewed the data as listed CBC Latest Ref Rng & Units 04/13/2021 10/19/2020 10/17/2020  WBC 4.0 - 10.5 K/uL 6.1 5.9 6.5  Hemoglobin 12.0 - 15.0 g/dL 13.2 8.8(L) 8.5(L)  Hematocrit 36.0 - 46.0 % 37.2 26.7(L) 26.4(L)  Platelets 150 - 400 K/uL 334 249 188     CMP Latest Ref Rng & Units 04/13/2021 10/19/2020 10/17/2020  Glucose 70 - 99 mg/dL 96 134(H) 114(H)  BUN 8 - 23 mg/dL '13 14 17  ' Creatinine 0.44 - 1.00 mg/dL 0.70 0.59 0.75   Sodium 135 - 145 mmol/L 123(L) 137 134(L)  Potassium 3.5 - 5.1 mmol/L 4.6 4.0 3.7  Chloride 98 - 111 mmol/L 87(L) 102 98  CO2 22 - 32 mmol/L '26 25 25  ' Calcium 8.9 - 10.3 mg/dL 9.5 8.7(L) 8.3(L)  Total Protein 6.5 - 8.1 g/dL 7.2 - -  Total Bilirubin 0.3 - 1.2 mg/dL 0.4 - -  Alkaline Phos 38 - 126 U/L 144(H) - -  AST 15 - 41 U/L 24 - -  ALT 0 - 44 U/L 19 - -    RADIOGRAPHIC STUDIES: I have personally reviewed the radiological images as listed and agreed with the findings in the report. No results found.   ASSESSMENT & PLAN:  Deborah Cooke is a 70 y.o. female with   1. Malignant neoplasm of upper-outer quadrant of left breast in female, estrogen receptor positive, pT1bN0M0, Stage 1A, ER/PR Positive, Her2 negative, Grade 1  -Diagnosed in 01/2017. S/p left breast lumpectomy and SLN biopsy and adjuvant radiation  -She started adjuvant Tamoxifen in 05/2017. Due to drug interaction with carbamazepine (Tegretol) she was switched to Anastrozole in 09/2019. She had high copay for Exemestane so she could not take it.  -Mammograms annually in December ([facility]), reportedly negative 08/2020 -Plan to continue anastrozole through 05/2022.   2. H/o fractures, right wrist in 02/2019 and left femur in 09/2020 -wrist well healed -she continues to struggle with healing from her femur fracture. -fall precaution discussed   3. Hyponatremia -Her sodium is 123 today (04/13/21), she has chronic mild hyponatremia.. This could be secondary to her migraine and psych medicine. -I recommended she bring this up to her other doctors. I will reach out to her PCP to keep her updated.   4. Osteopenia  -DEXA scan in 04/2018 revealed T score of -1.7 at left femur. She reports she now has osteoporosis.  We do not have her most recent DEXA result. -She is on calcium and Vit D. She notes she receives Reclast at the osteoporosis clinic in [place].   5. Depression and Anxiety -possibly and/or bipolar disorder. -Management  per psychiatry with [facility], most recently seen 04/08/21   6. 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 She follows up every 6 months.  -On chronic pain management contract through [facility]  -She has been on Tegretol for over 1 year. Due to drug interaction with Tamoxifen she was switched to anastrozole in 09/2019  -chronic pains are stable   7. Thyroid dysfunction -on levothyroxine    8. Obesity, s/p bariatric surgery -s/p Roux-en-bypass gastric surgery  -Steady weight gain since 2019 but gaining recently -Recent weight gain may be contributing to left hip pain   PLAN: -I will reach out to PCP Scherrie Gerlach, PA to inform her of hyponatremia. -continue anastrozole -labs and f/u with NP Lacie in 6 months    No problem-specific Assessment & Plan notes found for this encounter.   No orders of the defined types were placed in this encounter.  All questions were answered. The patient knows to call the clinic with any problems, questions or concerns. No barriers to learning was detected. The total time spent in the appointment was 30 minutes.     Truitt Merle, MD 04/13/2021   I, Wilburn Mylar, am acting as scribe for Truitt Merle, MD.   I have reviewed the above documentation for accuracy and completeness, and I agree with the above.

## 2021-04-21 NOTE — Progress Notes (Signed)
This nurse faxed notes from office visit and lab reports to Scherrie Gerlach, [place].  Per Dr. Burr Medico request for call related to patient care.

## 2021-05-06 ENCOUNTER — Encounter (HOSPITAL_BASED_OUTPATIENT_CLINIC_OR_DEPARTMENT_OTHER): Payer: Self-pay | Admitting: Emergency Medicine

## 2021-05-06 ENCOUNTER — Emergency Department (HOSPITAL_BASED_OUTPATIENT_CLINIC_OR_DEPARTMENT_OTHER)
Admission: EM | Admit: 2021-05-06 | Discharge: 2021-05-06 | Disposition: A | Discharge status: Home / Self Care | Payer: Medicare Other | Attending: Emergency Medicine | Admitting: Emergency Medicine

## 2021-05-06 ENCOUNTER — Other Ambulatory Visit: Payer: Self-pay

## 2021-05-06 DIAGNOSIS — E039 Hypothyroidism, unspecified: Secondary | ICD-10-CM | POA: Insufficient documentation

## 2021-05-06 DIAGNOSIS — E871 Hypo-osmolality and hyponatremia: Secondary | ICD-10-CM | POA: Insufficient documentation

## 2021-05-06 DIAGNOSIS — Z79899 Other long term (current) drug therapy: Secondary | ICD-10-CM | POA: Insufficient documentation

## 2021-05-06 DIAGNOSIS — I10 Essential (primary) hypertension: Secondary | ICD-10-CM | POA: Insufficient documentation

## 2021-05-06 DIAGNOSIS — Z96653 Presence of artificial knee joint, bilateral: Secondary | ICD-10-CM | POA: Diagnosis not present

## 2021-05-06 DIAGNOSIS — Z87891 Personal history of nicotine dependence: Secondary | ICD-10-CM | POA: Diagnosis not present

## 2021-05-06 DIAGNOSIS — R799 Abnormal finding of blood chemistry, unspecified: Secondary | ICD-10-CM | POA: Diagnosis present

## 2021-05-06 DIAGNOSIS — Z853 Personal history of malignant neoplasm of breast: Secondary | ICD-10-CM | POA: Insufficient documentation

## 2021-05-06 LAB — BASIC METABOLIC PANEL
Anion gap: 8 (ref 5–15)
BUN: 18 mg/dL (ref 8–23)
CO2: 27 mmol/L (ref 22–32)
Calcium: 8.8 mg/dL — ABNORMAL LOW (ref 8.9–10.3)
Chloride: 90 mmol/L — ABNORMAL LOW (ref 98–111)
Creatinine, Ser: 0.47 mg/dL (ref 0.44–1.00)
GFR, Estimated: >60 mL/min (ref >60)
Glucose, Bld: 98 mg/dL (ref 70–99)
Potassium: 4.4 mmol/L (ref 3.5–5.1)
Sodium: 125 mmol/L — ABNORMAL LOW (ref 135–145)

## 2021-05-06 LAB — CBC WITH DIFFERENTIAL/PLATELET
Abs Immature Granulocytes: 0.02 10*3/uL (ref 0.00–0.07)
Basophils Absolute: 0 10*3/uL (ref 0.0–0.1)
Basophils Relative: 1 %
Eosinophils Absolute: 0.1 10*3/uL (ref 0.0–0.5)
Eosinophils Relative: 2 %
HCT: 35.4 % — ABNORMAL LOW (ref 36.0–46.0)
Hemoglobin: 12.7 g/dL (ref 12.0–15.0)
Immature Granulocytes: 1 %
Lymphocytes Relative: 24 %
Lymphs Abs: 0.9 10*3/uL (ref 0.7–4.0)
MCH: 31.3 pg (ref 26.0–34.0)
MCHC: 35.9 g/dL (ref 30.0–36.0)
MCV: 87.2 fL (ref 80.0–100.0)
Monocytes Absolute: 0.6 10*3/uL (ref 0.1–1.0)
Monocytes Relative: 16 %
Neutro Abs: 2.2 10*3/uL (ref 1.7–7.7)
Neutrophils Relative %: 56 %
Platelets: 284 10*3/uL (ref 150–400)
RBC: 4.06 MIL/uL (ref 3.87–5.11)
RDW: 12.2 % (ref 11.5–15.5)
WBC: 3.9 10*3/uL — ABNORMAL LOW (ref 4.0–10.5)
nRBC: 0 % (ref 0.0–0.2)

## 2021-05-06 NOTE — Discharge Instructions (Addendum)
Please follow up with your PCP regarding ED visit today and for further workup of your hyponatremia as some of your medications could be contributing. I would also recommend increasing the amount of water you drink daily.   Return to the ED IMMEDIATELY for any new/worsening symptoms including headache, seizures, fatigue, nausea, dizziness, gait disturbances, forgetfulness, confusion, lethargy, and muscle cramps.

## 2021-05-06 NOTE — ED Triage Notes (Signed)
Pt reports went to PCP yesterday and reports was called early this morning and told had low sodium levels, 122 per pt,and to come to ED for treatment. Pt denies any new pain, n/v,chest pain, weakness, dizziness.

## 2021-05-06 NOTE — ED Provider Notes (Signed)
[facility] Provider Note   CSN: [id number] Arrival date & time: 05/06/21  1037     History Chief Complaint  Patient presents with   Abnormal Lab    Deborah Cooke is a 70 y.o. female with PMHx HTN, Hypothyroidism, Anemia, GERD who presents to the ED today for abnormal lab.  She went to her PCP yesterday for routine lab work.  She was called at 8 AM this morning and told to come to the ED for critical lab value of a sodium of 120.  Patient states she otherwise feels fine.  She has no complaints of fatigue, generalized weakness, confusion, vision changes, hallucinations, vomiting, diarrhea.  She states that she drinks 1 glass of wine maybe once a month.  She states that she drinks about 3 water bottles of water per day.  No physical complaints at this time.   The history is provided by the patient and medical records.      Past Medical History:  Diagnosis Date   Adverse effect of anesthetic 01/2012   difficulty waking up.   Adverse effect of unspecified anesthetic, initial encounter 08/2011   low o2 sats.   Anemia    Anxiety    Arthritis    Breast cancer ([place]) 02/16/2017   left breast   Chronic pain    Closed supracondylar fracture of right femur ([place]) 09/18/2012   Depression    Family history of breast cancer    GERD (gastroesophageal reflux disease)    Headache(784.0)    Hypothyroidism    Opioid dependence in controlled environment ([place])    Osteoporosis    Osteoporosis with fracture 09/19/2012   Thyroid disease     Patient Active Problem List   Diagnosis Date Noted   Type I or II open comminuted intra-articular fracture of distal end of left femur, initial encounter ([place]) 10/15/2020   Closed fracture of left distal femur ([place]) 10/14/2020   Genetic testing 04/21/2017   Family history of breast cancer    Malignant neoplasm of upper-outer quadrant of left breast in female, estrogen receptor positive ([place]) 03/11/2017   Hypertension     Thyroid disease    Osteoporosis    Anxiety    Acute blood loss anemia 09/19/2012   Chronic pain 09/19/2012   Osteoporosis with fracture 09/19/2012   Closed supracondylar fracture of right femur ([place]) 09/18/2012    Past Surgical History:  Procedure Laterality Date    OPEN REDUCTION INTERNAL FIXATION (ORIF) DISTAL FEMUR FRACTURE (Left Leg Upper)  10/15/2020   BREAST BIOPSY Left 02/16/2017   BREAST LUMPECTOMY WITH RADIOACTIVE SEED AND SENTINEL LYMPH NODE BIOPSY Left 03/15/2017   Procedure: LEFT BREAST LUMPECTOMY WITH RADIOACTIVE SEED AND LEFT AXILLARY SENTINEL LYMPH NODE BIOPSY;  Surgeon: [name] Overall, MD;  Location: [facility];  Service: General;  Laterality: Left;   CHOLECYSTECTOMY  10/1975   FEMUR IM NAIL  09/18/2012   Procedure: INTRAMEDULLARY (IM) RETROGRADE FEMORAL NAILING;  Surgeon: [name], MD;  Location: [facility];  Service: Orthopedics;  Laterality: Right;   GASTRIC BYPASS  2003   JOINT REPLACEMENT     ORIF FEMUR FRACTURE Left 10/15/2020   Procedure: OPEN REDUCTION INTERNAL FIXATION (ORIF) DISTAL FEMUR FRACTURE;  Surgeon: Shona Needles, MD;  Location: [place];  Service: Orthopedics;  Laterality: Left;   REPLACEMENT TOTAL KNEE  2003   right and left -4 months aoart.   TONSILLECTOMY     WRIST FRACTURE SURGERY  08/2011  OB History   No obstetric history on file.     Family History  Problem Relation Age of Onset   Breast cancer Mother 71       died at 29   Cancer Father 84       uretherial cancer     Social History   Tobacco Use   Smoking status: Former    Packs/day: 1.00    Years: 28.00    Pack years: 28.00    Types: Cigarettes    Quit date: 01/17/1989    Years since quitting: 32.3   Smokeless tobacco: Former    Quit date: 01/17/1989  Vaping Use   Vaping Use: Never used  Substance Use Topics   Alcohol use: Yes    Comment: rarely   Drug use: No    Home Medications Prior to Admission medications   Medication Sig Start Date End Date  Taking? Authorizing Provider  acetaminophen (TYLENOL) 325 MG tablet Take 1-2 tablets (325-650 mg total) by mouth every 6 (six) hours as needed for mild pain, fever or headache. 10/21/20  Yes Patrecia Pour, MD  anastrozole (ARIMIDEX) 1 MG tablet TAKE 1 TABLET(1 MG) BY MOUTH DAILY 01/08/21  Yes Truitt Merle, MD  atenolol (TENORMIN) 25 MG tablet Take 25 mg by mouth daily. For migraine 08/10/20  Yes [provider]  B Complex Vitamins (B COMPLEX 100 PO) Take 1 tablet by mouth daily.   Yes [provider]  Biotin 5000 MCG CAPS Take 10,000 mcg by mouth daily.   Yes [provider]  Calcium Carbonate (CALCIUM 600 PO) Take 1 tablet by mouth in the morning and at bedtime.   Yes [provider]  cholecalciferol (VITAMIN D) 400 units TABS tablet Take 400 Units by mouth daily.   Yes [provider]  cyclobenzaprine (FLEXERIL) 10 MG tablet Take 1 tablet (10 mg total) by mouth 3 (three) times daily as needed for muscle spasms. 10/17/20  Yes Delray Alt, PA-C  ferrous sulfate 325 (65 FE) MG EC tablet Take 325 mg by mouth daily with breakfast.   Yes [provider]  gabapentin (NEURONTIN) 400 MG capsule Take 2 capsules (800 mg total) by mouth 4 (four) times daily. 10/17/20  Yes Delray Alt, PA-C  hydrOXYzine (ATARAX/VISTARIL) 50 MG tablet Take 50 mg by mouth every 8 (eight) hours as needed for anxiety. 02/18/17  Yes [provider]  latanoprost (XALATAN) 0.005 % ophthalmic solution Place 1 drop into both eyes at bedtime. At night 06/25/16  Yes [provider]  levothyroxine (SYNTHROID) 88 MCG tablet Take 88 mcg by mouth daily.   Yes [provider]  liothyronine (CYTOMEL) 25 MCG tablet Take 25 mcg by mouth daily.   Yes [provider]  Multiple Vitamins-Iron (MULTIVITAMIN/IRON PO) Take 1 tablet by mouth daily.    Yes [provider]  omeprazole (PRILOSEC) 40 MG capsule Take 40 mg by mouth daily.   Yes [provider]  polyethylene glycol (MIRALAX / GLYCOLAX) 17 g packet Take 17 g by mouth 2 (two) times daily. 10/21/20  Yes Patrecia Pour, MD  QUEtiapine (SEROQUEL XR) 400 MG 24 hr tablet Take 400 mg by mouth at bedtime.   Yes [provider]  vortioxetine HBr (TRINTELLIX) 20 MG TABS tablet Take 20 mg by mouth at bedtime.   Yes [provider]  AIMOVIG 140 MG/ML SOAJ Inject 140 mg as directed every 30 (thirty) days. 08/07/18   [provider]  carbamazepine (CARBATROL)  200 MG 12 hr capsule Take 400 mg by mouth 2 (two) times daily. 07/26/18 10/15/20  [provider]  enoxaparin (LOVENOX) 40 MG/0.4ML injection Inject 0.4 mLs (40 mg total) into the skin daily for 28 days. 10/17/20 11/14/20  Delray Alt, PA-C  feeding supplement (ENSURE ENLIVE / ENSURE PLUS) LIQD Take 237 mLs by mouth 2 (two) times daily between meals. 10/21/20   Patrecia Pour, MD  oxyCODONE-acetaminophen (PERCOCET) 10-325 MG tablet Take 1 tablet by mouth in the morning, at noon, in the evening, and at bedtime. 10/17/20   Delray Alt, PA-C  potassium chloride (K-DUR,KLOR-CON) 10 MEQ tablet Take 10 mEq by mouth daily. Patient not taking: No sig reported    [provider]  PROAIR HFA 108 (90 Base) MCG/ACT inhaler Inhale 2 puffs into the lungs every 4 (four) hours as needed for shortness of breath. 07/24/20   [provider]  senna (SENOKOT) 8.6 MG TABS tablet Take 2 tablets (17.2 mg total) by mouth daily. 10/21/20   Patrecia Pour, MD    Allergies    Nsaids, Ibuprofen, and Tolmetin  Review of Systems   Review of Systems  Constitutional:  Negative for chills, fatigue and fever.  Eyes:  Negative for visual disturbance.  Gastrointestinal:  Negative for diarrhea, nausea and vomiting.  Neurological:  Negative for dizziness, weakness, light-headedness and headaches.  Psychiatric/Behavioral:  Negative for confusion and hallucinations.   All other systems reviewed and are  negative.  Physical Exam Updated Vital Signs BP (!) 165/107   Pulse 76   Temp 98.4 F (36.9 C) (Oral)   Resp 20   Ht 4' 11.25" (1.505 m)   Wt 100.8 kg   SpO2 100%   BMI 44.50 kg/m   Physical Exam Vitals and nursing note reviewed.  Constitutional:      Appearance: She is not ill-appearing.  HENT:     Head: Normocephalic and atraumatic.  Eyes:     Conjunctiva/sclera: Conjunctivae normal.  Cardiovascular:     Rate and Rhythm: Normal rate and regular rhythm.     Pulses: Normal pulses.  Pulmonary:     Effort: Pulmonary effort is normal.     Breath sounds: Normal breath sounds. No wheezing, rhonchi or rales.  Abdominal:     Palpations: Abdomen is soft.     Tenderness: There is no abdominal tenderness.  Musculoskeletal:     Cervical back: Neck supple.  Skin:    General: Skin is warm and dry.  Neurological:     Mental Status: She is alert.    ED Results / Procedures / Treatments   Labs (all labs ordered are listed, but only abnormal results are displayed) Labs Reviewed  CBC WITH DIFFERENTIAL/PLATELET - Abnormal; Notable for the following components:      Result Value   WBC 3.9 (*)    HCT 35.4 (*)    All other components within normal limits  BASIC METABOLIC PANEL - Abnormal; Notable for the following components:   Sodium 125 (*)    Chloride 90 (*)    Calcium 8.8 (*)    All other components within normal limits    EKG None  Radiology No results found.  Procedures Procedures   Medications Ordered in ED Medications - No data to display  ED Course  I have reviewed the triage vital signs and the nursing notes.  Pertinent labs & imaging results that were available during my care of the patient were reviewed by me and considered in my  medical decision making (see chart for details).    MDM Rules/Calculators/A&P                           70 year old female who presents to the ED today for abnormal lab value with a sodium of 120.  Had routine labs done  yesterday at PCP.  Otherwise has no complaints.  On arrival to the ED patient is afebrile, nontachycardic and nontachypneic.  Blood pressure 136/105.  She is overall well-appearing.  Denies heavy alcohol use and attempt to stay hydrated throughout the day with 3 water bottles of water.  We will plan for repeat labs at this time and further evaluation of possible hyponatremia.  If lab work unremarkable will discharge home with PCP follow-up.   CBC without leukocytosis and hgb stable at 12.7 BMP with sodium 125, chloride 90. No other electrolyte abnormalities  Per chart review pt saw oncology 3 weeks ago with sodium 123. Does appear she has been low in the past including 01/25 at 129 and 07/26 at 126. Given pt without symptoms and has had intermittent hyponatremia in the past do not feel she requires intervention at this time. Discussed case with attending physician Dr. Regenia Skeeter who agrees with plan. Pt advised to follow up with PCP for same as some of her medications could be contributing. She is on lasix however takes it very sporadically.   This note was prepared using Dragon voice recognition software and may include unintentional dictation errors due to the inherent limitations of voice recognition software.   Final Clinical Impression(s) / ED Diagnoses Final diagnoses:  Hyponatremia    Rx / DC Orders ED Discharge Orders     None        Discharge Instructions      Please follow up with your PCP regarding ED visit today and for further workup of your hyponatremia as some of your medications could be contributing. I would also recommend increasing the amount of water you drink daily.   Return to the ED IMMEDIATELY for any new/worsening symptoms including headache, seizures, fatigue, nausea, dizziness, gait disturbances, forgetfulness, confusion, lethargy, and muscle cramps.        Eustaquio Maize, PA-C 05/06/21 1219    Sherwood Gambler, MD 05/09/21 [phone]

## 2021-05-06 NOTE — ED Notes (Signed)
ED Provider at bedside. 

## 2021-07-22 ENCOUNTER — Other Ambulatory Visit: Payer: Self-pay | Admitting: Hematology

## 2021-07-22 DIAGNOSIS — Z170 Estrogen receptor positive status [ER+]: Secondary | ICD-10-CM

## 2021-07-22 DIAGNOSIS — C50412 Malignant neoplasm of upper-outer quadrant of left female breast: Secondary | ICD-10-CM

## 2021-08-03 ENCOUNTER — Other Ambulatory Visit: Payer: Self-pay | Admitting: Orthopedic Surgery

## 2021-08-03 DIAGNOSIS — M19011 Primary osteoarthritis, right shoulder: Secondary | ICD-10-CM

## 2021-08-05 ENCOUNTER — Ambulatory Visit
Admission: RE | Admit: 2021-08-05 | Discharge: 2021-08-05 | Disposition: A | Discharge status: Home / Self Care | Payer: Medicare Other | Source: Ambulatory Visit | Attending: Orthopedic Surgery | Admitting: Orthopedic Surgery

## 2021-08-05 DIAGNOSIS — M19011 Primary osteoarthritis, right shoulder: Secondary | ICD-10-CM

## 2021-08-21 ENCOUNTER — Other Ambulatory Visit: Payer: Self-pay | Admitting: Hematology

## 2021-08-21 DIAGNOSIS — Z170 Estrogen receptor positive status [ER+]: Secondary | ICD-10-CM

## 2021-08-21 DIAGNOSIS — C50412 Malignant neoplasm of upper-outer quadrant of left female breast: Secondary | ICD-10-CM

## 2021-09-22 DIAGNOSIS — M12811 Other specific arthropathies, not elsewhere classified, right shoulder: Secondary | ICD-10-CM | POA: Diagnosis present

## 2021-10-05 ENCOUNTER — Encounter (HOSPITAL_COMMUNITY): Payer: Self-pay

## 2021-10-05 NOTE — Patient Instructions (Signed)
DUE TO COVID-19 ONLY ONE VISITOR IS ALLOWED TO COME WITH YOU AND STAY IN THE WAITING ROOM ONLY DURING PRE OP AND PROCEDURE DAY OF SURGERY.             Your procedure is scheduled on: 10-20-21   Report to [facility] Main  Entrance   Report to admitting at      [place] AM     Call this number if you have problems the morning of surgery [phone]   Remember: NO SOLID FOOD AFTER MIDNIGHT THE NIGHT PRIOR TO SURGERY. NOTHING BY MOUTH EXCEPT CLEAR LIQUIDS UNTIL 0730 am .  PLEASE FINISH ENSURE DRINK PER SURGEON ORDER  WHICH NEEDS TO BE COMPLETED AT       0730 am then nothing by mouth.     CLEAR LIQUID DIET                                                                    water Black Coffee and tea, regular and decaf No Creamer                            Plain Jell-O any favor except red or purple                                  Fruit ices (not with fruit pulp)                                      Iced Popsicles                                     Carbonated beverages, regular and diet                                    Cranberry, grape and apple juices Sports drinks like Gatorade Lightly seasoned clear broth or consume(fat free) Sugar, honey syrup  _____________________________________________________________________     BRUSH YOUR TEETH MORNING OF SURGERY AND RINSE YOUR MOUTH OUT, NO CHEWING GUM CANDY OR MINTS.     Take these medicines the morning of surgery with A SIP OF WATER: inhaler and bring with you , arimidex, percocet, omeprazole, liothyronine, levothyroxine, hydroxyzine, gabapentin, duloxetine, carbamazepine, bupropion, atenelol                                 You may not have any metal on your body including hair pins and              piercings  Do not wear jewelry, make-up, lotions, powders,perfumes,        deodorant             Do not wear nail polish on your fingernails or toenails .  Do not shave  48 hours prior to surgery.            Marland Kitchen  Do not bring  valuables to the hospital. [place].  Contacts, dentures or bridgework may not be worn into surgery.       Patients discharged the day of surgery will not be allowed to drive home. IF YOU ARE HAVING SURGERY AND GOING HOME THE SAME DAY, YOU MUST HAVE AN ADULT TO DRIVE YOU HOME AND BE WITH YOU FOR 24 HOURS. YOU MAY GO HOME BY TAXI OR UBER OR ORTHERWISE, BUT AN ADULT MUST ACCOMPANY YOU HOME AND STAY WITH YOU FOR 24 HOURS.  Name and phone number of your driver:  Special Instructions: N/A              Please read over the following fact sheets you were given: _____________________________________________________________________ [facility]- Preparing for Total Shoulder Arthroplasty    Before surgery, you can play an important role. Because skin is not sterile, your skin needs to be as free of germs as possible. You can reduce the number of germs on your skin by using the following products. Benzoyl Peroxide Gel Reduces the number of germs present on the skin Applied twice a day to shoulder area starting two days before surgery    ==================================================================  Please follow these instructions carefully:  BENZOYL PEROXIDE 5% GEL  Please do not use if you have an allergy to benzoyl peroxide.   If your skin becomes reddened/irritated stop using the benzoyl peroxide.  Starting two days before surgery, apply as follows: Apply benzoyl peroxide in the morning and at night. Apply after taking a shower. If you are not taking a shower clean entire shoulder front, back, and side along with the armpit with a clean wet washcloth.  Place a quarter-sized dollop on your shoulder and rub in thoroughly, making sure to cover the front, back, and side of your shoulder, along with the armpit.   2 days before ____ AM   ____ PM              1 day before ____ AM   ____ PM                         Do this twice a day for two  days.  (Last application is the night before surgery, AFTER using the CHG soap as described below).  Do NOT apply benzoyl peroxide gel on the day of surgery.             [facility] - Preparing for Surgery Before surgery, you can play an important role.  Because skin is not sterile, your skin needs to be as free of germs as possible.  You can reduce the number of germs on your skin by washing with CHG (chlorahexidine gluconate) soap before surgery.  CHG is an antiseptic cleaner which kills germs and bonds with the skin to continue killing germs even after washing. Please DO NOT use if you have an allergy to CHG or antibacterial soaps.  If your skin becomes reddened/irritated stop using the CHG and inform your nurse when you arrive at Short Stay. Do not shave (including legs and underarms) for at least 48 hours prior to the first CHG shower.  You may shave your face/neck. Please follow these instructions carefully:  1.  Shower with CHG Soap the night before surgery and the  morning of Surgery.  2.  If you choose to wash your  hair, wash your hair first as usual with your  normal  shampoo.  3.  After you shampoo, rinse your hair and body thoroughly to remove the  shampoo.                           4.  Use CHG as you would any other liquid soap.  You can apply chg directly  to the skin and wash                       Gently with a scrungie or clean washcloth.  5.  Apply the CHG Soap to your body ONLY FROM THE NECK DOWN.   Do not use on face/ open                           Wound or open sores. Avoid contact with eyes, ears mouth and genitals (private parts).                       Wash face,  Genitals (private parts) with your normal soap.             6.  Wash thoroughly, paying special attention to the area where your surgery  will be performed.  7.  Thoroughly rinse your body with warm water from the neck down.  8.  DO NOT shower/wash with your normal soap after using and rinsing off  the CHG Soap.                 9.  Pat yourself dry with a clean towel.            10.  Wear clean pajamas.            11.  Place clean sheets on your bed the night of your first shower and do not  sleep with pets. Day of Surgery : Do not apply any lotions/deodorants the morning of surgery.  Please wear clean clothes to the hospital/surgery center.  FAILURE TO FOLLOW THESE INSTRUCTIONS MAY RESULT IN THE CANCELLATION OF YOUR SURGERY PATIENT SIGNATURE_________________________________  NURSE SIGNATURE__________________________________  ________________________________________________________________________

## 2021-10-05 NOTE — Progress Notes (Addendum)
PCP - Scherrie Gerlach PA clearance 09-08-21 epic Cardiologist - no  PPM/ICD -  Device Orders -  Rep Notified -   Chest x-ray -  EKG - 08-25-21  requested Stress Test -  ECHO -  Cardiac Cath -   Sleep Study -  CPAP -   Fasting Blood Sugar -  Checks Blood Sugar _____ times a day  Blood Thinner Instructions: Aspirin Instructions:  ERAS Protcol - PRE-SURGERY Ensure or G2-   COVID TEST- N/A COVID vaccine -x4  Activity--Able to walk around home without sob Anesthesia review: HTN, NA 123  Patient denies shortness of breath, fever, cough and chest pain at PAT appointment   All instructions explained to the patient, with a verbal understanding of the material. Patient agrees to go over the instructions while at home for a better understanding. Patient also instructed to self quarantine after being tested for COVID-19. The opportunity to ask questions was provided.

## 2021-10-07 ENCOUNTER — Encounter (HOSPITAL_COMMUNITY): Payer: Self-pay

## 2021-10-07 ENCOUNTER — Other Ambulatory Visit: Payer: Self-pay

## 2021-10-07 ENCOUNTER — Encounter (HOSPITAL_COMMUNITY)
Admission: RE | Admit: 2021-10-07 | Discharge: 2021-10-07 | Disposition: A | Discharge status: Home / Self Care | Payer: Medicare Other | Source: Ambulatory Visit | Attending: Orthopedic Surgery | Admitting: Orthopedic Surgery

## 2021-10-07 VITALS — BP 181/98 | HR 79 | Temp 98.2°F | Resp 16 | Ht 59.0 in | Wt 237.0 lb

## 2021-10-07 DIAGNOSIS — Z01818 Encounter for other preprocedural examination: Secondary | ICD-10-CM

## 2021-10-07 DIAGNOSIS — Z01812 Encounter for preprocedural laboratory examination: Secondary | ICD-10-CM | POA: Insufficient documentation

## 2021-10-07 DIAGNOSIS — I10 Essential (primary) hypertension: Secondary | ICD-10-CM | POA: Insufficient documentation

## 2021-10-07 HISTORY — DX: Pneumonia, unspecified organism: J18.9

## 2021-10-07 LAB — BASIC METABOLIC PANEL
Anion gap: 8 (ref 5–15)
BUN: 25 mg/dL — ABNORMAL HIGH (ref 8–23)
CO2: 28 mmol/L (ref 22–32)
Calcium: 9.2 mg/dL (ref 8.9–10.3)
Chloride: 87 mmol/L — ABNORMAL LOW (ref 98–111)
Creatinine, Ser: 0.56 mg/dL (ref 0.44–1.00)
GFR, Estimated: >60 mL/min (ref >60)
Glucose, Bld: 106 mg/dL — ABNORMAL HIGH (ref 70–99)
Potassium: 5.1 mmol/L (ref 3.5–5.1)
Sodium: 123 mmol/L — ABNORMAL LOW (ref 135–145)

## 2021-10-07 LAB — CBC
HCT: 35.7 % — ABNORMAL LOW (ref 36.0–46.0)
Hemoglobin: 12.2 g/dL (ref 12.0–15.0)
MCH: 31 pg (ref 26.0–34.0)
MCHC: 34.2 g/dL (ref 30.0–36.0)
MCV: 90.6 fL (ref 80.0–100.0)
Platelets: 279 10*3/uL (ref 150–400)
RBC: 3.94 MIL/uL (ref 3.87–5.11)
RDW: 12.3 % (ref 11.5–15.5)
WBC: 5.2 10*3/uL (ref 4.0–10.5)
nRBC: 0 % (ref 0.0–0.2)

## 2021-10-07 LAB — SURGICAL PCR SCREEN
MRSA, PCR: NEGATIVE
Staphylococcus aureus: POSITIVE — AB

## 2021-10-13 ENCOUNTER — Encounter: Payer: Self-pay | Admitting: Nurse Practitioner

## 2021-10-13 ENCOUNTER — Inpatient Hospital Stay (HOSPITAL_BASED_OUTPATIENT_CLINIC_OR_DEPARTMENT_OTHER): Payer: Medicare Other | Admitting: Nurse Practitioner

## 2021-10-13 ENCOUNTER — Other Ambulatory Visit: Payer: Self-pay

## 2021-10-13 ENCOUNTER — Inpatient Hospital Stay: Payer: Medicare Other | Attending: Nurse Practitioner

## 2021-10-13 VITALS — BP 158/92 | HR 77 | Temp 98.4°F | Resp 18 | Ht 59.0 in | Wt 241.3 lb

## 2021-10-13 DIAGNOSIS — Z170 Estrogen receptor positive status [ER+]: Secondary | ICD-10-CM | POA: Insufficient documentation

## 2021-10-13 DIAGNOSIS — I10 Essential (primary) hypertension: Secondary | ICD-10-CM | POA: Insufficient documentation

## 2021-10-13 DIAGNOSIS — F419 Anxiety disorder, unspecified: Secondary | ICD-10-CM | POA: Insufficient documentation

## 2021-10-13 DIAGNOSIS — Z923 Personal history of irradiation: Secondary | ICD-10-CM | POA: Diagnosis not present

## 2021-10-13 DIAGNOSIS — C50412 Malignant neoplasm of upper-outer quadrant of left female breast: Secondary | ICD-10-CM | POA: Diagnosis present

## 2021-10-13 DIAGNOSIS — Z79899 Other long term (current) drug therapy: Secondary | ICD-10-CM | POA: Diagnosis not present

## 2021-10-13 DIAGNOSIS — F32A Depression, unspecified: Secondary | ICD-10-CM | POA: Insufficient documentation

## 2021-10-13 DIAGNOSIS — Z7901 Long term (current) use of anticoagulants: Secondary | ICD-10-CM | POA: Diagnosis not present

## 2021-10-13 DIAGNOSIS — M8580 Other specified disorders of bone density and structure, unspecified site: Secondary | ICD-10-CM | POA: Insufficient documentation

## 2021-10-13 DIAGNOSIS — E039 Hypothyroidism, unspecified: Secondary | ICD-10-CM | POA: Insufficient documentation

## 2021-10-13 DIAGNOSIS — Z79811 Long term (current) use of aromatase inhibitors: Secondary | ICD-10-CM | POA: Insufficient documentation

## 2021-10-13 LAB — CBC WITH DIFFERENTIAL/PLATELET
Abs Immature Granulocytes: 0.03 10*3/uL (ref 0.00–0.07)
Basophils Absolute: 0 10*3/uL (ref 0.0–0.1)
Basophils Relative: 1 %
Eosinophils Absolute: 0.1 10*3/uL (ref 0.0–0.5)
Eosinophils Relative: 1 %
HCT: 34.9 % — ABNORMAL LOW (ref 36.0–46.0)
Hemoglobin: 12.2 g/dL (ref 12.0–15.0)
Immature Granulocytes: 1 %
Lymphocytes Relative: 16 %
Lymphs Abs: 1 10*3/uL (ref 0.7–4.0)
MCH: 31 pg (ref 26.0–34.0)
MCHC: 35 g/dL (ref 30.0–36.0)
MCV: 88.8 fL (ref 80.0–100.0)
Monocytes Absolute: 0.6 10*3/uL (ref 0.1–1.0)
Monocytes Relative: 10 %
Neutro Abs: 4.4 10*3/uL (ref 1.7–7.7)
Neutrophils Relative %: 71 %
Platelets: 296 10*3/uL (ref 150–400)
RBC: 3.93 MIL/uL (ref 3.87–5.11)
RDW: 12.4 % (ref 11.5–15.5)
WBC: 6.1 10*3/uL (ref 4.0–10.5)
nRBC: 0 % (ref 0.0–0.2)

## 2021-10-13 LAB — COMPREHENSIVE METABOLIC PANEL
ALT: 19 U/L (ref 0–44)
AST: 25 U/L (ref 15–41)
Albumin: 4.2 g/dL (ref 3.5–5.0)
Alkaline Phosphatase: 106 U/L (ref 38–126)
Anion gap: 7 (ref 5–15)
BUN: 25 mg/dL — ABNORMAL HIGH (ref 8–23)
CO2: 28 mmol/L (ref 22–32)
Calcium: 9.4 mg/dL (ref 8.9–10.3)
Chloride: 94 mmol/L — ABNORMAL LOW (ref 98–111)
Creatinine, Ser: 0.69 mg/dL (ref 0.44–1.00)
GFR, Estimated: >60 mL/min (ref >60)
Glucose, Bld: 99 mg/dL (ref 70–99)
Potassium: 5.2 mmol/L — ABNORMAL HIGH (ref 3.5–5.1)
Sodium: 129 mmol/L — ABNORMAL LOW (ref 135–145)
Total Bilirubin: 0.4 mg/dL (ref 0.3–1.2)
Total Protein: 7.1 g/dL (ref 6.5–8.1)

## 2021-10-13 NOTE — Progress Notes (Signed)
[place]   [phone] [phone]   Clinic Follow up Note   Patient Care Team: Deborah Fuchs, PA as PCP - General (Physician Assistant) Deborah Rudd, Cooke as Consulting Physician (Radiation Oncology) Deborah Cooke as Consulting Physician (Hematology) Deborah Gibbs, Cooke as Consulting Physician (Radiology) Deborah Overall, Cooke as Consulting Physician (General Surgery) Deborah Phlegm, NP as Nurse Practitioner (Hematology and Oncology) 10/13/2021  CHIEF COMPLAINT: Follow-up left breast cancer  SUMMARY OF ONCOLOGIC HISTORY: Oncology History Overview Note  Cancer Staging Malignant neoplasm of upper-outer quadrant of left breast in female, estrogen receptor positive ([place]) Staging form: Breast, AJCC 8th Edition - Clinical: cT1b, cN0, cM0, G1, ER: Positive, PR: Positive - Unsigned - Pathologic stage from 03/15/2017: Stage IA (pT1b, pN0, cM0, G1, ER: Positive, PR: Positive, HER2: Negative) - Signed by Deborah Cooke on 03/19/2017     Malignant neoplasm of upper-outer quadrant of left breast in female, estrogen receptor positive ([place])  02/08/2017 Mammogram   Diagnostic mammogram Screening 02/08/17 IMPRESSION:  Addidiontal Imaging Evaluation needed The new oval mass in the left breast is indeterminate. 3D mediolateral, and spot copression views as well as an ultrasound are recommended   02/10/2017 Mammogram   Mammogram of the left breast 02/10/17 The 6 mm nodule in the left breast is indeterminate. An ultrasound is recommended.     02/10/2017 Imaging   [place] of Left Breast 02/10/17 The 6 mm lymph node with focal corytex thickening in the left breast is at a low suspicion for malignancy. An Ultrasound guided biopsy is recommended.    02/16/2017 Receptors her2   ER: 90% Positive PR: 80% Positive Ki67: 3% HER2: Negative   02/16/2017 Initial Biopsy   Diagnosis 02/16/17 Breast, left, needle core biopsy - INVASIVE DUCTAL CARCINOMA - SEE  COMMENT    03/11/2017 Initial Diagnosis   Malignant neoplasm of upper-outer quadrant of left breast in female, estrogen receptor positive ([place])   03/15/2017 Surgery   LEFT BREAST LUMPECTOMY WITH RADIOACTIVE SEED AND LEFT AXILLARY SENTINEL LYMPH NODE BIOPSY by Deborah Cooke    03/15/2017 Pathology Results   Diagnosis 03/15/17 1. 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 - RESECTION MARGINS ARE NEGATIVE. - BIOPSY SITE. - SCLEROSING ADENOSIS, FIBROCYSTIC CHANGE. - SEE ONCOLOGY TABLE. 2. Lymph node, sentinel, biopsy, Left axillary - ONE OF ONE LYMPH NODES NEGATIVE FOR CARCINOMA (0/1). 3. Lymph node, sentinel, biopsy, Left - ONE OF ONE LYMPH NODES NEGATIVE FOR CARCINOMA (0/1). 4. Lymph node, sentinel, biopsy, Left - ONE OF ONE LYMPH NODES NEGATIVE FOR CARCINOMA (0/1). 5. Lymph node, sentinel, biopsy, Left - ONE OF ONE LYMPH NODES NEGATIVE FOR CARCINOMA (0/1). 6. Lymph node, sentinel, biopsy, Left - ONE OF ONE LYMPH NODES NEGATIVE FOR CARCINOMA (0/1).    04/20/2017 Genetic Testing   Patient had genetic testing due to a family history of breast cancer and Ashkenazi Jewish ancestry.  She was tested for the [facility] Common Hereditary Cancers Panel.  The Hereditary Gene Panel offered by [facility] includes sequencing and/or deletion duplication testing of the following 46 genes: APC, ATM, AXIN2, BARD1, BMPR1A, BRCA1, BRCA2, BRIP1, CDH1, CDKN2A (p14ARF), CDKN2A (p16INK4a), CHEK2, CTNNA1, DICER1, EPCAM (Deletion/duplication testing only), GREM1 (promoter region deletion/duplication testing only), KIT, MEN1, MLH1, MSH2, MSH3, MSH6, MUTYH, NBN, NF1, NHTL1, PALB2, PDGFRA, PMS2, POLD1, POLE, PTEN, RAD50, RAD51C, RAD51D, SDHB, SDHC, SDHD, SMAD4, SMARCA4. STK11, TP53, TSC1, TSC2, and VHL.  The following genes were evaluated for sequence  changes only: SDHA and HOXB13 c.251G>A variant only.  Results: No pathogenic mutations identified in the  46 genes analyzed.   The date of this test report is 04/20/2017.    04/25/2017 - 05/20/2017 Radiation Therapy   Adjuvant breast radiation, Deborah Cooke   Radiation treatment dates:   04/25/2017 - 05/20/2017   Site/dose:  Left breast / 42.5 Gy in 17 fractions & boost / 7.5 Gy in 3 fractions    Beams/energy:   Photons/ 15X, 6X     Narrative: The patient tolerated radiation treatment relatively well.       05/2017 -  Anti-estrogen oral therapy   Tamoxifen 52m daily started 05/2017. Due to drug interaction with Tegretol will switch to anastrozole 128min 09/2019.      CURRENT THERAPY: Tamoxifen 2028maily started 05/2017. Due to drug interaction with Tegretol, switched to anastrozole 1mg34m 09/2019.   INTERVAL HISTORY: Ms. GannAgarurns for follow-up as scheduled, last seen by Deborah Cooke Medico5/2022.  It took her 9 months to recover from femur fracture, now she is dealing with "horrific" pain in the right shoulder from severe arthritis.  She is having shoulder replacement next week.  She will be in a sling for 6 weeks then PT for 6 weeks.  Otherwise she is doing fine, mammogram earlier this month was normal per patient.  She continues anastrozole without any side effects.  DEXA 05/01/2018 at Deborah Cooke osteopenia of the left femur T score -1.7.  She is overdue.  Has not received Reclast in a couple years.  Denies concerns in her breast such as new lump/mass, nipple discharge or inversion, or skin change.  Denies hot flashes, fever, chills, cough, chest pain, dyspnea, worsening migraines, or any other new complaints.   MEDICAL HISTORY:  Past Medical History:  Diagnosis Date   Adverse effect of anesthetic 01/2012   difficulty waking up.   Adverse effect of unspecified anesthetic, initial encounter 08/2011   low o2 sats.   Anemia    Anxiety    Arthritis    Breast cancer (HCC)[place]/30/2018   left breast   Chronic pain    Closed supracondylar fracture of right femur (HCC)[place]/30/2013   Depression     Family history of breast cancer    GERD (gastroesophageal reflux disease)    pt denies   Headache(784.0)    Hypertension    Hypothyroidism    Opioid dependence in controlled environment (HCC)[place] Osteoporosis    Osteoporosis with fracture 09/19/2012   Pneumonia    Thyroid disease     SURGICAL HISTORY: Past Surgical History:  Procedure Laterality Date    OPEN REDUCTION INTERNAL FIXATION (ORIF) DISTAL FEMUR FRACTURE (Left Leg Upper)  10/15/2020   BACK SURGERY     fusion mid back, lumbar and thoracic   BREAST BIOPSY Left 02/16/2017   BREAST LUMPECTOMY WITH RADIOACTIVE SEED AND SENTINEL LYMPH NODE BIOPSY Left 03/15/2017   Procedure: LEFT BREAST LUMPECTOMY WITH RADIOACTIVE SEED AND LEFT AXILLARY SENTINEL LYMPH NODE BIOPSY;  Surgeon: NewmAlphonsa Cooke;  [facility];  Laterality: Left;   CHOLECYSTECTOMY  10/1975   FEMUR IM NAIL  09/18/2012   Procedure: INTRAMEDULLARY (IM) RETROGRADE FEMORAL NAILING;  Surgeon: JoshJohnny Bridge;  [facility];  Laterality: Right;   GASTRIC BYPASS  2003   JOINT REPLACEMENT     NECK SURGERY  2000   ORIF FEMUR FRACTURE Left 10/15/2020   Procedure: OPEN REDUCTION  INTERNAL FIXATION (ORIF) DISTAL FEMUR FRACTURE;  Surgeon: Shona Needles, Cooke;  Location: [facility];  Service: Orthopedics;  Laterality: Left;   REPLACEMENT TOTAL KNEE  2003   right and left -4 months aoart.   TONSILLECTOMY     WRIST FRACTURE SURGERY Bilateral 08/2011    I have reviewed the social history and family history with the patient and they are unchanged from previous note.  ALLERGIES:  is allergic to nsaids, ibuprofen, and tolmetin.  MEDICATIONS:  Current Outpatient Medications  Medication Sig Dispense Refill   acetaminophen (TYLENOL) 325 MG tablet Take 1-2 tablets (325-650 mg total) by mouth every 6 (six) hours as needed for mild pain, fever or headache. (Patient not taking: Reported on 09/30/2021)     acetaminophen  (TYLENOL) 500 MG tablet Take 1,000 mg by mouth every 6 (six) hours as needed for moderate pain.     acidophilus (RISAQUAD) CAPS capsule Take 1 capsule by mouth daily.     anastrozole (ARIMIDEX) 1 MG tablet TAKE 1 TABLET BY MOUTH  DAILY 90 tablet 3   atenolol (TENORMIN) 25 MG tablet Take 25 mg by mouth daily.     B Complex Vitamins (B COMPLEX 100 PO) Take 1 tablet by mouth daily.     Biotin 10000 MCG TABS Take 10,000 mcg by mouth daily.     buPROPion (WELLBUTRIN XL) 300 MG 24 hr tablet Take 300 mg by mouth daily.     Calcium Carbonate (CALCIUM 600 PO) Take 600 mg by mouth in the morning and at bedtime.     carbamazepine (TEGRETOL XR) 200 MG 12 hr tablet Take 400 mg by mouth 2 (two) times daily.     cholecalciferol (VITAMIN D) 400 units TABS tablet Take 400 Units by mouth daily.     cyclobenzaprine (FLEXERIL) 10 MG tablet Take 1 tablet (10 mg total) by mouth 3 (three) times daily as needed for muscle spasms. 21 tablet 0   diphenhydramine-acetaminophen (TYLENOL PM) 25-500 MG TABS tablet Take 2 tablets by mouth at bedtime.     DULoxetine (CYMBALTA) 30 MG capsule Take 30 mg by mouth 2 (two) times daily.     enoxaparin (LOVENOX) 40 MG/0.4ML injection Inject 0.4 mLs (40 mg total) into the skin daily for 28 days. (Patient not taking: Reported on 09/30/2021) 11.2 mL 0   feeding supplement (ENSURE ENLIVE / ENSURE PLUS) LIQD Take 237 mLs by mouth 2 (two) times daily between meals. (Patient not taking: Reported on 09/30/2021)     ferrous sulfate 325 (65 FE) MG EC tablet Take 325 mg by mouth daily with breakfast.     furosemide (LASIX) 20 MG tablet Take 20 mg by mouth daily as needed for fluid.     gabapentin (NEURONTIN) 400 MG capsule Take 2 capsules (800 mg total) by mouth 4 (four) times daily. (Patient not taking: Reported on 09/30/2021) 20 capsule 0   gabapentin (NEURONTIN) 800 MG tablet Take 800 mg by mouth 4 (four) times daily.     hydrOXYzine (ATARAX/VISTARIL) 50 MG tablet Take 50 mg by mouth daily.  0    latanoprost (XALATAN) 0.005 % ophthalmic solution Place 1 drop into both eyes at bedtime.     levothyroxine (SYNTHROID) 88 MCG tablet Take 88 mcg by mouth daily.     liothyronine (CYTOMEL) 25 MCG tablet Take 25 mcg by mouth daily.     lisinopril (ZESTRIL) 20 MG tablet Take 20 mg by mouth daily.     Multiple Vitamins-Iron (MULTIVITAMIN/IRON PO) Take 1 tablet by mouth  daily.      omeprazole (PRILOSEC) 20 MG capsule Take 20 mg by mouth daily.     oxyCODONE-acetaminophen (PERCOCET) 10-325 MG tablet Take 1 tablet by mouth in the morning, at noon, in the evening, and at bedtime. 20 tablet 0   polyethylene glycol (MIRALAX / GLYCOLAX) 17 g packet Take 17 g by mouth 2 (two) times daily. (Patient taking differently: Take 17 g by mouth daily.) 14 each 0   PROAIR HFA 108 (90 Base) MCG/ACT inhaler Inhale 2 puffs into the lungs every 4 (four) hours as needed for shortness of breath.     QUEtiapine (SEROQUEL XR) 400 MG 24 hr tablet Take 400 mg by mouth at bedtime.     simethicone (MYLICON) 80 MG chewable tablet Chew 80 mg by mouth every 6 (six) hours as needed for flatulence.     No current facility-administered medications for this visit.    PHYSICAL EXAMINATION: ECOG PERFORMANCE STATUS: 1 - Symptomatic but completely ambulatory  Vitals:   10/13/21 1100  BP: (!) 158/92  Pulse: 77  Resp: 18  Temp: 98.4 F (36.9 C)  SpO2: 99%   Filed Weights   10/13/21 1100  Weight: 241 lb 4.8 oz (109.5 kg)    GENERAL:alert, no distress and comfortable SKIN: No rash EYES: sclera clear NECK: Without mass LYMPH:  no palpable cervical or supraclavicular lymphadenopathy  LUNGS:  normal breathing effort HEART:  no lower extremity edema Musculoskeletal: Tenderness and decreased ROM in the right shoulder  NEURO: alert & oriented x 3 with fluent speech Breast exam: Pendulous breasts are symmetrical without nipple discharge or inversion.  S/p left lumpectomy and radiation, incisions completely healed. Multiple  comedones in the axilla. Lateral and central breast thickening/LE from radiation. No palpable mass in either breast or axilla that I could appreciate.   LABORATORY DATA:  I have reviewed the data as listed CBC Latest Ref Rng & Units 10/13/2021 10/07/2021 05/06/2021  WBC 4.0 - 10.5 K/uL 6.1 5.2 3.9(L)  Hemoglobin 12.0 - 15.0 g/dL 12.2 12.2 12.7  Hematocrit 36.0 - 46.0 % 34.9(L) 35.7(L) 35.4(L)  Platelets 150 - 400 K/uL 296 279 284     CMP Latest Ref Rng & Units 10/13/2021 10/07/2021 05/06/2021  Glucose 70 - 99 mg/dL 99 106(H) 98  BUN 8 - 23 mg/dL 25(H) 25(H) 18  Creatinine 0.44 - 1.00 mg/dL 0.69 0.56 0.47  Sodium 135 - 145 mmol/L 129(L) 123(L) 125(L)  Potassium 3.5 - 5.1 mmol/L 5.2(H) 5.1 4.4  Chloride 98 - 111 mmol/L 94(L) 87(L) 90(L)  CO2 22 - 32 mmol/L '28 28 27  ' Calcium 8.9 - 10.3 mg/dL 9.4 9.2 8.8(L)  Total Protein 6.5 - 8.1 g/dL 7.1 - -  Total Bilirubin 0.3 - 1.2 mg/dL 0.4 - -  Alkaline Phos 38 - 126 U/L 106 - -  AST 15 - 41 U/L 25 - -  ALT 0 - 44 U/L 19 - -      RADIOGRAPHIC STUDIES: I have personally reviewed the radiological images as listed and agreed with the findings in the report. No results found.   ASSESSMENT & PLAN: Deborah Cooke is a 71 y.o. female with      1. Malignant neoplasm of upper-outer quadrant of left breast in female, estrogen receptor positive, pT1bN0M0, Stage 1A, ER/PR Positive, Her2 negative, Grade 1  -Diagnosed in 01/2017. S/p left breast lumpectomy and SLN biopsy and adjuvant radiation  -She started adjuvant Tamoxifen in 05/2017. Due to drug interaction with carbamazepine (Tegretol) she was switched  to Anastrozole in 09/2019. She had high copay for Exemestane so she could not take it.  -Mammogram 09/22/21 showed stable posttreatment changes in the left breast, negative for malignancy.  She has breast density category B -continue surveillance and AI, to complete anastrozole 05/2022  2. Osteopenia  -DEXA scan in 04/2018 revealed T score of -1.7 at left femur   -She is on calcium and Vit D and Reclast infusions with her PCP.  She reports no Reclast infusions in the past couple years -Repeat DEXA this year, after she recovers from shoulder surgery   3. Depression and Anxiety -Management per PCP and mental health provider, -Stable on Wellbutrin and Cymbalta   4. Chronic HA and joint pain (neck and shoulders), multiple fractures (wrists, femur) -HA from a previous RTA, nerve damage.  -On chronic pain management contract through [facility]  -chronic pains are stable -Healed left femur fracture -Upcoming right shoulder replacement for severe arthritis -Follow-up Ortho Deborah. Mardelle Matte   5. Thyroid dysfunction -on levothyroxine    6. Obesity, s/p bariatric surgery  -s/p Roux-en-bypass gastric surgery  -Steady weight gain since 2019, contributed by multiple bone fractures     Disposition: Ms. Ahmed is clinically doing well.  Tolerating anastrozole without side effects.  Breast exam is benign.  Cooke there is no clinical concern for breast cancer recurrence.  She is nearly 5 years from initial diagnosis, the recurrence risk decreases significantly.  She will complete 5 years of antiestrogen in 05/2022.    Labs reviewed, CBC is unremarkable, CMP shows NA 129, K5.2 with normal renal and liver function.  PCP aware of hyponatremia, she is not on K supplement.  I recommend to increase number of BMs by 1-2/day for the next 3 days and avoid extra dietary potassium.  She can proceed with DEXA later this year when she recovers from shoulder surgery.  Next mammo 09/2022 will be a screening mammogram.  Return for follow-up surveillance visit in 6 months, or sooner if needed.  Orders Placed This Encounter  Procedures   DG Bone Density    Standing Status:   Future    Standing Expiration Date:   10/13/2022    Scheduling Instructions:     [facility]    Order Specific Question:   Reason for Exam (SYMPTOM  OR DIAGNOSIS REQUIRED)    Answer:   osteopenia    Order Specific  Question:   Preferred imaging location?    Answer:   External     All questions were answered. The patient knows to call the clinic with any problems, questions or concerns. No barriers to learning were detected.      Alla Feeling, NP 10/13/21

## 2021-10-14 ENCOUNTER — Telehealth: Payer: Self-pay | Admitting: Hematology

## 2021-10-14 NOTE — Telephone Encounter (Signed)
Scheduled follow-up appointment per 1/24 los. Patient is aware.

## 2021-10-14 NOTE — H&P (Signed)
SHOULDER ARTHROPLASTY ADMISSION H&P  Patient ID: KHADEJAH SON MRN: [id number] DOB/AGE: 25-Dec-1950 89 y.o.  Chief Complaint: right shoulder pain.  Planned Procedure Date: 10/20/20 Medical Clearance by Scherrie Gerlach, PA-C     HPI: Deborah Cooke is a 71 y.o. female who presents for evaluation of right shoulder DJD. The patient has a history of pain and functional disability in the right shoulder due to arthritis and has failed non-surgical conservative treatments for greater than 12 weeks to include NSAID's and/or analgesics, corticosteriod injections, and activity modification.  Onset of symptoms was gradual, starting 1 years ago with rapidlly worsening course since that time. The patient noted no past surgery on the right shoulder.  Patient currently rates pain at 9 out of 10 with activity. Patient has worsening of pain with activity and weight bearing and pain that interferes with activities of daily living.  Patient has evidence of joint space narrowing by imaging studies.  There is no active infection.  Past Medical History:  Diagnosis Date   Adverse effect of anesthetic 01/2012   difficulty waking up.   Adverse effect of unspecified anesthetic, initial encounter 08/2011   low o2 sats.   Anemia    Anxiety    Arthritis    Breast cancer ([facility]) 02/16/2017   left breast   Chronic pain    Closed supracondylar fracture of right femur ([facility]) 09/18/2012   Depression    Family history of breast cancer    GERD (gastroesophageal reflux disease)    pt denies   Headache(784.0)    Hypertension    Hypothyroidism    Opioid dependence in controlled environment ([place])    Osteoporosis    Osteoporosis with fracture 09/19/2012   Pneumonia    Thyroid disease    Past Surgical History:  Procedure Laterality Date    OPEN REDUCTION INTERNAL FIXATION (ORIF) DISTAL FEMUR FRACTURE (Left Leg Upper)  10/15/2020   BACK SURGERY     fusion mid back, lumbar and thoracic   BREAST BIOPSY Left 02/16/2017    BREAST LUMPECTOMY WITH RADIOACTIVE SEED AND SENTINEL LYMPH NODE BIOPSY Left 03/15/2017   Procedure: LEFT BREAST LUMPECTOMY WITH RADIOACTIVE SEED AND LEFT AXILLARY SENTINEL LYMPH NODE BIOPSY;  Surgeon: [name] Overall, MD;  Location: [place];  Service: General;  Laterality: Left;   CHOLECYSTECTOMY  10/1975   FEMUR IM NAIL  09/18/2012   Procedure: INTRAMEDULLARY (IM) RETROGRADE FEMORAL NAILING;  Surgeon: [name], MD;  Location: [place];  Service: Orthopedics;  Laterality: Right;   GASTRIC BYPASS  2003   JOINT REPLACEMENT     NECK SURGERY  2000   ORIF FEMUR FRACTURE Left 10/15/2020   Procedure: OPEN REDUCTION INTERNAL FIXATION (ORIF) DISTAL FEMUR FRACTURE;  Surgeon: Shona Needles, MD;  Location: [place];  Service: Orthopedics;  Laterality: Left;   REPLACEMENT TOTAL KNEE  2003   right and left -4 months aoart.   TONSILLECTOMY     WRIST FRACTURE SURGERY Bilateral 08/2011   Allergies  Allergen Reactions   Nsaids Nausea And Vomiting and Other (See Comments)    GI Upset (ibuprofen included)   Ibuprofen Other (See Comments)    Other reaction(s): Abdominal Pain   Tolmetin Other (See Comments)    Other reaction(s): Abdominal Pain    Prior to Admission medications   Medication Sig Start Date End Date Taking? Authorizing Provider  acetaminophen (TYLENOL) 500 MG tablet Take 1,000 mg by mouth every 6 (six) hours as needed for moderate pain.   Yes [provider]  acidophilus (RISAQUAD) CAPS capsule Take 1 capsule by mouth daily.   Yes [provider]  anastrozole (ARIMIDEX) 1 MG tablet TAKE 1 TABLET BY MOUTH  DAILY 08/21/21  Yes Truitt Merle, MD  atenolol (TENORMIN) 25 MG tablet Take 25 mg by mouth daily. 08/10/20  Yes [provider]  B Complex Vitamins (B COMPLEX 100 PO) Take 1 tablet by mouth daily.   Yes [provider]  Biotin 10000 MCG TABS Take 10,000 mcg by mouth daily.   Yes [provider]  buPROPion (WELLBUTRIN XL) 300 MG 24  hr tablet Take 300 mg by mouth daily. 06/09/21  Yes [provider]  Calcium Carbonate (CALCIUM 600 PO) Take 600 mg by mouth in the morning and at bedtime.   Yes [provider]  carbamazepine (TEGRETOL XR) 200 MG 12 hr tablet Take 400 mg by mouth 2 (two) times daily. 07/26/18 09/30/21 Yes [provider]  cholecalciferol (VITAMIN D) 400 units TABS tablet Take 400 Units by mouth daily.   Yes [provider]  cyclobenzaprine (FLEXERIL) 10 MG tablet Take 1 tablet (10 mg total) by mouth 3 (three) times daily as needed for muscle spasms. 10/17/20  Yes Corinne Ports, PA-C  diphenhydramine-acetaminophen (TYLENOL PM) 25-500 MG TABS tablet Take 2 tablets by mouth at bedtime.   Yes [provider]  DULoxetine (CYMBALTA) 30 MG capsule Take 30 mg by mouth 2 (two) times daily.   Yes [provider]  ferrous sulfate 325 (65 FE) MG EC tablet Take 325 mg by mouth daily with breakfast.   Yes [provider]  furosemide (LASIX) 20 MG tablet Take 20 mg by mouth daily as needed for fluid.   Yes [provider]  gabapentin (NEURONTIN) 800 MG tablet Take 800 mg by mouth 4 (four) times daily. 08/19/21  Yes [provider]  hydrOXYzine (ATARAX/VISTARIL) 50 MG tablet Take 50 mg by mouth daily. 02/18/17  Yes [provider]  latanoprost (XALATAN) 0.005 % ophthalmic solution Place 1 drop into both eyes at bedtime. 06/25/16  Yes [provider]  levothyroxine (SYNTHROID) 88 MCG tablet Take 88 mcg by mouth daily.   Yes [provider]  liothyronine (CYTOMEL) 25 MCG tablet Take 25 mcg by mouth daily.   Yes [provider]  lisinopril (ZESTRIL) 20 MG tablet Take 20 mg by mouth daily.   Yes [provider]  Multiple Vitamins-Iron (MULTIVITAMIN/IRON PO) Take 1 tablet by mouth daily.    Yes [provider]  omeprazole (PRILOSEC) 20 MG capsule Take 20 mg by mouth daily.   Yes [provider]   oxyCODONE-acetaminophen (PERCOCET) 10-325 MG tablet Take 1 tablet by mouth in the morning, at noon, in the evening, and at bedtime. 10/17/20  Yes McClung, Sarah A, PA-C  polyethylene glycol (MIRALAX / GLYCOLAX) 17 g packet Take 17 g by mouth 2 (two) times daily. Patient taking differently: Take 17 g by mouth daily. 10/21/20  Yes Patrecia Pour, MD  PROAIR HFA 108 [phone] Base) MCG/ACT inhaler Inhale 2 puffs into the lungs every 4 (four) hours as needed for shortness of breath. 07/24/20  Yes [provider]  QUEtiapine (SEROQUEL XR) 400 MG 24 hr tablet Take 400 mg by mouth at bedtime.   Yes [provider]  simethicone (MYLICON) 80 MG chewable tablet Chew 80 mg by mouth every 6 (six) hours as needed for flatulence.   Yes [provider]  acetaminophen (TYLENOL) 325 MG tablet Take 1-2 tablets (  325-650 mg total) by mouth every 6 (six) hours as needed for mild pain, fever or headache. Patient not taking: Reported on 09/30/2021 10/21/20   Patrecia Pour, MD  enoxaparin (LOVENOX) 40 MG/0.4ML injection Inject 0.4 mLs (40 mg total) into the skin daily for 28 days. Patient not taking: Reported on 09/30/2021 10/17/20 11/14/20  Corinne Ports, PA-C  feeding supplement (ENSURE ENLIVE / ENSURE PLUS) LIQD Take 237 mLs by mouth 2 (two) times daily between meals. Patient not taking: Reported on 09/30/2021 10/21/20   Patrecia Pour, MD  gabapentin (NEURONTIN) 400 MG capsule Take 2 capsules (800 mg total) by mouth 4 (four) times daily. Patient not taking: Reported on 09/30/2021 10/17/20   Corinne Ports, PA-C   Social History   Socioeconomic History   Marital status: Married    Spouse name: Not on file   Number of children: Not on file   Years of education: Not on file   Highest education level: Not on file  Occupational History   Not on file  Tobacco Use   Smoking status: Former    Packs/day: 1.00    Years: 28.00    Pack years: 28.00    Types: Cigarettes    Quit date: 01/17/1989    Years  since quitting: 32.7   Smokeless tobacco: Former    Quit date: 01/17/1989  Vaping Use   Vaping Use: Never used  Substance and Sexual Activity   Alcohol use: Yes    Comment: rarely  glass of wine a month   Drug use: No   Sexual activity: Yes    Birth control/protection: None  Other Topics Concern   Not on file  Social History Narrative   Not on file   Social Determinants of Health   Financial Resource Strain: Not on file  Food Insecurity: Not on file  Transportation Needs: Not on file  Physical Activity: Not on file  Stress: Not on file  Social Connections: Not on file   Family History  Problem Relation Age of Onset   Breast cancer Mother 49       died at 37   Cancer Father 29       uretherial cancer     ROS: Currently denies lightheadedness, dizziness, Fever, chills, CP, SOB.   No personal history of DVT, PE, MI, or CVA. No loose teeth or dentures All other systems have been reviewed and were otherwise currently negative with the exception of those mentioned in the HPI and as above.  Objective: Vitals: Ht: 4\' 11"  Wt: 241.1 lbs Temp: 98.2 BP: 122/66 Pulse: 74 O2 94% on room air.   Physical Exam: General: Alert, NAD.   HEENT: EOMI, Good Neck Extension  Pulm: No increased work of breathing.  Clear B/L A/P w/o crackle or wheeze.  CV: RRR, No m/g/r appreciated  GI: soft, NT, ND Neuro: Neuro without gross focal deficit.  Sensation intact distally Skin: No lesions in the area of chief complaint MSK/Surgical Site: right shoulder pain with range of motion.  Forward flexion/abduction approximately 0-60.  Internal rotation to mid lateral right thigh.  External rotation to neutral.  No AC pain.  No Biceps pain.  Decreased Rotator cuff strength.  NVI distally.  Imaging Review Plain radiographs and CT Scan demonstrate severe degenerative joint disease of the right shoulder.    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 The treatment options including medical management, injection therapy, and arthroplasty were discussed at length. The risks and benefits of Procedure(s): REVERSE SHOULDER ARTHROPLASTY were presented and reviewed.  The risks of nonoperative treatment, versus surgical intervention including but not limited to continued pain, aseptic loosening, stiffness, dislocation/subluxation, infection, bleeding, nerve injury, blood clots, cardiopulmonary complications, morbidity, mortality, among others were discussed. The patient verbalizes understanding and wishes to proceed with the plan.    Dental prophylaxis discussed and recommended for 2 years postoperatively.  The patient does  meet the criteria for TXA which will be used perioperatively.   The patient is planning to be discharged home care of husband and mother in law.     Ventura Bruns, PA-C 10/14/2021 1:16 PM

## 2021-10-20 ENCOUNTER — Observation Stay (HOSPITAL_COMMUNITY)
Admission: RE | Admit: 2021-10-20 | Discharge: 2021-10-21 | Disposition: A | Discharge status: Home / Self Care | Payer: Medicare Other | Source: Ambulatory Visit | Attending: Orthopedic Surgery | Admitting: Orthopedic Surgery

## 2021-10-20 ENCOUNTER — Encounter (HOSPITAL_COMMUNITY)
Admission: RE | Disposition: A | Discharge status: Home / Self Care | Payer: Self-pay | Source: Ambulatory Visit | Attending: Orthopedic Surgery

## 2021-10-20 ENCOUNTER — Ambulatory Visit (HOSPITAL_COMMUNITY): Payer: Medicare Other | Admitting: Physician Assistant

## 2021-10-20 ENCOUNTER — Other Ambulatory Visit: Payer: Self-pay

## 2021-10-20 ENCOUNTER — Encounter (HOSPITAL_COMMUNITY): Payer: Self-pay | Admitting: Orthopedic Surgery

## 2021-10-20 ENCOUNTER — Observation Stay (HOSPITAL_COMMUNITY): Payer: Medicare Other

## 2021-10-20 DIAGNOSIS — E039 Hypothyroidism, unspecified: Secondary | ICD-10-CM | POA: Insufficient documentation

## 2021-10-20 DIAGNOSIS — M19011 Primary osteoarthritis, right shoulder: Secondary | ICD-10-CM | POA: Diagnosis present

## 2021-10-20 DIAGNOSIS — Z853 Personal history of malignant neoplasm of breast: Secondary | ICD-10-CM | POA: Diagnosis not present

## 2021-10-20 DIAGNOSIS — Z79899 Other long term (current) drug therapy: Secondary | ICD-10-CM | POA: Diagnosis not present

## 2021-10-20 DIAGNOSIS — M12811 Other specific arthropathies, not elsewhere classified, right shoulder: Secondary | ICD-10-CM

## 2021-10-20 DIAGNOSIS — Z96611 Presence of right artificial shoulder joint: Secondary | ICD-10-CM

## 2021-10-20 DIAGNOSIS — Z87891 Personal history of nicotine dependence: Secondary | ICD-10-CM | POA: Insufficient documentation

## 2021-10-20 DIAGNOSIS — M75101 Unspecified rotator cuff tear or rupture of right shoulder, not specified as traumatic: Secondary | ICD-10-CM | POA: Insufficient documentation

## 2021-10-20 DIAGNOSIS — Z96653 Presence of artificial knee joint, bilateral: Secondary | ICD-10-CM | POA: Diagnosis not present

## 2021-10-20 DIAGNOSIS — I10 Essential (primary) hypertension: Secondary | ICD-10-CM | POA: Insufficient documentation

## 2021-10-20 DIAGNOSIS — Z20822 Contact with and (suspected) exposure to covid-19: Secondary | ICD-10-CM | POA: Insufficient documentation

## 2021-10-20 HISTORY — PX: REVERSE SHOULDER ARTHROPLASTY: SHX5054

## 2021-10-20 LAB — BASIC METABOLIC PANEL
Anion gap: 7 (ref 5–15)
BUN: 20 mg/dL (ref 8–23)
CO2: 27 mmol/L (ref 22–32)
Calcium: 8.8 mg/dL — ABNORMAL LOW (ref 8.9–10.3)
Chloride: 98 mmol/L (ref 98–111)
Creatinine, Ser: 0.56 mg/dL (ref 0.44–1.00)
GFR, Estimated: >60 mL/min (ref >60)
Glucose, Bld: 101 mg/dL — ABNORMAL HIGH (ref 70–99)
Potassium: 4.1 mmol/L (ref 3.5–5.1)
Sodium: 132 mmol/L — ABNORMAL LOW (ref 135–145)

## 2021-10-20 LAB — SARS CORONAVIRUS 2 BY RT PCR (HOSPITAL ORDER, PERFORMED IN ~~LOC~~ HOSPITAL LAB): SARS Coronavirus 2: NEGATIVE

## 2021-10-20 SURGERY — ARTHROPLASTY, SHOULDER, TOTAL, REVERSE
Anesthesia: General | Site: Shoulder | Laterality: Right

## 2021-10-20 MED ORDER — ACETAMINOPHEN 500 MG PO TABS
1000.0000 mg | ORAL_TABLET | Freq: Every day | ORAL | Status: DC
  Administered 2021-10-20: 1000 mg via ORAL
  Filled 2021-10-20: qty 2

## 2021-10-20 MED ORDER — EPHEDRINE SULFATE-NACL 50-0.9 MG/10ML-% IV SOSY
PREFILLED_SYRINGE | INTRAVENOUS | Status: DC | PRN
  Administered 2021-10-20: 5 mg via INTRAVENOUS

## 2021-10-20 MED ORDER — ATENOLOL 25 MG PO TABS
25.0000 mg | ORAL_TABLET | Freq: Every day | ORAL | Status: DC
  Administered 2021-10-21: 25 mg via ORAL
  Filled 2021-10-20 (×2): qty 1

## 2021-10-20 MED ORDER — LISINOPRIL 20 MG PO TABS
20.0000 mg | ORAL_TABLET | Freq: Every day | ORAL | Status: DC
  Administered 2021-10-21: 20 mg via ORAL
  Filled 2021-10-20: qty 1

## 2021-10-20 MED ORDER — ALUM & MAG HYDROXIDE-SIMETH 200-200-20 MG/5ML PO SUSP: 30.0000 mL | ORAL | Status: DC | PRN

## 2021-10-20 MED ORDER — MIDAZOLAM HCL 2 MG/2ML IJ SOLN
1.0000 mg | INTRAMUSCULAR | Status: DC
  Filled 2021-10-20: qty 2

## 2021-10-20 MED ORDER — ONDANSETRON HCL 4 MG/2ML IJ SOLN: 4.0000 mg | Freq: Four times a day (QID) | INTRAMUSCULAR | Status: DC | PRN

## 2021-10-20 MED ORDER — MENTHOL 3 MG MT LOZG: 1.0000 | LOZENGE | OROMUCOSAL | Status: DC | PRN

## 2021-10-20 MED ORDER — PHENOL 1.4 % MT LIQD: 1.0000 | OROMUCOSAL | Status: DC | PRN

## 2021-10-20 MED ORDER — PROPOFOL 10 MG/ML IV BOLUS
INTRAVENOUS | Status: DC | PRN
  Administered 2021-10-20: 20 mg via INTRAVENOUS
  Administered 2021-10-20: 30 mg via INTRAVENOUS
  Administered 2021-10-20: 100 mg via INTRAVENOUS
  Administered 2021-10-20: 30 mg via INTRAVENOUS

## 2021-10-20 MED ORDER — DEXAMETHASONE SODIUM PHOSPHATE 10 MG/ML IJ SOLN
INTRAMUSCULAR | Status: DC | PRN
  Administered 2021-10-20: 10 mg via INTRAVENOUS

## 2021-10-20 MED ORDER — LIDOCAINE 2% (20 MG/ML) 5 ML SYRINGE
INTRAMUSCULAR | Status: DC | PRN
  Administered 2021-10-20: 60 mg via INTRAVENOUS
  Administered 2021-10-20: 40 mg via INTRAVENOUS

## 2021-10-20 MED ORDER — HYDROMORPHONE HCL 2 MG PO TABS
2.0000 mg | ORAL_TABLET | ORAL | 0 refills | Status: DC | PRN
Start: 2021-10-20 — End: 2021-11-11

## 2021-10-20 MED ORDER — BUPROPION HCL ER (XL) 300 MG PO TB24
300.0000 mg | ORAL_TABLET | Freq: Every day | ORAL | Status: DC
  Administered 2021-10-21: 300 mg via ORAL
  Filled 2021-10-20 (×2): qty 1

## 2021-10-20 MED ORDER — LATANOPROST 0.005 % OP SOLN
1.0000 [drp] | Freq: Every day | OPHTHALMIC | Status: DC
  Administered 2021-10-20: 1 [drp] via OPHTHALMIC
  Filled 2021-10-20: qty 2.5

## 2021-10-20 MED ORDER — LEVOTHYROXINE SODIUM 88 MCG PO TABS
88.0000 ug | ORAL_TABLET | Freq: Every day | ORAL | Status: DC
  Administered 2021-10-21: 88 ug via ORAL
  Filled 2021-10-20: qty 1

## 2021-10-20 MED ORDER — BUPIVACAINE LIPOSOME 1.3 % IJ SUSP
INTRAMUSCULAR | Status: DC | PRN
  Administered 2021-10-20: 10 mL via PERINEURAL

## 2021-10-20 MED ORDER — BUPIVACAINE HCL (PF) 0.5 % IJ SOLN
INTRAMUSCULAR | Status: DC | PRN
Start: 2021-10-20 — End: 2021-10-20
  Administered 2021-10-20: 10 mL via PERINEURAL

## 2021-10-20 MED ORDER — HYDRALAZINE HCL 20 MG/ML IJ SOLN
INTRAMUSCULAR | Status: AC
  Filled 2021-10-20: qty 1

## 2021-10-20 MED ORDER — CYCLOBENZAPRINE HCL 10 MG PO TABS: 10.0000 mg | ORAL_TABLET | Freq: Three times a day (TID) | ORAL | Status: DC | PRN

## 2021-10-20 MED ORDER — FERROUS SULFATE 325 (65 FE) MG PO TABS
325.0000 mg | ORAL_TABLET | Freq: Every day | ORAL | Status: DC
  Administered 2021-10-21: 325 mg via ORAL
  Filled 2021-10-20: qty 1

## 2021-10-20 MED ORDER — POVIDONE-IODINE 10 % EX SWAB
2.0000 "application " | Freq: Once | CUTANEOUS | Status: AC
  Administered 2021-10-20: 2 via TOPICAL

## 2021-10-20 MED ORDER — 0.9 % SODIUM CHLORIDE (POUR BTL) OPTIME
TOPICAL | Status: DC | PRN
  Administered 2021-10-20: 1000 mL

## 2021-10-20 MED ORDER — TRANEXAMIC ACID-NACL 1000-0.7 MG/100ML-% IV SOLN
1000.0000 mg | Freq: Once | INTRAVENOUS | Status: AC
  Administered 2021-10-20: 1000 mg via INTRAVENOUS
  Filled 2021-10-20: qty 100

## 2021-10-20 MED ORDER — ROCURONIUM BROMIDE 10 MG/ML (PF) SYRINGE
PREFILLED_SYRINGE | INTRAVENOUS | Status: AC
  Filled 2021-10-20: qty 10

## 2021-10-20 MED ORDER — DOCUSATE SODIUM 100 MG PO CAPS
100.0000 mg | ORAL_CAPSULE | Freq: Two times a day (BID) | ORAL | Status: DC
  Administered 2021-10-20: 100 mg via ORAL
  Filled 2021-10-20 (×2): qty 1

## 2021-10-20 MED ORDER — POLYETHYLENE GLYCOL 3350 17 G PO PACK
17.0000 g | PACK | Freq: Every day | ORAL | Status: DC
  Filled 2021-10-20: qty 1

## 2021-10-20 MED ORDER — DIPHENHYDRAMINE-APAP (SLEEP) 25-500 MG PO TABS: 2.0000 | ORAL_TABLET | Freq: Every day | ORAL | Status: DC

## 2021-10-20 MED ORDER — OXYCODONE HCL 5 MG PO TABS
5.0000 mg | ORAL_TABLET | Freq: Four times a day (QID) | ORAL | Status: DC
  Administered 2021-10-20 – 2021-10-21 (×4): 5 mg via ORAL
  Filled 2021-10-20 (×4): qty 1

## 2021-10-20 MED ORDER — ORAL CARE MOUTH RINSE: 15.0000 mL | Freq: Once | OROMUCOSAL | Status: AC

## 2021-10-20 MED ORDER — PROPOFOL 10 MG/ML IV BOLUS
INTRAVENOUS | Status: AC
  Filled 2021-10-20: qty 20

## 2021-10-20 MED ORDER — ANASTROZOLE 1 MG PO TABS
1.0000 mg | ORAL_TABLET | Freq: Every day | ORAL | Status: DC
  Administered 2021-10-21: 1 mg via ORAL
  Filled 2021-10-20: qty 1

## 2021-10-20 MED ORDER — PHENYLEPHRINE 40 MCG/ML (10ML) SYRINGE FOR IV PUSH (FOR BLOOD PRESSURE SUPPORT)
PREFILLED_SYRINGE | INTRAVENOUS | Status: DC | PRN
  Administered 2021-10-20: 80 ug via INTRAVENOUS

## 2021-10-20 MED ORDER — OXYCODONE-ACETAMINOPHEN 5-325 MG PO TABS
1.0000 | ORAL_TABLET | Freq: Four times a day (QID) | ORAL | Status: DC
  Administered 2021-10-20 – 2021-10-21 (×4): 1 via ORAL
  Filled 2021-10-20 (×4): qty 1

## 2021-10-20 MED ORDER — PHENYLEPHRINE 40 MCG/ML (10ML) SYRINGE FOR IV PUSH (FOR BLOOD PRESSURE SUPPORT): PREFILLED_SYRINGE | INTRAVENOUS | Status: DC | PRN

## 2021-10-20 MED ORDER — ONDANSETRON HCL 4 MG/2ML IJ SOLN
INTRAMUSCULAR | Status: DC | PRN
  Administered 2021-10-20: 4 mg via INTRAVENOUS

## 2021-10-20 MED ORDER — HYDROMORPHONE HCL 1 MG/ML IJ SOLN: 0.5000 mg | INTRAMUSCULAR | Status: DC | PRN

## 2021-10-20 MED ORDER — TRANEXAMIC ACID-NACL 1000-0.7 MG/100ML-% IV SOLN
1000.0000 mg | INTRAVENOUS | Status: AC
  Administered 2021-10-20: 1000 mg via INTRAVENOUS
  Filled 2021-10-20: qty 100

## 2021-10-20 MED ORDER — LIOTHYRONINE SODIUM 25 MCG PO TABS
25.0000 ug | ORAL_TABLET | Freq: Every day | ORAL | Status: DC
  Administered 2021-10-21: 25 ug via ORAL
  Filled 2021-10-20: qty 1

## 2021-10-20 MED ORDER — LIDOCAINE HCL (PF) 2 % IJ SOLN
INTRAMUSCULAR | Status: AC
  Filled 2021-10-20: qty 5

## 2021-10-20 MED ORDER — CEFAZOLIN SODIUM-DEXTROSE 2-4 GM/100ML-% IV SOLN
2.0000 g | INTRAVENOUS | Status: AC
  Administered 2021-10-20: 2 g via INTRAVENOUS
  Filled 2021-10-20: qty 100

## 2021-10-20 MED ORDER — HYDRALAZINE HCL 20 MG/ML IJ SOLN
10.0000 mg | Freq: Once | INTRAMUSCULAR | Status: AC
  Administered 2021-10-20: 10 mg via INTRAVENOUS

## 2021-10-20 MED ORDER — PANTOPRAZOLE SODIUM 40 MG PO TBEC
40.0000 mg | DELAYED_RELEASE_TABLET | Freq: Every day | ORAL | Status: DC
  Administered 2021-10-21: 40 mg via ORAL
  Filled 2021-10-20: qty 1

## 2021-10-20 MED ORDER — DEXAMETHASONE SODIUM PHOSPHATE 10 MG/ML IJ SOLN
INTRAMUSCULAR | Status: AC
  Filled 2021-10-20: qty 1

## 2021-10-20 MED ORDER — PHENYLEPHRINE HCL-NACL 20-0.9 MG/250ML-% IV SOLN
INTRAVENOUS | Status: DC | PRN
Start: 2021-10-20 — End: 2021-10-20
  Administered 2021-10-20: 35 ug/min via INTRAVENOUS

## 2021-10-20 MED ORDER — METOCLOPRAMIDE HCL 5 MG PO TABS: 5.0000 mg | ORAL_TABLET | Freq: Three times a day (TID) | ORAL | Status: DC | PRN

## 2021-10-20 MED ORDER — PHENYLEPHRINE 40 MCG/ML (10ML) SYRINGE FOR IV PUSH (FOR BLOOD PRESSURE SUPPORT)
PREFILLED_SYRINGE | INTRAVENOUS | Status: AC
  Filled 2021-10-20: qty 10

## 2021-10-20 MED ORDER — CEFAZOLIN SODIUM-DEXTROSE 2-4 GM/100ML-% IV SOLN
2.0000 g | Freq: Four times a day (QID) | INTRAVENOUS | Status: AC
  Administered 2021-10-20 – 2021-10-21 (×2): 2 g via INTRAVENOUS
  Filled 2021-10-20 (×3): qty 100

## 2021-10-20 MED ORDER — BUPIVACAINE HCL 0.25 % IJ SOLN
INTRAMUSCULAR | Status: AC
  Filled 2021-10-20: qty 1

## 2021-10-20 MED ORDER — METOCLOPRAMIDE HCL 5 MG/ML IJ SOLN: 5.0000 mg | Freq: Three times a day (TID) | INTRAMUSCULAR | Status: DC | PRN

## 2021-10-20 MED ORDER — MAGNESIUM HYDROXIDE 400 MG/5ML PO SUSP: 30.0000 mL | Freq: Every day | ORAL | Status: DC | PRN

## 2021-10-20 MED ORDER — LACTATED RINGERS IV SOLN: INTRAVENOUS | Status: DC

## 2021-10-20 MED ORDER — ONDANSETRON HCL 4 MG/2ML IJ SOLN
INTRAMUSCULAR | Status: AC
  Filled 2021-10-20: qty 2

## 2021-10-20 MED ORDER — DULOXETINE HCL 30 MG PO CPEP
30.0000 mg | ORAL_CAPSULE | Freq: Two times a day (BID) | ORAL | Status: DC
  Administered 2021-10-20 – 2021-10-21 (×2): 30 mg via ORAL
  Filled 2021-10-20 (×2): qty 1

## 2021-10-20 MED ORDER — HYDROMORPHONE HCL 2 MG PO TABS
1.0000 mg | ORAL_TABLET | ORAL | Status: DC | PRN
Start: 2021-10-20 — End: 2021-10-21
  Administered 2021-10-21 (×2): 1 mg via ORAL
  Filled 2021-10-20 (×2): qty 1

## 2021-10-20 MED ORDER — ACETAMINOPHEN 500 MG PO TABS
1000.0000 mg | ORAL_TABLET | Freq: Once | ORAL | Status: AC
  Administered 2021-10-20: 1000 mg via ORAL
  Filled 2021-10-20: qty 2

## 2021-10-20 MED ORDER — ROCURONIUM BROMIDE 10 MG/ML (PF) SYRINGE
PREFILLED_SYRINGE | INTRAVENOUS | Status: DC | PRN
  Administered 2021-10-20: 50 mg via INTRAVENOUS

## 2021-10-20 MED ORDER — ACETAMINOPHEN 325 MG PO TABS: 325.0000 mg | ORAL_TABLET | Freq: Four times a day (QID) | ORAL | Status: DC | PRN

## 2021-10-20 MED ORDER — OXYCODONE-ACETAMINOPHEN 10-325 MG PO TABS: 1.0000 | ORAL_TABLET | Freq: Four times a day (QID) | ORAL | Status: DC

## 2021-10-20 MED ORDER — ONDANSETRON HCL 4 MG PO TABS: 4.0000 mg | ORAL_TABLET | Freq: Four times a day (QID) | ORAL | Status: DC | PRN

## 2021-10-20 MED ORDER — CHLORHEXIDINE GLUCONATE 0.12 % MT SOLN
15.0000 mL | Freq: Once | OROMUCOSAL | Status: AC
  Administered 2021-10-20: 15 mL via OROMUCOSAL

## 2021-10-20 MED ORDER — RISAQUAD PO CAPS
1.0000 | ORAL_CAPSULE | Freq: Every day | ORAL | Status: DC
  Administered 2021-10-20 – 2021-10-21 (×2): 1 via ORAL
  Filled 2021-10-20 (×2): qty 1

## 2021-10-20 MED ORDER — HYDROXYZINE HCL 25 MG PO TABS
50.0000 mg | ORAL_TABLET | Freq: Every day | ORAL | Status: DC
  Administered 2021-10-21: 50 mg via ORAL
  Filled 2021-10-20: qty 2

## 2021-10-20 MED ORDER — FENTANYL CITRATE (PF) 100 MCG/2ML IJ SOLN
INTRAMUSCULAR | Status: AC
  Filled 2021-10-20: qty 2

## 2021-10-20 MED ORDER — FENTANYL CITRATE PF 50 MCG/ML IJ SOSY
25.0000 ug | PREFILLED_SYRINGE | INTRAMUSCULAR | Status: AC | PRN
  Administered 2021-10-20 (×4): 25 ug via INTRAVENOUS

## 2021-10-20 MED ORDER — ONDANSETRON HCL 4 MG PO TABS: 4.0000 mg | ORAL_TABLET | Freq: Three times a day (TID) | ORAL | 0 refills | Status: DC | PRN

## 2021-10-20 MED ORDER — FENTANYL CITRATE PF 50 MCG/ML IJ SOSY
PREFILLED_SYRINGE | INTRAMUSCULAR | Status: AC
  Administered 2021-10-20: 25 ug via INTRAVENOUS
  Filled 2021-10-20: qty 2

## 2021-10-20 MED ORDER — FENTANYL CITRATE PF 50 MCG/ML IJ SOSY
PREFILLED_SYRINGE | INTRAMUSCULAR | Status: AC
  Administered 2021-10-20: 25 ug via INTRAVENOUS
  Filled 2021-10-20: qty 1

## 2021-10-20 MED ORDER — HYDROMORPHONE HCL 1 MG/ML IJ SOLN
0.5000 mg | INTRAMUSCULAR | Status: AC | PRN
  Administered 2021-10-20: 0.5 mg via INTRAVENOUS

## 2021-10-20 MED ORDER — SUGAMMADEX SODIUM 200 MG/2ML IV SOLN
INTRAVENOUS | Status: DC | PRN
  Administered 2021-10-20: 200 mg via INTRAVENOUS

## 2021-10-20 MED ORDER — QUETIAPINE FUMARATE ER 200 MG PO TB24
400.0000 mg | ORAL_TABLET | Freq: Every day | ORAL | Status: DC
  Administered 2021-10-20: 400 mg via ORAL
  Filled 2021-10-20 (×2): qty 2

## 2021-10-20 MED ORDER — BISACODYL 10 MG RE SUPP: 10.0000 mg | Freq: Every day | RECTAL | Status: DC | PRN

## 2021-10-20 MED ORDER — DIPHENHYDRAMINE HCL 25 MG PO CAPS
50.0000 mg | ORAL_CAPSULE | Freq: Every day | ORAL | Status: DC
  Administered 2021-10-20: 50 mg via ORAL
  Filled 2021-10-20: qty 2

## 2021-10-20 MED ORDER — ONDANSETRON HCL 4 MG/2ML IJ SOLN: 4.0000 mg | Freq: Once | INTRAMUSCULAR | Status: DC | PRN

## 2021-10-20 MED ORDER — ALBUTEROL SULFATE (2.5 MG/3ML) 0.083% IN NEBU: 3.0000 mL | INHALATION_SOLUTION | RESPIRATORY_TRACT | Status: DC | PRN

## 2021-10-20 MED ORDER — FENTANYL CITRATE PF 50 MCG/ML IJ SOSY
25.0000 ug | PREFILLED_SYRINGE | INTRAMUSCULAR | Status: DC
  Administered 2021-10-20: 50 ug via INTRAVENOUS
  Filled 2021-10-20: qty 2

## 2021-10-20 MED ORDER — GABAPENTIN 400 MG PO CAPS
800.0000 mg | ORAL_CAPSULE | Freq: Four times a day (QID) | ORAL | Status: DC
  Administered 2021-10-20 – 2021-10-21 (×3): 800 mg via ORAL
  Filled 2021-10-20 (×3): qty 2

## 2021-10-20 MED ORDER — HYDROMORPHONE HCL 1 MG/ML IJ SOLN
INTRAMUSCULAR | Status: AC
  Administered 2021-10-20: 0.5 mg via INTRAVENOUS
  Filled 2021-10-20: qty 1

## 2021-10-20 MED ORDER — POTASSIUM CHLORIDE IN NACL 20-0.45 MEQ/L-% IV SOLN
INTRAVENOUS | Status: DC
  Filled 2021-10-20 (×2): qty 1000

## 2021-10-20 SURGICAL SUPPLY — 73 items
AID PSTN UNV HD RSTRNT DISP (MISCELLANEOUS) ×1
BAG COUNTER SPONGE SURGICOUNT (BAG) ×1 IMPLANT
BAG SPEC THK2 15X12 ZIP CLS (MISCELLANEOUS) ×1
BAG SPNG CNTER NS LX DISP (BAG) ×1
BAG ZIPLOCK 12X15 (MISCELLANEOUS) ×2 IMPLANT
BASEPLATE GLENOSPHERE 25 (Plate) ×1 IMPLANT
BEARING CROSSLINK RSA 36 (Joint) ×1 IMPLANT
BIT DRILL QUICK REL 1/8 2PK SL (DRILL) IMPLANT
BIT DRILL TWIST 2.7 (BIT) ×1 IMPLANT
BLADE SAW SAG 73X25 THK (BLADE) ×1
BLADE SAW SGTL 73X25 THK (BLADE) ×1 IMPLANT
BLADE SURG SZ10 CARB STEEL (BLADE) ×1 IMPLANT
BOOTIES KNEE HIGH SLOAN (MISCELLANEOUS) ×1 IMPLANT
BOWL SMART MIX CTS (DISPOSABLE) IMPLANT
BRNG HUM STD 36 RVRS SHDR PRLG (Joint) ×1 IMPLANT
CLSR STERI-STRIP ANTIMIC 1/2X4 (GAUZE/BANDAGES/DRESSINGS) ×3 IMPLANT
COVER BACK TABLE 60X90IN (DRAPES) ×2 IMPLANT
COVER MAYO STAND STRL (DRAPES) ×1 IMPLANT
COVER SURGICAL LIGHT HANDLE (MISCELLANEOUS) ×2 IMPLANT
DECANTER SPIKE VIAL GLASS SM (MISCELLANEOUS) ×1 IMPLANT
DRAPE INCISE IOBAN 66X45 STRL (DRAPES) ×1 IMPLANT
DRAPE ORTHO SPLIT 77X108 STRL (DRAPES) ×4
DRAPE SHEET LG 3/4 BI-LAMINATE (DRAPES) ×3 IMPLANT
DRAPE SURG 17X11 SM STRL (DRAPES) ×2 IMPLANT
DRAPE SURG ORHT 6 SPLT 77X108 (DRAPES) IMPLANT
DRAPE U-SHAPE 47X51 STRL (DRAPES) ×2 IMPLANT
DRILL QUICK RELEASE 1/8 INCH (DRILL) ×2
DRSG MEPILEX BORDER 4X8 (GAUZE/BANDAGES/DRESSINGS) ×2 IMPLANT
DURAPREP 26ML APPLICATOR (WOUND CARE) ×4 IMPLANT
ELECT BLADE TIP CTD 4 INCH (ELECTRODE) ×1 IMPLANT
ELECT REM PT RETURN 15FT ADLT (MISCELLANEOUS) ×2 IMPLANT
FACESHIELD WRAPAROUND (MASK) ×2 IMPLANT
FACESHIELD WRAPAROUND OR TEAM (MASK) IMPLANT
GLENOID SPHERE STD STRL 36MM (Orthopedic Implant) ×1 IMPLANT
GLOVE SRG 8 PF TXTR STRL LF DI (GLOVE) ×1 IMPLANT
GLOVE SURG ENC MOIS LTX SZ7 (GLOVE) ×2 IMPLANT
GLOVE SURG ORTHO LTX SZ7.5 (GLOVE) ×2 IMPLANT
GLOVE SURG UNDER POLY LF SZ7 (GLOVE) ×2 IMPLANT
GLOVE SURG UNDER POLY LF SZ8 (GLOVE) ×2
GOWN STRL REUS W/TWL 2XL LVL3 (GOWN DISPOSABLE) ×2 IMPLANT
GOWN STRL REUS W/TWL LRG LVL3 (GOWN DISPOSABLE) ×4 IMPLANT
HOOD PEEL AWAY FLYTE STAYCOOL (MISCELLANEOUS) ×6 IMPLANT
KIT BASIN OR (CUSTOM PROCEDURE TRAY) ×2 IMPLANT
KIT TURNOVER KIT A (KITS) IMPLANT
PACK SHOULDER (CUSTOM PROCEDURE TRAY) ×2 IMPLANT
PAD COLD SHLDR WRAP-ON (PAD) ×1 IMPLANT
PIN THREADED REVERSE (PIN) ×1 IMPLANT
PROTECTOR NERVE ULNAR (MISCELLANEOUS) ×2 IMPLANT
RESTRAINT HEAD UNIVERSAL NS (MISCELLANEOUS) ×2 IMPLANT
SCREW BONE LOCKING 4.75X30X3.5 (Screw) ×1 IMPLANT
SCREW BONE STRL 6.5MMX25MM (Screw) ×1 IMPLANT
SCREW LOCKING 4.75MMX15MM (Screw) ×2 IMPLANT
SCREW LOCKING STRL 4.75X25X3.5 (Screw) ×1 IMPLANT
SLING ARM IMMOBILIZER LRG (SOFTGOODS) ×2 IMPLANT
SPONGE T-LAP 18X18 ~~LOC~~+RFID (SPONGE) ×2 IMPLANT
SPONGE T-LAP 4X18 ~~LOC~~+RFID (SPONGE) ×2 IMPLANT
STEM HUMERAL STRL 11MMX55MM (Stem) ×1 IMPLANT
STRIP CLOSURE SKIN 1/2X4 (GAUZE/BANDAGES/DRESSINGS) ×2 IMPLANT
SUCTION FRAZIER HANDLE 12FR (TUBING) ×2
SUCTION TUBE FRAZIER 12FR DISP (TUBING) ×1 IMPLANT
SUPPORT WRAP ARM LG (MISCELLANEOUS) ×2 IMPLANT
SUT ETHIBOND NAB CT1 #1 30IN (SUTURE) ×1 IMPLANT
SUT MAXBRAID #2 CVD NDL (SUTURE) ×1 IMPLANT
SUT MAXBRAID #5 CCS-NDL 2PK (SUTURE) ×1 IMPLANT
SUT VIC AB 1 CT1 36 (SUTURE) ×5 IMPLANT
SUT VIC AB 2-0 CT1 27 (SUTURE) ×4
SUT VIC AB 2-0 CT1 TAPERPNT 27 (SUTURE) ×1 IMPLANT
SUT VIC AB 3-0 SH 8-18 (SUTURE) ×2 IMPLANT
TOWEL OR 17X26 10 PK STRL BLUE (TOWEL DISPOSABLE) ×2 IMPLANT
TOWEL OR NON WOVEN STRL DISP B (DISPOSABLE) ×2 IMPLANT
TOWER CARTRIDGE SMART MIX (DISPOSABLE) IMPLANT
TRAY HUM MINI SHOULDER +0 40D (Shoulder) ×1 IMPLANT
TUBE SUCTION HIGH CAP CLEAR NV (SUCTIONS) ×1 IMPLANT

## 2021-10-20 NOTE — Anesthesia Procedure Notes (Signed)
Procedure Name: Intubation Date/Time: 10/20/2021 2:10 PM Performed by: Maxwell Caul, CRNA Pre-anesthesia Checklist: Patient identified, Emergency Drugs available, Suction available and Patient being monitored Patient Re-evaluated:Patient Re-evaluated prior to induction Oxygen Delivery Method: Circle system utilized Preoxygenation: Pre-oxygenation with 100% oxygen Induction Type: IV induction Ventilation: Mask ventilation without difficulty Laryngoscope Size: Mac and 4 Grade View: Grade I Tube type: Oral Tube size: 7.0 mm Number of attempts: 1 Airway Equipment and Method: Stylet Placement Confirmation: ETT inserted through vocal cords under direct vision, positive ETCO2 and breath sounds checked- equal and bilateral Secured at: 21 cm Tube secured with: Tape Dental Injury: Teeth and Oropharynx as per pre-operative assessment

## 2021-10-20 NOTE — Anesthesia Procedure Notes (Signed)
Anesthesia Regional Block: Interscalene brachial plexus block   Pre-Anesthetic Checklist: , timeout performed,  Correct Patient, Correct Site, Correct Laterality,  Correct Procedure, Correct Position, site marked,  Risks and benefits discussed,  Surgical consent,  Pre-op evaluation,  At surgeon's request and post-op pain management  Laterality: Right  Prep: chloraprep       Needles:  Injection technique: Single-shot  Needle Type: Echogenic Stimulator Needle     Needle Length: 5cm  Needle Gauge: 22     Additional Needles:   Procedures:,,,, ultrasound used (permanent image in chart),,    Narrative:  Start time: 10/20/2021 12:47 PM End time: 10/20/2021 12:53 PM Injection made incrementally with aspirations every 5 mL.  Performed by: Personally  Anesthesiologist: Santa Lighter, MD  Additional Notes: Functioning IV was confirmed and monitors were applied.  A 93mm 22ga Arrow echogenic stimulator needle was used. Sterile prep and drape, hand hygiene, and sterile gloves were used.  Negative aspiration and negative test dose prior to incremental administration of local anesthetic. The patient tolerated the procedure well.  Ultrasound guidance: relevent anatomy identified, needle position confirmed, local anesthetic spread visualized around nerve(s), vascular puncture avoided.  Image printed for medical record.

## 2021-10-20 NOTE — Op Note (Signed)
10/20/2021  4:10 PM  PATIENT:  Deborah Cooke    PRE-OPERATIVE DIAGNOSIS: Right rotator cuff arthropathy  POST-OPERATIVE DIAGNOSIS:  Same  PROCEDURE: RIGHT reverse Total Shoulder Arthroplasty  SURGEON:  [name], MD  PHYSICIAN ASSISTANT: Merlene Pulling, PA-C, present and scrubbed throughout the case, critical for completion in a timely fashion, and for retraction, instrumentation, and closure.  ANESTHESIA:   General with interscalene block using Exparel  ESTIMATED BLOOD LOSS: 100 mL  UNIQUE ASPECTS OF THE CASE: Access was somewhat challenging given her body habitus, BMI of almost 50.  The humeral head had substantial deformity, and getting it externally rotated and dislocated was challenging.  The supraspinatus was dysfunctional, and scarred down and atrophic.  The subscapularis was present, but not really normal, but I did get a fairly robust repair.  There was a large soft tissue sleeve with bursal fluid accumulation diffusely anteriorly and superiorly.  On the glenoid preparation, I had planned to take very little anteriorly, however ultimately after removing the reamer, it looks like actually took a little bit more anteriorly than posteriorly.  I matched the surface superiorly.  I suspect that the ultimate glenosphere was slightly anteverted, but I was contained within the bone, and had good purchase on the glenoid and a stable construct.  On the humeral side, I did ream up to a size 12 and placed a size 11, although the real implant had fairly stout fixation and did not completely seat, but I did have reasonably good soft tissue tension with reduction of the construct.  It was just slightly more than 2 finger tightness.  PREOPERATIVE INDICATIONS:  Deborah Cooke is a  71 y.o. female with a diagnosis of right shoulder DJD who failed conservative measures and elected for surgical management.    The risks benefits and alternatives were discussed with the patient preoperatively including  but not limited to the risks of infection, bleeding, nerve injury, cardiopulmonary complications, the need for revision surgery, dislocation, brachial plexus palsy, incomplete relief of pain, among others, and the patient was willing to proceed.  OPERATIVE IMPLANTS: Biomet size 11 micro humeral stem press-fit with a 40 + 0 mm offset reverse shoulder arthroplasty tray with a 36 mm Vivacit-E standardy polyethylene liner and a 36 mm glenosphere set on B placed with inferior offset, with a mini baseplate and 4 locking screws and one central nonlocking screw.  OPERATIVE FINDINGS: Advanced erosive rotator cuff arthropathy with dysfunctional supraspinatus.  OPERATIVE PROCEDURE: The patient was brought to the operating room and placed in the supine position. General anesthesia was administered. IV antibiotics were given.  Time out was performed. The upper extremity was prepped and draped in usual sterile fashion. The patient was in a beachchair position. Deltopectoral approach was carried out. The biceps was tenodesed to the pectoralis tendon with #2 max braid. The subscapularis was released off of the bone.   I then performed circumferential releases of the humerus, and then dislocated the head, and then reamed with the reamer to the above named size.  I then applied the jig, and cut the humeral head in 30 of retroversion, and then turned my attention to the glenoid.  Deep retractors were placed, and I resected the labrum, and then placed a guidepin into the center position on the glenoid, with slight inferior inclination. I then reamed over the guidepin, and this created a small metaphyseal cancellus blush inferiorly, removing just the cartilage to the subchondral bone superiorly. The base plate was selected and impacted  place, and then I secured it centrally with a nonlocking screw, and I had excellent purchase both inferiorly and superiorly. I placed a short locking screws on anterior and posterior  aspects.  I then turned my attention to the glenosphere, and impacted this into place, placing slight inferior offset (set on B).   The glenosphere was completely seated, and had engagement of the [facility] taper. I then turned my attention back to the humerus.  I sequentially broached, and then trialed, and was found to restore soft tissue tension, and it had 2 finger tightness. Therefore the above named components were selected. The shoulder felt stable throughout functional motion.  Before I placed the real prosthesis I had also placed a total of 1 #2 max braid and 2 #5 max braid through drill holes in the humerus for later subscapularis repair.  I then impacted the real prosthesis into place, as well as the real humeral tray, and reduced the shoulder. The shoulder had excellent motion, and was stable, and I irrigated the wounds copiously.    I then used these to repair the subscapularis. This came down to bone.  I then irrigated the shoulder copiously once more, repaired the deltopectoral interval with Vicryl followed by subcutaneous Vicryl with Steri-Strips and sterile gauze for the skin. The patient was awakened and returned back in stable and satisfactory condition. There were no complications and She tolerated the procedure well.

## 2021-10-20 NOTE — Progress Notes (Signed)
Assisted Dr. Turk with right, ultrasound guided, interscalene  block. Side rails up, monitors on throughout procedure. See vital signs in flow sheet. Tolerated Procedure well. 

## 2021-10-20 NOTE — Anesthesia Preprocedure Evaluation (Signed)
Anesthesia Evaluation  Patient identified by MRN, date of birth, ID band Patient awake    Reviewed: Allergy & Precautions, NPO status , Patient's Chart, lab work & pertinent test results, reviewed documented beta blocker date and time   History of Anesthesia Complications (+) PROLONGED EMERGENCE and history of anesthetic complications  Airway Mallampati: II  TM Distance: >3 FB Neck ROM: Full    Dental  (+) Dental Advisory Given, Edentulous Lower, Edentulous Upper   Pulmonary former smoker,    Pulmonary exam normal breath sounds clear to auscultation       Cardiovascular hypertension, Pt. on home beta blockers and Pt. on medications Normal cardiovascular exam Rhythm:Regular Rate:Normal     Neuro/Psych  Headaches, PSYCHIATRIC DISORDERS Anxiety Depression    GI/Hepatic Neg liver ROS, GERD  Medicated,  Endo/Other  Hypothyroidism Morbid obesity  Renal/GU negative Renal ROS     Musculoskeletal  (+) Arthritis , Fibromyalgia -, narcotic dependent  Abdominal   Peds  Hematology negative hematology ROS (+)   Anesthesia Other Findings Day of surgery medications reviewed with the patient.  Reproductive/Obstetrics                             Anesthesia Physical Anesthesia Plan  ASA: 3  Anesthesia Plan: General   Post-op Pain Management: Regional block and Tylenol PO (pre-op)   Induction: Intravenous  PONV Risk Score and Plan: 3 and Dexamethasone and Ondansetron  Airway Management Planned: Oral ETT  Additional Equipment:   Intra-op Plan:   Post-operative Plan: Extubation in OR  Informed Consent: I have reviewed the patients History and Physical, chart, labs and discussed the procedure including the risks, benefits and alternatives for the proposed anesthesia with the patient or authorized representative who has indicated his/her understanding and acceptance.     Dental advisory  given  Plan Discussed with: CRNA  Anesthesia Plan Comments:         Anesthesia Quick Evaluation

## 2021-10-20 NOTE — Anesthesia Postprocedure Evaluation (Signed)
Anesthesia Post Note  Patient: Deborah Cooke  Procedure(s) Performed: REVERSE SHOULDER ARTHROPLASTY (Right: Shoulder)     Patient location during evaluation: PACU Anesthesia Type: General Level of consciousness: awake and alert Pain management: pain level controlled Vital Signs Assessment: post-procedure vital signs reviewed and stable Respiratory status: spontaneous breathing, nonlabored ventilation, respiratory function stable and patient connected to nasal cannula oxygen Cardiovascular status: blood pressure returned to baseline and stable Postop Assessment: no apparent nausea or vomiting Anesthetic complications: no   No notable events documented.  Last Vitals:  Vitals:   10/20/21 1715 10/20/21 1730  BP: 128/64 (!) 119/94  Pulse: 78   Resp: (!) 28   Temp:    SpO2: 94%     Last Pain:  Vitals:   10/20/21 1730  TempSrc:   PainSc: [place]

## 2021-10-20 NOTE — Progress Notes (Signed)
Due to shortness of breath after pre-operative Exparel block, patient will be kept overnight in observation. Covid test ordered.   Merlene Pulling, PA-C

## 2021-10-20 NOTE — Interval H&P Note (Signed)
History and Physical Interval Note:  10/20/2021 12:42 PM  Deborah Cooke  has presented today for surgery, with the diagnosis of right shoulder DJD.  The various methods of treatment have been discussed with the patient and family. After consideration of risks, benefits and other options for treatment, the patient has consented to  Procedure(s): REVERSE SHOULDER ARTHROPLASTY (Right) vs. Hemiarthroplasty depending on glenoid bone stock as a surgical intervention.  The patient's history has been reviewed, patient examined, no change in status, stable for surgery.  I have reviewed the patient's chart and labs.  Questions were answered to the patient's satisfaction.     [name]

## 2021-10-20 NOTE — Discharge Instructions (Signed)
Diet: As you were doing prior to hospitalization   Shower:  May shower but keep the wounds dry, use an occlusive plastic wrap, NO SOAKING IN TUB.  If the bandage gets wet, change with a clean dry gauze.  If you have a splint on, leave the splint in place and keep the splint dry with a plastic bag.  Dressing:  You may change your dressing 3-5 days after surgery, unless you have a splint.  If you have a splint, then just leave the splint in place and we will change your bandages during your first follow-up appointment.    If you had hand or foot surgery, we will plan to remove your stitches in about 2 weeks in the office.  For all other surgeries, there are sticky tapes (steri-strips) on your wounds and all the stitches are absorbable.  Leave the steri-strips in place when changing your dressings, they will peel off with time, usually 2-3 weeks.  Activity:  Increase activity slowly as tolerated, but follow the weight bearing instructions below.  The rules on driving is that you can not be taking narcotics while you drive, and you must feel in control of the vehicle.    Weight Bearing:   No bearing weight with right arm  To prevent constipation: you may use a stool softener such as -  Colace (over the counter) 100 mg by mouth twice a day  Drink plenty of fluids (prune juice may be helpful) and high fiber foods Miralax (over the counter) for constipation as needed.    Itching:  If you experience itching with your medications, try taking only a single pain pill, or even half a pain pill at a time.  You may take up to 10 pain pills per day, and you can also use benadryl over the counter for itching or also to help with sleep.   Precautions:  If you experience chest pain or shortness of breath - call 911 immediately for transfer to the hospital emergency department!!  If you develop a fever greater that 101 F, purulent drainage from wound, increased redness or drainage from wound, or calf pain -- Call  the office at [phone]                                                Follow- Up Appointment:  Please call for an appointment to be seen in 2 weeks [place] - [phone]

## 2021-10-20 NOTE — Plan of Care (Signed)
°  Problem: Education: Goal: Knowledge of the prescribed therapeutic regimen will improve Outcome: Progressing   Problem: Activity: Goal: Ability to tolerate increased activity will improve Outcome: Progressing   Problem: Pain Management: Goal: Pain level will decrease with appropriate interventions Outcome: Progressing   

## 2021-10-20 NOTE — Transfer of Care (Signed)
Immediate Anesthesia Transfer of Care Note  Patient: Deborah Cooke  Procedure(s) Performed: REVERSE SHOULDER ARTHROPLASTY (Right: Shoulder)  Patient Location: PACU  Anesthesia Type:GA combined with regional for post-op pain  Level of Consciousness: awake  Airway & Oxygen Therapy: Patient Spontanous Breathing and Patient connected to face mask oxygen  Post-op Assessment: Report given to RN and Post -op Vital signs reviewed and stable  Post vital signs: Reviewed and stable  Last Vitals:  Vitals Value Taken Time  BP 162/108 10/20/21 1638  Temp    Pulse 71 10/20/21 1642  Resp 20 10/20/21 1642  SpO2 97 % 10/20/21 1642  Vitals shown include unvalidated device data.  Last Pain:  Vitals:   10/20/21 1214  TempSrc:   PainSc: 6       Patients Stated Pain Goal: 4 (41/14/64 3142)  Complications: No notable events documented.

## 2021-10-20 NOTE — Progress Notes (Signed)
Occupational Therapy Evaluation Patient Details Name: Deborah Cooke MRN: [id number] DOB: May 21, 1951 Today's Date: 10/20/2021   History of Present Illness Patient is a 71 year old female admitted for right reverse total shoulder arthroplasty.   Clinical Impression   Patient is a 71 year old female admitted for shoulder replacement resulting in decreased functional use of right dominant upper extremity secondary to effects of surgery, interscalene block and shoulder precautions. Therapist provided education and instruction to patient and spouse in regards to exercises, precautions, positioning, donning upper extremity clothing and bathing while maintaining shoulder precautions, ice and edema management and donning/doffing sling. Patient and spouse verbalized understanding. Cooke follow up with patient during acute hospitalization as needed, patient to follow up with MD for further therapy needs at D/C.        Recommendations for follow up therapy are one component of a multi-disciplinary discharge planning process, led by the attending physician.  Recommendations may be updated based on patient status, additional functional criteria and insurance authorization.   Follow Up Recommendations  Follow physician's recommendations for discharge plan and follow up therapies    Assistance Recommended at Discharge Intermittent Supervision/Assistance  Patient can return home with the following A little help with bathing/dressing/bathroom;Assistance with cooking/housework       Equipment Recommendations  None recommended by OT       Precautions / Restrictions Precautions Precautions: Shoulder Type of Shoulder Precautions: A/PROM shoulder NO, AROM elbow wrist and hand ok. Shoulder Interventions: Shoulder sling/immobilizer;Off for dressing/bathing/exercises Precaution Booklet Issued: Yes (comment) Required Braces or Orthoses: Sling Restrictions Weight Bearing Restrictions: Yes RUE Weight Bearing:  Non weight bearing             ADL either performed or assessed with clinical judgement   ADL                                         General ADL Comments: Patient and spouse educated in shoulder precautions and how to maintain during self care tasks. Verbalized understanding.      Pertinent Vitals/Pain Pain Assessment Pain Assessment: Faces Faces Pain Scale: Hurts a little bit Pain Location: R UE Pain Descriptors / Indicators: Grimacing Pain Intervention(s): Monitored during session     Hand Dominance Right   Extremity/Trunk Assessment Upper Extremity Assessment Upper Extremity Assessment: RUE deficits/detail RUE: Unable to fully assess due to immobilization   Lower Extremity Assessment Lower Extremity Assessment: Overall WFL for tasks assessed       Communication Communication Communication: No difficulties   Cognition Arousal/Alertness: Awake/alert Behavior During Therapy: WFL for tasks assessed/performed Overall Cognitive Status: Within Functional Limits for tasks assessed                                          Exercises Exercises: Shoulder   Shoulder Instructions Shoulder Instructions Donning/doffing shirt without moving shoulder: Caregiver independent with task;Patient able to independently direct caregiver Method for sponge bathing under operated UE: Caregiver independent with task;Patient able to independently direct caregiver Donning/doffing sling/immobilizer: Caregiver independent with task;Patient able to independently direct caregiver Correct positioning of sling/immobilizer: Caregiver independent with task;Patient able to independently direct caregiver Pendulum exercises (written home exercise program):  (N/A) ROM for elbow, wrist and digits of operated UE: Caregiver independent with task;Patient able to independently direct caregiver Sling wearing schedule (  on at all times/off for ADL's): Caregiver independent  with task;Patient able to independently direct caregiver Proper positioning of operated UE when showering: Caregiver independent with task;Patient able to independently direct caregiver Dressing change:  (N/A) Positioning of UE while sleeping: Caregiver independent with task;Patient able to independently direct caregiver    Home Living Family/patient expects to be discharged to:: Private residence Living Arrangements: Spouse/significant other Available Help at Discharge: Family                                    Prior Functioning/Environment Prior Level of Function : Independent/Modified Independent                        OT Problem List: Pain;Impaired UE functional use;Decreased knowledge of precautions      OT Treatment/Interventions: Self-care/ADL training;Patient/family education;Therapeutic exercise;Therapeutic activities    OT Goals(Current goals can be found in the care plan section) Acute Rehab OT Goals Patient Stated Goal: home with spouse OT Goal Formulation: With patient Time For Goal Achievement: 11/03/21 Potential to Achieve Goals: Good  OT Frequency: Min 2X/week       AM-PAC OT "6 Clicks" Daily Activity     Outcome Measure Help from another person eating meals?: A Little Help from another person taking care of personal grooming?: A Little Help from another person toileting, which includes using toliet, bedpan, or urinal?: A Little Help from another person bathing (including washing, rinsing, drying)?: A Little Help from another person to put on and taking off regular upper body clothing?: A Lot Help from another person to put on and taking off regular lower body clothing?: A Little 6 Click Score: 17   End of Session Nurse Communication: Other (comment) (OT complete)  Activity Tolerance: Patient tolerated treatment well Patient left: in bed;with call bell/phone within reach  OT Visit Diagnosis: Pain Pain - Right/Left: Right Pain -  part of body: Shoulder                Time: 4627-0350 OT Time Calculation (min): 19 min Charges:  OT General Charges $OT Visit: 1 Visit OT Evaluation $OT Eval Low Complexity: 1 Low  Delbert Phenix OT OT pager: [place] 10/20/2021, 1:58 PM

## 2021-10-21 ENCOUNTER — Encounter (HOSPITAL_COMMUNITY): Payer: Self-pay | Admitting: Orthopedic Surgery

## 2021-10-21 DIAGNOSIS — M19011 Primary osteoarthritis, right shoulder: Secondary | ICD-10-CM | POA: Diagnosis not present

## 2021-10-21 LAB — BASIC METABOLIC PANEL
Anion gap: 8 (ref 5–15)
BUN: 17 mg/dL (ref 8–23)
CO2: 24 mmol/L (ref 22–32)
Calcium: 8.4 mg/dL — ABNORMAL LOW (ref 8.9–10.3)
Chloride: 97 mmol/L — ABNORMAL LOW (ref 98–111)
Creatinine, Ser: 0.57 mg/dL (ref 0.44–1.00)
GFR, Estimated: >60 mL/min (ref >60)
Glucose, Bld: 134 mg/dL — ABNORMAL HIGH (ref 70–99)
Potassium: 4.8 mmol/L (ref 3.5–5.1)
Sodium: 129 mmol/L — ABNORMAL LOW (ref 135–145)

## 2021-10-21 LAB — CBC
HCT: 33.4 % — ABNORMAL LOW (ref 36.0–46.0)
Hemoglobin: 11 g/dL — ABNORMAL LOW (ref 12.0–15.0)
MCH: 31 pg (ref 26.0–34.0)
MCHC: 32.9 g/dL (ref 30.0–36.0)
MCV: 94.1 fL (ref 80.0–100.0)
Platelets: 294 K/uL (ref 150–400)
RBC: 3.55 MIL/uL — ABNORMAL LOW (ref 3.87–5.11)
RDW: 12.7 % (ref 11.5–15.5)
WBC: 8.5 K/uL (ref 4.0–10.5)
nRBC: 0 % (ref 0.0–0.2)

## 2021-10-21 NOTE — Progress Notes (Signed)
Occupational Therapy Treatment Patient Details Name: Deborah Cooke MRN: [id number] DOB: 1951/02/20 Today's Date: 10/21/2021   History of present illness Patient is a 71 year old female admitted for right reverse total shoulder arthroplasty.   OT comments  RN reports patient desatting with activity and requests therapist to assess oxygen needs. Patient and spouse verbalize retained information in regards to shoulder precautions and sling schedule. Patient had questions in regards to showering and bandage. Patient found on RA and o2 sat between 90-91%. Patient dropped to 88% with transfer to side of bed. Performed to bouts of ambulation to assess oxygen desaturation. Patient performed 8 feet ambulation and o2 sat dropped to 86%. Performed 20 feet ambulation and o2 sat dropped to 83%. From an OT stand point patient had met OT goals and patient and spouse are comfortable with shoulder instructions. Patient may need oxygen on return home. Defer to nursing for further oxygen assessment.   Recommendations for follow up therapy are one component of a multi-disciplinary discharge planning process, led by the attending physician.  Recommendations may be updated based on patient status, additional functional criteria and insurance authorization.    Follow Up Recommendations  Follow physician's recommendations for discharge plan and follow up therapies    Assistance Recommended at Discharge Intermittent Supervision/Assistance  Patient can return home with the following  A little help with bathing/dressing/bathroom;Assistance with cooking/housework   Equipment Recommendations  None recommended by OT    Recommendations for Other Services      Precautions / Restrictions Precautions Precautions: Shoulder Type of Shoulder Precautions: A/PROM shoulder NO, AROM elbow wrist and hand ok. Shoulder Interventions: Shoulder sling/immobilizer;Off for dressing/bathing/exercises Precaution Booklet Issued: Yes  (comment) Required Braces or Orthoses: Sling Restrictions Weight Bearing Restrictions: Yes RUE Weight Bearing: Non weight bearing       Mobility Bed Mobility Overal bed mobility: Needs Assistance Bed Mobility: Supine to Sit     Supine to sit: HOB elevated     General bed mobility comments: supervison - reports typically sleeping in recilner    Transfers Overall transfer level: Needs assistance Equipment used: Straight cane Transfers: Sit to/from Stand, Bed to chair/wheelchair/BSC Sit to Stand: Min guard           General transfer comment: Min guard to ambulate with cane. patient ambulated 8 feet and o2 sat dropped to 86% and recoverd to 90%. Patient ambulated 20 feet and o2  sat dropped to 83%     Balance Overall balance assessment: Mild deficits observed, not formally tested                                             Cognition Arousal/Alertness: Awake/alert Behavior During Therapy: WFL for tasks assessed/performed Overall Cognitive Status: Within Functional Limits for tasks assessed                                                     Pertinent Vitals/ Pain       Pain Assessment Pain Assessment: No/denies pain   Frequency  Min 2X/week        Progress Toward Goals  OT Goals(current goals can now be found in the care plan section)  Progress towards OT goals: Goals met/education completed, patient discharged  from OT  Acute Rehab OT Goals Patient Stated Goal: home with spouse OT Goal Formulation: All assessment and education complete, DC therapy  Plan Discharge plan remains appropriate    Co-evaluation                 AM-PAC OT "6 Clicks" Daily Activity     Outcome Measure   Help from another person eating meals?: A Little Help from another person taking care of personal grooming?: A Little Help from another person toileting, which includes using toliet, bedpan, or urinal?: A Little Help from  another person bathing (including washing, rinsing, drying)?: A Little Help from another person to put on and taking off regular upper body clothing?: A Lot Help from another person to put on and taking off regular lower body clothing?: A Little 6 Click Score: 17    End of Session Equipment Utilized During Treatment: Gait belt  OT Visit Diagnosis: Pain Pain - Right/Left: Right Pain - part of body: Shoulder   Activity Tolerance Patient tolerated treatment well   Patient Left in chair;with call bell/phone within reach;with family/visitor present   Nurse Communication Mobility status (o2 sats)        Time: 4360-6770 OT Time Calculation (min): 24 min  Charges: OT General Charges $OT Visit: 1 Visit OT Treatments $Therapeutic Activity: 8-22 mins  Guy Toney, OTR/L [place]  Office [phone] Pager: [place] 10/21/2021, 12:48 PM

## 2021-10-21 NOTE — Progress Notes (Signed)
°  Transition of Care [facility]) Screening Note   Patient Details  Name: SABEEN PIECHOCKI Date of Birth: November 14, 1950   Transition of Care [facility]) CM/SW Contact:    Lennart Pall, LCSW Phone Number: 10/21/2021, 12:11 PM    Transition of Care Department [facility]) has reviewed patient and no TOC needs have been identified at this time. We will continue to monitor patient advancement through interdisciplinary progression rounds. If new patient transition needs arise, please place a TOC consult.

## 2021-10-21 NOTE — Plan of Care (Signed)
°  Problem: Education: Goal: Knowledge of the prescribed therapeutic regimen will improve Outcome: Progressing   Problem: Activity: Goal: Ability to tolerate increased activity will improve Outcome: Progressing   Problem: Pain Management: Goal: Pain level will decrease with appropriate interventions Outcome: Progressing   

## 2021-10-21 NOTE — Progress Notes (Signed)
Provided discharge education/instructions, all questions and concerns addressed. Pt not in acute distress but complaining of 8/10 pain to R shoulder, PRN pain med given. Pt denies palpitations, no chest pain or SOB stating she is ready to go home and feels fine. Pt discharged home with belongings accompanied by husband.

## 2021-10-21 NOTE — Progress Notes (Signed)
Subjective: 1 Day Post-Op s/p Procedure(s): REVERSE SHOULDER ARTHROPLASTY   Patient is alert, oriented. States nerve block is starting to wear off, pain moderate-severe this morning.  Denies chest pain, SOB, Calf pain. No nausea/vomiting. No other complaints.   Objective:  PE: VITALS:   Vitals:   10/20/21 1835 10/20/21 2034 10/21/21 0129 10/21/21 0553  BP: 127/77 139/73 (!) 149/82 (!) 174/94  Pulse: 73 82 92 91  Resp: 15 17 17 16   Temp: 98.7 F (37.1 C) 98.4 F (36.9 C) 98.6 F (37 C) 98.6 F (37 C)  TempSrc: Oral Oral Oral Oral  SpO2: 94% 96% 97% 99%  Weight:      Height:       General: sitting up in bed, in no acute distress Resp: nasal canula in place, normal respiratory effort MSK: right arm in sling. Dressing with scant drainage. Able to flex, extend, and abduct all fingers. Sensation intact to all fingers. Sensation intact to axillary nerve distribution.    LABS  Results for orders placed or performed during the hospital encounter of 10/20/21 (from the past 24 hour(s))  Basic metabolic panel     Status: Abnormal   Collection Time: 10/20/21 12:02 PM  Result Value Ref Range   Sodium 132 (L) 135 - 145 mmol/L   Potassium 4.1 3.5 - 5.1 mmol/L   Chloride 98 98 - 111 mmol/L   CO2 27 22 - 32 mmol/L   Glucose, Bld 101 (H) 70 - 99 mg/dL   BUN 20 8 - 23 mg/dL   Creatinine, Ser 0.56 0.44 - 1.00 mg/dL   Calcium 8.8 (L) 8.9 - 10.3 mg/dL   GFR, Estimated >60 >60 mL/min   Anion gap 7 5 - 15  SARS Coronavirus 2 by RT PCR (hospital order, performed in [place] hospital lab) Nasopharyngeal Nasopharyngeal Swab     Status: None   Collection Time: 10/20/21  4:49 PM   Specimen: Nasopharyngeal Swab  Result Value Ref Range   SARS Coronavirus 2 NEGATIVE NEGATIVE  CBC     Status: Abnormal   Collection Time: 10/21/21  3:09 AM  Result Value Ref Range   WBC 8.5 4.0 - 10.5 K/uL   RBC 3.55 (L) 3.87 - 5.11 MIL/uL   Hemoglobin 11.0 (L) 12.0 - 15.0 g/dL   HCT 33.4 (L) 36.0 -  46.0 %   MCV 94.1 80.0 - 100.0 fL   MCH 31.0 26.0 - 34.0 pg   MCHC 32.9 30.0 - 36.0 g/dL   RDW 12.7 11.5 - 15.5 %   Platelets 294 150 - 400 K/uL   nRBC 0.0 0.0 - 0.2 %  Basic metabolic panel     Status: Abnormal   Collection Time: 10/21/21  3:09 AM  Result Value Ref Range   Sodium 129 (L) 135 - 145 mmol/L   Potassium 4.8 3.5 - 5.1 mmol/L   Chloride 97 (L) 98 - 111 mmol/L   CO2 24 22 - 32 mmol/L   Glucose, Bld 134 (H) 70 - 99 mg/dL   BUN 17 8 - 23 mg/dL   Creatinine, Ser 0.57 0.44 - 1.00 mg/dL   Calcium 8.4 (L) 8.9 - 10.3 mg/dL   GFR, Estimated >60 >60 mL/min   Anion gap 8 5 - 15    DG Shoulder Right Port  Result Date: 10/20/2021 CLINICAL DATA:  Right shoulder arthroplasty EXAM: RIGHT SHOULDER - 1 VIEW COMPARISON:  None. FINDINGS: Frontal view of the right shoulder demonstrates right shoulder arthroplasty in the expected  position without signs of acute complication. Postsurgical changes are seen in the overlying soft tissues. The right chest is clear. IMPRESSION: 1. Right shoulder arthroplasty. Electronically Signed   By: Randa Ngo M.D.   On: 10/20/2021 17:21    Assessment/Plan: Principal Problem:   Rotator cuff arthropathy of right shoulder Active Problems:   S/P reverse total shoulder arthroplasty, right  1 Day Post-Op s/p Procedure(s): REVERSE SHOULDER ARTHROPLASTY  Weightbearing: NWB RUE Insicional and dressing care: Reinforce dressings as needed Orthopedic device(s): continue use of ice machine, discharge patient home with ice machine Pain control: home dose percocet 4 x a day with dilaudid for prn pain control. Continue current regimen. Follow - up plan: 2 weeks with Dr. Mardelle Matte  Will plan to d/c nasal canula this morning. If patient able to maintain O2 saturation without nasal canula. Ok to discharge home.    Contact information:   Weekdays 8-5 [address], [place] [phone] A fter hours and holidays please check [email or web address] for group call information for  [facility]  Ventura Bruns 10/21/2021, 8:24 AM

## 2021-10-21 NOTE — Discharge Summary (Signed)
Physician Discharge Summary  Patient ID: Deborah Cooke MRN: [id number] DOB/AGE: 1951-09-02 70 y.o.  Admit date: 10/20/2021 Discharge date: 10/21/2021  Admission Diagnoses:  Rotator cuff arthropathy of right shoulder  Discharge Diagnoses:  Principal Problem:   Rotator cuff arthropathy of right shoulder Active Problems:   S/P reverse total shoulder arthroplasty, right   Past Medical History:  Diagnosis Date   Adverse effect of anesthetic 01/2012   difficulty waking up.   Adverse effect of unspecified anesthetic, initial encounter 08/2011   low o2 sats.   Anemia    Anxiety    Arthritis    Breast cancer ([place]) 02/16/2017   left breast   Chronic pain    Closed supracondylar fracture of right femur ([place]) 09/18/2012   Depression    Family history of breast cancer    GERD (gastroesophageal reflux disease)    pt denies   Headache(784.0)    Hypertension    Hypothyroidism    Opioid dependence in controlled environment ([place])    Osteoporosis    Osteoporosis with fracture 09/19/2012   Pneumonia    Thyroid disease     Surgeries: Procedure(s): REVERSE SHOULDER ARTHROPLASTY on 10/20/2021   Consultants (if any):   Discharged Condition: Improved  Hospital Course: Deborah Cooke is an 71 y.o. female who was admitted 10/20/2021 with a diagnosis of Rotator cuff arthropathy of right shoulder and went to the operating room on 10/20/2021 and underwent the above named procedures.    She was given perioperative antibiotics:  Anti-infectives (From admission, onward)    Start     Dose/Rate Route Frequency Ordered Stop   10/20/21 2000  ceFAZolin (ANCEF) IVPB 2g/100 mL premix        2 g 200 mL/hr over 30 Minutes Intravenous Every 6 hours 10/20/21 1828 10/21/21 1359   10/20/21 1115  ceFAZolin (ANCEF) IVPB 2g/100 mL premix        2 g 200 mL/hr over 30 Minutes Intravenous On call to O.R. 10/20/21 1105 10/20/21 1411     .  She was given sequential compression devices, early ambulation for DVT  prophylaxis. In the preoperative area she was short of breath after receiving her Exparel block, which is a known possible side effect of an intrascalene block. 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 Post-op Day 1 she was feeling improved and resting O2 saturations were back to her baseline 94%. She was discharged home.    Recent vital signs:  Vitals:   10/21/21 1618 10/21/21 1620  BP: (!) 180/95 (!) 175/95  Pulse: 73   Resp: 18   Temp: 99.1 F (37.3 C)   SpO2: 94%     Recent laboratory studies:  Lab Results  Component Value Date   HGB 11.0 (L) 10/21/2021   HGB 12.2 10/13/2021   HGB 12.2 10/07/2021   Lab Results  Component Value Date   WBC 8.5 10/21/2021   PLT 294 10/21/2021   No results found for: INR Lab Results  Component Value Date   NA 129 (L) 10/21/2021   K 4.8 10/21/2021   CL 97 (L) 10/21/2021   CO2 24 10/21/2021   BUN 17 10/21/2021   CREATININE 0.57 10/21/2021   GLUCOSE 134 (H) 10/21/2021    Discharge Medications:   Allergies as of 10/21/2021       Reactions   Nsaids Nausea And Vomiting, Other (See Comments)   GI Upset (ibuprofen included)   Ibuprofen Other (See Comments)   Other reaction(s): Abdominal  Pain   Tolmetin Other (See Comments)   Other reaction(s): Abdominal Pain        Medication List     STOP taking these medications    acetaminophen 325 MG tablet Commonly known as: TYLENOL   acetaminophen 500 MG tablet Commonly known as: TYLENOL   enoxaparin 40 MG/0.4ML injection Commonly known as: LOVENOX   feeding supplement Liqd       TAKE these medications    acidophilus Caps capsule Take 1 capsule by mouth daily.   anastrozole 1 MG tablet Commonly known as: ARIMIDEX TAKE 1 TABLET BY MOUTH  DAILY   atenolol 25 MG tablet Commonly known as: TENORMIN Take 25 mg by mouth daily.   B COMPLEX 100 PO Take 1 tablet by mouth daily. Notes to patient: Resume home regimen   Biotin 10000 MCG Tabs Take  10,000 mcg by mouth daily. Notes to patient: Resume home regimen   buPROPion 300 MG 24 hr tablet Commonly known as: WELLBUTRIN XL Take 300 mg by mouth daily.   CALCIUM 600 PO Take 600 mg by mouth in the morning and at bedtime. Notes to patient: Resume home regimen   carbamazepine 200 MG 12 hr tablet Commonly known as: TEGRETOL XR Take 400 mg by mouth 2 (two) times daily. Notes to patient: Resume home regimen   cholecalciferol 10 MCG (400 UNIT) Tabs tablet Commonly known as: VITAMIN D3 Take 400 Units by mouth daily. Notes to patient: Resume home regimen   cyclobenzaprine 10 MG tablet Commonly known as: FLEXERIL Take 1 tablet (10 mg total) by mouth 3 (three) times daily as needed for muscle spasms. Notes to patient: Resume home regimen   diphenhydramine-acetaminophen 25-500 MG Tabs tablet Commonly known as: TYLENOL PM Take 2 tablets by mouth at bedtime. Notes to patient: Resume home regimen   DULoxetine 30 MG capsule Commonly known as: CYMBALTA Take 30 mg by mouth 2 (two) times daily.   ferrous sulfate 325 (65 FE) MG EC tablet Take 325 mg by mouth daily with breakfast.   furosemide 20 MG tablet Commonly known as: LASIX Take 20 mg by mouth daily as needed for fluid. Notes to patient: Resume home regimen   gabapentin 800 MG tablet Commonly known as: NEURONTIN Take 800 mg by mouth 4 (four) times daily. What changed: Another medication with the same name was removed. Continue taking this medication, and follow the directions you see here.   HYDROmorphone 2 MG tablet Commonly known as: Dilaudid Take 1 tablet (2 mg total) by mouth every 4 (four) hours as needed for severe pain. Notes to patient: Last dose given 02/01 09:42am   hydrOXYzine 50 MG tablet Commonly known as: ATARAX Take 50 mg by mouth daily.   latanoprost 0.005 % ophthalmic solution Commonly known as: XALATAN Place 1 drop into both eyes at bedtime.   levothyroxine 88 MCG tablet Commonly known as:  SYNTHROID Take 88 mcg by mouth daily.   liothyronine 25 MCG tablet Commonly known as: CYTOMEL Take 25 mcg by mouth daily.   lisinopril 20 MG tablet Commonly known as: ZESTRIL Take 20 mg by mouth daily.   MULTIVITAMIN/IRON PO Take 1 tablet by mouth daily. Notes to patient: Resume home regimen   omeprazole 20 MG capsule Commonly known as: PRILOSEC Take 20 mg by mouth daily.   ondansetron 4 MG tablet Commonly known as: Zofran Take 1 tablet (4 mg total) by mouth every 8 (eight) hours as needed for nausea or vomiting. Notes to patient: Last dose given 01/31 03:39pm  oxyCODONE-acetaminophen 10-325 MG tablet Commonly known as: PERCOCET Take 1 tablet by mouth in the morning, at noon, in the evening, and at bedtime.   polyethylene glycol 17 g packet Commonly known as: MIRALAX / GLYCOLAX Take 17 g by mouth 2 (two) times daily. What changed: when to take this Notes to patient: Resume home regimen   ProAir HFA 108 (90 Base) MCG/ACT inhaler Generic drug: albuterol Inhale 2 puffs into the lungs every 4 (four) hours as needed for shortness of breath. Notes to patient: Resume home regimen   QUEtiapine 400 MG 24 hr tablet Commonly known as: SEROQUEL XR Take 400 mg by mouth at bedtime.   simethicone 80 MG chewable tablet Commonly known as: MYLICON Chew 80 mg by mouth every 6 (six) hours as needed for flatulence. Notes to patient: Resume home regimen        Diagnostic Studies: DG Shoulder Right Port  Result Date: 10/20/2021 CLINICAL DATA:  Right shoulder arthroplasty EXAM: RIGHT SHOULDER - 1 VIEW COMPARISON:  None. FINDINGS: Frontal view of the right shoulder demonstrates right shoulder arthroplasty in the expected position without signs of acute complication. Postsurgical changes are seen in the overlying soft tissues. The right chest is clear. IMPRESSION: 1. Right shoulder arthroplasty. Electronically Signed   By: Randa Ngo M.D.   On: 10/20/2021 17:21    Disposition:  Discharge disposition: 01-Home or Self Care          Follow-up Information     Marchia Bond, MD. Schedule an appointment as soon as possible for a visit in 2 week(s).   Specialty: Orthopedic Surgery Contact information: [address] [place] [address] [phone]                  Signed: Ventura Bruns 10/21/2021, 5:11 PM

## 2021-10-26 ENCOUNTER — Inpatient Hospital Stay (HOSPITAL_COMMUNITY)
Admission: EM | Admit: 2021-10-26 | Discharge: 2021-11-11 | DRG: 231 | Disposition: A | Discharge status: Skilled Nursing Facility | Payer: Medicare Other | Attending: Cardiothoracic Surgery | Admitting: Cardiothoracic Surgery

## 2021-10-26 ENCOUNTER — Encounter (HOSPITAL_COMMUNITY)
Admission: EM | Disposition: A | Discharge status: Skilled Nursing Facility | Payer: Self-pay | Source: Home / Self Care | Attending: Cardiothoracic Surgery

## 2021-10-26 ENCOUNTER — Inpatient Hospital Stay (HOSPITAL_COMMUNITY): Payer: Medicare Other

## 2021-10-26 DIAGNOSIS — E162 Hypoglycemia, unspecified: Secondary | ICD-10-CM | POA: Diagnosis not present

## 2021-10-26 DIAGNOSIS — E039 Hypothyroidism, unspecified: Secondary | ICD-10-CM | POA: Diagnosis present

## 2021-10-26 DIAGNOSIS — Z96653 Presence of artificial knee joint, bilateral: Secondary | ICD-10-CM | POA: Diagnosis present

## 2021-10-26 DIAGNOSIS — Z9911 Dependence on respirator [ventilator] status: Secondary | ICD-10-CM | POA: Diagnosis not present

## 2021-10-26 DIAGNOSIS — Z886 Allergy status to analgesic agent status: Secondary | ICD-10-CM

## 2021-10-26 DIAGNOSIS — R601 Generalized edema: Secondary | ICD-10-CM | POA: Diagnosis not present

## 2021-10-26 DIAGNOSIS — E669 Obesity, unspecified: Secondary | ICD-10-CM | POA: Diagnosis not present

## 2021-10-26 DIAGNOSIS — Z96611 Presence of right artificial shoulder joint: Secondary | ICD-10-CM | POA: Diagnosis present

## 2021-10-26 DIAGNOSIS — I2510 Atherosclerotic heart disease of native coronary artery without angina pectoris: Secondary | ICD-10-CM | POA: Diagnosis present

## 2021-10-26 DIAGNOSIS — Z09 Encounter for follow-up examination after completed treatment for conditions other than malignant neoplasm: Secondary | ICD-10-CM

## 2021-10-26 DIAGNOSIS — Z9889 Other specified postprocedural states: Secondary | ICD-10-CM

## 2021-10-26 DIAGNOSIS — Z4659 Encounter for fitting and adjustment of other gastrointestinal appliance and device: Secondary | ICD-10-CM

## 2021-10-26 DIAGNOSIS — Z9049 Acquired absence of other specified parts of digestive tract: Secondary | ICD-10-CM

## 2021-10-26 DIAGNOSIS — I442 Atrioventricular block, complete: Secondary | ICD-10-CM | POA: Diagnosis present

## 2021-10-26 DIAGNOSIS — Z951 Presence of aortocoronary bypass graft: Secondary | ICD-10-CM

## 2021-10-26 DIAGNOSIS — Z79890 Hormone replacement therapy (postmenopausal): Secondary | ICD-10-CM

## 2021-10-26 DIAGNOSIS — M810 Age-related osteoporosis without current pathological fracture: Secondary | ICD-10-CM | POA: Diagnosis present

## 2021-10-26 DIAGNOSIS — I4901 Ventricular fibrillation: Secondary | ICD-10-CM | POA: Diagnosis not present

## 2021-10-26 DIAGNOSIS — F419 Anxiety disorder, unspecified: Secondary | ICD-10-CM | POA: Diagnosis present

## 2021-10-26 DIAGNOSIS — I10 Essential (primary) hypertension: Secondary | ICD-10-CM | POA: Diagnosis present

## 2021-10-26 DIAGNOSIS — I252 Old myocardial infarction: Secondary | ICD-10-CM

## 2021-10-26 DIAGNOSIS — R0602 Shortness of breath: Secondary | ICD-10-CM

## 2021-10-26 DIAGNOSIS — K567 Ileus, unspecified: Secondary | ICD-10-CM | POA: Diagnosis not present

## 2021-10-26 DIAGNOSIS — G8929 Other chronic pain: Secondary | ICD-10-CM | POA: Diagnosis present

## 2021-10-26 DIAGNOSIS — W19XXXA Unspecified fall, initial encounter: Secondary | ICD-10-CM | POA: Diagnosis present

## 2021-10-26 DIAGNOSIS — Z0189 Encounter for other specified special examinations: Secondary | ICD-10-CM

## 2021-10-26 DIAGNOSIS — Z79899 Other long term (current) drug therapy: Secondary | ICD-10-CM | POA: Diagnosis not present

## 2021-10-26 DIAGNOSIS — Z87891 Personal history of nicotine dependence: Secondary | ICD-10-CM

## 2021-10-26 DIAGNOSIS — Z888 Allergy status to other drugs, medicaments and biological substances status: Secondary | ICD-10-CM

## 2021-10-26 DIAGNOSIS — D62 Acute posthemorrhagic anemia: Secondary | ICD-10-CM | POA: Diagnosis not present

## 2021-10-26 DIAGNOSIS — Z803 Family history of malignant neoplasm of breast: Secondary | ICD-10-CM | POA: Diagnosis not present

## 2021-10-26 DIAGNOSIS — E8770 Fluid overload, unspecified: Secondary | ICD-10-CM | POA: Diagnosis not present

## 2021-10-26 DIAGNOSIS — E440 Moderate protein-calorie malnutrition: Secondary | ICD-10-CM | POA: Diagnosis not present

## 2021-10-26 DIAGNOSIS — Z9884 Bariatric surgery status: Secondary | ICD-10-CM | POA: Diagnosis not present

## 2021-10-26 DIAGNOSIS — Y92009 Unspecified place in unspecified non-institutional (private) residence as the place of occurrence of the external cause: Secondary | ICD-10-CM

## 2021-10-26 DIAGNOSIS — Z20822 Contact with and (suspected) exposure to covid-19: Secondary | ICD-10-CM | POA: Diagnosis present

## 2021-10-26 DIAGNOSIS — E876 Hypokalemia: Secondary | ICD-10-CM | POA: Diagnosis not present

## 2021-10-26 DIAGNOSIS — R410 Disorientation, unspecified: Secondary | ICD-10-CM | POA: Diagnosis not present

## 2021-10-26 DIAGNOSIS — Z6841 Body Mass Index (BMI) 40.0 and over, adult: Secondary | ICD-10-CM

## 2021-10-26 DIAGNOSIS — I213 ST elevation (STEMI) myocardial infarction of unspecified site: Secondary | ICD-10-CM | POA: Diagnosis not present

## 2021-10-26 DIAGNOSIS — Z853 Personal history of malignant neoplasm of breast: Secondary | ICD-10-CM | POA: Diagnosis not present

## 2021-10-26 DIAGNOSIS — I2119 ST elevation (STEMI) myocardial infarction involving other coronary artery of inferior wall: Principal | ICD-10-CM | POA: Diagnosis present

## 2021-10-26 DIAGNOSIS — Z4509 Encounter for adjustment and management of other cardiac device: Secondary | ICD-10-CM

## 2021-10-26 DIAGNOSIS — Z8679 Personal history of other diseases of the circulatory system: Secondary | ICD-10-CM | POA: Diagnosis present

## 2021-10-26 HISTORY — PX: IABP INSERTION: CATH118242

## 2021-10-26 HISTORY — PX: TEMPORARY PACEMAKER: CATH118268

## 2021-10-26 HISTORY — PX: CORONARY/GRAFT ACUTE MI REVASCULARIZATION: CATH118305

## 2021-10-26 HISTORY — PX: LEFT HEART CATH AND CORONARY ANGIOGRAPHY: CATH118249

## 2021-10-26 LAB — POCT I-STAT 7, (LYTES, BLD GAS, ICA,H+H)
Acid-base deficit: 1 mmol/L (ref 0.0–2.0)
Bicarbonate: 24.6 mmol/L (ref 20.0–28.0)
Calcium, Ion: 1.1 mmol/L — ABNORMAL LOW (ref 1.15–1.40)
HCT: 30 % — ABNORMAL LOW (ref 36.0–46.0)
Hemoglobin: 10.2 g/dL — ABNORMAL LOW (ref 12.0–15.0)
O2 Saturation: 100 %
Patient temperature: 97.7
Potassium: 4.2 mmol/L (ref 3.5–5.1)
Sodium: 135 mmol/L (ref 135–145)
TCO2: 26 mmol/L (ref 22–32)
pCO2 arterial: 43.6 mmHg (ref 32.0–48.0)
pH, Arterial: 7.358 (ref 7.350–7.450)
pO2, Arterial: 351 mmHg — ABNORMAL HIGH (ref 83.0–108.0)

## 2021-10-26 LAB — COMPREHENSIVE METABOLIC PANEL
ALT: 115 U/L — ABNORMAL HIGH (ref 0–44)
AST: 204 U/L — ABNORMAL HIGH (ref 15–41)
Albumin: 3.2 g/dL — ABNORMAL LOW (ref 3.5–5.0)
Alkaline Phosphatase: 180 U/L — ABNORMAL HIGH (ref 38–126)
Anion gap: 12 (ref 5–15)
BUN: 57 mg/dL — ABNORMAL HIGH (ref 8–23)
CO2: 23 mmol/L (ref 22–32)
Calcium: 8.4 mg/dL — ABNORMAL LOW (ref 8.9–10.3)
Chloride: 99 mmol/L (ref 98–111)
Creatinine, Ser: 2.1 mg/dL — ABNORMAL HIGH (ref 0.44–1.00)
GFR, Estimated: 25 mL/min — ABNORMAL LOW (ref >60)
Glucose, Bld: 149 mg/dL — ABNORMAL HIGH (ref 70–99)
Potassium: 4.1 mmol/L (ref 3.5–5.1)
Sodium: 134 mmol/L — ABNORMAL LOW (ref 135–145)
Total Bilirubin: 0.4 mg/dL (ref 0.3–1.2)
Total Protein: 6.4 g/dL — ABNORMAL LOW (ref 6.5–8.1)

## 2021-10-26 LAB — CBC WITH DIFFERENTIAL/PLATELET
Abs Immature Granulocytes: 0.13 10*3/uL — ABNORMAL HIGH (ref 0.00–0.07)
Basophils Absolute: 0 10*3/uL (ref 0.0–0.1)
Basophils Relative: 0 %
Eosinophils Absolute: 0 10*3/uL (ref 0.0–0.5)
Eosinophils Relative: 0 %
HCT: 31.6 % — ABNORMAL LOW (ref 36.0–46.0)
Hemoglobin: 10.4 g/dL — ABNORMAL LOW (ref 12.0–15.0)
Immature Granulocytes: 1 %
Lymphocytes Relative: 14 %
Lymphs Abs: 1.4 10*3/uL (ref 0.7–4.0)
MCH: 31.2 pg (ref 26.0–34.0)
MCHC: 32.9 g/dL (ref 30.0–36.0)
MCV: 94.9 fL (ref 80.0–100.0)
Monocytes Absolute: 0.7 10*3/uL (ref 0.1–1.0)
Monocytes Relative: 7 %
Neutro Abs: 7.5 10*3/uL (ref 1.7–7.7)
Neutrophils Relative %: 78 %
Platelets: 320 10*3/uL (ref 150–400)
RBC: 3.33 MIL/uL — ABNORMAL LOW (ref 3.87–5.11)
RDW: 13.2 % (ref 11.5–15.5)
WBC: 9.8 10*3/uL (ref 4.0–10.5)
nRBC: 0 % (ref 0.0–0.2)

## 2021-10-26 LAB — RESP PANEL BY RT-PCR (FLU A&B, COVID) ARPGX2
Influenza A by PCR: NEGATIVE
Influenza B by PCR: NEGATIVE
SARS Coronavirus 2 by RT PCR: NEGATIVE

## 2021-10-26 LAB — HEMOGLOBIN A1C
Hgb A1c MFr Bld: 5.3 % (ref 4.8–5.6)
Mean Plasma Glucose: 105.41 mg/dL

## 2021-10-26 LAB — LIPID PANEL
Cholesterol: 154 mg/dL (ref 0–200)
HDL: 55 mg/dL (ref >40)
LDL Cholesterol: 70 mg/dL (ref 0–99)
Total CHOL/HDL Ratio: 2.8 RATIO
Triglycerides: 145 mg/dL (ref <150)
VLDL: 29 mg/dL (ref 0–40)

## 2021-10-26 LAB — PROTIME-INR
INR: 2.2 — ABNORMAL HIGH (ref 0.8–1.2)
Prothrombin Time: 24.4 seconds — ABNORMAL HIGH (ref 11.4–15.2)

## 2021-10-26 LAB — APTT: aPTT: 73 seconds — ABNORMAL HIGH (ref 24–36)

## 2021-10-26 LAB — GLUCOSE, CAPILLARY: Glucose-Capillary: 113 mg/dL — ABNORMAL HIGH (ref 70–99)

## 2021-10-26 LAB — TROPONIN I (HIGH SENSITIVITY): Troponin I (High Sensitivity): >24000 ng/L (ref <18)

## 2021-10-26 SURGERY — CORONARY/GRAFT ACUTE MI REVASCULARIZATION
Anesthesia: LOCAL

## 2021-10-26 MED ORDER — AMIODARONE HCL IN DEXTROSE 360-4.14 MG/200ML-% IV SOLN: 30.0000 mg/h | INTRAVENOUS | Status: DC

## 2021-10-26 MED ORDER — IOHEXOL 350 MG/ML SOLN
INTRAVENOUS | Status: DC | PRN
  Administered 2021-10-26: 90 mL

## 2021-10-26 MED ORDER — DEXMEDETOMIDINE HCL IN NACL 400 MCG/100ML IV SOLN
0.1000 ug/kg/h | INTRAVENOUS | Status: AC
  Administered 2021-10-27: .5 ug/kg/h via INTRAVENOUS
  Filled 2021-10-26: qty 100

## 2021-10-26 MED ORDER — TEMAZEPAM 7.5 MG PO CAPS: 15.0000 mg | ORAL_CAPSULE | Freq: Once | ORAL | Status: DC | PRN

## 2021-10-26 MED ORDER — SODIUM CHLORIDE 0.9 % IV SOLN
250.0000 mL | INTRAVENOUS | Status: DC | PRN
  Administered 2021-10-26: 250 mL via INTRAVENOUS

## 2021-10-26 MED ORDER — INSULIN REGULAR(HUMAN) IN NACL 100-0.9 UT/100ML-% IV SOLN
INTRAVENOUS | Status: AC
  Administered 2021-10-27: 2.2 [IU]/h via INTRAVENOUS
  Filled 2021-10-26: qty 100

## 2021-10-26 MED ORDER — MAGNESIUM SULFATE 2 GM/50ML IV SOLN: 2.0000 g | Freq: Once | INTRAVENOUS | Status: AC

## 2021-10-26 MED ORDER — CEFAZOLIN SODIUM-DEXTROSE 2-4 GM/100ML-% IV SOLN
2.0000 g | INTRAVENOUS | Status: AC
  Administered 2021-10-27: 2 g via INTRAVENOUS
  Filled 2021-10-26: qty 100

## 2021-10-26 MED ORDER — MILRINONE LACTATE IN DEXTROSE 20-5 MG/100ML-% IV SOLN
0.3000 ug/kg/min | INTRAVENOUS | Status: AC
  Administered 2021-10-27: .25 ug/kg/min via INTRAVENOUS
  Filled 2021-10-26: qty 100

## 2021-10-26 MED ORDER — VANCOMYCIN HCL 1250 MG/250ML IV SOLN
1250.0000 mg | INTRAVENOUS | Status: AC
  Administered 2021-10-27: 1250 mg via INTRAVENOUS
  Filled 2021-10-26: qty 250

## 2021-10-26 MED ORDER — BIVALIRUDIN BOLUS VIA INFUSION - CUPID
INTRAVENOUS | Status: DC | PRN
  Administered 2021-10-26: 21 mg via INTRAVENOUS

## 2021-10-26 MED ORDER — SODIUM CHLORIDE 0.9 % IV SOLN
2.0000 ug/kg/min | INTRAVENOUS | Status: DC
  Administered 2021-10-26 – 2021-10-27 (×2): 2 ug/kg/min via INTRAVENOUS
  Filled 2021-10-26 (×2): qty 50

## 2021-10-26 MED ORDER — DIAZEPAM 5 MG PO TABS
5.0000 mg | ORAL_TABLET | Freq: Once | ORAL | Status: AC
  Administered 2021-10-27: 5 mg via ORAL
  Filled 2021-10-26: qty 1

## 2021-10-26 MED ORDER — PLASMA-LYTE A IV SOLN
INTRAVENOUS | Status: AC
  Administered 2021-10-27: 500 mL
  Filled 2021-10-26: qty 2.5

## 2021-10-26 MED ORDER — ALPRAZOLAM 0.25 MG PO TABS: 0.2500 mg | ORAL_TABLET | ORAL | Status: DC | PRN

## 2021-10-26 MED ORDER — MAGNESIUM SULFATE 50 % IJ SOLN
40.0000 meq | INTRAMUSCULAR | Status: DC
  Filled 2021-10-26: qty 9.85

## 2021-10-26 MED ORDER — TRANEXAMIC ACID (OHS) PUMP PRIME SOLUTION
2.0000 mg/kg | INTRAVENOUS | Status: DC
  Filled 2021-10-26: qty 2.18

## 2021-10-26 MED ORDER — AMIODARONE HCL IN DEXTROSE 360-4.14 MG/200ML-% IV SOLN
60.0000 mg/h | INTRAVENOUS | Status: DC
Start: 2021-10-27 — End: 2021-10-26
  Administered 2021-10-26: 60 mg/h via INTRAVENOUS

## 2021-10-26 MED ORDER — CHLORHEXIDINE GLUCONATE CLOTH 2 % EX PADS: 6.0000 | MEDICATED_PAD | Freq: Every day | CUTANEOUS | Status: DC

## 2021-10-26 MED ORDER — FENTANYL CITRATE (PF) 100 MCG/2ML IJ SOLN
INTRAMUSCULAR | Status: DC | PRN
  Administered 2021-10-26: 25 ug via INTRAVENOUS

## 2021-10-26 MED ORDER — ONDANSETRON HCL 4 MG/2ML IJ SOLN
4.0000 mg | Freq: Four times a day (QID) | INTRAMUSCULAR | Status: DC | PRN
  Administered 2021-10-27: 4 mg via INTRAVENOUS
  Filled 2021-10-26: qty 2

## 2021-10-26 MED ORDER — HEPARIN 30,000 UNITS/1000 ML (OHS) CELLSAVER SOLUTION
Status: DC
  Filled 2021-10-26 (×2): qty 1000

## 2021-10-26 MED ORDER — CHLORHEXIDINE GLUCONATE 0.12 % MT SOLN
15.0000 mL | Freq: Once | OROMUCOSAL | Status: AC
  Administered 2021-10-27: 15 mL via OROMUCOSAL
  Filled 2021-10-26: qty 15

## 2021-10-26 MED ORDER — AMIODARONE HCL IN DEXTROSE 360-4.14 MG/200ML-% IV SOLN
30.0000 mg/h | INTRAVENOUS | Status: DC
  Administered 2021-10-27 – 2021-11-03 (×16): 30 mg/h via INTRAVENOUS
  Filled 2021-10-26 (×17): qty 200

## 2021-10-26 MED ORDER — CHLORHEXIDINE GLUCONATE CLOTH 2 % EX PADS
6.0000 | MEDICATED_PAD | Freq: Once | CUTANEOUS | Status: AC
  Administered 2021-10-26: 6 via TOPICAL

## 2021-10-26 MED ORDER — PHENYLEPHRINE HCL-NACL 20-0.9 MG/250ML-% IV SOLN
30.0000 ug/min | INTRAVENOUS | Status: DC
  Filled 2021-10-26: qty 250

## 2021-10-26 MED ORDER — AMIODARONE HCL IN DEXTROSE 360-4.14 MG/200ML-% IV SOLN
60.0000 mg/h | INTRAVENOUS | Status: DC
  Administered 2021-10-26 – 2021-10-27 (×2): 60 mg/h via INTRAVENOUS
  Filled 2021-10-26: qty 200

## 2021-10-26 MED ORDER — MAGNESIUM SULFATE 2 GM/50ML IV SOLN
INTRAVENOUS | Status: AC
  Administered 2021-10-26: 2 g via INTRAVENOUS
  Filled 2021-10-26: qty 50

## 2021-10-26 MED ORDER — LIDOCAINE HCL (CARDIAC) PF 100 MG/5ML IV SOSY
100.0000 mg | PREFILLED_SYRINGE | Freq: Once | INTRAVENOUS | Status: AC
  Administered 2021-10-26: 100 mg via INTRAVENOUS

## 2021-10-26 MED ORDER — AMIODARONE HCL IN DEXTROSE 360-4.14 MG/200ML-% IV SOLN: 60.0000 mg/h | INTRAVENOUS | Status: DC

## 2021-10-26 MED ORDER — NITROGLYCERIN IN D5W 200-5 MCG/ML-% IV SOLN
2.0000 ug/min | INTRAVENOUS | Status: DC
  Filled 2021-10-26: qty 250

## 2021-10-26 MED ORDER — SODIUM CHLORIDE 0.9 % IV SOLN
INTRAVENOUS | Status: AC | PRN
  Administered 2021-10-26: 4 ug/kg/min via INTRAVENOUS

## 2021-10-26 MED ORDER — CEFAZOLIN SODIUM-DEXTROSE 2-4 GM/100ML-% IV SOLN
2.0000 g | INTRAVENOUS | Status: DC
  Filled 2021-10-26: qty 100

## 2021-10-26 MED ORDER — HEPARIN (PORCINE) IN NACL 1000-0.9 UT/500ML-% IV SOLN
INTRAVENOUS | Status: DC | PRN
  Administered 2021-10-26 (×2): 500 mL

## 2021-10-26 MED ORDER — METOPROLOL TARTRATE 12.5 MG HALF TABLET
12.5000 mg | ORAL_TABLET | Freq: Once | ORAL | Status: DC
  Filled 2021-10-26: qty 1

## 2021-10-26 MED ORDER — LABETALOL HCL 5 MG/ML IV SOLN: 10.0000 mg | INTRAVENOUS | Status: DC | PRN

## 2021-10-26 MED ORDER — HYDRALAZINE HCL 20 MG/ML IJ SOLN: 10.0000 mg | INTRAMUSCULAR | Status: DC | PRN

## 2021-10-26 MED ORDER — EPINEPHRINE HCL 5 MG/250ML IV SOLN IN NS
0.0000 ug/min | INTRAVENOUS | Status: AC
  Administered 2021-10-27: 2 ug/min via INTRAVENOUS
  Filled 2021-10-26: qty 250

## 2021-10-26 MED ORDER — AMIODARONE HCL IN DEXTROSE 360-4.14 MG/200ML-% IV SOLN
INTRAVENOUS | Status: AC
  Administered 2021-10-28: 30 mg/h via INTRAVENOUS
  Filled 2021-10-26: qty 200

## 2021-10-26 MED ORDER — SODIUM CHLORIDE 0.9% FLUSH
3.0000 mL | Freq: Two times a day (BID) | INTRAVENOUS | Status: DC
  Administered 2021-10-26: 3 mL via INTRAVENOUS

## 2021-10-26 MED ORDER — LIDOCAINE IN D5W 4-5 MG/ML-% IV SOLN
INTRAVENOUS | Status: AC
  Filled 2021-10-26: qty 500

## 2021-10-26 MED ORDER — ACETAMINOPHEN 325 MG PO TABS: 650.0000 mg | ORAL_TABLET | ORAL | Status: DC | PRN

## 2021-10-26 MED ORDER — SODIUM CHLORIDE 0.9% FLUSH: 3.0000 mL | INTRAVENOUS | Status: DC | PRN

## 2021-10-26 MED ORDER — AMIODARONE LOAD VIA INFUSION
150.0000 mg | Freq: Once | INTRAVENOUS | Status: DC
  Filled 2021-10-26: qty 83.34

## 2021-10-26 MED ORDER — SODIUM CHLORIDE 0.9 % IV SOLN
0.2500 mg/kg/h | INTRAVENOUS | Status: DC
  Administered 2021-10-26: 0.25 mg/kg/h via INTRAVENOUS
  Filled 2021-10-26 (×2): qty 250

## 2021-10-26 MED ORDER — POTASSIUM CHLORIDE 2 MEQ/ML IV SOLN
80.0000 meq | INTRAVENOUS | Status: DC
  Filled 2021-10-26: qty 40

## 2021-10-26 MED ORDER — TRANEXAMIC ACID (OHS) BOLUS VIA INFUSION
15.0000 mg/kg | INTRAVENOUS | Status: AC
  Administered 2021-10-27: 1635 mg via INTRAVENOUS
  Filled 2021-10-26: qty 1635

## 2021-10-26 MED ORDER — BISACODYL 5 MG PO TBEC: 5.0000 mg | DELAYED_RELEASE_TABLET | Freq: Once | ORAL | Status: DC

## 2021-10-26 MED ORDER — LIDOCAINE IN D5W 4-5 MG/ML-% IV SOLN: 2.0000 mg/min | INTRAVENOUS | Status: DC

## 2021-10-26 MED ORDER — MAGNESIUM SULFATE 4 GM/100ML IV SOLN
INTRAVENOUS | Status: AC
  Filled 2021-10-26: qty 100

## 2021-10-26 MED ORDER — TRANEXAMIC ACID 1000 MG/10ML IV SOLN
1.5000 mg/kg/h | INTRAVENOUS | Status: AC
  Administered 2021-10-27: 1.5 mg/kg/h via INTRAVENOUS
  Filled 2021-10-26: qty 25

## 2021-10-26 MED ORDER — NOREPINEPHRINE 4 MG/250ML-% IV SOLN
0.0000 ug/min | INTRAVENOUS | Status: DC
  Filled 2021-10-26 (×2): qty 250

## 2021-10-26 MED ORDER — CHLORHEXIDINE GLUCONATE CLOTH 2 % EX PADS
6.0000 | MEDICATED_PAD | Freq: Once | CUTANEOUS | Status: AC
Start: 2021-10-26 — End: 2021-10-26
  Administered 2021-10-26: 6 via TOPICAL

## 2021-10-26 SURGICAL SUPPLY — 16 items
BALLN IABP SENSA PLUS 7.5F 40C (BALLOONS) ×2
BALLOON IABP SENS PLUS 7.5F40C (BALLOONS) IMPLANT
CATH EXTRAC PRONTO 5.5F 138CM (CATHETERS) ×1 IMPLANT
CATH INFINITI 5FR JL4 (CATHETERS) ×1 IMPLANT
CATH S G BIP PACING (CATHETERS) ×1 IMPLANT
CATH VISTA GUIDE 6FR JR4 (CATHETERS) ×1 IMPLANT
GUIDEWIRE INQWIRE 1.5J.035X260 (WIRE) IMPLANT
INQWIRE 1.5J .035X260CM (WIRE) ×2
KIT ENCORE 26 ADVANTAGE (KITS) ×1 IMPLANT
KIT HEART LEFT (KITS) ×2 IMPLANT
PACK CARDIAC CATHETERIZATION (CUSTOM PROCEDURE TRAY) ×2 IMPLANT
SHEATH PINNACLE 6F 10CM (SHEATH) ×2 IMPLANT
TRANSDUCER W/STOPCOCK (MISCELLANEOUS) ×2 IMPLANT
TUBING CIL FLEX 10 FLL-RA (TUBING) ×2 IMPLANT
WIRE ASAHI PROWATER 180CM (WIRE) ×1 IMPLANT
WIRE EMERALD 3MM-J .035X150CM (WIRE) ×1 IMPLANT

## 2021-10-26 NOTE — H&P (Addendum)
Cardiology Admission History and Physical:   Patient ID: Deborah Cooke MRN: [id number]; DOB: 10/27/1950   Admission date: 10/26/2021  PCP:  Rich Fuchs, PA   [facility] Providers Cardiologist:  None        Chief Complaint:  SOB and dizziness  Patient Profile:   Deborah Cooke is a 71 y.o. female   who is being seen 10/26/2021 for the evaluation of inferior STEMI.   History of Present Illness:   Deborah Cooke is a 71 y.o. female   who is being seen 10/26/2021 for the evaluation of inferior STEMI. She has no relevant cardiac history. She had a shoulder surgery last week and was doing fine till this morning when she started having SOB and dizziness. She also had 1 fall at home. EMS was called and she was found to be in inferior STEMI. Cath lab was activated. When she came here, she denied chest pain. She was given 325 mg Aspirin on route and 500 ml of fluids. Here, she was bradycardic to 50; with CHB. Her SBP was also soft in 90s. She was taken emergently to the cath lab.    Past Medical History:  Diagnosis Date   Adverse effect of anesthetic 01/2012   difficulty waking up.   Adverse effect of unspecified anesthetic, initial encounter 08/2011   low o2 sats.   Anemia    Anxiety    Arthritis    Breast cancer ([place]) 02/16/2017   left breast   Chronic pain    Closed supracondylar fracture of right femur ([place]) 09/18/2012   Depression    Family history of breast cancer    GERD (gastroesophageal reflux disease)    pt denies   Headache(784.0)    Hypertension    Hypothyroidism    Opioid dependence in controlled environment ([place])    Osteoporosis    Osteoporosis with fracture 09/19/2012   Pneumonia    Thyroid disease     Past Surgical History:  Procedure Laterality Date    OPEN REDUCTION INTERNAL FIXATION (ORIF) DISTAL FEMUR FRACTURE (Left Leg Upper)  10/15/2020   BACK SURGERY     fusion mid back, lumbar and thoracic   BREAST BIOPSY Left 02/16/2017   BREAST LUMPECTOMY WITH  RADIOACTIVE SEED AND SENTINEL LYMPH NODE BIOPSY Left 03/15/2017   Procedure: LEFT BREAST LUMPECTOMY WITH RADIOACTIVE SEED AND LEFT AXILLARY SENTINEL LYMPH NODE BIOPSY;  Surgeon: [name] Overall, MD;  Location: [facility];  Service: General;  Laterality: Left;   CHOLECYSTECTOMY  10/1975   FEMUR IM NAIL  09/18/2012   Procedure: INTRAMEDULLARY (IM) RETROGRADE FEMORAL NAILING;  Surgeon: [name], MD;  Location: [facility];  Service: Orthopedics;  Laterality: Right;   GASTRIC BYPASS  2003   JOINT REPLACEMENT     NECK SURGERY  2000   ORIF FEMUR FRACTURE Left 10/15/2020   Procedure: OPEN REDUCTION INTERNAL FIXATION (ORIF) DISTAL FEMUR FRACTURE;  Surgeon: Shona Needles, MD;  Location: [facility];  Service: Orthopedics;  Laterality: Left;   REPLACEMENT TOTAL KNEE  2003   right and left -4 months aoart.   REVERSE SHOULDER ARTHROPLASTY Right 10/20/2021   Procedure: REVERSE SHOULDER ARTHROPLASTY;  Surgeon: Marchia Bond, MD;  Location: [facility];  Service: Orthopedics;  Laterality: Right;   TONSILLECTOMY     WRIST FRACTURE SURGERY Bilateral 08/2011     Medications Prior to Admission: Prior to Admission medications   Medication Sig Start Date End Date Taking? Authorizing Provider  acidophilus (RISAQUAD) CAPS capsule Take 1  capsule by mouth daily.    [provider]  anastrozole (ARIMIDEX) 1 MG tablet TAKE 1 TABLET BY MOUTH  DAILY 08/21/21   Truitt Merle, MD  atenolol (TENORMIN) 25 MG tablet Take 25 mg by mouth daily. 08/10/20   [provider]  B Complex Vitamins (B COMPLEX 100 PO) Take 1 tablet by mouth daily.    [provider]  Biotin 10000 MCG TABS Take 10,000 mcg by mouth daily.    [provider]  buPROPion (WELLBUTRIN XL) 300 MG 24 hr tablet Take 300 mg by mouth daily. 06/09/21   [provider]  Calcium Carbonate (CALCIUM 600 PO) Take 600 mg by mouth in the morning and at bedtime.    [provider]  carbamazepine (TEGRETOL XR) 200 MG  12 hr tablet Take 400 mg by mouth 2 (two) times daily. 07/26/18 09/30/21  [provider]  cholecalciferol (VITAMIN D) 400 units TABS tablet Take 400 Units by mouth daily.    [provider]  cyclobenzaprine (FLEXERIL) 10 MG tablet Take 1 tablet (10 mg total) by mouth 3 (three) times daily as needed for muscle spasms. 10/17/20   Corinne Ports, PA-C  diphenhydramine-acetaminophen (TYLENOL PM) 25-500 MG TABS tablet Take 2 tablets by mouth at bedtime.    [provider]  DULoxetine (CYMBALTA) 30 MG capsule Take 30 mg by mouth 2 (two) times daily.    [provider]  ferrous sulfate 325 (65 FE) MG EC tablet Take 325 mg by mouth daily with breakfast.    [provider]  furosemide (LASIX) 20 MG tablet Take 20 mg by mouth daily as needed for fluid.    [provider]  gabapentin (NEURONTIN) 800 MG tablet Take 800 mg by mouth 4 (four) times daily. 08/19/21   [provider]  HYDROmorphone (DILAUDID) 2 MG tablet Take 1 tablet (2 mg total) by mouth every 4 (four) hours as needed for severe pain. 10/20/21   Merlene Pulling K, PA-C  hydrOXYzine (ATARAX/VISTARIL) 50 MG tablet Take 50 mg by mouth daily. 02/18/17   [provider]  latanoprost (XALATAN) 0.005 % ophthalmic solution Place 1 drop into both eyes at bedtime. 06/25/16   [provider]  levothyroxine (SYNTHROID) 88 MCG tablet Take 88 mcg by mouth daily.    [provider]  liothyronine (CYTOMEL) 25 MCG tablet Take 25 mcg by mouth daily.    [provider]  lisinopril (ZESTRIL) 20 MG tablet Take 20 mg by mouth daily.    [provider]  Multiple Vitamins-Iron (MULTIVITAMIN/IRON PO) Take 1 tablet by mouth daily.     [provider]  omeprazole (PRILOSEC) 20 MG capsule Take 20 mg by mouth daily.    [provider]  ondansetron (ZOFRAN) 4 MG tablet Take 1 tablet (4 mg total) by mouth every 8 (eight) hours as needed for nausea or vomiting.  10/20/21   Ventura Bruns, PA-C  oxyCODONE-acetaminophen (PERCOCET) 10-325 MG tablet Take 1 tablet by mouth in the morning, at noon, in the evening, and at bedtime. 10/17/20   Corinne Ports, PA-C  polyethylene glycol (MIRALAX / GLYCOLAX) 17 g packet Take 17 g by mouth 2 (two) times daily. Patient taking differently: Take 17 g by mouth daily. 10/21/20   Patrecia Pour, MD  PROAIR HFA 108 [phone] Base) MCG/ACT inhaler Inhale 2 puffs into the lungs every 4 (four) hours as needed for shortness of breath. 07/24/20   [provider]  QUEtiapine (SEROQUEL XR) 400  MG 24 hr tablet Take 400 mg by mouth at bedtime.    [provider]  simethicone (MYLICON) 80 MG chewable tablet Chew 80 mg by mouth every 6 (six) hours as needed for flatulence.    [provider]     Allergies:    Allergies  Allergen Reactions   Nsaids Nausea And Vomiting and Other (See Comments)    GI Upset (ibuprofen included)   Ibuprofen Other (See Comments)    Other reaction(s): Abdominal Pain   Tolmetin Other (See Comments)    Other reaction(s): Abdominal Pain     Social History:   Social History   Socioeconomic History   Marital status: Married    Spouse name: Not on file   Number of children: Not on file   Years of education: Not on file   Highest education level: Not on file  Occupational History   Not on file  Tobacco Use   Smoking status: Former    Packs/day: 1.00    Years: 28.00    Pack years: 28.00    Types: Cigarettes    Quit date: 01/17/1989    Years since quitting: 32.7   Smokeless tobacco: Former    Quit date: 01/17/1989  Vaping Use   Vaping Use: Never used  Substance and Sexual Activity   Alcohol use: Yes    Comment: rarely  glass of wine a month   Drug use: No   Sexual activity: Yes    Birth control/protection: None  Other Topics Concern   Not on file  Social History Narrative   Not on file   Social Determinants of Health   Financial Resource Strain: Not on file  Food  Insecurity: Not on file  Transportation Needs: Not on file  Physical Activity: Not on file  Stress: Not on file  Social Connections: Not on file  Intimate Partner Violence: Not on file    Family History:  The patient's family history includes Breast cancer (age of onset: 75) in her mother; Cancer (age of onset: 44) in her father.    ROS:  Please see the history of present illness.  All other ROS reviewed and negative.     Physical Exam/Data:   Vitals:   10/26/21 2100  Weight: 109 kg  Height: 4\' 11"  (1.499 m)   No intake or output data in the 24 hours ending 10/26/21 2128 Last 3 Weights 10/26/2021 10/20/2021 10/13/2021  Weight (lbs) 240 lb 4.8 oz 240 lb 4.8 oz 241 lb 4.8 oz  Weight (kg) 109 kg 109 kg 109.453 kg     Body mass index is 48.53 kg/m.  General:  Well nourished, well developed, moderate distress HEENT: normal Neck: no JVD Vascular: No carotid bruits; Distal pulses 2+ bilaterally   Cardiac:  normal S1, S2; RRR; no murmur  Lungs:  Poor airflow Abd: soft, nontender, no hepatomegaly  Ext: Leg edema present Musculoskeletal:  No deformities, BUE and BLE strength normal and equal Skin: warm and dry  Neuro:  CNs 2-12 intact, no focal abnormalities noted Psych:  Normal affect    EKG:  The ECG that was done was personally reviewed and demonstrates inferior STEMI  Laboratory Data:  High Sensitivity Troponin:  No results for input(s): TROPONINIHS in the last 720 hours.    Chemistry Recent Labs  Lab 10/20/21 1202 10/21/21 0309  NA 132* 129*  K 4.1 4.8  CL 98 97*  CO2 27 24  GLUCOSE 101* 134*  BUN 20 17  CREATININE 0.56 0.57  CALCIUM 8.8* 8.4*  GFRNONAA >60 >60  ANIONGAP 7 8    No results for input(s): PROT, ALBUMIN, AST, ALT, ALKPHOS, BILITOT in the last 168 hours. Lipids No results for input(s): CHOL, TRIG, HDL, LABVLDL, LDLCALC, CHOLHDL in the last 168 hours. Hematology Recent Labs  Lab 10/21/21 0309  WBC 8.5  RBC 3.55*  HGB 11.0*  HCT 33.4*  MCV  94.1  MCH 31.0  MCHC 32.9  RDW 12.7  PLT 294   Thyroid No results for input(s): TSH, FREET4 in the last 168 hours. BNPNo results for input(s): BNP, PROBNP in the last 168 hours.  DDimer No results for input(s): DDIMER in the last 168 hours.   Radiology/Studies:  No results found.   Assessment and Plan:   # Inferior STEMI # SOB # Dizziness # CHB  -Load Aspirin 325 mg -Emergent to the cath lab for TVP, LHC and possible IABP -Echo in AM -Avoid nitro and BB -Atorvastatin 80 mg -CMP/basic labs draw   For questions or updates, please contact [facility] Please consult [email or web address] for contact info under     Signed, Jaci Lazier, MD  10/26/2021 9:28 PM    I have examined the patient and reviewed assessment and plan and discussed with patient.  Agree with above as stated.    Patient with severe three vessel CAD, including heavy thrombotic lesion in the RCA and severe distal left main/ostial LAD disease.  Moderately elevated LVEDP. Anatomy is more suitable for CABG.  Unlikely to get lasting result with PCI.   Brief episode of VT post cath. Treated with defib x 1. Amio drip started.  Anticoagulation with bival and IV cangrelor for antiplatelet therapy.  IABP and temp pacer in place.  Will obtain another interventional opinion in AM to evaluate best option for revascularization.  Appreciate Dr. Lucianne Lei Trigt's urgent consultation and timely input.  Larae Grooms

## 2021-10-26 NOTE — Consult Note (Signed)
[address]            [place] [address]          [phone]       Deborah Cooke [place] Medical Record #[id number] Date of Birth: 20-Mar-1951  No ref. provider found Rich Fuchs, PA  Chief Complaint:   No chief complaint on file. Chest pain, shortness of breath, collapsed to the floor  History of Present Illness:     71 year old morbidly obese BMI 59 diabetic brought to the ED for inferior STEMI with EKG changes chest pain.  1 week ago she had a right total shoulder replacement and was recovering at home. Cardiac catheterization demonstrated left main distal stenosis and thrombus in a codominant RCA.  LV ejection showed well-preserved LV systolic function and mitral annular calcification.  An attempt at extracting thrombus from the RCA was unsuccessful.  She was started on cangrelor and bivalirudin. The patient is hemodynamically stable with TIMI-3 flow in her coronaries. Due to her severe coronary disease a balloon pump was placed in the Cath Lab as well as a temporary pacing wire for symptomatic bradycardia.  I evaluated the patient in the Cath Lab with Dr. Irish Lack for coordination of care. The patient cannot be treated with PCI due to the distal left main stenosis. The patient is a very poor candidate for CABG because of her morbid obesity which would make it very difficult for her to progress postoperatively for extubation.  She is 4 foot 11 inches tall and weighs 250 pounds. It is difficult to evaluate her legs for saphenous vein conduit due to the extreme obesity.  Coronary arteriograms indicate adequate targets in the LAD circumflex vessels and a small suboptimal posterior descending branch of the RCA.  Current Activity/ Functional Status: She lives with her mhusband   Past Medical History:  Diagnosis Date   Adverse effect of anesthetic 01/2012   difficulty waking up.   Adverse effect of unspecified anesthetic, initial encounter  08/2011   low o2 sats.   Anemia    Anxiety    Arthritis    Breast cancer ([place]) 02/16/2017   left breast   Chronic pain    Closed supracondylar fracture of right femur ([facility]) 09/18/2012   Depression    Family history of breast cancer    GERD (gastroesophageal reflux disease)    pt denies   Headache(784.0)    Hypertension    Hypothyroidism    Opioid dependence in controlled environment ([place])    Osteoporosis    Osteoporosis with fracture 09/19/2012   Pneumonia    Thyroid disease     Past Surgical History:  Procedure Laterality Date    OPEN REDUCTION INTERNAL FIXATION (ORIF) DISTAL FEMUR FRACTURE (Left Leg Upper)  10/15/2020   BACK SURGERY     fusion mid back, lumbar and thoracic   BREAST BIOPSY Left 02/16/2017   BREAST LUMPECTOMY WITH RADIOACTIVE SEED AND SENTINEL LYMPH NODE BIOPSY Left 03/15/2017   Procedure: LEFT BREAST LUMPECTOMY WITH RADIOACTIVE SEED AND LEFT AXILLARY SENTINEL LYMPH NODE BIOPSY;  Surgeon: [name] Overall, MD;  Location: [facility];  Service: General;  Laterality: Left;   CHOLECYSTECTOMY  10/1975   FEMUR IM NAIL  09/18/2012   Procedure: INTRAMEDULLARY (IM) RETROGRADE FEMORAL NAILING;  Surgeon: [name], MD;  Location: [facility];  Service: Orthopedics;  Laterality: Right;   GASTRIC BYPASS  2003   JOINT REPLACEMENT     NECK SURGERY  2000   ORIF FEMUR FRACTURE Left 10/15/2020   Procedure: OPEN REDUCTION INTERNAL FIXATION (ORIF) DISTAL FEMUR FRACTURE;  Surgeon: Shona Needles, MD;  Location: [facility];  Service: Orthopedics;  Laterality: Left;   REPLACEMENT TOTAL KNEE  2003   right and left -4 months aoart.   REVERSE SHOULDER ARTHROPLASTY Right 10/20/2021   Procedure: REVERSE SHOULDER ARTHROPLASTY;  Surgeon: Marchia Bond, MD;  Location: [facility];  Service: Orthopedics;  Laterality: Right;   TONSILLECTOMY     WRIST FRACTURE SURGERY Bilateral 08/2011    Social History   Tobacco Use  Smoking Status Former   Packs/day: 1.00   Years: 28.00    Pack years: 28.00   Types: Cigarettes   Quit date: 01/17/1989   Years since quitting: 32.7  Smokeless Tobacco Former   Quit date: 01/17/1989    Social History   Substance and Sexual Activity  Alcohol Use Yes   Comment: rarely  glass of wine a month    Social History   Socioeconomic History   Marital status: Married    Spouse name: Not on file   Number of children: Not on file   Years of education: Not on file   Highest education level: Not on file  Occupational History   Not on file  Tobacco Use   Smoking status: Former    Packs/day: 1.00    Years: 28.00    Pack years: 28.00    Types: Cigarettes    Quit date: 01/17/1989    Years since quitting: 32.7   Smokeless tobacco: Former    Quit date: 01/17/1989  Vaping Use   Vaping Use: Never used  Substance and Sexual Activity   Alcohol use: Yes    Comment: rarely  glass of wine a month   Drug use: No   Sexual activity: Yes    Birth control/protection: None  Other Topics Concern   Not on file  Social History Narrative   Not on file   Social Determinants of Health   Financial Resource Strain: Not on file  Food Insecurity: Not on file  Transportation Needs: Not on file  Physical Activity: Not on file  Stress: Not on file  Social Connections: Not on file  Intimate Partner Violence: Not on file    Allergies  Allergen Reactions   Nsaids Nausea And Vomiting and Other (See Comments)    GI Upset (ibuprofen included)   Ibuprofen Other (See Comments)    Other reaction(s): Abdominal Pain   Tolmetin Other (See Comments)    Other reaction(s): Abdominal Pain     Current Facility-Administered Medications  Medication Dose Route Frequency Provider Last Rate Last Admin   bivalirudin (ANGIOMAX) 250 mg in sodium chloride 0.9 % 500 mL (0.5 mg/mL) infusion  0.1 mg/kg/hr Intravenous Continuous Lyndee Leo, RPH         Family History  Problem Relation Age of Onset   Breast cancer Mother 28       died at 25   Cancer  Father 58       uretherial cancer      Review of Systems:     Cardiac Review of Systems: Y or N  Chest Pain [  y  ]  Resting SOB [   ] Exertional SOB  [  y]  Orthopnea [  ]   Pedal Edema [   ]    Palpitations [  ] Syncope  [  ]  Presyncope [   y]  General Review of Systems: [Y] = yes [  ]=no Constitional: recent weight change [  ]; anorexia [  ]; fatigue [  ]; nausea [  ]; night sweats [  ]; fever [  ]; or chills [  ];                                                                                                                                          Dental: poor dentition[  ]; Last Dentist visit: n  Eye : blurred vision [  ]; diplopia [   ]; vision changes [  ];  Amaurosis fugax[  ]; Resp: cough [  ];  wheezing[  ];  hemoptysis[  ]; shortness of breath[  y]; paroxysmal nocturnal dyspnea[  ]; dyspnea on exertion[ y ]; or orthopnea[  ];  GI:  gallstones[  ], vomiting[  ];  dysphagia[  ]; melena[  ];  hematochezia [  ]; heartburn[  ];   Hx of  Colonoscopy[  ]; GU: kidney stones [  ]; hematuria[  ];   dysuria [  ];  nocturia[  ];  history of     obstruction [  ];                 Skin: rash, swelling[  ];, hair loss[  ];  peripheral edema[  ];  or itching[  ]; Musculosketetal: myalgias[  ];  joint swelling[  ];  joint erythema[  ];  joint pain[  ];  back pain[  ];  Heme/Lymph: bruising[  ];  bleeding[  ];  anemia[  ];  Neuro: TIA[  ];  headaches[  ];  stroke[  ];  vertigo[  ];  seizures[  ];   paresthesias[  ];  difficulty walking[  ];  Psych:depression[  ]; anxiety[ y ]; bipolar disorder followed by psychiatry  Endocrine: diabetes[  ];  thyroid dysfunction[  ]; history of left breast cancer treated with lumpectomy and radiation seeds 2018  Immunizations: Flu [  ]; Pneumococcal[  ];  Other:  Physical Exam: BP (!) 146/104    Pulse 60    Resp 15    Ht 4\' 11"  (1.499 m)    Wt 109 kg    SpO2 (!) 72%    BMI 48.53 kg/m   General-anxious morbidly obese short female on Cath Lab table slight  wheezing on oxygen mask Surgical bandage/dressing over posterior right shoulder Sinus rhythm without audible murmur Distant breath sounds Abdomen obese distended extends above the sternum Extremities with obese thigh with surgical scars bilaterally from orthopedic procedures.  Balloon pump and temporary pacing wire sheath in the right groin No focal neurologic deficit   Diagnostic Studies & Laboratory data: Coronary arteriograms reviewed in Cath Lab with Dr.Varanasi  Recent Radiology Findings:   No results found.    Recent Lab Findings: Lab Results  Component Value Date   WBC 8.5 10/21/2021   HGB 11.0 (L) 10/21/2021   HCT 33.4 (L) 10/21/2021   PLT 294 10/21/2021   GLUCOSE 134 (H) 10/21/2021   ALT 19 10/13/2021   AST 25 10/13/2021   NA 129 (L) 10/21/2021   K 4.8 10/21/2021   CL 97 (L) 10/21/2021   CREATININE 0.57 10/21/2021   BUN 17 10/21/2021   CO2 24 10/21/2021   TSH 2.819 10/15/2020      Assessment / Plan:   Patient is a 71 year old morbidly obese female with STEMI found to have severe left main disease and thrombus in RCA with TIMI-3 flow in her vessels and stable hemodynamics on temporary pacing wire.  She is a very poor surgical candidate because of her obesity and the high probability she would never be successfully extubated after sternotomy. I have asked the cardiology team to assess the efficacy of PCI of her distal left main which would also be extremely high risk.  If PCI is not feasible then high risk CABG would be her best chance for survival.

## 2021-10-26 NOTE — Progress Notes (Signed)
ANTICOAGULATION CONSULT NOTE - Initial Consult  Pharmacy Consult for bivalirudin Indication: chest pain/ACS  Allergies  Allergen Reactions   Nsaids Nausea And Vomiting and Other (See Comments)    GI Upset (ibuprofen included)   Ibuprofen Other (See Comments)    Other reaction(s): Abdominal Pain   Tolmetin Other (See Comments)    Other reaction(s): Abdominal Pain     Patient Measurements: Height: 4\' 11"  (149.9 cm) Weight: 109 kg (240 lb 4.8 oz) IBW/kg (Calculated) : 43.2   Vital Signs:    Labs: No results for input(s): HGB, HCT, PLT, APTT, LABPROT, INR, HEPARINUNFRC, HEPRLOWMOCWT, CREATININE, CKTOTAL, CKMB, TROPONINIHS in the last 72 hours.  Estimated Creatinine Clearance: 69.7 mL/min (by C-G formula based on SCr of 0.57 mg/dL).   Medical History: Past Medical History:  Diagnosis Date   Adverse effect of anesthetic 01/2012   difficulty waking up.   Adverse effect of unspecified anesthetic, initial encounter 08/2011   low o2 sats.   Anemia    Anxiety    Arthritis    Breast cancer ([facility]) 02/16/2017   left breast   Chronic pain    Closed supracondylar fracture of right femur ([facility]) 09/18/2012   Depression    Family history of breast cancer    GERD (gastroesophageal reflux disease)    pt denies   Headache(784.0)    Hypertension    Hypothyroidism    Opioid dependence in controlled environment ([place])    Osteoporosis    Osteoporosis with fracture 09/19/2012   Pneumonia    Thyroid disease     Medications:  Medications Prior to Admission  Medication Sig Dispense Refill Last Dose   acidophilus (RISAQUAD) CAPS capsule Take 1 capsule by mouth daily.      anastrozole (ARIMIDEX) 1 MG tablet TAKE 1 TABLET BY MOUTH  DAILY 90 tablet 3    atenolol (TENORMIN) 25 MG tablet Take 25 mg by mouth daily.      B Complex Vitamins (B COMPLEX 100 PO) Take 1 tablet by mouth daily.      Biotin 10000 MCG TABS Take 10,000 mcg by mouth daily.      buPROPion (WELLBUTRIN XL) 300 MG 24 hr  tablet Take 300 mg by mouth daily.      Calcium Carbonate (CALCIUM 600 PO) Take 600 mg by mouth in the morning and at bedtime.      carbamazepine (TEGRETOL XR) 200 MG 12 hr tablet Take 400 mg by mouth 2 (two) times daily.      cholecalciferol (VITAMIN D) 400 units TABS tablet Take 400 Units by mouth daily.      cyclobenzaprine (FLEXERIL) 10 MG tablet Take 1 tablet (10 mg total) by mouth 3 (three) times daily as needed for muscle spasms. 21 tablet 0    diphenhydramine-acetaminophen (TYLENOL PM) 25-500 MG TABS tablet Take 2 tablets by mouth at bedtime.      DULoxetine (CYMBALTA) 30 MG capsule Take 30 mg by mouth 2 (two) times daily.      ferrous sulfate 325 (65 FE) MG EC tablet Take 325 mg by mouth daily with breakfast.      furosemide (LASIX) 20 MG tablet Take 20 mg by mouth daily as needed for fluid.      gabapentin (NEURONTIN) 800 MG tablet Take 800 mg by mouth 4 (four) times daily.      HYDROmorphone (DILAUDID) 2 MG tablet Take 1 tablet (2 mg total) by mouth every 4 (four) hours as needed for severe pain. 30 tablet 0  hydrOXYzine (ATARAX/VISTARIL) 50 MG tablet Take 50 mg by mouth daily.  0    latanoprost (XALATAN) 0.005 % ophthalmic solution Place 1 drop into both eyes at bedtime.      levothyroxine (SYNTHROID) 88 MCG tablet Take 88 mcg by mouth daily.      liothyronine (CYTOMEL) 25 MCG tablet Take 25 mcg by mouth daily.      lisinopril (ZESTRIL) 20 MG tablet Take 20 mg by mouth daily.      Multiple Vitamins-Iron (MULTIVITAMIN/IRON PO) Take 1 tablet by mouth daily.       omeprazole (PRILOSEC) 20 MG capsule Take 20 mg by mouth daily.      ondansetron (ZOFRAN) 4 MG tablet Take 1 tablet (4 mg total) by mouth every 8 (eight) hours as needed for nausea or vomiting. 10 tablet 0    oxyCODONE-acetaminophen (PERCOCET) 10-325 MG tablet Take 1 tablet by mouth in the morning, at noon, in the evening, and at bedtime. 20 tablet 0    polyethylene glycol (MIRALAX / GLYCOLAX) 17 g packet Take 17 g by mouth 2  (two) times daily. (Patient taking differently: Take 17 g by mouth daily.) 14 each 0    PROAIR HFA 108 (90 Base) MCG/ACT inhaler Inhale 2 puffs into the lungs every 4 (four) hours as needed for shortness of breath.      QUEtiapine (SEROQUEL XR) 400 MG 24 hr tablet Take 400 mg by mouth at bedtime.      simethicone (MYLICON) 80 MG chewable tablet Chew 80 mg by mouth every 6 (six) hours as needed for flatulence.       Assessment: 71 year old female admitted to [facility] as code stemi found to have multivessel disease. Temp pacer, IABP being placed in lab. Surgery is being consulted for CABG 2/7 if not needed sooner.   Of note she is in acute renal failure, scr was normal last week and now up to 2.1 on iSTAT. Rechecking labs now  Patient was started on Bivalirudan and Cangrelor in the cath lab. Discussed dosing with interventionalist, given extensive clot burden will continue bivalirudin at higher dose initially.   Goal of Therapy:  aPTT 50-85 seconds Monitor platelets by anticoagulation protocol: Yes   Plan:  Bivalirudin 0.25mg /kg/hr - will recheck aptt in 4 hours and adjust dose accordingly Reduce cangrelor to 11mcg/kg/min   Erin Hearing PharmD., BCPS Clinical Pharmacist 10/26/2021 9:18 PM

## 2021-10-27 ENCOUNTER — Inpatient Hospital Stay (HOSPITAL_COMMUNITY): Payer: Medicare Other

## 2021-10-27 ENCOUNTER — Encounter (HOSPITAL_COMMUNITY): Payer: Self-pay | Admitting: Interventional Cardiology

## 2021-10-27 ENCOUNTER — Other Ambulatory Visit: Payer: Self-pay

## 2021-10-27 ENCOUNTER — Inpatient Hospital Stay (HOSPITAL_COMMUNITY): Payer: Medicare Other | Admitting: Anesthesiology

## 2021-10-27 ENCOUNTER — Other Ambulatory Visit (HOSPITAL_COMMUNITY): Payer: Medicare Other

## 2021-10-27 ENCOUNTER — Inpatient Hospital Stay (HOSPITAL_COMMUNITY)
Admission: EM | Disposition: A | Discharge status: Skilled Nursing Facility | Payer: Self-pay | Source: Home / Self Care | Attending: Cardiothoracic Surgery

## 2021-10-27 DIAGNOSIS — I2119 ST elevation (STEMI) myocardial infarction involving other coronary artery of inferior wall: Secondary | ICD-10-CM | POA: Diagnosis not present

## 2021-10-27 DIAGNOSIS — Z951 Presence of aortocoronary bypass graft: Secondary | ICD-10-CM

## 2021-10-27 DIAGNOSIS — I2510 Atherosclerotic heart disease of native coronary artery without angina pectoris: Secondary | ICD-10-CM | POA: Diagnosis not present

## 2021-10-27 HISTORY — PX: CORONARY ARTERY BYPASS GRAFT: SHX141

## 2021-10-27 HISTORY — PX: TEE WITHOUT CARDIOVERSION: SHX5443

## 2021-10-27 HISTORY — PX: ENDOVEIN HARVEST OF GREATER SAPHENOUS VEIN: SHX5059

## 2021-10-27 LAB — POCT I-STAT EG7
Acid-Base Excess: 1 mmol/L (ref 0.0–2.0)
Bicarbonate: 26.4 mmol/L (ref 20.0–28.0)
Calcium, Ion: 1 mmol/L — ABNORMAL LOW (ref 1.15–1.40)
HCT: 22 % — ABNORMAL LOW (ref 36.0–46.0)
Hemoglobin: 7.5 g/dL — ABNORMAL LOW (ref 12.0–15.0)
O2 Saturation: 61 %
Potassium: 3.9 mmol/L (ref 3.5–5.1)
Sodium: 139 mmol/L (ref 135–145)
TCO2: 28 mmol/L (ref 22–32)
pCO2, Ven: 48.6 mmHg (ref 44.0–60.0)
pH, Ven: 7.343 (ref 7.250–7.430)
pO2, Ven: 34 mmHg (ref 32.0–45.0)

## 2021-10-27 LAB — POCT I-STAT, CHEM 8
BUN: 54 mg/dL — ABNORMAL HIGH (ref 8–23)
BUN: 45 mg/dL — ABNORMAL HIGH (ref 8–23)
BUN: 43 mg/dL — ABNORMAL HIGH (ref 8–23)
BUN: 44 mg/dL — ABNORMAL HIGH (ref 8–23)
BUN: 41 mg/dL — ABNORMAL HIGH (ref 8–23)
BUN: 39 mg/dL — ABNORMAL HIGH (ref 8–23)
BUN: 35 mg/dL — ABNORMAL HIGH (ref 8–23)
BUN: 39 mg/dL — ABNORMAL HIGH (ref 8–23)
BUN: 35 mg/dL — ABNORMAL HIGH (ref 8–23)
Calcium, Ion: 1.1 mmol/L — ABNORMAL LOW (ref 1.15–1.40)
Calcium, Ion: 1.16 mmol/L (ref 1.15–1.40)
Calcium, Ion: 0.99 mmol/L — ABNORMAL LOW (ref 1.15–1.40)
Calcium, Ion: 1.09 mmol/L — ABNORMAL LOW (ref 1.15–1.40)
Calcium, Ion: 1.02 mmol/L — ABNORMAL LOW (ref 1.15–1.40)
Calcium, Ion: 1.02 mmol/L — ABNORMAL LOW (ref 1.15–1.40)
Calcium, Ion: 1.07 mmol/L — ABNORMAL LOW (ref 1.15–1.40)
Calcium, Ion: 1.12 mmol/L — ABNORMAL LOW (ref 1.15–1.40)
Calcium, Ion: 1.27 mmol/L (ref 1.15–1.40)
Chloride: 103 mmol/L (ref 98–111)
Chloride: 103 mmol/L (ref 98–111)
Chloride: 100 mmol/L (ref 98–111)
Chloride: 105 mmol/L (ref 98–111)
Chloride: 102 mmol/L (ref 98–111)
Chloride: 103 mmol/L (ref 98–111)
Chloride: 104 mmol/L (ref 98–111)
Chloride: 105 mmol/L (ref 98–111)
Chloride: 105 mmol/L (ref 98–111)
Creatinine, Ser: 2.1 mg/dL — ABNORMAL HIGH (ref 0.44–1.00)
Creatinine, Ser: 1.1 mg/dL — ABNORMAL HIGH (ref 0.44–1.00)
Creatinine, Ser: 1.2 mg/dL — ABNORMAL HIGH (ref 0.44–1.00)
Creatinine, Ser: 1.2 mg/dL — ABNORMAL HIGH (ref 0.44–1.00)
Creatinine, Ser: 1.2 mg/dL — ABNORMAL HIGH (ref 0.44–1.00)
Creatinine, Ser: 1 mg/dL (ref 0.44–1.00)
Creatinine, Ser: 0.8 mg/dL (ref 0.44–1.00)
Creatinine, Ser: 0.9 mg/dL (ref 0.44–1.00)
Creatinine, Ser: 0.9 mg/dL (ref 0.44–1.00)
Glucose, Bld: 132 mg/dL — ABNORMAL HIGH (ref 70–99)
Glucose, Bld: 152 mg/dL — ABNORMAL HIGH (ref 70–99)
Glucose, Bld: 173 mg/dL — ABNORMAL HIGH (ref 70–99)
Glucose, Bld: 197 mg/dL — ABNORMAL HIGH (ref 70–99)
Glucose, Bld: 187 mg/dL — ABNORMAL HIGH (ref 70–99)
Glucose, Bld: 172 mg/dL — ABNORMAL HIGH (ref 70–99)
Glucose, Bld: 187 mg/dL — ABNORMAL HIGH (ref 70–99)
Glucose, Bld: 188 mg/dL — ABNORMAL HIGH (ref 70–99)
Glucose, Bld: 186 mg/dL — ABNORMAL HIGH (ref 70–99)
HCT: 29 % — ABNORMAL LOW (ref 36.0–46.0)
HCT: 33 % — ABNORMAL LOW (ref 36.0–46.0)
HCT: 21 % — ABNORMAL LOW (ref 36.0–46.0)
HCT: 28 % — ABNORMAL LOW (ref 36.0–46.0)
HCT: 26 % — ABNORMAL LOW (ref 36.0–46.0)
HCT: 25 % — ABNORMAL LOW (ref 36.0–46.0)
HCT: 25 % — ABNORMAL LOW (ref 36.0–46.0)
HCT: 27 % — ABNORMAL LOW (ref 36.0–46.0)
HCT: 26 % — ABNORMAL LOW (ref 36.0–46.0)
Hemoglobin: 9.9 g/dL — ABNORMAL LOW (ref 12.0–15.0)
Hemoglobin: 11.2 g/dL — ABNORMAL LOW (ref 12.0–15.0)
Hemoglobin: 7.1 g/dL — ABNORMAL LOW (ref 12.0–15.0)
Hemoglobin: 9.5 g/dL — ABNORMAL LOW (ref 12.0–15.0)
Hemoglobin: 8.8 g/dL — ABNORMAL LOW (ref 12.0–15.0)
Hemoglobin: 8.5 g/dL — ABNORMAL LOW (ref 12.0–15.0)
Hemoglobin: 8.5 g/dL — ABNORMAL LOW (ref 12.0–15.0)
Hemoglobin: 9.2 g/dL — ABNORMAL LOW (ref 12.0–15.0)
Hemoglobin: 8.8 g/dL — ABNORMAL LOW (ref 12.0–15.0)
Potassium: 4.1 mmol/L (ref 3.5–5.1)
Potassium: 3.8 mmol/L (ref 3.5–5.1)
Potassium: 3.5 mmol/L (ref 3.5–5.1)
Potassium: 4.5 mmol/L (ref 3.5–5.1)
Potassium: 4.2 mmol/L (ref 3.5–5.1)
Potassium: 4.8 mmol/L (ref 3.5–5.1)
Potassium: 3.9 mmol/L (ref 3.5–5.1)
Potassium: 4.1 mmol/L (ref 3.5–5.1)
Potassium: 4.5 mmol/L (ref 3.5–5.1)
Sodium: 135 mmol/L (ref 135–145)
Sodium: 137 mmol/L (ref 135–145)
Sodium: 135 mmol/L (ref 135–145)
Sodium: 135 mmol/L (ref 135–145)
Sodium: 135 mmol/L (ref 135–145)
Sodium: 136 mmol/L (ref 135–145)
Sodium: 139 mmol/L (ref 135–145)
Sodium: 139 mmol/L (ref 135–145)
Sodium: 139 mmol/L (ref 135–145)
TCO2: 22 mmol/L (ref 22–32)
TCO2: 28 mmol/L (ref 22–32)
TCO2: 25 mmol/L (ref 22–32)
TCO2: 26 mmol/L (ref 22–32)
TCO2: 26 mmol/L (ref 22–32)
TCO2: 26 mmol/L (ref 22–32)
TCO2: 26 mmol/L (ref 22–32)
TCO2: 26 mmol/L (ref 22–32)
TCO2: 25 mmol/L (ref 22–32)

## 2021-10-27 LAB — POCT I-STAT 7, (LYTES, BLD GAS, ICA,H+H)
Acid-Base Excess: 1 mmol/L (ref 0.0–2.0)
Acid-Base Excess: 0 mmol/L (ref 0.0–2.0)
Acid-Base Excess: 0 mmol/L (ref 0.0–2.0)
Acid-Base Excess: 0 mmol/L (ref 0.0–2.0)
Acid-base deficit: 1 mmol/L (ref 0.0–2.0)
Acid-base deficit: 1 mmol/L (ref 0.0–2.0)
Acid-base deficit: 2 mmol/L (ref 0.0–2.0)
Acid-base deficit: 4 mmol/L — ABNORMAL HIGH (ref 0.0–2.0)
Acid-base deficit: 1 mmol/L (ref 0.0–2.0)
Acid-base deficit: 1 mmol/L (ref 0.0–2.0)
Acid-base deficit: 2 mmol/L (ref 0.0–2.0)
Acid-base deficit: 3 mmol/L — ABNORMAL HIGH (ref 0.0–2.0)
Acid-base deficit: 7 mmol/L — ABNORMAL HIGH (ref 0.0–2.0)
Acid-base deficit: 5 mmol/L — ABNORMAL HIGH (ref 0.0–2.0)
Acid-base deficit: 5 mmol/L — ABNORMAL HIGH (ref 0.0–2.0)
Bicarbonate: 27 mmol/L (ref 20.0–28.0)
Bicarbonate: 23 mmol/L (ref 20.0–28.0)
Bicarbonate: 23.3 mmol/L (ref 20.0–28.0)
Bicarbonate: 24.3 mmol/L (ref 20.0–28.0)
Bicarbonate: 23.9 mmol/L (ref 20.0–28.0)
Bicarbonate: 25.6 mmol/L (ref 20.0–28.0)
Bicarbonate: 23.8 mmol/L (ref 20.0–28.0)
Bicarbonate: 24.8 mmol/L (ref 20.0–28.0)
Bicarbonate: 25.4 mmol/L (ref 20.0–28.0)
Bicarbonate: 24.3 mmol/L (ref 20.0–28.0)
Bicarbonate: 25.3 mmol/L (ref 20.0–28.0)
Bicarbonate: 23.8 mmol/L (ref 20.0–28.0)
Bicarbonate: 22.7 mmol/L (ref 20.0–28.0)
Bicarbonate: 22.6 mmol/L (ref 20.0–28.0)
Bicarbonate: 21.6 mmol/L (ref 20.0–28.0)
Calcium, Ion: 1.15 mmol/L (ref 1.15–1.40)
Calcium, Ion: 0.97 mmol/L — ABNORMAL LOW (ref 1.15–1.40)
Calcium, Ion: 0.97 mmol/L — ABNORMAL LOW (ref 1.15–1.40)
Calcium, Ion: 1.11 mmol/L — ABNORMAL LOW (ref 1.15–1.40)
Calcium, Ion: 1.01 mmol/L — ABNORMAL LOW (ref 1.15–1.40)
Calcium, Ion: 1.03 mmol/L — ABNORMAL LOW (ref 1.15–1.40)
Calcium, Ion: 1 mmol/L — ABNORMAL LOW (ref 1.15–1.40)
Calcium, Ion: 1.03 mmol/L — ABNORMAL LOW (ref 1.15–1.40)
Calcium, Ion: 1.04 mmol/L — ABNORMAL LOW (ref 1.15–1.40)
Calcium, Ion: 1.07 mmol/L — ABNORMAL LOW (ref 1.15–1.40)
Calcium, Ion: 1.12 mmol/L — ABNORMAL LOW (ref 1.15–1.40)
Calcium, Ion: 1.28 mmol/L (ref 1.15–1.40)
Calcium, Ion: 1.39 mmol/L (ref 1.15–1.40)
Calcium, Ion: 1.18 mmol/L (ref 1.15–1.40)
Calcium, Ion: 1.14 mmol/L — ABNORMAL LOW (ref 1.15–1.40)
HCT: 31 % — ABNORMAL LOW (ref 36.0–46.0)
HCT: 23 % — ABNORMAL LOW (ref 36.0–46.0)
HCT: 24 % — ABNORMAL LOW (ref 36.0–46.0)
HCT: 27 % — ABNORMAL LOW (ref 36.0–46.0)
HCT: 22 % — ABNORMAL LOW (ref 36.0–46.0)
HCT: 26 % — ABNORMAL LOW (ref 36.0–46.0)
HCT: 25 % — ABNORMAL LOW (ref 36.0–46.0)
HCT: 28 % — ABNORMAL LOW (ref 36.0–46.0)
HCT: 23 % — ABNORMAL LOW (ref 36.0–46.0)
HCT: 20 % — ABNORMAL LOW (ref 36.0–46.0)
HCT: 25 % — ABNORMAL LOW (ref 36.0–46.0)
HCT: 22 % — ABNORMAL LOW (ref 36.0–46.0)
HCT: 22 % — ABNORMAL LOW (ref 36.0–46.0)
HCT: 18 % — ABNORMAL LOW (ref 36.0–46.0)
HCT: 20 % — ABNORMAL LOW (ref 36.0–46.0)
Hemoglobin: 10.5 g/dL — ABNORMAL LOW (ref 12.0–15.0)
Hemoglobin: 7.8 g/dL — ABNORMAL LOW (ref 12.0–15.0)
Hemoglobin: 8.2 g/dL — ABNORMAL LOW (ref 12.0–15.0)
Hemoglobin: 9.2 g/dL — ABNORMAL LOW (ref 12.0–15.0)
Hemoglobin: 7.5 g/dL — ABNORMAL LOW (ref 12.0–15.0)
Hemoglobin: 8.8 g/dL — ABNORMAL LOW (ref 12.0–15.0)
Hemoglobin: 8.5 g/dL — ABNORMAL LOW (ref 12.0–15.0)
Hemoglobin: 9.5 g/dL — ABNORMAL LOW (ref 12.0–15.0)
Hemoglobin: 7.8 g/dL — ABNORMAL LOW (ref 12.0–15.0)
Hemoglobin: 6.8 g/dL — CL (ref 12.0–15.0)
Hemoglobin: 8.5 g/dL — ABNORMAL LOW (ref 12.0–15.0)
Hemoglobin: 7.5 g/dL — ABNORMAL LOW (ref 12.0–15.0)
Hemoglobin: 7.5 g/dL — ABNORMAL LOW (ref 12.0–15.0)
Hemoglobin: 6.1 g/dL — CL (ref 12.0–15.0)
Hemoglobin: 6.8 g/dL — CL (ref 12.0–15.0)
O2 Saturation: 100 %
O2 Saturation: 100 %
O2 Saturation: 100 %
O2 Saturation: 100 %
O2 Saturation: 100 %
O2 Saturation: 100 %
O2 Saturation: 100 %
O2 Saturation: 100 %
O2 Saturation: 100 %
O2 Saturation: 100 %
O2 Saturation: 100 %
O2 Saturation: 100 %
O2 Saturation: 93 %
O2 Saturation: 98 %
O2 Saturation: 99 %
Patient temperature: 35.4
Patient temperature: 35.3
Patient temperature: 36
Potassium: 3.8 mmol/L (ref 3.5–5.1)
Potassium: 3.5 mmol/L (ref 3.5–5.1)
Potassium: 4 mmol/L (ref 3.5–5.1)
Potassium: 4.5 mmol/L (ref 3.5–5.1)
Potassium: 3.5 mmol/L (ref 3.5–5.1)
Potassium: 4.1 mmol/L (ref 3.5–5.1)
Potassium: 4.3 mmol/L (ref 3.5–5.1)
Potassium: 4.8 mmol/L (ref 3.5–5.1)
Potassium: 4.6 mmol/L (ref 3.5–5.1)
Potassium: 3.9 mmol/L (ref 3.5–5.1)
Potassium: 4.1 mmol/L (ref 3.5–5.1)
Potassium: 4.5 mmol/L (ref 3.5–5.1)
Potassium: 3.8 mmol/L (ref 3.5–5.1)
Potassium: 3.7 mmol/L (ref 3.5–5.1)
Potassium: 3.8 mmol/L (ref 3.5–5.1)
Sodium: 137 mmol/L (ref 135–145)
Sodium: 135 mmol/L (ref 135–145)
Sodium: 136 mmol/L (ref 135–145)
Sodium: 136 mmol/L (ref 135–145)
Sodium: 135 mmol/L (ref 135–145)
Sodium: 136 mmol/L (ref 135–145)
Sodium: 136 mmol/L (ref 135–145)
Sodium: 136 mmol/L (ref 135–145)
Sodium: 137 mmol/L (ref 135–145)
Sodium: 139 mmol/L (ref 135–145)
Sodium: 140 mmol/L (ref 135–145)
Sodium: 140 mmol/L (ref 135–145)
Sodium: 140 mmol/L (ref 135–145)
Sodium: 141 mmol/L (ref 135–145)
Sodium: 139 mmol/L (ref 135–145)
TCO2: 29 mmol/L (ref 22–32)
TCO2: 24 mmol/L (ref 22–32)
TCO2: 25 mmol/L (ref 22–32)
TCO2: 26 mmol/L (ref 22–32)
TCO2: 25 mmol/L (ref 22–32)
TCO2: 27 mmol/L (ref 22–32)
TCO2: 25 mmol/L (ref 22–32)
TCO2: 26 mmol/L (ref 22–32)
TCO2: 27 mmol/L (ref 22–32)
TCO2: 26 mmol/L (ref 22–32)
TCO2: 27 mmol/L (ref 22–32)
TCO2: 25 mmol/L (ref 22–32)
TCO2: 25 mmol/L (ref 22–32)
TCO2: 24 mmol/L (ref 22–32)
TCO2: 23 mmol/L (ref 22–32)
pCO2 arterial: 60.3 mmHg — ABNORMAL HIGH (ref 32.0–48.0)
pCO2 arterial: 40.7 mmHg (ref 32.0–48.0)
pCO2 arterial: 42.5 mmHg (ref 32.0–48.0)
pCO2 arterial: 48.2 mmHg — ABNORMAL HIGH (ref 32.0–48.0)
pCO2 arterial: 39 mmHg (ref 32.0–48.0)
pCO2 arterial: 40.9 mmHg (ref 32.0–48.0)
pCO2 arterial: 34.6 mmHg (ref 32.0–48.0)
pCO2 arterial: 37.8 mmHg (ref 32.0–48.0)
pCO2 arterial: 44.3 mmHg (ref 32.0–48.0)
pCO2 arterial: 48.6 mmHg — ABNORMAL HIGH (ref 32.0–48.0)
pCO2 arterial: 53.6 mmHg — ABNORMAL HIGH (ref 32.0–48.0)
pCO2 arterial: 52.7 mmHg — ABNORMAL HIGH (ref 32.0–48.0)
pCO2 arterial: 72.8 mmHg (ref 32.0–48.0)
pCO2 arterial: 57 mmHg — ABNORMAL HIGH (ref 32.0–48.0)
pCO2 arterial: 48.6 mmHg — ABNORMAL HIGH (ref 32.0–48.0)
pH, Arterial: 7.258 — ABNORMAL LOW (ref 7.350–7.450)
pH, Arterial: 7.286 — ABNORMAL LOW (ref 7.350–7.450)
pH, Arterial: 7.347 — ABNORMAL LOW (ref 7.350–7.450)
pH, Arterial: 7.385 (ref 7.350–7.450)
pH, Arterial: 7.394 (ref 7.350–7.450)
pH, Arterial: 7.405 (ref 7.350–7.450)
pH, Arterial: 7.425 (ref 7.350–7.450)
pH, Arterial: 7.446 (ref 7.350–7.450)
pH, Arterial: 7.367 (ref 7.350–7.450)
pH, Arterial: 7.282 — ABNORMAL LOW (ref 7.350–7.450)
pH, Arterial: 7.308 — ABNORMAL LOW (ref 7.350–7.450)
pH, Arterial: 7.262 — ABNORMAL LOW (ref 7.350–7.450)
pH, Arterial: 7.091 — CL (ref 7.350–7.450)
pH, Arterial: 7.197 — CL (ref 7.350–7.450)
pH, Arterial: 7.25 — ABNORMAL LOW (ref 7.350–7.450)
pO2, Arterial: 492 mmHg — ABNORMAL HIGH (ref 83.0–108.0)
pO2, Arterial: 335 mmHg — ABNORMAL HIGH (ref 83.0–108.0)
pO2, Arterial: 357 mmHg — ABNORMAL HIGH (ref 83.0–108.0)
pO2, Arterial: 465 mmHg — ABNORMAL HIGH (ref 83.0–108.0)
pO2, Arterial: 345 mmHg — ABNORMAL HIGH (ref 83.0–108.0)
pO2, Arterial: 359 mmHg — ABNORMAL HIGH (ref 83.0–108.0)
pO2, Arterial: 320 mmHg — ABNORMAL HIGH (ref 83.0–108.0)
pO2, Arterial: 391 mmHg — ABNORMAL HIGH (ref 83.0–108.0)
pO2, Arterial: 367 mmHg — ABNORMAL HIGH (ref 83.0–108.0)
pO2, Arterial: 343 mmHg — ABNORMAL HIGH (ref 83.0–108.0)
pO2, Arterial: 412 mmHg — ABNORMAL HIGH (ref 83.0–108.0)
pO2, Arterial: 348 mmHg — ABNORMAL HIGH (ref 83.0–108.0)
pO2, Arterial: 89 mmHg (ref 83.0–108.0)
pO2, Arterial: 123 mmHg — ABNORMAL HIGH (ref 83.0–108.0)
pO2, Arterial: 158 mmHg — ABNORMAL HIGH (ref 83.0–108.0)

## 2021-10-27 LAB — URINALYSIS, COMPLETE (UACMP) WITH MICROSCOPIC
Bilirubin Urine: NEGATIVE
Bilirubin Urine: NEGATIVE
Glucose, UA: NEGATIVE mg/dL
Glucose, UA: NEGATIVE mg/dL
Ketones, ur: NEGATIVE mg/dL
Ketones, ur: NEGATIVE mg/dL
Leukocytes,Ua: NEGATIVE
Leukocytes,Ua: NEGATIVE
Nitrite: NEGATIVE
Nitrite: NEGATIVE
Protein, ur: 30 mg/dL — AB
Protein, ur: 30 mg/dL — AB
Specific Gravity, Urine: 1.026 (ref 1.005–1.030)
Specific Gravity, Urine: 1.026 (ref 1.005–1.030)
pH: 5 (ref 5.0–8.0)
pH: 5 (ref 5.0–8.0)

## 2021-10-27 LAB — MRSA NEXT GEN BY PCR, NASAL: MRSA by PCR Next Gen: NOT DETECTED

## 2021-10-27 LAB — GLUCOSE, CAPILLARY
Glucose-Capillary: 205 mg/dL — ABNORMAL HIGH (ref 70–99)
Glucose-Capillary: 251 mg/dL — ABNORMAL HIGH (ref 70–99)
Glucose-Capillary: 232 mg/dL — ABNORMAL HIGH (ref 70–99)
Glucose-Capillary: 220 mg/dL — ABNORMAL HIGH (ref 70–99)
Glucose-Capillary: 205 mg/dL — ABNORMAL HIGH (ref 70–99)
Glucose-Capillary: 195 mg/dL — ABNORMAL HIGH (ref 70–99)
Glucose-Capillary: 167 mg/dL — ABNORMAL HIGH (ref 70–99)
Glucose-Capillary: 164 mg/dL — ABNORMAL HIGH (ref 70–99)

## 2021-10-27 LAB — CBC
HCT: 33.3 % — ABNORMAL LOW (ref 36.0–46.0)
HCT: 21.4 % — ABNORMAL LOW (ref 36.0–46.0)
HCT: 23.2 % — ABNORMAL LOW (ref 36.0–46.0)
Hemoglobin: 10.8 g/dL — ABNORMAL LOW (ref 12.0–15.0)
Hemoglobin: 6.6 g/dL — CL (ref 12.0–15.0)
Hemoglobin: 8 g/dL — ABNORMAL LOW (ref 12.0–15.0)
MCH: 30.9 pg (ref 26.0–34.0)
MCH: 29.3 pg (ref 26.0–34.0)
MCH: 31 pg (ref 26.0–34.0)
MCHC: 32.4 g/dL (ref 30.0–36.0)
MCHC: 30.8 g/dL (ref 30.0–36.0)
MCHC: 34.5 g/dL (ref 30.0–36.0)
MCV: 95.1 fL (ref 80.0–100.0)
MCV: 95.1 fL (ref 80.0–100.0)
MCV: 89.9 fL (ref 80.0–100.0)
Platelets: 306 10*3/uL (ref 150–400)
Platelets: 152 10*3/uL (ref 150–400)
Platelets: 156 10*3/uL (ref 150–400)
RBC: 3.5 MIL/uL — ABNORMAL LOW (ref 3.87–5.11)
RBC: 2.25 MIL/uL — ABNORMAL LOW (ref 3.87–5.11)
RBC: 2.58 MIL/uL — ABNORMAL LOW (ref 3.87–5.11)
RDW: 13.4 % (ref 11.5–15.5)
RDW: 15 % (ref 11.5–15.5)
RDW: 14.9 % (ref 11.5–15.5)
WBC: 10.7 10*3/uL — ABNORMAL HIGH (ref 4.0–10.5)
WBC: 10 10*3/uL (ref 4.0–10.5)
WBC: 8.6 10*3/uL (ref 4.0–10.5)
nRBC: 0.2 % (ref 0.0–0.2)
nRBC: 0.6 % — ABNORMAL HIGH (ref 0.0–0.2)
nRBC: 1.4 % — ABNORMAL HIGH (ref 0.0–0.2)

## 2021-10-27 LAB — BASIC METABOLIC PANEL
Anion gap: 14 (ref 5–15)
Anion gap: 11 (ref 5–15)
BUN: 56 mg/dL — ABNORMAL HIGH (ref 8–23)
BUN: 40 mg/dL — ABNORMAL HIGH (ref 8–23)
CO2: 21 mmol/L — ABNORMAL LOW (ref 22–32)
CO2: 24 mmol/L (ref 22–32)
Calcium: 8.4 mg/dL — ABNORMAL LOW (ref 8.9–10.3)
Calcium: 7.6 mg/dL — ABNORMAL LOW (ref 8.9–10.3)
Chloride: 104 mmol/L (ref 98–111)
Chloride: 106 mmol/L (ref 98–111)
Creatinine, Ser: 1.79 mg/dL — ABNORMAL HIGH (ref 0.44–1.00)
Creatinine, Ser: 1.21 mg/dL — ABNORMAL HIGH (ref 0.44–1.00)
GFR, Estimated: 30 mL/min — ABNORMAL LOW (ref >60)
GFR, Estimated: 48 mL/min — ABNORMAL LOW (ref >60)
Glucose, Bld: 137 mg/dL — ABNORMAL HIGH (ref 70–99)
Glucose, Bld: 170 mg/dL — ABNORMAL HIGH (ref 70–99)
Potassium: 4.3 mmol/L (ref 3.5–5.1)
Potassium: 3.5 mmol/L (ref 3.5–5.1)
Sodium: 139 mmol/L (ref 135–145)
Sodium: 141 mmol/L (ref 135–145)

## 2021-10-27 LAB — PLATELET COUNT: Platelets: 155 10*3/uL (ref 150–400)

## 2021-10-27 LAB — ECHOCARDIOGRAM LIMITED
AV Mean grad: 9 mmHg
AV Peak grad: 18.3 mmHg
Ao pk vel: 2.14 m/s
Height: 59 in
S' Lateral: 3.2 cm
Weight: 3880.1 oz

## 2021-10-27 LAB — COOXEMETRY PANEL
Carboxyhemoglobin: 1.1 % (ref 0.5–1.5)
Methemoglobin: 1.4 % (ref 0.0–1.5)
O2 Saturation: 47.6 %
Total hemoglobin: 6.4 g/dL — CL (ref 12.0–16.0)

## 2021-10-27 LAB — MAGNESIUM: Magnesium: 3 mg/dL — ABNORMAL HIGH (ref 1.7–2.4)

## 2021-10-27 LAB — PREPARE RBC (CROSSMATCH)

## 2021-10-27 LAB — SURGICAL PCR SCREEN
MRSA, PCR: NEGATIVE
Staphylococcus aureus: NEGATIVE

## 2021-10-27 LAB — HEMOGLOBIN AND HEMATOCRIT, BLOOD
HCT: 22.6 % — ABNORMAL LOW (ref 36.0–46.0)
Hemoglobin: 7.8 g/dL — ABNORMAL LOW (ref 12.0–15.0)

## 2021-10-27 LAB — ECHO INTRAOPERATIVE TEE
Height: 59 in
Weight: 3880.1 oz

## 2021-10-27 LAB — PROTIME-INR
INR: 1.7 — ABNORMAL HIGH (ref 0.8–1.2)
Prothrombin Time: 20 seconds — ABNORMAL HIGH (ref 11.4–15.2)

## 2021-10-27 LAB — APTT
aPTT: 74 seconds — ABNORMAL HIGH (ref 24–36)
aPTT: 24 seconds (ref 24–36)

## 2021-10-27 SURGERY — CORONARY ARTERY BYPASS GRAFTING (CABG)
Anesthesia: General | Site: Leg Lower | Laterality: Right

## 2021-10-27 MED ORDER — MIDAZOLAM HCL (PF) 10 MG/2ML IJ SOLN
INTRAMUSCULAR | Status: AC
  Filled 2021-10-27: qty 2

## 2021-10-27 MED ORDER — SODIUM CHLORIDE 0.9% FLUSH
3.0000 mL | Freq: Two times a day (BID) | INTRAVENOUS | Status: DC
  Administered 2021-10-28 – 2021-11-02 (×8): 3 mL via INTRAVENOUS
  Administered 2021-11-02: 10 mL via INTRAVENOUS
  Administered 2021-11-03 – 2021-11-10 (×11): 3 mL via INTRAVENOUS

## 2021-10-27 MED ORDER — SODIUM CHLORIDE 0.9 % IV SOLN: INTRAVENOUS | Status: DC

## 2021-10-27 MED ORDER — SURGIFLO WITH THROMBIN (HEMOSTATIC MATRIX KIT) OPTIME
TOPICAL | Status: DC | PRN
  Administered 2021-10-27 (×3): 1 via TOPICAL

## 2021-10-27 MED ORDER — LACTATED RINGERS IV SOLN: INTRAVENOUS | Status: DC | PRN

## 2021-10-27 MED ORDER — HEPARIN SODIUM (PORCINE) 1000 UNIT/ML IJ SOLN
INTRAMUSCULAR | Status: DC | PRN
  Administered 2021-10-27: 44000 [IU] via INTRAVENOUS

## 2021-10-27 MED ORDER — FENTANYL CITRATE (PF) 250 MCG/5ML IJ SOLN
INTRAMUSCULAR | Status: AC
  Filled 2021-10-27: qty 5

## 2021-10-27 MED ORDER — VANCOMYCIN HCL IN DEXTROSE 1-5 GM/200ML-% IV SOLN
1000.0000 mg | Freq: Once | INTRAVENOUS | Status: AC
  Administered 2021-10-27: 1000 mg via INTRAVENOUS
  Filled 2021-10-27: qty 200

## 2021-10-27 MED ORDER — METOCLOPRAMIDE HCL 5 MG/ML IJ SOLN: 10.0000 mg | Freq: Four times a day (QID) | INTRAMUSCULAR | Status: DC

## 2021-10-27 MED ORDER — METOPROLOL TARTRATE 5 MG/5ML IV SOLN: 2.5000 mg | INTRAVENOUS | Status: DC | PRN

## 2021-10-27 MED ORDER — HEMOSTATIC AGENTS (NO CHARGE) OPTIME
TOPICAL | Status: DC | PRN
  Administered 2021-10-27 (×2): 1 via TOPICAL

## 2021-10-27 MED ORDER — LACTATED RINGERS IV SOLN: 500.0000 mL | Freq: Once | INTRAVENOUS | Status: DC | PRN

## 2021-10-27 MED ORDER — NOREPINEPHRINE 4 MG/250ML-% IV SOLN
0.0000 ug/min | INTRAVENOUS | Status: DC
  Administered 2021-10-27: 5 ug/min via INTRAVENOUS

## 2021-10-27 MED ORDER — ONDANSETRON HCL 4 MG/2ML IJ SOLN
4.0000 mg | Freq: Four times a day (QID) | INTRAMUSCULAR | Status: DC | PRN
  Filled 2021-10-27: qty 2

## 2021-10-27 MED ORDER — METOCLOPRAMIDE HCL 5 MG/ML IJ SOLN: 10.0000 mg | Freq: Once | INTRAMUSCULAR | Status: DC

## 2021-10-27 MED ORDER — CALCIUM CHLORIDE 10 % IV SOLN
INTRAVENOUS | Status: DC | PRN
Start: 2021-10-27 — End: 2021-10-27
  Administered 2021-10-27: 200 mg via INTRAVENOUS

## 2021-10-27 MED ORDER — PHENYLEPHRINE HCL-NACL 20-0.9 MG/250ML-% IV SOLN: 0.0000 ug/min | INTRAVENOUS | Status: DC

## 2021-10-27 MED ORDER — FAMOTIDINE IN NACL 20-0.9 MG/50ML-% IV SOLN
20.0000 mg | Freq: Two times a day (BID) | INTRAVENOUS | Status: AC
  Administered 2021-10-27 (×2): 20 mg via INTRAVENOUS
  Filled 2021-10-27 (×2): qty 50

## 2021-10-27 MED ORDER — SODIUM CHLORIDE 0.9% IV SOLUTION: Freq: Once | INTRAVENOUS | Status: AC

## 2021-10-27 MED ORDER — DEXMEDETOMIDINE HCL IN NACL 400 MCG/100ML IV SOLN
0.0000 ug/kg/h | INTRAVENOUS | Status: DC
  Administered 2021-10-27 – 2021-10-28 (×3): 0.7 ug/kg/h via INTRAVENOUS
  Filled 2021-10-27 (×3): qty 100

## 2021-10-27 MED ORDER — METOPROLOL TARTRATE 25 MG/10 ML ORAL SUSPENSION: 12.5000 mg | Freq: Two times a day (BID) | ORAL | Status: DC

## 2021-10-27 MED ORDER — ACETAMINOPHEN 500 MG PO TABS: 1000.0000 mg | ORAL_TABLET | Freq: Four times a day (QID) | ORAL | Status: AC

## 2021-10-27 MED ORDER — ORAL CARE MOUTH RINSE: 15.0000 mL | OROMUCOSAL | Status: DC

## 2021-10-27 MED ORDER — CEFAZOLIN SODIUM-DEXTROSE 2-4 GM/100ML-% IV SOLN
2.0000 g | Freq: Three times a day (TID) | INTRAVENOUS | Status: AC
  Administered 2021-10-27 – 2021-10-29 (×6): 2 g via INTRAVENOUS
  Filled 2021-10-27 (×5): qty 100

## 2021-10-27 MED ORDER — DEXTROSE 50 % IV SOLN: 0.0000 mL | INTRAVENOUS | Status: DC | PRN

## 2021-10-27 MED ORDER — SODIUM CHLORIDE (PF) 0.9 % IJ SOLN
OROMUCOSAL | Status: DC | PRN
  Administered 2021-10-27 (×8): 4 mL via TOPICAL

## 2021-10-27 MED ORDER — TRAMADOL HCL 50 MG PO TABS: 50.0000 mg | ORAL_TABLET | ORAL | Status: DC | PRN

## 2021-10-27 MED ORDER — ACETAMINOPHEN 650 MG RE SUPP
650.0000 mg | Freq: Once | RECTAL | Status: AC
  Administered 2021-10-27: 650 mg via RECTAL

## 2021-10-27 MED ORDER — PROPOFOL 10 MG/ML IV BOLUS
INTRAVENOUS | Status: AC
  Filled 2021-10-27: qty 20

## 2021-10-27 MED ORDER — PROTAMINE SULFATE 10 MG/ML IV SOLN
INTRAVENOUS | Status: DC | PRN
  Administered 2021-10-27: 400 mg via INTRAVENOUS

## 2021-10-27 MED ORDER — ACETAMINOPHEN 160 MG/5ML PO SOLN
1000.0000 mg | Freq: Four times a day (QID) | ORAL | Status: AC
  Administered 2021-10-28 – 2021-11-01 (×19): 1000 mg
  Filled 2021-10-27 (×17): qty 40.6

## 2021-10-27 MED ORDER — BISACODYL 5 MG PO TBEC: 10.0000 mg | DELAYED_RELEASE_TABLET | Freq: Every day | ORAL | Status: DC

## 2021-10-27 MED ORDER — NITROGLYCERIN IN D5W 200-5 MCG/ML-% IV SOLN: 0.0000 ug/min | INTRAVENOUS | Status: DC

## 2021-10-27 MED ORDER — MORPHINE SULFATE (PF) 2 MG/ML IV SOLN: 0.5000 mg | Freq: Four times a day (QID) | INTRAVENOUS | Status: DC | PRN

## 2021-10-27 MED ORDER — SODIUM CHLORIDE 0.9% IV SOLUTION: Freq: Once | INTRAVENOUS | Status: DC

## 2021-10-27 MED ORDER — CHLORHEXIDINE GLUCONATE CLOTH 2 % EX PADS
6.0000 | MEDICATED_PAD | Freq: Every day | CUTANEOUS | Status: DC
  Administered 2021-10-28 – 2021-11-11 (×14): 6 via TOPICAL

## 2021-10-27 MED ORDER — ALBUMIN HUMAN 5 % IV SOLN
250.0000 mL | INTRAVENOUS | Status: AC | PRN
  Administered 2021-10-27: 12.5 g via INTRAVENOUS
  Filled 2021-10-27: qty 250

## 2021-10-27 MED ORDER — SODIUM BICARBONATE 8.4 % IV SOLN
INTRAVENOUS | Status: AC
  Administered 2021-10-27: 50 meq
  Filled 2021-10-27: qty 50

## 2021-10-27 MED ORDER — SODIUM BICARBONATE 8.4 % IV SOLN
50.0000 meq | Freq: Once | INTRAVENOUS | Status: AC
  Administered 2021-10-27: 50 meq via INTRAVENOUS

## 2021-10-27 MED ORDER — SODIUM CHLORIDE 0.9% FLUSH: 3.0000 mL | INTRAVENOUS | Status: DC | PRN

## 2021-10-27 MED ORDER — HYDROMORPHONE HCL 1 MG/ML IJ SOLN
1.0000 mg | INTRAMUSCULAR | Status: DC | PRN
  Administered 2021-10-27: 1 mg via INTRAVENOUS
  Filled 2021-10-27: qty 1

## 2021-10-27 MED ORDER — SODIUM CHLORIDE 0.9 % IV SOLN: INTRAVENOUS | Status: DC | PRN

## 2021-10-27 MED ORDER — AMIODARONE HCL IN DEXTROSE 360-4.14 MG/200ML-% IV SOLN
INTRAVENOUS | Status: DC | PRN
  Administered 2021-10-27: 30 mg/h via INTRAVENOUS

## 2021-10-27 MED ORDER — ACETAMINOPHEN 160 MG/5ML PO SOLN: 650.0000 mg | Freq: Once | ORAL | Status: AC

## 2021-10-27 MED ORDER — NOREPINEPHRINE 16 MG/250ML-% IV SOLN
0.0000 ug/min | INTRAVENOUS | Status: DC
  Administered 2021-10-27: 25 ug/min via INTRAVENOUS
  Administered 2021-10-28: 10 ug/min via INTRAVENOUS
  Administered 2021-10-29: 28 ug/min via INTRAVENOUS
  Administered 2021-10-31: 14 ug/min via INTRAVENOUS
  Administered 2021-11-01: 6 ug/min via INTRAVENOUS
  Filled 2021-10-27 (×5): qty 250

## 2021-10-27 MED ORDER — MILRINONE LACTATE IN DEXTROSE 20-5 MG/100ML-% IV SOLN
0.2000 ug/kg/min | INTRAVENOUS | Status: DC
  Administered 2021-10-27: 22:00:00 0.25 ug/kg/min via INTRAVENOUS
  Administered 2021-10-28 – 2021-11-01 (×11): 0.3 ug/kg/min via INTRAVENOUS
  Administered 2021-11-02: 0.2 ug/kg/min via INTRAVENOUS
  Filled 2021-10-27 (×2): qty 100
  Filled 2021-10-27: qty 300
  Filled 2021-10-27 (×10): qty 100

## 2021-10-27 MED ORDER — LACTATED RINGERS IV SOLN: INTRAVENOUS | Status: DC

## 2021-10-27 MED ORDER — CHLORHEXIDINE GLUCONATE 0.12% ORAL RINSE (MEDLINE KIT)
15.0000 mL | Freq: Two times a day (BID) | OROMUCOSAL | Status: DC
  Administered 2021-10-27 – 2021-11-11 (×24): 15 mL via OROMUCOSAL

## 2021-10-27 MED ORDER — PHENYLEPHRINE 40 MCG/ML (10ML) SYRINGE FOR IV PUSH (FOR BLOOD PRESSURE SUPPORT)
PREFILLED_SYRINGE | INTRAVENOUS | Status: DC | PRN
  Administered 2021-10-27: 80 ug via INTRAVENOUS
  Administered 2021-10-27: 120 ug via INTRAVENOUS
  Administered 2021-10-27 (×2): 80 ug via INTRAVENOUS

## 2021-10-27 MED ORDER — MORPHINE SULFATE (PF) 2 MG/ML IV SOLN
1.0000 mg | INTRAVENOUS | Status: DC | PRN
  Administered 2021-10-27 – 2021-10-28 (×7): 2 mg via INTRAVENOUS
  Administered 2021-10-29 (×2): 4 mg via INTRAVENOUS
  Administered 2021-10-30: 2 mg via INTRAVENOUS
  Administered 2021-10-30: 4 mg via INTRAVENOUS
  Administered 2021-10-30 (×3): 2 mg via INTRAVENOUS
  Filled 2021-10-27 (×2): qty 1
  Filled 2021-10-27: qty 2
  Filled 2021-10-27: qty 1
  Filled 2021-10-27: qty 2
  Filled 2021-10-27 (×5): qty 1
  Filled 2021-10-27: qty 2
  Filled 2021-10-27 (×3): qty 1

## 2021-10-27 MED ORDER — POTASSIUM CHLORIDE 10 MEQ/50ML IV SOLN
10.0000 meq | INTRAVENOUS | Status: AC
  Administered 2021-10-27 – 2021-10-28 (×3): 10 meq via INTRAVENOUS
  Filled 2021-10-27: qty 50

## 2021-10-27 MED ORDER — EPINEPHRINE HCL 5 MG/250ML IV SOLN IN NS
0.0000 ug/min | INTRAVENOUS | Status: DC
  Administered 2021-10-27: 18 ug/min via INTRAVENOUS
  Filled 2021-10-27 (×2): qty 250

## 2021-10-27 MED ORDER — ROCURONIUM BROMIDE 10 MG/ML (PF) SYRINGE
PREFILLED_SYRINGE | INTRAVENOUS | Status: DC | PRN
Start: 2021-10-27 — End: 2021-10-27
  Administered 2021-10-27: 50 mg via INTRAVENOUS
  Administered 2021-10-27: 30 mg via INTRAVENOUS
  Administered 2021-10-27: 50 mg via INTRAVENOUS
  Administered 2021-10-27: 100 mg via INTRAVENOUS
  Administered 2021-10-27: 50 mg via INTRAVENOUS

## 2021-10-27 MED ORDER — AMIODARONE LOAD VIA INFUSION
150.0000 mg | Freq: Once | INTRAVENOUS | Status: AC
  Administered 2021-10-27: 150 mg via INTRAVENOUS
  Filled 2021-10-27: qty 83.34

## 2021-10-27 MED ORDER — EPINEPHRINE 1 MG/10ML IJ SOSY
PREFILLED_SYRINGE | INTRAMUSCULAR | Status: DC | PRN
  Administered 2021-10-27: 100 ug via INTRAVENOUS
  Administered 2021-10-27: 900 ug via INTRAVENOUS
  Administered 2021-10-27: 100 ug via INTRAVENOUS
  Administered 2021-10-27: 20 ug via INTRAVENOUS
  Administered 2021-10-27: 50 ug via INTRAVENOUS
  Administered 2021-10-27 (×2): 20 ug via INTRAVENOUS
  Administered 2021-10-27: 40 ug via INTRAVENOUS
  Administered 2021-10-27: .9 mg via INTRAVENOUS
  Administered 2021-10-27: 50 ug via INTRAVENOUS
  Administered 2021-10-27: 20 ug via INTRAVENOUS
  Administered 2021-10-27: 100 ug via INTRAVENOUS

## 2021-10-27 MED ORDER — POTASSIUM CHLORIDE 10 MEQ/50ML IV SOLN
10.0000 meq | INTRAVENOUS | Status: AC
  Administered 2021-10-27 (×2): 10 meq via INTRAVENOUS

## 2021-10-27 MED ORDER — ALBUMIN HUMAN 5 % IV SOLN: INTRAVENOUS | Status: DC | PRN

## 2021-10-27 MED ORDER — PROPOFOL 10 MG/ML IV BOLUS
INTRAVENOUS | Status: DC | PRN
Start: 2021-10-27 — End: 2021-10-27
  Administered 2021-10-27: 10 mg via INTRAVENOUS

## 2021-10-27 MED ORDER — FENTANYL CITRATE (PF) 250 MCG/5ML IJ SOLN
INTRAMUSCULAR | Status: DC | PRN
Start: 2021-10-27 — End: 2021-10-27
  Administered 2021-10-27 (×3): 100 ug via INTRAVENOUS
  Administered 2021-10-27 (×2): 50 ug via INTRAVENOUS
  Administered 2021-10-27: 100 ug via INTRAVENOUS

## 2021-10-27 MED ORDER — MIDAZOLAM HCL (PF) 5 MG/ML IJ SOLN
INTRAMUSCULAR | Status: DC | PRN
  Administered 2021-10-27: 6 mg via INTRAVENOUS
  Administered 2021-10-27: 2 mg via INTRAVENOUS

## 2021-10-27 MED ORDER — LIDOCAINE 2% (20 MG/ML) 5 ML SYRINGE
INTRAMUSCULAR | Status: DC | PRN
Start: 2021-10-27 — End: 2021-10-27
  Administered 2021-10-27: 60 mg via INTRAVENOUS
  Administered 2021-10-27: 100 mg via INTRAVENOUS

## 2021-10-27 MED ORDER — SODIUM CHLORIDE 0.9 % IV SOLN
20.0000 ug | Freq: Once | INTRAVENOUS | Status: AC
  Administered 2021-10-27: 20 ug via INTRAVENOUS
  Filled 2021-10-27: qty 5

## 2021-10-27 MED ORDER — SODIUM CHLORIDE 0.45 % IV SOLN: INTRAVENOUS | Status: DC | PRN

## 2021-10-27 MED ORDER — OXYCODONE HCL 5 MG PO TABS
5.0000 mg | ORAL_TABLET | ORAL | Status: DC | PRN
  Administered 2021-10-28 – 2021-10-30 (×7): 10 mg via ORAL
  Filled 2021-10-27 (×7): qty 2

## 2021-10-27 MED ORDER — METOPROLOL TARTRATE 12.5 MG HALF TABLET: 12.5000 mg | ORAL_TABLET | Freq: Two times a day (BID) | ORAL | Status: DC

## 2021-10-27 MED ORDER — PANTOPRAZOLE SODIUM 40 MG PO TBEC: 40.0000 mg | DELAYED_RELEASE_TABLET | Freq: Every day | ORAL | Status: DC

## 2021-10-27 MED ORDER — METOCLOPRAMIDE HCL 5 MG/ML IJ SOLN
10.0000 mg | Freq: Four times a day (QID) | INTRAMUSCULAR | Status: DC
  Administered 2021-10-27 – 2021-10-30 (×10): 10 mg via INTRAVENOUS
  Filled 2021-10-27 (×9): qty 2

## 2021-10-27 MED ORDER — MIDAZOLAM HCL 2 MG/2ML IJ SOLN
2.0000 mg | INTRAMUSCULAR | Status: DC | PRN
  Administered 2021-10-27 – 2021-10-30 (×5): 2 mg via INTRAVENOUS
  Filled 2021-10-27 (×5): qty 2

## 2021-10-27 MED ORDER — ASPIRIN EC 325 MG PO TBEC: 325.0000 mg | DELAYED_RELEASE_TABLET | Freq: Every day | ORAL | Status: DC

## 2021-10-27 MED ORDER — SODIUM CHLORIDE 0.9 % IV SOLN
0.0000 ug/min | INTRAVENOUS | Status: DC
  Administered 2021-10-27 – 2021-10-28 (×2): 18 ug/min via INTRAVENOUS
  Administered 2021-10-28: 16 ug/min via INTRAVENOUS
  Administered 2021-10-29: 8 ug/min via INTRAVENOUS
  Filled 2021-10-27 (×5): qty 10

## 2021-10-27 MED ORDER — BISACODYL 10 MG RE SUPP: 10.0000 mg | Freq: Every day | RECTAL | Status: DC

## 2021-10-27 MED ORDER — CHLORHEXIDINE GLUCONATE 0.12 % MT SOLN
15.0000 mL | OROMUCOSAL | Status: AC
  Administered 2021-10-27: 15 mL via OROMUCOSAL

## 2021-10-27 MED ORDER — SODIUM CHLORIDE 0.9 % IV SOLN
250.0000 mL | INTRAVENOUS | Status: DC
  Administered 2021-10-28: 250 mL via INTRAVENOUS

## 2021-10-27 MED ORDER — ASPIRIN 81 MG PO CHEW
324.0000 mg | CHEWABLE_TABLET | Freq: Every day | ORAL | Status: DC
  Administered 2021-10-28 – 2021-10-30 (×3): 324 mg
  Filled 2021-10-27 (×3): qty 4

## 2021-10-27 MED ORDER — 0.9 % SODIUM CHLORIDE (POUR BTL) OPTIME
TOPICAL | Status: DC | PRN
  Administered 2021-10-27: 5000 mL

## 2021-10-27 MED ORDER — INSULIN REGULAR(HUMAN) IN NACL 100-0.9 UT/100ML-% IV SOLN
INTRAVENOUS | Status: DC
  Administered 2021-10-27: 11.5 [IU]/h via INTRAVENOUS
  Filled 2021-10-27: qty 100

## 2021-10-27 MED ORDER — NOREPINEPHRINE 4 MG/250ML-% IV SOLN
0.0000 ug/min | INTRAVENOUS | Status: DC
  Administered 2021-10-27 (×2): 25 ug/min via INTRAVENOUS
  Filled 2021-10-27 (×2): qty 250

## 2021-10-27 MED ORDER — HYDROCORTISONE SOD SUC (PF) 100 MG IJ SOLR
INTRAMUSCULAR | Status: DC | PRN
  Administered 2021-10-27: 250 mg via INTRAVENOUS

## 2021-10-27 MED ORDER — MAGNESIUM SULFATE 4 GM/100ML IV SOLN
4.0000 g | Freq: Once | INTRAVENOUS | Status: AC
  Administered 2021-10-27: 4 g via INTRAVENOUS
  Filled 2021-10-27: qty 100

## 2021-10-27 MED ORDER — DOCUSATE SODIUM 100 MG PO CAPS: 200.0000 mg | ORAL_CAPSULE | Freq: Every day | ORAL | Status: DC

## 2021-10-27 MED ORDER — SODIUM BICARBONATE 8.4 % IV SOLN: 50.0000 meq | Freq: Once | INTRAVENOUS | Status: AC

## 2021-10-27 MED ORDER — ORAL CARE MOUTH RINSE
15.0000 mL | OROMUCOSAL | Status: DC
  Administered 2021-10-27 – 2021-11-04 (×53): 15 mL via OROMUCOSAL

## 2021-10-27 SURGICAL SUPPLY — 116 items
ADAPTER CARDIO PERF ANTE/RETRO (ADAPTER) ×5 IMPLANT
ADH SKN CLS APL DERMABOND .7 (GAUZE/BANDAGES/DRESSINGS) ×4
ADPR PRFSN 84XANTGRD RTRGD (ADAPTER) ×4
AGENT HMST KT MTR STRL THRMB (HEMOSTASIS) ×8
BAG DECANTER FOR FLEXI CONT (MISCELLANEOUS) ×5 IMPLANT
BLADE CLIPPER SURG (BLADE) ×5 IMPLANT
BLADE NDL 3 SS STRL (BLADE) IMPLANT
BLADE NEEDLE 3 SS STRL (BLADE) ×5 IMPLANT
BLADE STERNUM SYSTEM 6 (BLADE) ×5 IMPLANT
BLADE SURG 12 STRL SS (BLADE) ×5 IMPLANT
BNDG CMPR MED 10X6 ELC LF (GAUZE/BANDAGES/DRESSINGS) ×4
BNDG ELASTIC 4X5.8 VLCR STR LF (GAUZE/BANDAGES/DRESSINGS) ×5 IMPLANT
BNDG ELASTIC 6X10 VLCR STRL LF (GAUZE/BANDAGES/DRESSINGS) ×1 IMPLANT
BNDG ELASTIC 6X5.8 VLCR STR LF (GAUZE/BANDAGES/DRESSINGS) ×5 IMPLANT
BNDG GAUZE ELAST 4 BULKY (GAUZE/BANDAGES/DRESSINGS) ×5 IMPLANT
CANISTER SUCT 3000ML PPV (MISCELLANEOUS) ×5 IMPLANT
CANNULA GUNDRY RCSP 15FR (MISCELLANEOUS) ×5 IMPLANT
CATH CPB KIT VANTRIGT (MISCELLANEOUS) ×5 IMPLANT
CATH ROBINSON RED A/P 18FR (CATHETERS) ×15 IMPLANT
CATH THORACIC 28FR RT ANG (CATHETERS) ×5 IMPLANT
CONN ST 1/4X3/8  BEN (MISCELLANEOUS) ×5
CONN ST 1/4X3/8 BEN (MISCELLANEOUS) IMPLANT
CONTAINER PROTECT SURGISLUSH (MISCELLANEOUS) ×10 IMPLANT
CRYO VEIN SAPHENOUS (Tissue) ×5 IMPLANT
DERMABOND ADVANCED (GAUZE/BANDAGES/DRESSINGS) ×1
DERMABOND ADVANCED .7 DNX12 (GAUZE/BANDAGES/DRESSINGS) IMPLANT
DRAIN CHANNEL 15F RND FF W/TCR (WOUND CARE) ×1 IMPLANT
DRAIN CHANNEL 32F RND 10.7 FF (WOUND CARE) ×5 IMPLANT
DRAPE CARDIOVASCULAR INCISE (DRAPES) ×5
DRAPE HALF SHEET 40X57 (DRAPES) ×1 IMPLANT
DRAPE SLUSH/WARMER DISC (DRAPES) ×5 IMPLANT
DRAPE SRG 135X102X78XABS (DRAPES) ×4 IMPLANT
DRSG AQUACEL AG ADV 3.5X14 (GAUZE/BANDAGES/DRESSINGS) ×5 IMPLANT
ELECT BLADE 4.0 EZ CLEAN MEGAD (MISCELLANEOUS) ×5
ELECT BLADE 6.5 EXT (BLADE) ×5 IMPLANT
ELECT CAUTERY BLADE 6.4 (BLADE) ×5 IMPLANT
ELECT REM PT RETURN 9FT ADLT (ELECTROSURGICAL) ×10
ELECTRODE BLDE 4.0 EZ CLN MEGD (MISCELLANEOUS) ×4 IMPLANT
ELECTRODE REM PT RTRN 9FT ADLT (ELECTROSURGICAL) ×8 IMPLANT
EVACUATOR SILICONE 100CC (DRAIN) ×1 IMPLANT
FELT TEFLON 1X6 (MISCELLANEOUS) ×9 IMPLANT
GAUZE 4X4 16PLY ~~LOC~~+RFID DBL (SPONGE) ×6 IMPLANT
GAUZE SPONGE 4X4 12PLY STRL (GAUZE/BANDAGES/DRESSINGS) ×11 IMPLANT
GLOVE SURG ENC MOIS LTX SZ7.5 (GLOVE) ×15 IMPLANT
GLOVE SURG MICRO LTX SZ6 (GLOVE) ×1 IMPLANT
GLOVE SURG MICRO LTX SZ6.5 (GLOVE) ×3 IMPLANT
GLOVE SURG MICRO LTX SZ7 (GLOVE) ×2 IMPLANT
GLOVE SURG MICRO LTX SZ7.5 (GLOVE) ×2 IMPLANT
GLOVE SURG PROTEXIS BL SZ6.5 (GLOVE) ×10 IMPLANT
GLOVE SURG SYN 6.5 PF PI BL (GLOVE) IMPLANT
GOWN STRL REUS W/ TWL LRG LVL3 (GOWN DISPOSABLE) ×16 IMPLANT
GOWN STRL REUS W/TWL LRG LVL3 (GOWN DISPOSABLE) ×30
GRAFT VASC ANGIOGRAFT 71-80 (Tissue) IMPLANT
HEMOSTAT POWDER SURGIFOAM 1G (HEMOSTASIS) ×20 IMPLANT
HEMOSTAT SURGICEL 2X14 (HEMOSTASIS) ×5 IMPLANT
INSERT FOGARTY XLG (MISCELLANEOUS) IMPLANT
KIT BASIN OR (CUSTOM PROCEDURE TRAY) ×5 IMPLANT
KIT SUCTION CATH 14FR (SUCTIONS) ×5 IMPLANT
KIT TURNOVER KIT B (KITS) ×5 IMPLANT
KIT VASOVIEW HEMOPRO 2 VH 4000 (KITS) ×5 IMPLANT
LEAD PACING MYOCARDI (MISCELLANEOUS) ×6 IMPLANT
MARKER GRAFT CORONARY BYPASS (MISCELLANEOUS) ×15 IMPLANT
NS IRRIG 1000ML POUR BTL (IV SOLUTION) ×25 IMPLANT
PACK E OPEN HEART (SUTURE) ×5 IMPLANT
PACK OPEN HEART (CUSTOM PROCEDURE TRAY) ×5 IMPLANT
PAD ARMBOARD 7.5X6 YLW CONV (MISCELLANEOUS) ×10 IMPLANT
PAD ELECT DEFIB RADIOL ZOLL (MISCELLANEOUS) ×5 IMPLANT
PENCIL BUTTON HOLSTER BLD 10FT (ELECTRODE) ×5 IMPLANT
POSITIONER HEAD DONUT 9IN (MISCELLANEOUS) ×5 IMPLANT
POWDER SURGICEL 3.0 GRAM (HEMOSTASIS) ×5 IMPLANT
PUNCH AORTIC ROTATE 4.0MM (MISCELLANEOUS) IMPLANT
PUNCH AORTIC ROTATE 4.5MM 8IN (MISCELLANEOUS) ×1 IMPLANT
PUNCH AORTIC ROTATE 5MM 8IN (MISCELLANEOUS) IMPLANT
SET MPS 3-ND DEL (MISCELLANEOUS) ×1 IMPLANT
SPONGE T-LAP 18X18 ~~LOC~~+RFID (SPONGE) ×28 IMPLANT
SPONGE T-LAP 4X18 ~~LOC~~+RFID (SPONGE) ×6 IMPLANT
SUPPORT HEART JANKE-BARRON (MISCELLANEOUS) ×5 IMPLANT
SURGIFLO W/THROMBIN 8M KIT (HEMOSTASIS) ×6 IMPLANT
SUT BONE WAX W31G (SUTURE) ×5 IMPLANT
SUT ETHILON 3 0 PS 1 (SUTURE) ×2 IMPLANT
SUT MNCRL AB 3-0 PS2 18 (SUTURE) ×1 IMPLANT
SUT MNCRL AB 4-0 PS2 18 (SUTURE) IMPLANT
SUT PROLENE 3 0 SH DA (SUTURE) IMPLANT
SUT PROLENE 3 0 SH1 36 (SUTURE) IMPLANT
SUT PROLENE 4 0 RB 1 (SUTURE) ×5
SUT PROLENE 4 0 SH DA (SUTURE) ×5 IMPLANT
SUT PROLENE 4-0 RB1 .5 CRCL 36 (SUTURE) ×4 IMPLANT
SUT PROLENE 5 0 C 1 36 (SUTURE) ×1 IMPLANT
SUT PROLENE 6 0 C 1 30 (SUTURE) ×8 IMPLANT
SUT PROLENE 6 0 CC (SUTURE) ×16 IMPLANT
SUT PROLENE 8 0 BV175 6 (SUTURE) IMPLANT
SUT PROLENE BLUE 7 0 (SUTURE) ×6 IMPLANT
SUT SILK  1 MH (SUTURE) ×5
SUT SILK 1 MH (SUTURE) IMPLANT
SUT SILK 2 0 SH CR/8 (SUTURE) ×1 IMPLANT
SUT SILK 3 0 SH CR/8 (SUTURE) ×1 IMPLANT
SUT STEEL 6MS V (SUTURE) ×10 IMPLANT
SUT STEEL SZ 6 DBL 3X14 BALL (SUTURE) ×7 IMPLANT
SUT VIC AB 1 CTX 36 (SUTURE) ×25
SUT VIC AB 1 CTX36XBRD ANBCTR (SUTURE) ×8 IMPLANT
SUT VIC AB 2-0 CT1 27 (SUTURE) ×15
SUT VIC AB 2-0 CT1 TAPERPNT 27 (SUTURE) IMPLANT
SUT VIC AB 2-0 CTX 27 (SUTURE) IMPLANT
SUT VIC AB 3-0 X1 27 (SUTURE) ×1 IMPLANT
SYR 20ML LL LF (SYRINGE) ×1 IMPLANT
SYSTEM SAHARA CHEST DRAIN ATS (WOUND CARE) ×5 IMPLANT
TAPE CLOTH SURG 4X10 WHT LF (GAUZE/BANDAGES/DRESSINGS) ×1 IMPLANT
TAPE PAPER 2X10 WHT MICROPORE (GAUZE/BANDAGES/DRESSINGS) ×1 IMPLANT
TOWEL GREEN STERILE (TOWEL DISPOSABLE) ×5 IMPLANT
TOWEL GREEN STERILE FF (TOWEL DISPOSABLE) ×5 IMPLANT
TRAY FOLEY SLVR 16FR TEMP STAT (SET/KITS/TRAYS/PACK) ×5 IMPLANT
TUBING LAP HI FLOW INSUFFLATIO (TUBING) ×5 IMPLANT
UNDERPAD 30X36 HEAVY ABSORB (UNDERPADS AND DIAPERS) ×5 IMPLANT
VEIN 'CRYO SAPHENOUS (Tissue) ×4 IMPLANT
WATER STERILE IRR 1000ML POUR (IV SOLUTION) ×10 IMPLANT
YANKAUER SUCT BULB TIP NO VENT (SUCTIONS) ×2 IMPLANT

## 2021-10-27 NOTE — Transfer of Care (Signed)
Immediate Anesthesia Transfer of Care Note  Patient: Deborah Cooke  Procedure(s) Performed: CORONARY ARTERY BYPASS GRAFTING (CABG) TIMES FOUR, USING LEFT INTERNAL MAMMARY ARTERY AND RIGHT LEG GREATER SAPHENOUS VEIN HARVESTED ENDOSCOPICALLY AND RIGHT CORONARY THROMBECTOMY (Chest) TRANSESOPHAGEAL ECHOCARDIOGRAM (TEE) APPLICATION OF CELL SAVER ENDOVEIN HARVEST OF GREATER SAPHENOUS VEIN (Right: Leg Lower)  Patient Location: SICU  Anesthesia Type:General  Level of Consciousness: Patient remains intubated per anesthesia plan  Airway & Oxygen Therapy: Patient remains intubated per anesthesia plan  Post-op Assessment: report given to RN. Adjusted pressors. BP low. Surgeon at bedside.  Post vital signs: Reviewed  Last Vitals:  Vitals Value Taken Time  BP 75/46 10/27/21 1615  Temp 35.3 C 10/27/21 1626  Pulse 158 10/27/21 1626  Resp 18 10/27/21 1626  SpO2 96 % 10/27/21 1626  Vitals shown include unvalidated device data.  Last Pain:  Vitals:   10/27/21 0730  TempSrc:   PainSc: 2          Complications: No notable events documented.

## 2021-10-27 NOTE — Brief Op Note (Addendum)
10/26/2021 - 10/27/2021  2:19 PM  PATIENT:  Deborah Cooke  71 y.o. female  PRE-OPERATIVE DIAGNOSIS:  Coronary Disease-Left Main  POST-OPERATIVE DIAGNOSIS:  Coronary Disease-Left Main  PROCEDURES:   CORONARY ARTERY BYPASS GRAFTING (CABG) X FOUR, USING CRYO VEIN AND RIGHT LEG GREATER SAPHENOUS VEIN HARVESTED ENDOSCOPICALLY and with OPEN TECHNIQUE   SVG-LAD SVG-OM1-OM2 (sequential) SVG-PD  Vein harvest time:  23min           Vein prep time:   74min  TRANSESOPHAGEAL ECHOCARDIOGRAM   APPLICATION OF CELL SAVER  ENDOVEIN HARVEST OF GREATER SAPHENOUS VEIN (Rght)  SURGEON:  Dahlia Byes, MD - Primary  PHYSICIAN ASSISTANT: Roddenberry  ASSISTANTS: Sammie Bench, RN, RN First Assistant   ANESTHESIA:   general  EBL:  505 mL   BLOOD ADMINISTERED: PRBC's 311ml, Plt 251ml, FFP 558ml  DRAINS: mediastinal tubes x 2 and bilateral pleural tubes.  LOCAL MEDICATIONS USED:  NONE  SPECIMEN:  Source of Specimen:  organized thrombus from PL branch of RCA  DISPOSITION OF SPECIMEN:  PATHOLOGY  COUNTS:  Correct  DICTATION: .Dragon Dictation  PLAN OF CARE: Admit to inpatient   PATIENT DISPOSITION:  ICU - intubated and critically ill.   Delay start of Pharmacological VTE agent (>24hrs) due to surgical blood loss or risk of bleeding: yes

## 2021-10-27 NOTE — Progress Notes (Signed)
ANTICOAGULATION CONSULT NOTE - Follow Up Consult  Pharmacy Consult for bivalirudin Indication:  IABP  Labs: Recent Labs    10/26/21 2240 10/26/21 2310 10/27/21 0314  HGB 10.4* 10.2* 10.8*  HCT 31.6* 30.0* 33.3*  PLT 320  --  306  APTT 73*  --  74*  LABPROT 24.4*  --   --   INR 2.2*  --   --   CREATININE 2.10*  --  1.79*  TROPONINIHS >24,000*  --   --     Assessment/Plan:  71yo female therapeutic on bivalirudin with initial dosing for IABP while awaiting urgent CABG. Will continue infusion at current rate of 0.25 mg/kg/hr until off at 6a.   Wynona Neat, PharmD, BCPS  10/27/2021,4:43 AM

## 2021-10-27 NOTE — Anesthesia Procedure Notes (Signed)
Arterial Line Insertion Start/End2/03/2022 7:57 AM, 10/27/2021 8:04 AM Performed by: Clearnce Sorrel, CRNA, CRNA  Patient location: OR. Preanesthetic checklist: patient identified, IV checked, site marked, risks and benefits discussed, surgical consent, monitors and equipment checked, pre-op evaluation, timeout performed and anesthesia consent Left, radial was placed Catheter size: 20 G Hand hygiene performed  and maximum sterile barriers used  Allen's test indicative of satisfactory collateral circulation Attempts: 1 Procedure performed without using ultrasound guided technique. Following insertion, dressing applied and Biopatch. Post procedure assessment: normal  Patient tolerated the procedure well with no immediate complications.

## 2021-10-27 NOTE — Progress Notes (Signed)
Pre Procedure note for inpatients:   Deborah Cooke has been scheduled for Procedure(s): Coronary/Graft Acute MI Revascularization (N/A) LEFT HEART CATH AND CORONARY ANGIOGRAPHY (N/A) IABP Insertion (N/A) TEMPORARY PACEMAKER (N/A) today. The various methods of treatment have been discussed with the patient. After consideration of the risks, benefits and treatment options the patient has consented to the planned procedure.   The patient has been seen and labs reviewed. There are no changes in the patients condition to prevent proceeding with the planned procedure today. Cath reviewed  by cardiology again for PCI and felt not feasible to treat with PCI due to large clot burden in the length of the RCA to the bifucation. Situation d/w patient and husband and they understand this is a high risk operation for major morbidity[organ failure] and death. Recent labs:  Lab Results  Component Value Date   WBC 10.7 (H) 10/27/2021   HGB 10.8 (L) 10/27/2021   HCT 33.3 (L) 10/27/2021   PLT 306 10/27/2021   GLUCOSE 137 (H) 10/27/2021   CHOL 154 10/26/2021   TRIG 145 10/26/2021   HDL 55 10/26/2021   LDLCALC 70 10/26/2021   ALT 115 (H) 10/26/2021   AST 204 (H) 10/26/2021   NA 139 10/27/2021   K 4.3 10/27/2021   CL 104 10/27/2021   CREATININE 1.79 (H) 10/27/2021   BUN 56 (H) 10/27/2021   CO2 21 (L) 10/27/2021   TSH 2.819 10/15/2020   INR 2.2 (H) 10/26/2021   HGBA1C 5.3 10/26/2021    Dahlia Byes, MD 10/27/2021 7:30 AM

## 2021-10-27 NOTE — Progress Notes (Addendum)
Upon getting patient settled in the ICU, pt went into Pulseless VT. Code Blue alarm pulled, CPR initiated. Dr. Darcey Nora at bedside and ordered 1 amp Lidocaine and 4mg  Mag. Following ACLS protocol, shocked delivered at 150j and achieved ROSC.   2250 Pulseless VT -> Approx 1 min CPR 2251 1 Shock 150j. ROSC obtained  2253 2254 1 amp Lidocaine  2255 - 4g Mag Sulfate

## 2021-10-27 NOTE — Progress Notes (Signed)
°  Echocardiogram 2D Echocardiogram has been performed.  Johny Chess 10/27/2021, 7:26 AM

## 2021-10-27 NOTE — Anesthesia Procedure Notes (Addendum)
Procedure Name: Intubation Date/Time: 10/27/2021 8:01 AM Performed by: Imagene Riches, CRNA Pre-anesthesia Checklist: Patient identified, Emergency Drugs available, Suction available and Patient being monitored Patient Re-evaluated:Patient Re-evaluated prior to induction Oxygen Delivery Method: Circle System Utilized Preoxygenation: Pre-oxygenation with 100% oxygen Induction Type: IV induction Ventilation: Mask ventilation without difficulty Laryngoscope Size: Miller and 2 Grade View: Grade I Tube type: Oral Tube size: 8.0 mm Number of attempts: 1 Airway Equipment and Method: Stylet and Oral airway Placement Confirmation: ETT inserted through vocal cords under direct vision, positive ETCO2 and breath sounds checked- equal and bilateral Secured at: 20 cm Tube secured with: Tape Dental Injury: Teeth and Oropharynx as per pre-operative assessment

## 2021-10-27 NOTE — Anesthesia Procedure Notes (Signed)
Central Venous Catheter Insertion Performed by: Audry Pili, MD, anesthesiologist Start/End2/03/2022 8:13 AM, 10/27/2021 8:15 AM Patient location: Pre-op. Preanesthetic checklist: patient identified, IV checked, risks and benefits discussed, surgical consent, monitors and equipment checked, pre-op evaluation, timeout performed and anesthesia consent Position: Trendelenburg Hand hygiene performed  and maximum sterile barriers used  Total catheter length 10. PA cath was placed.Swan type:thermodilution PA Cath depth:45 Procedure performed without using ultrasound guided technique. Attempts: 1 Patient tolerated the procedure well with no immediate complications.

## 2021-10-27 NOTE — Progress Notes (Signed)
Dr Humphrey Rolls called to the bedside to assess IABP. RN concerned for decreasing MAPs and Augmentation pressures. After reviewing x-ray, Dr Humphrey Rolls advanced IABP approx 3cm.

## 2021-10-27 NOTE — Progress Notes (Signed)
Interventional cardiology note.  Reviewed patient history and cardiac cath films from last night.   71 yo female with morbid obesity and HTN presented with acute inferior STEMI  Cath films reviewed independently. She has 80% distal left main stenosis. It is calcified. There is also diffuse 50% narrowing in the mid  LCx. The RCA has extensive thrombosis from the mid to distal vessel and into the PDA. Could not improve with thrombectomy.   Her anatomy is not favorable for PCI. I don't think there are any PCI options for the RCA. She could technically have PCI of the left main - this would require atherectomy and bifurcation stenting. In the setting of acute inferior infarct this would be extremely high risk and she likely would go ahead and occlude the RCA. Risk of major complications would be very high. Her anatomy is certainly more favorable for CABG but I understand that operative risk is high due to obesity and short stature.   Wilfrido Luedke Martinique MD, [facility]

## 2021-10-27 NOTE — Anesthesia Procedure Notes (Signed)
Central Venous Catheter Insertion Performed by: Audry Pili, MD, anesthesiologist Start/End2/03/2022 8:09 AM, 10/27/2021 8:15 AM Patient location: OR. Preanesthetic checklist: patient identified, IV checked, risks and benefits discussed, surgical consent, monitors and equipment checked, pre-op evaluation, timeout performed and anesthesia consent Position: supine Patient sedated Hand hygiene performed , maximum sterile barriers used  and Seldinger technique used Catheter size: 8.5 Fr Central line was placed.MAC introducer Procedure performed using ultrasound guided technique. Ultrasound Notes:anatomy identified, needle tip was noted to be adjacent to the nerve/plexus identified, no ultrasound evidence of intravascular and/or intraneural injection and image(s) printed for medical record Attempts: 1 Following insertion, line sutured, dressing applied and Biopatch. Post procedure assessment: blood return through all ports, no air and free fluid flow  Patient tolerated the procedure well with no immediate complications.

## 2021-10-27 NOTE — Progress Notes (Signed)
° °   °  [address]       [place], [address]             [phone]    S/p CABG x 4 Intubated, sedated  BP 103/82    Pulse 88    Temp (!) 95.7 F (35.4 C)    Resp 20    Ht 4\' 11"  (1.499 m)    Wt 110 kg    SpO2 100%    BMI 48.98 kg/m  39/22 CI= 2.3  On milrinone 0.25, epi 18, norepi 25, IABP 1:1   Intake/Output Summary (Last 24 hours) at 10/27/2021 1805 Last data filed at 10/27/2021 1745 Gross per 24 hour  Intake 8053.21 ml  Output 2327 ml  Net 5726.21 ml   IMV 20/80%/ 5 PEEP 7.2/57/123/24  K= 3.7 Hct 19- transfusion underway  Critically ill early postop  Deborah Kluttz [name], MD [facility] [phone]

## 2021-10-27 NOTE — Progress Notes (Deleted)
CT Surgery Discussed patients condition with family, sons Drenda Freeze and [place]. She has persistent metabolic acidosis with organ failure from probable septic shock now with decline in her cardiac index and coox. They understand she is not expected to improve at this point and agree to a DNR status. They wish to wait until tomorrow to discuss withdrawing support. Goal to keep patient comfortable with pain meds d/w  family.

## 2021-10-27 NOTE — Progress Notes (Signed)
°  Echocardiogram Echocardiogram Transesophageal has been performed.  Deborah Cooke 10/27/2021, 8:50 AM

## 2021-10-27 NOTE — Anesthesia Preprocedure Evaluation (Addendum)
Anesthesia Evaluation  Patient identified by MRN, date of birth, ID band Patient awake    Reviewed: Allergy & Precautions, NPO status , Patient's Chart, lab work & pertinent test results  History of Anesthesia Complications (+) PROLONGED EMERGENCE and history of anesthetic complications  Airway Mallampati: III  TM Distance: >3 FB Neck ROM: Limited    Dental  (+) Dental Advisory Given   Pulmonary former smoker,    breath sounds clear to auscultation       Cardiovascular hypertension, Pt. on medications + CAD and + Past MI  + dysrhythmias  Rhythm:Regular Rate:Normal   '23 Cath - Prox RCA lesion is 75% stenosed.   Mid RCA to Dist RCA lesion is 75% stenosed with 75% stenosed side branch in RPDA.  Heavy thrombus.   Mid LM to Ost LAD lesion is 80% stenosed.   The left ventricular systolic function is normal.   LV end diastolic pressure is moderately elevated.   The left ventricular ejection fraction is 55-65% by visual estimate.   There is no aortic valve stenosis.     Neuro/Psych  Headaches, PSYCHIATRIC DISORDERS Anxiety Depression    GI/Hepatic Neg liver ROS, GERD  Medicated,  Endo/Other  Hypothyroidism Morbid obesity  Renal/GU negative Renal ROS     Musculoskeletal  (+) Arthritis ,   Abdominal   Peds  Hematology  (+) Blood dyscrasia, anemia ,   Anesthesia Other Findings Chronic pain   Reproductive/Obstetrics                            Anesthesia Physical Anesthesia Plan  ASA: 4 and emergent  Anesthesia Plan: General   Post-op Pain Management:    Induction: Intravenous  PONV Risk Score and Plan: 3 and Treatment may vary due to age or medical condition  Airway Management Planned: Oral ETT  Additional Equipment: Arterial line, CVP, PA Cath, TEE and Ultrasound Guidance Line Placement  Intra-op Plan:   Post-operative Plan: Extubation in OR and Post-operative  intubation/ventilation  Informed Consent: I have reviewed the patients History and Physical, chart, labs and discussed the procedure including the risks, benefits and alternatives for the proposed anesthesia with the patient or authorized representative who has indicated his/her understanding and acceptance.     Dental advisory given  Plan Discussed with: CRNA and Anesthesiologist  Anesthesia Plan Comments:        Anesthesia Quick Evaluation

## 2021-10-28 ENCOUNTER — Other Ambulatory Visit: Payer: Self-pay

## 2021-10-28 ENCOUNTER — Encounter (HOSPITAL_COMMUNITY): Payer: Self-pay | Admitting: Cardiothoracic Surgery

## 2021-10-28 ENCOUNTER — Inpatient Hospital Stay (HOSPITAL_COMMUNITY): Payer: Medicare Other

## 2021-10-28 LAB — BASIC METABOLIC PANEL
Anion gap: 7 (ref 5–15)
Anion gap: 8 (ref 5–15)
BUN: 38 mg/dL — ABNORMAL HIGH (ref 8–23)
BUN: 36 mg/dL — ABNORMAL HIGH (ref 8–23)
CO2: 25 mmol/L (ref 22–32)
CO2: 23 mmol/L (ref 22–32)
Calcium: 7.9 mg/dL — ABNORMAL LOW (ref 8.9–10.3)
Calcium: 7.7 mg/dL — ABNORMAL LOW (ref 8.9–10.3)
Chloride: 108 mmol/L (ref 98–111)
Chloride: 108 mmol/L (ref 98–111)
Creatinine, Ser: 0.98 mg/dL (ref 0.44–1.00)
Creatinine, Ser: 0.97 mg/dL (ref 0.44–1.00)
GFR, Estimated: >60 mL/min (ref >60)
GFR, Estimated: >60 mL/min (ref >60)
Glucose, Bld: 86 mg/dL (ref 70–99)
Glucose, Bld: 111 mg/dL — ABNORMAL HIGH (ref 70–99)
Potassium: 4.1 mmol/L (ref 3.5–5.1)
Potassium: 4.3 mmol/L (ref 3.5–5.1)
Sodium: 140 mmol/L (ref 135–145)
Sodium: 139 mmol/L (ref 135–145)

## 2021-10-28 LAB — PREPARE RBC (CROSSMATCH)

## 2021-10-28 LAB — GLUCOSE, CAPILLARY
Glucose-Capillary: 103 mg/dL — ABNORMAL HIGH (ref 70–99)
Glucose-Capillary: 107 mg/dL — ABNORMAL HIGH (ref 70–99)
Glucose-Capillary: 111 mg/dL — ABNORMAL HIGH (ref 70–99)
Glucose-Capillary: 114 mg/dL — ABNORMAL HIGH (ref 70–99)
Glucose-Capillary: 114 mg/dL — ABNORMAL HIGH (ref 70–99)
Glucose-Capillary: 114 mg/dL — ABNORMAL HIGH (ref 70–99)
Glucose-Capillary: 118 mg/dL — ABNORMAL HIGH (ref 70–99)
Glucose-Capillary: 125 mg/dL — ABNORMAL HIGH (ref 70–99)
Glucose-Capillary: 130 mg/dL — ABNORMAL HIGH (ref 70–99)
Glucose-Capillary: 140 mg/dL — ABNORMAL HIGH (ref 70–99)
Glucose-Capillary: 142 mg/dL — ABNORMAL HIGH (ref 70–99)
Glucose-Capillary: 146 mg/dL — ABNORMAL HIGH (ref 70–99)
Glucose-Capillary: 148 mg/dL — ABNORMAL HIGH (ref 70–99)
Glucose-Capillary: 186 mg/dL — ABNORMAL HIGH (ref 70–99)
Glucose-Capillary: 88 mg/dL (ref 70–99)
Glucose-Capillary: 90 mg/dL (ref 70–99)
Glucose-Capillary: 92 mg/dL (ref 70–99)

## 2021-10-28 LAB — BPAM PLATELET PHERESIS
Blood Product Expiration Date: 202302082359
ISSUE DATE / TIME: 202302071149
Unit Type and Rh: 6200

## 2021-10-28 LAB — COOXEMETRY PANEL
Carboxyhemoglobin: 0.8 % (ref 0.5–1.5)
Carboxyhemoglobin: 0.3 % — ABNORMAL LOW (ref 0.5–1.5)
Methemoglobin: 1.2 % (ref 0.0–1.5)
Methemoglobin: 0.6 % (ref 0.0–1.5)
O2 Saturation: 49.2 %
O2 Saturation: 51 %
Total hemoglobin: 7.2 g/dL — ABNORMAL LOW (ref 12.0–16.0)
Total hemoglobin: 8.3 g/dL — ABNORMAL LOW (ref 12.0–16.0)

## 2021-10-28 LAB — POCT I-STAT 7, (LYTES, BLD GAS, ICA,H+H)
Acid-Base Excess: 0 mmol/L (ref 0.0–2.0)
Acid-Base Excess: 0 mmol/L (ref 0.0–2.0)
Acid-base deficit: 1 mmol/L (ref 0.0–2.0)
Bicarbonate: 24.1 mmol/L (ref 20.0–28.0)
Bicarbonate: 24.4 mmol/L (ref 20.0–28.0)
Bicarbonate: 24.2 mmol/L (ref 20.0–28.0)
Calcium, Ion: 1.16 mmol/L (ref 1.15–1.40)
Calcium, Ion: 1.15 mmol/L (ref 1.15–1.40)
Calcium, Ion: 1.13 mmol/L — ABNORMAL LOW (ref 1.15–1.40)
HCT: 23 % — ABNORMAL LOW (ref 36.0–46.0)
HCT: 25 % — ABNORMAL LOW (ref 36.0–46.0)
HCT: 26 % — ABNORMAL LOW (ref 36.0–46.0)
Hemoglobin: 7.8 g/dL — ABNORMAL LOW (ref 12.0–15.0)
Hemoglobin: 8.5 g/dL — ABNORMAL LOW (ref 12.0–15.0)
Hemoglobin: 8.8 g/dL — ABNORMAL LOW (ref 12.0–15.0)
O2 Saturation: 99 %
O2 Saturation: 96 %
O2 Saturation: 97 %
Patient temperature: 37.2
Patient temperature: 37.9
Patient temperature: 37.7
Potassium: 3.7 mmol/L (ref 3.5–5.1)
Potassium: 4 mmol/L (ref 3.5–5.1)
Potassium: 4.2 mmol/L (ref 3.5–5.1)
Sodium: 141 mmol/L (ref 135–145)
Sodium: 140 mmol/L (ref 135–145)
Sodium: 139 mmol/L (ref 135–145)
TCO2: 25 mmol/L (ref 22–32)
TCO2: 26 mmol/L (ref 22–32)
TCO2: 25 mmol/L (ref 22–32)
pCO2 arterial: 44.5 mmHg (ref 32.0–48.0)
pCO2 arterial: 41 mmHg (ref 32.0–48.0)
pCO2 arterial: 38.9 mmHg (ref 32.0–48.0)
pH, Arterial: 7.343 — ABNORMAL LOW (ref 7.350–7.450)
pH, Arterial: 7.387 (ref 7.350–7.450)
pH, Arterial: 7.405 (ref 7.350–7.450)
pO2, Arterial: 159 mmHg — ABNORMAL HIGH (ref 83.0–108.0)
pO2, Arterial: 84 mmHg (ref 83.0–108.0)
pO2, Arterial: 92 mmHg (ref 83.0–108.0)

## 2021-10-28 LAB — HEPATIC FUNCTION PANEL
ALT: 53 U/L — ABNORMAL HIGH (ref 0–44)
AST: 115 U/L — ABNORMAL HIGH (ref 15–41)
Albumin: 3 g/dL — ABNORMAL LOW (ref 3.5–5.0)
Alkaline Phosphatase: 91 U/L (ref 38–126)
Bilirubin, Direct: 0.1 mg/dL (ref 0.0–0.2)
Indirect Bilirubin: 0.2 mg/dL — ABNORMAL LOW (ref 0.3–0.9)
Total Bilirubin: 0.3 mg/dL (ref 0.3–1.2)
Total Protein: 4.7 g/dL — ABNORMAL LOW (ref 6.5–8.1)

## 2021-10-28 LAB — PREPARE FRESH FROZEN PLASMA
Unit division: 0
Unit division: 0
Unit division: 0
Unit division: 0

## 2021-10-28 LAB — CBC
HCT: 22 % — ABNORMAL LOW (ref 36.0–46.0)
HCT: 24 % — ABNORMAL LOW (ref 36.0–46.0)
Hemoglobin: 7.7 g/dL — ABNORMAL LOW (ref 12.0–15.0)
Hemoglobin: 8.2 g/dL — ABNORMAL LOW (ref 12.0–15.0)
MCH: 31.2 pg (ref 26.0–34.0)
MCH: 30.4 pg (ref 26.0–34.0)
MCHC: 35 g/dL (ref 30.0–36.0)
MCHC: 34.2 g/dL (ref 30.0–36.0)
MCV: 89.1 fL (ref 80.0–100.0)
MCV: 88.9 fL (ref 80.0–100.0)
Platelets: 150 10*3/uL (ref 150–400)
Platelets: 145 10*3/uL — ABNORMAL LOW (ref 150–400)
RBC: 2.47 MIL/uL — ABNORMAL LOW (ref 3.87–5.11)
RBC: 2.7 MIL/uL — ABNORMAL LOW (ref 3.87–5.11)
RDW: 15.2 % (ref 11.5–15.5)
RDW: 15.9 % — ABNORMAL HIGH (ref 11.5–15.5)
WBC: 10.1 10*3/uL (ref 4.0–10.5)
WBC: 13.1 10*3/uL — ABNORMAL HIGH (ref 4.0–10.5)
nRBC: 4.1 % — ABNORMAL HIGH (ref 0.0–0.2)
nRBC: 8.1 % — ABNORMAL HIGH (ref 0.0–0.2)

## 2021-10-28 LAB — BPAM FFP
Blood Product Expiration Date: 202302072359
Blood Product Expiration Date: 202302072359
Blood Product Expiration Date: 202302072359
Blood Product Expiration Date: 202302072359
ISSUE DATE / TIME: 202302071149
ISSUE DATE / TIME: 202302071149
ISSUE DATE / TIME: 202302071420
ISSUE DATE / TIME: 202302071420
Unit Type and Rh: 6200
Unit Type and Rh: 6200
Unit Type and Rh: 6200
Unit Type and Rh: 6200

## 2021-10-28 LAB — PREPARE PLATELET PHERESIS: Unit division: 0

## 2021-10-28 LAB — SURGICAL PATHOLOGY

## 2021-10-28 LAB — MAGNESIUM
Magnesium: 2.8 mg/dL — ABNORMAL HIGH (ref 1.7–2.4)
Magnesium: 2.9 mg/dL — ABNORMAL HIGH (ref 1.7–2.4)

## 2021-10-28 MED ORDER — PROPOFOL 1000 MG/100ML IV EMUL
0.0000 ug/kg/min | INTRAVENOUS | Status: DC
  Administered 2021-10-28: 30 ug/kg/min via INTRAVENOUS
  Administered 2021-10-28: 35 ug/kg/min via INTRAVENOUS
  Administered 2021-10-28: 5 ug/kg/min via INTRAVENOUS
  Administered 2021-10-28 – 2021-10-29 (×4): 35 ug/kg/min via INTRAVENOUS
  Administered 2021-10-29: 45 ug/kg/min via INTRAVENOUS
  Administered 2021-10-29: 40 ug/kg/min via INTRAVENOUS
  Administered 2021-10-29 (×2): 35 ug/kg/min via INTRAVENOUS
  Administered 2021-10-30 (×2): 40 ug/kg/min via INTRAVENOUS
  Administered 2021-10-30: 28 ug/kg/min via INTRAVENOUS
  Filled 2021-10-28 (×3): qty 100
  Filled 2021-10-28: qty 200
  Filled 2021-10-28 (×6): qty 100

## 2021-10-28 MED ORDER — FUROSEMIDE 10 MG/ML IJ SOLN
40.0000 mg | Freq: Two times a day (BID) | INTRAMUSCULAR | Status: DC
  Administered 2021-10-28 – 2021-11-03 (×13): 40 mg via INTRAVENOUS
  Filled 2021-10-28 (×13): qty 4

## 2021-10-28 MED ORDER — SODIUM CHLORIDE 0.9% IV SOLUTION: Freq: Once | INTRAVENOUS | Status: AC

## 2021-10-28 MED ORDER — INSULIN ASPART 100 UNIT/ML IJ SOLN
0.0000 [IU] | INTRAMUSCULAR | Status: DC
  Administered 2021-10-28 – 2021-10-29 (×5): 2 [IU] via SUBCUTANEOUS
  Administered 2021-10-29: 4 [IU] via SUBCUTANEOUS
  Administered 2021-10-29 – 2021-11-02 (×9): 2 [IU] via SUBCUTANEOUS

## 2021-10-28 MED ORDER — INSULIN DETEMIR 100 UNIT/ML ~~LOC~~ SOLN
12.0000 [IU] | Freq: Two times a day (BID) | SUBCUTANEOUS | Status: DC
  Administered 2021-10-29 – 2021-10-30 (×3): 12 [IU] via SUBCUTANEOUS
  Filled 2021-10-28 (×6): qty 0.12

## 2021-10-28 MED ORDER — DOCUSATE SODIUM 50 MG/5ML PO LIQD
200.0000 mg | Freq: Every day | ORAL | Status: DC
  Administered 2021-10-28 – 2021-11-02 (×6): 200 mg
  Filled 2021-10-28 (×6): qty 20

## 2021-10-28 MED FILL — Magnesium Sulfate Inj 50%: INTRAMUSCULAR | Qty: 10 | Status: AC

## 2021-10-28 MED FILL — Heparin Sodium (Porcine) Inj 1000 Unit/ML: Qty: 1000 | Status: AC

## 2021-10-28 MED FILL — Potassium Chloride Inj 2 mEq/ML: INTRAVENOUS | Qty: 40 | Status: AC

## 2021-10-28 NOTE — Anesthesia Postprocedure Evaluation (Signed)
Anesthesia Post Note  Patient: Deborah Cooke  Procedure(s) Performed: CORONARY ARTERY BYPASS GRAFTING (CABG) TIMES FOUR, USING LEFT INTERNAL MAMMARY ARTERY AND RIGHT LEG GREATER SAPHENOUS VEIN HARVESTED ENDOSCOPICALLY AND RIGHT CORONARY THROMBECTOMY (Chest) TRANSESOPHAGEAL ECHOCARDIOGRAM (TEE) APPLICATION OF CELL SAVER ENDOVEIN HARVEST OF GREATER SAPHENOUS VEIN (Right: Leg Lower)     Patient location during evaluation: ICU Anesthesia Type: General Level of consciousness: sedated and patient remains intubated per anesthesia plan Pain management: pain level controlled Vital Signs Assessment: post-procedure vital signs reviewed and stable Respiratory status: patient remains intubated per anesthesia plan Cardiovascular status: On IABP at 1:1, with milrinone, levophed, and epinephrine infusions running to maintain MAP. Postop Assessment: no apparent nausea or vomiting Anesthetic complications: no   No notable events documented.  Last Vitals:  Vitals:   10/28/21 1100 10/28/21 1115  BP: (!) 93/56 (!) 105/58  Pulse:    Resp: 18 15  Temp: 37.8 C 37.8 C  SpO2: 94% 95%    Last Pain:  Vitals:   10/28/21 0800  TempSrc: Core  PainSc:                  Audry Pili

## 2021-10-28 NOTE — Progress Notes (Signed)
0700 - Report received from Gwyndolyn Saxon, [place].  All questions answered. All lines, drains, and drips verified.  Safety checks performed. Hand hygiene performed before/after each pt contact. [place], RN with turn to change sheets.  Pt became anxious.  Pt given Midazolam and calmed. Myron, PA rounded. 0800 - Pt's family arrived to visit.   No SCD on R leg d/t JP drain. 8325 - Dr. Darcey Nora rounded.  Pt's family updated by Dr. Darcey Nora.  Pt's family asked to go to the waiting room so that Dr. Darcey Nora could adjust IABP.  Dr. Darcey Nora adjusted IABP. No extubation today. Propofol ordered.  0900 - I went to the waiting room to let family know they could come back, pt's family not present in waiting room. 0930 - RT adjusted RR to 16, per Dr. Thayer Ohm instruction.  1030 - ABG drawn. 1100 - Morning rounds. 1200 - TPM rate increased to 98.  Better compliance achieved with IABP.  Pt with frequent ectopy prior to HR increase. 26 - Pt's family arrived to visit. 71 - Pt's family left. 83 - Pt's son at bedside.  Hassan Rowan, Case Manager, notified.  Family requesting that Ronalee Belts (husband) be called tomorrow upon extubation, if he isn't present at bedside. Pt's son, Remo Lipps, had several questions regarding extubation and expectations for pt while she is in [facility].  All questions answered.  We also discussed that we will contact one person with updates as our primary concern is high quality patient care.  Remo Lipps advised that he understood.   [place] left. 1640 - IABP at 1:2, per order 1710 - Squeaking noise noise noted from patient and in-line suction protector inflated.  [place], RT notified and she came to bedside with a new in-line suction catheter. Squeaking noise ceased. 1740 - Discussed Levemir dose with Hildred Alamin, [facility].  Due to pt not having nutrition or steroids and recent CBGs being WNL, Levemir held. 1800 - Dr. Cyndia Bent rounded.  1900 - Report given to Gwyndolyn Saxon, Therapist, sports.  All questions answered.

## 2021-10-28 NOTE — Progress Notes (Signed)
Patient ID: Deborah Cooke, female   DOB: [birth year], 71 y.o.   MRN: [id number] [facility] Evening Rounds:  Hemodynamically stable on epi 16, NE 10, IABP 1:2  AV paced 98, amio 30.  CI 2.6  UO ok  BMET    Component Value Date/Time   NA 139 10/28/2021 1619   NA 141 08/19/2017 1221   K 4.3 10/28/2021 1619   K 4.5 08/19/2017 1221   CL 108 10/28/2021 1619   CO2 23 10/28/2021 1619   CO2 28 08/19/2017 1221   GLUCOSE 111 (H) 10/28/2021 1619   GLUCOSE 84 08/19/2017 1221   BUN 36 (H) 10/28/2021 1619   BUN 18.3 08/19/2017 1221   CREATININE 0.97 10/28/2021 1619   CREATININE 0.9 08/19/2017 1221   CALCIUM 7.7 (L) 10/28/2021 1619   CALCIUM 9.2 08/19/2017 1221   EGFR >60 08/19/2017 1221   GFRNONAA >60 10/28/2021 1619    CBC    Component Value Date/Time   WBC 13.1 (H) 10/28/2021 1619   RBC 2.70 (L) 10/28/2021 1619   HGB 8.2 (L) 10/28/2021 1619   HGB 12.4 08/19/2017 1221   HCT 24.0 (L) 10/28/2021 1619   HCT 37.8 08/19/2017 1221   PLT 145 (L) 10/28/2021 1619   PLT 233 08/19/2017 1221   MCV 88.9 10/28/2021 1619   MCV 89.8 08/19/2017 1221   MCH 30.4 10/28/2021 1619   MCHC 34.2 10/28/2021 1619   RDW 15.9 (H) 10/28/2021 1619   RDW 14.5 08/19/2017 1221   LYMPHSABS 1.4 10/26/2021 2240   LYMPHSABS 1.2 08/19/2017 1221   MONOABS 0.7 10/26/2021 2240   MONOABS 0.3 08/19/2017 1221   EOSABS 0.0 10/26/2021 2240   EOSABS 0.1 08/19/2017 1221   BASOSABS 0.0 10/26/2021 2240   BASOSABS 0.0 08/19/2017 1221    CT output low.  Remains sedated on vent but follows commands.  Possible extubation tomorrow.

## 2021-10-28 NOTE — Progress Notes (Addendum)
[facility] DAILY ICU PROGRESS NOTE ° °                 [address] °           [place] [address] °         [phone]  ° °1 Day Post-Op °Procedure(s) (LRB): °CORONARY ARTERY BYPASS GRAFTING (CABG) TIMES FOUR, USING LEFT INTERNAL MAMMARY ARTERY AND RIGHT LEG GREATER SAPHENOUS VEIN HARVESTED ENDOSCOPICALLY AND RIGHT CORONARY THROMBECTOMY (N/A) °TRANSESOPHAGEAL ECHOCARDIOGRAM (TEE) (N/A) °APPLICATION OF CELL SAVER °ENDOVEIN HARVEST OF GREATER SAPHENOUS VEIN (Right) ° °Total Length of Stay:  LOS: 2 days  ° °Subjective: °Sedated and mechanically ventilated.  Will awaken, move all extremities, and respond appropriately when sedation off.  ° °Milrinone 0.3mcg/kg/min °Epi  18mcg/min °Levo  16mcg/min °IABP   1:1 °CI  2.2-2.6 °CoOx  49 ° °Objective: °Vital signs in last 24 hours: °Temp:  [95.4 °F (35.2 °C)-99.9 °F (37.7 °C)] 99.9 °F (37.7 °C) (02/08 0645) °Pulse Rate:  [81-219] 90 (02/08 0311) °Cardiac Rhythm: A-V Sequential paced (02/07 1600) °Resp:  [15-23] 19 (02/08 0645) °BP: (74-118)/(41-82) 103/50 (02/08 0645) °SpO2:  [94 %-100 %] 97 % (02/08 0645) °FiO2 (%):  [40 %-80 %] 40 % (02/08 0312) °Weight:  [123.4 kg] 123.4 kg (02/08 0645) ° °Filed Weights  ° 10/26/21 2100 10/26/21 2349 10/28/21 0645  °Weight: 109 kg 110 kg 123.4 kg  ° ° °Weight change: 14.4 kg  ° °Hemodynamic parameters for last 24 hours: °PAP: (28-41)/(14-23) 34/18 °CVP:  [13 mmHg-33 mmHg] 33 mmHg °CO:  [3 L/min-6.3 L/min] 5.3 L/min °CI:  [1.5 L/min/m2-3.2 L/min/m2] 2.7 L/min/m2 ° °Intake/Output from previous day: °02/07 0701 - 02/08 0700 °In: 10165.4 [I.V.:6095.7; Blood:2185; IV Piggyback:1884.7] °Out: 3031 [Urine:1640; Drains:104; Blood:947; Chest Tube:340] ° °Intake/Output this shift: °No intake/output data recorded. ° °Current Meds: °Scheduled Meds: ° sodium chloride   Intravenous Once  ° acetaminophen  1,000 mg Oral Q6H  ° Or  ° acetaminophen (TYLENOL) oral liquid 160 mg/5 mL  1,000 mg Per Tube Q6H  ° aspirin EC  325 mg Oral Daily  ° Or  °  aspirin  324 mg Per Tube Daily  ° bisacodyl  10 mg Oral Daily  ° Or  ° bisacodyl  10 mg Rectal Daily  ° chlorhexidine gluconate (MEDLINE KIT)  15 mL Mouth Rinse BID  ° Chlorhexidine Gluconate Cloth  6 each Topical Daily  ° docusate sodium  200 mg Oral Daily  ° mouth rinse  15 mL Mouth Rinse 10 times per day  ° metoCLOPramide (REGLAN) injection  10 mg Intravenous Once  ° metoCLOPramide (REGLAN) injection  10 mg Intravenous Q6H  ° [START ON 10/29/2021] pantoprazole  40 mg Oral Daily  ° sodium chloride flush  3 mL Intravenous Q12H  ° °Continuous Infusions: ° sodium chloride 20 mL/hr at 10/28/21 0600  ° sodium chloride 250 mL (10/28/21 0617)  ° sodium chloride 20 mL/hr at 10/27/21 1621  ° sodium chloride 10 mL/hr at 10/28/21 0600  ° albumin human 12.5 g (10/27/21 1617)  ° amiodarone 30 mg/hr (10/28/21 0600)  °  ceFAZolin (ANCEF) IV 2 g (10/28/21 0617)  ° dexmedetomidine (PRECEDEX) IV infusion 0.7 mcg/kg/hr (10/28/21 0620)  ° epinephrine 18 mcg/min (10/28/21 0600)  ° insulin Stopped (10/28/21 0442)  ° lactated ringers    ° milrinone 0.3 mcg/kg/min (10/28/21 0630)  ° nitroGLYCERIN    ° norepinephrine (LEVOPHED) Adult infusion 16 mcg/min (10/28/21 0600)  ° °PRN Meds:.sodium chloride, albumin human, dextrose, metoprolol tartrate, midazolam, morphine   injection, ondansetron (ZOFRAN) IV, oxyCODONE, sodium chloride flush, traMADol ° °General appearance: Sedated, on vent °Neurologic: Unable to assess now as she just had versed. Reported by RN to MAE, respond appropriately.  °Heart: A-V paced, EKG this am shows AJR,  °Lungs: breath sounds are clear with good air movement bilat.  °Abdomen: firm, no apparent tenderness. Absent bowel sounds °Extremities: Warm with good distal perfusion throughout. IABP in right femoral artery, right femoral venous sheath remains in place.  °Wound: the sternotomy incision is covered with a dry Aquacel dressing and breast binder. The rith EVH incisions are covered with dry dressings. The RLE JP drain  had ~100ml sero-sanguinous fluid since surgery.  ° °Lab Results: °CBC: °Recent Labs  °  10/27/21 °2234 10/28/21 °0036 10/28/21 °0439  °WBC 8.6  --  10.1  °HGB 8.0* 7.8* 7.7*  °HCT 23.2* 23.0* 22.0*  °PLT 156  --  150  ° °BMET:  °Recent Labs  °  10/27/21 °2234 10/28/21 °0036 10/28/21 °0439  °NA 141 141 140  °K 3.5 3.7 4.1  °CL 106  --  108  °CO2 24  --  25  °GLUCOSE 170*  --  86  °BUN 40*  --  38*  °CREATININE 1.21*  --  0.98  °CALCIUM 7.6*  --  7.9*  °  °CMET: °Lab Results  °Component Value Date  ° WBC 10.1 10/28/2021  ° HGB 7.7 (L) 10/28/2021  ° HCT 22.0 (L) 10/28/2021  ° PLT 150 10/28/2021  ° GLUCOSE 86 10/28/2021  ° CHOL 154 10/26/2021  ° TRIG 145 10/26/2021  ° HDL 55 10/26/2021  ° LDLCALC 70 10/26/2021  ° ALT 53 (H) 10/28/2021  ° AST 115 (H) 10/28/2021  ° NA 140 10/28/2021  ° K 4.1 10/28/2021  ° CL 108 10/28/2021  ° CREATININE 0.98 10/28/2021  ° BUN 38 (H) 10/28/2021  ° CO2 25 10/28/2021  ° TSH 2.819 10/15/2020  ° INR 1.7 (H) 10/27/2021  ° HGBA1C 5.3 10/26/2021  ° ° ° ° °PT/INR:  °Recent Labs  °  10/27/21 °1652  °LABPROT 20.0*  °INR 1.7*  ° °Radiology: DG Chest Portable 1 View ° °Result Date: 10/27/2021 °CLINICAL DATA:  Status post coronary bypass surgery EXAM: PORTABLE CHEST 1 VIEW COMPARISON:  Previous studies including the examination done earlier today FINDINGS: Transverse diameter of heart is increased. There are linear densities in the left mid and both lower lung fields suggesting subsegmental atelectasis. Tip of endotracheal tube is approximately 3.3 cm above the carina. Tip of Swan-Ganz catheter is seen in the main pulmonary artery. Right chest tube is noted. There is mediastinal drain adjacent to the inferior margin of the heart. There is blunting of left lateral CP angle. There is no pneumothorax. IMPRESSION: Cardiomegaly. Linear densities seen in the left mid and both lower lung fields suggesting subsegmental atelectasis. Other findings as described in the body of the report. Electronically Signed    By: Palani  Rathinasamy M.D.   On: 10/27/2021 15:41   ° ° °Assessment/Plan: °S/P Procedure(s) (LRB): °CORONARY ARTERY BYPASS GRAFTING (CABG) TIMES FOUR, USING LEFT INTERNAL MAMMARY ARTERY AND RIGHT LEG GREATER SAPHENOUS VEIN HARVESTED ENDOSCOPICALLY AND RIGHT CORONARY THROMBECTOMY (N/A) °TRANSESOPHAGEAL ECHOCARDIOGRAM (TEE) (N/A) °APPLICATION OF CELL SAVER °ENDOVEIN HARVEST OF GREATER SAPHENOUS VEIN (Right) ° °-POD1 Emergency CABG x 4 after presenting with acute STEMI. Currently supported with IABP and  Epi, milrinone, Levo.  CoOx 49.  Plan to repeat the CoOx this afternoon and if improved, will decrease the IABP to 1:2. Continue inotropic support   and wean the NE  as tolerated.    ° °-Peri-op VF abd poat-op A-fib- currently AV paced with intrinsic accelerated JR. On amiodarone IV. ° °-PULM- oxygenation adequate on FiO2 0.4.  Has significant volume excess with Wt 14kg+ since surgery and CVP 18. No plans to wean vent support today.  ° °-HEME- expected acute blood loss anemia. Post-op coagulopathy corrected and CT drainage and RLE drainage decreasing. Transfusing 2 units PRBC's this morning. Monitor.  ° °-RENAL- Normal renal function noted on admission to [facility] last week but Creat 2.1 on admission to [facility] on 2/6.  Now 0.98, UO adequate. Holding off on diuresis for now. ° °-NEURO-sedated now but reported to MAE and respond appropriately earlier. ° ° ° °Myron G. Roddenberry, PA-C °[phone] °10/28/2021 8:03 AM ° °Neuro intact- will wean vent respiratory rate in preparation mor planned extubation tomorrow °Decrease IABP to 1:2 at 8 pm, cont inotropes for RV dysfunction °Start lasix for diuresis to remove fluid prior to extubation ° °patient examined and medical record reviewed,agree with above note. °  °10/28/2021 ° ° °

## 2021-10-28 NOTE — Op Note (Signed)
Deborah Cooke, VASCO MEDICAL RECORD NO: [id number] ACCOUNT NO: [id number] DATE OF BIRTH: 07-Jan-1951 FACILITY: [facility] LOCATION: [facility] PHYSICIAN: Ivin Poot III, MD  Operative Report   DATE OF PROCEDURE: 10/27/2021  OPERATION PERFORMED:   1.  Coronary artery bypass grafting x4 (native saphenous vein graft to LAD, cryopreserved allograft saphenous vein graft to the posterior descending, sequential cryopreserved allograft vein graft to the OM1 and OM2). 2.  Endoscopic harvest of right leg greater saphenous vein segment.  The remainder of the native saphenous vein was scarred and not adequate as conduit. 3. 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  SURGEON:  Len Childs, MD  ASSISTANT:  Enid Cutter, PA-C.  A surgical first assistant was needed for this operation as the standard of care for complex cardiac surgery as well as to assist with the endoscopic harvest of the greater saphenous vein and to assist with the  anastomosis of the bypass grafts as well as providing suction and exposure, general assistance and dissection.  ANESTHESIA:  General.  CLINICAL NOTE:  The patient is a 71 year old morbidly obese female who presented with a STEMI to the Cardiology Service and underwent an urgent cardiac catheterization.  She was found to have a left main stenosis as well as moderate stenosis of the RCA  with a thrombus.  LV function appeared to be fairly well preserved by ventriculogram.  Because of her high risk anatomy, a balloon pump was placed in the cath lab by her cardiologist.  I came to the cath lab to examine the patient and to discuss the  coordination of care  with the patient's cardiologist, Dr. Irish Lack, after reviewing the images of the coronary arteriograms in the cath lab.  The patient also had a temporary pacing wire placed for some symptomatic bradycardia.  When I examined the patient in the cath lab, she was not having chest pain, awake and comfortable with stable hemodynamics.  I agreed with the plan for coronary artery bypass grafting as the best option for this patient; however, felt that it was at high  risk due to her super obese body habitus and the expected difficulty with conduit.  Subsequently, after the patient was reviewed by other cardiologist, it was recommended that she would not be a feasible candidate for PCI due to her coronary anatomy and  high risk surgery was felt to be the best approach.  She was maintained on bivalirudin and cangrelor anticoagulation overnight in preparation for multivessel coronary bypass grafting the next morning.  Both the patient and her family understood this was  a high risk operation, but would be the best long-term treatment for her severe multivessel coronary artery disease.  She understood the risks of prolonged ventilator dependence, bleeding, blood transfusion, stroke, arrhythmias, infection, organ  failure, death.  She agreed to proceed with surgery under what I felt was an informed consent.  OPERATIVE FINDINGS:   1.  Extensive dissection in the right leg yielded only one segment of usable saphenous vein.  The remainder of the greater saphenous vein was scarred and too small to use.  The left leg had multiple surgical scars from orthopedic procedures and was not  felt to be of any benefit to make incisions  in that leg in search of the vein.  We proceeded with using cryopreserved vein in order to provide adequate revascularization. 2.  Sudden episode of ventricular fibrillation from stable hemodynamics while the chest was being opened under anesthesia, which required internal cardiac massage and urgent  cannulation placed on cardiopulmonary bypass with restoration of normal  hemodynamics.  For that reason and because of the patient's body habitus, the internal mammary artery could not be exposed or safely harvested. 3.  Good revascularization of the vein grafts, but persistent coagulopathy after reversal of heparin with protamine due to the preoperative anticoagulation with bivalirudin and cangrelor requiring blood product administration. 4.  During the skin closure, a sudden episode of transient hypotension related to infusion of FFP on a temporary basis, which was treated with stopping FFP and giving the patient a dose of steroid and epinephrine with rapid response and normalization of  blood pressure.  During this period of time, LV function was imaged with echo and was excellent.  DESCRIPTION OF PROCEDURE:  The patient was brought directly from the ICU to the operating room on balloon pump support and pacing.  The patient was carefully placed on the operating room table because of her recent total shoulder replacement on the right  side.  General anesthesia was induced and the patient remained stable.  She was paced to keep her blood pressure stable with the temporary transvenous pacer placed in the cath lab as well as backup external transcutaneous pacing as needed.  The patient  was prepped and draped as a sterile field.  A proper timeout was performed.  A sternotomy was performed as the vein was harvested with endoscopic technique from the right leg. After opening the chest and placing the retractor, the patient had a sudden  VFib and two shocks were not successful in converting the patient to rhythm. Manual massage of the heart was performed as we rapidly placed a pursestring and placed the cannulas for cardiopulmonary bypass.  The hemodynamics remained normal on cardiopulmonary bypass with minimal additional bicarbonate needed or pressors.  The coronaries were dissected and the vessels for  grafting were identified.  Cardioplegia cannulas were placed for both antegrade aortic  and retrograde coronary sinus cardioplegia.  An extensive period of time was spent on bypass, trying to find a native vein from the right leg, but only one segment was found.  For that reason, cryopreserved human allograft vein was prepared in a standard  fashion to provide adequate conduit.  The patient's short, obese stature made exposure of the mammary artery impossible and with the cannulas in place due to her sudden VFib event, exposure was not adequate to attempt safe harvest of the mammary artery.  When the conduit was ready, the patient was cooled to 32 degrees.  The crossclamp was applied.  A liter of cold blood cardioplegia was delivered in split doses between the antegrade aortic and retrograde coronary sinus catheters.  There was good  cardioplegic arrest and septal temperature dropped less than 14 degrees.  Cardioplegia was delivered every 20 minutes or less.  The distal coronary anastomoses were performed.  The first distal anastomosis was to the right coronary artery, posterior descending branch.  This was 1.2 mm vessel.  When I made the arteriotomy, there was a thrombus within the lumen and this was  extracted carefully in a single piece from both the proximal and distal aspect of the posterior descending vessel.  It was irrigated gently and then the reversed cryopreserved vein was  sewn end-to-side with running 7-0 Prolene.  There was good flow  through the graft.  Cardioplegia was redosed.  The second distal anastomosis was to the distal LAD.  The native saphenous vein to the right leg was then used for this anastomosis.  The LAD was 1.4 mm vessel with a proximal left main stenosis of 80% to 90% and a reverse saphenous vein anastomosis was  created with a running 7-0 Prolene with good flow through the graft.  Cardioplegia was redosed.  The third and fourth distal anastomoses were then completed to  the OM1 and OM2 using a sequential saphenous vein cryopreserved conduit.  First, the OM1 was grafted with a side-to-side anastomosis with running 7-0 Prolene.  The vein was then extended to  the OM2 and an end-to-side anastomosis with running 7-0 Prolene.  There was good flow through the sequential vein graft and cardioplegia was redosed.  While the crossclamp was still in place, 3 proximal vein anastomoses were performed using a 4.5 mm punch and running 6-0 Prolene.  Prior to tying down the final proximal anastomosis, air was vented from the coronaries and the left side of the heart using  the usual de-airing maneuvers on bypass.  The cross-clamp was removed.  The vein grafts were de-aired and opened.  Each had good flow and hemostasis was documented at the proximal and distal sites.  The patient was rewarmed and reperfused. The patient did not need to be shocked after release of the crossclamp and resumed a  non-paced rhythm.  Temporary pacing wires were applied.  The patient was started on low to medium dose inotropic support including milrinone, norepinephrine and epinephrine.  The balloon pump was reinitiated at a 1:1 augmentation and the ventilator was  resumed after the lungs were expanded.  The patient was weaned from cardiopulmonary bypass on the first attempt without difficulty.  Echo showed very good LV function and some mild RV dysfunction, although visual inspection of the RV appeared to be  fairly well maintained and only mild dysfunction.  Off bypass, the cardiac output was 5 liters per minute with a balloon pump and low to medium dose inotropic support.  Protamine was administered without adverse reaction.  The cannulas were removed.   There was still coagulopathy and because of preoperative bivalirudin, I felt that the patient should be transfused and needed the transfusion of FFP.  We also gave a unit of platelets because of the platelet inhibitor that the patient had been on  overnight  and the coagulopathy that precluded closing the chest.  After the factors were provided and extensive efforts made to achieve hemostasis mainly from bleeding from the chest wall and the frail sternum and the mediastinal fat, we placed an  anterior and posterior drain in the mediastinum and a right pleural chest tube as well.  The superior mediastinal fat was closed over the aorta and vein grafts.  We then placed the sternal wires through the very fragile sternum and there was some  evidence that the wires were trying to tear through the manubrium and great care was taken to establish good sternal closure with wires.  The sternal wires were placed and secured and the patient remained stable.  The pectoralis fascia was closed with a  running #1 Vicryl.  Subcutaneous and skin layers were closed with running Vicryl.  As the skin was being closed, the patient had a sudden drop in blood pressure, which coincided with infusion of the last FFP transfusion and this was  stopped and the  patient received one dose of steroids and one dose of epinephrine with prompt return of normal hemodynamics associated with normal-appearing LV function on the transesophageal echo.  Sterile dressings were then applied.  The patient had a chest x-ray in  the operating room, which I saw and the balloon pump position was adjusted to the appropriate position after the x-rays. The patient then returned to the ICU in a critical, but stable condition.     PAA D: 10/27/2021 5:52:47 pm T: 10/28/2021 12:56:00 am  JOB: [id number]/ [id number]

## 2021-10-29 ENCOUNTER — Inpatient Hospital Stay (HOSPITAL_COMMUNITY): Payer: Medicare Other

## 2021-10-29 LAB — POCT I-STAT 7, (LYTES, BLD GAS, ICA,H+H)
Acid-Base Excess: 0 mmol/L (ref 0.0–2.0)
Acid-Base Excess: 1 mmol/L (ref 0.0–2.0)
Acid-Base Excess: 0 mmol/L (ref 0.0–2.0)
Acid-Base Excess: 2 mmol/L (ref 0.0–2.0)
Acid-Base Excess: 2 mmol/L (ref 0.0–2.0)
Bicarbonate: 24.4 mmol/L (ref 20.0–28.0)
Bicarbonate: 25.7 mmol/L (ref 20.0–28.0)
Bicarbonate: 24.7 mmol/L (ref 20.0–28.0)
Bicarbonate: 25.8 mmol/L (ref 20.0–28.0)
Bicarbonate: 26.6 mmol/L (ref 20.0–28.0)
Calcium, Ion: 1.12 mmol/L — ABNORMAL LOW (ref 1.15–1.40)
Calcium, Ion: 1.14 mmol/L — ABNORMAL LOW (ref 1.15–1.40)
Calcium, Ion: 1.11 mmol/L — ABNORMAL LOW (ref 1.15–1.40)
Calcium, Ion: 1.16 mmol/L (ref 1.15–1.40)
Calcium, Ion: 1.12 mmol/L — ABNORMAL LOW (ref 1.15–1.40)
HCT: 27 % — ABNORMAL LOW (ref 36.0–46.0)
HCT: 25 % — ABNORMAL LOW (ref 36.0–46.0)
HCT: 22 % — ABNORMAL LOW (ref 36.0–46.0)
HCT: 21 % — ABNORMAL LOW (ref 36.0–46.0)
HCT: 22 % — ABNORMAL LOW (ref 36.0–46.0)
Hemoglobin: 9.2 g/dL — ABNORMAL LOW (ref 12.0–15.0)
Hemoglobin: 8.5 g/dL — ABNORMAL LOW (ref 12.0–15.0)
Hemoglobin: 7.5 g/dL — ABNORMAL LOW (ref 12.0–15.0)
Hemoglobin: 7.1 g/dL — ABNORMAL LOW (ref 12.0–15.0)
Hemoglobin: 7.5 g/dL — ABNORMAL LOW (ref 12.0–15.0)
O2 Saturation: 96 %
O2 Saturation: 97 %
O2 Saturation: 98 %
O2 Saturation: 97 %
O2 Saturation: 98 %
Patient temperature: 37.6
Patient temperature: 37.3
Patient temperature: 37.1
Patient temperature: 37
Patient temperature: 37.1
Potassium: 4.3 mmol/L (ref 3.5–5.1)
Potassium: 3.9 mmol/L (ref 3.5–5.1)
Potassium: 3.8 mmol/L (ref 3.5–5.1)
Potassium: 3.7 mmol/L (ref 3.5–5.1)
Potassium: 3.9 mmol/L (ref 3.5–5.1)
Sodium: 140 mmol/L (ref 135–145)
Sodium: 140 mmol/L (ref 135–145)
Sodium: 141 mmol/L (ref 135–145)
Sodium: 139 mmol/L (ref 135–145)
Sodium: 140 mmol/L (ref 135–145)
TCO2: 26 mmol/L (ref 22–32)
TCO2: 27 mmol/L (ref 22–32)
TCO2: 26 mmol/L (ref 22–32)
TCO2: 27 mmol/L (ref 22–32)
TCO2: 28 mmol/L (ref 22–32)
pCO2 arterial: 38.2 mmHg (ref 32.0–48.0)
pCO2 arterial: 40.3 mmHg (ref 32.0–48.0)
pCO2 arterial: 37.6 mmHg (ref 32.0–48.0)
pCO2 arterial: 37.8 mmHg (ref 32.0–48.0)
pCO2 arterial: 39.7 mmHg (ref 32.0–48.0)
pH, Arterial: 7.416 (ref 7.350–7.450)
pH, Arterial: 7.415 (ref 7.350–7.450)
pH, Arterial: 7.426 (ref 7.350–7.450)
pH, Arterial: 7.442 (ref 7.350–7.450)
pH, Arterial: 7.434 (ref 7.350–7.450)
pO2, Arterial: 86 mmHg (ref 83.0–108.0)
pO2, Arterial: 94 mmHg (ref 83.0–108.0)
pO2, Arterial: 95 mmHg (ref 83.0–108.0)
pO2, Arterial: 85 mmHg (ref 83.0–108.0)
pO2, Arterial: 94 mmHg (ref 83.0–108.0)

## 2021-10-29 LAB — GLUCOSE, CAPILLARY
Glucose-Capillary: 145 mg/dL — ABNORMAL HIGH (ref 70–99)
Glucose-Capillary: 176 mg/dL — ABNORMAL HIGH (ref 70–99)
Glucose-Capillary: 156 mg/dL — ABNORMAL HIGH (ref 70–99)
Glucose-Capillary: 154 mg/dL — ABNORMAL HIGH (ref 70–99)
Glucose-Capillary: 131 mg/dL — ABNORMAL HIGH (ref 70–99)
Glucose-Capillary: 128 mg/dL — ABNORMAL HIGH (ref 70–99)

## 2021-10-29 LAB — PREPARE RBC (CROSSMATCH)

## 2021-10-29 LAB — BASIC METABOLIC PANEL
Anion gap: 10 (ref 5–15)
Anion gap: 9 (ref 5–15)
BUN: 31 mg/dL — ABNORMAL HIGH (ref 8–23)
BUN: 29 mg/dL — ABNORMAL HIGH (ref 8–23)
CO2: 23 mmol/L (ref 22–32)
CO2: 25 mmol/L (ref 22–32)
Calcium: 7.7 mg/dL — ABNORMAL LOW (ref 8.9–10.3)
Calcium: 7.7 mg/dL — ABNORMAL LOW (ref 8.9–10.3)
Chloride: 106 mmol/L (ref 98–111)
Chloride: 106 mmol/L (ref 98–111)
Creatinine, Ser: 0.88 mg/dL (ref 0.44–1.00)
Creatinine, Ser: 0.81 mg/dL (ref 0.44–1.00)
GFR, Estimated: >60 mL/min (ref >60)
GFR, Estimated: >60 mL/min (ref >60)
Glucose, Bld: 158 mg/dL — ABNORMAL HIGH (ref 70–99)
Glucose, Bld: 164 mg/dL — ABNORMAL HIGH (ref 70–99)
Potassium: 4.3 mmol/L (ref 3.5–5.1)
Potassium: 3.8 mmol/L (ref 3.5–5.1)
Sodium: 139 mmol/L (ref 135–145)
Sodium: 140 mmol/L (ref 135–145)

## 2021-10-29 LAB — HEPATIC FUNCTION PANEL
ALT: 42 U/L (ref 0–44)
AST: 79 U/L — ABNORMAL HIGH (ref 15–41)
Albumin: 3 g/dL — ABNORMAL LOW (ref 3.5–5.0)
Alkaline Phosphatase: 101 U/L (ref 38–126)
Bilirubin, Direct: 0.1 mg/dL (ref 0.0–0.2)
Indirect Bilirubin: 0.4 mg/dL (ref 0.3–0.9)
Total Bilirubin: 0.5 mg/dL (ref 0.3–1.2)
Total Protein: 5 g/dL — ABNORMAL LOW (ref 6.5–8.1)

## 2021-10-29 LAB — PHOSPHORUS: Phosphorus: 2.4 mg/dL — ABNORMAL LOW (ref 2.5–4.6)

## 2021-10-29 LAB — CBC
HCT: 25 % — ABNORMAL LOW (ref 36.0–46.0)
HCT: 25.1 % — ABNORMAL LOW (ref 36.0–46.0)
Hemoglobin: 8.4 g/dL — ABNORMAL LOW (ref 12.0–15.0)
Hemoglobin: 8.6 g/dL — ABNORMAL LOW (ref 12.0–15.0)
MCH: 30.4 pg (ref 26.0–34.0)
MCH: 31.2 pg (ref 26.0–34.0)
MCHC: 33.6 g/dL (ref 30.0–36.0)
MCHC: 34.3 g/dL (ref 30.0–36.0)
MCV: 90.6 fL (ref 80.0–100.0)
MCV: 90.9 fL (ref 80.0–100.0)
Platelets: 149 10*3/uL — ABNORMAL LOW (ref 150–400)
Platelets: 124 10*3/uL — ABNORMAL LOW (ref 150–400)
RBC: 2.76 MIL/uL — ABNORMAL LOW (ref 3.87–5.11)
RBC: 2.76 MIL/uL — ABNORMAL LOW (ref 3.87–5.11)
RDW: 16 % — ABNORMAL HIGH (ref 11.5–15.5)
RDW: 16.5 % — ABNORMAL HIGH (ref 11.5–15.5)
WBC: 13.1 10*3/uL — ABNORMAL HIGH (ref 4.0–10.5)
WBC: 11.4 10*3/uL — ABNORMAL HIGH (ref 4.0–10.5)
nRBC: 6.2 % — ABNORMAL HIGH (ref 0.0–0.2)
nRBC: 5.3 % — ABNORMAL HIGH (ref 0.0–0.2)

## 2021-10-29 LAB — MAGNESIUM: Magnesium: 2.3 mg/dL (ref 1.7–2.4)

## 2021-10-29 LAB — COOXEMETRY PANEL
Carboxyhemoglobin: 1.1 % (ref 0.5–1.5)
Methemoglobin: 1.4 % (ref 0.0–1.5)
O2 Saturation: 56.9 %
Total hemoglobin: 8.2 g/dL — ABNORMAL LOW (ref 12.0–16.0)

## 2021-10-29 LAB — TRANSFUSION REACTION
DAT C3: NEGATIVE
Post RXN DAT IgG: NEGATIVE

## 2021-10-29 LAB — TRIGLYCERIDES: Triglycerides: 137 mg/dL (ref <150)

## 2021-10-29 LAB — HEMOGLOBIN AND HEMATOCRIT, BLOOD
HCT: 22.6 % — ABNORMAL LOW (ref 36.0–46.0)
Hemoglobin: 7.6 g/dL — ABNORMAL LOW (ref 12.0–15.0)

## 2021-10-29 LAB — PATHOLOGIST SMEAR REVIEW

## 2021-10-29 LAB — PROTIME-INR
INR: 1.1 (ref 0.8–1.2)
Prothrombin Time: 14.7 seconds (ref 11.4–15.2)

## 2021-10-29 LAB — APTT: aPTT: 26 seconds (ref 24–36)

## 2021-10-29 MED ORDER — GABAPENTIN 250 MG/5ML PO SOLN
400.0000 mg | Freq: Two times a day (BID) | ORAL | Status: DC
  Administered 2021-10-29: 400 mg
  Filled 2021-10-29 (×2): qty 8

## 2021-10-29 MED ORDER — PANTOPRAZOLE 2 MG/ML SUSPENSION
40.0000 mg | Freq: Every day | ORAL | Status: DC
  Administered 2021-10-29 – 2021-11-02 (×5): 40 mg
  Filled 2021-10-29 (×5): qty 20

## 2021-10-29 MED ORDER — ENOXAPARIN SODIUM 40 MG/0.4ML IJ SOSY
40.0000 mg | PREFILLED_SYRINGE | INTRAMUSCULAR | Status: DC
  Administered 2021-10-29 – 2021-11-11 (×14): 40 mg via SUBCUTANEOUS
  Filled 2021-10-29 (×13): qty 0.4

## 2021-10-29 MED ORDER — ALBUMIN HUMAN 5 % IV SOLN
12.5000 g | Freq: Once | INTRAVENOUS | Status: AC
  Administered 2021-10-29: 12.5 g via INTRAVENOUS

## 2021-10-29 MED ORDER — QUETIAPINE FUMARATE 100 MG PO TABS
200.0000 mg | ORAL_TABLET | Freq: Every day | ORAL | Status: DC
  Administered 2021-10-29: 200 mg
  Filled 2021-10-29: qty 2

## 2021-10-29 MED ORDER — POTASSIUM CHLORIDE 10 MEQ/50ML IV SOLN
10.0000 meq | INTRAVENOUS | Status: AC
  Administered 2021-10-29 (×2): 10 meq via INTRAVENOUS
  Filled 2021-10-29 (×2): qty 50

## 2021-10-29 MED ORDER — METOLAZONE 5 MG PO TABS: 10.0000 mg | ORAL_TABLET | Freq: Every day | ORAL | Status: DC

## 2021-10-29 MED ORDER — SENNOSIDES 8.8 MG/5ML PO SYRP
5.0000 mL | ORAL_SOLUTION | Freq: Every day | ORAL | Status: DC
  Administered 2021-10-29 – 2021-11-02 (×5): 5 mL
  Filled 2021-10-29 (×5): qty 5

## 2021-10-29 MED ORDER — ALBUMIN HUMAN 5 % IV SOLN
INTRAVENOUS | Status: AC
  Filled 2021-10-29: qty 250

## 2021-10-29 MED ORDER — METOLAZONE 5 MG PO TABS
10.0000 mg | ORAL_TABLET | Freq: Every day | ORAL | Status: DC
  Administered 2021-10-29 – 2021-11-02 (×5): 10 mg
  Filled 2021-10-29 (×5): qty 2

## 2021-10-29 MED FILL — Sodium Chloride IV Soln 0.9%: INTRAVENOUS | Qty: 3000 | Status: AC

## 2021-10-29 MED FILL — Sodium Bicarbonate IV Soln 8.4%: INTRAVENOUS | Qty: 50 | Status: AC

## 2021-10-29 MED FILL — Electrolyte-R (PH 7.4) Solution: INTRAVENOUS | Qty: 3000 | Status: AC

## 2021-10-29 MED FILL — Heparin Sodium (Porcine) Inj 1000 Unit/ML: INTRAMUSCULAR | Qty: 10 | Status: AC

## 2021-10-29 MED FILL — Albumin, Human Inj 5%: INTRAVENOUS | Qty: 250 | Status: AC

## 2021-10-29 MED FILL — Calcium Chloride Inj 10%: INTRAVENOUS | Qty: 10 | Status: AC

## 2021-10-29 MED FILL — Mannitol IV Soln 20%: INTRAVENOUS | Qty: 500 | Status: AC

## 2021-10-29 MED FILL — Lidocaine HCl (Cardiac) IV PF Soln 100 MG/5ML (2%): INTRAVENOUS | Qty: 5 | Status: AC

## 2021-10-29 NOTE — Plan of Care (Signed)
°  Problem: Respiratory: Goal: Respiratory status will improve Outcome: Progressing   Problem: Cardiac: Goal: Will achieve and/or maintain hemodynamic stability Outcome: Progressing   Problem: Urinary Elimination: Goal: Ability to achieve and maintain adequate renal perfusion and functioning will improve Outcome: Progressing

## 2021-10-29 NOTE — Progress Notes (Signed)
EVENING ROUNDS NOTE :     [address]       [place] [address]             [phone]                 2 Days Post-Op Procedure(s) (LRB): CORONARY ARTERY BYPASS GRAFTING (CABG) TIMES FOUR, USING LEFT INTERNAL MAMMARY ARTERY AND RIGHT LEG GREATER SAPHENOUS VEIN HARVESTED ENDOSCOPICALLY AND RIGHT CORONARY THROMBECTOMY (N/A) TRANSESOPHAGEAL ECHOCARDIOGRAM (TEE) (N/A) APPLICATION OF CELL SAVER ENDOVEIN HARVEST OF GREATER SAPHENOUS VEIN (Right)   Total Length of Stay:  LOS: 3 days  Events:   No events Good aug on IABP On milr, epi, levo, and amio    BP 93/63    Pulse 98    Temp 98.6 F (37 C)    Resp 17    Ht 4\' 11"  (1.499 m)    Wt 122.1 kg    SpO2 97%    BMI 54.37 kg/m   PAP: (31-56)/(14-35) 32/17 CVP:  [9 mmHg-32 mmHg] 13 mmHg CO:  [4.6 L/min-6.1 L/min] 4.6 L/min CI:  [2.3 L/min/m2-3 L/min/m2] 2.3 L/min/m2  Vent Mode: SIMV;PRVC;PSV FiO2 (%):  [40 %] 40 % Set Rate:  [10 bmp-16 bmp] 10 bmp Vt Set:  [620 mL] 620 mL PEEP:  [5 cmH20] 5 cmH20 Pressure Support:  [10 cmH20] 10 cmH20 Plateau Pressure:  [17 cmH20-19 cmH20] 17 cmH20   sodium chloride Stopped (10/28/21 1408)   sodium chloride 20 mL/hr at 10/27/21 1621   sodium chloride 10 mL/hr at 10/29/21 1700   amiodarone 30 mg/hr (10/29/21 1713)   epinephrine 9 mcg/min (10/29/21 1700)   lactated ringers     milrinone 0.3 mcg/kg/min (10/29/21 1700)   norepinephrine (LEVOPHED) Adult infusion 18 mcg/min (10/29/21 1700)   propofol (DIPRIVAN) infusion 35 mcg/kg/min (10/29/21 1700)    I/O last 3 completed shifts: In: 5841.4 [I.V.:4044.1; Blood:675; NG/GT:260; IV Piggyback:862.3] Out: 2885 [Urine:2075; Drains:180; Chest Tube:630]   CBC Latest Ref Rng & Units 10/29/2021 10/29/2021 10/29/2021  WBC 4.0 - 10.5 K/uL - - -  Hemoglobin 12.0 - 15.0 g/dL 7.5(L) 7.6(L) 7.1(L)  Hematocrit 36.0 - 46.0 % 22.0(L) 22.6(L) 21.0(L)  Platelets 150 - 400 K/uL - - -    BMP Latest Ref Rng & Units 10/29/2021 10/29/2021 10/29/2021   Glucose 70 - 99 mg/dL - 164(H) -  BUN 8 - 23 mg/dL - 29(H) -  Creatinine 0.44 - 1.00 mg/dL - 0.81 -  Sodium 135 - 145 mmol/L 140 140 139  Potassium 3.5 - 5.1 mmol/L 3.9 3.8 3.7  Chloride 98 - 111 mmol/L - 106 -  CO2 22 - 32 mmol/L - 25 -  Calcium 8.9 - 10.3 mg/dL - 7.7(L) -    ABG    Component Value Date/Time   PHART 7.434 10/29/2021 1546   PCO2ART 39.7 10/29/2021 1546   PO2ART 94 10/29/2021 1546   HCO3 26.6 10/29/2021 1546   TCO2 28 10/29/2021 1546   ACIDBASEDEF 1.0 10/28/2021 0036   O2SAT 98.0 10/29/2021 [place], MD 10/29/2021 5:50 PM

## 2021-10-29 NOTE — Progress Notes (Signed)
Initial Nutrition Assessment  DOCUMENTATION CODES:   Morbid obesity  INTERVENTION:   Recommend abdominal xray to better assess enteric tube tip given hx of gastric bypass. Plan for Cortrak tomorrow (discussed with TCTS) if unable to extubate  Spoke with TCTS and plan to hold on initiation of TF today  Tube Feeding Recommendations: Vital High Protein at 20 ml/hr with goal rate of 55 ml/hr Provides 1320 kcals, 116 g of protein and 1109 mL of free water  TF regimen at goal rate and propofol at current rate woul provide 1808 total kcal/day   Recommend addition of appropriate bariatric vitamin regimen when able   NUTRITION DIAGNOSIS:   Inadequate oral intake related to acute illness as evidenced by NPO status.  GOAL:   Patient will meet greater than or equal to 90% of their needs  MONITOR:   Vent status, Labs, Weight trends  REASON FOR ASSESSMENT:   Ventilator    ASSESSMENT:   71 yo female admitted with acute STEMI requiring IABP and emergent CABG. PMH includes morbid obesity, HTN, gastric bypass 2003, GERD, depression, hx of breast cancer  2/06 Admitted, VT arrest, IABP inserted 2/07 emergent CABG x 4  Pt remains on vent support, did not tolerate vent wean today Propofol: 18.5 ml/hr  NPO day 3  Noted pt with hx of gastric bypass per surgical hx in chart Unable to obtain diet and weight history from patient at this time  Home meds include: B complex, calcium carbonate 600 mg BID, ferrous sulfate, Vit D, MVI with Iron  Weight up postop; current wt 122.1 kg; admit wt 110 kg. Net +9 L; UOP 1.5 L in previous 24 hours; 1.5 L thus far today  Labs: reviewed Meds: lasix, ss novolog, levemir, senokot, reglan, lasix, colace  Diet Order:   Diet Order     None       EDUCATION NEEDS:   Not appropriate for education at this time  Skin:  Skin Assessment: Reviewed RN Assessment  Last BM:  2/7  Height:   Ht Readings from Last 1 Encounters:  10/27/21 4\' 11"   (1.499 m)    Weight:   Wt Readings from Last 1 Encounters:  10/29/21 122.1 kg   BMI:  Body mass index is 54.37 kg/m.  Estimated Nutritional Needs:   Kcal:  1250-1550 kcals  Protein:  90-115 g  Fluid:  >= 1.5 L    Kerman Passey MS, RDN, LDN, CNSC Registered Dietitian III Clinical Nutrition RD Pager and On-Call Pager Number Located in [place]

## 2021-10-29 NOTE — Progress Notes (Addendum)
T[facility] DAILY ICU PROGRESS NOTE                   [address]            [place] [address]          [phone]   2 Days Post-Op Procedure(s) (LRB): CORONARY ARTERY BYPASS GRAFTING (CABG) TIMES FOUR, USING LEFT INTERNAL MAMMARY ARTERY AND RIGHT LEG GREATER SAPHENOUS VEIN HARVESTED ENDOSCOPICALLY AND RIGHT CORONARY THROMBECTOMY (N/A) TRANSESOPHAGEAL ECHOCARDIOGRAM (TEE) (N/A) APPLICATION OF CELL SAVER ENDOVEIN HARVEST OF GREATER SAPHENOUS VEIN (Right)  Total Length of Stay:  LOS: 3 days   Subjective: Sedated with propofol and mechanically ventilated.  Will awaken to voice.  Milrinone 0.71mg/kg/min Epi  149m/min Levo  1144mmin IABP   1:2 since last PM CI  2.2-2.6 CVP   75m51mCoOx  49>51>57 over past 24 hours  Objective: Vital signs in last 24 hours: Temp:  [97.7 F (36.5 C)-100.4 F (38 C)] 99.3 F (37.4 C) (02/09 0800) Pulse Rate:  [96-100] 100 (02/09 0726) Cardiac Rhythm: A-V Sequential paced (02/08 2215) Resp:  [15-27] 16 (02/09 0800) BP: (74-163)/(36-151) 110/62 (02/09 0800) SpO2:  [74 %-100 %] 97 % (02/09 0800) FiO2 (%):  [40 %] 40 % (02/09 0726) Weight:  [122.1 kg] 122.1 kg (02/09 0500)  Filed Weights   10/26/21 2349 10/28/21 0645 10/29/21 0500  Weight: 110 kg 123.4 kg 122.1 kg    Weight change: -1.3 kg   Hemodynamic parameters for last 24 hours: PAP: (28-56)/(16-35) 38/22 CVP:  [9 mmHg-32 mmHg] 14 mmHg CO:  [4.8 L/min-6.1 L/min] 5.5 L/min CI:  [2.4 L/min/m2-3 L/min/m2] 2.8 L/min/m2  Intake/Output from previous day: 02/08 0701 - 02/09 0700 In: 3092.6 [I.V.:2317.6; Blood:315; NG/GT:260; IV Piggyback:200] Out: 2156 [Urine:1580; Drains:76; Chest Tube:500]  Intake/Output this shift: Total I/O In: 275.8 [I.V.:175.8; IV Piggyback:100] Out: 158 [Urine:120; Drains:8; Chest Tube:30]  Current Meds: Scheduled Meds:  acetaminophen  1,000 mg Oral Q6H   Or   acetaminophen (TYLENOL) oral liquid 160 mg/5 mL  1,000 mg Per Tube Q6H   aspirin  EC  325 mg Oral Daily   Or   aspirin  324 mg Per Tube Daily   bisacodyl  10 mg Oral Daily   Or   bisacodyl  10 mg Rectal Daily   chlorhexidine gluconate (MEDLINE KIT)  15 mL Mouth Rinse BID   Chlorhexidine Gluconate Cloth  6 each Topical Daily   docusate  200 mg Per Tube Daily   furosemide  40 mg Intravenous BID   insulin aspart  0-24 Units Subcutaneous Q4H   insulin detemir  12 Units Subcutaneous BID   mouth rinse  15 mL Mouth Rinse 10 times per day   metoCLOPramide (REGLAN) injection  10 mg Intravenous Once   metoCLOPramide (REGLAN) injection  10 mg Intravenous Q6H   pantoprazole  40 mg Oral Daily   sodium chloride flush  3 mL Intravenous Q12H   Continuous Infusions:  sodium chloride Stopped (10/28/21 1408)   sodium chloride Stopped (10/28/21 0815)   sodium chloride 20 mL/hr at 10/27/21 1621   sodium chloride 10 mL/hr at 10/29/21 0800   amiodarone 30 mg/hr (10/29/21 0800)   epinephrine 11 mcg/min (10/29/21 0800)   lactated ringers     milrinone 0.3 mcg/kg/min (10/29/21 0800)   norepinephrine (LEVOPHED) Adult infusion 10 mcg/min (10/29/21 0800)   propofol (DIPRIVAN) infusion 35 mcg/kg/min (10/29/21 0800)   PRN Meds:.sodium chloride, metoprolol tartrate, midazolam, morphine injection, ondansetron (ZOFRAN) IV, oxyCODONE, sodium chloride flush,  traMADol  General appearance: Sedated, on vent but awakens to voice Neurologic: sedated but awakens to voice and moves extremities to commands Heart: Now in SR with 1st degree AVB.   Lungs: breath sounds are clear with good air movement bilat. CXR showing increase in diffuse bilateral lung opacities. Tip of IABP in the mid descending aorta. The Et tube a d PA catheter in good position.   Abdomen: firm, no apparent tenderness. Absent bowel sounds Extremities: Warm with good distal perfusion throughout. IABP in right femoral artery, right femoral venous sheath remains in place. Strong Doppler DP pulses bilat. Wound: the sternotomy incision is  covered with a dry Aquacel dressing and breast binder. The rith EVH incisions are covered with dry dressings. The RLE JP drain had ~44m sero-sanguinous fluid past 24 hours.   Lab Results: CBC: Recent Labs    10/28/21 1619 10/29/21 0500 10/29/21 0518  WBC 13.1* 13.1*  --   HGB 8.2* 8.4* 9.2*  HCT 24.0* 25.0* 27.0*  PLT 145* 149*  --    BMET:  Recent Labs    10/28/21 1619 10/29/21 0500 10/29/21 0518  NA 139 139 140  K 4.3 4.3 4.3  CL 108 106  --   CO2 23 23  --   GLUCOSE 111* 158*  --   BUN 36* 31*  --   CREATININE 0.97 0.88  --   CALCIUM 7.7* 7.7*  --     CMET: Lab Results  Component Value Date   WBC 13.1 (H) 10/29/2021   HGB 9.2 (L) 10/29/2021   HCT 27.0 (L) 10/29/2021   PLT 149 (L) 10/29/2021   GLUCOSE 158 (H) 10/29/2021   CHOL 154 10/26/2021   TRIG 137 10/29/2021   HDL 55 10/26/2021   LDLCALC 70 10/26/2021   ALT 42 10/29/2021   AST 79 (H) 10/29/2021   NA 140 10/29/2021   K 4.3 10/29/2021   CL 106 10/29/2021   CREATININE 0.88 10/29/2021   BUN 31 (H) 10/29/2021   CO2 23 10/29/2021   TSH 2.819 10/15/2020   INR 1.1 10/29/2021   HGBA1C 5.3 10/26/2021      PT/INR:  Recent Labs    10/29/21 0500  LABPROT 14.7  INR 1.1   Radiology: No results found.   Assessment/Plan: S/P Procedure(s) (LRB): CORONARY ARTERY BYPASS GRAFTING (CABG) TIMES FOUR, USING LEFT INTERNAL MAMMARY ARTERY AND RIGHT LEG GREATER SAPHENOUS VEIN HARVESTED ENDOSCOPICALLY AND RIGHT CORONARY THROMBECTOMY (N/A) TRANSESOPHAGEAL ECHOCARDIOGRAM (TEE) (N/A) APPLICATION OF CELL SAVER ENDOVEIN HARVEST OF GREATER SAPHENOUS VEIN (Right)  -POD2 Emergency CABG x 4 after presenting with acute STEMI. Currently supported with IABP at 1:2 and  Epi, milrinone, Levo.  CoOx improved to 57.  Expect we can remove the IABP today.   Continue inotropic support and wean the NE  as tolerated.     -Peri-op VF and post-op A-fib- Now in stable SR with 1st degree AVB. On amiodarone IV at 330mhr.   K+ 4.3, Mg++  2.9.  -PULM- oxygenation adequate on FiO2 0.4.  Has significant volume excess with Wt 14kg+ since surgery and CVP 16. CXR showing increased bilat opacities. Need to off-load more excess volume before extubation.   -HEME- expected acute blood loss anemia. Post-op coagulopathy corrected, INR 1.1 and Plt count OK.  CT drainage 50060mast 24 hours and RLE drainage is tapering off. Leave all drains in place today. Transfused 2 units PRBC's POD1 with appropriate response.  Monitor.   -RENAL- [facility] but Creat 2.1 on admission to [facility] on 2/6.  Now 0.8, UO adequate. Lasix 65m IV BID started last PM but I/O net positive by 1L yesterday.   -NEURO-sedated but responsive and moves extremities.   MAntony Odea PA-C [phone]/05/2022 8:17 AM  Vent wean on hold due to drop in BP with antianxiety meds, diuresis and anemia. Now stable on 16 mcg levophed, IABP 1:1 and getting 1 u PRBC Hold tube feeds due to ileus on KUB today  patient examined and medical record reviewed,agree with above note. PDahlia Byes2/05/2022

## 2021-10-30 ENCOUNTER — Inpatient Hospital Stay: Payer: Self-pay

## 2021-10-30 ENCOUNTER — Inpatient Hospital Stay (HOSPITAL_COMMUNITY): Payer: Medicare Other

## 2021-10-30 LAB — POCT I-STAT 7, (LYTES, BLD GAS, ICA,H+H)
Acid-Base Excess: 3 mmol/L — ABNORMAL HIGH (ref 0.0–2.0)
Acid-Base Excess: 3 mmol/L — ABNORMAL HIGH (ref 0.0–2.0)
Acid-Base Excess: 4 mmol/L — ABNORMAL HIGH (ref 0.0–2.0)
Acid-Base Excess: 4 mmol/L — ABNORMAL HIGH (ref 0.0–2.0)
Acid-Base Excess: 7 mmol/L — ABNORMAL HIGH (ref 0.0–2.0)
Acid-Base Excess: 4 mmol/L — ABNORMAL HIGH (ref 0.0–2.0)
Acid-Base Excess: 2 mmol/L (ref 0.0–2.0)
Bicarbonate: 27.1 mmol/L (ref 20.0–28.0)
Bicarbonate: 27.6 mmol/L (ref 20.0–28.0)
Bicarbonate: 28.7 mmol/L — ABNORMAL HIGH (ref 20.0–28.0)
Bicarbonate: 28.7 mmol/L — ABNORMAL HIGH (ref 20.0–28.0)
Bicarbonate: 30.9 mmol/L — ABNORMAL HIGH (ref 20.0–28.0)
Bicarbonate: 28.4 mmol/L — ABNORMAL HIGH (ref 20.0–28.0)
Bicarbonate: 27.9 mmol/L (ref 20.0–28.0)
Calcium, Ion: 1.14 mmol/L — ABNORMAL LOW (ref 1.15–1.40)
Calcium, Ion: 1.19 mmol/L (ref 1.15–1.40)
Calcium, Ion: 1.15 mmol/L (ref 1.15–1.40)
Calcium, Ion: 1.15 mmol/L (ref 1.15–1.40)
Calcium, Ion: 1.15 mmol/L (ref 1.15–1.40)
Calcium, Ion: 1.18 mmol/L (ref 1.15–1.40)
Calcium, Ion: 1.18 mmol/L (ref 1.15–1.40)
HCT: 21 % — ABNORMAL LOW (ref 36.0–46.0)
HCT: 25 % — ABNORMAL LOW (ref 36.0–46.0)
HCT: 27 % — ABNORMAL LOW (ref 36.0–46.0)
HCT: 27 % — ABNORMAL LOW (ref 36.0–46.0)
HCT: 25 % — ABNORMAL LOW (ref 36.0–46.0)
HCT: 25 % — ABNORMAL LOW (ref 36.0–46.0)
HCT: 29 % — ABNORMAL LOW (ref 36.0–46.0)
Hemoglobin: 7.1 g/dL — ABNORMAL LOW (ref 12.0–15.0)
Hemoglobin: 8.5 g/dL — ABNORMAL LOW (ref 12.0–15.0)
Hemoglobin: 9.2 g/dL — ABNORMAL LOW (ref 12.0–15.0)
Hemoglobin: 9.2 g/dL — ABNORMAL LOW (ref 12.0–15.0)
Hemoglobin: 8.5 g/dL — ABNORMAL LOW (ref 12.0–15.0)
Hemoglobin: 8.5 g/dL — ABNORMAL LOW (ref 12.0–15.0)
Hemoglobin: 9.9 g/dL — ABNORMAL LOW (ref 12.0–15.0)
O2 Saturation: 98 %
O2 Saturation: 98 %
O2 Saturation: 98 %
O2 Saturation: 97 %
O2 Saturation: 98 %
O2 Saturation: 98 %
O2 Saturation: 96 %
Patient temperature: 37.1
Patient temperature: 37.2
Patient temperature: 37.3
Patient temperature: 37.2
Patient temperature: 37
Patient temperature: 36.7
Patient temperature: 36.6
Potassium: 3.8 mmol/L (ref 3.5–5.1)
Potassium: 3.7 mmol/L (ref 3.5–5.1)
Potassium: 3.9 mmol/L (ref 3.5–5.1)
Potassium: 3.7 mmol/L (ref 3.5–5.1)
Potassium: 3.9 mmol/L (ref 3.5–5.1)
Potassium: 3.7 mmol/L (ref 3.5–5.1)
Potassium: 3.8 mmol/L (ref 3.5–5.1)
Sodium: 140 mmol/L (ref 135–145)
Sodium: 138 mmol/L (ref 135–145)
Sodium: 138 mmol/L (ref 135–145)
Sodium: 141 mmol/L (ref 135–145)
Sodium: 139 mmol/L (ref 135–145)
Sodium: 139 mmol/L (ref 135–145)
Sodium: 139 mmol/L (ref 135–145)
TCO2: 28 mmol/L (ref 22–32)
TCO2: 29 mmol/L (ref 22–32)
TCO2: 30 mmol/L (ref 22–32)
TCO2: 30 mmol/L (ref 22–32)
TCO2: 32 mmol/L (ref 22–32)
TCO2: 30 mmol/L (ref 22–32)
TCO2: 29 mmol/L (ref 22–32)
pCO2 arterial: 39.3 mmHg (ref 32.0–48.0)
pCO2 arterial: 42.8 mmHg (ref 32.0–48.0)
pCO2 arterial: 45.3 mmHg (ref 32.0–48.0)
pCO2 arterial: 41.4 mmHg (ref 32.0–48.0)
pCO2 arterial: 42.2 mmHg (ref 32.0–48.0)
pCO2 arterial: 41.9 mmHg (ref 32.0–48.0)
pCO2 arterial: 46 mmHg (ref 32.0–48.0)
pH, Arterial: 7.447 (ref 7.350–7.450)
pH, Arterial: 7.418 (ref 7.350–7.450)
pH, Arterial: 7.412 (ref 7.350–7.450)
pH, Arterial: 7.45 (ref 7.350–7.450)
pH, Arterial: 7.473 — ABNORMAL HIGH (ref 7.350–7.450)
pH, Arterial: 7.438 (ref 7.350–7.450)
pH, Arterial: 7.389 (ref 7.350–7.450)
pO2, Arterial: 97 mmHg (ref 83.0–108.0)
pO2, Arterial: 102 mmHg (ref 83.0–108.0)
pO2, Arterial: 104 mmHg (ref 83.0–108.0)
pO2, Arterial: 89 mmHg (ref 83.0–108.0)
pO2, Arterial: 97 mmHg (ref 83.0–108.0)
pO2, Arterial: 95 mmHg (ref 83.0–108.0)
pO2, Arterial: 83 mmHg (ref 83.0–108.0)

## 2021-10-30 LAB — BPAM RBC
Blood Product Expiration Date: 202302272359
Blood Product Expiration Date: 202302272359
Blood Product Expiration Date: 202302282359
Blood Product Expiration Date: 202303012359
Blood Product Expiration Date: 202303012359
Blood Product Expiration Date: 202303012359
Blood Product Expiration Date: 202303032359
ISSUE DATE / TIME: 202302070943
ISSUE DATE / TIME: 202302070943
ISSUE DATE / TIME: 202302071113
ISSUE DATE / TIME: 202302071716
ISSUE DATE / TIME: 202302080621
ISSUE DATE / TIME: 202302080857
ISSUE DATE / TIME: 202302091646
Unit Type and Rh: 6200
Unit Type and Rh: 6200
Unit Type and Rh: 6200
Unit Type and Rh: 6200
Unit Type and Rh: 6200
Unit Type and Rh: 6200
Unit Type and Rh: 6200

## 2021-10-30 LAB — GLUCOSE, CAPILLARY
Glucose-Capillary: 111 mg/dL — ABNORMAL HIGH (ref 70–99)
Glucose-Capillary: 77 mg/dL (ref 70–99)
Glucose-Capillary: 132 mg/dL — ABNORMAL HIGH (ref 70–99)
Glucose-Capillary: 79 mg/dL (ref 70–99)
Glucose-Capillary: 121 mg/dL — ABNORMAL HIGH (ref 70–99)
Glucose-Capillary: 118 mg/dL — ABNORMAL HIGH (ref 70–99)

## 2021-10-30 LAB — BASIC METABOLIC PANEL
Anion gap: 7 (ref 5–15)
Anion gap: 9 (ref 5–15)
Anion gap: 8 (ref 5–15)
BUN: 23 mg/dL (ref 8–23)
BUN: 20 mg/dL (ref 8–23)
BUN: 21 mg/dL (ref 8–23)
CO2: 27 mmol/L (ref 22–32)
CO2: 27 mmol/L (ref 22–32)
CO2: 27 mmol/L (ref 22–32)
Calcium: 7.7 mg/dL — ABNORMAL LOW (ref 8.9–10.3)
Calcium: 7.9 mg/dL — ABNORMAL LOW (ref 8.9–10.3)
Calcium: 7.9 mg/dL — ABNORMAL LOW (ref 8.9–10.3)
Chloride: 105 mmol/L (ref 98–111)
Chloride: 103 mmol/L (ref 98–111)
Chloride: 104 mmol/L (ref 98–111)
Creatinine, Ser: 0.69 mg/dL (ref 0.44–1.00)
Creatinine, Ser: 0.68 mg/dL (ref 0.44–1.00)
Creatinine, Ser: 0.66 mg/dL (ref 0.44–1.00)
GFR, Estimated: >60 mL/min (ref >60)
GFR, Estimated: >60 mL/min (ref >60)
GFR, Estimated: >60 mL/min (ref >60)
Glucose, Bld: 106 mg/dL — ABNORMAL HIGH (ref 70–99)
Glucose, Bld: 77 mg/dL (ref 70–99)
Glucose, Bld: 107 mg/dL — ABNORMAL HIGH (ref 70–99)
Potassium: 3.8 mmol/L (ref 3.5–5.1)
Potassium: 3.6 mmol/L (ref 3.5–5.1)
Potassium: 4 mmol/L (ref 3.5–5.1)
Sodium: 139 mmol/L (ref 135–145)
Sodium: 139 mmol/L (ref 135–145)
Sodium: 139 mmol/L (ref 135–145)

## 2021-10-30 LAB — TYPE AND SCREEN
ABO/RH(D): A POS
Antibody Screen: NEGATIVE
Unit division: 0
Unit division: 0
Unit division: 0
Unit division: 0
Unit division: 0
Unit division: 0
Unit division: 0

## 2021-10-30 LAB — HEPATIC FUNCTION PANEL
ALT: 26 U/L (ref 0–44)
AST: 50 U/L — ABNORMAL HIGH (ref 15–41)
Albumin: 2.7 g/dL — ABNORMAL LOW (ref 3.5–5.0)
Alkaline Phosphatase: 115 U/L (ref 38–126)
Bilirubin, Direct: 0.2 mg/dL (ref 0.0–0.2)
Indirect Bilirubin: 0.4 mg/dL (ref 0.3–0.9)
Total Bilirubin: 0.6 mg/dL (ref 0.3–1.2)
Total Protein: 4.9 g/dL — ABNORMAL LOW (ref 6.5–8.1)

## 2021-10-30 LAB — CBC
HCT: 25.7 % — ABNORMAL LOW (ref 36.0–46.0)
Hemoglobin: 8.7 g/dL — ABNORMAL LOW (ref 12.0–15.0)
MCH: 30.7 pg (ref 26.0–34.0)
MCHC: 33.9 g/dL (ref 30.0–36.0)
MCV: 90.8 fL (ref 80.0–100.0)
Platelets: 122 10*3/uL — ABNORMAL LOW (ref 150–400)
RBC: 2.83 MIL/uL — ABNORMAL LOW (ref 3.87–5.11)
RDW: 16.5 % — ABNORMAL HIGH (ref 11.5–15.5)
WBC: 12.1 10*3/uL — ABNORMAL HIGH (ref 4.0–10.5)
nRBC: 5 % — ABNORMAL HIGH (ref 0.0–0.2)

## 2021-10-30 LAB — COOXEMETRY PANEL
Carboxyhemoglobin: 1.1 % (ref 0.5–1.5)
Methemoglobin: 1.3 % (ref 0.0–1.5)
O2 Saturation: 67 %
Total hemoglobin: 8.8 g/dL — ABNORMAL LOW (ref 12.0–16.0)

## 2021-10-30 LAB — PROTIME-INR
INR: 1 (ref 0.8–1.2)
Prothrombin Time: 12.6 seconds (ref 11.4–15.2)

## 2021-10-30 LAB — T4, FREE: Free T4: 0.84 ng/dL (ref 0.61–1.12)

## 2021-10-30 LAB — MAGNESIUM: Magnesium: 2.2 mg/dL (ref 1.7–2.4)

## 2021-10-30 LAB — PHOSPHORUS: Phosphorus: 2.6 mg/dL (ref 2.5–4.6)

## 2021-10-30 LAB — TSH: TSH: 7.256 u[IU]/mL — ABNORMAL HIGH (ref 0.350–4.500)

## 2021-10-30 MED ORDER — POTASSIUM CHLORIDE 10 MEQ/50ML IV SOLN
10.0000 meq | INTRAVENOUS | Status: AC
  Administered 2021-10-30 (×3): 10 meq via INTRAVENOUS
  Filled 2021-10-30 (×3): qty 50

## 2021-10-30 MED ORDER — CLOPIDOGREL BISULFATE 75 MG PO TABS: 75.0000 mg | ORAL_TABLET | Freq: Every day | ORAL | Status: DC

## 2021-10-30 MED ORDER — TRAMADOL HCL 50 MG PO TABS
50.0000 mg | ORAL_TABLET | ORAL | Status: DC | PRN
  Administered 2021-10-30 – 2021-10-31 (×3): 100 mg
  Filled 2021-10-30 (×3): qty 2

## 2021-10-30 MED ORDER — MIDAZOLAM HCL 2 MG/2ML IJ SOLN: 1.0000 mg | Freq: Four times a day (QID) | INTRAMUSCULAR | Status: DC | PRN

## 2021-10-30 MED ORDER — POTASSIUM CHLORIDE 10 MEQ/50ML IV SOLN
10.0000 meq | INTRAVENOUS | Status: AC
  Administered 2021-10-30 (×2): 10 meq via INTRAVENOUS
  Filled 2021-10-30 (×2): qty 50

## 2021-10-30 MED ORDER — OXYCODONE HCL 5 MG/5ML PO SOLN
5.0000 mg | ORAL | Status: DC | PRN
  Administered 2021-10-30 – 2021-11-01 (×9): 10 mg
  Administered 2021-11-02 – 2021-11-03 (×2): 5 mg
  Filled 2021-10-30: qty 5
  Filled 2021-10-30 (×9): qty 10
  Filled 2021-10-30 (×2): qty 5

## 2021-10-30 MED ORDER — ASPIRIN EC 81 MG PO TBEC: 81.0000 mg | DELAYED_RELEASE_TABLET | Freq: Every day | ORAL | Status: DC

## 2021-10-30 MED ORDER — METOCLOPRAMIDE HCL 5 MG/ML IJ SOLN
10.0000 mg | Freq: Two times a day (BID) | INTRAMUSCULAR | Status: DC
  Administered 2021-10-30 – 2021-10-31 (×2): 10 mg via INTRAVENOUS
  Filled 2021-10-30 (×2): qty 2

## 2021-10-30 MED ORDER — MORPHINE SULFATE (PF) 2 MG/ML IV SOLN
2.0000 mg | INTRAVENOUS | Status: DC | PRN
  Administered 2021-10-30 – 2021-11-01 (×7): 2 mg via INTRAVENOUS
  Filled 2021-10-30 (×7): qty 1

## 2021-10-30 MED ORDER — ASPIRIN 325 MG PO TABS
325.0000 mg | ORAL_TABLET | Freq: Every day | ORAL | Status: DC
  Administered 2021-10-31 – 2021-11-02 (×3): 325 mg
  Filled 2021-10-30 (×3): qty 1

## 2021-10-30 MED ORDER — ASPIRIN 325 MG PO TABS: 325.0000 mg | ORAL_TABLET | Freq: Every day | ORAL | Status: DC

## 2021-10-30 MED ORDER — PROSOURCE TF PO LIQD
45.0000 mL | Freq: Two times a day (BID) | ORAL | Status: DC
  Administered 2021-10-30 – 2021-10-31 (×2): 45 mL
  Filled 2021-10-30 (×2): qty 45

## 2021-10-30 MED ORDER — VITAL 1.5 CAL PO LIQD
1000.0000 mL | ORAL | Status: DC
  Administered 2021-10-30: 1000 mL

## 2021-10-30 NOTE — Procedures (Signed)
Cortrak  Person Inserting Tube:  Esaw Dace, RD Tube Type:  Cortrak - 43 inches Tube Size:  10 Tube Location:  Left nare Initial Placement:  Stomach Secured by: Bridle Technique Used to Measure Tube Placement:  Marking at nare/corner of mouth Cortrak Secured At:  62 cm Procedure Comments:  Cortrak Tube Team Note:  Consult received to place a Cortrak feeding tube.   X-ray is required, abdominal x-ray has been ordered by the Cortrak team. Please confirm tube placement before using the Cortrak tube.   If the tube becomes dislodged please keep the tube and contact the Cortrak team at [email or web address] (password [email or web address]) for replacement.  If after hours and replacement cannot be delayed, place a NG tube and confirm placement with an abdominal x-ray.    Kerman Passey MS, RDN, LDN, CNSC Registered Dietitian III Clinical Nutrition RD Pager and On-Call Pager Number Located in [place]

## 2021-10-30 NOTE — Progress Notes (Addendum)
Nutrition Follow-up  DOCUMENTATION CODES:   Morbid obesity  INTERVENTION:   Once cortrak in place, initiate the following: Tube Feeding Recommendations: Vital 1.5 at 25 ml/hr. Advance by 36mL q6h to goal rate of 21mL/h Prosource TF 39mL BID (40kcal and 11g of protein per packet) Standard free water flush 90ml/h q4h Provides 1700kcals, 95 g of protein and 1005 mL of free water (TF+flush)  Monitor for signs of refeeding, as pt was unable to provide a nutrition intake hx and has been NPO throughout admission  Recommend addition of appropriate bariatric vitamin regimen when able  NUTRITION DIAGNOSIS:   Inadequate oral intake related to acute illness as evidenced by NPO status.  GOAL:   Patient will meet greater than or equal to 90% of their needs  MONITOR:   Vent status, Labs, Weight trends  REASON FOR ASSESSMENT:   Ventilator    ASSESSMENT:   71 yo female admitted with acute STEMI requiring IABP and emergent CABG. PMH includes morbid obesity, HTN, gastric bypass 2003, GERD, depression, hx of breast cancer  2/06 Admitted, VT arrest, IABP inserted 2/07 emergent CABG x 4 2/10 extubated, cortrak tube to be placed  Pt able to be extubated this afternoon. Discussed with RN and provider, pt very unlikely to be able to have diet advanced today due to confusion and overall status. Cortrak tube to be placed for enteral nutrition and medications  Pt resting in bed at the time of assessment, unable to provide a hx. Confused and repeating "help me." Will adjust nutrition needs as pt has been extubated.  Admit Weight: 109 kg Current Weight: 124.9 kg  Intake/Output Summary (Last 24 hours) at 10/30/2021 1547 Last data filed at 10/30/2021 1500 Gross per 24 hour  Intake 2274.35 ml  Output 4374 ml  Net -2099.65 ml  Net IO Since Admission: 6,267.66 mL [10/30/21 1547]  Nutritionally Relevant Medications: Scheduled Meds:  docusate  200 mg Per Tube Daily   furosemide  40 mg  Intravenous BID   insulin aspart  0-24 Units Subcutaneous Q4H   insulin detemir  12 Units Subcutaneous BID   REGLAN  10 mg Intravenous Q6H   pantoprazole sodium  40 mg Per Tube Daily   sennosides  5 mL Per Tube Daily   PRN Meds: ondansetron   Relevant Home meds include: B complex, calcium carbonate 600 mg BID, ferrous sulfate, Vit D, MVI with Iron  Labs Reviewed  Diet Order:   Diet Order     None       EDUCATION NEEDS:   Not appropriate for education at this time  Skin:  Skin Assessment: Reviewed RN Assessment  Last BM:  2/7  Height:   Ht Readings from Last 1 Encounters:  10/27/21 4\' 11"  (1.499 m)    Weight:   Wt Readings from Last 1 Encounters:  10/30/21 124.9 kg   BMI:  Body mass index is 55.61 kg/m.  Estimated Nutritional Needs:   Kcal:  1600-1800 kcal/d  Protein:  80-100  Fluid:  >= 1.5 L    Ranell Patrick, RD, LDN Clinical Dietitian RD pager # available in [place]  After hours/weekend pager # available in [facility]

## 2021-10-30 NOTE — Progress Notes (Signed)
Patient ID: Deborah Cooke, female   DOB: 04/12/1951, 71 y.o.   MRN: [id number] TCTS Evening Rounds:  Hemodynamically stable in sinus rhythm on IV amio.  Milrinone 0.3, IABP.  CI 2.3 Sats 99% 5 L [place]  UO ok

## 2021-10-30 NOTE — Progress Notes (Signed)
Dr. Darcey Nora at bedside while accessing for PICC placement. MD aware not PICC candidate at this moment due to lots of edema, cephalic uncompressible, and small veins measuring greater than 45%.

## 2021-10-30 NOTE — Progress Notes (Addendum)
TCTS DAILY ICU PROGRESS NOTE                   [address]            [place] [address]          [phone]   3 Days Post-Op Procedure(s) (LRB): CORONARY ARTERY BYPASS GRAFTING (CABG) TIMES FOUR, USING LEFT INTERNAL MAMMARY ARTERY AND RIGHT LEG GREATER SAPHENOUS VEIN HARVESTED ENDOSCOPICALLY AND RIGHT CORONARY THROMBECTOMY (N/A) TRANSESOPHAGEAL ECHOCARDIOGRAM (TEE) (N/A) APPLICATION OF CELL SAVER ENDOVEIN HARVEST OF GREATER SAPHENOUS VEIN (Right)  Total Length of Stay:  LOS: 4 days   Subjective: Sedated with propofol and mechanically ventilated.    Milrinone 0.61mg/kg/min Epi  Now off Levo  137m/min IABP   1:1  CI  2.2 CVP   10-1381m CoOx   67  Objective: Vital signs in last 24 hours: Temp:  [98.6 F (37 C)-99.3 F (37.4 C)] 99 F (37.2 C) (02/10 0700) Pulse Rate:  [88-102] 92 (02/10 0700) Cardiac Rhythm: Normal sinus rhythm (02/10 0700) Resp:  [15-24] 22 (02/10 0700) BP: (87-123)/(37-86) 109/67 (02/10 0700) SpO2:  [78 %-100 %] 100 % (02/10 0700) FiO2 (%):  [40 %] 40 % (02/10 0700) Weight:  [124.9 kg] 124.9 kg (02/10 0500)  Filed Weights   10/28/21 0645 10/29/21 0500 10/30/21 0500  Weight: 123.4 kg 122.1 kg 124.9 kg    Weight change: 2.8 kg   Hemodynamic parameters for last 24 hours: PAP: (28-41)/(13-24) 28/14 CVP:  [10 mmHg-17 mmHg] 13 mmHg CO:  [4.3 L/min-4.8 L/min] 4.3 L/min CI:  [2.2 L/min/m2-2.4 L/min/m2] 2.2 L/min/m2  Intake/Output from previous day: 02/09 0701 - 02/10 0700 In: 2787.8 [I.V.:2092.9; Blood:315; NG/GT:80; IV Piggyback:300] Out: [id number] [QMGQQ:7619mesis/NG output:80; Drains:54; Chest Tube:145]  Intake/Output this shift: No intake/output data recorded.  Current Meds: Scheduled Meds:  acetaminophen  1,000 mg Oral Q6H   Or   acetaminophen (TYLENOL) oral liquid 160 mg/5 mL  1,000 mg Per Tube Q6H   aspirin EC  325 mg Oral Daily   Or   aspirin  324 mg Per Tube Daily   chlorhexidine gluconate (MEDLINE KIT)  15 mL  Mouth Rinse BID   Chlorhexidine Gluconate Cloth  6 each Topical Daily   docusate  200 mg Per Tube Daily   enoxaparin (LOVENOX) injection  40 mg Subcutaneous Q24H   furosemide  40 mg Intravenous BID   insulin aspart  0-24 Units Subcutaneous Q4H   insulin detemir  12 Units Subcutaneous BID   mouth rinse  15 mL Mouth Rinse 10 times per day   metoCLOPramide (REGLAN) injection  10 mg Intravenous Q6H   metolazone  10 mg Per Tube Daily   pantoprazole sodium  40 mg Per Tube Daily   sennosides  5 mL Per Tube Daily   sodium chloride flush  3 mL Intravenous Q12H   Continuous Infusions:  sodium chloride Stopped (10/28/21 1408)   sodium chloride 20 mL/hr at 10/27/21 1621   sodium chloride 10 mL/hr at 10/29/21 2200   amiodarone 30 mg/hr (10/30/21 0700)   epinephrine Stopped (10/30/21 0625)   lactated ringers     milrinone 0.3 mcg/kg/min (10/30/21 0700)   norepinephrine (LEVOPHED) Adult infusion 3 mcg/min (10/30/21 0700)   potassium chloride 10 mEq (10/30/21 0701)   propofol (DIPRIVAN) infusion 30 mcg/kg/min (10/30/21 0700)   PRN Meds:.sodium chloride, metoprolol tartrate, midazolam, morphine injection, ondansetron (ZOFRAN) IV, oxyCODONE, sodium chloride flush, traMADol  General appearance: Sedated, on vent  Neurologic: sedated but awakens to  voice  Heart: Had a few episodes of tachycardia to 140's yesterday afternoon but stable SR over night.   Lungs: breath sounds are clear with good air movement bilat. No CXR yet. The CT drainage is tapering off.  Abdomen: firm, no apparent tenderness. Active bowel sounds. (Passing gas per RN) Extremities: Warm with good distal perfusion throughout. IABP in right femoral artery.  Doppler DP pulses bilat. Wound: the sternotomy incision is covered with a dry Aquacel dressing and breast binder. The rright EVH incisions are covered with dry dressings. The RLE JP drain had ~28m sero-sanguinous fluid past 24 hours.   Lab Results: CBC: Recent Labs     10/29/21 2030 10/30/21 0301 10/30/21 0319  WBC 11.4* 12.1*  --   HGB 8.6* 8.7* 8.5*  HCT 25.1* 25.7* 25.0*  PLT 124* 122*  --     BMET:  Recent Labs    10/29/21 1345 10/29/21 1546 10/30/21 0301 10/30/21 0319  NA 140   < > 139 138  K 3.8   < > 3.8 3.7  CL 106  --  105  --   CO2 25  --  27  --   GLUCOSE 164*  --  106*  --   BUN 29*  --  23  --   CREATININE 0.81  --  0.69  --   CALCIUM 7.7*  --  7.7*  --    < > = values in this interval not displayed.     CMET: Lab Results  Component Value Date   WBC 12.1 (H) 10/30/2021   HGB 8.5 (L) 10/30/2021   HCT 25.0 (L) 10/30/2021   PLT 122 (L) 10/30/2021   GLUCOSE 106 (H) 10/30/2021   CHOL 154 10/26/2021   TRIG 137 10/29/2021   HDL 55 10/26/2021   LDLCALC 70 10/26/2021   ALT 26 10/30/2021   AST 50 (H) 10/30/2021   NA 138 10/30/2021   K 3.7 10/30/2021   CL 105 10/30/2021   CREATININE 0.69 10/30/2021   BUN 23 10/30/2021   CO2 27 10/30/2021   TSH 7.256 (H) 10/30/2021   INR 1.1 10/29/2021   HGBA1C 5.3 10/26/2021      PT/INR:  Recent Labs    10/29/21 0500  LABPROT 14.7  INR 1.1    Radiology: DG CHEST PORT 1 VIEW  Result Date: 10/29/2021 CLINICAL DATA:  Follow-up support devices EXAM: PORTABLE CHEST 1 VIEW COMPARISON:  Chest x-ray dated October 29, 2021 FINDINGS: Unchanged position of ET tube, enteric tube and left chest and mediastinal drains. Intra-aortic balloon pump marker is unchanged in position, approximately at the level of the carina. Cardiac and mediastinal contours are enlarged and unchanged. Lower lung predominant opacities, likely due to atelectasis. No large pleural effusion or pneumothorax. IMPRESSION: Stable support devices. Electronically Signed   By: LYetta GlassmanM.D.   On: 10/29/2021 13:42   DG Abd Portable 1V  Result Date: 10/29/2021 CLINICAL DATA:  Feeding tube placement EXAM: PORTABLE ABDOMEN - 1 VIEW COMPARISON:  10/19/2020 FINDINGS: Feeding tube placement tip of NG tube is seen in the fundus  of the stomach. There is another larger caliber catheter extending from right lower abdomen with its tip in the medial aspect of fundus of the stomach. There are pain control leads in the lumbar spine. Bowel gas pattern is nonspecific. There is moderate distention of small-bowel loops and colon. There is prominence of interstitial markings in the lower lung fields. IMPRESSION: Tip of nasogastric tube is seen in the lateral aspect  of fundus of the stomach. There is a feeding tube extending from the right lower quadrant into the region of the stomach. There is moderate gaseous distention of colon and small bowel loops. Increased interstitial markings are seen in the lower lung fields suggesting scarring or interstitial pneumonitis, more so on the left side. Electronically Signed   By: Elmer Picker M.D.   On: 10/29/2021 15:32     Assessment/Plan: S/P Procedure(s) (LRB): CORONARY ARTERY BYPASS GRAFTING (CABG) TIMES FOUR, USING LEFT INTERNAL MAMMARY ARTERY AND RIGHT LEG GREATER SAPHENOUS VEIN HARVESTED ENDOSCOPICALLY AND RIGHT CORONARY THROMBECTOMY (N/A) TRANSESOPHAGEAL ECHOCARDIOGRAM (TEE) (N/A) APPLICATION OF CELL SAVER ENDOVEIN HARVEST OF GREATER SAPHENOUS VEIN (Right)  -POD3 Emergency CABG x 4 after presenting with acute STEMI. Currently supported with IABP at 1:1 and  milrinone, Levo. Epi now off.  CoOx improved to 67.  Hope to wean and  remove the IABP today.   Continue inotropic support and continue to wean the NE as tolerated.     -Peri-op VF and post-op A-fib- Now in stable SR. On amiodarone IV at 49m/hr.   K+ 3.7, Mg++ 2.2.  Replacing K+  -PULM- oxygenation adequate on FiO2 0.4.  Has significant volume excess with Wt 15kg+. Repeat BID lasix 46mIV and metolazone today.  CXR pending.    -HEME- expected acute blood loss anemia. Post-op coagulopathy corrected. CT and RLE drainage is tapering off. Remove the JP drain today.Transfused 2 units PRBC's POD1 and another unit PRBC's last evening  with appropriate response.  Monitoring.   -GI / nutrition- has bowel sounds now and is passing gas. Consider CorTrak and TF if unable to make progress toward extubation today.  -RENAL- [facility]  Now 0.69, UO adequate. Diurese as hemodynamics will tolerate.  -NEURO-sedated but responsive and moves extremities.   MyAntony OdeaPA-C [id number]/06/2022 7:52 AM  Drips weaned to low dose with maintemance of good hemodynamics- IABP now 1:2 Will attempt vent wean today, wean off propafol Cont diuresis patient examined and medical record reviewed,agree with above note. PeDahlia Byes/06/2022

## 2021-10-30 NOTE — Procedures (Signed)
Extubation Procedure Note  Patient Details:   Name: Deborah Cooke DOB: Jan 29, 1951 MRN: [id number]   Airway Documentation:    Vent end date: 10/30/21 Vent end time: 8786   Evaluation  O2 sats: stable throughout Complications: No apparent complications Patient did tolerate procedure well. Bilateral Breath Sounds: Clear, Diminished   Yes  Patient extubated to 4L [place]. Patient had a positive cuff leak prior to extubation. No stridor noted at this time.   [name] 10/30/2021, 12:41 PM

## 2021-10-30 NOTE — Progress Notes (Signed)
CT Surgery  I spoke with cath lab this pm and they will check in tomorrow am to pull IABP if she successfully weans as ordered An ACT needs to be done by 7 am .  P Prescott Gum MD

## 2021-10-31 ENCOUNTER — Inpatient Hospital Stay (HOSPITAL_COMMUNITY): Payer: Medicare Other

## 2021-10-31 LAB — BASIC METABOLIC PANEL
Anion gap: 8 (ref 5–15)
BUN: 23 mg/dL (ref 8–23)
CO2: 27 mmol/L (ref 22–32)
Calcium: 8 mg/dL — ABNORMAL LOW (ref 8.9–10.3)
Chloride: 104 mmol/L (ref 98–111)
Creatinine, Ser: 0.65 mg/dL (ref 0.44–1.00)
GFR, Estimated: >60 mL/min (ref >60)
Glucose, Bld: 119 mg/dL — ABNORMAL HIGH (ref 70–99)
Potassium: 4 mmol/L (ref 3.5–5.1)
Sodium: 139 mmol/L (ref 135–145)

## 2021-10-31 LAB — POCT I-STAT 7, (LYTES, BLD GAS, ICA,H+H)
Acid-Base Excess: 3 mmol/L — ABNORMAL HIGH (ref 0.0–2.0)
Bicarbonate: 28.6 mmol/L — ABNORMAL HIGH (ref 20.0–28.0)
Calcium, Ion: 1.21 mmol/L (ref 1.15–1.40)
HCT: 31 % — ABNORMAL LOW (ref 36.0–46.0)
Hemoglobin: 10.5 g/dL — ABNORMAL LOW (ref 12.0–15.0)
O2 Saturation: 97 %
Patient temperature: 37.1
Potassium: 3.8 mmol/L (ref 3.5–5.1)
Sodium: 138 mmol/L (ref 135–145)
TCO2: 30 mmol/L (ref 22–32)
pCO2 arterial: 46.2 mmHg (ref 32.0–48.0)
pH, Arterial: 7.4 (ref 7.350–7.450)
pO2, Arterial: 97 mmHg (ref 83.0–108.0)

## 2021-10-31 LAB — COOXEMETRY PANEL
Carboxyhemoglobin: 1.2 % (ref 0.5–1.5)
Methemoglobin: 1.2 % (ref 0.0–1.5)
O2 Saturation: 66.9 %
Total hemoglobin: 10.1 g/dL — ABNORMAL LOW (ref 12.0–16.0)

## 2021-10-31 LAB — CBC
HCT: 29.9 % — ABNORMAL LOW (ref 36.0–46.0)
Hemoglobin: 9.6 g/dL — ABNORMAL LOW (ref 12.0–15.0)
MCH: 30 pg (ref 26.0–34.0)
MCHC: 32.1 g/dL (ref 30.0–36.0)
MCV: 93.4 fL (ref 80.0–100.0)
Platelets: 126 10*3/uL — ABNORMAL LOW (ref 150–400)
RBC: 3.2 MIL/uL — ABNORMAL LOW (ref 3.87–5.11)
RDW: 16.4 % — ABNORMAL HIGH (ref 11.5–15.5)
WBC: 13.1 10*3/uL — ABNORMAL HIGH (ref 4.0–10.5)
nRBC: 5.4 % — ABNORMAL HIGH (ref 0.0–0.2)

## 2021-10-31 LAB — MAGNESIUM
Magnesium: 2 mg/dL (ref 1.7–2.4)
Magnesium: 2 mg/dL (ref 1.7–2.4)

## 2021-10-31 LAB — T3, FREE: T3, Free: 1.8 pg/mL — ABNORMAL LOW (ref 2.0–4.4)

## 2021-10-31 LAB — POCT ACTIVATED CLOTTING TIME: Activated Clotting Time: 131 seconds

## 2021-10-31 LAB — GLUCOSE, CAPILLARY
Glucose-Capillary: 112 mg/dL — ABNORMAL HIGH (ref 70–99)
Glucose-Capillary: 148 mg/dL — ABNORMAL HIGH (ref 70–99)
Glucose-Capillary: 147 mg/dL — ABNORMAL HIGH (ref 70–99)
Glucose-Capillary: 112 mg/dL — ABNORMAL HIGH (ref 70–99)
Glucose-Capillary: 117 mg/dL — ABNORMAL HIGH (ref 70–99)

## 2021-10-31 LAB — PHOSPHORUS
Phosphorus: 3.1 mg/dL (ref 2.5–4.6)
Phosphorus: 3.4 mg/dL (ref 2.5–4.6)

## 2021-10-31 LAB — POTASSIUM: Potassium: 3.5 mmol/L (ref 3.5–5.1)

## 2021-10-31 MED ORDER — CLOPIDOGREL BISULFATE 75 MG PO TABS
75.0000 mg | ORAL_TABLET | Freq: Every day | ORAL | Status: DC
  Administered 2021-10-31 – 2021-11-02 (×3): 75 mg via NASOGASTRIC
  Filled 2021-10-31 (×3): qty 1

## 2021-10-31 MED ORDER — METHYLNALTREXONE BROMIDE 12 MG/0.6ML ~~LOC~~ SOLN
12.0000 mg | Freq: Once | SUBCUTANEOUS | Status: AC
  Administered 2021-10-31: 12 mg via SUBCUTANEOUS
  Filled 2021-10-31: qty 0.6

## 2021-10-31 MED ORDER — METOCLOPRAMIDE HCL 5 MG/ML IJ SOLN
10.0000 mg | Freq: Four times a day (QID) | INTRAMUSCULAR | Status: DC
  Administered 2021-10-31 – 2021-11-02 (×7): 10 mg via INTRAVENOUS
  Filled 2021-10-31 (×7): qty 2

## 2021-10-31 MED ORDER — POTASSIUM CHLORIDE 10 MEQ/50ML IV SOLN
10.0000 meq | INTRAVENOUS | Status: AC
  Administered 2021-10-31 (×3): 10 meq via INTRAVENOUS
  Filled 2021-10-31 (×3): qty 50

## 2021-10-31 MED ORDER — FENTANYL CITRATE PF 50 MCG/ML IJ SOSY
PREFILLED_SYRINGE | INTRAMUSCULAR | Status: AC
  Filled 2021-10-31: qty 1

## 2021-10-31 MED ORDER — FENTANYL CITRATE PF 50 MCG/ML IJ SOSY
50.0000 ug | PREFILLED_SYRINGE | Freq: Once | INTRAMUSCULAR | Status: AC
  Administered 2021-10-31: 50 ug via INTRAVENOUS

## 2021-10-31 NOTE — Progress Notes (Signed)
Spoke with Cyndia Bent, MD, ok to discontinue IABP. Patient hemodynamically stable.

## 2021-10-31 NOTE — Progress Notes (Signed)
4 Days Post-Op Procedure(s) (LRB): CORONARY ARTERY BYPASS GRAFTING (CABG) TIMES FOUR, USING LEFT INTERNAL MAMMARY ARTERY AND RIGHT LEG GREATER SAPHENOUS VEIN HARVESTED ENDOSCOPICALLY AND RIGHT CORONARY THROMBECTOMY (N/A) TRANSESOPHAGEAL ECHOCARDIOGRAM (TEE) (N/A) APPLICATION OF CELL SAVER ENDOVEIN HARVEST OF GREATER SAPHENOUS VEIN (Right) Subjective:  Just keeps saying "somebody help me".   Objective: Vital signs in last 24 hours: Temp:  [97.7 F (36.5 C)-100 F (37.8 C)] 99.1 F (37.3 C) (02/11 1400) Pulse Rate:  [88-99] 99 (02/11 0600) Cardiac Rhythm: Sinus tachycardia (02/11 0800) Resp:  [12-22] 18 (02/11 1400) BP: (83-119)/(51-98) 94/53 (02/11 1200) SpO2:  [85 %-99 %] 85 % (02/11 1400) Weight:  [123.4 kg] 123.4 kg (02/11 0500)  Hemodynamic parameters for last 24 hours: PAP: (23-38)/(9-24) 29/11 CVP:  [6 mmHg-17 mmHg] 9 mmHg CO:  [4.5 L/min-5.4 L/min] 4.6 L/min CI:  [2.2 L/min/m2-2.7 L/min/m2] 2.3 L/min/m2  Intake/Output from previous day: 02/10 0701 - 02/11 0700 In: 1605.1 [I.V.:795.6; NG/GT:509.5; IV Piggyback:300] Out: 3071 [Urine:2970; Drains:31; Chest Tube:70] Intake/Output this shift: Total I/O In: 500.9 [I.V.:140.9; NG/GT:360] Out: 1170 [Urine:1150; Drains:20]  General appearance: lying in bed, looks comfortable Neurologic: non-focal but has delirium Heart: regular rate and rhythm, S1, S2 normal, no murmur Lungs: clear to auscultation bilaterally Abdomen: no bowel sounds, distended, firm, nontender Extremities: anasarca Wound: incision ok  Lab Results: Recent Labs    10/30/21 0301 10/30/21 0319 10/31/21 0306 10/31/21 0322  WBC 12.1*  --  13.1*  --   HGB 8.7*   < > 9.6* 10.5*  HCT 25.7*   < > 29.9* 31.0*  PLT 122*  --  126*  --    < > = values in this interval not displayed.   BMET:  Recent Labs    10/30/21 2100 10/31/21 0306 10/31/21 0322  NA 139 139 138  K 4.0 4.0 3.8  CL 104 104  --   CO2 27 27  --   GLUCOSE 107* 119*  --   BUN 21 23   --   CREATININE 0.66 0.65  --   CALCIUM 7.9* 8.0*  --     PT/INR:  Recent Labs    10/30/21 1503  LABPROT 12.6  INR 1.0   ABG    Component Value Date/Time   PHART 7.400 10/31/2021 0322   HCO3 28.6 (H) 10/31/2021 0322   TCO2 30 10/31/2021 0322   ACIDBASEDEF 1.0 10/28/2021 0036   O2SAT 97.0 10/31/2021 0322   CBG (last 3)  Recent Labs    10/31/21 0318 10/31/21 0742 10/31/21 1155  GLUCAP 112* 148* 147*   KUB: ileus  CXR: patchy bilateral densities that may be atelectasis or infiltrate.  Assessment/Plan: S/P Procedure(s) (LRB): CORONARY ARTERY BYPASS GRAFTING (CABG) TIMES FOUR, USING LEFT INTERNAL MAMMARY ARTERY AND RIGHT LEG GREATER SAPHENOUS VEIN HARVESTED ENDOSCOPICALLY AND RIGHT CORONARY THROMBECTOMY (N/A) TRANSESOPHAGEAL ECHOCARDIOGRAM (TEE) (N/A) APPLICATION OF CELL SAVER ENDOVEIN HARVEST OF GREATER SAPHENOUS VEIN (Right)  POD 4  Hemodynamically stable on milrinone 0.3 and NE 14. IABP weaned this am and just removed by cath lab. Rhythm is sinus with brief bursts of AF on amio.  Oxygenation stable.  Volume excess: -1500 cc yesterday. Wt down 3 lbs but still 30 lbs over preop wt. Continue diuretic.  Abdomen distended with no BS and ileus on KUB. Will stop TF for now. Increase Reglan to Q6. Add Relistor.     LOS: 5 days    Gaye Pollack 10/31/2021

## 2021-10-31 NOTE — Progress Notes (Signed)
Site Area: Right groin IABP removal  Site Prior to Removal: Level 0 Pressure Applied For:  30 Mins Manual Pressure Pt Status During Pull:  Stable Post Pull Site: Level 0 Post Pull Instructions Given:  Yes Dressing Applied: Gauze and Tegaderm Bedrest Begins @ 15:20 x 6hrs

## 2021-11-01 ENCOUNTER — Inpatient Hospital Stay (HOSPITAL_COMMUNITY): Payer: Medicare Other

## 2021-11-01 LAB — TRIGLYCERIDES: Triglycerides: 67 mg/dL (ref <150)

## 2021-11-01 LAB — POCT I-STAT 7, (LYTES, BLD GAS, ICA,H+H)
Acid-Base Excess: 4 mmol/L — ABNORMAL HIGH (ref 0.0–2.0)
Bicarbonate: 29.7 mmol/L — ABNORMAL HIGH (ref 20.0–28.0)
Calcium, Ion: 1.16 mmol/L (ref 1.15–1.40)
HCT: 29 % — ABNORMAL LOW (ref 36.0–46.0)
Hemoglobin: 9.9 g/dL — ABNORMAL LOW (ref 12.0–15.0)
O2 Saturation: 97 %
Patient temperature: 37.4
Potassium: 3.5 mmol/L (ref 3.5–5.1)
Sodium: 139 mmol/L (ref 135–145)
TCO2: 31 mmol/L (ref 22–32)
pCO2 arterial: 48 mmHg (ref 32.0–48.0)
pH, Arterial: 7.402 (ref 7.350–7.450)
pO2, Arterial: 91 mmHg (ref 83.0–108.0)

## 2021-11-01 LAB — GLUCOSE, CAPILLARY
Glucose-Capillary: 121 mg/dL — ABNORMAL HIGH (ref 70–99)
Glucose-Capillary: 122 mg/dL — ABNORMAL HIGH (ref 70–99)
Glucose-Capillary: 119 mg/dL — ABNORMAL HIGH (ref 70–99)
Glucose-Capillary: 101 mg/dL — ABNORMAL HIGH (ref 70–99)

## 2021-11-01 LAB — BASIC METABOLIC PANEL
Anion gap: 9 (ref 5–15)
BUN: 19 mg/dL (ref 8–23)
CO2: 26 mmol/L (ref 22–32)
Calcium: 7 mg/dL — ABNORMAL LOW (ref 8.9–10.3)
Chloride: 102 mmol/L (ref 98–111)
Creatinine, Ser: 0.65 mg/dL (ref 0.44–1.00)
GFR, Estimated: >60 mL/min (ref >60)
Glucose, Bld: 296 mg/dL — ABNORMAL HIGH (ref 70–99)
Potassium: 3 mmol/L — ABNORMAL LOW (ref 3.5–5.1)
Sodium: 137 mmol/L (ref 135–145)

## 2021-11-01 LAB — CBC
HCT: 26.8 % — ABNORMAL LOW (ref 36.0–46.0)
Hemoglobin: 8.6 g/dL — ABNORMAL LOW (ref 12.0–15.0)
MCH: 30.7 pg (ref 26.0–34.0)
MCHC: 32.1 g/dL (ref 30.0–36.0)
MCV: 95.7 fL (ref 80.0–100.0)
Platelets: 124 10*3/uL — ABNORMAL LOW (ref 150–400)
RBC: 2.8 MIL/uL — ABNORMAL LOW (ref 3.87–5.11)
RDW: 16.3 % — ABNORMAL HIGH (ref 11.5–15.5)
WBC: 12.4 10*3/uL — ABNORMAL HIGH (ref 4.0–10.5)
nRBC: 3.3 % — ABNORMAL HIGH (ref 0.0–0.2)

## 2021-11-01 LAB — COOXEMETRY PANEL
Carboxyhemoglobin: 1.2 % (ref 0.5–1.5)
Methemoglobin: 1.3 % (ref 0.0–1.5)
O2 Saturation: 66.3 %
Total hemoglobin: 9.1 g/dL — ABNORMAL LOW (ref 12.0–16.0)

## 2021-11-01 LAB — PHOSPHORUS: Phosphorus: 2.8 mg/dL (ref 2.5–4.6)

## 2021-11-01 LAB — MAGNESIUM: Magnesium: 1.7 mg/dL (ref 1.7–2.4)

## 2021-11-01 MED ORDER — VANCOMYCIN HCL 1250 MG/250ML IV SOLN
1250.0000 mg | INTRAVENOUS | Status: DC
  Administered 2021-11-01 – 2021-11-04 (×4): 1250 mg via INTRAVENOUS
  Filled 2021-11-01 (×4): qty 250

## 2021-11-01 MED ORDER — NEOSTIGMINE METHYLSULFATE 10 MG/10ML IV SOLN
0.2500 mg | Freq: Four times a day (QID) | INTRAVENOUS | Status: DC
  Administered 2021-11-01 – 2021-11-02 (×3): 0.25 mg via SUBCUTANEOUS
  Filled 2021-11-01 (×7): qty 0.25

## 2021-11-01 MED ORDER — PIPERACILLIN-TAZOBACTAM 3.375 G IVPB
3.3750 g | Freq: Three times a day (TID) | INTRAVENOUS | Status: DC
  Administered 2021-11-01 – 2021-11-05 (×12): 3.375 g via INTRAVENOUS
  Filled 2021-11-01 (×12): qty 50

## 2021-11-01 MED ORDER — POTASSIUM CHLORIDE 10 MEQ/50ML IV SOLN
10.0000 meq | INTRAVENOUS | Status: AC
  Administered 2021-11-01 (×3): 10 meq via INTRAVENOUS
  Filled 2021-11-01: qty 50

## 2021-11-01 MED ORDER — POTASSIUM CHLORIDE 10 MEQ/50ML IV SOLN
10.0000 meq | INTRAVENOUS | Status: AC
  Administered 2021-11-01 (×3): 10 meq via INTRAVENOUS
  Filled 2021-11-01 (×2): qty 50

## 2021-11-01 NOTE — Progress Notes (Signed)
Pharmacy Antibiotic Note  Deborah Cooke is a 72 y.o. female admitted on 10/26/2021 s/p CABG. Pt with worsening CXR and possible PNA. Pharmacy has been consulted for vancomycin and Zosyn dosing.  Plan: Zosyn 3.375g IV EI q8h Vancomycin 1250mg  IV q24h - est AUC 518  Height: 4\' 11"  (149.9 cm) Weight: 120.8 kg (266 lb 5.1 oz) IBW/kg (Calculated) : 43.2  Temp (24hrs), Avg:99.2 F (37.3 C), Min:97.9 F (36.6 C), Max:99.5 F (37.5 C)  Recent Labs  Lab 10/29/21 0500 10/29/21 1345 10/29/21 2030 10/30/21 0301 10/30/21 1503 10/30/21 2100 10/31/21 0306 11/01/21 0357  WBC 13.1*  --  11.4* 12.1*  --   --  13.1* 12.4*  CREATININE 0.88   < >  --  0.69 0.68 0.66 0.65 0.65   < > = values in this interval not displayed.    Estimated Creatinine Clearance: 73.4 mL/min (by C-G formula based on SCr of 0.65 mg/dL).    Allergies  Allergen Reactions   Nsaids Nausea And Vomiting and Other (See Comments)    GI Upset (ibuprofen included)   Ibuprofen Other (See Comments)    Other reaction(s): Abdominal Pain   Tolmetin Other (See Comments)    Other reaction(s): Abdominal Pain     Antimicrobials this admission: Zosyn 2/12 >>  Vancomycin 2/12 >>   Dose adjustments this admission: none  Microbiology results: none  Thank you for allowing pharmacy to be a part of this patients care.  Arrie Senate, PharmD, BCPS, [facility] Clinical Pharmacist [phone] Please check AMION for all [place] numbers 11/01/2021

## 2021-11-01 NOTE — Progress Notes (Signed)
5 Days Post-Op Procedure(s) (LRB): CORONARY ARTERY BYPASS GRAFTING (CABG) TIMES FOUR, USING LEFT INTERNAL MAMMARY ARTERY AND RIGHT LEG GREATER SAPHENOUS VEIN HARVESTED ENDOSCOPICALLY AND RIGHT CORONARY THROMBECTOMY (N/A) TRANSESOPHAGEAL ECHOCARDIOGRAM (TEE) (N/A) APPLICATION OF CELL SAVER ENDOVEIN HARVEST OF GREATER SAPHENOUS VEIN (Right) Subjective: No specific complaints.  Remains on milrinone 0.3 and NE 6. CI 2.7, Co-ox 66.3% Objective: Vital signs in last 24 hours: Temp:  [97.9 F (36.6 C)-99.5 F (37.5 C)] 99.3 F (37.4 C) (02/12 1400) Pulse Rate:  [104] 104 (02/11 1500) Cardiac Rhythm: Sinus tachycardia (02/12 1200) Resp:  [13-29] 29 (02/12 1400) BP: (89-112)/(56-59) 95/57 (02/12 0000) SpO2:  [87 %-98 %] 95 % (02/12 1400) Weight:  [120.8 kg] 120.8 kg (02/12 0437)  Hemodynamic parameters for last 24 hours: PAP: (21-31)/(8-18) 27/9 CVP:  [8 mmHg-16 mmHg] 12 mmHg PCWP:  [13 mmHg] 13 mmHg CO:  [4.1 L/min-5.4 L/min] 5.4 L/min CI:  [2.1 L/min/m2-2.7 L/min/m2] 2.7 L/min/m2  Intake/Output from previous day: 02/11 0701 - 02/12 0700 In: 1792.2 [I.V.:802.1; NG/GT:840; IV Piggyback:150.1] Out: 2700 [Urine:2650; Drains:50] Intake/Output this shift: Total I/O In: 533.6 [I.V.:323.6; NG/GT:60; IV Piggyback:150] Out: 797.5 [Urine:775; Drains:22.5]  General appearance: alert and some delirium Neurologic: non-focal Heart: regular rate and rhythm Lungs: clear to auscultation bilaterally Abdomen: distended, firm hypoactive bowel sounds  Extremities: edema moderate Wound: Aquacel dressing intact  Lab Results: Recent Labs    10/31/21 0306 10/31/21 0322 11/01/21 0337 11/01/21 0357  WBC 13.1*  --   --  12.4*  HGB 9.6*   < > 9.9* 8.6*  HCT 29.9*   < > 29.0* 26.8*  PLT 126*  --   --  124*   < > = values in this interval not displayed.   BMET:  Recent Labs    10/31/21 0306 10/31/21 0322 11/01/21 0337 11/01/21 0357  NA 139   < > 139 137  K 4.0   < > 3.5 3.0*  CL 104  --    --  102  CO2 27  --   --  26  GLUCOSE 119*  --   --  296*  BUN 23  --   --  19  CREATININE 0.65  --   --  0.65  CALCIUM 8.0*  --   --  7.0*   < > = values in this interval not displayed.    PT/INR:  Recent Labs    10/30/21 1503  LABPROT 12.6  INR 1.0   ABG    Component Value Date/Time   PHART 7.402 11/01/2021 0337   HCO3 29.7 (H) 11/01/2021 0337   TCO2 31 11/01/2021 0337   ACIDBASEDEF 1.0 10/28/2021 0036   O2SAT 66.3 11/01/2021 0357   CBG (last 3)  Recent Labs    11/01/21 0334 11/01/21 0756 11/01/21 1144  GLUCAP 121* 122* 119*   KUB: persistent ileus of small bowel and colon.  CXR: Improved aeration right mid lung. Persistent interstitial prominence and air spaced disease in both lungs suggesting possible pneumonia.  Assessment/Plan: S/P Procedure(s) (LRB): CORONARY ARTERY BYPASS GRAFTING (CABG) TIMES FOUR, USING LEFT INTERNAL MAMMARY ARTERY AND RIGHT LEG GREATER SAPHENOUS VEIN HARVESTED ENDOSCOPICALLY AND RIGHT CORONARY THROMBECTOMY (N/A) TRANSESOPHAGEAL ECHOCARDIOGRAM (TEE) (N/A) APPLICATION OF CELL SAVER ENDOVEIN HARVEST OF GREATER SAPHENOUS VEIN (Right)  POD 5 Hemodynamically stable with good CI and Co-ox. Will decrease milrinone to 0.2. Titrate NE for BP. Can remove swan.  Atrial fib converted with amio. She remains on IV amio with mostly sinus but some brief bursts of what looks like  AF.  Ileus: given Relistor yesterday without much change. Will try neostigmine. Keep NPO and hold off on tube feeds until this resolves.  Possible pneumonia on CXR, mild leukocytosis, low grade temps 99+. Will her history, morbid obesity  will start her on vanc and Zosyn empirically for possible aspiration.   Volume excess: continue diuresis, replace K+   LOS: 6 days    Gaye Pollack 11/01/2021

## 2021-11-02 ENCOUNTER — Inpatient Hospital Stay (HOSPITAL_COMMUNITY): Payer: Medicare Other

## 2021-11-02 ENCOUNTER — Inpatient Hospital Stay: Payer: Self-pay

## 2021-11-02 LAB — MAGNESIUM: Magnesium: 1.8 mg/dL (ref 1.7–2.4)

## 2021-11-02 LAB — CBC
HCT: 28.7 % — ABNORMAL LOW (ref 36.0–46.0)
Hemoglobin: 9.6 g/dL — ABNORMAL LOW (ref 12.0–15.0)
MCH: 31.5 pg (ref 26.0–34.0)
MCHC: 33.4 g/dL (ref 30.0–36.0)
MCV: 94.1 fL (ref 80.0–100.0)
Platelets: 208 10*3/uL (ref 150–400)
RBC: 3.05 MIL/uL — ABNORMAL LOW (ref 3.87–5.11)
RDW: 17.1 % — ABNORMAL HIGH (ref 11.5–15.5)
WBC: 15.8 10*3/uL — ABNORMAL HIGH (ref 4.0–10.5)
nRBC: 1.1 % — ABNORMAL HIGH (ref 0.0–0.2)

## 2021-11-02 LAB — POCT I-STAT 7, (LYTES, BLD GAS, ICA,H+H)
Acid-Base Excess: 7 mmol/L — ABNORMAL HIGH (ref 0.0–2.0)
Bicarbonate: 31.1 mmol/L — ABNORMAL HIGH (ref 20.0–28.0)
Calcium, Ion: 1.17 mmol/L (ref 1.15–1.40)
HCT: 28 % — ABNORMAL LOW (ref 36.0–46.0)
Hemoglobin: 9.5 g/dL — ABNORMAL LOW (ref 12.0–15.0)
O2 Saturation: 97 %
Potassium: 3.3 mmol/L — ABNORMAL LOW (ref 3.5–5.1)
Sodium: 138 mmol/L (ref 135–145)
TCO2: 32 mmol/L (ref 22–32)
pCO2 arterial: 43.1 mmHg (ref 32.0–48.0)
pH, Arterial: 7.467 — ABNORMAL HIGH (ref 7.350–7.450)
pO2, Arterial: 83 mmHg (ref 83.0–108.0)

## 2021-11-02 LAB — BASIC METABOLIC PANEL
Anion gap: 12 (ref 5–15)
BUN: 22 mg/dL (ref 8–23)
CO2: 28 mmol/L (ref 22–32)
Calcium: 8.2 mg/dL — ABNORMAL LOW (ref 8.9–10.3)
Chloride: 99 mmol/L (ref 98–111)
Creatinine, Ser: 0.8 mg/dL (ref 0.44–1.00)
GFR, Estimated: >60 mL/min (ref >60)
Glucose, Bld: 128 mg/dL — ABNORMAL HIGH (ref 70–99)
Potassium: 3.3 mmol/L — ABNORMAL LOW (ref 3.5–5.1)
Sodium: 139 mmol/L (ref 135–145)

## 2021-11-02 LAB — GLUCOSE, CAPILLARY
Glucose-Capillary: 101 mg/dL — ABNORMAL HIGH (ref 70–99)
Glucose-Capillary: 108 mg/dL — ABNORMAL HIGH (ref 70–99)
Glucose-Capillary: 110 mg/dL — ABNORMAL HIGH (ref 70–99)
Glucose-Capillary: 138 mg/dL — ABNORMAL HIGH (ref 70–99)
Glucose-Capillary: 88 mg/dL (ref 70–99)

## 2021-11-02 LAB — COOXEMETRY PANEL
Carboxyhemoglobin: 1.4 % (ref 0.5–1.5)
Methemoglobin: 1.2 % (ref 0.0–1.5)
O2 Saturation: 65.2 %
Total hemoglobin: 8.7 g/dL — ABNORMAL LOW (ref 12.0–16.0)

## 2021-11-02 MED ORDER — SODIUM CHLORIDE 0.9% FLUSH
10.0000 mL | Freq: Two times a day (BID) | INTRAVENOUS | Status: DC
  Administered 2021-11-03 – 2021-11-04 (×3): 10 mL
  Administered 2021-11-04: 20 mL
  Administered 2021-11-05: 10 mL
  Administered 2021-11-05: 20 mL
  Administered 2021-11-06 – 2021-11-08 (×6): 10 mL
  Administered 2021-11-09: 20 mL
  Administered 2021-11-09 – 2021-11-10 (×3): 10 mL

## 2021-11-02 MED ORDER — SODIUM CHLORIDE 0.9% FLUSH: 10.0000 mL | INTRAVENOUS | Status: DC | PRN

## 2021-11-02 MED ORDER — METOCLOPRAMIDE HCL 5 MG/ML IJ SOLN
10.0000 mg | Freq: Three times a day (TID) | INTRAMUSCULAR | Status: DC
  Administered 2021-11-02 – 2021-11-03 (×3): 10 mg via INTRAVENOUS
  Filled 2021-11-02 (×3): qty 2

## 2021-11-02 MED ORDER — ASPIRIN EC 81 MG PO TBEC
81.0000 mg | DELAYED_RELEASE_TABLET | Freq: Every day | ORAL | Status: DC
  Administered 2021-11-02 – 2021-11-11 (×10): 81 mg via ORAL
  Filled 2021-11-02 (×10): qty 1

## 2021-11-02 MED ORDER — CHLORHEXIDINE GLUCONATE 0.12 % MT SOLN
OROMUCOSAL | Status: AC
  Filled 2021-11-02: qty 15

## 2021-11-02 MED ORDER — MILRINONE LACTATE IN DEXTROSE 20-5 MG/100ML-% IV SOLN
0.1250 ug/kg/min | INTRAVENOUS | Status: DC
  Administered 2021-11-02 (×2): 0.125 ug/kg/min via INTRAVENOUS
  Filled 2021-11-02: qty 100

## 2021-11-02 MED ORDER — SODIUM CHLORIDE 0.9% FLUSH
10.0000 mL | Freq: Two times a day (BID) | INTRAVENOUS | Status: DC
  Administered 2021-11-02 – 2021-11-06 (×6): 10 mL

## 2021-11-02 MED ORDER — POTASSIUM CHLORIDE 20 MEQ PO PACK
20.0000 meq | PACK | ORAL | Status: AC
  Administered 2021-11-02 (×3): 20 meq
  Filled 2021-11-02 (×3): qty 1

## 2021-11-02 NOTE — Evaluation (Signed)
Physical Therapy Evaluation Patient Details Name: Deborah Cooke MRN: [id number] DOB: 1951/03/16 Today's Date: 11/02/2021  History of Present Illness  71 yo female admittd 2/6 with STEMI and fall at home. Upon ICU admission pt with VT arrest and CPR with ROSC with IABP placed 2/6-2/11. PT s/p cABGx 4 2/7. PMHx: Rt TSA 10/20/21, breast CA, depression, anxiety, anemia, HTN, thyroid dz, Left femur ORIF  Clinical Impression  Pt pleasant and eager to start moving and eating. Pt reports she is normally independent and lives with spouse who is a Administrator and gone for 3 weeks at a time. Pt has required assistance for bathing and dressing since TSA and demonstrates decreased strength, function, ROM and mobility at this time who will benefit from acute therapy to maximize mobility safety and independence. With pt limited UE assist with shoudler and sternal precautions and limited family assist anticipate SNF will be warranted at D/C to regain independence.   HR 104 at rest with HR up to 140 with transfers with Dr.Van Trigt present during session Unable to obtain pleth on SPO2 or reading throughout session with pt on 3L 148/70 via a line     Recommendations for follow up therapy are one component of a multi-disciplinary discharge planning process, led by the attending physician.  Recommendations may be updated based on patient status, additional functional criteria and insurance authorization.  Follow Up Recommendations Skilled nursing-short term rehab (<3 hours/day)    Assistance Recommended at Discharge Frequent or constant Supervision/Assistance  Patient can return home with the following  A lot of help with walking and/or transfers;A lot of help with bathing/dressing/bathroom;Assistance with cooking/housework;Assist for transportation;Direct supervision/assist for medications management;Help with stairs or ramp for entrance    Equipment Recommendations BSC/3in1;Hospital bed  Recommendations for  Other Services  OT consult    Functional Status Assessment Patient has had a recent decline in their functional status and demonstrates the ability to make significant improvements in function in a reasonable and predictable amount of time.     Precautions / Restrictions Precautions Precautions: Sternal;Fall;Shoulder Type of Shoulder Precautions: no shoulder ROM, AROM elbow and wrist ok until cleared by MD, Jp drain right calf Shoulder Interventions: Shoulder sling/immobilizer;Off for dressing/bathing/exercises Precaution Comments: pt educated for sternal and shoulder precautions Required Braces or Orthoses: Sling Restrictions RUE Weight Bearing: Non weight bearing      Mobility  Bed Mobility Overal bed mobility: Needs Assistance Bed Mobility: Rolling, Sidelying to Sit Rolling: Mod assist, +2 for physical assistance Sidelying to sit: Mod assist, +2 for physical assistance Supine to sit: HOB elevated     General bed mobility comments: pt with assist to roll to left with mod +2 assist with pad and cues for precautions with RUE sling in place. side to sit with assist to elevate trunk and bring legs off of bed. Max +2 to scoot back in chair end of session    Transfers Overall transfer level: Needs assistance   Transfers: Sit to/from Stand Sit to Stand: Mod assist, +2 physical assistance           General transfer comment: Mod +2 assist to stand with assist of pad at sacrum to rise with bil knees blocked. Pt unable to successfully weight shift or side step in standing and returned to sitting. Pt then stood a 2nd trial for grossly 30 sec with bed moved from behind pt and chair moved under her. HR up to 140 with transfer and further activity deferred    Ambulation/Gait  General Gait Details: not yet ready  Stairs            Wheelchair Mobility    Modified Rankin (Stroke Patients Only)       Balance Overall balance assessment: Needs  assistance Sitting-balance support: Feet supported, No upper extremity supported Sitting balance-Leahy Scale: Fair     Standing balance support: Single extremity supported Standing balance-Leahy Scale: Poor Standing balance comment: knees blocked and physical assist to remain standing                             Pertinent Vitals/Pain Pain Assessment Pain Assessment: No/denies pain    Home Living Family/patient expects to be discharged to:: Private residence Living Arrangements: Spouse/significant other Available Help at Discharge: Family;Available PRN/intermittently (spouse drives a truck) Type of Home: House Home Access: Level entry       Home Layout: One level Home Equipment: [place] - single Barista (2 wheels)      Prior Function Prior Level of Function : Independent/Modified Independent             Mobility Comments: walks with RW or cane ADLs Comments: assist for bathing/dressing since TSA sx     Hand Dominance        Extremity/Trunk Assessment   Upper Extremity Assessment Upper Extremity Assessment: RUE deficits/detail RUE Deficits / Details: TSA 1/31 and await orders for ROM    Lower Extremity Assessment Lower Extremity Assessment: Generalized weakness (bil LE weakness with edema)       Communication   Communication: No difficulties  Cognition Arousal/Alertness: Awake/alert Behavior During Therapy: Flat affect Overall Cognitive Status: Impaired/Different from baseline Area of Impairment: Memory, Safety/judgement                     Memory: Decreased short-term memory   Safety/Judgement: Decreased awareness of safety, Decreased awareness of deficits     General Comments: pt with decreased memory, precautions and awareness of safety        General Comments      Exercises General Exercises - Lower Extremity Short Arc Quad: AROM, Both, Seated, 10 reps   Assessment/Plan    PT Assessment Patient needs  continued PT services  PT Problem List Decreased strength;Decreased mobility;Decreased safety awareness;Decreased activity tolerance;Decreased balance;Decreased knowledge of use of DME;Decreased knowledge of precautions;Cardiopulmonary status limiting activity       PT Treatment Interventions Gait training;Balance training;Therapeutic exercise;DME instruction;Cognitive remediation;Therapeutic activities;Patient/family education;Functional mobility training    PT Goals (Current goals can be found in the Care Plan section)  Acute Rehab PT Goals Patient Stated Goal: return to reading and crossword puzzles PT Goal Formulation: With patient Time For Goal Achievement: 11/16/21 Potential to Achieve Goals: Fair    Frequency       Co-evaluation               AM-PAC PT "6 Clicks" Mobility  Outcome Measure Help needed turning from your back to your side while in a flat bed without using bedrails?: A Lot Help needed moving from lying on your back to sitting on the side of a flat bed without using bedrails?: Total Help needed moving to and from a bed to a chair (including a wheelchair)?: Total Help needed standing up from a chair using your arms (e.g., wheelchair or bedside chair)?: Total Help needed to walk in hospital room?: Total Help needed climbing 3-5 steps with a railing? : Total 6 Click Score: 7  End of Session Equipment Utilized During Treatment: Other (comment) (sling) Activity Tolerance: Patient tolerated treatment well Patient left: in chair;with call bell/phone within reach;with chair alarm set Nurse Communication: Mobility status PT Visit Diagnosis: Other abnormalities of gait and mobility (R26.89);Difficulty in walking, not elsewhere classified (R26.2);Muscle weakness (generalized) (M62.81);Unsteadiness on feet (R26.81)    Time: 9935-7017 PT Time Calculation (min) (ACUTE ONLY): 36 min   Charges:   PT Evaluation $PT Eval Moderate Complexity: 1 Mod PT  Treatments $Therapeutic Activity: 8-22 mins        Vallerie Hentz P, PT Acute Rehabilitation Services Pager: [phone] Office: [phone]   Sandy Salaam Zaria Taha 11/02/2021, 12:38 PM

## 2021-11-02 NOTE — TOC Initial Note (Signed)
Transition of Care [facility]) - Initial/Assessment Note    Patient Details  Name: Deborah Cooke MRN: [id number] Date of Birth: 1950-10-07  [facility]) CM/SW Contact:    Milas Gain, [place] Phone Number: 11/02/2021, 4:14 PM  Clinical Narrative:                  CSW received consult for possible SNF placement at time of discharge.Due to patients current orientation CSW spoke with patients spouse [name] regarding PT recommendation of SNF placement at time of discharge. Patients spouse reports patient comes from home with him.Patients spouse expressed understanding of PT recommendation and is agreeable to SNF placement for patient  at time of discharge. Patients spouse reports number one choice is [facility]. Patients spouse gave CSW permission to fax out initial referral near the [place], [place], and [place] area.CSW discussed insurance authorization process with patients spouse and will provide Medicare SNF ratings list with accepted SNF bed offers when available. CSW informed patients spouse we will fax out initial referral when patients cortrak is removed. Patients spouse reports patients has not received the COVID vaccines. No further questions reported at this time. CSW to continue to follow and assist with discharge planning needs.   Expected Discharge Plan: Skilled Nursing Facility Barriers to Discharge: Continued Medical Work up   Patient Goals and CMS Choice   CMS Medicare.gov Compare Post Acute Care list provided to:: Patient Represenative (must comment) (Patients spouse [name]) Choice offered to / list presented to : Spouse (pateints spouse [name])  Expected Discharge Plan and Services Expected Discharge Plan: [place] In-house Referral: Clinical Social Work     Living arrangements for the past 2 months: [place]                                      Prior Living Arrangements/Services Living arrangements for the past  2 months: Single Family Home Lives with:: Self, Spouse Patient language and need for interpreter reviewed:: Yes Do you feel safe going back to the place where you live?: No   SNF  Need for Family Participation in Patient Care: Yes (Comment) Care giver support system in place?: Yes (comment)   Criminal Activity/Legal Involvement Pertinent to Current Situation/Hospitalization: No - Comment as needed  Activities of Daily Living Home Assistive Devices/Equipment: Eyeglasses, Environmental consultant (specify type), Cane (specify quad or straight), Dentures (specify type) ADL Screening (condition at time of admission) Patient's cognitive ability adequate to safely complete daily activities?: Yes Is the patient deaf or have difficulty hearing?: No Does the patient have difficulty seeing, even when wearing glasses/contacts?: No Does the patient have difficulty concentrating, remembering, or making decisions?: No Patient able to express need for assistance with ADLs?: Yes Does the patient have difficulty dressing or bathing?: No Independently performs ADLs?: Yes (appropriate for developmental age) Does the patient have difficulty walking or climbing stairs?: Yes Weakness of Legs: None Weakness of Arms/Hands: None  Permission Sought/Granted Permission sought to share information with : Case Manager, Family Supports, Chartered certified accountant granted to share information with : No  Share Information with NAME: Patient currently only oriented to person and place CSW spoke with patinets spouse [name]  Permission granted to share info w AGENCY: Patient currently only oriented to person and place CSW spoke with patinets spouse Michael/SNF  Permission granted to share info w Relationship: Patient currently only oriented to person and  place CSW spoke with patinets spouse [name]  Permission granted to share info w Contact Information: Patient currently only oriented to person and place CSW spoke with  patinets spouse [phone]  Emotional Assessment       Orientation: : Oriented to Self, Oriented to Place Alcohol / Substance Use: Not Applicable Psych Involvement: No (comment)  Admission diagnosis:  Heart block AV complete ([place]) [I44.2] S/P CABG x 4 [Z95.1] Patient Active Problem List   Diagnosis Date Noted   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   Acute MI, inferior wall (H[facility])    S/P reverse total shoulder arthroplasty, right 10/20/2021   Rotator cuff arthropathy of right shoulder 09/22/2021   Type I or II open comminuted intra-articular fracture of distal end of left femur, initial encounter ([place]) 10/15/2020   Closed fracture of left distal femur ([place]) 10/14/2020   Genetic testing 04/21/2017   Family history of breast cancer    Malignant neoplasm of upper-outer quadrant of left breast in female, estrogen receptor positive ([place]) 03/11/2017   Hypertension    Thyroid disease    Osteoporosis    Anxiety    Acute blood loss anemia 09/19/2012   Chronic pain 09/19/2012   Osteoporosis with fracture 09/19/2012   Closed supracondylar fracture of right femur ([place]) 09/18/2012   PCP:  Rich Fuchs, PA Pharmacy:   [id number] - [place], [place] - [address] AT [address] [place] [address] Phone: [phone] Fax: [phone]     Social Determinants of Health (SDOH) Interventions    Readmission Risk Interventions No flowsheet data found.

## 2021-11-02 NOTE — Progress Notes (Signed)
Peripherally Inserted Central Catheter Placement  The IV Nurse has discussed with the patient and/or persons authorized to consent for the patient, the purpose of this procedure and the potential benefits and risks involved with this procedure.  The benefits include less needle sticks, lab draws from the catheter, and the patient may be discharged home with the catheter. Risks include, but not limited to, infection, bleeding, blood clot (thrombus formation), and puncture of an artery; nerve damage and irregular heartbeat and possibility to perform a PICC exchange if needed/ordered by physician.  Alternatives to this procedure were also discussed.  Bard Power PICC patient education guide, fact sheet on infection prevention and patient information card has been provided to patient /or left at bedside.    PICC Placement Documentation  PICC Double Lumen 11/02/21 PICC Left Brachial 46 cm 2 cm (Active)  Indication for Insertion or Continuance of Line Vasoactive infusions 11/02/21 1100  Exposed Catheter (cm) 2 cm 11/02/21 1100  Site Assessment Clean;Dry;Intact 11/02/21 1100  Lumen #1 Status Flushed;Saline locked;Blood return noted 11/02/21 1100  Lumen #2 Status Flushed;Saline locked;Blood return noted 11/02/21 1100  Dressing Type Transparent;Securing device 11/02/21 1100  Dressing Status Clean;Dry;Intact 11/02/21 1100  Antimicrobial disc in place? Yes 11/02/21 1100  Safety Lock Not Applicable 34/74/25 9563  Dressing Intervention New dressing 11/02/21 1100  Dressing Change Due 11/09/21 11/02/21 1100       Kaladin Noseworthy 11/02/2021, 11:07 AM

## 2021-11-02 NOTE — Plan of Care (Signed)
°  Problem: Education: Goal: Knowledge of General Education information will improve Description: Including pain rating scale, medication(s)/side effects and non-pharmacologic comfort measures Outcome: Progressing   Problem: Health Behavior/Discharge Planning: Goal: Ability to manage health-related needs will improve Outcome: Progressing   Problem: Clinical Measurements: Goal: Respiratory complications will improve Outcome: Progressing   Problem: Activity: Goal: Risk for activity intolerance will decrease Outcome: Progressing   Problem: Nutrition: Goal: Adequate nutrition will be maintained Outcome: Progressing   Problem: Elimination: Goal: Will not experience complications related to bowel motility Outcome: Progressing Goal: Will not experience complications related to urinary retention Outcome: Progressing   Problem: Pain Managment: Goal: General experience of comfort will improve Outcome: Progressing   Problem: Safety: Goal: Ability to remain free from injury will improve Outcome: Progressing

## 2021-11-02 NOTE — Progress Notes (Addendum)
TCTS DAILY ICU PROGRESS NOTE                   [address]            ,[place] [address]          [phone]   6 Days Post-Op Procedure(s) (LRB): CORONARY ARTERY BYPASS GRAFTING (CABG) TIMES FOUR, USING LEFT INTERNAL MAMMARY ARTERY AND RIGHT LEG GREATER SAPHENOUS VEIN HARVESTED ENDOSCOPICALLY AND RIGHT CORONARY THROMBECTOMY (N/A) TRANSESOPHAGEAL ECHOCARDIOGRAM (TEE) (N/A) APPLICATION OF CELL SAVER ENDOVEIN HARVEST OF GREATER SAPHENOUS VEIN (Right)  Total Length of Stay:  LOS: 7 days   Subjective:  Sitting up in the chair. Awake and cooperative. Asking for something to drink. Passing gas and had several loose stools past 24  hours.   Milrinone 0.56mg/kg/min Levo  454m/min  -> off CVP   7-73m45m CoOx   65  Objective: Vital signs in last 24 hours: Temp:  [97.8 F (36.6 C)-99.5 F (37.5 C)] 99 F (37.2 C) (02/13 1136) Pulse Rate:  [77-130] 130 (02/13 1230) Cardiac Rhythm: Normal sinus rhythm (02/13 0000) Resp:  [14-29] 22 (02/13 0600) BP: (148)/(70) 148/70 (02/13 1230) SpO2:  [92 %-99 %] 94 % (02/13 0500) Weight:  [124.6 kg] 124.6 kg (02/13 0500)  Filed Weights   10/31/21 0500 11/01/21 0437 11/02/21 0500  Weight: 123.4 kg 120.8 kg 124.6 kg    Weight change: 3.8 kg   Hemodynamic parameters for last 24 hours: PAP: (22-31)/(6-18) 22/9 CVP:  [8 mmHg-13 mmHg] 8 mmHg  Intake/Output from previous day: 02/12 0701 - 02/13 0700 In: 1498.3 [I.V.:787.3; NG/GT:60; IV Piggyback:651] Out: 2777.5 [Urine:2125; Drains:52.5; Stool:600]  Intake/Output this shift: No intake/output data recorded.  Current Meds: Scheduled Meds:  aspirin  325 mg Per Tube Daily   chlorhexidine gluconate (MEDLINE KIT)  15 mL Mouth Rinse BID   Chlorhexidine Gluconate Cloth  6 each Topical Daily   clopidogrel  75 mg Per NG tube Daily   docusate  200 mg Per Tube Daily   enoxaparin (LOVENOX) injection  40 mg Subcutaneous Q24H   furosemide  40 mg Intravenous BID   insulin aspart   0-24 Units Subcutaneous Q4H   mouth rinse  15 mL Mouth Rinse 10 times per day   metoCLOPramide (REGLAN) injection  10 mg Intravenous Q8H   metolazone  10 mg Per Tube Daily   neostigmine  0.25 mg Subcutaneous Q6H   pantoprazole sodium  40 mg Per Tube Daily   potassium chloride  20 mEq Per Tube Q4H   sennosides  5 mL Per Tube Daily   sodium chloride flush  10-40 mL Intracatheter Q12H   sodium chloride flush  3 mL Intravenous Q12H   Continuous Infusions:  sodium chloride Stopped (10/28/21 1408)   sodium chloride 20 mL/hr at 10/27/21 1621   sodium chloride 10 mL/hr at 10/29/21 2200   amiodarone 30 mg/hr (11/02/21 0600)   lactated ringers     milrinone 0.125 mcg/kg/min (11/02/21 0927)   piperacillin-tazobactam (ZOSYN)  IV 12.5 mL/hr at 11/02/21 0600   vancomycin Stopped (11/01/21 1729)   PRN Meds:.sodium chloride, metoprolol tartrate, morphine injection, ondansetron (ZOFRAN) IV, oxyCODONE, sodium chloride flush, sodium chloride flush, traMADol  General appearance: Alert and cooperative, no distress.  Neurologic: no focal deficits. Heart: SR /ST currently. She has had some atrial fibrillation this morning as well.    Lungs: breath sounds are clear with good air movement bilat. CXR reviewed, report below. Abdomen: firm, no tenderness. Active bowel sounds.  Extremities:  Warm with good distal perfusion throughout.  IABP insertion site is soft.  Wound: the sternotomy incision is open to air and is intact and dry. The rright EVH incisions are covered with dry dressings. The RLE JP had 53m dark bloody drainage past yesterday, another 365mthis morning  Lab Results: CBC: Recent Labs    11/01/21 0357 11/02/21 0342 11/02/21 0347  WBC 12.4* 15.8*  --   HGB 8.6* 9.6* 9.5*  HCT 26.8* 28.7* 28.0*  PLT 124* 208  --     BMET:  Recent Labs    11/01/21 0357 11/02/21 0342 11/02/21 0347  NA 137 139 138  K 3.0* 3.3* 3.3*  CL 102 99  --   CO2 26 28  --   GLUCOSE 296* 128*  --   BUN 19 22   --   CREATININE 0.65 0.80  --   CALCIUM 7.0* 8.2*  --      CMET: Lab Results  Component Value Date   WBC 15.8 (H) 11/02/2021   HGB 9.5 (L) 11/02/2021   HCT 28.0 (L) 11/02/2021   PLT 208 11/02/2021   GLUCOSE 128 (H) 11/02/2021   CHOL 154 10/26/2021   TRIG 67 11/01/2021   HDL 55 10/26/2021   LDLCALC 70 10/26/2021   ALT 26 10/30/2021   AST 50 (H) 10/30/2021   NA 138 11/02/2021   K 3.3 (L) 11/02/2021   CL 99 11/02/2021   CREATININE 0.80 11/02/2021   BUN 22 11/02/2021   CO2 28 11/02/2021   TSH 7.256 (H) 10/30/2021   INR 1.0 10/30/2021   HGBA1C 5.3 10/26/2021      PT/INR:  Recent Labs    10/30/21 1503  LABPROT 12.6  INR 1.0    Radiology: DG Abd 1 View  Result Date: 11/02/2021 CLINICAL DATA:  Ileus EXAM: ABDOMEN - 1 VIEW COMPARISON:  10/31/2021; 10/30/2021; 10/29/2021 FINDINGS: Examination is degraded due to patient body habitus. Similar appearing diffuse predominantly colonic gaseous distention. Nondiagnostic evaluation for pneumoperitoneum secondary to supine positioning and exclusion of the lower thorax. No pneumatosis or portal venous gas. No definite acute osseous abnormalities. IMPRESSION: Degraded examination with similar findings most suggestive of clinical suspicion ileus. Electronically Signed   By: JoSandi Mariscal.D.   On: 11/02/2021 07:55   DG CHEST PORT 1 VIEW  Result Date: 11/02/2021 CLINICAL DATA:  Post CABG EXAM: PORTABLE CHEST 1 VIEW COMPARISON:  11/01/2021; 10/31/2021; 10/30/2021; 10/29/2021 FINDINGS: Grossly unchanged enlarged cardiac silhouette and mediastinal contours post median sternotomy and CABG. Interval removal of Swan-Ganz catheter with remaining vascular sheath tip overlying the superior aspect of the SVC. Enteric tube tip projects over the expected location of the gastric fundus. Pulmonary vasculature remains indistinct with cephalization of flow and relative areas of consolidation within the bilateral mid lungs, left greater than right. No definite  pleural effusion. No pneumothorax. No acute osseous abnormalities. Post right total shoulder replacement, lower cervical paraspinal fusion and ACDF, incompletely evaluated. IMPRESSION: 1. Interval removal of PA catheter with remaining vascular sheath tip projected over the superior aspect of the SVC. 2. Similar findings of cardiomegaly and suspected asymmetric alveolar pulmonary edema, left greater than right. Electronically Signed   By: JoSandi Mariscal.D.   On: 11/02/2021 07:53   [place] SITE RITE  Result Date: 11/02/2021 If Site Rite image not attached, placement could not be confirmed due to current cardiac rhythm.    Assessment/Plan: S/P Procedure(s) (LRB): CORONARY ARTERY BYPASS GRAFTING (CABG) TIMES FOUR, USING LEFT INTERNAL MAMMARY ARTERY AND RIGHT  LEG GREATER SAPHENOUS VEIN HARVESTED ENDOSCOPICALLY AND RIGHT CORONARY THROMBECTOMY (N/A) TRANSESOPHAGEAL ECHOCARDIOGRAM (TEE) (N/A) APPLICATION OF CELL SAVER ENDOVEIN HARVEST OF GREATER SAPHENOUS VEIN (Right)  -POD6 Emergency CABG x 4 after presenting with acute STEMI. Hemodynamics have stabilized, weaning the milrinone today. Should be able to d/c the norepinephrine today. PICC in place, will d/c the right IJ line. Mobilize as able.  On Plavix, will decrease the ASA to 7m daily. Add statin and BB when we are sure she is tolerating PO's.   -Peri-op VF and post-op A-fib- Currently SR with PAF. On amiodarone IV at 331mhr.   K+ 3.3, Mg++ 1.8.  Supplementing K+ today.   -PULM- oxygenation adequate on nasal cannula O2.  CXR suggesting pulmonary edema L>R.  Diuresing with IV Lasix BID and metolazone.  On day 2 IV Vanc and Zosyn for possible PNA.   -HEME- expected acute blood loss anemia. Transfused early post-op. Hct is now stable. Monitoring.   -GI / nutrition- Post-op ileus resolving. Passing gas and stool. On scheduled Reglan q8h. Allow PO's today and observe. Has CorTrak in place but may be able to avoid starting TF.   -RENAL- Normal creat,   Diuresing as hemodynamics will tolerate.  -NEURO-intact  -DVT PPX-on daily enoxaparin  MyAntony OdeaPA-C [id number]/13/2023 12:33 PM  Patient able to sit up and transfer to chair today with physical therapy GI function is improving and have started liquid diet cautiously Continue IV amiodarone to maintain sinus rhythm and milrinone A-line and IJ sheath removed today Continue IV Lasix for diuresis  patient examined and medical record reviewed,agree with above note. PeDahlia Byes/13/2023

## 2021-11-03 ENCOUNTER — Inpatient Hospital Stay (HOSPITAL_COMMUNITY): Payer: Medicare Other

## 2021-11-03 LAB — MRSA NEXT GEN BY PCR, NASAL: MRSA by PCR Next Gen: NOT DETECTED

## 2021-11-03 LAB — BASIC METABOLIC PANEL
Anion gap: 11 (ref 5–15)
Anion gap: 11 (ref 5–15)
BUN: 17 mg/dL (ref 8–23)
BUN: 17 mg/dL (ref 8–23)
CO2: 31 mmol/L (ref 22–32)
CO2: 32 mmol/L (ref 22–32)
Calcium: 8.3 mg/dL — ABNORMAL LOW (ref 8.9–10.3)
Calcium: 8.3 mg/dL — ABNORMAL LOW (ref 8.9–10.3)
Chloride: 95 mmol/L — ABNORMAL LOW (ref 98–111)
Chloride: 91 mmol/L — ABNORMAL LOW (ref 98–111)
Creatinine, Ser: 0.75 mg/dL (ref 0.44–1.00)
Creatinine, Ser: 0.79 mg/dL (ref 0.44–1.00)
GFR, Estimated: >60 mL/min (ref >60)
GFR, Estimated: >60 mL/min (ref >60)
Glucose, Bld: 115 mg/dL — ABNORMAL HIGH (ref 70–99)
Glucose, Bld: 156 mg/dL — ABNORMAL HIGH (ref 70–99)
Potassium: 2.8 mmol/L — ABNORMAL LOW (ref 3.5–5.1)
Potassium: 3.2 mmol/L — ABNORMAL LOW (ref 3.5–5.1)
Sodium: 137 mmol/L (ref 135–145)
Sodium: 134 mmol/L — ABNORMAL LOW (ref 135–145)

## 2021-11-03 LAB — GLUCOSE, CAPILLARY
Glucose-Capillary: 112 mg/dL — ABNORMAL HIGH (ref 70–99)
Glucose-Capillary: 116 mg/dL — ABNORMAL HIGH (ref 70–99)
Glucose-Capillary: 122 mg/dL — ABNORMAL HIGH (ref 70–99)
Glucose-Capillary: 149 mg/dL — ABNORMAL HIGH (ref 70–99)
Glucose-Capillary: 68 mg/dL — ABNORMAL LOW (ref 70–99)
Glucose-Capillary: 93 mg/dL (ref 70–99)
Glucose-Capillary: 93 mg/dL (ref 70–99)

## 2021-11-03 LAB — CBC
HCT: 28.9 % — ABNORMAL LOW (ref 36.0–46.0)
Hemoglobin: 8.9 g/dL — ABNORMAL LOW (ref 12.0–15.0)
MCH: 30.1 pg (ref 26.0–34.0)
MCHC: 30.8 g/dL (ref 30.0–36.0)
MCV: 97.6 fL (ref 80.0–100.0)
Platelets: 225 10*3/uL (ref 150–400)
RBC: 2.96 MIL/uL — ABNORMAL LOW (ref 3.87–5.11)
RDW: 17.6 % — ABNORMAL HIGH (ref 11.5–15.5)
WBC: 12.3 10*3/uL — ABNORMAL HIGH (ref 4.0–10.5)
nRBC: 0.7 % — ABNORMAL HIGH (ref 0.0–0.2)

## 2021-11-03 LAB — COOXEMETRY PANEL
Carboxyhemoglobin: 1.4 % (ref 0.5–1.5)
Methemoglobin: 1.1 % (ref 0.0–1.5)
O2 Saturation: 62.2 %
Total hemoglobin: 9.4 g/dL — ABNORMAL LOW (ref 12.0–16.0)

## 2021-11-03 LAB — MAGNESIUM: Magnesium: 2.1 mg/dL (ref 1.7–2.4)

## 2021-11-03 MED ORDER — LIOTHYRONINE SODIUM 25 MCG PO TABS
25.0000 ug | ORAL_TABLET | Freq: Every day | ORAL | Status: DC
  Administered 2021-11-03 – 2021-11-11 (×9): 25 ug via ORAL
  Filled 2021-11-03 (×9): qty 1

## 2021-11-03 MED ORDER — CLOPIDOGREL BISULFATE 75 MG PO TABS
75.0000 mg | ORAL_TABLET | Freq: Every day | ORAL | Status: DC
  Administered 2021-11-03 – 2021-11-11 (×9): 75 mg via ORAL
  Filled 2021-11-03 (×9): qty 1

## 2021-11-03 MED ORDER — POTASSIUM CHLORIDE 20 MEQ PO PACK
20.0000 meq | PACK | ORAL | Status: AC
  Administered 2021-11-03 (×3): 20 meq
  Filled 2021-11-03 (×3): qty 1

## 2021-11-03 MED ORDER — POTASSIUM CHLORIDE CRYS ER 20 MEQ PO TBCR
20.0000 meq | EXTENDED_RELEASE_TABLET | Freq: Three times a day (TID) | ORAL | Status: DC
  Administered 2021-11-04 (×2): 20 meq via ORAL
  Filled 2021-11-03 (×2): qty 1

## 2021-11-03 MED ORDER — POLYETHYLENE GLYCOL 3350 17 G PO PACK
17.0000 g | PACK | Freq: Every day | ORAL | Status: DC
  Administered 2021-11-05: 17 g via ORAL
  Filled 2021-11-03 (×4): qty 1

## 2021-11-03 MED ORDER — ACETAMINOPHEN 325 MG PO TABS
325.0000 mg | ORAL_TABLET | ORAL | Status: DC | PRN
  Administered 2021-11-03 – 2021-11-10 (×3): 325 mg via ORAL
  Filled 2021-11-03 (×3): qty 1

## 2021-11-03 MED ORDER — POTASSIUM CHLORIDE 10 MEQ/50ML IV SOLN
10.0000 meq | INTRAVENOUS | Status: AC
  Administered 2021-11-03 (×2): 10 meq via INTRAVENOUS
  Filled 2021-11-03 (×2): qty 50

## 2021-11-03 MED ORDER — PANTOPRAZOLE SODIUM 40 MG PO TBEC
40.0000 mg | DELAYED_RELEASE_TABLET | Freq: Every day | ORAL | Status: DC
  Administered 2021-11-03 – 2021-11-11 (×9): 40 mg via ORAL
  Filled 2021-11-03 (×9): qty 1

## 2021-11-03 MED ORDER — INSULIN ASPART 100 UNIT/ML IJ SOLN: 0.0000 [IU] | Freq: Every day | INTRAMUSCULAR | Status: DC

## 2021-11-03 MED ORDER — INSULIN ASPART 100 UNIT/ML IJ SOLN
0.0000 [IU] | Freq: Three times a day (TID) | INTRAMUSCULAR | Status: DC
  Administered 2021-11-03: 2 [IU] via SUBCUTANEOUS

## 2021-11-03 MED ORDER — QUETIAPINE FUMARATE 100 MG PO TABS
100.0000 mg | ORAL_TABLET | Freq: Every day | ORAL | Status: DC
  Administered 2021-11-03 – 2021-11-10 (×8): 100 mg via ORAL
  Filled 2021-11-03 (×8): qty 1

## 2021-11-03 MED ORDER — DOCUSATE SODIUM 100 MG PO CAPS
200.0000 mg | ORAL_CAPSULE | Freq: Every day | ORAL | Status: DC
  Administered 2021-11-04 – 2021-11-05 (×2): 200 mg via ORAL
  Filled 2021-11-03 (×8): qty 2

## 2021-11-03 MED ORDER — DULOXETINE HCL 30 MG PO CPEP
30.0000 mg | ORAL_CAPSULE | Freq: Two times a day (BID) | ORAL | Status: DC
  Administered 2021-11-03 – 2021-11-11 (×17): 30 mg via ORAL
  Filled 2021-11-03 (×18): qty 1

## 2021-11-03 MED ORDER — LEVOTHYROXINE SODIUM 88 MCG PO TABS
88.0000 ug | ORAL_TABLET | Freq: Every day | ORAL | Status: DC
  Administered 2021-11-03 – 2021-11-11 (×9): 88 ug via ORAL
  Filled 2021-11-03 (×9): qty 1

## 2021-11-03 MED ORDER — CYCLOBENZAPRINE HCL 10 MG PO TABS
10.0000 mg | ORAL_TABLET | Freq: Three times a day (TID) | ORAL | Status: DC | PRN
  Administered 2021-11-03 (×2): 10 mg via ORAL
  Filled 2021-11-03 (×3): qty 1

## 2021-11-03 MED ORDER — FERROUS SULFATE 325 (65 FE) MG PO TABS
325.0000 mg | ORAL_TABLET | Freq: Every day | ORAL | Status: DC
  Administered 2021-11-04 – 2021-11-11 (×8): 325 mg via ORAL
  Filled 2021-11-03 (×8): qty 1

## 2021-11-03 MED ORDER — ALTEPLASE 2 MG IJ SOLR
2.0000 mg | Freq: Once | INTRAMUSCULAR | Status: AC
  Administered 2021-11-03: 2 mg
  Filled 2021-11-03: qty 2

## 2021-11-03 MED ORDER — TRAMADOL HCL 50 MG PO TABS
50.0000 mg | ORAL_TABLET | ORAL | Status: DC | PRN
  Administered 2021-11-03 – 2021-11-04 (×2): 50 mg via ORAL
  Administered 2021-11-04: 100 mg via ORAL
  Administered 2021-11-05 – 2021-11-06 (×3): 50 mg via ORAL
  Administered 2021-11-07 – 2021-11-10 (×3): 100 mg via ORAL
  Filled 2021-11-03: qty 1
  Filled 2021-11-03: qty 2
  Filled 2021-11-03 (×3): qty 1
  Filled 2021-11-03: qty 2
  Filled 2021-11-03: qty 1
  Filled 2021-11-03 (×2): qty 2

## 2021-11-03 MED ORDER — ADULT MULTIVITAMIN W/MINERALS CH
1.0000 | ORAL_TABLET | Freq: Two times a day (BID) | ORAL | Status: DC
  Administered 2021-11-03 – 2021-11-11 (×17): 1 via ORAL
  Filled 2021-11-03 (×17): qty 1

## 2021-11-03 MED ORDER — POTASSIUM CHLORIDE 10 MEQ/50ML IV SOLN
10.0000 meq | INTRAVENOUS | Status: AC
  Administered 2021-11-03 (×4): 10 meq via INTRAVENOUS
  Filled 2021-11-03 (×4): qty 50

## 2021-11-03 MED ORDER — GABAPENTIN 100 MG PO CAPS
200.0000 mg | ORAL_CAPSULE | Freq: Three times a day (TID) | ORAL | Status: DC
  Administered 2021-11-03 – 2021-11-11 (×26): 200 mg via ORAL
  Filled 2021-11-03 (×26): qty 2

## 2021-11-03 MED ORDER — OXYCODONE HCL 5 MG PO TABS
10.0000 mg | ORAL_TABLET | ORAL | Status: DC | PRN
  Administered 2021-11-03 – 2021-11-10 (×15): 10 mg via ORAL
  Filled 2021-11-03 (×16): qty 2

## 2021-11-03 MED ORDER — METOLAZONE 5 MG PO TABS
10.0000 mg | ORAL_TABLET | Freq: Every day | ORAL | Status: AC
  Administered 2021-11-03: 10 mg via ORAL
  Filled 2021-11-03: qty 2

## 2021-11-03 MED ORDER — BUPROPION HCL ER (XL) 300 MG PO TB24
300.0000 mg | ORAL_TABLET | Freq: Every day | ORAL | Status: DC
  Administered 2021-11-03 – 2021-11-11 (×9): 300 mg via ORAL
  Filled 2021-11-03: qty 1
  Filled 2021-11-03 (×4): qty 2
  Filled 2021-11-03: qty 1
  Filled 2021-11-03 (×4): qty 2

## 2021-11-03 MED ORDER — SENNOSIDES-DOCUSATE SODIUM 8.6-50 MG PO TABS
1.0000 | ORAL_TABLET | Freq: Every day | ORAL | Status: DC
  Administered 2021-11-03 – 2021-11-10 (×3): 1 via ORAL
  Filled 2021-11-03 (×6): qty 1

## 2021-11-03 MED ORDER — CALCIUM CARBONATE ANTACID 500 MG PO CHEW
1.0000 | CHEWABLE_TABLET | Freq: Three times a day (TID) | ORAL | Status: DC
  Administered 2021-11-03 – 2021-11-11 (×24): 200 mg via ORAL
  Filled 2021-11-03 (×24): qty 1

## 2021-11-03 MED ORDER — ROSUVASTATIN CALCIUM 5 MG PO TABS
10.0000 mg | ORAL_TABLET | Freq: Every day | ORAL | Status: DC
  Administered 2021-11-03 – 2021-11-11 (×9): 10 mg via ORAL
  Filled 2021-11-03 (×9): qty 2

## 2021-11-03 MED ORDER — CARBAMAZEPINE ER 200 MG PO TB12
200.0000 mg | ORAL_TABLET | Freq: Two times a day (BID) | ORAL | Status: DC
  Administered 2021-11-03 – 2021-11-05 (×6): 200 mg via ORAL
  Filled 2021-11-03 (×7): qty 1

## 2021-11-03 NOTE — Progress Notes (Signed)
Nutrition Follow-up  DOCUMENTATION CODES:   Morbid obesity  INTERVENTION:   Recommend changing to Heart Healthy diet; no hx of DM, CBGs well controlled  Hx Bariatric Surgery: MVI with Minerals BID; Calcium Carbonate 500 mg TID   NUTRITION DIAGNOSIS:   Inadequate oral intake related to acute illness as evidenced by NPO status.  Being addressed via diet advancement  GOAL:   Patient will meet greater than or equal to 90% of their needs  Progressing  MONITOR:   Vent status, Labs, Weight trends  REASON FOR ASSESSMENT:   Ventilator    ASSESSMENT:   71 yo female admitted with acute STEMI requiring IABP and emergent CABG. PMH includes morbid obesity, HTN, gastric bypass 2003, GERD, depression, hx of breast cancer  Pt tolerated FL breakfast this AM; diet advanced to Heart Healthy/Carb Modified. No recorded po intake but pt reporting good appetite. Looking forward to lunch meal today.   Pt is edentulous, has dentures but these are at home. Husband plans to bring in. Pt reports no problems chewing when dentures in place.   Cortrak to be removed today. Based on chart review, it appears that unfortunately pt did not receive much nutrition via Cortrak. TF started on 2/10 at 25 ml/hr in the evening and then was held on 2/11 AM due to concern for ileus  +BM, BS present.   Mild edema present on exam. Weight down to 119.6 kg; still net positive on exam  Labs: potassium 2.8 (H) Meds: lasix, ss novolog, KCl, ferrous sulfate   Diet Order:   Diet Order             Diet heart healthy/carb modified Room service appropriate? Yes; Fluid consistency: Thin  Diet effective now                   EDUCATION NEEDS:   Not appropriate for education at this time  Skin:  Skin Assessment: Reviewed RN Assessment  Last BM:  2/14  Height:   Ht Readings from Last 1 Encounters:  10/27/21 4\' 11"  (1.499 m)    Weight:   Wt Readings from Last 1 Encounters:  11/03/21 119.6 kg      BMI:  Body mass index is 53.25 kg/m.  Estimated Nutritional Needs:   Kcal:  1700-1900 kcals  Protein:  80-100  Fluid:  >= 1.5 L  Kerman Passey MS, RDN, LDN, CNSC Registered Dietitian III Clinical Nutrition RD Pager and On-Call Pager Number Located in [place]

## 2021-11-03 NOTE — Evaluation (Addendum)
Occupational Therapy Evaluation Patient Details Name: Deborah Cooke MRN: [id number] DOB: 1951-06-06 Today's Date: 11/03/2021   History of Present Illness 71 yo female admittd 2/6 with STEMI and fall at home. Upon ICU admission pt with VT arrest and CPR with ROSC with IABP placed 2/6-2/11. S/p CABGx 4 2/7. PMHx: Rt TSA 10/20/21, breast CA, depression, anxiety, anemia, HTN, thyroid dz, Left femur ORIF   Clinical Impression   PTA, pt was living with her husband and required assistance for bathing/dressing after recent shoulder replacement sx on 1/31. Pt currently requiring Max A for UB ADLs, Max A for LB ADLs, and Min-Mod A +2 for functional transfers. Pt presenting with decreased balance, strength, and activity tolerance. Despite fatigue, pt very motivated to participate in therapy. SpO2 93% on RA. HR 98. BP 125/56 (71) on recliner. Pt would benefit from further acute OT to facilitate safe dc. Recommend dc to SNF for further OT to optimize safety, independence with ADLs, and return to PLOF.      Recommendations for follow up therapy are one component of a multi-disciplinary discharge planning process, led by the attending physician.  Recommendations may be updated based on patient status, additional functional criteria and insurance authorization.   Follow Up Recommendations  Skilled nursing-short term rehab (<3 hours/day)    Assistance Recommended at Discharge Frequent or constant Supervision/Assistance  Patient can return home with the following Two people to help with walking and/or transfers;Two people to help with bathing/dressing/bathroom;Assistance with cooking/housework    Functional Status Assessment  Patient has had a recent decline in their functional status and demonstrates the ability to make significant improvements in function in a reasonable and predictable amount of time.  Equipment Recommendations  None recommended by OT    Recommendations for Other Services        Precautions / Restrictions Precautions Precautions: Sternal;Shoulder;Fall Type of Shoulder Precautions: no shoulder ROM, AROM elbow and wrist ok until cleared by MD, Jp drain right calf Shoulder Interventions: Shoulder sling/immobilizer;Off for dressing/bathing/exercises Precaution Comments: Reviewed sternal precautions Required Braces or Orthoses: Sling Restrictions Weight Bearing Restrictions: Yes RUE Weight Bearing: Non weight bearing      Mobility Bed Mobility Overal bed mobility: Needs Assistance       Supine to sit: Max assist, +2 for physical assistance, HOB elevated     General bed mobility comments: Helicopter method. Max A +2 for elevaitng trunk and bringing hips towards EOB with bed pad. use of HOB to elevate for increased support and adherance to sternal rpecautions    Transfers Overall transfer level: Needs assistance   Transfers: Sit to/from Stand, Bed to chair/wheelchair/BSC Sit to Stand: Mod assist, +2 physical assistance, From elevated surface Stand pivot transfers: Min assist, +2 physical assistance, +2 safety/equipment         General transfer comment: Mod A +2 for power up and weight shift forward. Pt then able to take shuffling steps towards recliner with Min A +2.      Balance Overall balance assessment: Needs assistance Sitting-balance support: No upper extremity supported, Feet supported Sitting balance-Leahy Scale: Fair     Standing balance support: Single extremity supported, During functional activity Standing balance-Leahy Scale: Poor                             ADL either performed or assessed with clinical judgement   ADL Overall ADL's : Needs assistance/impaired Eating/Feeding: Minimal assistance;Sitting Eating/Feeding Details (indicate cue type and reason): Min A  for bilateral coordination Grooming: Minimal assistance;Sitting   Upper Body Bathing: Maximal assistance;Sitting   Lower Body Bathing: Maximal  assistance;Sit to/from stand   Upper Body Dressing : Maximal assistance;Sitting   Lower Body Dressing: Maximal assistance;Sit to/from stand   Toilet Transfer: Minimal assistance;Moderate assistance;+2 for physical assistance;+2 for safety/equipment;Stand-pivot           Functional mobility during ADLs: Minimal assistance;Moderate assistance;+2 for physical assistance;+2 for safety/equipment (stand pivot) General ADL Comments: Pt presenitng with decreased strength, balance, and activity tolerance. Very motivated     Museum/gallery curator      Pertinent Vitals/Pain Pain Assessment Pain Assessment: Faces Faces Pain Scale: Hurts little more Pain Location: R UE Pain Descriptors / Indicators: Grimacing Pain Intervention(s): Monitored during session, Limited activity within patient's tolerance, Repositioned     Hand Dominance Right   Extremity/Trunk Assessment Upper Extremity Assessment Upper Extremity Assessment: RUE deficits/detail RUE Deficits / Details: TSA on 10/20/21. No shoulder ROM. Cleared for hand, wrist, and elbow   Lower Extremity Assessment Lower Extremity Assessment: Defer to PT evaluation       Communication Communication Communication: No difficulties   Cognition Arousal/Alertness: Awake/alert Behavior During Therapy: WFL for tasks assessed/performed Overall Cognitive Status: Impaired/Different from baseline Area of Impairment: Safety/judgement, Problem solving                     Memory: Decreased short-term memory       Problem Solving: Slow processing, Requires verbal cues General Comments: Pt motivated to participate in therapy. Pt requiring increased time for processing. Pt with moments of slight confusing such as stating "I wish you all a happy Thanksgiving." But pt with error recognition and stating "oh wait, no, valentines day!"     General Comments       Exercises Exercises: Shoulder Shoulder Exercises Elbow  Flexion: AAROM, Right, 10 reps, Seated Elbow Extension: AROM, Right, 10 reps, Seated Wrist Flexion: AROM, Right, 10 reps, Seated Wrist Extension: AROM, Right, 10 reps, Seated Digit Composite Flexion: AROM, 10 reps, Both, Seated Composite Extension: AROM, Both, 10 reps, Seated   Shoulder Instructions Shoulder Instructions ROM for elbow, wrist and digits of operated UE: Supervision/safety Sling wearing schedule (on at all times/off for ADL's): Supervision/safety    Home Living Family/patient expects to be discharged to:: Private residence Living Arrangements: Spouse/significant other Available Help at Discharge: Family;Available PRN/intermittently Type of Home: House Home Access: Level entry     Home Layout: One level     Bathroom Shower/Tub: Occupational psychologist: Standard     Home Equipment: Cane - single Barista (2 wheels)          Prior Functioning/Environment Prior Level of Function : Independent/Modified Independent             Mobility Comments: walks with RW or cane ADLs Comments: assist for bathing/dressing since TSA sx        OT Problem List: Decreased strength;Decreased range of motion;Decreased activity tolerance;Impaired balance (sitting and/or standing);Decreased knowledge of use of DME or AE;Decreased knowledge of precautions      OT Treatment/Interventions: Self-care/ADL training;Therapeutic exercise;Energy conservation;DME and/or AE instruction;Therapeutic activities;Patient/family education    OT Goals(Current goals can be found in the care plan section) Acute Rehab OT Goals OT Goal Formulation: With patient Time For Goal Achievement: 11/17/21 Potential to Achieve Goals: Good  OT Frequency: Min 2X/week    Co-evaluation  AM-PAC OT "6 Clicks" Daily Activity     Outcome Measure Help from another person eating meals?: A Little Help from another person taking care of personal grooming?: A Little Help  from another person toileting, which includes using toliet, bedpan, or urinal?: A Lot Help from another person bathing (including washing, rinsing, drying)?: A Lot Help from another person to put on and taking off regular upper body clothing?: A Lot Help from another person to put on and taking off regular lower body clothing?: A Lot 6 Click Score: 14   End of Session Equipment Utilized During Treatment: Other (comment) (Sling) Nurse Communication: Mobility status  Activity Tolerance: Patient tolerated treatment well Patient left: in chair;with call bell/phone within reach  OT Visit Diagnosis: Unsteadiness on feet (R26.81);Other abnormalities of gait and mobility (R26.89);Muscle weakness (generalized) (M62.81);Pain Pain - Right/Left: Right Pain - part of body: Shoulder                Time: 1440-1521 OT Time Calculation (min): 41 min Charges:  OT General Charges $OT Visit: 1 Visit OT Evaluation $OT Eval Moderate Complexity: 1 Mod OT Treatments $Self Care/Home Management : 23-37 mins  Johnell Bas MSOT, OTR/L Acute Rehab Pager: [phone] Office: [place] 11/03/2021, 3:40 PM

## 2021-11-03 NOTE — Progress Notes (Signed)
Notified regarding patient's current inpatient stay. Deborah Cooke is 2 weeks post-op from a right reverse total shoulder arthroplasty. She is without significant shoulder pain today, distal sensation is intact. Surgical incision with fresh dressing since surgery with scant drainage at proximal edge of incision. Ok to leave in place until follow up in our office. She will be NWB with right arm until 6 weeks post-op and should remain in sling at all times. Patient was also examined with Dr. Mardelle Matte who discussed post-operative plan with patient. Please reach out to Dr. Mardelle Matte or myself if any orthopedic questions or concerns while she remains inpatient.    Contact information:   Weekdays 8-5 [address], [place] [phone] After hours and holidays please check [email or web address] for group call information for [facility]  Ventura Bruns, PA-C 11/03/2021, 2:09 PM

## 2021-11-03 NOTE — Progress Notes (Signed)
EVENING ROUNDS NOTE :     [address]       [place] [address]             [phone]                 7 Days Post-Op Procedure(s) (LRB): CORONARY ARTERY BYPASS GRAFTING (CABG) TIMES FOUR, USING LEFT INTERNAL MAMMARY ARTERY AND RIGHT LEG GREATER SAPHENOUS VEIN HARVESTED ENDOSCOPICALLY AND RIGHT CORONARY THROMBECTOMY (N/A) TRANSESOPHAGEAL ECHOCARDIOGRAM (TEE) (N/A) APPLICATION OF CELL SAVER ENDOVEIN HARVEST OF GREATER SAPHENOUS VEIN (Right)   Total Length of Stay:  LOS: 8 days  Events:   No events Stable day    BP (!) 138/54    Pulse 81    Temp 98.3 F (36.8 C) (Oral)    Resp (!) 23    Ht 4\' 11"  (1.499 m)    Wt 119.6 kg    SpO2 96%    BMI 53.25 kg/m   CVP:  [7 mmHg-10 mmHg] 7 mmHg      sodium chloride Stopped (10/28/21 1408)   sodium chloride 20 mL/hr at 10/27/21 1621   sodium chloride 10 mL/hr at 10/29/21 2200   amiodarone 30 mg/hr (11/03/21 2028)   lactated ringers     milrinone 0.125 mcg/kg/min (11/03/21 0800)   piperacillin-tazobactam (ZOSYN)  IV Stopped (11/03/21 1815)   vancomycin 166.7 mL/hr at 11/03/21 1900    I/O last 3 completed shifts: In: 2416.8 [P.O.:630; I.V.:807.8; NG/GT:360; IV Piggyback:618.9] Out: 6490 [Urine:6360; Drains:130]   CBC Latest Ref Rng & Units 11/03/2021 11/02/2021 11/02/2021  WBC 4.0 - 10.5 K/uL 12.3(H) - 15.8(H)  Hemoglobin 12.0 - 15.0 g/dL 8.9(L) 9.5(L) 9.6(L)  Hematocrit 36.0 - 46.0 % 28.9(L) 28.0(L) 28.7(L)  Platelets 150 - 400 K/uL 225 - 208    BMP Latest Ref Rng & Units 11/03/2021 11/03/2021 11/02/2021  Glucose 70 - 99 mg/dL 156(H) 115(H) -  BUN 8 - 23 mg/dL 17 17 -  Creatinine 0.44 - 1.00 mg/dL 0.79 0.75 -  Sodium 135 - 145 mmol/L 134(L) 137 138  Potassium 3.5 - 5.1 mmol/L 3.2(L) 2.8(L) 3.3(L)  Chloride 98 - 111 mmol/L 91(L) 95(L) -  CO2 22 - 32 mmol/L 32 31 -  Calcium 8.9 - 10.3 mg/dL 8.3(L) 8.3(L) -    ABG    Component Value Date/Time   PHART 7.467 (H) 11/02/2021 0347   PCO2ART 43.1 11/02/2021 0347    PO2ART 83 11/02/2021 0347   HCO3 31.1 (H) 11/02/2021 0347   TCO2 32 11/02/2021 0347   ACIDBASEDEF 1.0 10/28/2021 0036   O2SAT 62.2 11/03/2021 0343       Melodie Bouillon, MD 11/03/2021 8:42 PM

## 2021-11-03 NOTE — TOC Progression Note (Signed)
Transition of Care [facility]) - Progression Note    Patient Details  Name: Deborah Cooke MRN: [id number] Date of Birth: [birth year]  Transition [facility]) CM/SW [place], [place] Phone Number: 11/03/2021, 1:44 PM  Clinical Narrative:     CSW faxed out initial referral near the [facility], and [place] area. CSW will provide patients spouse with SNF bed offers when available. CSW will start insurance authorization close to patient being medically ready for dc. CSW will continue to follow and assist with patients dc planning needs.  Expected Discharge Plan: [place] Barriers to Discharge: Continued Medical Work up  Expected Discharge Plan and Services Expected Discharge Plan: [place] In-house Referral: Clinical Social Work     Living arrangements for the past 2 months: Single Family Home                                       Social Determinants of Health (SDOH) Interventions    Readmission Risk Interventions No flowsheet data found.

## 2021-11-03 NOTE — Progress Notes (Signed)
7 Days Post-Op Procedure(s) (LRB): CORONARY ARTERY BYPASS GRAFTING (CABG) TIMES FOUR, USING LEFT INTERNAL MAMMARY ARTERY AND RIGHT LEG GREATER SAPHENOUS VEIN HARVESTED ENDOSCOPICALLY AND RIGHT CORONARY THROMBECTOMY (N/A) TRANSESOPHAGEAL ECHOCARDIOGRAM (TEE) (N/A) APPLICATION OF CELL SAVER ENDOVEIN HARVEST OF GREATER SAPHENOUS VEIN (Right) Subjective: Slept well Lots of PACs with low K 2.8  Objective: Vital signs in last 24 hours: Temp:  [97.8 F (36.6 C)-99 F (37.2 C)] 98.4 F (36.9 C) (02/14 0312) Pulse Rate:  [75-130] 107 (02/14 0800) Cardiac Rhythm: Atrial fibrillation (02/13 2030) Resp:  [18-32] 25 (02/14 0800) BP: (106-154)/(55-111) 137/111 (02/14 0800) SpO2:  [94 %-100 %] 96 % (02/14 0700) Weight:  [119.6 kg] 119.6 kg (02/14 0312)  Hemodynamic parameters for last 24 hours: CVP:  [4 mmHg-10 mmHg] 7 mmHg  Intake/Output from previous day: 02/13 0701 - 02/14 0700 In: 1384.2 [P.O.:630; I.V.:346.8; NG/GT:360; IV Piggyback:47.4] Out: 4410 [Urine:4310; Drains:100] Intake/Output this shift: Total I/O In: 597.9 [I.V.:282.8; IV Piggyback:315.1] Out: 175 [Urine:175]  Exam Good bowel sounds Sternal incision clean JP with scant output  Lab Results: Recent Labs    11/02/21 0342 11/02/21 0347 11/03/21 0343  WBC 15.8*  --  12.3*  HGB 9.6* 9.5* 8.9*  HCT 28.7* 28.0* 28.9*  PLT 208  --  225   BMET:  Recent Labs    11/02/21 0342 11/02/21 0347 11/03/21 0343  NA 139 138 137  K 3.3* 3.3* 2.8*  CL 99  --  95*  CO2 28  --  31  GLUCOSE 128*  --  115*  BUN 22  --  17  CREATININE 0.80  --  0.75  CALCIUM 8.2*  --  8.3*    PT/INR: No results for input(s): LABPROT, INR in the last 72 hours. ABG    Component Value Date/Time   PHART 7.467 (H) 11/02/2021 0347   HCO3 31.1 (H) 11/02/2021 0347   TCO2 32 11/02/2021 0347   ACIDBASEDEF 1.0 10/28/2021 0036   O2SAT 62.2 11/03/2021 0343   CBG (last 3)  Recent Labs    11/03/21 0012 11/03/21 0333 11/03/21 0813  GLUCAP 93  116* 112*    Assessment/Plan: S/P Procedure(s) (LRB): CORONARY ARTERY BYPASS GRAFTING (CABG) TIMES FOUR, USING LEFT INTERNAL MAMMARY ARTERY AND RIGHT LEG GREATER SAPHENOUS VEIN HARVESTED ENDOSCOPICALLY AND RIGHT CORONARY THROMBECTOMY (N/A) TRANSESOPHAGEAL ECHOCARDIOGRAM (TEE) (N/A) APPLICATION OF CELL SAVER ENDOVEIN HARVEST OF GREATER SAPHENOUS VEIN (Right) Mobilize Diuresis Diabetes control Adv diet and remove Cortrak Cont iv amio today   LOS: 8 days    Dahlia Byes 11/03/2021

## 2021-11-03 NOTE — Progress Notes (Addendum)
PICC declotting: RN reports difficulty obtaining blood from PICC. Purple lumen noted with blood in lumen, unable to flush. Red lumen infusing. Noted a chest Xray is scheduled. Awaiting results to decide next steps.  53: Line assessed again while awaiting Xray. tPA instilled without difficulty. Portable CXR arriving to room. 5361: Alteplase removed from purple lumen of PICC. Brisk blood return noted. RN made aware.

## 2021-11-03 NOTE — NC FL2 (Signed)
[place] LEVEL OF CARE SCREENING TOOL     IDENTIFICATION  Patient Name: Deborah Cooke Birthdate: [birth year] Sex: female Admission Date (Current Location): 10/26/2021  [facility] and [place] Number:  Herbalist and Address:  [facility] 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   Heart block AV complete ([place]) 10/26/2021   Acute MI, inferior wall ([place])    S/P reverse total shoulder arthroplasty, right 10/20/2021   Rotator cuff arthropathy of right shoulder 09/22/2021   Type I or II open comminuted intra-articular fracture of distal end of left femur, initial encounter ([place]) 10/15/2020   Closed fracture of left distal femur ([place]) 10/14/2020   Genetic testing 04/21/2017   Family history of breast cancer    Malignant neoplasm of upper-outer quadrant of left breast in female, estrogen receptor positive ([place]) 03/11/2017   Hypertension    Thyroid disease    Osteoporosis    Anxiety    Acute blood loss anemia 09/19/2012   Chronic pain 09/19/2012   Osteoporosis with fracture 09/19/2012   Closed supracondylar fracture of right femur ([place]) 09/18/2012    Orientation RESPIRATION BLADDER Height & Weight     Self, Place  O2 (Nasal Cannula 2 liters) Continent (Urethral Catheter double lumen 14 fr.) Weight: 263 lb 10.7 oz (119.6 kg) Height:  _0  (149.9 cm)  BEHAVIORAL SYMPTOMS/MOOD NEUROLOGICAL BOWEL NUTRITION STATUS      Continent Diet (Please see discharge summary)  AMBULATORY STATUS COMMUNICATION OF  NEEDS Skin   Extensive Assist Verbally Other (Comment) (Appropriate for ethnicity,dry,ecchymosis,arm,R,Incision closed,leg,R,gauze,clean,dry,intact,Incision closed chest other,clean,dry,Incision closed shoulder,R,foam lift dressing,clean.dry,intact,dressing in place)                       Personal Care Assistance Level of Assistance  Bathing, Feeding, Dressing Bathing Assistance: Maximum assistance Feeding assistance: Independent (able to feed self) Dressing Assistance: Maximum assistance     Functional Limitations Info  Sight, Hearing, Speech Sight Info: Impaired        SPECIAL CARE FACTORS FREQUENCY  PT (By licensed PT), OT (By licensed OT)     PT Frequency: 5x min weekly OT Frequency: 5x min weekly            Contractures Contractures Info: Not present    Additional Factors Info  Code Status, Allergies, Insulin Sliding Scale Code Status Info: FULL Allergies Info: Nsaids,Ibuprofen,Tolmetin   Insulin Sliding Scale Info: insulin aspart (novoLOG) injection 0-24 Units every 4 hours       Current Medications (11/03/2021):  This is the current hospital active medication list Current Facility-Administered Medications  Medication Dose Route Frequency Provider Last Rate Last Admin   0.45 % sodium chloride infusion   Intravenous Continuous PRN Antony Odea, PA-C   Stopped at 10/28/21 1408   0.9 %  sodium chloride infusion   Intravenous Continuous Antony Odea, PA-C 20 mL/hr at 10/27/21 1621 New Bag at 10/27/21 1621   0.9 %  sodium chloride infusion   Intravenous Continuous Dahlia Byes,  MD 10 mL/hr at 10/29/21 2200 Infusion Verify at 10/29/21 2200   acetaminophen (TYLENOL) tablet 325 mg  325 mg Oral Q4H PRN Dahlia Byes, MD       amiodarone (NEXTERONE PREMIX) 360-4.14 MG/200ML-% (1.8 mg/mL) IV infusion  30 mg/hr Intravenous Continuous Antony Odea, PA-C 16.67 mL/hr at 11/03/21 0800 30 mg/hr at 11/03/21 0800   aspirin EC tablet 81 mg  81 mg Oral  Daily Antony Odea, PA-C   81 mg at 11/03/21 2248   buPROPion (WELLBUTRIN XL) 24 hr tablet 300 mg  300 mg Oral Daily Dahlia Byes, MD   300 mg at 11/03/21 1128   calcium carbonate (TUMS - dosed in mg elemental calcium) chewable tablet 200 mg of elemental calcium  1 tablet Oral TID BM Dahlia Byes, MD       carbamazepine (TEGRETOL XR) 12 hr tablet 200 mg  200 mg Oral BID Dahlia Byes, MD   200 mg at 11/03/21 1114   chlorhexidine gluconate (MEDLINE KIT) (PERIDEX) 0.12 % solution 15 mL  15 mL Mouth Rinse BID Melrose Nakayama, MD   15 mL at 11/03/21 0900   Chlorhexidine Gluconate Cloth 2 % PADS 6 each  6 each Topical Daily Dahlia Byes, MD   6 each at 11/02/21 0845   clopidogrel (PLAVIX) tablet 75 mg  75 mg Oral Daily Dahlia Byes, MD   75 mg at 11/03/21 2500   cyclobenzaprine (FLEXERIL) tablet 10 mg  10 mg Oral TID PRN Dahlia Byes, MD   10 mg at 11/03/21 1114   docusate sodium (COLACE) capsule 200 mg  200 mg Oral Daily Dahlia Byes, MD       DULoxetine (CYMBALTA) DR capsule 30 mg  30 mg Oral BID Dahlia Byes, MD   30 mg at 11/03/21 1114   enoxaparin (LOVENOX) injection 40 mg  40 mg Subcutaneous Q24H Dahlia Byes, MD   40 mg at 11/03/21 0925   [START ON 11/04/2021] ferrous sulfate tablet 325 mg  325 mg Oral Q breakfast Dahlia Byes, MD       furosemide (LASIX) injection 40 mg  40 mg Intravenous BID Dahlia Byes, MD   40 mg at 11/03/21 1115   gabapentin (NEURONTIN) capsule 200 mg  200 mg Oral TID Dahlia Byes, MD   200 mg at 11/03/21 1113   insulin aspart (novoLOG) injection 0-15 Units  0-15 Units Subcutaneous TID WC Dahlia Byes, MD       insulin aspart (novoLOG) injection 0-5 Units  0-5 Units Subcutaneous QHS Dahlia Byes, MD       lactated ringers infusion   Intravenous Continuous Roddenberry, Myron G, PA-C       levothyroxine (SYNTHROID) tablet 88 mcg  88 mcg Oral Q0600 Dahlia Byes, MD   88 mcg at 11/03/21 1113   liothyronine (CYTOMEL)  tablet 25 mcg  25 mcg Oral Daily Dahlia Byes, MD   25 mcg at 11/03/21 1114   MEDLINE mouth rinse  15 mL Mouth Rinse 10 times per day Dahlia Byes, MD   15 mL at 11/03/21 1140   metoprolol tartrate (LOPRESSOR) injection 2.5-5 mg  2.5-5 mg Intravenous Q2H PRN Roddenberry, Myron G, PA-C       milrinone (PRIMACOR) 20 MG/100 ML (0.2 mg/mL) infusion  0.125 mcg/kg/min Intravenous Continuous Dahlia Byes, MD 4.13 mL/hr at 11/03/21 0800 0.125 mcg/kg/min at 11/03/21 0800   multivitamin with minerals tablet 1 tablet  1 tablet Oral BID Dahlia Byes, MD       ondansetron [facility]) injection 4  mg  4 mg Intravenous Q6H PRN Roddenberry, Myron G, PA-C       oxyCODONE (Oxy IR/ROXICODONE) immediate release tablet 10 mg  10 mg Oral Q3H PRN Dahlia Byes, MD   10 mg at 11/03/21 1006   pantoprazole (PROTONIX) EC tablet 40 mg  40 mg Oral Daily Dahlia Byes, MD   40 mg at 11/03/21 0922   piperacillin-tazobactam (ZOSYN) IVPB 3.375 g  3.375 g Intravenous Wille Glaser, MD 12.5 mL/hr at 11/03/21 0800 Infusion Verify at 11/03/21 0800   [START ON 11/04/2021] polyethylene glycol (MIRALAX / GLYCOLAX) packet 17 g  17 g Oral Q breakfast Dahlia Byes, MD       potassium chloride (KLOR-CON) packet 20 mEq  20 mEq Per Tube Q4H Dahlia Byes, MD   20 mEq at 11/03/21 0847   [START ON 11/04/2021] potassium chloride SA (KLOR-CON M) CR tablet 20 mEq  20 mEq Oral TID Dahlia Byes, MD       QUEtiapine (SEROQUEL) tablet 100 mg  100 mg Oral QHS Dahlia Byes, MD       rosuvastatin (CRESTOR) tablet 10 mg  10 mg Oral Daily Dahlia Byes, MD   10 mg at 11/03/21 9718   senna-docusate (Senokot-S) tablet 1 tablet  1 tablet Oral QHS Dahlia Byes, MD       sodium chloride flush (NS) 0.9 % injection 10-40 mL  10-40 mL Intracatheter Q12H Dahlia Byes, MD   10 mL at 11/03/21 0904   sodium chloride flush (NS) 0.9 % injection 10-40 mL  10-40 mL Intracatheter PRN Dahlia Byes, MD       sodium chloride flush (NS) 0.9  % injection 10-40 mL  10-40 mL Intracatheter Q12H Dahlia Byes, MD   10 mL at 11/03/21 0904   sodium chloride flush (NS) 0.9 % injection 10-40 mL  10-40 mL Intracatheter PRN Dahlia Byes, MD       sodium chloride flush (NS) 0.9 % injection 3 mL  3 mL Intravenous Q12H Roddenberry, Myron G, PA-C   3 mL at 11/03/21 0904   sodium chloride flush (NS) 0.9 % injection 3 mL  3 mL Intravenous PRN Roddenberry, Myron G, PA-C       traMADol (ULTRAM) tablet 50-100 mg  50-100 mg Oral Q4H PRN Dahlia Byes, MD       vancomycin (VANCOREADY) IVPB 1250 mg/250 mL  1,250 mg Intravenous Q24H Dahlia Byes, MD   Stopped at 11/02/21 1911     Discharge Medications: Please see discharge summary for a list of discharge medications.  Relevant Imaging Results:  Relevant Lab Results:   Additional Information [id number], Not covid vaccinated  Milas Gain, [place]

## 2021-11-04 ENCOUNTER — Inpatient Hospital Stay (HOSPITAL_COMMUNITY): Payer: Medicare Other

## 2021-11-04 LAB — CBC
HCT: 27.8 % — ABNORMAL LOW (ref 36.0–46.0)
Hemoglobin: 9 g/dL — ABNORMAL LOW (ref 12.0–15.0)
MCH: 30.6 pg (ref 26.0–34.0)
MCHC: 32.4 g/dL (ref 30.0–36.0)
MCV: 94.6 fL (ref 80.0–100.0)
Platelets: 298 10*3/uL (ref 150–400)
RBC: 2.94 MIL/uL — ABNORMAL LOW (ref 3.87–5.11)
RDW: 17 % — ABNORMAL HIGH (ref 11.5–15.5)
WBC: 11.7 10*3/uL — ABNORMAL HIGH (ref 4.0–10.5)
nRBC: 0.3 % — ABNORMAL HIGH (ref 0.0–0.2)

## 2021-11-04 LAB — GLUCOSE, CAPILLARY
Glucose-Capillary: 93 mg/dL (ref 70–99)
Glucose-Capillary: 58 mg/dL — ABNORMAL LOW (ref 70–99)
Glucose-Capillary: 78 mg/dL (ref 70–99)
Glucose-Capillary: 75 mg/dL (ref 70–99)
Glucose-Capillary: 81 mg/dL (ref 70–99)

## 2021-11-04 LAB — BASIC METABOLIC PANEL
Anion gap: 11 (ref 5–15)
BUN: 20 mg/dL (ref 8–23)
CO2: 33 mmol/L — ABNORMAL HIGH (ref 22–32)
Calcium: 8.3 mg/dL — ABNORMAL LOW (ref 8.9–10.3)
Chloride: 90 mmol/L — ABNORMAL LOW (ref 98–111)
Creatinine, Ser: 0.89 mg/dL (ref 0.44–1.00)
GFR, Estimated: >60 mL/min (ref >60)
Glucose, Bld: 137 mg/dL — ABNORMAL HIGH (ref 70–99)
Potassium: 3.2 mmol/L — ABNORMAL LOW (ref 3.5–5.1)
Sodium: 134 mmol/L — ABNORMAL LOW (ref 135–145)

## 2021-11-04 LAB — MAGNESIUM: Magnesium: 1.6 mg/dL — ABNORMAL LOW (ref 1.7–2.4)

## 2021-11-04 LAB — COOXEMETRY PANEL
Carboxyhemoglobin: <2.2 % — ABNORMAL HIGH (ref 0.5–1.5)
Methemoglobin: 0.7 % (ref 0.0–1.5)
O2 Saturation: 65.9 %
Total hemoglobin: 9.4 g/dL — ABNORMAL LOW (ref 12.0–16.0)

## 2021-11-04 MED ORDER — FUROSEMIDE 10 MG/ML IJ SOLN
40.0000 mg | Freq: Every day | INTRAMUSCULAR | Status: DC
  Administered 2021-11-04: 40 mg via INTRAVENOUS
  Filled 2021-11-04: qty 4

## 2021-11-04 MED ORDER — AMIODARONE HCL 200 MG PO TABS
400.0000 mg | ORAL_TABLET | Freq: Two times a day (BID) | ORAL | Status: AC
  Administered 2021-11-04 – 2021-11-07 (×8): 400 mg via ORAL
  Filled 2021-11-04 (×8): qty 2

## 2021-11-04 MED ORDER — LATANOPROST 0.005 % OP SOLN
1.0000 [drp] | Freq: Every day | OPHTHALMIC | Status: DC
  Administered 2021-11-04 – 2021-11-10 (×7): 1 [drp] via OPHTHALMIC
  Filled 2021-11-04: qty 2.5

## 2021-11-04 MED ORDER — POTASSIUM CHLORIDE 10 MEQ/50ML IV SOLN
10.0000 meq | INTRAVENOUS | Status: AC
  Administered 2021-11-04 (×2): 10 meq via INTRAVENOUS
  Filled 2021-11-04: qty 50

## 2021-11-04 MED ORDER — POTASSIUM CHLORIDE 10 MEQ/50ML IV SOLN
10.0000 meq | INTRAVENOUS | Status: AC
  Administered 2021-11-04 (×3): 10 meq via INTRAVENOUS
  Filled 2021-11-04: qty 50

## 2021-11-04 MED ORDER — MAGNESIUM SULFATE 4 GM/100ML IV SOLN
4.0000 g | Freq: Once | INTRAVENOUS | Status: AC
  Administered 2021-11-04: 4 g via INTRAVENOUS
  Filled 2021-11-04: qty 100

## 2021-11-04 MED ORDER — POTASSIUM CHLORIDE CRYS ER 20 MEQ PO TBCR: 20.0000 meq | EXTENDED_RELEASE_TABLET | Freq: Once | ORAL | Status: DC

## 2021-11-04 NOTE — Progress Notes (Signed)
Inpatient Diabetes Program Recommendations  AACE/ADA: New Consensus Statement on Inpatient Glycemic Control  Target Ranges:  Prepandial:   less than 140 mg/dL      Peak postprandial:   less than 180 mg/dL (1-2 hours)      Critically ill patients:  140 - 180 mg/dL    Latest Reference Range & Units 11/03/21 08:13 11/03/21 12:17 11/03/21 16:52 11/03/21 17:25 11/03/21 22:45 11/04/21 07:06  Glucose-Capillary 70 - 99 mg/dL 112 (H) 149 (H)  Novolog 2 units 68 (L) 93 122 (H) 93   Review of Glycemic Control  Diabetes history: No Outpatient Diabetes medications: NA Current orders for Inpatient glycemic control: Novolog 0-15 units TID with meals, Novolog 0-5 units QHS  Inpatient Diabetes Program Recommendations:    Insulin: If Novolog is continued, please decrease to Novolog 0-9 units TID with meals.  Thanks, Barnie Alderman, RN, MSN, CDE Diabetes Coordinator Inpatient Diabetes Program [phone] (Team Pager from 8am to 5pm)

## 2021-11-04 NOTE — Progress Notes (Signed)
Physical Therapy Treatment Patient Details Name: Deborah Cooke MRN: [id number] DOB: [birth year] Today's Date: 11/04/2021   History of Present Illness 71 yo female admittd 2/6 with STEMI and fall at home. Upon ICU admission pt with VT arrest and CPR with ROSC with IABP placed 2/6-2/11. PT s/p cABGx 4 2/7. PMHx: Rt TSA 10/20/21, breast CA, depression, anxiety, anemia, HTN, thyroid dz, Left femur ORIF    PT Comments    Pt making good, steady progress with mobility, transferring to stand and taking a few lateral steps bed > recliner with L UE HHA and minAx2 today. Pt with improved initiation, only needing modAx2 for bed mobility also today. Will continue to follow acutely. Current recommendations remain appropriate.    Recommendations for follow up therapy are one component of a multi-disciplinary discharge planning process, led by the attending physician.  Recommendations may be updated based on patient status, additional functional criteria and insurance authorization.  Follow Up Recommendations  Skilled nursing-short term rehab (<3 hours/day)     Assistance Recommended at Discharge Frequent or constant Supervision/Assistance  Patient can return home with the following A lot of help with walking and/or transfers;A lot of help with bathing/dressing/bathroom;Assistance with cooking/housework;Assist for transportation;Direct supervision/assist for medications management;Help with stairs or ramp for entrance   Equipment Recommendations  BSC/3in1;Hospital bed;Wheelchair (measurements PT);Wheelchair cushion (measurements PT)    Recommendations for Other Services       Precautions / Restrictions Precautions Precautions: Sternal;Fall;Shoulder Type of Shoulder Precautions: no shoulder ROM, AROM elbow and wrist ok until cleared by MD Shoulder Interventions: Shoulder sling/immobilizer;Off for dressing/bathing/exercises Precaution Comments: pt educated for sternal and shoulder precautions Required  Braces or Orthoses: Sling Restrictions Weight Bearing Restrictions: Yes RUE Weight Bearing: Non weight bearing     Mobility  Bed Mobility Overal bed mobility: Needs Assistance Bed Mobility: Supine to Sit     Supine to sit: Mod assist, +2 for physical assistance, HOB elevated, +2 for safety/equipment     General bed mobility comments: ModAx2 to bring legs off EOB with good initiation by pt and to bring trunk up to sit and scoot hips to edge.    Transfers Overall transfer level: Needs assistance Equipment used: 2 person hand held assist Transfers: Sit to/from Stand, Bed to chair/wheelchair/BSC Sit to Stand: Min assist, +2 physical assistance, +2 safety/equipment, From elevated surface   Step pivot transfers: Min assist, +2 physical assistance, +2 safety/equipment       General transfer comment: MinAx2 to power up to stand with L HHA and cues to limit pushing for sternal precautions. MinAx2 to steady and cue pt with weight shifting to step to L bed > recliner.    Ambulation/Gait Ambulation/Gait assistance: Min assist, +2 physical assistance, +2 safety/equipment Gait Distance (Feet): 2 Feet Assistive device: 2 person hand held assist Gait Pattern/deviations: Decreased stride length, Shuffle Gait velocity: reduced Gait velocity interpretation: <1.31 ft/sec, indicative of household ambulator   General Gait Details: Pt with slow, unsteady side steps, needing cues for weight shifting and leg advancement. MinAx2 to steady and direct   Marine scientist [place] (Stroke Patients Only)       Balance Overall balance assessment: Needs assistance Sitting-balance support: No upper extremity supported, Feet supported Sitting balance-Leahy Scale: Fair     Standing balance support: Single extremity supported, During functional activity Standing balance-Leahy Scale: Poor Standing balance comment: Reliant on UE support and external physical  assist  Cognition Arousal/Alertness: Awake/alert Behavior During Therapy: WFL for tasks assessed/performed Overall Cognitive Status: Impaired/Different from baseline Area of Impairment: Safety/judgement, Problem solving, Memory                     Memory: Decreased recall of precautions   Safety/Judgement: Decreased awareness of safety, Decreased awareness of deficits   Problem Solving: Slow processing, Requires verbal cues General Comments: Pt motivated to participate in therapy. Pt requiring increased time for processing. Needs reminders for precautions.        Exercises General Exercises - Lower Extremity Long Arc Quad: AROM, Strengthening, Both, 10 reps, Seated    General Comments        Pertinent Vitals/Pain Pain Assessment Pain Assessment: Faces Faces Pain Scale: Hurts a little bit Pain Location: R UE Pain Descriptors / Indicators: Grimacing Pain Intervention(s): Monitored during session, Limited activity within patient's tolerance, Repositioned    Home Living                          Prior Function            PT Goals (current goals can now be found in the care plan section) Acute Rehab PT Goals Patient Stated Goal: to get OOB PT Goal Formulation: With patient Time For Goal Achievement: 11/16/21 Potential to Achieve Goals: Fair Progress towards PT goals: Progressing toward goals    Frequency    Min 2X/week      PT Plan Equipment recommendations need to be updated    Co-evaluation              AM-PAC PT "6 Clicks" Mobility   Outcome Measure  Help needed turning from your back to your side while in a flat bed without using bedrails?: A Lot Help needed moving from lying on your back to sitting on the side of a flat bed without using bedrails?: Total Help needed moving to and from a bed to a chair (including a wheelchair)?: Total Help needed standing up from a chair using your arms  (e.g., wheelchair or bedside chair)?: Total Help needed to walk in hospital room?: Total Help needed climbing 3-5 steps with a railing? : Total 6 Click Score: 7    End of Session Equipment Utilized During Treatment: Gait belt;Oxygen Activity Tolerance: Patient tolerated treatment well Patient left: in chair;with call bell/phone within reach;with nursing/sitter in room Nurse Communication: Mobility status PT Visit Diagnosis: Other abnormalities of gait and mobility (R26.89);Difficulty in walking, not elsewhere classified (R26.2);Muscle weakness (generalized) (M62.81);Unsteadiness on feet (R26.81)     Time: 0354-6568 PT Time Calculation (min) (ACUTE ONLY): 14 min  Charges:  $Therapeutic Activity: 8-22 mins                     Moishe Spice, PT, DPT Acute Rehabilitation Services  Pager: [phone] Office: [place] 11/04/2021, 6:03 PM

## 2021-11-04 NOTE — TOC Progression Note (Signed)
Transition of Care [facility]) - Progression Note    Patient Details  Name: Deborah Cooke MRN: [id number] Date of Birth: [birth year]  Transition of Care [facility]) CM/SW [place], [place] Phone Number: 11/04/2021, 12:18 PM  Clinical Narrative:     Update- Nicki with [facility] confirmed she can accept patient for SNF when medically ready for dc. CSW following to start insurance authorization close to patient being medically ready for dc.  CSW spoke with patient at bedside regarding SNF recommendation and informed patient that CSW initially spoke with her spouse [name] due to patients previous orientation who was in agreement to short term rehab for patient. CSW informed patient that spouse michael had narrowed SNF choice down to two that he thought would like.Patient confirmed she is agreement to SNF placement and thanked CSW.CSW provided medicare compare snf ratings list with accepted SNF bed offers. Patient chose SN[facility] placement at [facility]. CSW informed patient she will update her spouse [name]. Patient thanked CSW.CSW updated patients spouse [name]. CSW called Nicki with [employer] to confirm SNF bed offer for patient. CSW LVM and waiting callback. CSW will start insurance authorization close to patient being medically ready.  Expected Discharge Plan: [place] Barriers to Discharge: Continued Medical Work up  Expected Discharge Plan and Services Expected Discharge Plan: [place] In-house Referral: Clinical Social Work     Living arrangements for the past 2 months: Single Family Home                                       Social Determinants of Health (SDOH) Interventions    Readmission Risk Interventions No flowsheet data found.

## 2021-11-04 NOTE — Progress Notes (Signed)
Pharmacy Antibiotic Note  Deborah Cooke is a 71 y.o. female admitted on 10/26/2021 s/p CABG. Pt with worsening CXR and possible PNA was started on vancomycin/zosyn on 2/12. Pharmacy has been consulted for vancomycin and Zosyn dosing.  Antibiotics coursed out for 5 days to end on 2/17. Not planning for vancomycin levels given stop date and stable CrCl. Patient is afebrile, WBC down trending, 11.7 today. Unfortunately cultures were not drawn with antibiotic start.   2/14: MRSA PCR negative  Plan: Zosyn 3.375g IV EI q8h Vancomycin 1250mg  IV q24h - est AUC 518 Will discuss with MD today on rounds possibility of stopping vancomycin with negative MRSA PCR (d/w PA this AM however, deferred to MD who is currently in the OR)  Height: 4\' 11"  (149.9 cm) Weight: 117.4 kg (258 lb 13.1 oz) IBW/kg (Calculated) : 43.2  Temp (24hrs), Avg:98 F (36.7 C), Min:97.8 F (36.6 C), Max:98.3 F (36.8 C)  Recent Labs  Lab 10/31/21 0306 11/01/21 0357 11/02/21 0342 11/03/21 0343 11/03/21 1541 11/04/21 0251  WBC 13.1* 12.4* 15.8* 12.3*  --  11.7*  CREATININE 0.65 0.65 0.80 0.75 0.79 0.89     Estimated Creatinine Clearance: 64.8 mL/min (by C-G formula based on SCr of 0.89 mg/dL).    Allergies  Allergen Reactions   Nsaids Nausea And Vomiting and Other (See Comments)    GI Upset (ibuprofen included)   Ibuprofen Other (See Comments)    Other reaction(s): Abdominal Pain   Tolmetin Other (See Comments)    Other reaction(s): Abdominal Pain     Antimicrobials this admission: Vanc 2/12 > [2/17] Zosyn 2/12 > [2/17]  Dose adjustments this admission: none  Microbiology results: none  Thank you for allowing pharmacy to be a part of this patients care.  Cathrine Muster, PharmD PGY2 Cardiology Pharmacy Resident Phone: [phone] 11/04/2021  9:39 AM  Please check [email or web address] for unit-specific pharmacy phone numbers.

## 2021-11-04 NOTE — Progress Notes (Addendum)
T[facility] DAILY ICU PROGRESS NOTE                   [address]            [address],[place] [address]          [phone]   8 Days Post-Op Procedure(s) (LRB): CORONARY ARTERY BYPASS GRAFTING (CABG) TIMES FOUR, USING LEFT INTERNAL MAMMARY ARTERY AND RIGHT LEG GREATER SAPHENOUS VEIN HARVESTED ENDOSCOPICALLY AND RIGHT CORONARY THROMBECTOMY (N/A) TRANSESOPHAGEAL ECHOCARDIOGRAM (TEE) (N/A) APPLICATION OF CELL SAVER ENDOVEIN HARVEST OF GREATER SAPHENOUS VEIN (Right)  Total Length of Stay:  LOS: 9 days   Subjective:  Awake and alert.  She was able to stand and pivot to the chair yesterday but has not been out of bed yet this morning.  Tolerating the carb modified diet.  Having bowel movements.  Milrinone and Levophed off CoOx 66 CVP 6-7  Objective: Vital signs in last 24 hours: Temp:  [97.8 F (36.6 C)-98.4 F (36.9 C)] 98.4 F (36.9 C) (02/15 1123) Pulse Rate:  [78-105] 88 (02/15 0400) Cardiac Rhythm: Normal sinus rhythm (02/14 2100) Resp:  [15-28] 15 (02/15 0400) BP: (94-185)/(52-152) 94/63 (02/15 0400) SpO2:  [95 %-96 %] 96 % (02/14 1600) Weight:  [117.4 kg] 117.4 kg (02/15 0500)  Filed Weights   11/02/21 0500 11/03/21 0312 11/04/21 0500  Weight: 124.6 kg 119.6 kg 117.4 kg    Weight change: -2.2 kg   Hemodynamic parameters for last 24 hours: CVP:  [6 mmHg-12 mmHg] 12 mmHg  Intake/Output from previous day: 02/14 0701 - 02/15 0700 In: 1716.8 [I.V.:916.7; IV Piggyback:800.2] Out: 2680 [Urine:2650; Drains:30]  Intake/Output this shift: No intake/output data recorded.  Current Meds: Scheduled Meds:  amiodarone  400 mg Oral BID   aspirin EC  81 mg Oral Daily   buPROPion  300 mg Oral Daily   calcium carbonate  1 tablet Oral TID BM   carbamazepine  200 mg Oral BID   chlorhexidine gluconate (MEDLINE KIT)  15 mL Mouth Rinse BID   Chlorhexidine Gluconate Cloth  6 each Topical Daily   clopidogrel  75 mg Oral Daily   docusate sodium  200 mg Oral Daily    DULoxetine  30 mg Oral BID   enoxaparin (LOVENOX) injection  40 mg Subcutaneous Q24H   ferrous sulfate  325 mg Oral Q breakfast   furosemide  40 mg Intravenous Daily   gabapentin  200 mg Oral TID   insulin aspart  0-15 Units Subcutaneous TID WC   insulin aspart  0-5 Units Subcutaneous QHS   levothyroxine  88 mcg Oral Q0600   liothyronine  25 mcg Oral Daily   mouth rinse  15 mL Mouth Rinse 10 times per day   multivitamin with minerals  1 tablet Oral BID   pantoprazole  40 mg Oral Daily   polyethylene glycol  17 g Oral Q breakfast   potassium chloride  20 mEq Oral TID   QUEtiapine  100 mg Oral QHS   rosuvastatin  10 mg Oral Daily   senna-docusate  1 tablet Oral QHS   sodium chloride flush  10-40 mL Intracatheter Q12H   sodium chloride flush  10-40 mL Intracatheter Q12H   sodium chloride flush  3 mL Intravenous Q12H   Continuous Infusions:  sodium chloride Stopped (10/28/21 1408)   sodium chloride 20 mL/hr at 10/27/21 1621   sodium chloride 10 mL/hr at 10/29/21 2200   magnesium sulfate bolus IVPB 4 g (11/04/21 0957)   piperacillin-tazobactam (ZOSYN)  IV 3.375 g (11/04/21 0536)   vancomycin Stopped (11/03/21 1950)   PRN Meds:.sodium chloride, acetaminophen, cyclobenzaprine, metoprolol tartrate, ondansetron (ZOFRAN) IV, oxyCODONE, sodium chloride flush, sodium chloride flush, sodium chloride flush, traMADol  General appearance: Alert and cooperative, no distress.  Her son is helping her eat lunch.  The CorTrack feeding tube has been removed Neurologic: no focal deficits. Heart: SR with frequent PVCs.  No further atrial fibrillation past 24 hours.   Lungs: breath sounds are clear with good air movement bilat. chest x-ray is showing improved aeration overall and no significant effusions. Abdomen: Soft, no tenderness. Active bowel sounds.  Extremities: Warm with good distal perfusion throughout.  The JP drain has been removed from the right lower extremity  Wound: the sternotomy incision  is open to air and is intact and dry. The rright EVH incisions are covered with dry dressings.    Lab Results: CBC: Recent Labs    11/03/21 0343 11/04/21 0251  WBC 12.3* 11.7*  HGB 8.9* 9.0*  HCT 28.9* 27.8*  PLT 225 298    BMET:  Recent Labs    11/03/21 1541 11/04/21 0251  NA 134* 134*  K 3.2* 3.2*  CL 91* 90*  CO2 32 33*  GLUCOSE 156* 137*  BUN 17 20  CREATININE 0.79 0.89  CALCIUM 8.3* 8.3*     CMET: Lab Results  Component Value Date   WBC 11.7 (H) 11/04/2021   HGB 9.0 (L) 11/04/2021   HCT 27.8 (L) 11/04/2021   PLT 298 11/04/2021   GLUCOSE 137 (H) 11/04/2021   CHOL 154 10/26/2021   TRIG 67 11/01/2021   HDL 55 10/26/2021   LDLCALC 70 10/26/2021   ALT 26 10/30/2021   AST 50 (H) 10/30/2021   NA 134 (L) 11/04/2021   K 3.2 (L) 11/04/2021   CL 90 (L) 11/04/2021   CREATININE 0.89 11/04/2021   BUN 20 11/04/2021   CO2 33 (H) 11/04/2021   TSH 7.256 (H) 10/30/2021   INR 1.0 10/30/2021   HGBA1C 5.3 10/26/2021      PT/INR:  No results for input(s): LABPROT, INR in the last 72 hours.  Radiology: [facility] Chest Port 1 View  Result Date: 11/04/2021 CLINICAL DATA:  71 year old female postoperative day 8 status post CABG. EXAM: PORTABLE CHEST 1 VIEW COMPARISON:  Portable chest 11/03/2021 and earlier. FINDINGS: Portable AP semi upright view at 0546 hours. Left PICC line remains in place. Enteric feeding tube has been removed. Epicardial pacer wires remain. Larger lung volumes. Sequelae of CABG. Stable cardiac size and mediastinal contours. Lung markings within normal limits. No pneumothorax or pleural effusion. Partially visible total right shoulder arthroplasty. Median sternotomy. Negative visible bowel gas. IMPRESSION: 1. Enteric feeding tube removed. Left PICC line remains in place. 2. Improved lung volumes and no acute cardiopulmonary abnormality. Electronically Signed   By: Genevie Ann M.D.   On: 11/04/2021 08:16     Assessment/Plan: S/P Procedure(s) (LRB): CORONARY  ARTERY BYPASS GRAFTING (CABG) TIMES FOUR, USING LEFT INTERNAL MAMMARY ARTERY AND RIGHT LEG GREATER SAPHENOUS VEIN HARVESTED ENDOSCOPICALLY AND RIGHT CORONARY THROMBECTOMY (N/A) TRANSESOPHAGEAL ECHOCARDIOGRAM (TEE) (N/A) APPLICATION OF CELL SAVER ENDOVEIN HARVEST OF GREATER SAPHENOUS VEIN (Right)  -POD8 Emergency CABG x 4 after presenting with acute STEMI.  Stable vital signs and cardiac rhythm.  Now off milrinone and all vasopressors.  PICC in place. Mobilize as able.  On Plavix, ASA, statin  -Peri-op VF and post-op A-fib- Currently SR with PVC's.  Converting amiodarone to p.o.   Replacing K+ and Mg++.  -  PULM- oxygenation adequate on nasal cannula O2.  CXR improving as she has diuresed well over the past few days.  Continue Lasix 40 mg IV daily on day 4 IV Vanc and Zosyn for possible PNA.   -HEME- expected acute blood loss anemia. Transfused early post-op. Hct is stable. Monitoring.   -GI / nutrition- Post-op ileus resolved.  Tolerating carb modified diet and having more normal bowel function.  -RENAL- Normal creat,  Diuresing but weight is still several Kg above preop  -NEURO-intact  -DVT PPX-on daily enoxaparin  -Disposition evaluated by physical therapy and Occupational Therapy.  Eventual transition to SNF is recommended for additional therapy and strengthening before returning to home.  Antony Odea, PA-C [phone] 11/04/2021 11:50 AM  Stable off drips Needs more PT to resume ambulation CXR, WBC improved with iv antibiotics- plan 5 day course Will remove EPWs closer to time of discharge.

## 2021-11-04 NOTE — Progress Notes (Signed)
° °   °  [address]       [place] [address]             [phone]    POD # 8 CABG x 4  Resting comfortably  BP (!) 117/56    Pulse (!) 121    Temp 98.4 F (36.9 C) (Oral)    Resp (!) 36    Ht 4\' 11"  (1.499 m)    Wt 117.4 kg    SpO2 (!) 69%    BMI 52.28 kg/m  All drips off   Intake/Output Summary (Last 24 hours) at 11/04/2021 1759 Last data filed at 11/04/2021 1437 Gross per 24 hour  Intake 1825.4 ml  Output 2580 ml  Net -754.6 ml   Continue current care  Zayd Bonet [name], MD [facility] [phone]

## 2021-11-05 LAB — GLUCOSE, CAPILLARY
Glucose-Capillary: 54 mg/dL — ABNORMAL LOW (ref 70–99)
Glucose-Capillary: 62 mg/dL — ABNORMAL LOW (ref 70–99)
Glucose-Capillary: 116 mg/dL — ABNORMAL HIGH (ref 70–99)
Glucose-Capillary: 63 mg/dL — ABNORMAL LOW (ref 70–99)
Glucose-Capillary: 96 mg/dL (ref 70–99)
Glucose-Capillary: 26 mg/dL — CL (ref 70–99)
Glucose-Capillary: 82 mg/dL (ref 70–99)

## 2021-11-05 LAB — MAGNESIUM: Magnesium: 2.2 mg/dL (ref 1.7–2.4)

## 2021-11-05 LAB — CBC
HCT: 26.4 % — ABNORMAL LOW (ref 36.0–46.0)
Hemoglobin: 8.6 g/dL — ABNORMAL LOW (ref 12.0–15.0)
MCH: 30.8 pg (ref 26.0–34.0)
MCHC: 32.6 g/dL (ref 30.0–36.0)
MCV: 94.6 fL (ref 80.0–100.0)
Platelets: 332 10*3/uL (ref 150–400)
RBC: 2.79 MIL/uL — ABNORMAL LOW (ref 3.87–5.11)
RDW: 17.1 % — ABNORMAL HIGH (ref 11.5–15.5)
WBC: 9.9 10*3/uL (ref 4.0–10.5)
nRBC: 0 % (ref 0.0–0.2)

## 2021-11-05 LAB — BASIC METABOLIC PANEL
Anion gap: 10 (ref 5–15)
BUN: 18 mg/dL (ref 8–23)
CO2: 35 mmol/L — ABNORMAL HIGH (ref 22–32)
Calcium: 8.2 mg/dL — ABNORMAL LOW (ref 8.9–10.3)
Chloride: 89 mmol/L — ABNORMAL LOW (ref 98–111)
Creatinine, Ser: 0.86 mg/dL (ref 0.44–1.00)
GFR, Estimated: >60 mL/min (ref >60)
Glucose, Bld: 97 mg/dL (ref 70–99)
Potassium: 3.2 mmol/L — ABNORMAL LOW (ref 3.5–5.1)
Sodium: 134 mmol/L — ABNORMAL LOW (ref 135–145)

## 2021-11-05 LAB — COOXEMETRY PANEL
Carboxyhemoglobin: <2.2 % — ABNORMAL HIGH (ref 0.5–1.5)
Methemoglobin: 1.6 % — ABNORMAL HIGH (ref 0.0–1.5)
O2 Saturation: 58.3 %
Total hemoglobin: 7.8 g/dL — ABNORMAL LOW (ref 12.0–16.0)

## 2021-11-05 MED ORDER — POTASSIUM CHLORIDE CRYS ER 20 MEQ PO TBCR
40.0000 meq | EXTENDED_RELEASE_TABLET | Freq: Three times a day (TID) | ORAL | Status: DC
  Administered 2021-11-05 – 2021-11-09 (×15): 40 meq via ORAL
  Filled 2021-11-05 (×15): qty 2

## 2021-11-05 MED ORDER — POTASSIUM CHLORIDE 10 MEQ/50ML IV SOLN
10.0000 meq | INTRAVENOUS | Status: AC
  Administered 2021-11-05 (×3): 10 meq via INTRAVENOUS
  Filled 2021-11-05: qty 50

## 2021-11-05 MED ORDER — VANCOMYCIN HCL 1250 MG/250ML IV SOLN
1250.0000 mg | INTRAVENOUS | Status: AC
  Administered 2021-11-05: 1250 mg via INTRAVENOUS
  Filled 2021-11-05: qty 250

## 2021-11-05 MED ORDER — PIPERACILLIN-TAZOBACTAM 3.375 G IVPB
3.3750 g | Freq: Three times a day (TID) | INTRAVENOUS | Status: AC
  Administered 2021-11-05 (×2): 3.375 g via INTRAVENOUS
  Filled 2021-11-05 (×2): qty 50

## 2021-11-05 MED ORDER — FUROSEMIDE 40 MG PO TABS
40.0000 mg | ORAL_TABLET | Freq: Every day | ORAL | Status: DC
  Administered 2021-11-05 – 2021-11-11 (×7): 40 mg via ORAL
  Filled 2021-11-05 (×7): qty 1

## 2021-11-05 NOTE — Discharge Instructions (Addendum)
Discharge Instructions:  1. You may shower, please wash incisions daily with soap and water and keep dry.  If you wish to cover wounds with dressing you may do so but please keep clean and change daily.  No tub baths or swimming until incisions have completely healed.  If your incisions become red or develop any drainage please call our office at [phone]  2. No Driving until cleared by Dr. Lucianne Lei Trigt's office and you are no longer using narcotic pain medications  3. Monitor your weight daily.. Please use the same scale and weigh at same time... If you gain 5-10 lbs in 48 hours with associated lower extremity swelling, please contact our office at [phone]  4. Fever of 101.5 for at least 24 hours with no source, please contact our office at [phone]  5. Activity- up as tolerated, please walk at least 3 times per day.  Avoid strenuous activity, no lifting, pushing, or pulling with your arms over 8-10 lbs for a minimum of 6 weeks  6. If any questions or concerns arise, please do not hesitate to contact our office at [phone]  Information about your medication: Plavix (anti-platelet agent)  Generic Name (Brand): clopidogrel (Plavix), once daily medication  PURPOSE: You are taking this medication along with aspirin to lower your chance of having a heart attack, stroke, or blood clots in your heart grafts. These can be fatal. Plavix and aspirin help prevent platelets from sticking together and forming a clot that can block an artery or heart graft.   Common SIDE EFFECTS you may experience include: bruising or bleeding more easily, shortness of breath  Do not stop taking PLAVIX without talking to the doctor who prescribes it for you. People who are treated and stop taking Plavix too soon, have a higher risk of getting a blood clot, having a heart attack, or dying. If you stop Plavix because of bleeding, or for other reasons, your risk of a heart attack or stroke may increase.   Avoid  taking NSAID agents or anti-inflammatory medications such as ibuprofen, naproxen given increased bleed risk with plavix - can use acetaminophen (Tylenol) if needed for pain.  Avoid taking over the counter stomach medications omeprazole (Prilosec) or esomeprazole (Nexium) since these do interact and make plavix less effective - ask your pharmacist or doctor for alterative agents if needed for heartburn or GERD.   Tell all of your doctors and dentists that you are taking Plavix. They should talk to the doctor who prescribed Plavix for you before you have any surgery or invasive procedure.   Contact your health care provider if you experience: severe or uncontrollable bleeding, pink/red/brown urine, vomiting blood or vomit that looks like "coffee grounds", red or black stools (looks like tar), coughing up blood or blood clots ----------------------------------------------------------------------------------------------------------------------

## 2021-11-05 NOTE — TOC Progression Note (Signed)
Transition of Care [facility]) - Progression Note    Patient Details  Name: Deborah Cooke MRN: [id number] Date of Birth: [birth year]  Transition of Care [facility] CM/SW [place], [place] Phone Number: 11/05/2021, 11:21 AM  Clinical Narrative:     Patient has SN[facility] bed at [facility] when medically ready for dc. CSW following to start insurance authorization close to patient being medically ready for dc.  Expected Discharge Plan: [place] Barriers to Discharge: Continued Medical Work up  Expected Discharge Plan and Services Expected Discharge Plan: [place] In-house Referral: Clinical Social Work     Living arrangements for the past 2 months: Single Family Home                                       Social Determinants of Health (SDOH) Interventions    Readmission Risk Interventions No flowsheet data found.

## 2021-11-05 NOTE — Discharge Summary (Addendum)
Physician Discharge Summary  Patient ID: ANAMAE ROCHELLE MRN: [id number] DOB/AGE: 11/07/1950 71 y.o.  Admit date: 10/26/2021 Discharge date: 11/11/2021  Admission Diagnoses:  Coronary artery disease Acute ST elevation myocardial infarction Complete heart block Hypertension Morbid obesity Chronic pain History of anxiety disorder   Discharge Diagnoses:   Heart block AV complete (HCC) Coronary artery disease Acute ST elevation myocardial infarction Complete heart block Hypertension Morbid obesity Chronic pain History of anxiety disorder S/P CABG x 4 Expected acute blood loss anemia Postoperative atrial fibrillation Postoperative malnutrition Postoperative ileus with resolution Postoperative deconditioned state Postoperative ventilator dependent respiratory failure    Discharged Condition: stable   History of Present Illness:     71 year old morbidly obese BMI 59 diabetic brought to the ED for inferior STEMI with EKG changes chest pain.  1 week ago she had a right total shoulder replacement and was recovering at home. Cardiac catheterization demonstrated left main distal stenosis and thrombus in a codominant RCA.  LV ejection showed well-preserved LV systolic function and mitral annular calcification.  An attempt at extracting thrombus from the RCA was unsuccessful.  She was started on cangrelor and bivalirudin. The patient is hemodynamically stable with TIMI-3 flow in her coronaries. Due to her severe coronary disease a balloon pump was placed in the Cath Lab as well as a temporary pacing wire for symptomatic bradycardia.   I evaluated the patient in the Cath Lab with Dr. Irish Lack for coordination of care. The patient cannot be treated with PCI due to the distal left main stenosis. The patient is a very poor candidate for CABG because of her morbid obesity which would make it very difficult for her to progress postoperatively for extubation.  She is 4 foot 11 inches tall and  weighs 250 pounds. It is difficult to evaluate her legs for saphenous vein conduit due to the extreme obesity.  Coronary arteriograms indicate adequate targets in the LAD circumflex vessels and a small suboptimal posterior descending branch of the RCA.   This 71 year old morbidly obese female with STEMI found to have severe left main disease and thrombus in RCA with TIMI-3 flow in her vessels and stable hemodynamics on temporary pacing wire.  She is a very poor surgical candidate because of her obesity and the high probability she would never be successfully extubated after sternotomy. I have asked the cardiology team to assess the efficacy of PCI of her distal left main which would also be extremely high risk.  If PCI is not feasible then high risk CABG would be her best chance for survival.   Percutaneous intervention was not felt to be possible by the cardiology team.  An intra-aortic balloon pump was placed in the Cath Lab   Course in Hospital: Patient was taken to the operating room emergently on 10/27/2021 where CABG x4 was carried out.  Native saphenous vein was grafted to the left anterior descending coronary artery.  Cryopreserved saphenous vein was grafted to the posterior descending and sequentially to the OM1 and OM 2 coronary arteries.  Following the procedure, she separated from cardiopulmonary bypass with the intra-aortic aortic balloon pump catheter pulsating at one-to-one and on multiple vasoactive and inotropic agents.  She was transferred to the ICU in critical but stable condition.  She remained sedated, mechanically ventilated, and supported with the balloon pump and several vasoactive drips for the next few days.  After she was adequately diuresed from significant perioperative volume excess, we were able to wean the sedation and the vent support allowing  for successful extubation.  The intra-aortic balloon pump was also weaned and removed by the Cath Lab team on postop day 4.  We then  slowly weaned the inotropic support over the next few days and she tolerated this well.  After she was found to be sufficiently awake and cooperative and able to protect her airway, we allowed for a clear liquid diet that she tolerated with no difficulty.  She had return of appropriate bowel and bladder function.  She developed atrial fibrillation early postoperatively that was managed with IV amiodarone resulting in eventual conversion back to sinus rhythm.  The amiodarone was transitioned to the oral medication.  She developed a postoperative ileus.  We were concerned that she would have a prolonged n.p.o. course so a CorTack feeding tube was placed.  However, the ileus gradually resolved allowing her to resume sufficient oral intake.  The feeding tube was removed.  When she was sufficiently stable for mobilization, we consulted the Occupational Therapy and physical therapy staff for evaluations and treatment.  Her progress with regaining mobility was slow as was expected.  The PT/OT teams recommended eventual transfer to a skilled nursing facility where she could receive additional therapy prior to planned return to independent living.  This plan was discussed with the patient and her husband by the care management staff and they agreed to this plan.  She developed some opacities on her chest x-ray that were concerning for pneumonia.  She was started on IV vancomycin and Zosyn continued for 5 days.  Respiratory status gradually improved.  Her white blood count normalized.  She was hypokalemic and supplemented accordingly.  She is maintaining NSR.  Her ileus has improved and oral intake is increasing. She has been diuresed back down to her pre-surgery weight.  Her surgical incisions are healing without evidence of infection. The interrupted nylon sutures at the 2 incision on the right thigh will be removed in the office in about 1 week.  She is stable for discharge to SNF today.     Consults:  cardiology  Significant Diagnostic Studies:   CLINICAL DATA:  Status post coronary bypass graft.   EXAM: PORTABLE CHEST 1 VIEW   COMPARISON:  November 04, 2021.   FINDINGS: Stable cardiomegaly. Status post coronary bypass graft. Lungs are clear. Status post right shoulder arthroplasty. Stable left-sided PICC line.   IMPRESSION: No active disease.     Electronically Signed   By: Marijo Conception M.D.   On: 11/06/2021 07:50   Treatments:   Discharge Exam: Blood pressure (!) 145/126, pulse 75, temperature 98.1 F (36.7 C), temperature source Oral, resp. rate 17, height 4\' 11"  (1.499 m), weight 112.9 kg, SpO2 95 %. General:appears stronger each day. No distress.  Heart: stable SR. Chest: breath sounds are clear, O2 sats acceptable on RA.  ABD: no tenderness.  Exts: the RLE EVH incisions are healing. Will leave nylon sutures in place and remove next week in the office.  Neuro: intact.  Disposition:  Mrs. Pixley is discharged to [facility] skilled nursing facility in stable condition.     Allergies as of 11/11/2021       Reactions   Nsaids Nausea And Vomiting, Other (See Comments)   GI Upset (ibuprofen included)   Ibuprofen Other (See Comments)   Other reaction(s): Abdominal Pain   Tolmetin Other (See Comments)   Other reaction(s): Abdominal Pain        Medication List     STOP taking these medications  atenolol 25 MG tablet Commonly known as: TENORMIN   diphenhydramine-acetaminophen 25-500 MG Tabs tablet Commonly known as: TYLENOL PM   gabapentin 800 MG tablet Commonly known as: NEURONTIN Replaced by: gabapentin 100 MG capsule   HYDROmorphone 2 MG tablet Commonly known as: Dilaudid   hydrOXYzine 50 MG tablet Commonly known as: ATARAX   lisinopril 20 MG tablet Commonly known as: ZESTRIL   oxyCODONE-acetaminophen 10-325 MG tablet Commonly known as: PERCOCET       TAKE these medications    acetaminophen 325 MG tablet Commonly known as:  TYLENOL Take 2 tablets (650 mg total) by mouth every 6 (six) hours as needed for mild pain or moderate pain.   amiodarone 200 MG tablet Commonly known as: PACERONE Take 1 tablet (200 mg total) by mouth daily.   anastrozole 1 MG tablet Commonly known as: ARIMIDEX TAKE 1 TABLET BY MOUTH  DAILY What changed:  how much to take how to take this when to take this additional instructions   aspirin 81 MG EC tablet Take 1 tablet (81 mg total) by mouth daily. Swallow whole.   B COMPLEX 100 PO Take 1 tablet by mouth daily.   Biotin 10000 MCG Tabs Take 10,000 mcg by mouth daily.   buPROPion 300 MG 24 hr tablet Commonly known as: WELLBUTRIN XL Take 300 mg by mouth daily.   CALCIUM 600 PO Take 600 mg by mouth in the morning and at bedtime.   carbamazepine 200 MG 12 hr tablet Commonly known as: TEGRETOL XR Take 400 mg by mouth 2 (two) times daily.   cholecalciferol 10 MCG (400 UNIT) Tabs tablet Commonly known as: VITAMIN D3 Take 400 Units by mouth daily.   clopidogrel 75 MG tablet Commonly known as: PLAVIX Take 1 tablet (75 mg total) by mouth daily. For 1 year through February 2024.   cyclobenzaprine 10 MG tablet Commonly known as: FLEXERIL Take 1 tablet (10 mg total) by mouth 3 (three) times daily as needed for muscle spasms.   DULoxetine 30 MG capsule Commonly known as: CYMBALTA Take 30 mg by mouth 2 (two) times daily.   ferrous sulfate 325 (65 FE) MG EC tablet Take 325 mg by mouth daily with breakfast.   furosemide 40 MG tablet Commonly known as: LASIX Take 1 tablet (40 mg total) by mouth daily. What changed:  medication strength how much to take when to take this reasons to take this   gabapentin 100 MG capsule Commonly known as: NEURONTIN Take 2 capsules (200 mg total) by mouth 3 (three) times daily. Replaces: gabapentin 800 MG tablet   latanoprost 0.005 % ophthalmic solution Commonly known as: XALATAN Place 1 drop into both eyes at bedtime.    levothyroxine 88 MCG tablet Commonly known as: SYNTHROID Take 88 mcg by mouth daily.   liothyronine 25 MCG tablet Commonly known as: CYTOMEL Take 25 mcg by mouth daily.   metoprolol succinate 25 MG 24 hr tablet Commonly known as: TOPROL-XL Take 1 tablet (25 mg total) by mouth daily.   MULTIVITAMIN/IRON PO Take 1 tablet by mouth daily.   mupirocin ointment 2 % Commonly known as: BACTROBAN Apply 1 application topically 3 (three) times daily as needed for rash or irritation.   omeprazole 20 MG capsule Commonly known as: PRILOSEC Take 20 mg by mouth daily.   ondansetron 4 MG tablet Commonly known as: Zofran Take 1 tablet (4 mg total) by mouth every 8 (eight) hours as needed for nausea or vomiting.   Oxycodone HCl 10 MG Tabs  Take 1 tablet (10 mg total) by mouth every 4 (four) hours as needed for up to 7 days for severe pain.   polyethylene glycol 17 g packet Commonly known as: MIRALAX / GLYCOLAX Take 17 g by mouth 2 (two) times daily. What changed: when to take this   potassium chloride SA 20 MEQ tablet Commonly known as: KLOR-CON M Take 1 tablet (20 mEq total) by mouth daily.   QUEtiapine 100 MG tablet Commonly known as: SEROQUEL Take 2 tablets (200 mg total) by mouth at bedtime. What changed:  medication strength how much to take   rosuvastatin 10 MG tablet Commonly known as: CRESTOR Take 1 tablet (10 mg total) by mouth daily.   simethicone 80 MG chewable tablet Commonly known as: MYLICON Chew 80 mg by mouth every 6 (six) hours as needed for flatulence.        Follow-up Information     Marchia Bond, MD. Schedule an appointment as soon as possible for a visit.   Specialty: Orthopedic Surgery Contact information: [place] [address] [phone]         Dahlia Byes, MD Follow up on 12/07/2021.   Specialty: Cardiothoracic Surgery Why: Your appointment is at 3pm Please arrive 30 minutes early for chest x-ray to  be performed by [facility] located on the first floor of the same building.        [facility]. Go on 11/30/2021.   Specialty: Cardiology Why: Your appointment is with Ms. Nicholes Rough, PA-C at 1:30 PM. Contact information: [address], [place] [phone]        [facility]. Go on 11/17/2021.   Specialty: Cardiothoracic Surgery Why: Your appointment for suture removal is at 11 AM. Contact information: [place], [place] [phone]                Signed: Antony Odea, PA-C 11/11/2021, 8:15 AM  .Arvid Right

## 2021-11-05 NOTE — Hospital Course (Addendum)
History of Present Illness:     71 year old morbidly obese BMI 59 diabetic brought to the ED for inferior STEMI with EKG changes chest pain.  1 week ago she had a right total shoulder replacement and was recovering at home. Cardiac catheterization demonstrated left main distal stenosis and thrombus in a codominant RCA.  LV ejection showed well-preserved LV systolic function and mitral annular calcification.  An attempt at extracting thrombus from the RCA was unsuccessful.  She was started on cangrelor and bivalirudin. The patient is hemodynamically stable with TIMI-3 flow in her coronaries. Due to her severe coronary disease a balloon pump was placed in the Cath Lab as well as a temporary pacing wire for symptomatic bradycardia.   I evaluated the patient in the Cath Lab with Dr. Irish Lack for coordination of care. The patient cannot be treated with PCI due to the distal left main stenosis. The patient is a very poor candidate for CABG because of her morbid obesity which would make it very difficult for her to progress postoperatively for extubation.  She is 4 foot 11 inches tall and weighs 250 pounds. It is difficult to evaluate her legs for saphenous vein conduit due to the extreme obesity.  Coronary arteriograms indicate adequate targets in the LAD circumflex vessels and a small suboptimal posterior descending branch of the RCA.   This 71 year old morbidly obese female with STEMI found to have severe left main disease and thrombus in RCA with TIMI-3 flow in her vessels and stable hemodynamics on temporary pacing wire.  She is a very poor surgical candidate because of her obesity and the high probability she would never be successfully extubated after sternotomy. I have asked the cardiology team to assess the efficacy of PCI of her distal left main which would also be extremely high risk.  If PCI is not feasible then high risk CABG would be her best chance for survival.   Percutaneous intervention was  not felt to be possible by the cardiology team.  An intra-aortic balloon pump was placed in the Cath Lab   Course in Hospital: Patient was taken to the operating room emergently on 10/27/2021 where CABG x4 was carried out.  Native saphenous vein was grafted to the left anterior descending coronary artery.  Cryopreserved saphenous vein was grafted to the posterior descending and sequentially to the OM1 and OM 2 coronary arteries.  Following the procedure, she separated from cardiopulmonary bypass with the intra-aortic aortic balloon pump catheter pulsating at one-to-one and on multiple vasoactive and inotropic agents.  She was transferred to the ICU in critical but stable condition.  She remained sedated, mechanically ventilated, and supported with the balloon pump and several vasoactive drips for the next few days.  After she was adequately diuresed from significant perioperative volume excess, we were able to wean the sedation and the vent support allowing for successful extubation.  The intra-aortic balloon pump was also weaned and removed by the Cath Lab team on postop day 4.  We then slowly weaned the inotropic support over the next few days and she tolerated this well.  After she was found to be sufficiently awake and cooperative and able to protect her airway, we allowed for a clear liquid diet that she tolerated with no difficulty.  She had return of appropriate bowel and bladder function.  She developed atrial fibrillation early postoperatively that was managed with IV amiodarone resulting in eventual conversion back to sinus rhythm.  The amiodarone was transitioned to the oral medication.  She developed a postoperative ileus.  We were concerned that she would have a prolonged n.p.o. course so a CorTack feeding tube was placed.  However, the ileus gradually resolved allowing her to resume sufficient oral intake.  The feeding tube was removed.  When she was sufficiently stable for mobilization, we consulted  the Occupational Therapy and physical therapy staff for evaluations and treatment.  Her progress with regaining mobility was slow as was expected.  The PT/OT teams recommended eventual transfer to a skilled nursing facility where she could receive additional therapy prior to planned return to independent living.  This plan was discussed with the patient and her husband by the care management staff and they agreed to this plan.  She developed some opacities on her chest x-ray that were concerning for pneumonia.  She was started on IV vancomycin and Zosyn continued for 5 days.  Respiratory status gradually improved.  Her white blood count normalized.  She was hypokalemic and supplemented accordingly.  She is maintaining NSR.  Her ileus has improved and oral intake is increasing.  Her surgical incisions are healing without evidence of infection.  She is stable for discharge to SNF today.

## 2021-11-05 NOTE — Progress Notes (Signed)
Patient ID: Deborah Cooke, female   DOB: 03/20/1951, 71 y.o.   MRN: [id number] [facility] Evening Rounds:  Hemodynamically stable in sinus rhythm.   Up in chair.   UO ok, slightly negative today so far.

## 2021-11-05 NOTE — Progress Notes (Signed)
[facility] DAILY ICU PROGRESS NOTE                   [address]            [place] [address]          [phone]   9 Days Post-Op Procedure(s) (LRB): CORONARY ARTERY BYPASS GRAFTING (CABG) TIMES FOUR, USING LEFT INTERNAL MAMMARY ARTERY AND RIGHT LEG GREATER SAPHENOUS VEIN HARVESTED ENDOSCOPICALLY AND RIGHT CORONARY THROMBECTOMY (N/A) TRANSESOPHAGEAL ECHOCARDIOGRAM (TEE) (N/A) APPLICATION OF CELL SAVER ENDOVEIN HARVEST OF GREATER SAPHENOUS VEIN (Right)  Total Length of Stay:  LOS: 10 days   Subjective:  Awake and alert.  Making steady progress with mobility and transfers  per PT documentation.   Stable cardiac rhythm and continues to diurese with net 2L output yesterday.   Tolerating the carb modified diet.  Having bowel movements.   Objective: Vital signs in last 24 hours: Temp:  [96.9 F (36.1 C)-98.7 F (37.1 C)] 96.9 F (36.1 C) (02/16 0352) Pulse Rate:  [52-121] 121 (02/15 1100) Cardiac Rhythm: Normal sinus rhythm (02/16 0800) Resp:  [14-36] 24 (02/16 0800) BP: (99-130)/(45-78) 130/78 (02/16 0800) SpO2:  [69 %-72 %] 69 % (02/15 1100) Weight:  [111.4 kg] 111.4 kg (02/16 0500)  Filed Weights   11/03/21 0312 11/04/21 0500 11/05/21 0500  Weight: 119.6 kg 117.4 kg 111.4 kg    Weight change: -6 kg   Hemodynamic parameters for last 24 hours: CVP:  [9 mmHg-12 mmHg] 10 mmHg  Intake/Output from previous day: 02/15 0701 - 02/16 0700 In: 1123.8 [P.O.:388; I.V.:70.1; IV Piggyback:665.7] Out: 3150 [Urine:3150]  Intake/Output this shift: Total I/O In: 39.9 [IV Piggyback:39.9] Out: 275 [Urine:275]  Current Meds: Scheduled Meds:  amiodarone  400 mg Oral BID   aspirin EC  81 mg Oral Daily   buPROPion  300 mg Oral Daily   calcium carbonate  1 tablet Oral TID BM   carbamazepine  200 mg Oral BID   chlorhexidine gluconate (MEDLINE KIT)  15 mL Mouth Rinse BID   Chlorhexidine Gluconate Cloth  6 each Topical Daily   clopidogrel  75 mg Oral Daily    docusate sodium  200 mg Oral Daily   DULoxetine  30 mg Oral BID   enoxaparin (LOVENOX) injection  40 mg Subcutaneous Q24H   ferrous sulfate  325 mg Oral Q breakfast   furosemide  40 mg Oral Daily   gabapentin  200 mg Oral TID   latanoprost  1 drop Both Eyes QHS   levothyroxine  88 mcg Oral Q0600   liothyronine  25 mcg Oral Daily   multivitamin with minerals  1 tablet Oral BID   pantoprazole  40 mg Oral Daily   polyethylene glycol  17 g Oral Q breakfast   potassium chloride  40 mEq Oral TID   QUEtiapine  100 mg Oral QHS   rosuvastatin  10 mg Oral Daily   senna-docusate  1 tablet Oral QHS   sodium chloride flush  10-40 mL Intracatheter Q12H   sodium chloride flush  10-40 mL Intracatheter Q12H   sodium chloride flush  3 mL Intravenous Q12H   Continuous Infusions:  sodium chloride Stopped (10/28/21 1408)   sodium chloride 20 mL/hr at 10/27/21 1621   sodium chloride 10 mL/hr at 10/29/21 2200   piperacillin-tazobactam (ZOSYN)  IV     potassium chloride 10 mEq (11/05/21 0848)   vancomycin     PRN Meds:.sodium chloride, acetaminophen, cyclobenzaprine, metoprolol tartrate, ondansetron (ZOFRAN) IV, oxyCODONE, sodium  chloride flush, sodium chloride flush, sodium chloride flush, traMADol  General appearance: Alert and cooperative, no distress.  Neurologic: no focal deficits. Heart: SR with frequent PVCs.  No further atrial fibrillation  Lungs: breath sounds are clear with good air movement bilat.  Abdomen: Soft, no tenderness. Active bowel sounds.  Extremities: Warm with good distal perfusion throughout.   Wound: the sternotomy incision is open to air and is intact and dry. The rright EVH incisions are dry.  Lab Results: CBC: Recent Labs    11/04/21 0251 11/05/21 0315  WBC 11.7* 9.9  HGB 9.0* 8.6*  HCT 27.8* 26.4*  PLT 298 332    BMET:  Recent Labs    11/04/21 0251 11/05/21 0315  NA 134* 134*  K 3.2* 3.2*  CL 90* 89*  CO2 33* 35*  GLUCOSE 137* 97  BUN 20 18   CREATININE 0.89 0.86  CALCIUM 8.3* 8.2*     CMET: Lab Results  Component Value Date   WBC 9.9 11/05/2021   HGB 8.6 (L) 11/05/2021   HCT 26.4 (L) 11/05/2021   PLT 332 11/05/2021   GLUCOSE 97 11/05/2021   CHOL 154 10/26/2021   TRIG 67 11/01/2021   HDL 55 10/26/2021   LDLCALC 70 10/26/2021   ALT 26 10/30/2021   AST 50 (H) 10/30/2021   NA 134 (L) 11/05/2021   K 3.2 (L) 11/05/2021   CL 89 (L) 11/05/2021   CREATININE 0.86 11/05/2021   BUN 18 11/05/2021   CO2 35 (H) 11/05/2021   TSH 7.256 (H) 10/30/2021   INR 1.0 10/30/2021   HGBA1C 5.3 10/26/2021      PT/INR:  No results for input(s): LABPROT, INR in the last 72 hours.  Radiology: No results found.   Assessment/Plan: S/P Procedure(s) (LRB): CORONARY ARTERY BYPASS GRAFTING (CABG) TIMES FOUR, USING LEFT INTERNAL MAMMARY ARTERY AND RIGHT LEG GREATER SAPHENOUS VEIN HARVESTED ENDOSCOPICALLY AND RIGHT CORONARY THROMBECTOMY (N/A) TRANSESOPHAGEAL ECHOCARDIOGRAM (TEE) (N/A) APPLICATION OF CELL SAVER ENDOVEIN HARVEST OF GREATER SAPHENOUS VEIN (Right)  -POD9 Emergency CABG x 4 after presenting with acute STEMI.  Stable vital signs and cardiac rhythm.  PICC in place. On Plavix, ASA, statin. Progressing with mobility but remains weak / deconditioned. Continue ICU care and PT /OT. Leave pacer wires in place until ready to transfer.   -Peri-op VF and post-op A-fib- Currently SR with PVC's.  Continue PO amiodarone.   Replacing K+ (3.2 this am).  -PULM- oxygenation adequate on nasal cannula O2.  CXR improving as she has diuresed well over the past few days.  Continue Lasix 40 mg PO daily.  On day 5 of 5 IV Vanc and Zosyn for possible PNA. Remains afebrile and WBC has normalized. Continue to encourage pulmonary hygiene.   -HEME- expected acute blood loss anemia. Transfused early post-op. Hct is stable. Monitoring.   -GI / nutrition- Post-op ileus resolved.  Tolerating carb modified diet and having more normal bowel function.  -RENAL-  maintaining normal creat with steady diuresis.   -NEURO-intact  -DVT PPX-on daily enoxaparin  -Disposition evaluated by physical therapy and Occupational Therapy.  Eventual transition to SNF is recommended for additional therapy and strengthening before returning to home.  Antony Odea, PA-C [phone] 11/05/2021 8:49 AM

## 2021-11-05 NOTE — Progress Notes (Signed)
Occupational Therapy Treatment Patient Details Name: Deborah Cooke MRN: [id number] DOB: 1951/04/02 Today's Date: 11/05/2021   History of present illness 71 yo female admittd 2/6 with STEMI and fall at home. Upon ICU admission pt with VT arrest and CPR with ROSC with IABP placed 2/6-2/11. S/p CABGx 4 2/7. PMHx: Rt TSA 10/20/21, breast CA, depression, anxiety, anemia, HTN, thyroid dz, Left femur ORIF   OT comments  Pt progressing towards established OT goals. Despite significant headache, pt agreeable and motivated to participate in therapy. Pt requiring Mod A for bed mobility. Pt engaging in BLE ROM exercises while sitting at EOB. Pt performing functional transfers with Min A. VSS on 1L O2. Continue to recommend dc to SNF and will continue to follow acutely as admitted.    Recommendations for follow up therapy are one component of a multi-disciplinary discharge planning process, led by the attending physician.  Recommendations may be updated based on patient status, additional functional criteria and insurance authorization.    Follow Up Recommendations  Skilled nursing-short term rehab (<3 hours/day)    Assistance Recommended at Discharge Frequent or constant Supervision/Assistance  Patient can return home with the following  Two people to help with walking and/or transfers;Two people to help with bathing/dressing/bathroom;Assistance with cooking/housework   Equipment Recommendations  None recommended by OT    Recommendations for Other Services      Precautions / Restrictions Precautions Precautions: Sternal;Fall;Shoulder Type of Shoulder Precautions: no shoulder ROM, AROM elbow and wrist ok until cleared by MD Shoulder Interventions: Shoulder sling/immobilizer;Off for dressing/bathing/exercises Precaution Booklet Issued: Yes (comment) Precaution Comments: pt educated for sternal and shoulder precautions Required Braces or Orthoses: Sling Restrictions Weight Bearing Restrictions:  Yes RUE Weight Bearing: Non weight bearing Other Position/Activity Restrictions: Sternal precautions       Mobility Bed Mobility Overal bed mobility: Needs Assistance Bed Mobility: Supine to Sit     Supine to sit: Mod assist, HOB elevated     General bed mobility comments: Pt bringing BLES to EOB. Mod A for elevaitng trunk with HOB elevated. Maintianing sternal precautions    Transfers Overall transfer level: Needs assistance Equipment used: 2 person hand held assist Transfers: Sit to/from Stand, Bed to chair/wheelchair/BSC Sit to Stand: Min assist, From elevated surface Stand pivot transfers: Min assist         General transfer comment: Min A to power up to stand with L HHA and cues to limit pushing for sternal precautions. Min A to steady and cue pt with weight shifting to step to L bed > recliner.     Balance Overall balance assessment: Needs assistance Sitting-balance support: No upper extremity supported, Feet supported Sitting balance-Leahy Scale: Fair     Standing balance support: Single extremity supported, During functional activity Standing balance-Leahy Scale: Fair Standing balance comment: Maintaining static standing                           ADL either performed or assessed with clinical judgement   ADL Overall ADL's : Needs assistance/impaired                     Lower Body Dressing: Maximal assistance;Sit to/from stand Lower Body Dressing Details (indicate cue type and reason): don socks Toilet Transfer: Minimal assistance;Stand-pivot (simulated to recliner)           Functional mobility during ADLs: Minimal assistance (stand pivot) General ADL Comments: Pt presenitng with decreased strength, balance, and activity tolerance. Very  motivated    Extremity/Trunk Assessment Upper Extremity Assessment Upper Extremity Assessment: RUE deficits/detail RUE Deficits / Details: TSA on 10/20/21. No shoulder ROM. Cleared for hand, wrist,  and elbow   Lower Extremity Assessment Lower Extremity Assessment: Defer to PT evaluation        Vision       Perception     Praxis      Cognition Arousal/Alertness: Awake/alert Behavior During Therapy: WFL for tasks assessed/performed Overall Cognitive Status: Impaired/Different from baseline Area of Impairment: Safety/judgement, Problem solving                         Safety/Judgement: Decreased awareness of safety, Decreased awareness of deficits   Problem Solving: Slow processing, Requires verbal cues General Comments: Following cues. very motivated. Limted this session by significant headache        Exercises Exercises: General Lower Extremity General Exercises - Lower Extremity Long Arc Quad: AROM, Both, 10 reps, Seated Shoulder Exercises Elbow Flexion: AAROM, Right, 10 reps, Seated Elbow Extension: AROM, Right, 10 reps, Seated Wrist Flexion: AROM, Right, 10 reps, Seated Wrist Extension: AROM, Right, 10 reps, Seated Digit Composite Flexion: AROM, 10 reps, Both, Seated Composite Extension: AROM, Both, 10 reps, Seated    Shoulder Instructions       General Comments VSS on 1L O2.    Pertinent Vitals/ Pain       Pain Assessment Pain Assessment: 0-10 Pain Score: 6  Pain Location: HA Pain Descriptors / Indicators: Grimacing Pain Intervention(s): Monitored during session, Repositioned, Patient requesting pain meds-RN notified  Home Living                                          Prior Functioning/Environment              Frequency  Min 2X/week        Progress Toward Goals  OT Goals(current goals can now be found in the care plan section)  Progress towards OT goals: Progressing toward goals  Acute Rehab OT Goals OT Goal Formulation: With patient Time For Goal Achievement: 11/17/21 Potential to Achieve Goals: Good ADL Goals Pt Will Perform Upper Body Dressing: with min assist;sitting Pt Will Perform Lower Body  Dressing: with mod assist;sit to/from stand Pt Will Transfer to Toilet: with min assist;ambulating;bedside commode Pt Will Perform Toileting - Clothing Manipulation and hygiene: with mod assist;sit to/from stand;with adaptive equipment Pt/caregiver will Perform Home Exercise Program: Increased ROM;Increased strength;Right Upper extremity;Independently;With written HEP provided Additional ADL Goal #1: Patient/spouse will verbalize and demonstrate as needed understanding of shoulder precaution and prescribed HEP in order to participate in self care routine.  Plan Discharge plan remains appropriate    Co-evaluation                 AM-PAC OT "6 Clicks" Daily Activity     Outcome Measure   Help from another person eating meals?: A Little Help from another person taking care of personal grooming?: A Little Help from another person toileting, which includes using toliet, bedpan, or urinal?: A Lot Help from another person bathing (including washing, rinsing, drying)?: A Lot Help from another person to put on and taking off regular upper body clothing?: A Lot Help from another person to put on and taking off regular lower body clothing?: A Lot 6 Click Score: 14    End of Session Equipment  Utilized During Treatment: Other (comment) (Sling)  OT Visit Diagnosis: Unsteadiness on feet (R26.81);Other abnormalities of gait and mobility (R26.89);Muscle weakness (generalized) (M62.81);Pain Pain - Right/Left: Right Pain - part of body: Shoulder   Activity Tolerance Patient tolerated treatment well   Patient Left in chair;with call bell/phone within reach;with nursing/sitter in room   Nurse Communication Mobility status        Time: 7915-0569 OT Time Calculation (min): 35 min  Charges: OT General Charges $OT Visit: 1 Visit OT Treatments $Self Care/Home Management : 8-22 mins $Therapeutic Activity: 8-22 mins  [place], OTR/L Acute Rehab Pager: [phone] Office:  [place] 11/05/2021, 3:24 PM

## 2021-11-06 ENCOUNTER — Inpatient Hospital Stay (HOSPITAL_COMMUNITY): Payer: Medicare Other

## 2021-11-06 LAB — GLUCOSE, CAPILLARY
Glucose-Capillary: 91 mg/dL (ref 70–99)
Glucose-Capillary: 119 mg/dL — ABNORMAL HIGH (ref 70–99)
Glucose-Capillary: 68 mg/dL — ABNORMAL LOW (ref 70–99)
Glucose-Capillary: 80 mg/dL (ref 70–99)
Glucose-Capillary: 99 mg/dL (ref 70–99)

## 2021-11-06 LAB — BASIC METABOLIC PANEL
Anion gap: 8 (ref 5–15)
BUN: 18 mg/dL (ref 8–23)
CO2: 33 mmol/L — ABNORMAL HIGH (ref 22–32)
Calcium: 8.4 mg/dL — ABNORMAL LOW (ref 8.9–10.3)
Chloride: 91 mmol/L — ABNORMAL LOW (ref 98–111)
Creatinine, Ser: 0.82 mg/dL (ref 0.44–1.00)
GFR, Estimated: >60 mL/min (ref >60)
Glucose, Bld: 105 mg/dL — ABNORMAL HIGH (ref 70–99)
Potassium: 4.1 mmol/L (ref 3.5–5.1)
Sodium: 132 mmol/L — ABNORMAL LOW (ref 135–145)

## 2021-11-06 LAB — CBC
HCT: 27.2 % — ABNORMAL LOW (ref 36.0–46.0)
Hemoglobin: 8.6 g/dL — ABNORMAL LOW (ref 12.0–15.0)
MCH: 30.5 pg (ref 26.0–34.0)
MCHC: 31.6 g/dL (ref 30.0–36.0)
MCV: 96.5 fL (ref 80.0–100.0)
Platelets: 382 10*3/uL (ref 150–400)
RBC: 2.82 MIL/uL — ABNORMAL LOW (ref 3.87–5.11)
RDW: 17.1 % — ABNORMAL HIGH (ref 11.5–15.5)
WBC: 10.8 10*3/uL — ABNORMAL HIGH (ref 4.0–10.5)
nRBC: 0.2 % (ref 0.0–0.2)

## 2021-11-06 LAB — CORTISOL: Cortisol, Plasma: 12 ug/dL

## 2021-11-06 LAB — COOXEMETRY PANEL
Carboxyhemoglobin: 1.8 % — ABNORMAL HIGH (ref 0.5–1.5)
Methemoglobin: <0.7 % (ref 0.0–1.5)
O2 Saturation: 52.1 %
Total hemoglobin: 8.4 g/dL — ABNORMAL LOW (ref 12.0–16.0)

## 2021-11-06 LAB — MAGNESIUM: Magnesium: 2 mg/dL (ref 1.7–2.4)

## 2021-11-06 MED ORDER — AMIODARONE HCL 200 MG PO TABS
200.0000 mg | ORAL_TABLET | Freq: Two times a day (BID) | ORAL | Status: DC
  Administered 2021-11-08 – 2021-11-11 (×7): 200 mg via ORAL
  Filled 2021-11-06 (×7): qty 1

## 2021-11-06 MED ORDER — METOPROLOL SUCCINATE ER 25 MG PO TB24
12.5000 mg | ORAL_TABLET | Freq: Every day | ORAL | Status: DC
  Administered 2021-11-06 – 2021-11-10 (×5): 12.5 mg via ORAL
  Filled 2021-11-06 (×5): qty 1

## 2021-11-06 MED ORDER — CHLORHEXIDINE GLUCONATE 0.12 % MT SOLN
OROMUCOSAL | Status: AC
  Filled 2021-11-06: qty 15

## 2021-11-06 MED ORDER — CARBAMAZEPINE ER 200 MG PO TB12
400.0000 mg | ORAL_TABLET | Freq: Two times a day (BID) | ORAL | Status: DC
  Administered 2021-11-06 – 2021-11-11 (×11): 400 mg via ORAL
  Filled 2021-11-06 (×13): qty 2

## 2021-11-06 NOTE — TOC Progression Note (Signed)
Transition of Care [facility]) - Progression Note    Patient Details  Name: Deborah Cooke MRN: [id number] Date of Birth: Jan 07, 1951  Transition of Care [facility]) CM/SW [place], [place] Phone Number: 11/06/2021, 4:36 PM  Clinical Narrative:      Patient has SNF bed at [facility] when medically ready for dc. CSW following to start insurance authorization close to patient being medically ready for dc.  Expected Discharge Plan: [place] Barriers to Discharge: Continued Medical Work up  Expected Discharge Plan and Services Expected Discharge Plan: [place] In-house Referral: Clinical Social Work     Living arrangements for the past 2 months: Single Family Home                                       Social Determinants of Health (SDOH) Interventions    Readmission Risk Interventions No flowsheet data found.

## 2021-11-06 NOTE — Progress Notes (Signed)
° °   °  [address]       [place] [address]             [phone]    POD # 10 CABG  BP 100/85    Pulse 87    Temp 98.3 F (36.8 C) (Oral)    Resp 18    Ht 4\' 11"  (1.499 m)    Wt 110.8 kg    SpO2 (!) 69%    BMI 49.34 kg/m     Intake/Output Summary (Last 24 hours) at 11/06/2021 1659 Last data filed at 11/06/2021 1602 Gross per 24 hour  Intake 925.2 ml  Output 1950 ml  Net -1024.8 ml   Slow to progress  Remo Lipps [name], MD [facility] [phone]

## 2021-11-06 NOTE — Progress Notes (Addendum)
° °   °  [address]       [place], [address]             [phone]       10 Days Post-Op Procedure(s) (LRB): CORONARY ARTERY BYPASS GRAFTING (CABG) TIMES FOUR, USING LEFT INTERNAL MAMMARY ARTERY AND RIGHT LEG GREATER SAPHENOUS VEIN HARVESTED ENDOSCOPICALLY AND RIGHT CORONARY THROMBECTOMY (N/A) TRANSESOPHAGEAL ECHOCARDIOGRAM (TEE) (N/A) APPLICATION OF CELL SAVER ENDOVEIN HARVEST OF GREATER SAPHENOUS VEIN (Right)  Subjective:  No new complaints, continues to work with PT.. SNF bed offer to [facility] once stable for discharge  Objective: Vital signs in last 24 hours: Temp:  [97.7 F (36.5 C)-98.3 F (36.8 C)] 98.3 F (36.8 C) (02/17 0759) Pulse Rate:  [87] 87 (02/17 0944) Resp:  [18-23] 19 (02/17 0005) BP: (106-144)/(58-102) 124/102 (02/17 0944) Weight:  [110.8 kg] 110.8 kg (02/17 0500)  Intake/Output from previous day: 02/16 0701 - 02/17 0700 In: 677.6 [P.O.:240; IV Piggyback:437.6] Out: 2175 [Urine:2175] Intake/Output this shift: Total I/O In: 240 [P.O.:240] Out: -   General appearance: alert, cooperative, and no distress Heart: regular rate and rhythm Lungs: clear to auscultation bilaterally Abdomen: soft, non-tender; bowel sounds normal; no masses,  no organomegaly Extremities: edema trace Wound: clean and dry  Lab Results: Recent Labs    11/05/21 0315 11/06/21 0333  WBC 9.9 10.8*  HGB 8.6* 8.6*  HCT 26.4* 27.2*  PLT 332 382   BMET:  Recent Labs    11/05/21 0315 11/06/21 0333  NA 134* 132*  K 3.2* 4.1  CL 89* 91*  CO2 35* 33*  GLUCOSE 97 105*  BUN 18 18  CREATININE 0.86 0.82  CALCIUM 8.2* 8.4*    PT/INR: No results for input(s): LABPROT, INR in the last 72 hours. ABG    Component Value Date/Time   PHART 7.467 (H) 11/02/2021 0347   HCO3 31.1 (H) 11/02/2021 0347   TCO2 32 11/02/2021 0347   ACIDBASEDEF 1.0 10/28/2021 0036   O2SAT 52.1 11/06/2021 0344   CBG (last 3)  Recent Labs    11/05/21 1630 11/05/21 1631  11/06/21 0342  GLUCAP 82 26* 91    Assessment/Plan: S/P Procedure(s) (LRB): CORONARY ARTERY BYPASS GRAFTING (CABG) TIMES FOUR, USING LEFT INTERNAL MAMMARY ARTERY AND RIGHT LEG GREATER SAPHENOUS VEIN HARVESTED ENDOSCOPICALLY AND RIGHT CORONARY THROMBECTOMY (N/A) TRANSESOPHAGEAL ECHOCARDIOGRAM (TEE) (N/A) APPLICATION OF CELL SAVER ENDOVEIN HARVEST OF GREATER SAPHENOUS VEIN (Right)  CV- post operative A. Fib, maintaining NSR with PVCs- on Amiodarone, Toprol XL, Plavix Pulm- no acute issues, weaning oxygen as tolerated, has completed IV abx for possible PNA Renal- creatinine is normal at 0.82, hypokalemia resolved, continue lasix and potassium CBGs- hypoglycemia overnight, patient is not a diabetic, oral intake improving, monitor Deconditioning- PT/OT recs SNF, has bed offer at [facility].. timing of discharge per Dr. Prescott Gum   LOS: 11 days    Ellwood Handler, PA-C 11/06/2021  Maintaining sinus rhythm with adequate blood pressure Too weak to walk in the hallway, 2+ assistance to walk to the door Surgical incisions appear to be healing adequately Maximize physical therapy, continue diuresis, continue Plavix for 2 cryopreserved vein grafts  patient examined and medical record reviewed,agree with above note. Dahlia Byes 11/06/2021

## 2021-11-07 LAB — BASIC METABOLIC PANEL
Anion gap: 9 (ref 5–15)
BUN: 19 mg/dL (ref 8–23)
CO2: 31 mmol/L (ref 22–32)
Calcium: 8.3 mg/dL — ABNORMAL LOW (ref 8.9–10.3)
Chloride: 92 mmol/L — ABNORMAL LOW (ref 98–111)
Creatinine, Ser: 0.88 mg/dL (ref 0.44–1.00)
GFR, Estimated: >60 mL/min (ref >60)
Glucose, Bld: 91 mg/dL (ref 70–99)
Potassium: 4.4 mmol/L (ref 3.5–5.1)
Sodium: 132 mmol/L — ABNORMAL LOW (ref 135–145)

## 2021-11-07 LAB — GLUCOSE, CAPILLARY
Glucose-Capillary: 86 mg/dL (ref 70–99)
Glucose-Capillary: 83 mg/dL (ref 70–99)

## 2021-11-07 LAB — C-PEPTIDE: C-Peptide: 3 ng/mL (ref 1.1–4.4)

## 2021-11-07 LAB — MAGNESIUM: Magnesium: 2.1 mg/dL (ref 1.7–2.4)

## 2021-11-07 MED ORDER — CHLORHEXIDINE GLUCONATE 0.12 % MT SOLN
OROMUCOSAL | Status: AC
  Filled 2021-11-07: qty 15

## 2021-11-07 NOTE — Progress Notes (Signed)
° °   °  [address]       [place] [address]             [phone]      Sleeping currently  BP (!) 130/119 (BP Location: Left Arm)    Pulse 87    Temp 97.7 F (36.5 C) (Oral)    Resp 19    Ht 4\' 11"  (1.499 m)    Wt 110 kg    SpO2 (!) 69%    BMI 48.98 kg/m   Intake/Output Summary (Last 24 hours) at 11/07/2021 1755 Last data filed at 11/07/2021 1606 Gross per 24 hour  Intake 480 ml  Output 1650 ml  Net -1170 ml   No new issues  Remo Lipps [name], MD [facility] [phone]

## 2021-11-07 NOTE — Progress Notes (Signed)
11 Days Post-Op Procedure(s) (LRB): CORONARY ARTERY BYPASS GRAFTING (CABG) TIMES FOUR, USING LEFT INTERNAL MAMMARY ARTERY AND RIGHT LEG GREATER SAPHENOUS VEIN HARVESTED ENDOSCOPICALLY AND RIGHT CORONARY THROMBECTOMY (N/A) TRANSESOPHAGEAL ECHOCARDIOGRAM (TEE) (N/A) APPLICATION OF CELL SAVER ENDOVEIN HARVEST OF GREATER SAPHENOUS VEIN (Right) Subjective: No complaints this AM, in good spirits   Objective: Vital signs in last 24 hours: Temp:  [97.7 F (36.5 C)-98.5 F (36.9 C)] 97.7 F (36.5 C) (02/17 1950) Pulse Rate:  [87] 87 (02/17 0944) Cardiac Rhythm: Normal sinus rhythm (02/18 0800) Resp:  [15-23] 19 (02/18 0800) BP: (96-124)/(55-107) 98/74 (02/18 0800) Weight:  [110 kg] 110 kg (02/18 0500)  Hemodynamic parameters for last 24 hours:    Intake/Output from previous day: 02/17 0701 - 02/18 0700 In: 360 [P.O.:360] Out: 1650 [Urine:1650] Intake/Output this shift: Total I/O In: 240 [P.O.:240] Out: -   General appearance: alert, cooperative, and no distress Neurologic: non focal Heart: regular rate and rhythm Wound: dressings in place  Lab Results: Recent Labs    11/05/21 0315 11/06/21 0333  WBC 9.9 10.8*  HGB 8.6* 8.6*  HCT 26.4* 27.2*  PLT 332 382   BMET:  Recent Labs    11/06/21 0333 11/07/21 0341  NA 132* 132*  K 4.1 4.4  CL 91* 92*  CO2 33* 31  GLUCOSE 105* 91  BUN 18 19  CREATININE 0.82 0.88  CALCIUM 8.4* 8.3*    PT/INR: No results for input(s): LABPROT, INR in the last 72 hours. ABG    Component Value Date/Time   PHART 7.467 (H) 11/02/2021 0347   HCO3 31.1 (H) 11/02/2021 0347   TCO2 32 11/02/2021 0347   ACIDBASEDEF 1.0 10/28/2021 0036   O2SAT 52.1 11/06/2021 0344   CBG (last 3)  Recent Labs    11/06/21 1607 11/06/21 2123 11/07/21 0614  GLUCAP 80 99 86    Assessment/Plan: S/P Procedure(s) (LRB): CORONARY ARTERY BYPASS GRAFTING (CABG) TIMES FOUR, USING LEFT INTERNAL MAMMARY ARTERY AND RIGHT LEG GREATER SAPHENOUS VEIN HARVESTED  ENDOSCOPICALLY AND RIGHT CORONARY THROMBECTOMY (N/A) TRANSESOPHAGEAL ECHOCARDIOGRAM (TEE) (N/A) APPLICATION OF CELL SAVER ENDOVEIN HARVEST OF GREATER SAPHENOUS VEIN (Right) POD # 11 CABG Mobility remains primary issue- still very limited CV- in SR on amiodarone, metoprolol  Plavix for cryovein RESP- continue IS RENAL- creatinine and lytes OK CBG normal Continue current Rx   LOS: 12 days    Deborah Cooke 11/07/2021

## 2021-11-08 LAB — CBC
HCT: 27 % — ABNORMAL LOW (ref 36.0–46.0)
Hemoglobin: 8.4 g/dL — ABNORMAL LOW (ref 12.0–15.0)
MCH: 30.3 pg (ref 26.0–34.0)
MCHC: 31.1 g/dL (ref 30.0–36.0)
MCV: 97.5 fL (ref 80.0–100.0)
Platelets: 463 10*3/uL — ABNORMAL HIGH (ref 150–400)
RBC: 2.77 MIL/uL — ABNORMAL LOW (ref 3.87–5.11)
RDW: 17.4 % — ABNORMAL HIGH (ref 11.5–15.5)
WBC: 10 10*3/uL (ref 4.0–10.5)
nRBC: 0 % (ref 0.0–0.2)

## 2021-11-08 LAB — GLUCOSE, CAPILLARY: Glucose-Capillary: 105 mg/dL — ABNORMAL HIGH (ref 70–99)

## 2021-11-08 LAB — MAGNESIUM: Magnesium: 2.2 mg/dL (ref 1.7–2.4)

## 2021-11-08 NOTE — Plan of Care (Signed)
°  Problem: Respiratory: Goal: Respiratory status will improve Outcome: Progressing   Problem: Cardiac: Goal: Will achieve and/or maintain hemodynamic stability Outcome: Progressing   Problem: Urinary Elimination: Goal: Ability to achieve and maintain adequate renal perfusion and functioning will improve Outcome: Progressing

## 2021-11-08 NOTE — Progress Notes (Signed)
12 Days Post-Op Procedure(s) (LRB): CORONARY ARTERY BYPASS GRAFTING (CABG) TIMES FOUR, USING LEFT INTERNAL MAMMARY ARTERY AND RIGHT LEG GREATER SAPHENOUS VEIN HARVESTED ENDOSCOPICALLY AND RIGHT CORONARY THROMBECTOMY (N/A) TRANSESOPHAGEAL ECHOCARDIOGRAM (TEE) (N/A) APPLICATION OF CELL SAVER ENDOVEIN HARVEST OF GREATER SAPHENOUS VEIN (Right) Subjective: Tired after walking  Objective: Vital signs in last 24 hours: Temp:  [98 F (36.7 C)-98.3 F (36.8 C)] 98.3 F (36.8 C) (02/19 0700) Pulse Rate:  [79] 79 (02/19 0800) Cardiac Rhythm: Normal sinus rhythm (02/19 0800) Resp:  [15-22] 20 (02/19 0800) BP: (88-130)/(51-119) 101/78 (02/19 0800) SpO2:  [91 %-97 %] 91 % (02/19 0800) Weight:  [111.8 kg] 111.8 kg (02/19 0700)  Hemodynamic parameters for last 24 hours:    Intake/Output from previous day: 02/18 0701 - 02/19 0700 In: 540 [P.O.:540] Out: 1950 [Urine:1950] Intake/Output this shift: Total I/O In: 120 [P.O.:120] Out: 200 [Urine:200]  General appearance: alert, cooperative, and no distress  Lab Results: Recent Labs    11/06/21 0333 11/08/21 0420  WBC 10.8* 10.0  HGB 8.6* 8.4*  HCT 27.2* 27.0*  PLT 382 463*   BMET:  Recent Labs    11/06/21 0333 11/07/21 0341  NA 132* 132*  K 4.1 4.4  CL 91* 92*  CO2 33* 31  GLUCOSE 105* 91  BUN 18 19  CREATININE 0.82 0.88  CALCIUM 8.4* 8.3*    PT/INR: No results for input(s): LABPROT, INR in the last 72 hours. ABG    Component Value Date/Time   PHART 7.467 (H) 11/02/2021 0347   HCO3 31.1 (H) 11/02/2021 0347   TCO2 32 11/02/2021 0347   ACIDBASEDEF 1.0 10/28/2021 0036   O2SAT 52.1 11/06/2021 0344   CBG (last 3)  Recent Labs    11/07/21 0614 11/07/21 2215 11/08/21 0715  GLUCAP 86 83 105*    Assessment/Plan: S/P Procedure(s) (LRB): CORONARY ARTERY BYPASS GRAFTING (CABG) TIMES FOUR, USING LEFT INTERNAL MAMMARY ARTERY AND RIGHT LEG GREATER SAPHENOUS VEIN HARVESTED ENDOSCOPICALLY AND RIGHT CORONARY THROMBECTOMY  (N/A) TRANSESOPHAGEAL ECHOCARDIOGRAM (TEE) (N/A) APPLICATION OF CELL SAVER ENDOVEIN HARVEST OF GREATER SAPHENOUS VEIN (Right) - Ambulated to desk! Still very deconditioned In SR on amiodarone, metoprolol Anemia stable CBG normal and no SSI ordered- will dc checks   LOS: 13 days    Melrose Nakayama 11/08/2021

## 2021-11-08 NOTE — Progress Notes (Signed)
° °   °  [address]       ,[place] [id number]             [phone]      Ambulated to front desk earlier today  BP 116/65    Pulse 79    Temp (!) 96.9 F (36.1 C) (Oral)    Resp 19    Ht 4\' 11"  (1.499 m)    Wt 111.8 kg    SpO2 95%    BMI 49.78 kg/m   Intake/Output Summary (Last 24 hours) at 11/08/2021 1832 Last data filed at 11/08/2021 1800 Gross per 24 hour  Intake 780 ml  Output 1750 ml  Net -970 ml   Remo Lipps [name], MD [facility] [phone]

## 2021-11-09 LAB — BASIC METABOLIC PANEL
Anion gap: 8 (ref 5–15)
BUN: 19 mg/dL (ref 8–23)
CO2: 30 mmol/L (ref 22–32)
Calcium: 8.3 mg/dL — ABNORMAL LOW (ref 8.9–10.3)
Chloride: 94 mmol/L — ABNORMAL LOW (ref 98–111)
Creatinine, Ser: 0.93 mg/dL (ref 0.44–1.00)
GFR, Estimated: >60 mL/min (ref >60)
Glucose, Bld: 93 mg/dL (ref 70–99)
Potassium: 4.3 mmol/L (ref 3.5–5.1)
Sodium: 132 mmol/L — ABNORMAL LOW (ref 135–145)

## 2021-11-09 LAB — MAGNESIUM: Magnesium: 2 mg/dL (ref 1.7–2.4)

## 2021-11-09 NOTE — Progress Notes (Signed)
Orthopedic Tech Progress Note Patient Details:  Deborah Cooke July 10, 1951 [id number]  RN called requesting a NEW SHOULDER SLING, patient has on arm sling now but when patient was working with THERAPY they suggested what was best for patient.  Ortho Devices Type of Ortho Device: Shoulder immobilizer Ortho Device/Splint Location: RUE Ortho Device/Splint Interventions: Ordered   Post Interventions Patient Tolerated: Well Instructions Provided: Care of device  Janit Pagan 11/09/2021, 10:47 AM

## 2021-11-09 NOTE — Progress Notes (Signed)
13 Days Post-Op Procedure(s) (LRB): CORONARY ARTERY BYPASS GRAFTING (CABG) TIMES FOUR, USING LEFT INTERNAL MAMMARY ARTERY AND RIGHT LEG GREATER SAPHENOUS VEIN HARVESTED ENDOSCOPICALLY AND RIGHT CORONARY THROMBECTOMY (N/A) TRANSESOPHAGEAL ECHOCARDIOGRAM (TEE) (N/A) APPLICATION OF CELL SAVER ENDOVEIN HARVEST OF GREATER SAPHENOUS VEIN (Right) Subjective: Walking in hallway in past 24 hours Will transfer to progressive care NSR Anticipate patient ready for SNF placement in 48 hrs  Objective: Vital signs in last 24 hours: Temp:  [96.9 F (36.1 C)-98.3 F (36.8 C)] 98 F (36.7 C) (02/20 0804) Pulse Rate:  [68-79] 68 (02/20 0600) Cardiac Rhythm: Normal sinus rhythm (02/19 1200) Resp:  [14-19] 18 (02/20 0600) BP: (87-123)/(29-83) 96/53 (02/20 0600) SpO2:  [83 %-96 %] 96 % (02/20 0600) Weight:  [110.5 kg] 110.5 kg (02/20 0300)  Hemodynamic parameters for last 24 hours: CVP:  [11 mmHg] 11 mmHg  Intake/Output from previous day: 02/19 0701 - 02/20 0700 In: 720 [P.O.:720] Out: 1600 [Urine:1600] Intake/Output this shift: No intake/output data recorded.  Surgical incisions clean, dry Will leave  endo vein sutures intact Remove chest tube sutures  and PICC prior to DC  Lab Results: Recent Labs    11/08/21 0420  WBC 10.0  HGB 8.4*  HCT 27.0*  PLT 463*   BMET:  Recent Labs    11/07/21 0341 11/09/21 0502  NA 132* 132*  K 4.4 4.3  CL 92* 94*  CO2 31 30  GLUCOSE 91 93  BUN 19 19  CREATININE 0.88 0.93  CALCIUM 8.3* 8.3*    PT/INR: No results for input(s): LABPROT, INR in the last 72 hours. ABG    Component Value Date/Time   PHART 7.467 (H) 11/02/2021 0347   HCO3 31.1 (H) 11/02/2021 0347   TCO2 32 11/02/2021 0347   ACIDBASEDEF 1.0 10/28/2021 0036   O2SAT 52.1 11/06/2021 0344   CBG (last 3)  Recent Labs    11/07/21 0614 11/07/21 2215 11/08/21 0715  GLUCAP 86 83 105*    Assessment/Plan: S/P Procedure(s) (LRB): CORONARY ARTERY BYPASS GRAFTING (CABG) TIMES FOUR,  USING LEFT INTERNAL MAMMARY ARTERY AND RIGHT LEG GREATER SAPHENOUS VEIN HARVESTED ENDOSCOPICALLY AND RIGHT CORONARY THROMBECTOMY (N/A) TRANSESOPHAGEAL ECHOCARDIOGRAM (TEE) (N/A) APPLICATION OF CELL SAVER ENDOVEIN HARVEST OF GREATER SAPHENOUS VEIN (Right) Remove EPWs and transfer to 4E today Looks ready for SNF in 48 hours   LOS: 14 days    Dahlia Byes 11/09/2021

## 2021-11-09 NOTE — Progress Notes (Signed)
Physical Therapy Treatment Patient Details Name: Deborah Cooke MRN: [id number] DOB: Feb 14, 1951 Today's Date: 11/09/2021   History of Present Illness 71 yo female admitted 2/6 with STEMI and fall at home. Upon ICU admission pt with VT arrest and CPR with ROSC with IABP placed 2/6-2/11. S/p CABGx 4 2/7. PMHx: Rt TSA 10/20/21, breast CA, depression, anxiety, anemia, HTN, thyroid dz, Left femur ORIF    PT Comments    Pt progressing towards her physical therapy goals. Donned new shoulder sling with waist strap for further stability/support. Pt requiring min-mod assist (+2 safety) for functional mobility. Performed transfers to and from bedside commode using quad cane. Pt deferring further ambulation due to headache. Continues with generalized weakness, decreased functional use of right arm, impaired balance and decreased activity tolerance. Continue to recommend SNF for ongoing Physical Therapy.      Recommendations for follow up therapy are one component of a multi-disciplinary discharge planning process, led by the attending physician.  Recommendations may be updated based on patient status, additional functional criteria and insurance authorization.  Follow Up Recommendations  Skilled nursing-short term rehab (<3 hours/day)     Assistance Recommended at Discharge Frequent or constant Supervision/Assistance  Patient can return home with the following A lot of help with walking and/or transfers;A lot of help with bathing/dressing/bathroom;Assistance with cooking/housework;Assist for transportation;Direct supervision/assist for medications management;Help with stairs or ramp for entrance   Equipment Recommendations  BSC/3in1;Hospital bed;Wheelchair (measurements PT);Wheelchair cushion (measurements PT)    Recommendations for Other Services       Precautions / Restrictions Precautions Precautions: Sternal;Fall;Shoulder Type of Shoulder Precautions: no shoulder ROM, AROM elbow and wrist ok until  cleared by MD Shoulder Interventions: Shoulder sling/immobilizer;Off for dressing/bathing/exercises Precaution Booklet Issued: Yes (comment) Precaution Comments: pt educated for sternal and shoulder precautions Required Braces or Orthoses: Sling Restrictions Weight Bearing Restrictions: Yes RUE Weight Bearing: Non weight bearing     Mobility  Bed Mobility Overal bed mobility: Needs Assistance Bed Mobility: Supine to Sit     Supine to sit: Mod assist     General bed mobility comments: Pt progressing BLE's to edge of bed with cueing, assist at trunk to power up to sitting position    Transfers Overall transfer level: Needs assistance Equipment used: Quad cane Transfers: Sit to/from Stand Sit to Stand: Min assist           General transfer comment: Min A for power up    Ambulation/Gait Ambulation/Gait assistance: Min assist, +2 safety/equipment Gait Distance (Feet): 3 Feet Assistive device: Quad cane Gait Pattern/deviations: Step-through pattern, Decreased stride length, Trunk flexed       General Gait Details: Pivotal steps from bed to bedside commode and bedside commode to chair; pt requiring minA for balance (+2 safety)   Stairs             Wheelchair Mobility    Modified Rankin (Stroke Patients Only)       Balance Overall balance assessment: Needs assistance Sitting-balance support: No upper extremity supported, Feet supported Sitting balance-Leahy Scale: Fair     Standing balance support: Single extremity supported, During functional activity Standing balance-Leahy Scale: Poor Standing balance comment: reliant on single UE support                            Cognition Arousal/Alertness: Awake/alert Behavior During Therapy: WFL for tasks assessed/performed Overall Cognitive Status: Impaired/Different from baseline Area of Impairment: Safety/judgement  Memory: Decreased recall of precautions    Safety/Judgement: Decreased awareness of safety   Problem Solving: Slow processing, Requires verbal cues          Exercises      General Comments        Pertinent Vitals/Pain Pain Assessment Pain Assessment: Faces Faces Pain Scale: Hurts even more Pain Location: headache Pain Descriptors / Indicators: Headache Pain Intervention(s): Monitored during session    Home Living                          Prior Function            PT Goals (current goals can now be found in the care plan section) Acute Rehab PT Goals Patient Stated Goal: go to [facility] to Achieve Goals: Fair Progress towards PT goals: Progressing toward goals    Frequency    Min 2X/week      PT Plan Current plan remains appropriate    Co-evaluation              AM-PAC PT "6 Clicks" Mobility   Outcome Measure  Help needed turning from your back to your side while in a flat bed without using bedrails?: A Little Help needed moving from lying on your back to sitting on the side of a flat bed without using bedrails?: A Lot Help needed moving to and from a bed to a chair (including a wheelchair)?: A Little Help needed standing up from a chair using your arms (e.g., wheelchair or bedside chair)?: A Little Help needed to walk in hospital room?: Total Help needed climbing 3-5 steps with a railing? : Total 6 Click Score: 13    End of Session Equipment Utilized During Treatment: Gait belt;Other (comment) (sling) Activity Tolerance: Patient tolerated treatment well Patient left: in chair;with call bell/phone within reach;with chair alarm set Nurse Communication: Mobility status PT Visit Diagnosis: Other abnormalities of gait and mobility (R26.89);Difficulty in walking, not elsewhere classified (R26.2);Muscle weakness (generalized) (M62.81);Unsteadiness on feet (R26.81)     Time: 3151-7616 PT Time Calculation (min) (ACUTE ONLY): 25 min  Charges:  $Therapeutic Activity:  23-37 mins                     Wyona Almas, PT, DPT Acute Rehabilitation Services Pager [phone] Office [phone]    Deno Etienne 11/09/2021, 5:11 PM

## 2021-11-09 NOTE — Progress Notes (Addendum)
Occupational Therapy Treatment Patient Details Name: Deborah Cooke MRN: [id number] DOB: [birth year] Today's Date: 11/09/2021   History of present illness 71 yo female admittd 2/6 with STEMI and fall at home. Upon ICU admission pt with VT arrest and CPR with ROSC with IABP placed 2/6-2/11. S/p CABGx 4 2/7. PMHx: Rt TSA 10/20/21, breast CA, depression, anxiety, anemia, HTN, thyroid dz, Left femur ORIF   OT comments  Upon arrival, pt awake and sitting in recliner. Pt performing functional mobility to bathroom with Min A and EVA walker for stability at LUE; pt requiring cues for NWB at RUE. Pt unable to maintain NWB status at RUE with eva and would benefit from cane trial with PT. Pt requiring Max A for peri care at [facility]; initiating education on compensatory techniques for peri care. Pt would benefit from shoulder immobilizer for positioning of RUE. Continue to recommend dc to SNF and will continue to follow acutely as admitted.    Recommendations for follow up therapy are one component of a multi-disciplinary discharge planning process, led by the attending physician.  Recommendations may be updated based on patient status, additional functional criteria and insurance authorization.    Follow Up Recommendations  Skilled nursing-short term rehab (<3 hours/day)    Assistance Recommended at Discharge Frequent or constant Supervision/Assistance  Patient can return home with the following  Two people to help with walking and/or transfers;Two people to help with bathing/dressing/bathroom;Assistance with cooking/housework   Equipment Recommendations  None recommended by OT    Recommendations for Other Services      Precautions / Restrictions Precautions Precautions: Sternal;Fall;Shoulder Type of Shoulder Precautions: no shoulder ROM, AROM elbow and wrist ok until cleared by MD Shoulder Interventions: Shoulder sling/immobilizer;Off for dressing/bathing/exercises Precaution Booklet Issued: Yes  (comment) Precaution Comments: pt educated for sternal and shoulder precautions Required Braces or Orthoses: Sling Restrictions Weight Bearing Restrictions: Yes RUE Weight Bearing: Non weight bearing Other Position/Activity Restrictions: Sternal precautions       Mobility Bed Mobility Overal bed mobility: Needs Assistance Bed Mobility: Sit to Sidelying, Rolling Rolling: Mod assist       Sit to sidelying: Max assist, +2 for physical assistance General bed mobility comments: Difficulty rolling back into bed and requiring Max A +2 to lower trunk and elevate BLEs.    Transfers Overall transfer level: Needs assistance   Transfers: Sit to/from Stand Sit to Stand: Min assist           General transfer comment: Min A for power up     Balance Overall balance assessment: Needs assistance Sitting-balance support: No upper extremity supported, Feet supported Sitting balance-Leahy Scale: Fair     Standing balance support: Single extremity supported, During functional activity Standing balance-Leahy Scale: Fair                             ADL either performed or assessed with clinical judgement   ADL Overall ADL's : Needs assistance/impaired                 Upper Body Dressing : Maximal assistance;Sitting Upper Body Dressing Details (indicate cue type and reason): Max A for sling management     Toilet Transfer: Minimal assistance;Ambulation;BSC/3in1 (BSC over toilet)   Toileting- Clothing Manipulation and Hygiene: Maximal assistance;Sit to/from stand Toileting - Clothing Manipulation Details (indicate cue type and reason): Max A for peri care while pt maintains standing balance     Functional mobility during ADLs: Minimal assistance (eva -  would not recommend in future) General ADL Comments: Pt performing functional mobility to bathroom with use of eva walker. Pt requiring Min A for walker management and balance - however, pt with weight bearing through  RUE which is against her precautions. Would not recommend use of eva walker or RW with nursing - discussing with RN. Initiating educatio non per icare after BM; will need to practice next session. Min A for power up into standing.    Extremity/Trunk Assessment Upper Extremity Assessment Upper Extremity Assessment: RUE deficits/detail RUE Deficits / Details: TSA on 10/20/21. No shoulder ROM. Cleared for hand, wrist, and elbow   Lower Extremity Assessment Lower Extremity Assessment: Defer to PT evaluation        Vision       Perception     Praxis      Cognition Arousal/Alertness: Awake/alert Behavior During Therapy: WFL for tasks assessed/performed Overall Cognitive Status: Impaired/Different from baseline Area of Impairment: Safety/judgement                         Safety/Judgement: Decreased awareness of safety     General Comments: Decreased awareness of safety to maintain precautions while performing ADLS        Exercises Exercises: General Lower Extremity, General Upper Extremity General Exercises - Upper Extremity Elbow Flexion: AROM, Right, 10 reps, Seated Elbow Extension: AROM, Right, 5 reps, Seated Wrist Flexion: AROM, Right, 10 reps, Seated Wrist Extension: AROM, Right, 10 reps, Seated Digit Composite Flexion: AROM, Right, 10 reps, Seated Composite Extension: AROM, Right, 10 reps, Seated General Exercises - Lower Extremity Long Arc Quad: AROM, Both, 10 reps, Seated Hip Flexion/Marching: AROM, Both, 10 reps, Seated    Shoulder Instructions       General Comments SpO2 96% on RA.    Pertinent Vitals/ Pain       Pain Assessment Pain Assessment: Faces Faces Pain Scale: Hurts little more Pain Location: "my back, and chest, and shoulder" Pain Descriptors / Indicators: Grimacing Pain Intervention(s): Monitored during session, Limited activity within patient's tolerance, Repositioned  Home Living                                           Prior Functioning/Environment              Frequency  Min 2X/week        Progress Toward Goals  OT Goals(current goals can now be found in the care plan section)  Progress towards OT goals: Progressing toward goals  Acute Rehab OT Goals OT Goal Formulation: With patient Time For Goal Achievement: 11/17/21 Potential to Achieve Goals: Good ADL Goals Pt Will Perform Upper Body Dressing: with min assist;sitting Pt Will Perform Lower Body Dressing: with mod assist;sit to/from stand Pt Will Transfer to Toilet: with min assist;ambulating;bedside commode Pt Will Perform Toileting - Clothing Manipulation and hygiene: with mod assist;sit to/from stand;with adaptive equipment Pt/caregiver will Perform Home Exercise Program: Increased ROM;Increased strength;Right Upper extremity;Independently;With written HEP provided Additional ADL Goal #1: Patient/spouse will verbalize and demonstrate as needed understanding of shoulder precaution and prescribed HEP in order to participate in self care routine.  Plan Discharge plan remains appropriate    Co-evaluation                 AM-PAC OT "6 Clicks" Daily Activity     Outcome Measure   Help from another  person eating meals?: A Little Help from another person taking care of personal grooming?: A Little Help from another person toileting, which includes using toliet, bedpan, or urinal?: A Lot Help from another person bathing (including washing, rinsing, drying)?: A Lot Help from another person to put on and taking off regular upper body clothing?: A Lot Help from another person to put on and taking off regular lower body clothing?: A Lot 6 Click Score: 14    End of Session Equipment Utilized During Treatment: Other (comment) (sling)  OT Visit Diagnosis: Unsteadiness on feet (R26.81);Other abnormalities of gait and mobility (R26.89);Muscle weakness (generalized) (M62.81);Pain Pain - Right/Left: Right Pain - part of body:  Shoulder   Activity Tolerance Patient tolerated treatment well   Patient Left in bed;with call bell/phone within reach;with nursing/sitter in room   Nurse Communication Mobility status        Time: 3220-2542 OT Time Calculation (min): 48 min  Charges: OT General Charges $OT Visit: 1 Visit OT Treatments $Self Care/Home Management : 38-52 mins  [place], OTR/L Acute Rehab Pager: [phone] Office: [place] 11/09/2021, 12:40 PM

## 2021-11-09 NOTE — Progress Notes (Signed)
CARDIAC REHAB PHASE I   Pt recently transferred floors. Pt states mobility is slowly getting better but having difficulty with with shoulder and sternal restrictions. Stressed importance of sitting in chair for meals, IS use, and ambulation as able. Pt denies further questions or concerns at this time. Will continue to follow.  [id number] Rufina Falco, RN BSN 11/09/2021 1:46 PM

## 2021-11-09 NOTE — Plan of Care (Signed)
°  Problem: Cardiac: Goal: Will achieve and/or maintain hemodynamic stability Outcome: Progressing   Problem: Respiratory: Goal: Respiratory status will improve Outcome: Progressing   Problem: Activity: Goal: Risk for activity intolerance will decrease Outcome: Progressing   

## 2021-11-10 LAB — BASIC METABOLIC PANEL
Anion gap: 7 (ref 5–15)
BUN: 18 mg/dL (ref 8–23)
CO2: 29 mmol/L (ref 22–32)
Calcium: 8.4 mg/dL — ABNORMAL LOW (ref 8.9–10.3)
Chloride: 96 mmol/L — ABNORMAL LOW (ref 98–111)
Creatinine, Ser: 0.85 mg/dL (ref 0.44–1.00)
GFR, Estimated: >60 mL/min (ref >60)
Glucose, Bld: 93 mg/dL (ref 70–99)
Potassium: 4.5 mmol/L (ref 3.5–5.1)
Sodium: 132 mmol/L — ABNORMAL LOW (ref 135–145)

## 2021-11-10 LAB — SARS CORONAVIRUS 2 (TAT 6-24 HRS): SARS Coronavirus 2: NEGATIVE

## 2021-11-10 MED ORDER — METOPROLOL SUCCINATE ER 25 MG PO TB24
25.0000 mg | ORAL_TABLET | Freq: Every day | ORAL | Status: DC
  Administered 2021-11-11: 25 mg via ORAL
  Filled 2021-11-10: qty 1

## 2021-11-10 MED ORDER — POTASSIUM CHLORIDE CRYS ER 20 MEQ PO TBCR
40.0000 meq | EXTENDED_RELEASE_TABLET | Freq: Every day | ORAL | Status: DC
  Administered 2021-11-10 – 2021-11-11 (×2): 40 meq via ORAL
  Filled 2021-11-10 (×2): qty 2

## 2021-11-10 NOTE — Progress Notes (Signed)
Physical Therapy Treatment Patient Details Name: Deborah Cooke MRN: [id number] DOB: 07-Oct-1950 Today's Date: 11/10/2021   History of Present Illness 71 yo female admitted 2/6 with STEMI and fall at home. Upon ICU admission pt with VT arrest and CPR with ROSC with IABP placed 2/6-2/11. S/p CABGx 4 2/7. PMHx: Rt TSA 10/20/21, breast CA, depression, anxiety, anemia, HTN, thyroid dz, Left femur ORIF    PT Comments    Pt received in chair, agreeable to therapy session and with good participation and fair tolerance for transfer, exercise and gait training. Pt self-limiting gait distance due to c/o headache and RLE pain and fatigue, needs mod sequencing/safety cues for L hemi-walker use and would benefit from chair follow next session for gait progression. Pt needs reminders for RUE precautions and to prevent R shoulder AROM. Adjusted sling and HW height for proper fit. Pt continues to benefit from PT services to progress toward functional mobility goals.   Recommendations for follow up therapy are one component of a multi-disciplinary discharge planning process, led by the attending physician.  Recommendations may be updated based on patient status, additional functional criteria and insurance authorization.  Follow Up Recommendations  Skilled nursing-short term rehab (<3 hours/day)     Assistance Recommended at Discharge Frequent or constant Supervision/Assistance  Patient can return home with the following A lot of help with walking and/or transfers;A lot of help with bathing/dressing/bathroom;Assistance with cooking/housework;Assist for transportation;Direct supervision/assist for medications management;Help with stairs or ramp for entrance   Equipment Recommendations  BSC/3in1;Hospital bed;Wheelchair (measurements PT);Wheelchair cushion (measurements PT) May need hemi-walker   Recommendations for Other Services       Precautions / Restrictions Precautions Precautions:  Sternal;Fall;Shoulder Type of Shoulder Precautions: no shoulder ROM, AROM elbow and wrist ok until cleared by MD Shoulder Interventions: Shoulder sling/immobilizer;Off for dressing/bathing/exercises Precaution Booklet Issued: Yes (comment) Precaution Comments: pt educated for sternal and shoulder precautions Required Braces or Orthoses: Sling Restrictions Weight Bearing Restrictions: Yes RUE Weight Bearing: Non weight bearing Other Position/Activity Restrictions: Sternal precautions     Mobility  Bed Mobility               General bed mobility comments: In recliner upon arrival    Transfers Overall transfer level: Needs assistance Equipment used: Hemi-walker Transfers: Sit to/from Stand Sit to Stand: Min assist           General transfer comment: Min A for lift and steadying    Ambulation/Gait Ambulation/Gait assistance: Min Web designer (Feet): 7 Feet Assistive device: Hemi-walker Gait Pattern/deviations: Step-through pattern, Decreased stride length, Trunk flexed, Wide base of support       General Gait Details: pt self-limiting distance due to fatigue and pain   Stairs             Wheelchair Mobility    Modified Rankin (Stroke Patients Only)       Balance Overall balance assessment: Needs assistance Sitting-balance support: No upper extremity supported, Feet supported Sitting balance-Leahy Scale: Fair     Standing balance support: Single extremity supported, During functional activity Standing balance-Leahy Scale: Poor Standing balance comment: Reliant on single UE support                            Cognition Arousal/Alertness: Awake/alert Behavior During Therapy: WFL for tasks assessed/performed, Anxious Overall Cognitive Status: Within Functional Limits for tasks assessed  General Comments: Increased encouragement required to initiate.        Exercises General  Exercises - Upper Extremity Elbow Flexion: AROM, Right, 10 reps, Seated Elbow Extension: AROM, Right, 5 reps, Seated Wrist Flexion: AROM, Right, Seated, 5 reps Wrist Extension: AROM, Right, Seated, 5 reps General Exercises - Lower Extremity Ankle Circles/Pumps: AROM, Both, 10 reps, Seated Quad Sets: AROM, Both, 5 reps, Seated Long Arc Quad: AROM, Both, 10 reps, Seated Hip ABduction/ADduction: AAROM, Both, 10 reps, Supine Hip Flexion/Marching: AROM, Both, 10 reps, Seated    General Comments General comments (skin integrity, edema, etc.): HR 70's bpm, no dizziness reported      Pertinent Vitals/Pain Pain Assessment Pain Assessment: Faces Pain Score: 7  Pain Location: headache, RLE pain Pain Descriptors / Indicators: Headache, Discomfort Pain Intervention(s): Limited activity within patient's tolerance, Monitored during session, Patient requesting pain meds-RN notified    Home Living                          Prior Function            PT Goals (current goals can now be found in the care plan section) Acute Rehab PT Goals Patient Stated Goal: go to [employer] PT Goal Formulation: With patient Time For Goal Achievement: 11/16/21 Progress towards PT goals: Progressing toward goals    Frequency    Min 2X/week      PT Plan Current plan remains appropriate    Co-evaluation              AM-PAC PT "6 Clicks" Mobility   Outcome Measure  Help needed turning from your back to your side while in a flat bed without using bedrails?: A Little Help needed moving from lying on your back to sitting on the side of a flat bed without using bedrails?: A Lot Help needed moving to and from a bed to a chair (including a wheelchair)?: A Lot (mod cues) Help needed standing up from a chair using your arms (e.g., wheelchair or bedside chair)?: A Lot (mod cues) Help needed to walk in hospital room?: Total Help needed climbing 3-5 steps with a railing? : Total 6 Click Score:  11    End of Session Equipment Utilized During Treatment: Gait belt;Other (comment) (sling) Activity Tolerance: Patient tolerated treatment well;Patient limited by pain Patient left: in chair;with call bell/phone within reach;with chair alarm set;with nursing/sitter in room Nurse Communication: Mobility status;Patient requests pain meds PT Visit Diagnosis: Other abnormalities of gait and mobility (R26.89);Difficulty in walking, not elsewhere classified (R26.2);Muscle weakness (generalized) (M62.81);Unsteadiness on feet (R26.81)     Time: 1657-9038 PT Time Calculation (min) (ACUTE ONLY): 24 min  Charges:  $Gait Training: 8-22 mins $Therapeutic Exercise: 8-22 mins                     Milaya Hora P., PTA Acute Rehabilitation Services Pager: [phone] Office: [place] 11/10/2021, 6:51 PM

## 2021-11-10 NOTE — TOC Progression Note (Signed)
Transition of Care [facility]) - Progression Note    Patient Details  Name: Deborah Cooke MRN: [id number] Date of Birth: 1951/03/11  [facility]) CM/SW [place], [place] Phone Number: 11/10/2021, 3:30 PM  Clinical Narrative:     Received insurance [employer] reference # [id number] form 02/21-02/23 Informed [facility] [place]/SNF of anticipated d/c tomorrow  Covid  test requested   TOC will continue to follow and assist with discharge planning.   Thurmond Butts, MSW, LCSW Clinical Social Worker     Expected Discharge Plan: Skilled Nursing Facility Barriers to Discharge: Continued Medical Work up  Expected Discharge Plan and Services Expected Discharge Plan: [place] In-house Referral: Clinical Social Work     Living arrangements for the past 2 months: Single Family Home                                       Social Determinants of Health (SDOH) Interventions    Readmission Risk Interventions No flowsheet data found.

## 2021-11-10 NOTE — Progress Notes (Addendum)
[address]       [place] [address]             [phone]    14 Days Post-Op Procedure(s) (LRB): CORONARY ARTERY BYPASS GRAFTING (CABG) TIMES FOUR, USING LEFT INTERNAL MAMMARY ARTERY AND RIGHT LEG GREATER SAPHENOUS VEIN HARVESTED ENDOSCOPICALLY AND RIGHT CORONARY THROMBECTOMY (N/A) TRANSESOPHAGEAL ECHOCARDIOGRAM (TEE) (N/A) APPLICATION OF CELL SAVER ENDOVEIN HARVEST OF GREATER SAPHENOUS VEIN (Right) Subjective:  Up in the chair. No new concerns.  Progressing with mobility.  Objective: Vital signs in last 24 hours: Temp:  [98.1 F (36.7 C)-100 F (37.8 C)] 98.1 F (36.7 C) (02/20 2355) Pulse Rate:  [70-81] 70 (02/20 2355) Cardiac Rhythm: Normal sinus rhythm;Bundle branch block (02/20 2011) Resp:  [16-20] 18 (02/20 2355) BP: (113-153)/(51-79) 115/58 (02/20 2355) SpO2:  [92 %-98 %] 98 % (02/20 2355)     Intake/Output from previous day: 02/20 0701 - 02/21 0700 In: 240 [P.O.:240] Out: -  Intake/Output this shift: No intake/output data recorded.  General:appears stronger each day. No distress.  Heart: stable SR. Chest: breath sounds are clear, O2 sats acceptable on RA.  ABD: no tenderness.  Exts: the RLE EVH incisions are healing with nylon sutures in place.  Neuro: intact. Remove chest tube sutures  and PICC prior to DC  Lab Results: Recent Labs    11/08/21 0420  WBC 10.0  HGB 8.4*  HCT 27.0*  PLT 463*    BMET:  Recent Labs    11/09/21 0502 11/10/21 0449  NA 132* 132*  K 4.3 4.5  CL 94* 96*  CO2 30 29  GLUCOSE 93 93  BUN 19 18  CREATININE 0.93 0.85  CALCIUM 8.3* 8.4*     PT/INR: No results for input(s): LABPROT, INR in the last 72 hours. ABG    Component Value Date/Time   PHART 7.467 (H) 11/02/2021 0347   HCO3 31.1 (H) 11/02/2021 0347   TCO2 32 11/02/2021 0347   ACIDBASEDEF 1.0 10/28/2021 0036   O2SAT 52.1 11/06/2021 0344   CBG (last 3)  Recent Labs    11/07/21 2215 11/08/21 0715  GLUCAP 83 105*      Assessment/Plan: S/P Procedure(s) (LRB): CORONARY ARTERY BYPASS GRAFTING (CABG) TIMES FOUR, USING LEFT INTERNAL MAMMARY ARTERY AND RIGHT LEG GREATER SAPHENOUS VEIN HARVESTED ENDOSCOPICALLY AND RIGHT CORONARY THROMBECTOMY (N/A) TRANSESOPHAGEAL ECHOCARDIOGRAM (TEE) (N/A) APPLICATION OF CELL SAVER ENDOVEIN HARVEST OF GREATER SAPHENOUS VEIN (Right)  -POD14 emergency CABG after presenting with STEMI. VS and cardiac rhythm are stable. On ASA, statin, BB and Plavix.   -Volume Excess- she has responded well to diuresis. Now equal to pre-op Wt if accurate. On daily oral Lasix  - Expected acute blood loss anemia- Hct stable. On Fe++ supplement.   -Post-op A-fib- Maintaining SR on oral amiodarone 200mg  BID. She has been fully loaded and in stable SR for several days, should be able to decrease the amio to 200mg  daily at discharge.  -Disposition- Ms. Rosemeyer has accepted a bed at [facility]. Insurance approval is pending.Anticipate she will be ready for transfer tomorrow. COVID test ordered.    LOS: 15 days    Antony Ode[place], [place] [phone] 11/10/2021   Patient examined and reviewed with MR PA-C. Agree with above assessment and plan. Maintaining sinus rhythm.  Diuresis to her dry weight.  Surgical incisions healing well.  Pacing wires and chest tube sutures have been removed. We will leave skin sutures and right leg vein harvest and remove later at  her postop office visit.  patient examined and medical record reviewed,agree with above note. Dahlia Byes 11/10/2021

## 2021-11-10 NOTE — Progress Notes (Signed)
Occupational Therapy Treatment Patient Details Name: Deborah Cooke MRN: [id number] DOB: 06/14/1951 Today's Date: 11/10/2021   History of present illness 71 yo female admitted 2/6 with STEMI and fall at home. Upon ICU admission pt with VT arrest and CPR with ROSC with IABP placed 2/6-2/11. S/p CABGx 4 2/7. PMHx: Rt TSA 10/20/21, breast CA, depression, anxiety, anemia, HTN, thyroid dz, Left femur ORIF   OT comments  Pt progressing towards established OT goals. Continues to present with decreased balance, ROM, and strength impacting her safe performance of ADLs. Pt performing functional mobility in room from recliner to [facility] with Min Guard-Min A and use of hemi walker at LUE. Pt performing anterior peri care with Min A for standing balance. Pt participating in RUE ROM exercises (hand, wrist, elbow) and BLEs AROM exercises. VSS throughout on RA. Continue to recommend dc to SNF and will continue to follow acutely as admitted.    Recommendations for follow up therapy are one component of a multi-disciplinary discharge planning process, led by the attending physician.  Recommendations may be updated based on patient status, additional functional criteria and insurance authorization.    Follow Up Recommendations  Skilled nursing-short term rehab (<3 hours/day)    Assistance Recommended at Discharge Frequent or constant Supervision/Assistance  Patient can return home with the following  Two people to help with walking and/or transfers;Two people to help with bathing/dressing/bathroom;Assistance with cooking/housework   Equipment Recommendations  None recommended by OT    Recommendations for Other Services      Precautions / Restrictions Precautions Precautions: Sternal;Fall;Shoulder Type of Shoulder Precautions: no shoulder ROM, AROM elbow and wrist ok until cleared by MD Shoulder Interventions: Shoulder sling/immobilizer;Off for dressing/bathing/exercises Precaution Booklet Issued: Yes  (comment) Precaution Comments: pt educated for sternal and shoulder precautions Required Braces or Orthoses: Sling Restrictions Weight Bearing Restrictions: Yes RUE Weight Bearing: Non weight bearing Other Position/Activity Restrictions: Sternal precautions       Mobility Bed Mobility               General bed mobility comments: In recliner upon arrival    Transfers Overall transfer level: Needs assistance Equipment used: Hemi-walker Transfers: Sit to/from Stand Sit to Stand: Min assist, Min guard           General transfer comment: Min A for first sit<>stand to power up from recliner. Min Guard A from [facility]     Balance Overall balance assessment: Needs assistance Sitting-balance support: No upper extremity supported, Feet supported Sitting balance-Leahy Scale: Fair     Standing balance support: Single extremity supported, During functional activity Standing balance-Leahy Scale: Poor Standing balance comment: reliant on single UE support                           ADL either performed or assessed with clinical judgement   ADL Overall ADL's : Needs assistance/impaired     Grooming: Supervision/safety;Wash/dry hands           Upper Body Dressing : Maximal assistance;Sitting Upper Body Dressing Details (indicate cue type and reason): Max A for sling management Lower Body Dressing: Minimal assistance;Maximal assistance;With adaptive equipment Lower Body Dressing Details (indicate cue type and reason): Using dressing stick to doff socks. Pt reporting she uses a sock aid to don socks at home - however, defering due to HA and fatigue. Toilet Transfer: Minimal assistance;Min guard;Ambulation;BSC/3in1 (hemi walker) Toilet Transfer Details (indicate cue type and reason): Min A for first sit<>stand to power up  from recliner. Min Guard A from [place] and Hygiene: Minimal assistance;Sit to/from stand Toileting - Clothing  Manipulation Details (indicate cue type and reason): Min A for balance while standing as pt perform anterior peri care after urination     Functional mobility during ADLs: Minimal assistance;Min guard (hemi walker cane) General ADL Comments: Pt performing functional mobility to BSC (~5 ft) and then completing toileting. Walking back to recliner (26ft). Use of hemi walker    Extremity/Trunk Assessment Upper Extremity Assessment Upper Extremity Assessment: RUE deficits/detail RUE Deficits / Details: TSA on 10/20/21. No shoulder ROM. Cleared for hand, wrist, and elbow   Lower Extremity Assessment Lower Extremity Assessment: Defer to PT evaluation        Vision       Perception     Praxis      Cognition Arousal/Alertness: Awake/alert Behavior During Therapy: WFL for tasks assessed/performed, Anxious Overall Cognitive Status: Within Functional Limits for tasks assessed                                 General Comments: Increased encouragement required        Exercises Exercises: General Lower Extremity, General Upper Extremity General Exercises - Upper Extremity Elbow Flexion: AROM, Right, 10 reps, Seated Elbow Extension: AROM, Right, 5 reps, Seated Wrist Flexion: AROM, Right, 10 reps, Seated Wrist Extension: AROM, Right, 10 reps, Seated Digit Composite Flexion: AROM, Right, 10 reps, Seated Composite Extension: AROM, Right, 10 reps, Seated General Exercises - Lower Extremity Short Arc Quad: AROM, Both, Seated, 10 reps Long Arc Quad: AROM, Both, 10 reps, Seated Hip Flexion/Marching: AROM, Both, 10 reps, Seated    Shoulder Instructions       General Comments VSS on RA    Pertinent Vitals/ Pain       Pain Assessment Pain Assessment: Faces Faces Pain Scale: Hurts even more Pain Location: headache Pain Descriptors / Indicators: Headache Pain Intervention(s): Monitored during session, Limited activity within patient's tolerance, Repositioned  Home Living                                           Prior Functioning/Environment              Frequency  Min 2X/week        Progress Toward Goals  OT Goals(current goals can now be found in the care plan section)  Progress towards OT goals: Progressing toward goals  Acute Rehab OT Goals OT Goal Formulation: With patient Time For Goal Achievement: 11/17/21 Potential to Achieve Goals: Good ADL Goals Pt Will Perform Upper Body Dressing: with min assist;sitting Pt Will Perform Lower Body Dressing: with mod assist;sit to/from stand Pt Will Transfer to Toilet: with min assist;ambulating;bedside commode Pt Will Perform Toileting - Clothing Manipulation and hygiene: with mod assist;sit to/from stand;with adaptive equipment Pt/caregiver will Perform Home Exercise Program: Increased ROM;Increased strength;Right Upper extremity;Independently;With written HEP provided Additional ADL Goal #1: Patient/spouse will verbalize and demonstrate as needed understanding of shoulder precaution and prescribed HEP in order to participate in self care routine.  Plan Discharge plan remains appropriate    Co-evaluation                 AM-PAC OT "6 Clicks" Daily Activity     Outcome Measure   Help from another person eating meals?: A  Little Help from another person taking care of personal grooming?: A Little Help from another person toileting, which includes using toliet, bedpan, or urinal?: A Lot Help from another person bathing (including washing, rinsing, drying)?: A Lot Help from another person to put on and taking off regular upper body clothing?: A Lot Help from another person to put on and taking off regular lower body clothing?: A Lot 6 Click Score: 14    End of Session Equipment Utilized During Treatment: Other (comment) (sling)  OT Visit Diagnosis: Unsteadiness on feet (R26.81);Other abnormalities of gait and mobility (R26.89);Muscle weakness (generalized)  (M62.81);Pain Pain - Right/Left: Right Pain - part of body: Shoulder   Activity Tolerance Patient tolerated treatment well   Patient Left with call bell/phone within reach;in chair;with chair alarm set   Nurse Communication Mobility status        Time: 2353-6144 OT Time Calculation (min): 30 min  Charges: OT General Charges $OT Visit: 1 Visit OT Treatments $Self Care/Home Management : 23-37 mins  [place], OTR/L Acute Rehab Pager: [phone] Office: [place] 11/10/2021, 12:35 PM

## 2021-11-10 NOTE — Care Management Important Message (Signed)
Important Message  Patient Details  Name: Deborah Cooke MRN: [id number] Date of Birth: October 27, 1950   Medicare Important Message Given:  Yes     [name] 11/10/2021, 8:52 AM

## 2021-11-10 NOTE — Progress Notes (Signed)
Nutrition Follow-up  DOCUMENTATION CODES:   Morbid obesity  INTERVENTION:   Heart healthy diet education provided.  Continue MVI with minerals BID and calcium carbonate 500 mg TID (hx bariatric surgery).  NUTRITION DIAGNOSIS:   Inadequate oral intake related to acute illness as evidenced by NPO status.  Ongoing   GOAL:   Patient will meet greater than or equal to 90% of their needs  Progressing   MONITOR:   Vent status, Labs, Weight trends  REASON FOR ASSESSMENT:   Ventilator    ASSESSMENT:   71 yo female admitted with acute STEMI requiring IABP and emergent CABG. PMH includes morbid obesity, HTN, gastric bypass 2003, GERD, depression, hx of breast cancer  Patient reports good intake of meals. She would like information on what she should eat when she is discharged. Plans for discharge to SNF soon.   RD provided "Heart Healthy Nutrition Therapy" handout from the Academy of [employer]. Reviewed patient's dietary recall. Provided examples on ways to decrease sodium and fat intake in diet. Discouraged intake of processed foods and use of salt shaker. Encouraged fresh fruits and vegetables as well as whole grain sources of carbohydrates to maximize fiber intake. Teach back method used. Expect good compliance.  Patient currently on a heart healthy diet. Meal intakes: 50-100%  Current intake is meeting nutrition needs, no supplements needed at this time.   Labs reviewed. Na 132  Medications reviewed and include TUMS, ferrous sulfate, Lasix, MVI with minerals BID (hx GBS), Protonix, potassium chloride, Senokot-S.  Weights reviewed.  Currently 110.5 kg. Trending down towards admission weight of 109 kg with negative I/O.  Diet Order:   Diet Order             Diet Heart Room service appropriate? Yes; Fluid consistency: Thin  Diet effective now                   EDUCATION NEEDS:   Not appropriate for education at this time  Skin:  Skin  Assessment: Reviewed RN Assessment  Last BM:  2/21 type 7  Height:   Ht Readings from Last 1 Encounters:  10/27/21 4\' 11"  (1.499 m)    Weight:   Wt Readings from Last 1 Encounters:  11/09/21 110.5 kg    BMI:  Body mass index is 49.2 kg/m.  Estimated Nutritional Needs:   Kcal:  1700-1900 kcals  Protein:  80-100  Fluid:  >= 1.5 L    Lucas Mallow RD, LDN, CNSC Please refer to Amion for contact information.

## 2021-11-11 ENCOUNTER — Telehealth (HOSPITAL_COMMUNITY): Payer: Self-pay

## 2021-11-11 MED ORDER — OXYCODONE HCL 10 MG PO TABS: 10.0000 mg | ORAL_TABLET | ORAL | 0 refills | Status: AC | PRN

## 2021-11-11 MED ORDER — POTASSIUM CHLORIDE CRYS ER 20 MEQ PO TBCR: 20.0000 meq | EXTENDED_RELEASE_TABLET | Freq: Every day | ORAL | Status: DC

## 2021-11-11 MED ORDER — ACETAMINOPHEN 325 MG PO TABS: 650.0000 mg | ORAL_TABLET | Freq: Four times a day (QID) | ORAL | Status: DC | PRN

## 2021-11-11 MED ORDER — AMIODARONE HCL 200 MG PO TABS: 200.0000 mg | ORAL_TABLET | Freq: Every day | ORAL | Status: DC

## 2021-11-11 MED ORDER — GABAPENTIN 100 MG PO CAPS: 200.0000 mg | ORAL_CAPSULE | Freq: Three times a day (TID) | ORAL | Status: DC

## 2021-11-11 MED ORDER — ASPIRIN 81 MG PO TBEC: 81.0000 mg | DELAYED_RELEASE_TABLET | Freq: Every day | ORAL | 11 refills | Status: DC

## 2021-11-11 MED ORDER — PANTOPRAZOLE SODIUM 40 MG PO TBEC: 40.0000 mg | DELAYED_RELEASE_TABLET | Freq: Every day | ORAL | Status: DC

## 2021-11-11 MED ORDER — CLOPIDOGREL BISULFATE 75 MG PO TABS: 75.0000 mg | ORAL_TABLET | Freq: Every day | ORAL | Status: DC

## 2021-11-11 MED ORDER — METOPROLOL SUCCINATE ER 25 MG PO TB24: 25.0000 mg | ORAL_TABLET | Freq: Every day | ORAL | Status: DC

## 2021-11-11 MED ORDER — FUROSEMIDE 40 MG PO TABS: 40.0000 mg | ORAL_TABLET | Freq: Every day | ORAL | Status: DC

## 2021-11-11 MED ORDER — ROSUVASTATIN CALCIUM 10 MG PO TABS
10.0000 mg | ORAL_TABLET | Freq: Every day | ORAL | Status: DC
Start: 2021-11-11 — End: 2021-12-15

## 2021-11-11 MED ORDER — QUETIAPINE FUMARATE 100 MG PO TABS: 200.0000 mg | ORAL_TABLET | Freq: Every day | ORAL | Status: DC

## 2021-11-11 NOTE — Progress Notes (Addendum)
° °   °  [address]       [place] [address]             [phone]    15 Days Post-Op Procedure(s) (LRB): CORONARY ARTERY BYPASS GRAFTING (CABG) TIMES FOUR, USING LEFT INTERNAL MAMMARY ARTERY AND RIGHT LEG GREATER SAPHENOUS VEIN HARVESTED ENDOSCOPICALLY AND RIGHT CORONARY THROMBECTOMY (N/A) TRANSESOPHAGEAL ECHOCARDIOGRAM (TEE) (N/A) APPLICATION OF CELL SAVER ENDOVEIN HARVEST OF GREATER SAPHENOUS VEIN (Right) Subjective:  Resting quietly in bed, awakens easily.  Feels well, has no hew concerns and feels she is ready to transition to SNF for further rehab. Pain control better with increase in oxycodone.   Stools x 3 yesterday, no dysuria.   Objective: Vital signs in last 24 hours: Temp:  [97.8 F (36.6 C)-100.2 F (37.9 C)] 98.1 F (36.7 C) (02/22 0332) Pulse Rate:  [69-76] 75 (02/22 0332) Cardiac Rhythm: Normal sinus rhythm (02/21 2021) Resp:  [16-20] 17 (02/22 0332) BP: (113-145)/(62-126) 145/126 (02/22 0332) SpO2:  [93 %-100 %] 95 % (02/22 0332) Weight:  [112.9 kg] 112.9 kg (02/22 0517)     Intake/Output from previous day: 02/21 0701 - 02/22 0700 In: 240 [P.O.:240] Out: 200 [Urine:200] Intake/Output this shift: No intake/output data recorded.  General:appears stronger each day. No distress.  Heart: stable SR. Chest: breath sounds are clear, O2 sats acceptable on RA.  ABD: no tenderness.  Exts: the RLE EVH incisions are healing. Will leave nylon sutures in place and remove next week in the office.  Neuro: intact.   Lab Results: No results for input(s): WBC, HGB, HCT, PLT in the last 72 hours.  BMET:  Recent Labs    11/09/21 0502 11/10/21 0449  NA 132* 132*  K 4.3 4.5  CL 94* 96*  CO2 30 29  GLUCOSE 93 93  BUN 19 18  CREATININE 0.93 0.85  CALCIUM 8.3* 8.4*     PT/INR: No results for input(s): LABPROT, INR in the last 72 hours. ABG    Component Value Date/Time   PHART 7.467 (H) 11/02/2021 0347   HCO3 31.1 (H) 11/02/2021 0347    TCO2 32 11/02/2021 0347   ACIDBASEDEF 1.0 10/28/2021 0036   O2SAT 52.1 11/06/2021 0344   CBG (last 3)  No results for input(s): GLUCAP in the last 72 hours.   Assessment/Plan: S/P Procedure(s) (LRB): CORONARY ARTERY BYPASS GRAFTING (CABG) TIMES FOUR, USING LEFT INTERNAL MAMMARY ARTERY AND RIGHT LEG GREATER SAPHENOUS VEIN HARVESTED ENDOSCOPICALLY AND RIGHT CORONARY THROMBECTOMY (N/A) TRANSESOPHAGEAL ECHOCARDIOGRAM (TEE) (N/A) APPLICATION OF CELL SAVER ENDOVEIN HARVEST OF GREATER SAPHENOUS VEIN (Right)  -POD15 emergency CABG after presenting with STEMI. VS and cardiac rhythm are stable. On ASA, statin, BB and Plavix.   -Volume Excess- Now equal to pre-op Wt. On daily oral Lasix  - Expected acute blood loss anemia- Hct stable. Continue Fe++ supplement.   -Post-op A-fib- Maintaining SR on oral amiodarone. Plan to decrease the dose to 200mg  daily.  -Disposition- Ms. Leamer has accepted a bed at [facility]. Insurance approval has been secured. Plan transfer today.  D/C the PICC this AM.     LOS: 16 days    Antony Odea, PA-C [phone] 11/11/2021   DC instructions reviewed with the patient   She will ned to remain on Plavix indefinitely  for segment of cryovein used for bypass conduit.  patient examined and medical record reviewed,agree with above note. Dahlia Byes 11/11/2021

## 2021-11-11 NOTE — TOC Transition Note (Signed)
Transition of Care [facility]) - CM/SW Discharge Note   Patient Details  Name: Deborah Cooke MRN: [id number] Date of Birth: [birth year]  [facility] of Care [facility]) CM/SW Contact:  Vinie Sill, LCSW Phone Number: 11/11/2021, 12:37 PM   Clinical Narrative:     Patient will Discharge to: [place] Discharge Date: 11/11/2021 Family Notified: spouse informed by SNF Transport By: Corey Harold  Per MD patient is ready for discharge. RN, patient, and facility notified of discharge. Discharge Summary sent to facility. RN given number for [phone], Room 108. Ambulance transport requested for patient.   Clinical Social Worker signing off.  Thurmond Butts, MSW, LCSW Clinical Social Worker     Final next level of care: Skilled Nursing Facility Barriers to Discharge: Barriers Resolved   Patient Goals and CMS Choice   CMS Medicare.gov Compare Post Acute Care list provided to:: Patient Represenative (must comment) (Patients spouse Deborah Cooke) Choice offered to / list presented to : Spouse (pateints spouse Deborah Cooke)  Discharge Placement              Patient chooses bed at: [facility] Patient to be transferred to facility by: [facility]   Patient and family notified of of transfer: 11/11/21  Discharge Plan and Services In-house Referral: Clinical Social Work                                   Social Determinants of Health (SDOH) Interventions     Readmission Risk Interventions No flowsheet data found.

## 2021-11-11 NOTE — Progress Notes (Signed)
Called report to [employer], Therapist, sports at [employer].   Lavenia Atlas, RN

## 2021-11-11 NOTE — Telephone Encounter (Signed)
Per phase II cardiac rehab, fax cardiac rehab referral to [facility] cardiac rehab.

## 2021-11-11 NOTE — Progress Notes (Signed)
CARDIAC REHAB PHASE I   D/c education completed with pt. Pt educated on importance of site care and monitoring incisions daily. Encouraged continued IS use and sternal precautions. Pt given in-the-tube sheet and heart healthy diet. Reviewed restrictions, encouraged mobility with emphasis on safety. Will refer to CRP II [place] with knowledge pt needs to complete rehab first.  [phone] Rufina Falco, RN BSN 11/11/2021 9:37 AM

## 2021-11-11 NOTE — Progress Notes (Signed)
IVT to d/c PICC per order.  Pt in chair upon arrival.  RN to move to bed.  IVT will return in approximately 76mins

## 2021-11-26 ENCOUNTER — Observation Stay (HOSPITAL_COMMUNITY): Payer: Medicare Other

## 2021-11-26 ENCOUNTER — Other Ambulatory Visit: Payer: Self-pay

## 2021-11-26 ENCOUNTER — Encounter (HOSPITAL_COMMUNITY): Payer: Self-pay | Admitting: Emergency Medicine

## 2021-11-26 ENCOUNTER — Observation Stay (HOSPITAL_BASED_OUTPATIENT_CLINIC_OR_DEPARTMENT_OTHER): Payer: Medicare Other

## 2021-11-26 ENCOUNTER — Emergency Department (HOSPITAL_COMMUNITY): Payer: Medicare Other

## 2021-11-26 ENCOUNTER — Inpatient Hospital Stay (HOSPITAL_COMMUNITY)
Admission: EM | Admit: 2021-11-26 | Discharge: 2021-12-01 | DRG: 175 | Disposition: A | Discharge status: Skilled Nursing Facility | Payer: Medicare Other | Attending: Internal Medicine | Admitting: Internal Medicine

## 2021-11-26 DIAGNOSIS — R601 Generalized edema: Secondary | ICD-10-CM

## 2021-11-26 DIAGNOSIS — M1990 Unspecified osteoarthritis, unspecified site: Secondary | ICD-10-CM | POA: Diagnosis present

## 2021-11-26 DIAGNOSIS — Z6841 Body Mass Index (BMI) 40.0 and over, adult: Secondary | ICD-10-CM

## 2021-11-26 DIAGNOSIS — R296 Repeated falls: Secondary | ICD-10-CM | POA: Diagnosis present

## 2021-11-26 DIAGNOSIS — I2609 Other pulmonary embolism with acute cor pulmonale: Secondary | ICD-10-CM | POA: Diagnosis not present

## 2021-11-26 DIAGNOSIS — Z96653 Presence of artificial knee joint, bilateral: Secondary | ICD-10-CM | POA: Diagnosis present

## 2021-11-26 DIAGNOSIS — Z79899 Other long term (current) drug therapy: Secondary | ICD-10-CM

## 2021-11-26 DIAGNOSIS — E039 Hypothyroidism, unspecified: Secondary | ICD-10-CM

## 2021-11-26 DIAGNOSIS — Z96611 Presence of right artificial shoulder joint: Secondary | ICD-10-CM

## 2021-11-26 DIAGNOSIS — M810 Age-related osteoporosis without current pathological fracture: Secondary | ICD-10-CM | POA: Diagnosis present

## 2021-11-26 DIAGNOSIS — I252 Old myocardial infarction: Secondary | ICD-10-CM

## 2021-11-26 DIAGNOSIS — I2699 Other pulmonary embolism without acute cor pulmonale: Secondary | ICD-10-CM

## 2021-11-26 DIAGNOSIS — Z8701 Personal history of pneumonia (recurrent): Secondary | ICD-10-CM

## 2021-11-26 DIAGNOSIS — I959 Hypotension, unspecified: Secondary | ICD-10-CM | POA: Diagnosis not present

## 2021-11-26 DIAGNOSIS — Z888 Allergy status to other drugs, medicaments and biological substances status: Secondary | ICD-10-CM

## 2021-11-26 DIAGNOSIS — Z86711 Personal history of pulmonary embolism: Secondary | ICD-10-CM | POA: Diagnosis present

## 2021-11-26 DIAGNOSIS — G8929 Other chronic pain: Secondary | ICD-10-CM | POA: Diagnosis present

## 2021-11-26 DIAGNOSIS — I110 Hypertensive heart disease with heart failure: Secondary | ICD-10-CM | POA: Diagnosis present

## 2021-11-26 DIAGNOSIS — Z9884 Bariatric surgery status: Secondary | ICD-10-CM

## 2021-11-26 DIAGNOSIS — Z951 Presence of aortocoronary bypass graft: Secondary | ICD-10-CM | POA: Diagnosis not present

## 2021-11-26 DIAGNOSIS — R531 Weakness: Secondary | ICD-10-CM

## 2021-11-26 DIAGNOSIS — Z87891 Personal history of nicotine dependence: Secondary | ICD-10-CM

## 2021-11-26 DIAGNOSIS — I480 Paroxysmal atrial fibrillation: Secondary | ICD-10-CM | POA: Diagnosis present

## 2021-11-26 DIAGNOSIS — I10 Essential (primary) hypertension: Secondary | ICD-10-CM | POA: Diagnosis present

## 2021-11-26 DIAGNOSIS — I50810 Right heart failure, unspecified: Secondary | ICD-10-CM | POA: Diagnosis present

## 2021-11-26 DIAGNOSIS — Z7902 Long term (current) use of antithrombotics/antiplatelets: Secondary | ICD-10-CM

## 2021-11-26 DIAGNOSIS — Z79811 Long term (current) use of aromatase inhibitors: Secondary | ICD-10-CM

## 2021-11-26 DIAGNOSIS — G629 Polyneuropathy, unspecified: Secondary | ICD-10-CM | POA: Diagnosis present

## 2021-11-26 DIAGNOSIS — Z981 Arthrodesis status: Secondary | ICD-10-CM

## 2021-11-26 DIAGNOSIS — N179 Acute kidney failure, unspecified: Secondary | ICD-10-CM

## 2021-11-26 DIAGNOSIS — Z7982 Long term (current) use of aspirin: Secondary | ICD-10-CM

## 2021-11-26 DIAGNOSIS — Z170 Estrogen receptor positive status [ER+]: Secondary | ICD-10-CM

## 2021-11-26 DIAGNOSIS — Z79890 Hormone replacement therapy (postmenopausal): Secondary | ICD-10-CM

## 2021-11-26 DIAGNOSIS — Z20822 Contact with and (suspected) exposure to covid-19: Secondary | ICD-10-CM | POA: Diagnosis present

## 2021-11-26 DIAGNOSIS — Z803 Family history of malignant neoplasm of breast: Secondary | ICD-10-CM

## 2021-11-26 DIAGNOSIS — C50412 Malignant neoplasm of upper-outer quadrant of left female breast: Secondary | ICD-10-CM | POA: Diagnosis present

## 2021-11-26 DIAGNOSIS — D649 Anemia, unspecified: Secondary | ICD-10-CM | POA: Diagnosis present

## 2021-11-26 DIAGNOSIS — R251 Tremor, unspecified: Secondary | ICD-10-CM | POA: Diagnosis not present

## 2021-11-26 DIAGNOSIS — G894 Chronic pain syndrome: Secondary | ICD-10-CM | POA: Diagnosis present

## 2021-11-26 DIAGNOSIS — G253 Myoclonus: Secondary | ICD-10-CM

## 2021-11-26 DIAGNOSIS — E785 Hyperlipidemia, unspecified: Secondary | ICD-10-CM | POA: Diagnosis present

## 2021-11-26 DIAGNOSIS — I2510 Atherosclerotic heart disease of native coronary artery without angina pectoris: Secondary | ICD-10-CM | POA: Diagnosis present

## 2021-11-26 LAB — BASIC METABOLIC PANEL
Anion gap: 13 (ref 5–15)
BUN: 27 mg/dL — ABNORMAL HIGH (ref 8–23)
CO2: 27 mmol/L (ref 22–32)
Calcium: 8.8 mg/dL — ABNORMAL LOW (ref 8.9–10.3)
Chloride: 96 mmol/L — ABNORMAL LOW (ref 98–111)
Creatinine, Ser: 1.17 mg/dL — ABNORMAL HIGH (ref 0.44–1.00)
GFR, Estimated: 49 mL/min — ABNORMAL LOW (ref >60)
Glucose, Bld: 92 mg/dL (ref 70–99)
Potassium: 3.8 mmol/L (ref 3.5–5.1)
Sodium: 136 mmol/L (ref 135–145)

## 2021-11-26 LAB — RESP PANEL BY RT-PCR (FLU A&B, COVID) ARPGX2
Influenza A by PCR: NEGATIVE
Influenza B by PCR: NEGATIVE
SARS Coronavirus 2 by RT PCR: NEGATIVE

## 2021-11-26 LAB — TROPONIN I (HIGH SENSITIVITY)
Troponin I (High Sensitivity): 54 ng/L — ABNORMAL HIGH (ref <18)
Troponin I (High Sensitivity): 50 ng/L — ABNORMAL HIGH (ref <18)

## 2021-11-26 LAB — BLOOD GAS, ARTERIAL
Acid-Base Excess: 6.9 mmol/L — ABNORMAL HIGH (ref 0.0–2.0)
Bicarbonate: 32 mmol/L — ABNORMAL HIGH (ref 20.0–28.0)
Drawn by: 55002
O2 Saturation: 96.3 %
Patient temperature: 37.4
pCO2 arterial: 47 mmHg (ref 32–48)
pH, Arterial: 7.44 (ref 7.35–7.45)
pO2, Arterial: 91 mmHg (ref 83–108)

## 2021-11-26 LAB — CBC WITH DIFFERENTIAL/PLATELET
Abs Immature Granulocytes: 0.04 10*3/uL (ref 0.00–0.07)
Basophils Absolute: 0.1 10*3/uL (ref 0.0–0.1)
Basophils Relative: 1 %
Eosinophils Absolute: 0.2 10*3/uL (ref 0.0–0.5)
Eosinophils Relative: 3 %
HCT: 30.8 % — ABNORMAL LOW (ref 36.0–46.0)
Hemoglobin: 9.7 g/dL — ABNORMAL LOW (ref 12.0–15.0)
Immature Granulocytes: 1 %
Lymphocytes Relative: 15 %
Lymphs Abs: 1.1 10*3/uL (ref 0.7–4.0)
MCH: 31.2 pg (ref 26.0–34.0)
MCHC: 31.5 g/dL (ref 30.0–36.0)
MCV: 99 fL (ref 80.0–100.0)
Monocytes Absolute: 0.7 10*3/uL (ref 0.1–1.0)
Monocytes Relative: 9 %
Neutro Abs: 5.2 10*3/uL (ref 1.7–7.7)
Neutrophils Relative %: 71 %
Platelets: 373 10*3/uL (ref 150–400)
RBC: 3.11 MIL/uL — ABNORMAL LOW (ref 3.87–5.11)
RDW: 16.2 % — ABNORMAL HIGH (ref 11.5–15.5)
WBC: 7.3 10*3/uL (ref 4.0–10.5)
nRBC: 0 % (ref 0.0–0.2)

## 2021-11-26 LAB — HEPATIC FUNCTION PANEL
ALT: 14 U/L (ref 0–44)
AST: 17 U/L (ref 15–41)
Albumin: 2.8 g/dL — ABNORMAL LOW (ref 3.5–5.0)
Alkaline Phosphatase: 139 U/L — ABNORMAL HIGH (ref 38–126)
Bilirubin, Direct: 0.1 mg/dL (ref 0.0–0.2)
Indirect Bilirubin: 0.4 mg/dL (ref 0.3–0.9)
Total Bilirubin: 0.5 mg/dL (ref 0.3–1.2)
Total Protein: 6.1 g/dL — ABNORMAL LOW (ref 6.5–8.1)

## 2021-11-26 LAB — PROTIME-INR
INR: 1.1 (ref 0.8–1.2)
Prothrombin Time: 13.7 seconds (ref 11.4–15.2)

## 2021-11-26 LAB — LACTIC ACID, PLASMA
Lactic Acid, Venous: 0.8 mmol/L (ref 0.5–1.9)
Lactic Acid, Venous: 1 mmol/L (ref 0.5–1.9)

## 2021-11-26 LAB — TSH: TSH: 5.546 u[IU]/mL — ABNORMAL HIGH (ref 0.350–4.500)

## 2021-11-26 LAB — MAGNESIUM: Magnesium: 2.1 mg/dL (ref 1.7–2.4)

## 2021-11-26 LAB — HEPARIN LEVEL (UNFRACTIONATED): Heparin Unfractionated: 0.22 IU/mL — ABNORMAL LOW (ref 0.30–0.70)

## 2021-11-26 LAB — CARBAMAZEPINE LEVEL, TOTAL: Carbamazepine Lvl: 5.3 ug/mL (ref 4.0–12.0)

## 2021-11-26 LAB — BRAIN NATRIURETIC PEPTIDE: B Natriuretic Peptide: 482.4 pg/mL — ABNORMAL HIGH (ref 0.0–100.0)

## 2021-11-26 MED ORDER — ANASTROZOLE 1 MG PO TABS
1.0000 mg | ORAL_TABLET | Freq: Every day | ORAL | Status: DC
Start: 2021-11-27 — End: 2021-12-02
  Administered 2021-11-27 – 2021-12-01 (×5): 1 mg via ORAL
  Filled 2021-11-26 (×5): qty 1

## 2021-11-26 MED ORDER — ROSUVASTATIN CALCIUM 5 MG PO TABS
10.0000 mg | ORAL_TABLET | Freq: Every day | ORAL | Status: DC
Start: 2021-11-27 — End: 2021-12-02
  Administered 2021-11-27 – 2021-12-01 (×5): 10 mg via ORAL
  Filled 2021-11-26 (×5): qty 2

## 2021-11-26 MED ORDER — BUPROPION HCL ER (XL) 150 MG PO TB24
300.0000 mg | ORAL_TABLET | Freq: Every day | ORAL | Status: DC
  Administered 2021-11-27 – 2021-12-01 (×5): 300 mg via ORAL
  Filled 2021-11-26 (×5): qty 2

## 2021-11-26 MED ORDER — GABAPENTIN 100 MG PO CAPS
200.0000 mg | ORAL_CAPSULE | Freq: Three times a day (TID) | ORAL | Status: DC
  Administered 2021-11-26 – 2021-12-01 (×16): 200 mg via ORAL
  Filled 2021-11-26 (×16): qty 2

## 2021-11-26 MED ORDER — METOPROLOL SUCCINATE ER 25 MG PO TB24
25.0000 mg | ORAL_TABLET | Freq: Every day | ORAL | Status: DC
  Administered 2021-11-27 – 2021-11-28 (×2): 25 mg via ORAL
  Filled 2021-11-26 (×3): qty 1

## 2021-11-26 MED ORDER — AMIODARONE HCL 200 MG PO TABS
200.0000 mg | ORAL_TABLET | Freq: Every day | ORAL | Status: DC
  Administered 2021-11-27 – 2021-12-01 (×5): 200 mg via ORAL
  Filled 2021-11-26 (×5): qty 1

## 2021-11-26 MED ORDER — FERROUS SULFATE 325 (65 FE) MG PO TABS
325.0000 mg | ORAL_TABLET | Freq: Every day | ORAL | Status: DC
  Administered 2021-11-27 – 2021-12-01 (×5): 325 mg via ORAL
  Filled 2021-11-26 (×5): qty 1

## 2021-11-26 MED ORDER — LATANOPROST 0.005 % OP SOLN
1.0000 [drp] | Freq: Every day | OPHTHALMIC | Status: DC
  Administered 2021-11-26 – 2021-11-30 (×5): 1 [drp] via OPHTHALMIC
  Filled 2021-11-26: qty 2.5

## 2021-11-26 MED ORDER — QUETIAPINE FUMARATE 50 MG PO TABS
200.0000 mg | ORAL_TABLET | Freq: Every day | ORAL | Status: DC
  Administered 2021-11-26 – 2021-12-01 (×6): 200 mg via ORAL
  Filled 2021-11-26 (×6): qty 4

## 2021-11-26 MED ORDER — DULOXETINE HCL 30 MG PO CPEP
30.0000 mg | ORAL_CAPSULE | Freq: Two times a day (BID) | ORAL | Status: DC
  Administered 2021-11-27 – 2021-12-01 (×10): 30 mg via ORAL
  Filled 2021-11-26 (×11): qty 1

## 2021-11-26 MED ORDER — HEPARIN BOLUS VIA INFUSION
1000.0000 [IU] | Freq: Once | INTRAVENOUS | Status: AC
Start: 2021-11-26 — End: 2021-11-26
  Administered 2021-11-26: 23:00:00 1000 [IU] via INTRAVENOUS
  Filled 2021-11-26: qty 1000

## 2021-11-26 MED ORDER — POTASSIUM CHLORIDE CRYS ER 20 MEQ PO TBCR
20.0000 meq | EXTENDED_RELEASE_TABLET | Freq: Every day | ORAL | Status: DC
  Administered 2021-11-26 – 2021-12-01 (×6): 20 meq via ORAL
  Filled 2021-11-26 (×6): qty 1

## 2021-11-26 MED ORDER — CHOLECALCIFEROL 10 MCG (400 UNIT) PO TABS
400.0000 [IU] | ORAL_TABLET | Freq: Every day | ORAL | Status: DC
  Administered 2021-11-26 – 2021-12-01 (×6): 400 [IU] via ORAL
  Filled 2021-11-26 (×6): qty 1

## 2021-11-26 MED ORDER — FUROSEMIDE 10 MG/ML IJ SOLN
40.0000 mg | Freq: Once | INTRAMUSCULAR | Status: AC
  Administered 2021-11-26: 18:00:00 40 mg via INTRAVENOUS
  Filled 2021-11-26: qty 4

## 2021-11-26 MED ORDER — VORTIOXETINE HBR 20 MG PO TABS
20.0000 mg | ORAL_TABLET | Freq: Every day | ORAL | Status: DC
  Administered 2021-11-27 – 2021-12-01 (×5): 20 mg via ORAL
  Filled 2021-11-26 (×5): qty 1

## 2021-11-26 MED ORDER — IOHEXOL 350 MG/ML SOLN
50.0000 mL | Freq: Once | INTRAVENOUS | Status: AC | PRN
Start: 2021-11-26 — End: 2021-11-26
  Administered 2021-11-26: 14:00:00 50 mL via INTRAVENOUS

## 2021-11-26 MED ORDER — HEPARIN (PORCINE) 25000 UT/250ML-% IV SOLN
1500.0000 [IU]/h | INTRAVENOUS | Status: DC
Start: 2021-11-26 — End: 2021-11-29
  Administered 2021-11-26: 15:00:00 1200 [IU]/h via INTRAVENOUS
  Administered 2021-11-27 (×2): 1450 [IU]/h via INTRAVENOUS
  Administered 2021-11-28: 1500 [IU]/h via INTRAVENOUS
  Filled 2021-11-26 (×4): qty 250

## 2021-11-26 MED ORDER — CARBAMAZEPINE ER 200 MG PO TB12
400.0000 mg | ORAL_TABLET | Freq: Two times a day (BID) | ORAL | Status: DC
  Administered 2021-11-26 – 2021-12-01 (×11): 400 mg via ORAL
  Filled 2021-11-26 (×11): qty 2

## 2021-11-26 MED ORDER — CLOPIDOGREL BISULFATE 75 MG PO TABS
75.0000 mg | ORAL_TABLET | Freq: Every day | ORAL | Status: DC
  Administered 2021-11-27 – 2021-12-01 (×5): 75 mg via ORAL
  Filled 2021-11-26 (×5): qty 1

## 2021-11-26 MED ORDER — LIOTHYRONINE SODIUM 25 MCG PO TABS
25.0000 ug | ORAL_TABLET | Freq: Every day | ORAL | Status: DC
  Administered 2021-11-27 – 2021-12-01 (×5): 25 ug via ORAL
  Filled 2021-11-26 (×5): qty 1

## 2021-11-26 MED ORDER — OXYCODONE-ACETAMINOPHEN 5-325 MG PO TABS
2.0000 | ORAL_TABLET | Freq: Once | ORAL | Status: AC
  Administered 2021-11-26: 23:00:00 2 via ORAL
  Filled 2021-11-26: qty 2

## 2021-11-26 MED ORDER — LEVOTHYROXINE SODIUM 88 MCG PO TABS
88.0000 ug | ORAL_TABLET | Freq: Every day | ORAL | Status: DC
  Administered 2021-11-27 – 2021-12-01 (×5): 88 ug via ORAL
  Filled 2021-11-26 (×5): qty 1

## 2021-11-26 MED ORDER — SIMETHICONE 80 MG PO CHEW: 80.0000 mg | CHEWABLE_TABLET | Freq: Four times a day (QID) | ORAL | Status: DC | PRN

## 2021-11-26 MED ORDER — HEPARIN BOLUS VIA INFUSION
4500.0000 [IU] | Freq: Once | INTRAVENOUS | Status: AC
  Administered 2021-11-26: 15:00:00 4500 [IU] via INTRAVENOUS
  Filled 2021-11-26: qty 4500

## 2021-11-26 MED ORDER — CYCLOBENZAPRINE HCL 10 MG PO TABS
10.0000 mg | ORAL_TABLET | Freq: Three times a day (TID) | ORAL | Status: DC | PRN
  Administered 2021-11-27 – 2021-11-28 (×2): 10 mg via ORAL
  Filled 2021-11-26 (×3): qty 1

## 2021-11-26 NOTE — ED Triage Notes (Signed)
Patient BIB [facility] for worsened shortness of breath. Patient states she has shortness of breath with exertion at baseline but states when she woke this morning she was short of breath at rest. Does not wear O2 at baseline, room air SpO2 today 92%. Bilateral lower extremity edema.  Patient alert, oriented, and in no apparent distress at this time. ? ? ?

## 2021-11-26 NOTE — Procedures (Signed)
EEG Inpatient Electroencephalogram Report ?Clinical Neurophysiology Services ? ? ?Patient Name: Deborah Cooke ?Age: 71 y.o. ?Sex: female ?MRN: [id number] ?Date: 11/26/2021 ?Location: [phone]  ?Neurologist:  Lynnae Sandhoff, MD ? ?Clinical Information ?Deborah Cooke is a 71 y.o. female shoulder surgery beginning of last month followed by having a heart attack and getting four-vessel bypass.  She went to rehab and was just discharged from rehab 3 days ago.  She has been doing poorly at home.  Generally weak all over unsteady on her feet.  Has had multiple falls.  Uses walker.  Increased shortness of breath.  No cough abdominal pain vomiting diarrhea.  No urinary symptoms.  States hands are trembling and she cannot hold a cup of coffee or feed herself.  Son does not feel like she can manage at home.  Her husband is off for weeks at a time driving a truck. ? ?Medications ?Prior to Admission medications   ?Medication Sig Start Date End Date Taking? Authorizing Provider  ?acetaminophen (TYLENOL) 325 MG tablet Take 2 tablets (650 mg total) by mouth every 6 (six) hours as needed for mild pain or moderate pain. 11/11/21  Yes Antony Odea, PA-C  ?amiodarone (PACERONE) 200 MG tablet Take 1 tablet (200 mg total) by mouth daily. 11/11/21  Yes Roddenberry, Arlis Porta, PA-C  ?anastrozole (ARIMIDEX) 1 MG tablet TAKE 1 TABLET BY MOUTH  DAILY ?Patient taking differently: Take 1 mg by mouth daily. 08/21/21  Yes Truitt Merle, MD  ?aspirin EC 81 MG EC tablet Take 1 tablet (81 mg total) by mouth daily. Swallow whole. 11/11/21  Yes Antony Odea, PA-C  ?B Complex Vitamins (B COMPLEX 100 PO) Take 1 tablet by mouth daily.   Yes [provider]  ?Biotin 10000 MCG TABS Take 10,000 mcg by mouth daily.   Yes [provider]  ?buPROPion (WELLBUTRIN XL) 300 MG 24 hr tablet Take 300 mg by mouth daily. 06/09/21  Yes [provider]  ?Calcium Carbonate (CALCIUM 600 PO) Take 600 mg by mouth in the morning and at bedtime.   Yes  [provider]  ?carbamazepine (TEGRETOL XR) 200 MG 12 hr tablet Take 400 mg by mouth 2 (two) times daily. 07/26/18 12/26/21 Yes [provider]  ?cholecalciferol (VITAMIN D) 400 units TABS tablet Take 400 Units by mouth daily.   Yes [provider]  ?clopidogrel (PLAVIX) 75 MG tablet Take 1 tablet (75 mg total) by mouth daily. Do not stop unless directed by cardiology or cardiothoracic surgery. 11/11/21  Yes Roddenberry, Arlis Porta, PA-C  ?cyclobenzaprine (FLEXERIL) 10 MG tablet Take 1 tablet (10 mg total) by mouth 3 (three) times daily as needed for muscle spasms. 10/17/20  Yes McClung, Sarah A, PA-C  ?DULoxetine (CYMBALTA) 30 MG capsule Take 30 mg by mouth 2 (two) times daily.   Yes [provider]  ?ferrous sulfate 325 (65 FE) MG EC tablet Take 325 mg by mouth daily with breakfast.   Yes [provider]  ?furosemide (LASIX) 40 MG tablet Take 1 tablet (40 mg total) by mouth daily. 11/11/21  Yes Roddenberry, Arlis Porta, PA-C  ?gabapentin (NEURONTIN) 100 MG capsule Take 2 capsules (200 mg total) by mouth 3 (three) times daily. 11/11/21  Yes Roddenberry, Arlis Porta, PA-C  ?latanoprost (XALATAN) 0.005 % ophthalmic solution Place 1 drop into both eyes at bedtime. 06/25/16  Yes [provider]  ?levothyroxine (SYNTHROID) 88 MCG tablet Take 88 mcg by mouth daily.   Yes [provider]  ?liothyronine (CYTOMEL) 25 MCG  tablet Take 25 mcg by mouth daily. 10/28/21  Yes [provider]  ?metoprolol succinate (TOPROL-XL) 25 MG 24 hr tablet Take 1 tablet (25 mg total) by mouth daily. 11/11/21  Yes Antony Odea, PA-C  ?Multiple Vitamins-Iron (MULTIVITAMIN/IRON PO) Take 1 tablet by mouth daily.    Yes [provider]  ?mupirocin ointment (BACTROBAN) 2 % Apply 1 application topically 3 (three) times daily as needed for rash or irritation. 10/10/21  Yes [provider]  ?ondansetron (ZOFRAN) 4 MG tablet Take 1 tablet (4 mg total) by mouth every 8 (eight)  hours as needed for nausea or vomiting. 10/20/21  Yes Merlene Pulling K, PA-C  ?oxyCODONE-acetaminophen (PERCOCET) 10-325 MG tablet Take 1 tablet by mouth 4 (four) times daily.   Yes [provider]  ?pantoprazole (PROTONIX) 40 MG tablet Take 1 tablet (40 mg total) by mouth daily. 11/12/21  Yes Roddenberry, Arlis Porta, PA-C  ?polyethylene glycol (MIRALAX / GLYCOLAX) 17 g packet Take 17 g by mouth 2 (two) times daily. ?Patient taking differently: Take 17 g by mouth daily. 10/21/20  Yes Patrecia Pour, MD  ?potassium chloride SA (KLOR-CON M) 20 MEQ tablet Take 1 tablet (20 mEq total) by mouth daily. 11/11/21  Yes Roddenberry, Arlis Porta, PA-C  ?QUEtiapine (SEROQUEL) 100 MG tablet Take 2 tablets (200 mg total) by mouth at bedtime. 11/11/21  Yes Roddenberry, Arlis Porta, PA-C  ?rosuvastatin (CRESTOR) 10 MG tablet Take 1 tablet (10 mg total) by mouth daily. 11/11/21  Yes Antony Odea, PA-C  ?simethicone (MYLICON) 80 MG chewable tablet Chew 80 mg by mouth every 6 (six) hours as needed for flatulence.   Yes [provider]  ?TRINTELLIX 20 MG TABS tablet Take 20 mg by mouth daily. 11/24/21  Yes [provider]  ? ?Recording Conditions ?This is a routine less than one hour EEG recording.  The EEG was performed utilizing standard international 10-20 system of electrode placement, with additional channels monitored for eye movement.  One channel electrocardiogram was monitored.  Data were obtained, stored, and interpreted according to ACNS guidelines (J Clin Neurophysiol 2006;23(2):85-183) utilizing referential montage recording, with reformatting to longitudinal, transverse bipolar, and referential montages as necessary for interpretation, along with digital/automated EEG analysis.  Patient tolerated entire procedure well.  Photic stimulation and hyperventilation were utilized as activation procedures unless otherwise specified below. ? ? ?EEG Description ?Background Activity: The posterior dominant rhythm (PDR)  was 8-'9Hz'$  and reactive. The predominant background consisted of normal voltage and normal anterior to posterior progression.  ?Paroxysmal Abnormalities: None ?Drowsiness/Sleep: Normal fragmentation of background with occasional vertex waves appreciated during the recording. ?Stimulation: Hyperventilation and photic stimulation were not performed. ?ECG Recording: abundant artifact. ?Recording Quality: Minor muscle, motion, and eye movement artifacts were occasionally noted. ? ?EEG Interpretation ?Ictal Events: None ?2.   Interictal Abnormalities: none ?3.   Non-Epileptic Events: none ? ?Clinical Correlation  ? ?This is a normal EEG. No interictal epileptiform abnormalities were recorded.  The absence of epileptiform abnormalities does not preclude a clinical diagnosis of seizures.    ? ?Electronically signed by:  ?Lynnae Sandhoff, MD ?Page: [phone] ?11/26/2021, 9:50 PM ? ? ? ?

## 2021-11-26 NOTE — Progress Notes (Signed)
ANTICOAGULATION CONSULT NOTE - Initial Consult ? ?Pharmacy Consult for Heparin ?Indication: pulmonary embolus ? ?Allergies  ?Allergen Reactions  ? Nsaids Nausea And Vomiting and Other (See Comments)  ?  GI Upset (ibuprofen included)  ? Ibuprofen Other (See Comments)  ?  Other reaction(s): Abdominal Pain  ? Tolmetin Other (See Comments)  ?  Other reaction(s): Abdominal Pain ?  ? ? ?Patient Measurements: ?  ?Heparin Dosing Weight: 72 kg ? ?Vital Signs: ?Temp: 98.1 ?F (36.7 ?C) (03/09 1033) ?Temp Source: Oral (03/09 1033) ?BP: 93/77 (03/09 1235) ?Pulse Rate: 76 (03/09 1240) ? ?Labs: ?Recent Labs  ?  11/26/21 ?1119 11/26/21 ?1310  ?HGB 9.7*  --   ?HCT 30.8*  --   ?PLT 373  --   ?CREATININE 1.17*  --   ?TROPONINIHS 54* 50*  ? ? ?CrCl cannot be calculated (Unknown ideal weight.). ? ? ?Medical History: ?Past Medical History:  ?Diagnosis Date  ? Adverse effect of anesthetic 01/2012  ? difficulty waking up.  ? Adverse effect of unspecified anesthetic, initial encounter 08/2011  ? low o2 sats.  ? Anemia   ? Anxiety   ? Arthritis   ? Breast cancer ([place]) 02/16/2017  ? left breast  ? Chronic pain   ? Closed supracondylar fracture of right femur ([place]) 09/18/2012  ? Depression   ? Family history of breast cancer   ? GERD (gastroesophageal reflux disease)   ? pt denies  ? Headache(784.0)   ? Hypertension   ? Hypothyroidism   ? Opioid dependence in controlled environment [facility])   ? Osteoporosis   ? Osteoporosis with fracture 09/19/2012  ? Pneumonia   ? Thyroid disease   ? ? ?Medications:  ?(Not in a hospital admission) ? ?Scheduled:  ?Infusions:  ?PRN:  ? 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 Patient is presenting with SOB. Heparin per pharmacy consult placed for pulmonary embolus.  EDP consult CT surg who is ok with heparin from their perspective. ? ?CTA PE w/ small segmental acute PE in rt upper  and lower lobe ? ?Patient is not on anticoagulation prior to arrival. ? ?Hgb 9.7 - BL; plt 373 ? ?Goal of Therapy:  ?Heparin level 0.3-0.7 units/ml ?Monitor platelets by anticoagulation protocol: Yes ?  ?Plan:  ?Give 4500 units bolus x 1 ?Start heparin infusion at 1200 units/hr ?Check anti-Xa level in 7 hours and daily while on heparin ?Continue to monitor H&H and platelets ? ?Lorelei Pont, PharmD, BCPS ?11/26/2021 2:14 PM ?ED Clinical Pharmacist -  [phone] ? ?  ?

## 2021-11-26 NOTE — Consult Note (Addendum)
Cardiology Consultation:   Patient ID: Deborah Cooke MRN: [id number]; DOB: 01-17-1951  Admit date: 11/26/2021 Date of Consult: 11/26/2021  PCP:  Rich Fuchs, PA   [facility] Providers Cardiologist:  Larae Grooms, MD        Patient Profile:   Deborah Cooke is a 71 y.o. female with a hx of shoulder surgery in Jan then STEMI 10/26/21 and CHB-cath with significant CAD and temp pacer placed then CABG .  who is being seen 11/26/2021 for the evaluation of CHF in combination with PE at the request of Dr Hal Hope.  History of Present Illness:   Ms. Whan with above hx with inf STEMI 10/26/21 during which thrombus of RCA with attempt at extraction unsuccessful,  but with significant disease including LM, EF was normal and balloon pump placed in cath lab along with Temp pacer for CHB.   It was felt with her coronary anatomy PCI was not feasible and high risk surgery felt to be best approach.  TCTS was consulted for CABG.  She underwent CABG X 4 (native saphenous vein graft to LAD, cryopreserved allograft saphenous vein graft to the posterior descending, sequential cryopreserved allograft vein graft to the OM1 and OM2).  In OR she did have V fib. Shocked with internal cardiac massage while chest opened.  She did well with surgery and IABP was weaned and removed post op day 4.  Than inotropic support weaned over a few days.    She developed atrial fib post op and IV amiodarone was started.  She did convert back to SR.   She developed ileus that did resolve prior to discharge, she was discharged to SNF for rehab. D/c'd with amiodarone - lasix po at discharge on 11/11/21.   She was placed on Fe for anemia.   She will ned to remain on Plavix indefinitely  for segment of cryovein used for bypass conduit.  She was discharged from rehab 3 days ago.  Has not done well since SNF discharge.  She is weak and unsteady on her feet and has had multiple fall.  Uses walker. She has a tremor and cannot feed herself.     Today pt presented to ER by EMS with increased SOB.  Her normal is DOE but today she was SOB at rest.  CXR Portable with NAD + cardiomegaly.  Small segmental acute PE in rt upper lobe and rt lower lobe PA. Sm pericardial effusion. Mild mosaic attenuation in the lungs, potentially reflecting a component of mild edema or bronchiolitis obliterans  She was placed on IV heparin.   Now with CT head for AMS/Headache   EKG:  The EKG was personally reviewed and demonstrates:  SR with 1st degree AV block and no acute changes from 11/03/21 Telemetry:  Telemetry was personally reviewed and demonstrates:  SR with PVCs  Na 136, K+ 3.8 BUN 27 Cr 1.17  BNP 482 hs troponin 54 50  Hgb 9.7 WBC 7.3 plts 373 Mg+ 2.1  BP 132/92  P 113 R 25.  Currently doing well still with headache some has had for several days.  and daughter in law with pt  Neg DVT bil on dopplers  Past Medical History:  Diagnosis Date   Adverse effect of anesthetic 01/2012   difficulty waking up.   Adverse effect of unspecified anesthetic, initial encounter 08/2011   low o2 sats.   Anemia    Anxiety    Arthritis    Breast cancer ([place]) 02/16/2017   left  breast   Chronic pain    Closed supracondylar fracture of right femur ([place]) 09/18/2012   Depression    Family history of breast cancer    GERD (gastroesophageal reflux disease)    pt denies   Headache(784.0)    Hypertension    Hypothyroidism    Opioid dependence in controlled environment ([place])    Osteoporosis    Osteoporosis with fracture 09/19/2012   Pneumonia    Thyroid disease     Past Surgical History:  Procedure Laterality Date    OPEN REDUCTION INTERNAL FIXATION (ORIF) DISTAL FEMUR FRACTURE (Left Leg Upper)  10/15/2020   BACK SURGERY     fusion mid back, lumbar and thoracic   BREAST BIOPSY Left 02/16/2017   BREAST LUMPECTOMY WITH RADIOACTIVE SEED AND SENTINEL LYMPH NODE BIOPSY Left 03/15/2017   Procedure: LEFT BREAST LUMPECTOMY WITH RADIOACTIVE SEED AND LEFT  AXILLARY SENTINEL LYMPH NODE BIOPSY;  Surgeon: [name] Overall, MD;  Location: [place];  Service: General;  Laterality: Left;   CHOLECYSTECTOMY  10/1975   CORONARY ARTERY BYPASS GRAFT N/A 10/27/2021   Procedure: CORONARY ARTERY BYPASS GRAFTING (CABG) TIMES FOUR, USING LEFT INTERNAL MAMMARY ARTERY AND RIGHT LEG GREATER SAPHENOUS VEIN HARVESTED ENDOSCOPICALLY AND RIGHT CORONARY THROMBECTOMY;  Surgeon: Dahlia Byes, MD;  Location: [place];  Service: Open Heart Surgery;  Laterality: N/A;   CORONARY/GRAFT ACUTE MI REVASCULARIZATION N/A 10/26/2021   Procedure: Coronary/Graft Acute MI Revascularization;  Surgeon: Jettie Booze, MD;  Location: [place] [facility];  Service: Cardiovascular;  Laterality: N/A;   ENDOVEIN HARVEST OF GREATER SAPHENOUS VEIN Right 10/27/2021   Procedure: ENDOVEIN HARVEST OF GREATER SAPHENOUS VEIN;  Surgeon: Dahlia Byes, MD;  Location: [place];  Service: Open Heart Surgery;  Laterality: Right;   FEMUR IM NAIL  09/18/2012   Procedure: INTRAMEDULLARY (IM) RETROGRADE FEMORAL NAILING;  Surgeon: [name], MD;  Location: [place];  Service: Orthopedics;  Laterality: Right;   GASTRIC BYPASS  2003   IABP INSERTION N/A 10/26/2021   Procedure: IABP Insertion;  Surgeon: Jettie Booze, MD;  Location: [place] [facility];  Service: Cardiovascular;  Laterality: N/A;   JOINT REPLACEMENT     LEFT HEART CATH AND CORONARY ANGIOGRAPHY N/A 10/26/2021   Procedure: LEFT HEART CATH AND CORONARY ANGIOGRAPHY;  Surgeon: Jettie Booze, MD;  Location: [place] [facility];  Service: Cardiovascular;  Laterality: N/A;   NECK SURGERY  2000   ORIF FEMUR FRACTURE Left 10/15/2020   Procedure: OPEN REDUCTION INTERNAL FIXATION (ORIF) DISTAL FEMUR FRACTURE;  Surgeon: Shona Needles, MD;  Location: [place];  Service: Orthopedics;  Laterality: Left;   REPLACEMENT TOTAL KNEE  2003   right and left -4 months aoart.   REVERSE SHOULDER ARTHROPLASTY Right 10/20/2021   Procedure: REVERSE  SHOULDER ARTHROPLASTY;  Surgeon: Marchia Bond, MD;  Location: [facility];  Service: Orthopedics;  Laterality: Right;   TEE WITHOUT CARDIOVERSION N/A 10/27/2021   Procedure: TRANSESOPHAGEAL ECHOCARDIOGRAM (TEE);  Surgeon: Dahlia Byes, MD;  Location: [place];  Service: Open Heart Surgery;  Laterality: N/A;   TEMPORARY PACEMAKER N/A 10/26/2021   Procedure: TEMPORARY PACEMAKER;  Surgeon: Jettie Booze, MD;  Location: [place] [facility];  Service: Cardiovascular;  Laterality: N/A;   TONSILLECTOMY     WRIST FRACTURE SURGERY Bilateral 08/2011     Home Medications:  Prior to Admission medications   Medication Sig Start Date End Date Taking? Authorizing Provider  acetaminophen (TYLENOL) 325 MG tablet Take 2 tablets (650 mg total) by mouth every  6 (six) hours as needed for mild pain or moderate pain. 11/11/21  Yes Roddenberry, Arlis Porta, PA-C  amiodarone (PACERONE) 200 MG tablet Take 1 tablet (200 mg total) by mouth daily. 11/11/21  Yes Roddenberry, Myron G, PA-C  anastrozole (ARIMIDEX) 1 MG tablet TAKE 1 TABLET BY MOUTH  DAILY Patient taking differently: Take 1 mg by mouth daily. 08/21/21  Yes Truitt Merle, MD  aspirin EC 81 MG EC tablet Take 1 tablet (81 mg total) by mouth daily. Swallow whole. 11/11/21  Yes Roddenberry, Myron G, PA-C  B Complex Vitamins (B COMPLEX 100 PO) Take 1 tablet by mouth daily.   Yes [provider]  Biotin 10000 MCG TABS Take 10,000 mcg by mouth daily.   Yes [provider]  buPROPion (WELLBUTRIN XL) 300 MG 24 hr tablet Take 300 mg by mouth daily. 06/09/21  Yes [provider]  Calcium Carbonate (CALCIUM 600 PO) Take 600 mg by mouth in the morning and at bedtime.   Yes [provider]  carbamazepine (TEGRETOL XR) 200 MG 12 hr tablet Take 400 mg by mouth 2 (two) times daily. 07/26/18 12/26/21 Yes [provider]  cholecalciferol (VITAMIN D) 400 units TABS tablet Take 400 Units by mouth daily.   Yes [provider]  clopidogrel  (PLAVIX) 75 MG tablet Take 1 tablet (75 mg total) by mouth daily. Do not stop unless directed by cardiology or cardiothoracic surgery. 11/11/21  Yes Roddenberry, Arlis Porta, PA-C  cyclobenzaprine (FLEXERIL) 10 MG tablet Take 1 tablet (10 mg total) by mouth 3 (three) times daily as needed for muscle spasms. 10/17/20  Yes McClung, Sarah A, PA-C  DULoxetine (CYMBALTA) 30 MG capsule Take 30 mg by mouth 2 (two) times daily.   Yes [provider]  ferrous sulfate 325 (65 FE) MG EC tablet Take 325 mg by mouth daily with breakfast.   Yes [provider]  furosemide (LASIX) 40 MG tablet Take 1 tablet (40 mg total) by mouth daily. 11/11/21  Yes Roddenberry, Arlis Porta, PA-C  gabapentin (NEURONTIN) 100 MG capsule Take 2 capsules (200 mg total) by mouth 3 (three) times daily. 11/11/21  Yes Roddenberry, Arlis Porta, PA-C  latanoprost (XALATAN) 0.005 % ophthalmic solution Place 1 drop into both eyes at bedtime. 06/25/16  Yes [provider]  levothyroxine (SYNTHROID) 88 MCG tablet Take 88 mcg by mouth daily.   Yes [provider]  liothyronine (CYTOMEL) 25 MCG tablet Take 25 mcg by mouth daily. 10/28/21  Yes [provider]  metoprolol succinate (TOPROL-XL) 25 MG 24 hr tablet Take 1 tablet (25 mg total) by mouth daily. 11/11/21  Yes Roddenberry, Arlis Porta, PA-C  Multiple Vitamins-Iron (MULTIVITAMIN/IRON PO) Take 1 tablet by mouth daily.    Yes [provider]  mupirocin ointment (BACTROBAN) 2 % Apply 1 application topically 3 (three) times daily as needed for rash or irritation. 10/10/21  Yes [provider]  ondansetron (ZOFRAN) 4 MG tablet Take 1 tablet (4 mg total) by mouth every 8 (eight) hours as needed for nausea or vomiting. 10/20/21  Yes Merlene Pulling K, PA-C  oxyCODONE-acetaminophen (PERCOCET) 10-325 MG tablet Take 1 tablet by mouth 4 (four) times daily.   Yes [provider]  pantoprazole (PROTONIX) 40 MG tablet Take 1 tablet (40 mg total) by mouth daily.  11/12/21  Yes Roddenberry, Arlis Porta, PA-C  polyethylene glycol (MIRALAX / GLYCOLAX) 17 g packet Take 17 g by mouth 2 (two) times daily. Patient taking differently: Take 17 g by mouth daily.  10/21/20  Yes Patrecia Pour, MD  potassium chloride SA (KLOR-CON M) 20 MEQ tablet Take 1 tablet (20 mEq total) by mouth daily. 11/11/21  Yes Roddenberry, Myron G, PA-C  QUEtiapine (SEROQUEL) 100 MG tablet Take 2 tablets (200 mg total) by mouth at bedtime. 11/11/21  Yes Roddenberry, Arlis Porta, PA-C  rosuvastatin (CRESTOR) 10 MG tablet Take 1 tablet (10 mg total) by mouth daily. 11/11/21  Yes Roddenberry, Arlis Porta, PA-C  simethicone (MYLICON) 80 MG chewable tablet Chew 80 mg by mouth every 6 (six) hours as needed for flatulence.   Yes [provider]  TRINTELLIX 20 MG TABS tablet Take 20 mg by mouth daily. 11/24/21  Yes [provider]    Inpatient Medications: Scheduled Meds:  Continuous Infusions:  heparin 1,200 Units/hr (11/26/21 1450)   PRN Meds:   Allergies:    Allergies  Allergen Reactions   Nsaids Nausea And Vomiting and Other (See Comments)    GI Upset (ibuprofen included)   Ibuprofen Other (See Comments)    Other reaction(s): Abdominal Pain   Tolmetin Other (See Comments)    Other reaction(s): Abdominal Pain     Social History:   Social History   Socioeconomic History   Marital status: Married    Spouse name: Not on file   Number of children: Not on file   Years of education: Not on file   Highest education level: Not on file  Occupational History   Not on file  Tobacco Use   Smoking status: Former    Packs/day: 1.00    Years: 28.00    Pack years: 28.00    Types: Cigarettes    Quit date: 01/17/1989    Years since quitting: 32.8   Smokeless tobacco: Former    Quit date: 01/17/1989  Vaping Use   Vaping Use: Never used  Substance and Sexual Activity   Alcohol use: Yes    Comment: rarely  glass of wine a month   Drug use: No   Sexual activity: Yes    Birth  control/protection: None  Other Topics Concern   Not on file  Social History Narrative   Not on file   Social Determinants of Health   Financial Resource Strain: Not on file  Food Insecurity: Not on file  Transportation Needs: Not on file  Physical Activity: Not on file  Stress: Not on file  Social Connections: Not on file  Intimate Partner Violence: Not on file    Family History:    Family History  Problem Relation Age of Onset   Breast cancer Mother 64       died at 29   Cancer Father 38       uretherial cancer      ROS:  Please see the history of present illness.  General:no colds or fevers, no weight changes Skin:no rashes or ulcers HEENT:no blurred vision, no congestion CV:see HPI PUL:see HPI GI:no diarrhea constipation or melena, no indigestion GU:no hematuria, no dysuria MS:no joint pain, no claudication + lower ext edema Neuro:no syncope, no lightheadedness Endo:no diabetes, + thyroid disease  All other ROS reviewed and negative.     Physical Exam/Data:   Vitals:   11/26/21 1240 11/26/21 1434 11/26/21 1530 11/26/21 1600  BP:   (!) 132/92   Pulse: 76  71   Resp: 20  20   Temp:      TempSrc:      SpO2: 90%  (!) 89% 92%  Weight:  111.1 kg  Height:  _0  (1.499 m)     No intake or output data in the 24 hours ending 11/26/21 1625 Last 3 Weights 11/26/2021 11/11/2021 11/09/2021  Weight (lbs) 245 lb 249 lb 243 lb 9.7 oz  Weight (kg) 111.131 kg 112.946 kg 110.5 kg     Body mass index is 49.48 kg/m.  General:  Well nourished, well developed, in no acute distress though dyspneic on talking HEENT: normal Neck: mild JVD Vascular: No carotid bruits; Distal pulses 2+ bilaterally Cardiac:  normal S1, S2; RRR; no murmur gallup rub or click Lungs:  clear to auscultation bilaterally, no wheezing, rhonchi or rales  Abd: soft, nontender, no hepatomegaly  Ext: ++lower ext edema Musculoskeletal:  No deformities, BUE and BLE strength normal and equal Skin: warm  and dry  Neuro:  alert and oriented X 3 MAW follows commands  no focal abnormalities noted Psych:  Normal affect    Relevant CV Studies: 10/26/21  cath   Prox RCA lesion is 75% stenosed.   Mid RCA to Dist RCA lesion is 75% stenosed with 75% stenosed side branch in RPDA.  Heavy thrombus.   Mid LM to Ost LAD lesion is 80% stenosed.   The left ventricular systolic function is normal.   LV end diastolic pressure is moderately elevated.   The left ventricular ejection fraction is 55-65% by visual estimate.   There is no aortic valve stenosis.   Severe multivessel coronary artery disease including heavily thrombotic, diffuse lesion in the mid to distal RCA and PDA.  Severe distal left main disease extending into the ostial LAD.  Not good anatomy for PCI in either the left main or the RCA given the thrombus burden.  Temp pacer placed for complete heart block. Intra-aortic balloon pump placed due to severe disease and need for cardiac surgery.   Dr. Prescott Gum consulted and we discussed the plan.  Will anticoagulate overnight with Angiomax.  IV antiplatelet therapy with cangrelor.  Plan for bypass surgery in the morning.   I spoke to the patient's husband, [name].  He was in [facility] as he is a Administrator.  He will be headed back to [facility].  Diagnostic Dominance: Right Intervention    ECHO 10/27/21 IMPRESSIONS     1. Left ventricular ejection fraction, by estimation, is 60 to 65%. The  left ventricle has normal function. The left ventricle has no regional  wall motion abnormalities. Left ventricular diastolic parameters are  consistent with Grade II diastolic  dysfunction (pseudonormalization). There is the interventricular septum is  flattened in diastole ('D' shaped left ventricle), consistent with right  ventricular volume overload.   2. There is preserved contractility of the right ventricular apex with  moderate hypokinesis of the right ventricular wall,  consistent with acute  cor pulmonale (McConnell's sign), versus right ventricular free wall  infarction. Right ventricular systolic  function is moderately reduced. The right ventricular size is severely  enlarged. There is mildly elevated pulmonary artery systolic pressure.   3. Left atrial size was moderately dilated.   4. Right atrial size was moderately dilated.   5. The mitral valve is degenerative. No evidence of mitral valve  regurgitation. The mean mitral valve gradient is 3.0 mmHg. Severe mitral  annular calcification.   6. Tricuspid valve regurgitation is moderate to severe.   7. The aortic valve was not well visualized. Aortic valve regurgitation  is not visualized. Aortic valve sclerosis/calcification is present,  without any evidence of aortic stenosis.  8. There is mild dilatation of the ascending aorta, measuring 39 mm.   9. The inferior vena cava is normal in size with <50% respiratory  variability, suggesting right atrial pressure of 8 mmHg.   FINDINGS   Left Ventricle: Left ventricular ejection fraction, by estimation, is 60  to 65%. The left ventricle has normal function. The left ventricle has no  regional wall motion abnormalities. The left ventricular internal cavity  size was normal in size. There is   no left ventricular hypertrophy. The interventricular septum is flattened  in diastole ('D' shaped left ventricle), consistent with right ventricular  volume overload. Left ventricular diastolic parameters are consistent with  Grade II diastolic  dysfunction (pseudonormalization).   Right Ventricle: There is preserved contractility of the right ventricular  apex with moderate hypokinesis of the right ventricular wall, consistent  with acute cor pulmonale (McConnell's sign), versus right ventricular free  wall infarction. The right  ventricular size is severely enlarged. No increase in right ventricular  wall thickness. Right ventricular systolic function is  moderately reduced.  There is mildly elevated pulmonary artery systolic pressure. The tricuspid  regurgitant velocity is 3.00  m/s, and with an assumed right atrial pressure of 8 mmHg, the estimated  right ventricular systolic pressure is 47.8 mmHg.   Left Atrium: Left atrial size was moderately dilated.   Right Atrium: Right atrial size was moderately dilated.   Pericardium: There is no evidence of pericardial effusion.   Mitral Valve: The mitral valve is degenerative in appearance. There is  mild thickening of the mitral valve leaflet(s). Severe mitral annular  calcification. No evidence of mitral valve regurgitation. MV peak  gradient, 7.0 mmHg. The mean mitral valve  gradient is 3.0 mmHg with average heart rate of 70 bpm.   Tricuspid Valve: The tricuspid valve is not well visualized. Tricuspid  valve regurgitation is moderate to severe.   Aortic Valve: The aortic valve was not well visualized. Aortic valve  regurgitation is not visualized. Aortic valve sclerosis/calcification is  present, without any evidence of aortic stenosis. Aortic valve mean  gradient measures 9.0 mmHg. Aortic valve  peak gradient measures 18.3 mmHg.   Pulmonic Valve: The pulmonic valve was not assessed.   Aorta: The aortic root is normal in size and structure. There is mild  dilatation of the ascending aorta, measuring 39 mm.   Venous: The inferior vena cava is normal in size with less than 50%  respiratory variability, suggesting right atrial pressure of 8 mmHg.   IAS/Shunts: The interatrial septum was not assessed.   Additional Comments: A device lead is visualized in the right ventricle.    Echo intraoperative TEE PROCEDURE: Intraoperative Transesophogeal Overall difficult study. Unable  to advance into stomach (hx gastric bypass), resistance met in distal  esophagus.  Complications: No known complications during this procedure.  POST-OP IMPRESSIONS  _ Left Ventricle: The left ventricle is  unchanged from pre-bypass.  _ Right Ventricle: The right ventricle appears unchanged from pre-bypass.  _ Aorta: The aorta appears unchanged from pre-bypass.  _ Left Atrium: The left atrium appears unchanged from pre-bypass.  _ Left Atrial Appendage: The left atrial appendage appears unchanged from  pre-bypass.  _ Aortic Valve: The aortic valve appears unchanged from pre-bypass.  _ Mitral Valve: The mitral valve appears unchanged from pre-bypass.  _ Tricuspid Valve: The tricuspid valve appears unchanged from pre-bypass.  _ Pulmonic Valve: The pulmonic valve appears unchanged from pre-bypass.  _ Interatrial Septum: The interatrial septum appears unchanged from  pre-bypass.  _ Interventricular Septum: The interventricular septum appears unchanged  from  pre-bypass.  _ Pericardium: The pericardium appears unchanged from pre-bypass.   PRE-OP FINDINGS   Left Ventricle: The left ventricle has low normal systolic function, with  an ejection fraction of 50-55%. The cavity size was normal. No evidence of  left ventricular regional wall motion abnormalities. There is mild  concentric left ventricular  hypertrophy.    Right Ventricle: The right ventricle has moderately reduced systolic  function. The cavity was dilated. There is no increase in right  ventricular wall thickness.   Left Atrium: Left atrial size was dilated.   Right Atrium: Right atrial size was dilated.   Interatrial Septum: No atrial level shunt detected by color flow Doppler.   Pericardium: There is no evidence of pericardial effusion.   Mitral Valve: Heavily calcified annulus. Mitral valve regurgitation is  trivial by color flow Doppler. There is No evidence of mitral stenosis.   Tricuspid Valve: The tricuspid valve was normal in structure. Tricuspid  valve regurgitation is moderate by color flow Doppler.   Aortic Valve: The aortic valve is tricuspid. Aortic valve regurgitation  was not visualized by color flow  Doppler. There is no stenosis of the  aortic valve.    Pulmonic Valve: The pulmonic valve was not assessed.  Pulmonic valve regurgitation was not assessed by color flow Doppler.    Aorta: The aorta was not well visualized. The ascending aorta was not well  visualized. The aortic arch was not well visualized. The descending aorta  was not well visualized.      Laboratory Data:  High Sensitivity Troponin:   Recent Labs  Lab 11/26/21 1119 11/26/21 1310  TROPONINIHS 54* 50*     Chemistry Recent Labs  Lab 11/26/21 1119  NA 136  K 3.8  CL 96*  CO2 27  GLUCOSE 92  BUN 27*  CREATININE 1.17*  CALCIUM 8.8*  MG 2.1  GFRNONAA 49*  ANIONGAP 13    No results for input(s): PROT, ALBUMIN, AST, ALT, ALKPHOS, BILITOT in the last 168 hours. Lipids No results for input(s): CHOL, TRIG, HDL, LABVLDL, LDLCALC, CHOLHDL in the last 168 hours.  Hematology Recent Labs  Lab 11/26/21 1119  WBC 7.3  RBC 3.11*  HGB 9.7*  HCT 30.8*  MCV 99.0  MCH 31.2  MCHC 31.5  RDW 16.2*  PLT 373   Thyroid No results for input(s): TSH, FREET4 in the last 168 hours.  BNP Recent Labs  Lab 11/26/21 1119  BNP 482.4*    DDimer No results for input(s): DDIMER in the last 168 hours.   Radiology/Studies:  CT HEAD WO CONTRAST (5MM)  Result Date: 11/26/2021 CLINICAL DATA:  Altered mental status. EXAM: CT HEAD WITHOUT CONTRAST TECHNIQUE: Contiguous axial images were obtained from the base of the skull through the vertex without intravenous contrast. RADIATION DOSE REDUCTION: This exam was performed according to the departmental dose-optimization program which includes automated exposure control, adjustment of the mA and/or kV according to patient size and/or use of iterative reconstruction technique. COMPARISON:  March 13, 2020. FINDINGS: Brain: Mild diffuse cortical atrophy is noted. No mass effect or midline shift is noted. Ventricular size is within normal limits. There is no evidence of mass lesion,  hemorrhage or acute infarction. Vascular: No hyperdense vessel or unexpected calcification. Skull: Normal. Negative for fracture or focal lesion. Sinuses/Orbits: No acute finding. Other: None. IMPRESSION: No acute intracranial abnormality seen. Electronically Signed   By: Bobbe Medico.D.  On: 11/26/2021 16:01   CT Angio Chest PE W/Cm &/Or Wo Cm  Result Date: 11/26/2021 CLINICAL DATA:  Worsening shortness of breath today. CABG on 08/26/2022. Right shoulder total reverse arthroplasty 10/20/2021. Bilateral lower extremity edema. EXAM: CT ANGIOGRAPHY CHEST WITH CONTRAST TECHNIQUE: Multidetector CT imaging of the chest was performed using the standard protocol during bolus administration of intravenous contrast. Multiplanar CT image reconstructions and MIPs were obtained to evaluate the vascular anatomy. RADIATION DOSE REDUCTION: This exam was performed according to the departmental dose-optimization program which includes automated exposure control, adjustment of the mA and/or kV according to patient size and/or use of iterative reconstruction technique. CONTRAST:  28m OMNIPAQUE IOHEXOL 350 MG/ML SOLN COMPARISON:  Multiple exams, including chest radiograph 11/26/2021 FINDINGS: Body habitus reduces diagnostic sensitivity and specificity. Metal artifact from spinal and shoulder hardware also reduces signal to noise ratio, metal artifact reduction processing was employed. Cardiovascular: Prominent main pulmonary artery at 4.5 cm in transverse diameter on image 139 series 8, compatible with pulmonary arterial hypertension. Right lower lobe posterior basal segmental acute pulmonary embolus observed on image 263, series 8. Suspected small segmental right upper lobe pulmonary embolus, image 151 series 8 and also separately on image 129 series 8. No lobar or larger pulmonary embolus identified. Coronary, aortic arch, and branch vessel atherosclerotic vascular disease. Prior CABG with continued edema along the anterior  mediastinal soft tissues and around the sternum with a small pericardial effusion. Moderate cardiomegaly. Substantial mitral valve calcification and mild aortic valve calcifications. Mediastinum/Nodes: As noted above, there is continued edema in the anterior mediastinum around the sternum likely related to the patient's prior CABG. Edema tracks around the sternotomy site. 0.8 cm AP window lymph node on image 43 series 6, probably reactive. Lungs/Pleura: Mild mosaic attenuation in the lungs. There is likely some trace mucus in the trachea dependently. Left anterior subpleural reticulation, suggesting prior radiation therapy for left breast cancer. Trace left pleural effusion. Upper Abdomen: Previous gastric bypass. Musculoskeletal: Right total shoulder arthroplasty. Lower cervical fusion with fixation hardware. Healing sternotomy. Subacute fractures of the right anterior second, third, fourth, fifth, sixth, and seventh ribs, with some early healing response. Fractures of the left anterior third, fourth, fifth, sixth, and seventh ribs are more age indeterminate and could be chronic. Likely chronic superior endplate compression at L1. Review of the MIP images confirms the above findings. IMPRESSION: 1. Small segmental acute pulmonary emboli in the right upper lobe and right lower lobe pulmonary artery. 2. Prominent main pulmonary artery at 4.5 cm in transverse diameter indicating pulmonary arterial hypertension. Underlying cardiomegaly and coronary atherosclerosis noted. 3. Recent CABG with healing median sternotomy and continued edema adjacent to the sternotomy site and anterior mediastinum. Small pericardial effusion. 4. Mild mosaic attenuation in the lungs, potentially reflecting a component of mild edema or bronchiolitis obliterans. 5. Subacute fractures of the right anterior second through seventh ribs. Likely older fractures of the left anterior third through seventh ribs. 6. Other imaging findings of potential  clinical significance: Trace left pleural effusion. Radiation therapy related findings anteriorly in the left lung. Prior gastric bypass. Mitral valve and aortic valve calcification. Critical Value/emergent results were called by telephone at the time of interpretation on 11/26/2021 at 2:05 pm to [facility] who verbally acknowledged these results. Electronically Signed   By: WVan ClinesM.D.   On: 11/26/2021 14:12   DG Chest Port 1 View  Result Date: 11/26/2021 CLINICAL DATA:  Provided history: Shortness of breath. EXAM: PORTABLE CHEST 1 VIEW COMPARISON:  Prior chest radiographs 11/06/2021 and earlier. FINDINGS: Prior median sternotomy/CABG. Unchanged cardiomegaly. No appreciable airspace consolidation or pulmonary edema. No evidence of pleural effusion or pneumothorax. No acute bony abnormality identified. Prior right shoulder arthroplasty. Partially imaged cervical spinal fusion hardware. Surgical clips within the upper abdomen. IMPRESSION: No evidence of acute cardiopulmonary abnormality. Cardiomegaly. Electronically Signed   By: Kellie Simmering D.O.   On: 11/26/2021 11:38   VAS [place] LOWER EXTREMITY VENOUS (DVT)  Result Date: 11/26/2021  Lower Venous DVT Study Patient Name:  Deborah Cooke  Date of Exam:   11/26/2021 Medical Rec #: [id number]     Accession #:    [id number] Date of Birth: 1951/08/18     Patient Gender: F Patient Age:   37 years Exam Location:  [facility] Procedure:      VAS [place] LOWER EXTREMITY VENOUS (DVT) Referring Phys: Gean Birchwood --------------------------------------------------------------------------------  Indications: Pulmonary embolism.  Limitations: Body habitus and poor ultrasound/tissue interface. Comparison Study: no prior Performing Technologist: Archie Patten RVS  Examination Guidelines: A complete evaluation includes B-mode imaging, spectral Doppler, color Doppler, and power Doppler as needed of all accessible portions of each vessel. Bilateral  testing is considered an integral part of a complete examination. Limited examinations for reoccurring indications may be performed as noted. The reflux portion of the exam is performed with the patient in reverse Trendelenburg.  +---------+---------------+---------+-----------+----------+-------------------+  RIGHT     Compressibility Phasicity Spontaneity Properties Thrombus Aging       +---------+---------------+---------+-----------+----------+-------------------+  CFV       Full            Yes       Yes                                         +---------+---------------+---------+-----------+----------+-------------------+  SFJ       Full                                                                  +---------+---------------+---------+-----------+----------+-------------------+  FV Prox   Full                                                                  +---------+---------------+---------+-----------+----------+-------------------+  FV Mid    Full                                                                  +---------+---------------+---------+-----------+----------+-------------------+  FV Distal Full            Yes       Yes                                         +---------+---------------+---------+-----------+----------+-------------------+  PFV       Full                                                                  +---------+---------------+---------+-----------+----------+-------------------+  POP       Full            Yes       Yes                                         +---------+---------------+---------+-----------+----------+-------------------+  PTV       Full                                                                  +---------+---------------+---------+-----------+----------+-------------------+  PERO                                                       Not well visualized  +---------+---------------+---------+-----------+----------+-------------------+    +--------+---------------+---------+-----------+----------+--------------------+  LEFT     Compressibility Phasicity Spontaneity Properties Thrombus Aging        +--------+---------------+---------+-----------+----------+--------------------+  CFV      Full            Yes       Yes                                          +--------+---------------+---------+-----------+----------+--------------------+  SFJ      Full                                                                   +--------+---------------+---------+-----------+----------+--------------------+  FV Prox  Full                                                                   +--------+---------------+---------+-----------+----------+--------------------+  FV Mid   Full                                                                   +--------+---------------+---------+-----------+----------+--------------------+  FV  Full            Yes       Yes                                           Distal                                                                          +--------+---------------+---------+-----------+----------+--------------------+  PFV      Full                                                                   +--------+---------------+---------+-----------+----------+--------------------+  POP      Full                                                                   +--------+---------------+---------+-----------+----------+--------------------+  PTV      Full                                             patent by color                                                                  doppler               +--------+---------------+---------+-----------+----------+--------------------+  PERO                                                      Not well visualized   +--------+---------------+---------+-----------+----------+--------------------+     Summary: RIGHT: - There is no evidence of deep vein thrombosis in the lower  extremity. However, portions of this examination were limited- see technologist comments above.  - No cystic structure found in the popliteal fossa.  LEFT: - There is no evidence of deep vein thrombosis in the lower extremity. However, portions of this examination were limited- see technologist comments above.  - No cystic structure found in the popliteal fossa.  *See table(s) above for measurements and observations.    Preliminary      Assessment and Plan:   Acute PE with now on IV heparin.  Per IM AMS neg CT heard Recent CABG after  Inf STEMI X 4 (native saphenous vein graft to LAD, cryopreserved allograft saphenous vein graft to the posterior descending, sequential cryopreserved allograft vein graft to the OM1 and OM2).  Plavix indefinitely  for segment of cryovein used for bypass conduit.  Was euvolemic on discharge and has been on lasix 40 daily po but with increase of lower ext edema lungs clear.  Would give lasix 40 IV today then back to po - sh did miss her meds on Tuesday after discharge on Monday  Paf post op, on amiodarone will check hepatic and TSH.   HLD on crestor continue Post op anemia on iron-today 8.4 was 8.6 prior to discharge. Monitor on heparin Mobility issues 3 days post discharge form SNF for rehab per IM    Risk Assessment/Risk Scores:        New York Heart Association (NYHA) Functional Class NYHA Class II        For questions or updates, please contact [place] Please consult [email or web address] for contact info under    Signed, Cecilie Kicks, NP  11/26/2021 4:25 PM

## 2021-11-26 NOTE — Plan of Care (Signed)
Patient seems to be breathing a bit better than upon admit. Still feeling some difficulty with catching breath but feels and appears more comfortable in bed. On 5L oxygen via nasal cannula. Does express some anxiety/fear of falling asleep; states she is worried about all her health issues including. Assuring patient we are monitoring her and she needs the rest. Just notified of BP 72/52. Will page physician.  ? ?Problem: Education: ?Goal: Knowledge of General Education information will improve ?Description: Including pain rating scale, medication(s)/side effects and non-pharmacologic comfort measures ?Outcome: Progressing ?  ?Problem: Health Behavior/Discharge Planning: ?Goal: Ability to manage health-related needs will improve ?Outcome: Progressing ?  ?Problem: Clinical Measurements: ?Goal: Ability to maintain clinical measurements within normal limits will improve ?Outcome: Progressing ?Goal: Will remain free from infection ?Outcome: Progressing ?Goal: Diagnostic test results will improve ?Outcome: Progressing ?Goal: Respiratory complications will improve ?Outcome: Progressing ?Goal: Cardiovascular complication will be avoided ?Outcome: Progressing ?  ?Problem: Activity: ?Goal: Risk for activity intolerance will decrease ?Outcome: Progressing ?  ?Problem: Nutrition: ?Goal: Adequate nutrition will be maintained ?Outcome: Progressing ?  ?Problem: Coping: ?Goal: Level of anxiety will decrease ?Outcome: Progressing ?  ?Problem: Elimination: ?Goal: Will not experience complications related to bowel motility ?Outcome: Progressing ?Goal: Will not experience complications related to urinary retention ?Outcome: Progressing ?  ?Problem: Pain Managment: ?Goal: General experience of comfort will improve ?Outcome: Progressing ?  ?Problem: Safety: ?Goal: Ability to remain free from injury will improve ?Outcome: Progressing ?  ?Problem: Skin Integrity: ?Goal: Risk for impaired skin integrity will decrease ?Outcome: Progressing ?   ?

## 2021-11-26 NOTE — Progress Notes (Signed)
ANTICOAGULATION CONSULT NOTE ? ?Pharmacy Consult for Heparin ?Indication: pulmonary embolus ? ?Allergies  ?Allergen Reactions  ? Nsaids Nausea And Vomiting and Other (See Comments)  ?  GI Upset (ibuprofen included)  ? Ibuprofen Other (See Comments)  ?  Other reaction(s): Abdominal Pain  ? Tolmetin Other (See Comments)  ?  Other reaction(s): Abdominal Pain ?  ? ? ?Patient Measurements: ?Height: '4\' 11"'$  (149.9 cm) ?Weight: 111.8 kg (246 lb 7.6 oz) ?IBW/kg (Calculated) : 43.2 ?Heparin Dosing Weight: 72 kg ? ?Vital Signs: ?Temp: 99.3 ?F (37.4 ?C) (03/09 2003) ?Temp Source: Oral (03/09 2003) ?BP: 103/67 (03/09 2003) ?Pulse Rate: 79 (03/09 2003) ? ?Labs: ?Recent Labs  ?  11/26/21 ?1119 11/26/21 ?1310 11/26/21 ?2156  ?HGB 9.7*  --   --   ?HCT 30.8*  --   --   ?PLT 373  --   --   ?LABPROT  --  13.7  --   ?INR  --  1.1  --   ?HEPARINUNFRC  --   --  0.22*  ?CREATININE 1.17*  --   --   ?TROPONINIHS 54* 50*  --   ? ? ? ?Estimated Creatinine Clearance: 47.7 mL/min (A) (by C-G formula based on SCr of 1.17 mg/dL (H)). ? ? ?Medical History: ?Past Medical History:  ?Diagnosis Date  ? Adverse effect of anesthetic 01/2012  ? difficulty waking up.  ? Adverse effect of unspecified anesthetic, initial encounter 08/2011  ? low o2 sats.  ? Anemia   ? Anxiety   ? Arthritis   ? Breast cancer ([place]) 02/16/2017  ? left breast  ? Chronic pain   ? Closed supracondylar fracture of right femur ([place]) 09/18/2012  ? Depression   ? Family history of breast cancer   ? GERD (gastroesophageal reflux disease)   ? pt denies  ? Headache(784.0)   ? Hypertension   ? Hypothyroidism   ? Opioid dependence in controlled environment [facility])   ? Osteoporosis   ? Osteoporosis with fracture 09/19/2012  ? Pneumonia   ? Thyroid disease   ? ? ?Medications:  ?Medications Prior to Admission  ?Medication Sig Dispense Refill Last Dose  ? acetaminophen (TYLENOL) 325 MG tablet Take 2 tablets (650 mg total) by mouth every 6 (six) hours as needed for mild pain or moderate pain.    Past Week  ? amiodarone (PACERONE) 200 MG tablet Take 1 tablet (200 mg total) by mouth daily.   11/25/2021  ? anastrozole (ARIMIDEX) 1 MG tablet TAKE 1 TABLET BY MOUTH  DAILY (Patient taking differently: Take 1 mg by mouth daily.) 90 tablet 3 11/25/2021  ? aspirin EC 81 MG EC tablet Take 1 tablet (81 mg total) by mouth daily. Swallow whole. 30 tablet 11 11/25/2021  ? B Complex Vitamins (B COMPLEX 100 PO) Take 1 tablet by mouth daily.   11/25/2021  ? Biotin 10000 MCG TABS Take 10,000 mcg by mouth daily.   11/25/2021  ? buPROPion (WELLBUTRIN XL) 300 MG 24 hr tablet Take 300 mg by mouth daily.   11/25/2021  ? Calcium Carbonate (CALCIUM 600 PO) Take 600 mg by mouth in the morning and at bedtime.   11/25/2021  ? carbamazepine (TEGRETOL XR) 200 MG 12 hr tablet Take 400 mg by mouth 2 (two) times daily.   11/25/2021  ? cholecalciferol (VITAMIN D) 400 units TABS tablet Take 400 Units by mouth daily.   11/25/2021  ? clopidogrel (PLAVIX) 75 MG tablet Take 1 tablet (75 mg total) by mouth daily. Do not stop unless  directed by cardiology or cardiothoracic surgery.   11/26/2021  ? cyclobenzaprine (FLEXERIL) 10 MG tablet Take 1 tablet (10 mg total) by mouth 3 (three) times daily as needed for muscle spasms. 21 tablet 0 11/26/2021  ? DULoxetine (CYMBALTA) 30 MG capsule Take 30 mg by mouth 2 (two) times daily.   11/25/2021  ? ferrous sulfate 325 (65 FE) MG EC tablet Take 325 mg by mouth daily with breakfast.   11/25/2021  ? furosemide (LASIX) 40 MG tablet Take 1 tablet (40 mg total) by mouth daily.   11/26/2021  ? gabapentin (NEURONTIN) 100 MG capsule Take 2 capsules (200 mg total) by mouth 3 (three) times daily.   11/25/2021  ? latanoprost (XALATAN) 0.005 % ophthalmic solution Place 1 drop into both eyes at bedtime.   11/25/2021  ? levothyroxine (SYNTHROID) 88 MCG tablet Take 88 mcg by mouth daily.   11/26/2021  ? liothyronine (CYTOMEL) 25 MCG tablet Take 25 mcg by mouth daily.   11/26/2021  ? metoprolol succinate (TOPROL-XL) 25 MG 24 hr tablet Take 1 tablet (25 mg  total) by mouth daily.   11/26/2021 at 0800  ? Multiple Vitamins-Iron (MULTIVITAMIN/IRON PO) Take 1 tablet by mouth daily.    11/25/2021  ? mupirocin ointment (BACTROBAN) 2 % Apply 1 application topically 3 (three) times daily as needed for rash or irritation.   Past Week  ? ondansetron (ZOFRAN) 4 MG tablet Take 1 tablet (4 mg total) by mouth every 8 (eight) hours as needed for nausea or vomiting. 10 tablet 0 Past Month  ? oxyCODONE-acetaminophen (PERCOCET) 10-325 MG tablet Take 1 tablet by mouth 4 (four) times daily.   11/26/2021  ? pantoprazole (PROTONIX) 40 MG tablet Take 1 tablet (40 mg total) by mouth daily.   11/25/2021  ? polyethylene glycol (MIRALAX / GLYCOLAX) 17 g packet Take 17 g by mouth 2 (two) times daily. (Patient taking differently: Take 17 g by mouth daily.) 14 each 0 11/26/2021  ? potassium chloride SA (KLOR-CON M) 20 MEQ tablet Take 1 tablet (20 mEq total) by mouth daily.   11/25/2021  ? QUEtiapine (SEROQUEL) 100 MG tablet Take 2 tablets (200 mg total) by mouth at bedtime.   11/25/2021  ? rosuvastatin (CRESTOR) 10 MG tablet Take 1 tablet (10 mg total) by mouth daily.   11/26/2021  ? simethicone (MYLICON) 80 MG chewable tablet Chew 80 mg by mouth every 6 (six) hours as needed for flatulence.   11/25/2021  ? TRINTELLIX 20 MG TABS tablet Take 20 mg by mouth daily.   11/25/2021  ? ? ?Scheduled:  ?Infusions:  ?PRN:  ? ?Assessment: ?72 yof with a history of three vessel CAD s/p CABG last month, anemia, anxiety, breast cancer, chronic pain, depression, HTN, hypothyroidism, osteoporosis, pna. Patient is presenting with SOB. Heparin per pharmacy consult placed for pulmonary embolus.  EDP consult CT surg who is ok with heparin from their perspective. ? ?CTA PE w/ small segmental acute PE in rt upper and lower lobe. Patient is not on anticoagulation prior to arrival. ? ?Hgb 9.7 - BL; plt 373 ? ?Initial heparin level is subtherapeutic at 0.22. Per RN there have been no issues with infusion or bleeding. ? ?Goal of Therapy:   ?Heparin level 0.3-0.7 units/ml ?Monitor platelets by anticoagulation protocol: Yes ?  ?Plan:  ?Give 1000 units bolus x 1 ?Increase heparin infusion at 1350 units/hr ?Check anti-Xa level in 6-8 hours and daily while on heparin ?Continue to monitor H&H and platelets ? ? ?Thank you for allowing  [place] to participate in this patients care. ?Jens Som, PharmD ?11/26/2021 10:32 PM ? ?**Pharmacist phone directory can be found on [email or web address] listed under [place]** ? ?

## 2021-11-26 NOTE — H&P (Addendum)
History and Physical    MITZIE MARLAR JFH:[id number] DOB: 1951-04-07 DOA: 11/26/2021  PCP: Rich Fuchs, PA  Patient coming from: Home.  Chief Complaint: Shortness of breath and weakness.  HPI: Deborah Cooke is a 71 y.o. female with history of recent history of MI status post CABG on October 27, 2021 was discharged to rehab and was discharged from rehab 4 days ago was getting increasingly difficult to ambulate with shortness of breath weakness.  Patient also noticed increasing lower extremity edema.  Patient's son noticed that patient at times had been having some myoclonic jerks of all extremities.  ED Course: In the ER CT angiogram of the chest shows small segmental right upper lobe and lower lobe pulmonary embolism.  Prominent pulmonary artery.  Mosaic pattern of the lung concerning for edema versus bronchiolitis obliterans.  Patient was started on heparin.  Dopplers were negative for DVT.  Cardiology was consulted.  Patient admitted for further management of acute pulm embolism.  Review of Systems: As per HPI, rest all negative.   Past Medical History:  Diagnosis Date   Adverse effect of anesthetic 01/2012   difficulty waking up.   Adverse effect of unspecified anesthetic, initial encounter 08/2011   low o2 sats.   Anemia    Anxiety    Arthritis    Breast cancer ([facility]) 02/16/2017   left breast   Chronic pain    Closed supracondylar fracture of right femur ([place]) 09/18/2012   Depression    Family history of breast cancer    GERD (gastroesophageal reflux disease)    pt denies   Headache(784.0)    Hypertension    Hypothyroidism    Opioid dependence in controlled environment ([place])    Osteoporosis    Osteoporosis with fracture 09/19/2012   Pneumonia    Thyroid disease     Past Surgical History:  Procedure Laterality Date    OPEN REDUCTION INTERNAL FIXATION (ORIF) DISTAL FEMUR FRACTURE (Left Leg Upper)  10/15/2020   BACK SURGERY     fusion mid back, lumbar and thoracic    BREAST BIOPSY Left 02/16/2017   BREAST LUMPECTOMY WITH RADIOACTIVE SEED AND SENTINEL LYMPH NODE BIOPSY Left 03/15/2017   Procedure: LEFT BREAST LUMPECTOMY WITH RADIOACTIVE SEED AND LEFT AXILLARY SENTINEL LYMPH NODE BIOPSY;  Surgeon: [name] Overall, MD;  Location: [facility];  Service: General;  Laterality: Left;   CHOLECYSTECTOMY  10/1975   CORONARY ARTERY BYPASS GRAFT N/A 10/27/2021   Procedure: CORONARY ARTERY BYPASS GRAFTING (CABG) TIMES FOUR, USING LEFT INTERNAL MAMMARY ARTERY AND RIGHT LEG GREATER SAPHENOUS VEIN HARVESTED ENDOSCOPICALLY AND RIGHT CORONARY THROMBECTOMY;  Surgeon: Dahlia Byes, MD;  Location: [facility];  Service: Open Heart Surgery;  Laterality: N/A;   CORONARY/GRAFT ACUTE MI REVASCULARIZATION N/A 10/26/2021   Procedure: Coronary/Graft Acute MI Revascularization;  Surgeon: Jettie Booze, MD;  Location: [facility];  Service: Cardiovascular;  Laterality: N/A;   ENDOVEIN HARVEST OF GREATER SAPHENOUS VEIN Right 10/27/2021   Procedure: ENDOVEIN HARVEST OF GREATER SAPHENOUS VEIN;  Surgeon: Dahlia Byes, MD;  Location: [facility];  Service: Open Heart Surgery;  Laterality: Right;   FEMUR IM NAIL  09/18/2012   Procedure: INTRAMEDULLARY (IM) RETROGRADE FEMORAL NAILING;  Surgeon: [name], MD;  Location: [facility];  Service: Orthopedics;  Laterality: Right;   GASTRIC BYPASS  2003   IABP INSERTION N/A 10/26/2021   Procedure: IABP Insertion;  Surgeon: Jettie Booze, MD;  Location: [facility];  Service: Cardiovascular;  Laterality: N/A;  JOINT REPLACEMENT     LEFT HEART CATH AND CORONARY ANGIOGRAPHY N/A 10/26/2021   Procedure: LEFT HEART CATH AND CORONARY ANGIOGRAPHY;  Surgeon: Jettie Booze, MD;  Location: [facility];  Service: Cardiovascular;  Laterality: N/A;   NECK SURGERY  2000   ORIF FEMUR FRACTURE Left 10/15/2020   Procedure: OPEN REDUCTION INTERNAL FIXATION (ORIF) DISTAL FEMUR FRACTURE;  Surgeon: Shona Needles, MD;  Location: [facility];  Service: Orthopedics;  Laterality: Left;   REPLACEMENT TOTAL KNEE  2003   right and left -4 months aoart.   REVERSE SHOULDER ARTHROPLASTY Right 10/20/2021   Procedure: REVERSE SHOULDER ARTHROPLASTY;  Surgeon: Marchia Bond, MD;  Location: [facility];  Service: Orthopedics;  Laterality: Right;   TEE WITHOUT CARDIOVERSION N/A 10/27/2021   Procedure: TRANSESOPHAGEAL ECHOCARDIOGRAM (TEE);  Surgeon: Dahlia Byes, MD;  Location: [facility];  Service: Open Heart Surgery;  Laterality: N/A;   TEMPORARY PACEMAKER N/A 10/26/2021   Procedure: TEMPORARY PACEMAKER;  Surgeon: Jettie Booze, MD;  Location: [facility];  Service: Cardiovascular;  Laterality: N/A;   TONSILLECTOMY     WRIST FRACTURE SURGERY Bilateral 08/2011     reports that she quit smoking about 32 years ago. Her smoking use included cigarettes. She has a 28.00 pack-year smoking history. She quit smokeless tobacco use about 32 years ago. She reports current alcohol use. She reports that she does not use drugs.  Allergies  Allergen Reactions   Nsaids Nausea And Vomiting and Other (See Comments)    GI Upset (ibuprofen included)   Ibuprofen Other (See Comments)    Other reaction(s): Abdominal Pain   Tolmetin Other (See Comments)    Other reaction(s): Abdominal Pain     Family History  Problem Relation Age of Onset   Breast cancer Mother 29       died at 42   Cancer Father 56       uretherial cancer     Prior to Admission medications   Medication Sig Start Date End Date Taking? Authorizing Provider  acetaminophen (TYLENOL) 325 MG tablet Take 2 tablets (650 mg total) by mouth every 6 (six) hours as needed for mild pain or moderate pain. 11/11/21   Antony Odea, PA-C  amiodarone (PACERONE) 200 MG tablet Take 1 tablet (200 mg total) by mouth daily. 11/11/21   Antony Odea, PA-C  anastrozole (ARIMIDEX) 1 MG tablet TAKE 1 TABLET BY MOUTH  DAILY Patient taking differently: Take 1 mg by mouth daily. 08/21/21   Truitt Merle, MD  aspirin EC 81 MG EC tablet Take 1 tablet (81 mg total) by mouth daily. Swallow whole. 11/11/21   Antony Odea, PA-C  B Complex Vitamins (B COMPLEX 100 PO) Take 1 tablet by mouth daily.    [provider]  Biotin 10000 MCG TABS Take 10,000 mcg by mouth daily.    [provider]  buPROPion (WELLBUTRIN XL) 300 MG 24 hr tablet Take 300 mg by mouth daily. 06/09/21   [provider]  Calcium Carbonate (CALCIUM 600 PO) Take 600 mg by mouth in the morning and at bedtime.    [provider]  carbamazepine (TEGRETOL XR) 200 MG 12 hr tablet Take 400 mg by mouth 2 (two) times daily. 07/26/18 12/26/21  [provider]  cholecalciferol (VITAMIN D) 400 units TABS tablet Take 400 Units by mouth daily.    [provider]  clopidogrel (PLAVIX) 75 MG tablet Take 1 tablet (75 mg total) by mouth daily. Do  not stop unless directed by cardiology or cardiothoracic surgery. 11/11/21   Antony Odea, PA-C  cyclobenzaprine (FLEXERIL) 10 MG tablet Take 1 tablet (10 mg total) by mouth 3 (three) times daily as needed for muscle spasms. 10/17/20   Corinne Ports, PA-C  DULoxetine (CYMBALTA) 30 MG capsule Take 30 mg by mouth 2 (two) times daily.    [provider]  ferrous sulfate 325 (65 FE) MG EC tablet Take 325 mg by mouth daily with breakfast.    [provider]  furosemide (LASIX) 40 MG tablet Take 1 tablet (40 mg total) by mouth daily. 11/11/21   Antony Odea, PA-C  gabapentin (NEURONTIN) 100 MG capsule Take 2 capsules (200 mg total) by mouth 3 (three) times daily. 11/11/21   Antony Odea, PA-C  latanoprost (XALATAN) 0.005 % ophthalmic solution Place 1 drop into both eyes at bedtime. 06/25/16   [provider]  levothyroxine (SYNTHROID) 88 MCG tablet Take 88 mcg by mouth daily.    [provider]  liothyronine (CYTOMEL) 25 MCG tablet Take 25 mcg by mouth daily. 10/28/21   [provider]   metoprolol succinate (TOPROL-XL) 25 MG 24 hr tablet Take 1 tablet (25 mg total) by mouth daily. 11/11/21   Antony Odea, PA-C  Multiple Vitamins-Iron (MULTIVITAMIN/IRON PO) Take 1 tablet by mouth daily.     [provider]  mupirocin ointment (BACTROBAN) 2 % Apply 1 application topically 3 (three) times daily as needed for rash or irritation. 10/10/21   [provider]  ondansetron (ZOFRAN) 4 MG tablet Take 1 tablet (4 mg total) by mouth every 8 (eight) hours as needed for nausea or vomiting. 10/20/21   Merlene Pulling K, PA-C  pantoprazole (PROTONIX) 40 MG tablet Take 1 tablet (40 mg total) by mouth daily. 11/12/21   Antony Odea, PA-C  polyethylene glycol (MIRALAX / GLYCOLAX) 17 g packet Take 17 g by mouth 2 (two) times daily. Patient taking differently: Take 17 g by mouth daily. 10/21/20   Patrecia Pour, MD  potassium chloride SA (KLOR-CON M) 20 MEQ tablet Take 1 tablet (20 mEq total) by mouth daily. 11/11/21   Antony Odea, PA-C  QUEtiapine (SEROQUEL) 100 MG tablet Take 2 tablets (200 mg total) by mouth at bedtime. 11/11/21   Antony Odea, PA-C  rosuvastatin (CRESTOR) 10 MG tablet Take 1 tablet (10 mg total) by mouth daily. 11/11/21   Antony Odea, PA-C  simethicone (MYLICON) 80 MG chewable tablet Chew 80 mg by mouth every 6 (six) hours as needed for flatulence.    [provider]    Physical Exam: Constitutional: Moderately built and nourished. Vitals:   11/26/21 1234 11/26/21 1235 11/26/21 1240 11/26/21 1434  BP:  93/77    Pulse: 82 87 76   Resp: '20 16 20   '$ Temp:      TempSrc:      SpO2: 100% (!) 89% 90%   Weight:    111.1 kg  Height:    '4\' 11"'$  (1.499 m)   Eyes: Anicteric no pallor. ENMT: No discharge from the ears eyes nose and mouth. Neck: No mass felt.  No neck rigidity. Respiratory: No rhonchi or crepitations. Cardiovascular: S1-S2 heard. Abdomen: Soft nontender bowel sound present. Musculoskeletal: Bilateral lower  extremity edema. Skin: No rash. Neurologic: Alert awake oriented time place and person.  Has difficulty moving right upper extremity due to recent shoulder surgery. Psychiatric: Appears normal.  Normal affect.   Labs on Admission: I  have personally reviewed following labs and imaging studies  CBC: Recent Labs  Lab 11/26/21 1119  WBC 7.3  NEUTROABS 5.2  HGB 9.7*  HCT 30.8*  MCV 99.0  PLT 431   Basic Metabolic Panel: Recent Labs  Lab 11/26/21 1119  NA 136  K 3.8  CL 96*  CO2 27  GLUCOSE 92  BUN 27*  CREATININE 1.17*  CALCIUM 8.8*  MG 2.1   GFR: Estimated Creatinine Clearance: 47.6 mL/min (A) (by C-G formula based on SCr of 1.17 mg/dL (H)). Liver Function Tests: No results for input(s): AST, ALT, ALKPHOS, BILITOT, PROT, ALBUMIN in the last 168 hours. No results for input(s): LIPASE, AMYLASE in the last 168 hours. No results for input(s): AMMONIA in the last 168 hours. Coagulation Profile: Recent Labs  Lab 11/26/21 1310  INR 1.1   Cardiac Enzymes: No results for input(s): CKTOTAL, CKMB, CKMBINDEX, TROPONINI in the last 168 hours. BNP (last 3 results) No results for input(s): PROBNP in the last 8760 hours. HbA1C: No results for input(s): HGBA1C in the last 72 hours. CBG: No results for input(s): GLUCAP in the last 168 hours. Lipid Profile: No results for input(s): CHOL, HDL, LDLCALC, TRIG, CHOLHDL, LDLDIRECT in the last 72 hours. Thyroid Function Tests: No results for input(s): TSH, T4TOTAL, FREET4, T3FREE, THYROIDAB in the last 72 hours. Anemia Panel: No results for input(s): VITAMINB12, FOLATE, FERRITIN, TIBC, IRON, RETICCTPCT in the last 72 hours. Urine analysis:    Component Value Date/Time   COLORURINE YELLOW 10/27/2021 1505   COLORURINE YELLOW 10/27/2021 1505   APPEARANCEUR HAZY (A) 10/27/2021 1505   APPEARANCEUR HAZY (A) 10/27/2021 1505   LABSPEC 1.026 10/27/2021 1505   LABSPEC 1.026 10/27/2021 1505   PHURINE 5.0 10/27/2021 1505   PHURINE 5.0  10/27/2021 1505   GLUCOSEU NEGATIVE 10/27/2021 1505   GLUCOSEU NEGATIVE 10/27/2021 1505   HGBUR SMALL (A) 10/27/2021 1505   HGBUR SMALL (A) 10/27/2021 1505   BILIRUBINUR NEGATIVE 10/27/2021 1505   BILIRUBINUR NEGATIVE 10/27/2021 1505   KETONESUR NEGATIVE 10/27/2021 1505   KETONESUR NEGATIVE 10/27/2021 1505   PROTEINUR 30 (A) 10/27/2021 1505   PROTEINUR 30 (A) 10/27/2021 1505   NITRITE NEGATIVE 10/27/2021 1505   NITRITE NEGATIVE 10/27/2021 1505   LEUKOCYTESUR NEGATIVE 10/27/2021 1505   LEUKOCYTESUR NEGATIVE 10/27/2021 1505   Sepsis Labs: '@LABRCNTIP'$ (procalcitonin:4,lacticidven:4) ) Recent Results (from the past 240 hour(s))  Resp Panel by RT-PCR (Flu A&B, Covid) Nasopharyngeal Swab     Status: None   Collection Time: 11/26/21 11:21 AM   Specimen: Nasopharyngeal Swab; Nasopharyngeal(NP) swabs in vial transport medium  Result Value Ref Range Status   SARS Coronavirus 2 by RT PCR NEGATIVE NEGATIVE Final    Comment: (NOTE) SARS-CoV-2 target nucleic acids are NOT DETECTED.  The SARS-CoV-2 RNA is generally detectable in upper respiratory specimens during the acute phase of infection. The lowest concentration of SARS-CoV-2 viral copies this assay can detect is 138 copies/mL. A negative result does not preclude SARS-Cov-2 infection and should not be used as the sole basis for treatment or other patient management decisions. A negative result may occur with  improper specimen collection/handling, submission of specimen other than nasopharyngeal swab, presence of viral mutation(s) within the areas targeted by this assay, and inadequate number of viral copies(<138 copies/mL). A negative result must be combined with clinical observations, patient history, and epidemiological information. The expected result is Negative.  Fact Sheet for Patients:  EntrepreneurPulse.com.au  Fact Sheet for Healthcare Providers:  IncredibleEmployment.be  This test is no  t yet  approved or cleared by the [place] and  has been authorized for detection and/or diagnosis of SARS-CoV-2 by FDA under an Emergency Use Authorization (EUA). This EUA will remain  in effect (meaning this test can be used) for the duration of the COVID-19 declaration under Section 564(b)(1) of the Act, 21 U.S.C.section 360bbb-3(b)(1), unless the authorization is terminated  or revoked sooner.       Influenza A by PCR NEGATIVE NEGATIVE Final   Influenza B by PCR NEGATIVE NEGATIVE Final    Comment: (NOTE) The Xpert Xpress SARS-CoV-2/FLU/RSV plus assay is intended as an aid in the diagnosis of influenza from Nasopharyngeal swab specimens and should not be used as a sole basis for treatment. Nasal washings and aspirates are unacceptable for Xpert Xpress SARS-CoV-2/FLU/RSV testing.  Fact Sheet for Patients: EntrepreneurPulse.com.au  Fact Sheet for Healthcare Providers: IncredibleEmployment.be  This test is not yet approved or cleared by the [place] FDA and has been authorized for detection and/or diagnosis of SARS-CoV-2 by FDA under an Emergency Use Authorization (EUA). This EUA will remain in effect (meaning this test can be used) for the duration of the COVID-19 declaration under Section 564(b)(1) of the Act, 21 U.S.C. section 360bbb-3(b)(1), unless the authorization is terminated or revoked.  Performed at [facility], [place] [address], [place], [place] [address]      Radiological Exams on Admission: CT Angio Chest PE W/Cm &/Or Wo Cm  Result Date: 11/26/2021 CLINICAL DATA:  Worsening shortness of breath today. CABG on 08/26/2022. Right shoulder total reverse arthroplasty 10/20/2021. Bilateral lower extremity edema. EXAM: CT ANGIOGRAPHY CHEST WITH CONTRAST TECHNIQUE: Multidetector CT imaging of the chest was performed using the standard protocol during bolus administration of intravenous contrast. Multiplanar CT image  reconstructions and MIPs were obtained to evaluate the vascular anatomy. RADIATION DOSE REDUCTION: This exam was performed according to the departmental dose-optimization program which includes automated exposure control, adjustment of the mA and/or kV according to patient size and/or use of iterative reconstruction technique. CONTRAST:  62m OMNIPAQUE IOHEXOL 350 MG/ML SOLN COMPARISON:  Multiple exams, including chest radiograph 11/26/2021 FINDINGS: Body habitus reduces diagnostic sensitivity and specificity. Metal artifact from spinal and shoulder hardware also reduces signal to noise ratio, metal artifact reduction processing was employed. Cardiovascular: Prominent main pulmonary artery at 4.5 cm in transverse diameter on image 139 series 8, compatible with pulmonary arterial hypertension. Right lower lobe posterior basal segmental acute pulmonary embolus observed on image 263, series 8. Suspected small segmental right upper lobe pulmonary embolus, image 151 series 8 and also separately on image 129 series 8. No lobar or larger pulmonary embolus identified. Coronary, aortic arch, and branch vessel atherosclerotic vascular disease. Prior CABG with continued edema along the anterior mediastinal soft tissues and around the sternum with a small pericardial effusion. Moderate cardiomegaly. Substantial mitral valve calcification and mild aortic valve calcifications. Mediastinum/Nodes: As noted above, there is continued edema in the anterior mediastinum around the sternum likely related to the patient's prior CABG. Edema tracks around the sternotomy site. 0.8 cm AP window lymph node on image 43 series 6, probably reactive. Lungs/Pleura: Mild mosaic attenuation in the lungs. There is likely some trace mucus in the trachea dependently. Left anterior subpleural reticulation, suggesting prior radiation therapy for left breast cancer. Trace left pleural effusion. Upper Abdomen: Previous gastric bypass. Musculoskeletal: Right  total shoulder arthroplasty. Lower cervical fusion with fixation hardware. Healing sternotomy. Subacute fractures of the right anterior second, third, fourth, fifth, sixth, and seventh ribs, with some early  healing response. Fractures of the left anterior third, fourth, fifth, sixth, and seventh ribs are more age indeterminate and could be chronic. Likely chronic superior endplate compression at L1. Review of the MIP images confirms the above findings. IMPRESSION: 1. Small segmental acute pulmonary emboli in the right upper lobe and right lower lobe pulmonary artery. 2. Prominent main pulmonary artery at 4.5 cm in transverse diameter indicating pulmonary arterial hypertension. Underlying cardiomegaly and coronary atherosclerosis noted. 3. Recent CABG with healing median sternotomy and continued edema adjacent to the sternotomy site and anterior mediastinum. Small pericardial effusion. 4. Mild mosaic attenuation in the lungs, potentially reflecting a component of mild edema or bronchiolitis obliterans. 5. Subacute fractures of the right anterior second through seventh ribs. Likely older fractures of the left anterior third through seventh ribs. 6. Other imaging findings of potential clinical significance: Trace left pleural effusion. Radiation therapy related findings anteriorly in the left lung. Prior gastric bypass. Mitral valve and aortic valve calcification. Critical Value/emergent results were called by telephone at the time of interpretation on 11/26/2021 at 2:05 pm to provider [facility] , who verbally acknowledged these results. Electronically Signed   By: Van Clines M.D.   On: 11/26/2021 14:12   DG Chest Port 1 View  Result Date: 11/26/2021 CLINICAL DATA:  Provided history: Shortness of breath. EXAM: PORTABLE CHEST 1 VIEW COMPARISON:  Prior chest radiographs 11/06/2021 and earlier. FINDINGS: Prior median sternotomy/CABG. Unchanged cardiomegaly. No appreciable airspace consolidation or pulmonary  edema. No evidence of pleural effusion or pneumothorax. No acute bony abnormality identified. Prior right shoulder arthroplasty. Partially imaged cervical spinal fusion hardware. Surgical clips within the upper abdomen. IMPRESSION: No evidence of acute cardiopulmonary abnormality. Cardiomegaly. Electronically Signed   By: Kellie Simmering D.O.   On: 11/26/2021 11:38    EKG: Independently reviewed.  Normal sinus rhythm.  Assessment/Plan Principal Problem:   Pulmonary embolism (HCC) Active Problems:   Hypertension   Malignant neoplasm of upper-outer quadrant of left breast in female, estrogen receptor positive (HCC)   S/P CABG x 4    Acute pulmonary embolism presently hemodynamically stable likely unprovoked Dopplers are negative for DVT.  On heparin.  If patient continues to remain stable will change to oral anticoagulants. Myoclonic jerks and changes in mental status occasionally.  Will check Tegretol levels check EEG CT head is unremarkable.  Check ABG. Postoperative anemia follow CBC.  On iron supplements. Postoperative A-fib presently on amiodarone. Patient CAD status post CABG presently on Plavix indefinitely for segment of CryoVein used for bypass conduit.  Patient is also on heparin. Bilateral lower extremity edema with recent 2D echo done in February 04/08/2022 showing EF of 60 to 65% with grade 2 diastolic dysfunction.  Appreciate cardiology input was given Lasix 40 mg IV followed p.o. daily. History of neuropathy on Tegretol.  Check Tegretol levels. History of breast cancer in remission on anastrozole. Recent right shoulder surgery with decreased mobility on the right shoulder. Mobility issues will need physical therapy consult. Hypothyroidism on Synthroid and T3. Acute renal failure -creatinine increased from 0.8-1.1 now.  If any further worsening may have to change medication doses including gabapentin.   DVT prophylaxis: Heparin. Code Status: Full code. Family Communication:  Patient's son at the bedside. Disposition Plan: To be determined. Consults called: Cardiology. Admission status: Observation.   Rise Patience MD [facility] Pager [phone].  If 7PM-7AM, please contact night-coverage [email or web address] Password [email or web address]  11/26/2021, 3:11 PM

## 2021-11-26 NOTE — ED Provider Notes (Signed)
[facility] EMERGENCY DEPARTMENT Provider Note   CSN: [id number] Arrival date & time: 11/26/21  1030     History  Chief Complaint  Patient presents with   Shortness of Breath    Deborah Cooke is a 71 y.o. female.  She had shoulder surgery at the beginning of last month followed by having a heart attack and getting four-vessel bypass.  She went to rehab and was just discharged from rehab 3 days ago.  She has been doing poorly at home.  Generally weak all over unsteady on her feet.  Has had multiple falls.  Uses walker.  Increased shortness of breath.  No cough abdominal pain vomiting diarrhea.  No urinary symptoms.  States hands are trembling and she cannot hold a cup of coffee or feed herself.  Son does not feel like she can manage at home.  Her husband is off for weeks at a time driving a truck.  The history is provided by the patient.  Shortness of Breath Severity:  Moderate Onset quality:  Gradual Duration:  3 days Timing:  Constant Progression:  Unchanged Chronicity:  New Relieved by:  Nothing Worsened by:  Activity Ineffective treatments:  Rest Associated symptoms: no abdominal pain, no chest pain, no cough, no fever, no headaches, no hemoptysis, no rash, no sore throat and no vomiting   Risk factors: recent surgery       Home Medications Prior to Admission medications   Medication Sig Start Date End Date Taking? Authorizing Provider  acetaminophen (TYLENOL) 325 MG tablet Take 2 tablets (650 mg total) by mouth every 6 (six) hours as needed for mild pain or moderate pain. 11/11/21   Antony Odea, PA-C  amiodarone (PACERONE) 200 MG tablet Take 1 tablet (200 mg total) by mouth daily. 11/11/21   Antony Odea, PA-C  anastrozole (ARIMIDEX) 1 MG tablet TAKE 1 TABLET BY MOUTH  DAILY Patient taking differently: Take 1 mg by mouth daily. 08/21/21   Truitt Merle, MD  aspirin EC 81 MG EC tablet Take 1 tablet (81 mg total) by mouth daily. Swallow whole.  11/11/21   Antony Odea, PA-C  B Complex Vitamins (B COMPLEX 100 PO) Take 1 tablet by mouth daily.    [provider]  Biotin 10000 MCG TABS Take 10,000 mcg by mouth daily.    [provider]  buPROPion (WELLBUTRIN XL) 300 MG 24 hr tablet Take 300 mg by mouth daily. 06/09/21   [provider]  Calcium Carbonate (CALCIUM 600 PO) Take 600 mg by mouth in the morning and at bedtime.    [provider]  carbamazepine (TEGRETOL XR) 200 MG 12 hr tablet Take 400 mg by mouth 2 (two) times daily. 07/26/18 12/26/21  [provider]  cholecalciferol (VITAMIN D) 400 units TABS tablet Take 400 Units by mouth daily.    [provider]  clopidogrel (PLAVIX) 75 MG tablet Take 1 tablet (75 mg total) by mouth daily. Do not stop unless directed by cardiology or cardiothoracic surgery. 11/11/21   Antony Odea, PA-C  cyclobenzaprine (FLEXERIL) 10 MG tablet Take 1 tablet (10 mg total) by mouth 3 (three) times daily as needed for muscle spasms. 10/17/20   Corinne Ports, PA-C  DULoxetine (CYMBALTA) 30 MG capsule Take 30 mg by mouth 2 (two) times daily.    [provider]  ferrous sulfate 325 (65 FE) MG EC tablet Take 325 mg by mouth daily with breakfast.    [provider]  furosemide (LASIX) 40 MG tablet Take 1 tablet (40 mg total) by mouth daily. 11/11/21   Antony Odea, PA-C  gabapentin (NEURONTIN) 100 MG capsule Take 2 capsules (200 mg total) by mouth 3 (three) times daily. 11/11/21   Antony Odea, PA-C  latanoprost (XALATAN) 0.005 % ophthalmic solution Place 1 drop into both eyes at bedtime. 06/25/16   [provider]  levothyroxine (SYNTHROID) 88 MCG tablet Take 88 mcg by mouth daily.    [provider]  liothyronine (CYTOMEL) 25 MCG tablet Take 25 mcg by mouth daily. 10/28/21   [provider]  metoprolol succinate (TOPROL-XL) 25 MG 24 hr tablet Take 1 tablet (25 mg total) by mouth daily. 11/11/21    Antony Odea, PA-C  Multiple Vitamins-Iron (MULTIVITAMIN/IRON PO) Take 1 tablet by mouth daily.     [provider]  mupirocin ointment (BACTROBAN) 2 % Apply 1 application topically 3 (three) times daily as needed for rash or irritation. 10/10/21   [provider]  ondansetron (ZOFRAN) 4 MG tablet Take 1 tablet (4 mg total) by mouth every 8 (eight) hours as needed for nausea or vomiting. 10/20/21   Merlene Pulling K, PA-C  pantoprazole (PROTONIX) 40 MG tablet Take 1 tablet (40 mg total) by mouth daily. 11/12/21   Antony Odea, PA-C  polyethylene glycol (MIRALAX / GLYCOLAX) 17 g packet Take 17 g by mouth 2 (two) times daily. Patient taking differently: Take 17 g by mouth daily. 10/21/20   Patrecia Pour, MD  potassium chloride SA (KLOR-CON M) 20 MEQ tablet Take 1 tablet (20 mEq total) by mouth daily. 11/11/21   Antony Odea, PA-C  QUEtiapine (SEROQUEL) 100 MG tablet Take 2 tablets (200 mg total) by mouth at bedtime. 11/11/21   Antony Odea, PA-C  rosuvastatin (CRESTOR) 10 MG tablet Take 1 tablet (10 mg total) by mouth daily. 11/11/21   Antony Odea, PA-C  simethicone (MYLICON) 80 MG chewable tablet Chew 80 mg by mouth every 6 (six) hours as needed for flatulence.    [provider]      Allergies    Nsaids, Ibuprofen, and Tolmetin    Review of Systems   Review of Systems  Constitutional:  Positive for fatigue. Negative for fever.  HENT:  Negative for sore throat.   Eyes:  Negative for visual disturbance.  Respiratory:  Positive for shortness of breath. Negative for cough and hemoptysis.   Cardiovascular:  Positive for leg swelling. Negative for chest pain.  Gastrointestinal:  Negative for abdominal pain and vomiting.  Genitourinary:  Negative for dysuria.  Musculoskeletal:  Positive for gait problem.  Skin:  Negative for rash.  Neurological:  Positive for tremors. Negative for headaches.   Physical Exam Updated Vital Signs BP  114/70 (BP Location: Right Arm)    Pulse 82    Temp 98.1 F (36.7 C) (Oral)    Resp 20    SpO2 100%  Physical Exam Vitals and nursing note reviewed.  Constitutional:      General: She is not in acute distress.    Appearance: She is well-developed. She is obese.  HENT:     Head: Normocephalic and atraumatic.  Eyes:     Conjunctiva/sclera: Conjunctivae normal.  Cardiovascular:     Rate and Rhythm: Normal rate and regular rhythm.     Heart sounds: No murmur heard. Pulmonary:     Effort: Accessory muscle usage present. No respiratory distress.     Breath sounds: Normal breath sounds.  Chest:     Comments: Healing midline sternal incision without signs of infection Abdominal:     Palpations: Abdomen is soft.     Tenderness: There is no abdominal tenderness. There is no guarding or rebound.  Musculoskeletal:        General: No swelling.     Cervical back: Neck supple.     Right lower leg: No tenderness. Edema present.     Left lower leg: No tenderness. Edema present.  Skin:    General: Skin is warm and dry.     Capillary Refill: Capillary refill takes less than 2 seconds.  Neurological:     General: No focal deficit present.     Mental Status: She is alert.    ED Results / Procedures / Treatments   Labs (all labs ordered are listed, but only abnormal results are displayed) Labs Reviewed  BASIC METABOLIC PANEL - Abnormal; Notable for the following components:      Result Value   Chloride 96 (*)    BUN 27 (*)    Creatinine, Ser 1.17 (*)    Calcium 8.8 (*)    GFR, Estimated 49 (*)    All other components within normal limits  CBC WITH DIFFERENTIAL/PLATELET - Abnormal; Notable for the following components:   RBC 3.11 (*)    Hemoglobin 9.7 (*)    HCT 30.8 (*)    RDW 16.2 (*)    All other components within normal limits  BRAIN NATRIURETIC PEPTIDE - Abnormal; Notable for the following components:   B Natriuretic Peptide 482.4 (*)    All other components within normal limits   HEPATIC FUNCTION PANEL - Abnormal; Notable for the following components:   Total Protein 6.1 (*)    Albumin 2.8 (*)    Alkaline Phosphatase 139 (*)    All other components within normal limits  TSH - Abnormal; Notable for the following components:   TSH 5.546 (*)    All other components within normal limits  TROPONIN I (HIGH SENSITIVITY) - Abnormal; Notable for the following components:   Troponin I (High Sensitivity) 54 (*)    All other components within normal limits  TROPONIN I (HIGH SENSITIVITY) - Abnormal; Notable for the following components:   Troponin I (High Sensitivity) 50 (*)    All other components within normal limits  RESP PANEL BY RT-PCR (FLU A&B, COVID) ARPGX2  MAGNESIUM  PROTIME-INR  LACTIC ACID, PLASMA  HEPARIN LEVEL (UNFRACTIONATED)  LACTIC ACID, PLASMA  HEPARIN LEVEL (UNFRACTIONATED)  COMPREHENSIVE METABOLIC PANEL  CBC  CARBAMAZEPINE LEVEL, TOTAL  BLOOD GAS, ARTERIAL    EKG EKG Interpretation  Date/Time:  Thursday November 26 2021 11:11:12 EST Ventricular Rate:  83 PR Interval:  203 QRS Duration: 115 QT Interval:  365 QTC Calculation: 429 R Axis:   59 Text Interpretation: Sinus rhythm Incomplete right bundle branch block Low voltage, extremity and precordial leads No significant change since last tracing Confirmed by Aletta Edouard [phone]) on 11/26/2021 11:14:17 AM  Radiology CT HEAD WO CONTRAST (5MM)  Result Date: 11/26/2021 CLINICAL DATA:  Altered mental status. EXAM: CT HEAD WITHOUT CONTRAST TECHNIQUE: Contiguous axial images were obtained from the base of the skull through the vertex without intravenous contrast. RADIATION DOSE REDUCTION: This exam was performed according to the departmental dose-optimization program which includes automated exposure control, adjustment of the mA and/or kV according to patient size and/or use of iterative reconstruction technique. COMPARISON:  March 13, 2020. FINDINGS: Brain: Mild diffuse cortical atrophy is noted. No  mass  effect or midline shift is noted. Ventricular size is within normal limits. There is no evidence of mass lesion, hemorrhage or acute infarction. Vascular: No hyperdense vessel or unexpected calcification. Skull: Normal. Negative for fracture or focal lesion. Sinuses/Orbits: No acute finding. Other: None. IMPRESSION: No acute intracranial abnormality seen. Electronically Signed   By: Marijo Conception M.D.   On: 11/26/2021 16:01   CT Angio Chest PE W/Cm &/Or Wo Cm  Result Date: 11/26/2021 CLINICAL DATA:  Worsening shortness of breath today. CABG on 08/26/2022. Right shoulder total reverse arthroplasty 10/20/2021. Bilateral lower extremity edema. EXAM: CT ANGIOGRAPHY CHEST WITH CONTRAST TECHNIQUE: Multidetector CT imaging of the chest was performed using the standard protocol during bolus administration of intravenous contrast. Multiplanar CT image reconstructions and MIPs were obtained to evaluate the vascular anatomy. RADIATION DOSE REDUCTION: This exam was performed according to the departmental dose-optimization program which includes automated exposure control, adjustment of the mA and/or kV according to patient size and/or use of iterative reconstruction technique. CONTRAST:  72m OMNIPAQUE IOHEXOL 350 MG/ML SOLN COMPARISON:  Multiple exams, including chest radiograph 11/26/2021 FINDINGS: Body habitus reduces diagnostic sensitivity and specificity. Metal artifact from spinal and shoulder hardware also reduces signal to noise ratio, metal artifact reduction processing was employed. Cardiovascular: Prominent main pulmonary artery at 4.5 cm in transverse diameter on image 139 series 8, compatible with pulmonary arterial hypertension. Right lower lobe posterior basal segmental acute pulmonary embolus observed on image 263, series 8. Suspected small segmental right upper lobe pulmonary embolus, image 151 series 8 and also separately on image 129 series 8. No lobar or larger pulmonary embolus identified.  Coronary, aortic arch, and branch vessel atherosclerotic vascular disease. Prior CABG with continued edema along the anterior mediastinal soft tissues and around the sternum with a small pericardial effusion. Moderate cardiomegaly. Substantial mitral valve calcification and mild aortic valve calcifications. Mediastinum/Nodes: As noted above, there is continued edema in the anterior mediastinum around the sternum likely related to the patient's prior CABG. Edema tracks around the sternotomy site. 0.8 cm AP window lymph node on image 43 series 6, probably reactive. Lungs/Pleura: Mild mosaic attenuation in the lungs. There is likely some trace mucus in the trachea dependently. Left anterior subpleural reticulation, suggesting prior radiation therapy for left breast cancer. Trace left pleural effusion. Upper Abdomen: Previous gastric bypass. Musculoskeletal: Right total shoulder arthroplasty. Lower cervical fusion with fixation hardware. Healing sternotomy. Subacute fractures of the right anterior second, third, fourth, fifth, sixth, and seventh ribs, with some early healing response. Fractures of the left anterior third, fourth, fifth, sixth, and seventh ribs are more age indeterminate and could be chronic. Likely chronic superior endplate compression at L1. Review of the MIP images confirms the above findings. IMPRESSION: 1. Small segmental acute pulmonary emboli in the right upper lobe and right lower lobe pulmonary artery. 2. Prominent main pulmonary artery at 4.5 cm in transverse diameter indicating pulmonary arterial hypertension. Underlying cardiomegaly and coronary atherosclerosis noted. 3. Recent CABG with healing median sternotomy and continued edema adjacent to the sternotomy site and anterior mediastinum. Small pericardial effusion. 4. Mild mosaic attenuation in the lungs, potentially reflecting a component of mild edema or bronchiolitis obliterans. 5. Subacute fractures of the right anterior second through  seventh ribs. Likely older fractures of the left anterior third through seventh ribs. 6. Other imaging findings of potential clinical significance: Trace left pleural effusion. Radiation therapy related findings anteriorly in the left lung. Prior gastric bypass. Mitral valve and aortic valve calcification. Critical Value/emergent results were  called by telephone at the time of interpretation on 11/26/2021 at 2:05 pm to provider [facility] , who verbally acknowledged these results. Electronically Signed   By: Van Clines M.D.   On: 11/26/2021 14:12   DG Chest Port 1 View  Result Date: 11/26/2021 CLINICAL DATA:  Provided history: Shortness of breath. EXAM: PORTABLE CHEST 1 VIEW COMPARISON:  Prior chest radiographs 11/06/2021 and earlier. FINDINGS: Prior median sternotomy/CABG. Unchanged cardiomegaly. No appreciable airspace consolidation or pulmonary edema. No evidence of pleural effusion or pneumothorax. No acute bony abnormality identified. Prior right shoulder arthroplasty. Partially imaged cervical spinal fusion hardware. Surgical clips within the upper abdomen. IMPRESSION: No evidence of acute cardiopulmonary abnormality. Cardiomegaly. Electronically Signed   By: Kellie Simmering D.O.   On: 11/26/2021 11:38   VAS [place] LOWER EXTREMITY VENOUS (DVT)  Result Date: 11/26/2021  Lower Venous DVT Study Patient Name:  Deborah Cooke  Date of Exam:   11/26/2021 Medical Rec #: [id number]     Accession #:    [id number] Date of Birth: [birth year]     Patient Gender: F Patient Age:   72 years Exam Location:  [facility] Procedure:      VAS [place] LOWER EXTREMITY VENOUS (DVT) Referring Phys: Gean Birchwood --------------------------------------------------------------------------------  Indications: Pulmonary embolism.  Limitations: Body habitus and poor ultrasound/tissue interface. Comparison Study: no prior Performing Technologist: Archie Patten RVS  Examination Guidelines: A complete evaluation includes B-mode  imaging, spectral Doppler, color Doppler, and power Doppler as needed of all accessible portions of each vessel. Bilateral testing is considered an integral part of a complete examination. Limited examinations for reoccurring indications may be performed as noted. The reflux portion of the exam is performed with the patient in reverse Trendelenburg.  +---------+---------------+---------+-----------+----------+-------------------+  RIGHT     Compressibility Phasicity Spontaneity Properties Thrombus Aging       +---------+---------------+---------+-----------+----------+-------------------+  CFV       Full            Yes       Yes                                         +---------+---------------+---------+-----------+----------+-------------------+  SFJ       Full                                                                  +---------+---------------+---------+-----------+----------+-------------------+  FV Prox   Full                                                                  +---------+---------------+---------+-----------+----------+-------------------+  FV Mid    Full                                                                  +---------+---------------+---------+-----------+----------+-------------------+  FV Distal Full            Yes       Yes                                         +---------+---------------+---------+-----------+----------+-------------------+  PFV       Full                                                                  +---------+---------------+---------+-----------+----------+-------------------+  POP       Full            Yes       Yes                                         +---------+---------------+---------+-----------+----------+-------------------+  PTV       Full                                                                  +---------+---------------+---------+-----------+----------+-------------------+  PERO                                                        Not well visualized  +---------+---------------+---------+-----------+----------+-------------------+   +--------+---------------+---------+-----------+----------+--------------------+  LEFT     Compressibility Phasicity Spontaneity Properties Thrombus Aging        +--------+---------------+---------+-----------+----------+--------------------+  CFV      Full            Yes       Yes                                          +--------+---------------+---------+-----------+----------+--------------------+  SFJ      Full                                                                   +--------+---------------+---------+-----------+----------+--------------------+  FV Prox  Full                                                                   +--------+---------------+---------+-----------+----------+--------------------+  FV Mid   Full                                                                   +--------+---------------+---------+-----------+----------+--------------------+  FV       Full            Yes       Yes                                           Distal                                                                          +--------+---------------+---------+-----------+----------+--------------------+  PFV      Full                                                                   +--------+---------------+---------+-----------+----------+--------------------+  POP      Full                                                                   +--------+---------------+---------+-----------+----------+--------------------+  PTV      Full                                             patent by color                                                                  doppler               +--------+---------------+---------+-----------+----------+--------------------+  PERO                                                      Not well visualized    +--------+---------------+---------+-----------+----------+--------------------+     Summary: RIGHT: - There is no evidence of deep vein thrombosis in the lower extremity. However, portions of this examination were limited- see technologist comments above.  - No cystic structure found in the popliteal fossa.  LEFT: - There is no evidence of deep vein thrombosis in the lower extremity. However, portions of this examination were limited- see technologist comments above.  - No cystic structure found in the popliteal fossa.  *See table(s) above for measurements and observations.    Preliminary     Procedures .Critical Care Performed by: Hayden Rasmussen, MD Authorized by: Hayden Rasmussen, MD  Critical care provider statement:    Critical care time (minutes):  45   Critical care time was exclusive of:  Separately billable procedures and treating other patients   Critical care was necessary to treat or prevent imminent or life-threatening deterioration of the following conditions:  Respiratory failure   Critical care was time spent personally by me on the following activities:  Development of treatment plan with patient or surrogate, discussions with consultants, evaluation of patient's response to treatment, examination of patient, obtaining history from patient or surrogate, ordering and performing treatments and interventions, ordering and review of laboratory studies, ordering and review of radiographic studies, pulse oximetry, re-evaluation of patient's condition and review of old charts   I assumed direction of critical care for this patient from another provider in my specialty: no      Medications Ordered in ED Medications  heparin ADULT infusion 100 units/mL (25000 units/258m) (1,200 Units/hr Intravenous Handoff 11/26/21 1930)  anastrozole (ARIMIDEX) tablet 1 mg (has no administration in time range)  amiodarone (PACERONE) tablet 200 mg (has no administration in time range)  metoprolol  succinate (TOPROL-XL) 24 hr tablet 25 mg (has no administration in time range)  rosuvastatin (CRESTOR) tablet 10 mg (has no administration in time range)  buPROPion (WELLBUTRIN XL) 24 hr tablet 300 mg (has no administration in time range)  DULoxetine (CYMBALTA) DR capsule 30 mg (has no administration in time range)  QUEtiapine (SEROQUEL) tablet 200 mg (has no administration in time range)  vortioxetine HBr (TRINTELLIX) tablet 20 mg (has no administration in time range)  levothyroxine (SYNTHROID) tablet 88 mcg (has no administration in time range)  liothyronine (CYTOMEL) tablet 25 mcg (has no administration in time range)  simethicone (MYLICON) chewable tablet 80 mg (has no administration in time range)  clopidogrel (PLAVIX) tablet 75 mg (has no administration in time range)  ferrous sulfate tablet 325 mg (has no administration in time range)  carbamazepine (TEGRETOL XR) 12 hr tablet 400 mg (has no administration in time range)  cyclobenzaprine (FLEXERIL) tablet 10 mg (has no administration in time range)  gabapentin (NEURONTIN) capsule 200 mg (has no administration in time range)  cholecalciferol (VITAMIN D3) tablet 400 Units (has no administration in time range)  potassium chloride SA (KLOR-CON M) CR tablet 20 mEq (has no administration in time range)  latanoprost (XALATAN) 0.005 % ophthalmic solution 1 drop (has no administration in time range)  iohexol (OMNIPAQUE) 350 MG/ML injection 50 mL (50 mLs Intravenous Contrast Given 11/26/21 1340)  heparin bolus via infusion 4,500 Units (4,500 Units Intravenous Bolus from Bag 11/26/21 1450)  furosemide (LASIX) injection 40 mg (40 mg Intravenous Given 11/26/21 1756)    ED Course/ Medical Decision Making/ A&P Clinical Course as of 11/26/21 2016  Thu Nov 26, 2021  1142 Chest x-ray interpreted by me as cardiomegaly no gross infiltrates.  Awaiting radiology reading. [MB]  1407 Received a call from radiology that patient has a acute PE. [MB]  1410 Discussed  with cardiology, they will see in consult. [MB]  138discussed with [facility] hospitalist who will evaluate the patient for admission. [MB]  [id number] with CT surgery PA ZTacy Dura okay to initiate heparin and transition over to oral agent [MB]    Clinical Course User Index [MB] BHayden Rasmussen MD                           Medical Decision Making Amount and/or Complexity of Data Reviewed Labs: ordered. Radiology: ordered.  Risk Prescription drug management. Decision regarding hospitalization.  Deborah Cooke was evaluated in Emergency Department on 11/26/2021 for the symptoms described in the history of present illness. She was evaluated in the context of the global COVID-19 pandemic, which necessitated consideration that the patient might be at risk for infection with the SARS-CoV-2 virus that causes COVID-19. Institutional protocols and algorithms that pertain to the evaluation of patients at risk for COVID-19 are in a state of rapid change based on information released by regulatory bodies including the CDC and federal and state organizations. These policies and algorithms were followed during the patient's care in the ED.  This patient complains of generalized weakness frequent falls trauma or shortness of breath; this involves an extensive number of treatment Options and is a complaint that carries with it a high risk of complications and morbidity. The differential includes PE, DVT, CHF, pericardial effusion, ACS, anemia, metabolic derangement, failure to thrive  I ordered, reviewed and interpreted labs, which included CBC with normal white count, hemoglobin low stable from priors, chemistries with elevated BUN and creatinine, troponins elevated but flat, BNP elevated no priors, lactate normal, COVID and flu negative I ordered medication IV heparin and reviewed PMP when indicated. I ordered imaging studies which included chest x-ray and CT angio chest and I independently     visualized and interpreted imaging which showed acute PE Additional history obtained from patient's son Previous records obtained and reviewed in epic including recent discharge summary I consulted [facility] cardiology and [facility] hospitalist Dr. Hal Hope and discussed lab and imaging findings and discussed disposition.  Cardiac monitoring reviewed, normal sinus rhythm Social determinants considered, patient has limited mobility at this time. Critical Interventions: Initiation of heparin for pulmonary embolism  After the interventions stated above, I reevaluated the patient and found still to be dyspneic at rest Admission and further testing considered, she will need for admission to the hospital for further management of her PE and further intervention by cardiology regarding possible diuresis.  Patient in agreement with plan.  Patient also likely will need a SNF before able to return home          Final Clinical Impression(s) / ED Diagnoses Final diagnoses:  Acute pulmonary embolism without acute cor pulmonale, unspecified pulmonary embolism type ([place])  Generalized weakness  Frequent falls    Rx / DC Orders ED Discharge Orders     None         Hayden Rasmussen, MD 11/26/21 2022

## 2021-11-26 NOTE — Progress Notes (Signed)
Lower extremity venous has been completed.  ? ?Preliminary results in CV Proc.  ? ?Deborah Cooke ?11/26/2021 3:58 PM    ?

## 2021-11-26 NOTE — Progress Notes (Signed)
Stat  EEG complete - results pending.  

## 2021-11-27 ENCOUNTER — Other Ambulatory Visit (HOSPITAL_COMMUNITY): Payer: Self-pay

## 2021-11-27 DIAGNOSIS — I50813 Acute on chronic right heart failure: Secondary | ICD-10-CM | POA: Diagnosis not present

## 2021-11-27 DIAGNOSIS — I110 Hypertensive heart disease with heart failure: Secondary | ICD-10-CM | POA: Diagnosis present

## 2021-11-27 DIAGNOSIS — G8929 Other chronic pain: Secondary | ICD-10-CM

## 2021-11-27 DIAGNOSIS — Z9884 Bariatric surgery status: Secondary | ICD-10-CM | POA: Diagnosis not present

## 2021-11-27 DIAGNOSIS — I272 Pulmonary hypertension, unspecified: Secondary | ICD-10-CM

## 2021-11-27 DIAGNOSIS — G253 Myoclonus: Secondary | ICD-10-CM | POA: Diagnosis present

## 2021-11-27 DIAGNOSIS — M810 Age-related osteoporosis without current pathological fracture: Secondary | ICD-10-CM | POA: Diagnosis present

## 2021-11-27 DIAGNOSIS — I480 Paroxysmal atrial fibrillation: Secondary | ICD-10-CM

## 2021-11-27 DIAGNOSIS — E039 Hypothyroidism, unspecified: Secondary | ICD-10-CM | POA: Diagnosis present

## 2021-11-27 DIAGNOSIS — R531 Weakness: Secondary | ICD-10-CM | POA: Diagnosis not present

## 2021-11-27 DIAGNOSIS — E785 Hyperlipidemia, unspecified: Secondary | ICD-10-CM | POA: Diagnosis present

## 2021-11-27 DIAGNOSIS — Z6841 Body Mass Index (BMI) 40.0 and over, adult: Secondary | ICD-10-CM | POA: Diagnosis not present

## 2021-11-27 DIAGNOSIS — Z170 Estrogen receptor positive status [ER+]: Secondary | ICD-10-CM

## 2021-11-27 DIAGNOSIS — Z951 Presence of aortocoronary bypass graft: Secondary | ICD-10-CM

## 2021-11-27 DIAGNOSIS — M1990 Unspecified osteoarthritis, unspecified site: Secondary | ICD-10-CM | POA: Diagnosis present

## 2021-11-27 DIAGNOSIS — Z96611 Presence of right artificial shoulder joint: Secondary | ICD-10-CM

## 2021-11-27 DIAGNOSIS — I959 Hypotension, unspecified: Secondary | ICD-10-CM | POA: Diagnosis not present

## 2021-11-27 DIAGNOSIS — I50810 Right heart failure, unspecified: Secondary | ICD-10-CM | POA: Diagnosis present

## 2021-11-27 DIAGNOSIS — I10 Essential (primary) hypertension: Secondary | ICD-10-CM

## 2021-11-27 DIAGNOSIS — N179 Acute kidney failure, unspecified: Secondary | ICD-10-CM

## 2021-11-27 DIAGNOSIS — C50412 Malignant neoplasm of upper-outer quadrant of left female breast: Secondary | ICD-10-CM | POA: Diagnosis present

## 2021-11-27 DIAGNOSIS — Z20822 Contact with and (suspected) exposure to covid-19: Secondary | ICD-10-CM | POA: Diagnosis present

## 2021-11-27 DIAGNOSIS — I2699 Other pulmonary embolism without acute cor pulmonale: Secondary | ICD-10-CM | POA: Diagnosis present

## 2021-11-27 DIAGNOSIS — Z981 Arthrodesis status: Secondary | ICD-10-CM | POA: Diagnosis not present

## 2021-11-27 DIAGNOSIS — R600 Localized edema: Secondary | ICD-10-CM | POA: Diagnosis not present

## 2021-11-27 DIAGNOSIS — I2510 Atherosclerotic heart disease of native coronary artery without angina pectoris: Secondary | ICD-10-CM | POA: Diagnosis present

## 2021-11-27 DIAGNOSIS — I252 Old myocardial infarction: Secondary | ICD-10-CM | POA: Diagnosis not present

## 2021-11-27 DIAGNOSIS — D649 Anemia, unspecified: Secondary | ICD-10-CM | POA: Diagnosis present

## 2021-11-27 DIAGNOSIS — I2609 Other pulmonary embolism with acute cor pulmonale: Secondary | ICD-10-CM | POA: Diagnosis present

## 2021-11-27 DIAGNOSIS — G629 Polyneuropathy, unspecified: Secondary | ICD-10-CM | POA: Diagnosis present

## 2021-11-27 DIAGNOSIS — R296 Repeated falls: Secondary | ICD-10-CM | POA: Diagnosis present

## 2021-11-27 LAB — COMPREHENSIVE METABOLIC PANEL
ALT: 11 U/L (ref 0–44)
AST: 14 U/L — ABNORMAL LOW (ref 15–41)
Albumin: 2.4 g/dL — ABNORMAL LOW (ref 3.5–5.0)
Alkaline Phosphatase: 109 U/L (ref 38–126)
Anion gap: 8 (ref 5–15)
BUN: 24 mg/dL — ABNORMAL HIGH (ref 8–23)
CO2: 28 mmol/L (ref 22–32)
Calcium: 8.1 mg/dL — ABNORMAL LOW (ref 8.9–10.3)
Chloride: 101 mmol/L (ref 98–111)
Creatinine, Ser: 1.14 mg/dL — ABNORMAL HIGH (ref 0.44–1.00)
GFR, Estimated: 51 mL/min — ABNORMAL LOW (ref >60)
Glucose, Bld: 94 mg/dL (ref 70–99)
Potassium: 3.9 mmol/L (ref 3.5–5.1)
Sodium: 137 mmol/L (ref 135–145)
Total Bilirubin: 0.4 mg/dL (ref 0.3–1.2)
Total Protein: 5 g/dL — ABNORMAL LOW (ref 6.5–8.1)

## 2021-11-27 LAB — CBC
HCT: 25.8 % — ABNORMAL LOW (ref 36.0–46.0)
Hemoglobin: 8.3 g/dL — ABNORMAL LOW (ref 12.0–15.0)
MCH: 31.3 pg (ref 26.0–34.0)
MCHC: 32.2 g/dL (ref 30.0–36.0)
MCV: 97.4 fL (ref 80.0–100.0)
Platelets: 304 10*3/uL (ref 150–400)
RBC: 2.65 MIL/uL — ABNORMAL LOW (ref 3.87–5.11)
RDW: 16 % — ABNORMAL HIGH (ref 11.5–15.5)
WBC: 6.4 10*3/uL (ref 4.0–10.5)
nRBC: 0 % (ref 0.0–0.2)

## 2021-11-27 LAB — HEPARIN LEVEL (UNFRACTIONATED)
Heparin Unfractionated: 0.27 IU/mL — ABNORMAL LOW (ref 0.30–0.70)
Heparin Unfractionated: 0.33 IU/mL (ref 0.30–0.70)

## 2021-11-27 MED ORDER — OXYCODONE-ACETAMINOPHEN 5-325 MG PO TABS
1.0000 | ORAL_TABLET | Freq: Four times a day (QID) | ORAL | Status: DC | PRN
  Administered 2021-11-28 – 2021-12-01 (×10): 2 via ORAL
  Filled 2021-11-27 (×10): qty 2

## 2021-11-27 MED ORDER — LACTATED RINGERS IV BOLUS
250.0000 mL | Freq: Once | INTRAVENOUS | Status: AC
  Administered 2021-11-27: 250 mL via INTRAVENOUS

## 2021-11-27 MED ORDER — OXYCODONE-ACETAMINOPHEN 5-325 MG PO TABS
1.0000 | ORAL_TABLET | Freq: Four times a day (QID) | ORAL | Status: DC | PRN
  Administered 2021-11-27 (×2): 1 via ORAL
  Filled 2021-11-27 (×2): qty 1

## 2021-11-27 NOTE — Assessment & Plan Note (Addendum)
Debility, deconditioning/ambulatory dysfunction at home though she was recently admitted and discharged from a rehabilitation facility.  Family unable to take care of her at home.  Patient was seen by physical therapy and therapy on 11/28/2021 with recommendations for acute inpatient rehab, recommendation now is to skilled nursing facility.

## 2021-11-27 NOTE — Assessment & Plan Note (Addendum)
Mild.  Improved at this time.  Creatinine of 0.6 at this time

## 2021-11-27 NOTE — Assessment & Plan Note (Addendum)
Likely secondary to sedentary status at home.  Lower extremity Dopplers were negative for DVT.  On Coumadin and Lovenox at this time.  Unable to do oral DOAC due to interaction with carbamazepine.  Patient would like to be on carbamazepine which helps her with her headache an would need taper slowly as outpatient.  Pharmacy managing.  INR of 1.1 today.  Continue dosing with Coumadin.

## 2021-11-27 NOTE — Assessment & Plan Note (Signed)
In remission.  On anastrozole. ?

## 2021-11-27 NOTE — Progress Notes (Signed)
PT Cancellation Note ? ?Patient Details ?Name: Deborah Cooke ?MRN: [id number] ?DOB: [birth year] ? ? ?Cancelled Treatment:    Reason Eval/Treat Not Completed: Patient not medically ready (pt with acute PE and not yet therapeutic on heparin and <24 on heparin. Per protocol will not initiate therapy at this time without additional medical clearance) ? ? ?Itay Mella B Bernadean Saling ?11/27/2021, 7:03 AM ?Srikar Chiang P, PT ?Acute Rehabilitation Services ?Pager: [phone] ?Office: [phone] ? ?

## 2021-11-27 NOTE — Assessment & Plan Note (Addendum)
Improvement of blood pressure at this time.  Was transiently hypotensive and required IV fluids.  On long-acting metoprolol at this time.  Plan for Lasix today.

## 2021-11-27 NOTE — TOC Benefit Eligibility Note (Signed)
Patient Advocate Encounter ?  ?Insurance verification completed.   ?  ?The patient is currently admitted and upon discharge could be taking ELIQUIS. ?  ?The current 30 day co-pay is, $47.  ? ?The patient is currently admitted and upon discharge could be taking XARELTO. ?  ?The current 30 day co-pay is, $47.  ? ? ?The patient is insured through [employer]. ? ? ?  ? ?

## 2021-11-27 NOTE — Assessment & Plan Note (Signed)
Continue supportive care.  Off sling recently. ?

## 2021-11-27 NOTE — Progress Notes (Signed)
ANTICOAGULATION CONSULT NOTE ? ?Pharmacy Consult for Heparin ?Indication: pulmonary embolus ? ?Allergies  ?Allergen Reactions  ? Nsaids Nausea And Vomiting and Other (See Comments)  ?  GI Upset (ibuprofen included)  ? Ibuprofen Other (See Comments)  ?  Other reaction(s): Abdominal Pain  ? Tolmetin Other (See Comments)  ?  Other reaction(s): Abdominal Pain ?  ? ? ?Patient Measurements: ?Height: '4\' 11"'$  (149.9 cm) ?Weight: 111.8 kg (246 lb 7.6 oz) ?IBW/kg (Calculated) : 43.2 ?Heparin Dosing Weight: 72 kg ? ?Vital Signs: ?Temp: 98.5 ?F (36.9 ?C) (03/09 2355) ?Temp Source: Oral (03/09 2355) ?BP: 89/51 (03/09 2355) ?Pulse Rate: 87 (03/09 2355) ? ?Labs: ?Recent Labs  ?  11/26/21 ?1119 11/26/21 ?1310 11/26/21 ?2156 11/27/21 ?0204  ?HGB 9.7*  --   --  8.3*  ?HCT 30.8*  --   --  25.8*  ?PLT 373  --   --  304  ?LABPROT  --  13.7  --   --   ?INR  --  1.1  --   --   ?HEPARINUNFRC  --   --  0.22* 0.27*  ?CREATININE 1.17*  --   --  1.14*  ?TROPONINIHS 54* 50*  --   --   ? ? ? ?Estimated Creatinine Clearance: 49 mL/min (A) (by C-G formula based on SCr of 1.14 mg/dL (H)). ? ? ?Medical History: ?Past Medical History:  ?Diagnosis Date  ? Adverse effect of anesthetic 01/2012  ? difficulty waking up.  ? Adverse effect of unspecified anesthetic, initial encounter 08/2011  ? low o2 sats.  ? Anemia   ? Anxiety   ? Arthritis   ? Breast cancer ([place]) 02/16/2017  ? left breast  ? Chronic pain   ? Closed supracondylar fracture of right femur ([place]) 09/18/2012  ? Depression   ? Family history of breast cancer   ? GERD (gastroesophageal reflux disease)   ? pt denies  ? Headache(784.0)   ? Hypertension   ? Hypothyroidism   ? Opioid dependence in controlled environment [facility])   ? Osteoporosis   ? Osteoporosis with fracture 09/19/2012  ? Pneumonia   ? Thyroid disease   ? ? ?Medications:  ?Medications Prior to Admission  ?Medication Sig Dispense Refill Last Dose  ? acetaminophen (TYLENOL) 325 MG tablet Take 2 tablets (650 mg total) by mouth every 6  (six) hours as needed for mild pain or moderate pain.   Past Week  ? amiodarone (PACERONE) 200 MG tablet Take 1 tablet (200 mg total) by mouth daily.   11/25/2021  ? anastrozole (ARIMIDEX) 1 MG tablet TAKE 1 TABLET BY MOUTH  DAILY (Patient taking differently: Take 1 mg by mouth daily.) 90 tablet 3 11/25/2021  ? aspirin EC 81 MG EC tablet Take 1 tablet (81 mg total) by mouth daily. Swallow whole. 30 tablet 11 11/25/2021  ? B Complex Vitamins (B COMPLEX 100 PO) Take 1 tablet by mouth daily.   11/25/2021  ? Biotin 10000 MCG TABS Take 10,000 mcg by mouth daily.   11/25/2021  ? buPROPion (WELLBUTRIN XL) 300 MG 24 hr tablet Take 300 mg by mouth daily.   11/25/2021  ? Calcium Carbonate (CALCIUM 600 PO) Take 600 mg by mouth in the morning and at bedtime.   11/25/2021  ? carbamazepine (TEGRETOL XR) 200 MG 12 hr tablet Take 400 mg by mouth 2 (two) times daily.   11/25/2021  ? cholecalciferol (VITAMIN D) 400 units TABS tablet Take 400 Units by mouth daily.   11/25/2021  ? clopidogrel (PLAVIX)  75 MG tablet Take 1 tablet (75 mg total) by mouth daily. Do not stop unless directed by cardiology or cardiothoracic surgery.   11/26/2021  ? cyclobenzaprine (FLEXERIL) 10 MG tablet Take 1 tablet (10 mg total) by mouth 3 (three) times daily as needed for muscle spasms. 21 tablet 0 11/26/2021  ? DULoxetine (CYMBALTA) 30 MG capsule Take 30 mg by mouth 2 (two) times daily.   11/25/2021  ? ferrous sulfate 325 (65 FE) MG EC tablet Take 325 mg by mouth daily with breakfast.   11/25/2021  ? furosemide (LASIX) 40 MG tablet Take 1 tablet (40 mg total) by mouth daily.   11/26/2021  ? gabapentin (NEURONTIN) 100 MG capsule Take 2 capsules (200 mg total) by mouth 3 (three) times daily.   11/25/2021  ? latanoprost (XALATAN) 0.005 % ophthalmic solution Place 1 drop into both eyes at bedtime.   11/25/2021  ? levothyroxine (SYNTHROID) 88 MCG tablet Take 88 mcg by mouth daily.   11/26/2021  ? liothyronine (CYTOMEL) 25 MCG tablet Take 25 mcg by mouth daily.   11/26/2021  ? metoprolol  succinate (TOPROL-XL) 25 MG 24 hr tablet Take 1 tablet (25 mg total) by mouth daily.   11/26/2021 at 0800  ? Multiple Vitamins-Iron (MULTIVITAMIN/IRON PO) Take 1 tablet by mouth daily.    11/25/2021  ? mupirocin ointment (BACTROBAN) 2 % Apply 1 application topically 3 (three) times daily as needed for rash or irritation.   Past Week  ? ondansetron (ZOFRAN) 4 MG tablet Take 1 tablet (4 mg total) by mouth every 8 (eight) hours as needed for nausea or vomiting. 10 tablet 0 Past Month  ? oxyCODONE-acetaminophen (PERCOCET) 10-325 MG tablet Take 1 tablet by mouth 4 (four) times daily.   11/26/2021  ? pantoprazole (PROTONIX) 40 MG tablet Take 1 tablet (40 mg total) by mouth daily.   11/25/2021  ? polyethylene glycol (MIRALAX / GLYCOLAX) 17 g packet Take 17 g by mouth 2 (two) times daily. (Patient taking differently: Take 17 g by mouth daily.) 14 each 0 11/26/2021  ? potassium chloride SA (KLOR-CON M) 20 MEQ tablet Take 1 tablet (20 mEq total) by mouth daily.   11/25/2021  ? QUEtiapine (SEROQUEL) 100 MG tablet Take 2 tablets (200 mg total) by mouth at bedtime.   11/25/2021  ? rosuvastatin (CRESTOR) 10 MG tablet Take 1 tablet (10 mg total) by mouth daily.   11/26/2021  ? simethicone (MYLICON) 80 MG chewable tablet Chew 80 mg by mouth every 6 (six) hours as needed for flatulence.   11/25/2021  ? TRINTELLIX 20 MG TABS tablet Take 20 mg by mouth daily.   11/25/2021  ? ? ?Scheduled:  ?Infusions:  ?PRN:  ? 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 Patient is presenting with SOB. Heparin per pharmacy consult placed for pulmonary embolus.  EDP consult CT surg who is ok with heparin from their perspective. ? ?CTA PE w/ small segmental acute PE in rt upper and lower lobe. Patient is not on anticoagulation prior to arrival. ? ?Hgb 9.7 - BL; plt 373 ? ?3/10 AM update: ?Heparin level just below goal ? ?Goal of Therapy:   ?Heparin level 0.3-0.7 units/ml ?Monitor platelets by anticoagulation protocol: Yes ?  ?Plan:  ?Increase heparin infusion to 1450 units/hr ?Check anti-Xa level in 6-8 hours and daily while on heparin ?Continue to monitor H&H and platelets ? ?Narda Bonds, PharmD, BCPS ?Clinical  Pharmacist ?Phone: [phone] ? ?

## 2021-11-27 NOTE — Progress Notes (Addendum)
PROGRESS NOTE    Deborah Cooke  PIR:[id number] DOB: 05-29-1951 DOA: 11/26/2021 PCP: Rich Fuchs, PA    Brief Narrative:   Deborah Cooke is a 71 y.o. female with past medical history of myocardial infarction status post CABG in October 27, 2021 who was discharged to rehab and subsequently went home 4 days back presented to hospital with decreased ambulation, increasing shortness of breath and generalized weakness with lower extremity edema.  Patient's son also noted that that she was having some jerks of her extremities.  Patient was then brought into the hospital.  In the ED CT angiogram of the chest showed small segmental right upper lobe and lower lobe pulmonary embolism.  Dopplers were negative for DVT.  Cardiology was consulted.  Patient admitted for further management of acute pulm embolism and evaluation of generalized weakness and shortness of breath..     Assessment and Plan: * Acute pulmonary embolism (Deborah Cooke) Likely secondary to sedentary status at home.  Dopplers were negative for DVT.  On IV heparin at this time.  Myoclonic jerking Seen by neurology.  EEG unremarkable for seizures.  Patient is on Tegretol as outpatient.  CT head was unremarkable.  ABG unremarkable.  S/P CABG x 4 Cardiology on board.  Continue beta-blockers.  Hypertension Blood pressure was marginally low and required IV bolus overnight.  On long-acting metoprolol at this time.  Malignant neoplasm of upper-outer quadrant of left breast in female, estrogen receptor positive (Deborah Cooke) In remission.  On anastrozole.  Hypothyroidism Continue Synthroid.  S/P reverse total shoulder arthroplasty, right Continue supportive care.  Off sling recently.  Chronic pain On Flexeril, gabapentin.  Add as needed Percocet.  AKI (acute kidney injury) (HCC) Mild.  We will continue to monitor closely.  Nephrotoxic medications.  Generalized weakness Debility, deconditioning.  Patient had ambulatory dysfunction at home though  she was recently admitted and discharged from a rehabilitation.  Family unable to take care of her at home.  We will get PT OT evaluation.  Might need skilled nursing facility placement.  TOC has been consulted.    DVT prophylaxis:   Heparin drip   Code Status:     Code Status: Full Code  Disposition: Likely to skilled nursing facility  Status is: Inpatient  Remains inpatient appropriate because: Pulmonary embolism on heparin drip, extreme debility weakness likely needing rehabilitation,   Family Communication:  Spoke with the patient at bedside.  I also spoke with the patient's brother on the phone at the patient's request and updated him about the clinical condition of the patient. I spoke with the patient's spouse on the phone and updated him about the clinical condition as well.  Consultants:  Cardiology Neurology  Procedures:  None  Antimicrobials:  None  Anti-infectives (From admission, onward)    None        Subjective: Today, patient was seen and examined at bedside.  Patient complains of shortness of breath but denies chest pain fever or chills.  Has generalized weakness and was unable to ambulate at home.    Objective: Vitals:   11/27/21 0330 11/27/21 0359 11/27/21 0832 11/27/21 1152  BP: 97/66 (!) 87/65 95/63 115/88  Pulse:  79 91 86  Resp:   20 20  Temp:  98.3 F (36.8 C) 98.4 F (36.9 C) 98.6 F (37 C)  TempSrc:  Oral Oral Oral  SpO2:  99% 99% 100%  Weight:  111.1 kg    Height:        Intake/Output Summary (Last  24 hours) at 11/27/2021 1323 Last data filed at 11/27/2021 0600 Gross per 24 hour  Intake 254.73 ml  Output 400 ml  Net -145.27 ml   Filed Weights   11/26/21 1434 11/26/21 1713 11/27/21 0359  Weight: 111.1 kg 111.8 kg 111.1 kg   Body mass index is 49.47 kg/m.  Physical Examination:  General: Obese built,, not in obvious distress, nasal cannula oxygen HENT:   No scleral pallor or icterus noted. Oral mucosa is moist.  Chest:     Diminished breath sounds bilaterally. No crackles or wheezes.  CVS: S1 &S2 heard. No murmur.  Regular rate and rhythm.  Recent CABG scar healed. Abdomen: Soft, nontender, nondistended.  Bowel sounds are heard.   Extremities: No cyanosis, clubbing with lower extremity edema.  Peripheral pulses are palpable.  Right shoulder scar. Psych: Alert, awake and oriented, normal mood CNS:  No cranial nerve deficits.  Generalized weakness of the extremities. Skin: Warm and dry.  No rashes noted.  Data Reviewed:   CBC: Recent Labs  Lab 11/26/21 1119 11/27/21 0204  WBC 7.3 6.4  NEUTROABS 5.2  --   HGB 9.7* 8.3*  HCT 30.8* 25.8*  MCV 99.0 97.4  PLT 373 182    Basic Metabolic Panel: Recent Labs  Lab 11/26/21 1119 11/27/21 0204  NA 136 137  K 3.8 3.9  CL 96* 101  CO2 27 28  GLUCOSE 92 94  BUN 27* 24*  CREATININE 1.17* 1.14*  CALCIUM 8.8* 8.1*  MG 2.1  --     Liver Function Tests: Recent Labs  Lab 11/26/21 1815 11/27/21 0204  AST 17 14*  ALT 14 11  ALKPHOS 139* 109  BILITOT 0.5 0.4  PROT 6.1* 5.0*  ALBUMIN 2.8* 2.4*     Radiology Studies: CT HEAD WO CONTRAST (5MM)  Result Date: 11/26/2021 CLINICAL DATA:  Altered mental status. EXAM: CT HEAD WITHOUT CONTRAST TECHNIQUE: Contiguous axial images were obtained from the base of the skull through the vertex without intravenous contrast. RADIATION DOSE REDUCTION: This exam was performed according to the departmental dose-optimization program which includes automated exposure control, adjustment of the mA and/or kV according to patient size and/or use of iterative reconstruction technique. COMPARISON:  March 13, 2020. FINDINGS: Brain: Mild diffuse cortical atrophy is noted. No mass effect or midline shift is noted. Ventricular size is within normal limits. There is no evidence of mass lesion, hemorrhage or acute infarction. Vascular: No hyperdense vessel or unexpected calcification. Skull: Normal. Negative for fracture or focal lesion.  Sinuses/Orbits: No acute finding. Other: None. IMPRESSION: No acute intracranial abnormality seen. Electronically Signed   By: Marijo Conception M.D.   On: 11/26/2021 16:01   CT Angio Chest PE W/Cm &/Or Wo Cm  Result Date: 11/26/2021 CLINICAL DATA:  Worsening shortness of breath today. CABG on 08/26/2022. Right shoulder total reverse arthroplasty 10/20/2021. Bilateral lower extremity edema. EXAM: CT ANGIOGRAPHY CHEST WITH CONTRAST TECHNIQUE: Multidetector CT imaging of the chest was performed using the standard protocol during bolus administration of intravenous contrast. Multiplanar CT image reconstructions and MIPs were obtained to evaluate the vascular anatomy. RADIATION DOSE REDUCTION: This exam was performed according to the departmental dose-optimization program which includes automated exposure control, adjustment of the mA and/or kV according to patient size and/or use of iterative reconstruction technique. CONTRAST:  46m OMNIPAQUE IOHEXOL 350 MG/ML SOLN COMPARISON:  Multiple exams, including chest radiograph 11/26/2021 FINDINGS: Body habitus reduces diagnostic sensitivity and specificity. Metal artifact from spinal and shoulder hardware also reduces signal to noise  ratio, metal artifact reduction processing was employed. Cardiovascular: Prominent main pulmonary artery at 4.5 cm in transverse diameter on image 139 series 8, compatible with pulmonary arterial hypertension. Right lower lobe posterior basal segmental acute pulmonary embolus observed on image 263, series 8. Suspected small segmental right upper lobe pulmonary embolus, image 151 series 8 and also separately on image 129 series 8. No lobar or larger pulmonary embolus identified. Coronary, aortic arch, and branch vessel atherosclerotic vascular disease. Prior CABG with continued edema along the anterior mediastinal soft tissues and around the sternum with a small pericardial effusion. Moderate cardiomegaly. Substantial mitral valve calcification  and mild aortic valve calcifications. Mediastinum/Nodes: As noted above, there is continued edema in the anterior mediastinum around the sternum likely related to the patient's prior CABG. Edema tracks around the sternotomy site. 0.8 cm AP window lymph node on image 43 series 6, probably reactive. Lungs/Pleura: Mild mosaic attenuation in the lungs. There is likely some trace mucus in the trachea dependently. Left anterior subpleural reticulation, suggesting prior radiation therapy for left breast cancer. Trace left pleural effusion. Upper Abdomen: Previous gastric bypass. Musculoskeletal: Right total shoulder arthroplasty. Lower cervical fusion with fixation hardware. Healing sternotomy. Subacute fractures of the right anterior second, third, fourth, fifth, sixth, and seventh ribs, with some early healing response. Fractures of the left anterior third, fourth, fifth, sixth, and seventh ribs are more age indeterminate and could be chronic. Likely chronic superior endplate compression at L1. Review of the MIP images confirms the above findings. IMPRESSION: 1. Small segmental acute pulmonary emboli in the right upper lobe and right lower lobe pulmonary artery. 2. Prominent main pulmonary artery at 4.5 cm in transverse diameter indicating pulmonary arterial hypertension. Underlying cardiomegaly and coronary atherosclerosis noted. 3. Recent CABG with healing median sternotomy and continued edema adjacent to the sternotomy site and anterior mediastinum. Small pericardial effusion. 4. Mild mosaic attenuation in the lungs, potentially reflecting a component of mild edema or bronchiolitis obliterans. 5. Subacute fractures of the right anterior second through seventh ribs. Likely older fractures of the left anterior third through seventh ribs. 6. Other imaging findings of potential clinical significance: Trace left pleural effusion. Radiation therapy related findings anteriorly in the left lung. Prior gastric bypass. Mitral  valve and aortic valve calcification. Critical Value/emergent results were called by telephone at the time of interpretation on 11/26/2021 at 2:05 pm to provider [facility] , who verbally acknowledged these results. Electronically Signed   By: Van Clines M.D.   On: 11/26/2021 14:12   DG Chest Port 1 View  Result Date: 11/26/2021 CLINICAL DATA:  Provided history: Shortness of breath. EXAM: PORTABLE CHEST 1 VIEW COMPARISON:  Prior chest radiographs 11/06/2021 and earlier. FINDINGS: Prior median sternotomy/CABG. Unchanged cardiomegaly. No appreciable airspace consolidation or pulmonary edema. No evidence of pleural effusion or pneumothorax. No acute bony abnormality identified. Prior right shoulder arthroplasty. Partially imaged cervical spinal fusion hardware. Surgical clips within the upper abdomen. IMPRESSION: No evidence of acute cardiopulmonary abnormality. Cardiomegaly. Electronically Signed   By: Kellie Simmering D.O.   On: 11/26/2021 11:38   EEG adult  Result Date: 11/26/2021 Gwinda Maine, MD     11/26/2021 10:16 PM EEG Inpatient Electroencephalogram Report Clinical Neurophysiology Services Patient Name: Yevette Knust Age: 71 y.o. Sex: female MRN: [id number] Date: 11/26/2021 Location: 6E19 Neurologist:  Lynnae Sandhoff, MD Clinical Information ANIKAH HOGGE is a 71 y.o. female shoulder surgery beginning of last month followed by having a heart attack and getting four-vessel bypass.  She went to  rehab and was just discharged from rehab 3 days ago.  She has been doing poorly at home.  Generally weak all over unsteady on her feet.  Has had multiple falls.  Uses walker.  Increased shortness of breath.  No cough abdominal pain vomiting diarrhea.  No urinary symptoms.  States hands are trembling and she cannot hold a cup of coffee or feed herself.  Son does not feel like she can manage at home.  Her husband is off for weeks at a time driving a truck. Medications Prior to Admission medications  Medication Sig  Start Date End Date Taking? Authorizing Provider acetaminophen (TYLENOL) 325 MG tablet Take 2 tablets (650 mg total) by mouth every 6 (six) hours as needed for mild pain or moderate pain. 11/11/21  Yes Roddenberry, Arlis Porta, PA-C amiodarone (PACERONE) 200 MG tablet Take 1 tablet (200 mg total) by mouth daily. 11/11/21  Yes Roddenberry, Myron G, PA-C anastrozole (ARIMIDEX) 1 MG tablet TAKE 1 TABLET BY MOUTH  DAILY Patient taking differently: Take 1 mg by mouth daily. 08/21/21  Yes Truitt Merle, MD aspirin EC 81 MG EC tablet Take 1 tablet (81 mg total) by mouth daily. Swallow whole. 11/11/21  Yes Roddenberry, Myron G, PA-C B Complex Vitamins (B COMPLEX 100 PO) Take 1 tablet by mouth daily.   Yes [provider] Biotin 10000 MCG TABS Take 10,000 mcg by mouth daily.   Yes [provider] buPROPion (WELLBUTRIN XL) 300 MG 24 hr tablet Take 300 mg by mouth daily. 06/09/21  Yes [provider] Calcium Carbonate (CALCIUM 600 PO) Take 600 mg by mouth in the morning and at bedtime.   Yes [provider] carbamazepine (TEGRETOL XR) 200 MG 12 hr tablet Take 400 mg by mouth 2 (two) times daily. 07/26/18 12/26/21 Yes [provider] cholecalciferol (VITAMIN D) 400 units TABS tablet Take 400 Units by mouth daily.   Yes [provider] clopidogrel (PLAVIX) 75 MG tablet Take 1 tablet (75 mg total) by mouth daily. Do not stop unless directed by cardiology or cardiothoracic surgery. 11/11/21  Yes Roddenberry, Arlis Porta, PA-C cyclobenzaprine (FLEXERIL) 10 MG tablet Take 1 tablet (10 mg total) by mouth 3 (three) times daily as needed for muscle spasms. 10/17/20  Yes McClung, Sarah A, PA-C DULoxetine (CYMBALTA) 30 MG capsule Take 30 mg by mouth 2 (two) times daily.   Yes [provider] ferrous sulfate 325 (65 FE) MG EC tablet Take 325 mg by mouth daily with breakfast.   Yes [provider] furosemide (LASIX) 40 MG tablet Take 1 tablet (40 mg total) by mouth daily. 11/11/21  Yes  Roddenberry, Arlis Porta, PA-C gabapentin (NEURONTIN) 100 MG capsule Take 2 capsules (200 mg total) by mouth 3 (three) times daily. 11/11/21  Yes Roddenberry, Arlis Porta, PA-C latanoprost (XALATAN) 0.005 % ophthalmic solution Place 1 drop into both eyes at bedtime. 06/25/16  Yes [provider] levothyroxine (SYNTHROID) 88 MCG tablet Take 88 mcg by mouth daily.   Yes [provider] liothyronine (CYTOMEL) 25 MCG tablet Take 25 mcg by mouth daily. 10/28/21  Yes [provider] metoprolol succinate (TOPROL-XL) 25 MG 24 hr tablet Take 1 tablet (25 mg total) by mouth daily. 11/11/21  Yes Roddenberry, Arlis Porta, PA-C Multiple Vitamins-Iron (MULTIVITAMIN/IRON PO) Take 1 tablet by mouth daily.    Yes [provider] mupirocin ointment (BACTROBAN) 2 % Apply 1 application topically 3 (three) times daily as needed for rash or irritation. 10/10/21  Yes [provider] ondansetron [facility])  4 MG tablet Take 1 tablet (4 mg total) by mouth every 8 (eight) hours as needed for nausea or vomiting. 10/20/21  Yes Merlene Pulling K, PA-C oxyCODONE-acetaminophen (PERCOCET) 10-325 MG tablet Take 1 tablet by mouth 4 (four) times daily.   Yes [provider] pantoprazole (PROTONIX) 40 MG tablet Take 1 tablet (40 mg total) by mouth daily. 11/12/21  Yes Roddenberry, Arlis Porta, PA-C polyethylene glycol (MIRALAX / GLYCOLAX) 17 g packet Take 17 g by mouth 2 (two) times daily. Patient taking differently: Take 17 g by mouth daily. 10/21/20  Yes Patrecia Pour, MD potassium chloride SA (KLOR-CON M) 20 MEQ tablet Take 1 tablet (20 mEq total) by mouth daily. 11/11/21  Yes Roddenberry, Myron G, PA-C QUEtiapine (SEROQUEL) 100 MG tablet Take 2 tablets (200 mg total) by mouth at bedtime. 11/11/21  Yes Roddenberry, Arlis Porta, PA-C rosuvastatin (CRESTOR) 10 MG tablet Take 1 tablet (10 mg total) by mouth daily. 11/11/21  Yes Roddenberry, Arlis Porta, PA-C simethicone (MYLICON) 80 MG chewable tablet Chew 80 mg by mouth every 6 (six)  hours as needed for flatulence.   Yes [provider] TRINTELLIX 20 MG TABS tablet Take 20 mg by mouth daily. 11/24/21  Yes [provider] Recording Conditions This is a routine less than one hour EEG recording.  The EEG was performed utilizing standard international 10-20 system of electrode placement, with additional channels monitored for eye movement.  One channel electrocardiogram was monitored.  Data were obtained, stored, and interpreted according to ACNS guidelines (J Clin Neurophysiol 2006;23(2):85-183) utilizing referential montage recording, with reformatting to longitudinal, transverse bipolar, and referential montages as necessary for interpretation, along with digital/automated EEG analysis.  Patient tolerated entire procedure well.  Photic stimulation and hyperventilation were utilized as activation procedures unless otherwise specified below. EEG Description Background Activity: The posterior dominant rhythm (PDR) was 8-'9Hz'$  and reactive. The predominant background consisted of normal voltage and normal anterior to posterior progression. Paroxysmal Abnormalities: None Drowsiness/Sleep: Normal fragmentation of background with occasional vertex waves appreciated during the recording. Stimulation: Hyperventilation and photic stimulation were not performed. ECG Recording: abundant artifact. Recording Quality: Minor muscle, motion, and eye movement artifacts were occasionally noted. EEG Interpretation Ictal Events: None 2.   Interictal Abnormalities: none 3.   Non-Epileptic Events: none Clinical Correlation This is a normal EEG. No interictal epileptiform abnormalities were recorded.  The absence of epileptiform abnormalities does not preclude a clinical diagnosis of seizures.   Electronically signed by: Lynnae Sandhoff, MD Page: [phone] 11/26/2021, 9:50 PM   VAS [place] LOWER EXTREMITY VENOUS (DVT)  Result Date: 11/26/2021  Lower Venous DVT Study Patient Name:  OVIE EASTEP  Date of Exam:    11/26/2021 Medical Rec #: [id number]     Accession #:    [id number] Date of Birth: 1951-04-07     Patient Gender: F Patient Age:   4 years Exam Location:  [facility] Procedure:      VAS [place] LOWER EXTREMITY VENOUS (DVT) Referring Phys: Gean Birchwood --------------------------------------------------------------------------------  Indications: Pulmonary embolism.  Limitations: Body habitus and poor ultrasound/tissue interface. Comparison Study: no prior Performing Technologist: Archie Patten RVS  Examination Guidelines: A complete evaluation includes B-mode imaging, spectral Doppler, color Doppler, and power Doppler as needed of all accessible portions of each vessel. Bilateral testing is considered an integral part of a complete examination. Limited examinations for reoccurring indications may be performed as noted. The reflux portion of the exam is performed with the patient in reverse Trendelenburg.  +---------+---------------+---------+-----------+----------+-------------------+  RIGHT  Compressibility Phasicity Spontaneity Properties Thrombus Aging       +---------+---------------+---------+-----------+----------+-------------------+  CFV       Full            Yes       Yes                                         +---------+---------------+---------+-----------+----------+-------------------+  SFJ       Full                                                                  +---------+---------------+---------+-----------+----------+-------------------+  FV Prox   Full                                                                  +---------+---------------+---------+-----------+----------+-------------------+  FV Mid    Full                                                                  +---------+---------------+---------+-----------+----------+-------------------+  FV Distal Full            Yes       Yes                                          +---------+---------------+---------+-----------+----------+-------------------+  PFV       Full                                                                  +---------+---------------+---------+-----------+----------+-------------------+  POP       Full            Yes       Yes                                         +---------+---------------+---------+-----------+----------+-------------------+  PTV       Full                                                                  +---------+---------------+---------+-----------+----------+-------------------+  PERO  Not well visualized  +---------+---------------+---------+-----------+----------+-------------------+   +--------+---------------+---------+-----------+----------+--------------------+  LEFT     Compressibility Phasicity Spontaneity Properties Thrombus Aging        +--------+---------------+---------+-----------+----------+--------------------+  CFV      Full            Yes       Yes                                          +--------+---------------+---------+-----------+----------+--------------------+  SFJ      Full                                                                   +--------+---------------+---------+-----------+----------+--------------------+  FV Prox  Full                                                                   +--------+---------------+---------+-----------+----------+--------------------+  FV Mid   Full                                                                   +--------+---------------+---------+-----------+----------+--------------------+  FV       Full            Yes       Yes                                           Distal                                                                          +--------+---------------+---------+-----------+----------+--------------------+  PFV      Full                                                                    +--------+---------------+---------+-----------+----------+--------------------+  POP      Full                                                                   +--------+---------------+---------+-----------+----------+--------------------+  PTV      Full                                             patent by color                                                                  doppler               +--------+---------------+---------+-----------+----------+--------------------+  PERO                                                      Not well visualized   +--------+---------------+---------+-----------+----------+--------------------+     Summary: RIGHT: - There is no evidence of deep vein thrombosis in the lower extremity. However, portions of this examination were limited- see technologist comments above.  - No cystic structure found in the popliteal fossa.  LEFT: - There is no evidence of deep vein thrombosis in the lower extremity. However, portions of this examination were limited- see technologist comments above.  - No cystic structure found in the popliteal fossa.  *See table(s) above for measurements and observations. Electronically signed by Jamelle Haring on 11/26/2021 at 8:57:21 PM.    Final       LOS: 0 days    Flora Lipps, MD [facility] 11/27/2021, 1:23 PM

## 2021-11-27 NOTE — Assessment & Plan Note (Addendum)
On Flexeril, gabapentin, PRN Percocet. ?

## 2021-11-27 NOTE — Progress Notes (Signed)
HOSPITAL MEDICINE OVERNIGHT EVENT NOTE   ? ?Notified by nursing that patient's blood pressures have been downtrending, as low as 76/42. ? ?Chart reviewed, patient is currently being treated for small segmental acute pulmonary emboli in the right upper lobe and right lower lobe with no evidence of right heart strain. ? ?We will administer a 250 cc bolus of lactated Ringer solution and reassess blood pressure. ? ?Vernelle Emerald  MD ?[facility]  ? ?ADDENDUM (3/10 5:05AM) ? ?Initial serial blood pressure after bolus is complete is 97/66.  Patient continues to exhibit no clinical change in symptoms. ? ?Continue to monitor closely. ? ?Sherryll Burger Kary Sugrue ? ? ? ? ? ? ? ? ? ? ? ? ?

## 2021-11-27 NOTE — Assessment & Plan Note (Addendum)
Improved at this time.  Seen by neurology.  EEG unremarkable for seizures.  Patient is on Tegretol as outpatient.  CT head was unremarkable.  ABG unremarkable.

## 2021-11-27 NOTE — Progress Notes (Signed)
Progress Note  Patient Name: Deborah Cooke Date of Encounter: 11/27/2021  [facility] Cardiologist: Larae Grooms, MD   Subjective   Had hypotension overnight, given small fluid bolus. BP remains low this AM. Hgb also decreased from yesterday. No gross bleeding that she has seen, no blood in urine (purewick), has not had BM yet. She does have a history of hemorrhoids. She was asymptomatic with her blood pressures.  She notes that she has had bilateral LE edema for a long time, wasn't taking diuretic as frequently as prescribed as it made her urinate a lot.  Inpatient Medications    Scheduled Meds:  amiodarone  200 mg Oral Daily   anastrozole  1 mg Oral Daily   buPROPion  300 mg Oral Daily   carbamazepine  400 mg Oral BID   cholecalciferol  400 Units Oral Daily   clopidogrel  75 mg Oral Daily   DULoxetine  30 mg Oral BID   ferrous sulfate  325 mg Oral Q breakfast   gabapentin  200 mg Oral TID   latanoprost  1 drop Both Eyes QHS   levothyroxine  88 mcg Oral Daily   liothyronine  25 mcg Oral Daily   metoprolol succinate  25 mg Oral Daily   potassium chloride SA  20 mEq Oral Daily   QUEtiapine  200 mg Oral QHS   rosuvastatin  10 mg Oral Daily   vortioxetine HBr  20 mg Oral Daily   Continuous Infusions:  heparin 1,450 Units/hr (11/27/21 0422)   PRN Meds: cyclobenzaprine, simethicone   Vital Signs    Vitals:   11/26/21 2355 11/27/21 0330 11/27/21 0359 11/27/21 0832  BP: (!) 89/51 97/66 (!) 87/65 95/63  Pulse: 87  79 91  Resp: 20   20  Temp: 98.5 F (36.9 C)  98.3 F (36.8 C) 98.4 F (36.9 C)  TempSrc: Oral  Oral Oral  SpO2: 100%  99% 99%  Weight:   111.1 kg   Height:        Intake/Output Summary (Last 24 hours) at 11/27/2021 0920 Last data filed at 11/27/2021 0600 Gross per 24 hour  Intake 254.73 ml  Output 400 ml  Net -145.27 ml   Last 3 Weights 11/27/2021 11/26/2021 11/26/2021  Weight (lbs) 244 lb 14.9 oz 246 lb 7.6 oz 245 lb  Weight (kg) 111.1 kg 111.8  kg 111.131 kg      Telemetry    NSR - Personally Reviewed  ECG    11/26/21 SR, iRBBB - Personally Reviewed  Physical Exam   GEN: No acute distress.   Neck: No JVD appreciated but difficult body habitus Cardiac: RRR, no murmurs, rubs, or gallops.  Respiratory: Clear to auscultation bilaterally. GI: Soft, nontender, non-distended  MS: bilateral LE edema to thighs, 2+ pitting Neuro:  Nonfocal  Psych: Normal affect   Labs    High Sensitivity Troponin:   Recent Labs  Lab 11/26/21 1119 11/26/21 1310  TROPONINIHS 54* 50*     Chemistry Recent Labs  Lab 11/26/21 1119 11/26/21 1815 11/27/21 0204  NA 136  --  137  K 3.8  --  3.9  CL 96*  --  101  CO2 27  --  28  GLUCOSE 92  --  94  BUN 27*  --  24*  CREATININE 1.17*  --  1.14*  CALCIUM 8.8*  --  8.1*  MG 2.1  --   --   PROT  --  6.1* 5.0*  ALBUMIN  --  2.8* 2.4*  AST  --  17 14*  ALT  --  14 11  ALKPHOS  --  139* 109  BILITOT  --  0.5 0.4  GFRNONAA 49*  --  51*  ANIONGAP 13  --  8    Lipids No results for input(s): CHOL, TRIG, HDL, LABVLDL, LDLCALC, CHOLHDL in the last 168 hours.  Hematology Recent Labs  Lab 11/26/21 1119 11/27/21 0204  WBC 7.3 6.4  RBC 3.11* 2.65*  HGB 9.7* 8.3*  HCT 30.8* 25.8*  MCV 99.0 97.4  MCH 31.2 31.3  MCHC 31.5 32.2  RDW 16.2* 16.0*  PLT 373 304   Thyroid  Recent Labs  Lab 11/26/21 1815  TSH 5.546*    BNP Recent Labs  Lab 11/26/21 1119  BNP 482.4*    DDimer No results for input(s): DDIMER in the last 168 hours.   Radiology    CT HEAD WO CONTRAST (5MM)  Result Date: 11/26/2021 CLINICAL DATA:  Altered mental status. EXAM: CT HEAD WITHOUT CONTRAST TECHNIQUE: Contiguous axial images were obtained from the base of the skull through the vertex without intravenous contrast. RADIATION DOSE REDUCTION: This exam was performed according to the departmental dose-optimization program which includes automated exposure control, adjustment of the mA and/or kV according to patient  size and/or use of iterative reconstruction technique. COMPARISON:  March 13, 2020. FINDINGS: Brain: Mild diffuse cortical atrophy is noted. No mass effect or midline shift is noted. Ventricular size is within normal limits. There is no evidence of mass lesion, hemorrhage or acute infarction. Vascular: No hyperdense vessel or unexpected calcification. Skull: Normal. Negative for fracture or focal lesion. Sinuses/Orbits: No acute finding. Other: None. IMPRESSION: No acute intracranial abnormality seen. Electronically Signed   By: Marijo Conception M.D.   On: 11/26/2021 16:01   CT Angio Chest PE W/Cm &/Or Wo Cm  Result Date: 11/26/2021 CLINICAL DATA:  Worsening shortness of breath today. CABG on 08/26/2022. Right shoulder total reverse arthroplasty 10/20/2021. Bilateral lower extremity edema. EXAM: CT ANGIOGRAPHY CHEST WITH CONTRAST TECHNIQUE: Multidetector CT imaging of the chest was performed using the standard protocol during bolus administration of intravenous contrast. Multiplanar CT image reconstructions and MIPs were obtained to evaluate the vascular anatomy. RADIATION DOSE REDUCTION: This exam was performed according to the departmental dose-optimization program which includes automated exposure control, adjustment of the mA and/or kV according to patient size and/or use of iterative reconstruction technique. CONTRAST:  59m OMNIPAQUE IOHEXOL 350 MG/ML SOLN COMPARISON:  Multiple exams, including chest radiograph 11/26/2021 FINDINGS: Body habitus reduces diagnostic sensitivity and specificity. Metal artifact from spinal and shoulder hardware also reduces signal to noise ratio, metal artifact reduction processing was employed. Cardiovascular: Prominent main pulmonary artery at 4.5 cm in transverse diameter on image 139 series 8, compatible with pulmonary arterial hypertension. Right lower lobe posterior basal segmental acute pulmonary embolus observed on image 263, series 8. Suspected small segmental right upper  lobe pulmonary embolus, image 151 series 8 and also separately on image 129 series 8. No lobar or larger pulmonary embolus identified. Coronary, aortic arch, and branch vessel atherosclerotic vascular disease. Prior CABG with continued edema along the anterior mediastinal soft tissues and around the sternum with a small pericardial effusion. Moderate cardiomegaly. Substantial mitral valve calcification and mild aortic valve calcifications. Mediastinum/Nodes: As noted above, there is continued edema in the anterior mediastinum around the sternum likely related to the patient's prior CABG. Edema tracks around the sternotomy site. 0.8 cm AP window lymph node on image 43 series  6, probably reactive. Lungs/Pleura: Mild mosaic attenuation in the lungs. There is likely some trace mucus in the trachea dependently. Left anterior subpleural reticulation, suggesting prior radiation therapy for left breast cancer. Trace left pleural effusion. Upper Abdomen: Previous gastric bypass. Musculoskeletal: Right total shoulder arthroplasty. Lower cervical fusion with fixation hardware. Healing sternotomy. Subacute fractures of the right anterior second, third, fourth, fifth, sixth, and seventh ribs, with some early healing response. Fractures of the left anterior third, fourth, fifth, sixth, and seventh ribs are more age indeterminate and could be chronic. Likely chronic superior endplate compression at L1. Review of the MIP images confirms the above findings. IMPRESSION: 1. Small segmental acute pulmonary emboli in the right upper lobe and right lower lobe pulmonary artery. 2. Prominent main pulmonary artery at 4.5 cm in transverse diameter indicating pulmonary arterial hypertension. Underlying cardiomegaly and coronary atherosclerosis noted. 3. Recent CABG with healing median sternotomy and continued edema adjacent to the sternotomy site and anterior mediastinum. Small pericardial effusion. 4. Mild mosaic attenuation in the lungs,  potentially reflecting a component of mild edema or bronchiolitis obliterans. 5. Subacute fractures of the right anterior second through seventh ribs. Likely older fractures of the left anterior third through seventh ribs. 6. Other imaging findings of potential clinical significance: Trace left pleural effusion. Radiation therapy related findings anteriorly in the left lung. Prior gastric bypass. Mitral valve and aortic valve calcification. Critical Value/emergent results were called by telephone at the time of interpretation on 11/26/2021 at 2:05 pm to provider [facility] , who verbally acknowledged these results. Electronically Signed   By: Van Clines M.D.   On: 11/26/2021 14:12   DG Chest Port 1 View  Result Date: 11/26/2021 CLINICAL DATA:  Provided history: Shortness of breath. EXAM: PORTABLE CHEST 1 VIEW COMPARISON:  Prior chest radiographs 11/06/2021 and earlier. FINDINGS: Prior median sternotomy/CABG. Unchanged cardiomegaly. No appreciable airspace consolidation or pulmonary edema. No evidence of pleural effusion or pneumothorax. No acute bony abnormality identified. Prior right shoulder arthroplasty. Partially imaged cervical spinal fusion hardware. Surgical clips within the upper abdomen. IMPRESSION: No evidence of acute cardiopulmonary abnormality. Cardiomegaly. Electronically Signed   By: Kellie Simmering D.O.   On: 11/26/2021 11:38   EEG adult  Result Date: 11/26/2021 Gwinda Maine, MD     11/26/2021 10:16 PM EEG Inpatient Electroencephalogram Report Clinical Neurophysiology Services Patient Name: Erdine Hulen Age: 71 y.o. Sex: female MRN: [id number] Date: 11/26/2021 Location: 6E19 Neurologist:  Lynnae Sandhoff, MD Clinical Information CHARLESTON VIERLING is a 71 y.o. female shoulder surgery beginning of last month followed by having a heart attack and getting four-vessel bypass.  She went to rehab and was just discharged from rehab 3 days ago.  She has been doing poorly at home.  Generally weak all  over unsteady on her feet.  Has had multiple falls.  Uses walker.  Increased shortness of breath.  No cough abdominal pain vomiting diarrhea.  No urinary symptoms.  States hands are trembling and she cannot hold a cup of coffee or feed herself.  Son does not feel like she can manage at home.  Her husband is off for weeks at a time driving a truck. Medications Prior to Admission medications  Medication Sig Start Date End Date Taking? Authorizing Provider acetaminophen (TYLENOL) 325 MG tablet Take 2 tablets (650 mg total) by mouth every 6 (six) hours as needed for mild pain or moderate pain. 11/11/21  Yes Roddenberry, Arlis Porta, PA-C amiodarone (PACERONE) 200 MG tablet Take 1 tablet (200 mg  total) by mouth daily. 11/11/21  Yes Roddenberry, Myron G, PA-C anastrozole (ARIMIDEX) 1 MG tablet TAKE 1 TABLET BY MOUTH  DAILY Patient taking differently: Take 1 mg by mouth daily. 08/21/21  Yes Truitt Merle, MD aspirin EC 81 MG EC tablet Take 1 tablet (81 mg total) by mouth daily. Swallow whole. 11/11/21  Yes Roddenberry, Myron G, PA-C B Complex Vitamins (B COMPLEX 100 PO) Take 1 tablet by mouth daily.   Yes [provider] Biotin 10000 MCG TABS Take 10,000 mcg by mouth daily.   Yes [provider] buPROPion (WELLBUTRIN XL) 300 MG 24 hr tablet Take 300 mg by mouth daily. 06/09/21  Yes [provider] Calcium Carbonate (CALCIUM 600 PO) Take 600 mg by mouth in the morning and at bedtime.   Yes [provider] carbamazepine (TEGRETOL XR) 200 MG 12 hr tablet Take 400 mg by mouth 2 (two) times daily. 07/26/18 12/26/21 Yes [provider] cholecalciferol (VITAMIN D) 400 units TABS tablet Take 400 Units by mouth daily.   Yes [provider] clopidogrel (PLAVIX) 75 MG tablet Take 1 tablet (75 mg total) by mouth daily. Do not stop unless directed by cardiology or cardiothoracic surgery. 11/11/21  Yes Roddenberry, Arlis Porta, PA-C cyclobenzaprine (FLEXERIL) 10 MG tablet Take 1 tablet (10 mg total) by  mouth 3 (three) times daily as needed for muscle spasms. 10/17/20  Yes McClung, Sarah A, PA-C DULoxetine (CYMBALTA) 30 MG capsule Take 30 mg by mouth 2 (two) times daily.   Yes [provider] ferrous sulfate 325 (65 FE) MG EC tablet Take 325 mg by mouth daily with breakfast.   Yes [provider] furosemide (LASIX) 40 MG tablet Take 1 tablet (40 mg total) by mouth daily. 11/11/21  Yes Roddenberry, Arlis Porta, PA-C gabapentin (NEURONTIN) 100 MG capsule Take 2 capsules (200 mg total) by mouth 3 (three) times daily. 11/11/21  Yes Roddenberry, Arlis Porta, PA-C latanoprost (XALATAN) 0.005 % ophthalmic solution Place 1 drop into both eyes at bedtime. 06/25/16  Yes [provider] levothyroxine (SYNTHROID) 88 MCG tablet Take 88 mcg by mouth daily.   Yes [provider] liothyronine (CYTOMEL) 25 MCG tablet Take 25 mcg by mouth daily. 10/28/21  Yes [provider] metoprolol succinate (TOPROL-XL) 25 MG 24 hr tablet Take 1 tablet (25 mg total) by mouth daily. 11/11/21  Yes Roddenberry, Arlis Porta, PA-C Multiple Vitamins-Iron (MULTIVITAMIN/IRON PO) Take 1 tablet by mouth daily.    Yes [provider] mupirocin ointment (BACTROBAN) 2 % Apply 1 application topically 3 (three) times daily as needed for rash or irritation. 10/10/21  Yes [provider] ondansetron (ZOFRAN) 4 MG tablet Take 1 tablet (4 mg total) by mouth every 8 (eight) hours as needed for nausea or vomiting. 10/20/21  Yes Merlene Pulling K, PA-C oxyCODONE-acetaminophen (PERCOCET) 10-325 MG tablet Take 1 tablet by mouth 4 (four) times daily.   Yes [provider] pantoprazole (PROTONIX) 40 MG tablet Take 1 tablet (40 mg total) by mouth daily. 11/12/21  Yes Roddenberry, Arlis Porta, PA-C polyethylene glycol (MIRALAX / GLYCOLAX) 17 g packet Take 17 g by mouth 2 (two) times daily. Patient taking differently: Take 17 g by mouth daily. 10/21/20  Yes Patrecia Pour, MD potassium chloride SA (KLOR-CON M) 20 MEQ tablet Take 1  tablet (20 mEq total) by mouth daily. 11/11/21  Yes Roddenberry, Myron G, PA-C QUEtiapine (SEROQUEL) 100 MG tablet Take 2 tablets (200 mg total) by mouth at bedtime. 11/11/21  Yes Antony Odea, PA-C  rosuvastatin (CRESTOR) 10 MG tablet Take 1 tablet (10 mg total) by mouth daily. 11/11/21  Yes Roddenberry, Arlis Porta, PA-C simethicone (MYLICON) 80 MG chewable tablet Chew 80 mg by mouth every 6 (six) hours as needed for flatulence.   Yes [provider] TRINTELLIX 20 MG TABS tablet Take 20 mg by mouth daily. 11/24/21  Yes [provider] Recording Conditions This is a routine less than one hour EEG recording.  The EEG was performed utilizing standard international 10-20 system of electrode placement, with additional channels monitored for eye movement.  One channel electrocardiogram was monitored.  Data were obtained, stored, and interpreted according to ACNS guidelines (J Clin Neurophysiol 2006;23(2):85-183) utilizing referential montage recording, with reformatting to longitudinal, transverse bipolar, and referential montages as necessary for interpretation, along with digital/automated EEG analysis.  Patient tolerated entire procedure well.  Photic stimulation and hyperventilation were utilized as activation procedures unless otherwise specified below. EEG Description Background Activity: The posterior dominant rhythm (PDR) was 8-'9Hz'$  and reactive. The predominant background consisted of normal voltage and normal anterior to posterior progression. Paroxysmal Abnormalities: None Drowsiness/Sleep: Normal fragmentation of background with occasional vertex waves appreciated during the recording. Stimulation: Hyperventilation and photic stimulation were not performed. ECG Recording: abundant artifact. Recording Quality: Minor muscle, motion, and eye movement artifacts were occasionally noted. EEG Interpretation Ictal Events: None 2.   Interictal Abnormalities: none 3.   Non-Epileptic Events: none  Clinical Correlation This is a normal EEG. No interictal epileptiform abnormalities were recorded.  The absence of epileptiform abnormalities does not preclude a clinical diagnosis of seizures.   Electronically signed by: Lynnae Sandhoff, MD Page: [phone] 11/26/2021, 9:50 PM   VAS [place] LOWER EXTREMITY VENOUS (DVT)  Result Date: 11/26/2021  Lower Venous DVT Study Patient Name:  DHAMAR GREGORY  Date of Exam:   11/26/2021 Medical Rec #: [id number]     Accession #:    [id number] Date of Birth: 10-Mar-1951     Patient Gender: F Patient Age:   71 years Exam Location:  [facility] Procedure:      VAS [place] LOWER EXTREMITY VENOUS (DVT) Referring Phys: Gean Birchwood --------------------------------------------------------------------------------  Indications: Pulmonary embolism.  Limitations: Body habitus and poor ultrasound/tissue interface. Comparison Study: no prior Performing Technologist: Archie Patten RVS  Examination Guidelines: A complete evaluation includes B-mode imaging, spectral Doppler, color Doppler, and power Doppler as needed of all accessible portions of each vessel. Bilateral testing is considered an integral part of a complete examination. Limited examinations for reoccurring indications may be performed as noted. The reflux portion of the exam is performed with the patient in reverse Trendelenburg.  +---------+---------------+---------+-----------+----------+-------------------+  RIGHT     Compressibility Phasicity Spontaneity Properties Thrombus Aging       +---------+---------------+---------+-----------+----------+-------------------+  CFV       Full            Yes       Yes                                         +---------+---------------+---------+-----------+----------+-------------------+  SFJ       Full                                                                  +---------+---------------+---------+-----------+----------+-------------------+  FV Prox   Full                                                                   +---------+---------------+---------+-----------+----------+-------------------+  FV Mid    Full                                                                  +---------+---------------+---------+-----------+----------+-------------------+  FV Distal Full            Yes       Yes                                         +---------+---------------+---------+-----------+----------+-------------------+  PFV       Full                                                                  +---------+---------------+---------+-----------+----------+-------------------+  POP       Full            Yes       Yes                                         +---------+---------------+---------+-----------+----------+-------------------+  PTV       Full                                                                  +---------+---------------+---------+-----------+----------+-------------------+  PERO                                                       Not well visualized  +---------+---------------+---------+-----------+----------+-------------------+   +--------+---------------+---------+-----------+----------+--------------------+  LEFT     Compressibility Phasicity Spontaneity Properties Thrombus Aging        +--------+---------------+---------+-----------+----------+--------------------+  CFV      Full            Yes       Yes                                          +--------+---------------+---------+-----------+----------+--------------------+  SFJ      Full                                                                   +--------+---------------+---------+-----------+----------+--------------------+  FV Prox  Full                                                                   +--------+---------------+---------+-----------+----------+--------------------+  FV Mid   Full                                                                    +--------+---------------+---------+-----------+----------+--------------------+  FV       Full            Yes       Yes                                           Distal                                                                          +--------+---------------+---------+-----------+----------+--------------------+  PFV      Full                                                                   +--------+---------------+---------+-----------+----------+--------------------+  POP      Full                                                                   +--------+---------------+---------+-----------+----------+--------------------+  PTV      Full                                             patent by color                                                                  doppler               +--------+---------------+---------+-----------+----------+--------------------+  PERO  Not well visualized   +--------+---------------+---------+-----------+----------+--------------------+     Summary: RIGHT: - There is no evidence of deep vein thrombosis in the lower extremity. However, portions of this examination were limited- see technologist comments above.  - No cystic structure found in the popliteal fossa.  LEFT: - There is no evidence of deep vein thrombosis in the lower extremity. However, portions of this examination were limited- see technologist comments above.  - No cystic structure found in the popliteal fossa.  *See table(s) above for measurements and observations. Electronically signed by Jamelle Haring on 11/26/2021 at 8:57:21 PM.    Final     Cardiac Studies   Bilateral venous DVT: no evidence of DVT in bilateral LE  Patient Profile     71 y.o. female with [place] STEMI 10/26/21, CHB, now s/p CABG. Presenting with shortness of breath and PE.  Assessment & Plan    Acute subsegmental PE: -management per primary team -had post op anemia, need to  watch CBC, trending down -transition to DOAC, timing per primary team  Concern for pulmonary hypertension: -with clear lungs, severe LE edema, concern that this is primarily right sided -CT noted PA enlargement to 4.5 cm -echo with flattened septum, RVSP 44 mmHg, mod-severe TR, hypokinesis of RV wall, severely enlarged RV size (all preop and before PE) -has not had right heart cath. May need to consider in the future if edema remains difficult to manage, but would not do acutely given PE -suspect that with elevated right sided pressures, low albumin, it will be difficult to manage her edema long term and balance her hypotension -with overnight hypotension, would monitor BP. Restart IV lasix (would start with 40 mg first to make sure she makes urine/doesn't bottom BP) this afternoon if BP improved  Hypotension: -had hypotension overnight, given small IV fluid bolus -diuresed yesterday, but would hold for now -low albumin, likely component of third spacing -Hgb from 8.3 to 9.7  Recent STEMI, s/p CABG 4v -need to continue clopidogrel given use of cryovein for conduit -continue rosuvastatin and metoprolol  Post op paroxysmal atrial fibrillation -continue amiodarone  Multiple medical comorbidities, high risk conditions. Complex medical decision making.  For questions or updates, please contact Bernice Please consult [email or web address] for contact info under        Signed, Buford Dresser, MD  11/27/2021, 9:20 AM

## 2021-11-27 NOTE — Assessment & Plan Note (Signed)
Continue Synthroid °

## 2021-11-27 NOTE — Hospital Course (Addendum)
Deborah Cooke is a 71 y.o. female with past medical history of myocardial infarction status post CABG in October 27, 2021 who was discharged to rehab and subsequently went home 4 days back presented to hospital with decreased ambulation, increasing shortness of breath and generalized weakness with lower extremity edema.  Patient's son also noted that that she was having some jerks of her extremities.  Patient was then brought into the hospital.  In the ED, CT angiogram of the chest showed small segmental right upper lobe and lower lobe pulmonary embolism.  Dopplers were negative for DVT.  Cardiology was consulted.  Patient was admitted to the hospital for  further management of acute pulm embolism and evaluation of generalized weakness and shortness of breath..  Patient was seen by cardiology during hospitalization and was started on heparin drip.  Due to significant interaction with carbamazepine Coumadin was initiated instead of DOAC.

## 2021-11-27 NOTE — Assessment & Plan Note (Addendum)
With right-sided heart failure and concern for pulmonary hypertension.  Patient does have peripheral edema.  Has not had a heart cath in the past.  Might need sleep study as outpatient for obstructive sleep apnea.  trial of Lasix today.  Cardiology on board.  Continue beta-blockers, Plavix.  No acute issues

## 2021-11-27 NOTE — Progress Notes (Signed)
ANTICOAGULATION CONSULT NOTE ? ?Pharmacy Consult for Heparin ?Indication: pulmonary embolus ? ?Allergies  ?Allergen Reactions  ? Nsaids Nausea And Vomiting and Other (See Comments)  ?  GI Upset (ibuprofen included)  ? Ibuprofen Other (See Comments)  ?  Other reaction(s): Abdominal Pain  ? Tolmetin Other (See Comments)  ?  Other reaction(s): Abdominal Pain ?  ? ? ?Patient Measurements: ?Height: '4\' 11"'$  (149.9 cm) ?Weight: 111.1 kg (244 lb 14.9 oz) ?IBW/kg (Calculated) : 43.2 ?Heparin Dosing Weight: 72 kg ? ?Vital Signs: ?Temp: 98.6 ?F (37 ?C) (03/10 1152) ?Temp Source: Oral (03/10 1152) ?BP: 115/88 (03/10 1152) ?Pulse Rate: 86 (03/10 1152) ? ?Labs: ?Recent Labs  ?  11/26/21 ?1119 11/26/21 ?1310 11/26/21 ?2156 11/27/21 ?0204 11/27/21 ?1157  ?HGB 9.7*  --   --  8.3*  --   ?HCT 30.8*  --   --  25.8*  --   ?PLT 373  --   --  304  --   ?LABPROT  --  13.7  --   --   --   ?INR  --  1.1  --   --   --   ?HEPARINUNFRC  --   --  0.22* 0.27* 0.33  ?CREATININE 1.17*  --   --  1.14*  --   ?TROPONINIHS 54* 50*  --   --   --   ? ? ? ?Estimated Creatinine Clearance: 48.8 mL/min (A) (by C-G formula based on SCr of 1.14 mg/dL (H)). ? ? ?Medical History: ?Past Medical History:  ?Diagnosis Date  ? Adverse effect of anesthetic 01/2012  ? difficulty waking up.  ? Adverse effect of unspecified anesthetic, initial encounter 08/2011  ? low o2 sats.  ? Anemia   ? Anxiety   ? Arthritis   ? Breast cancer ([place]) 02/16/2017  ? left breast  ? Chronic pain   ? Closed supracondylar fracture of right femur ([place]) 09/18/2012  ? Depression   ? Family history of breast cancer   ? GERD (gastroesophageal reflux disease)   ? pt denies  ? Headache(784.0)   ? Hypertension   ? Hypothyroidism   ? Opioid dependence in controlled environment [facility])   ? Osteoporosis   ? Osteoporosis with fracture 09/19/2012  ? Pneumonia   ? Thyroid disease   ? ? ? 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 Patient is presenting with SOB. Heparin per pharmacy consult placed for pulmonary embolus.  EDP consult CT surg who is ok with heparin from their perspective. ? ?CTA PE w/ small segmental acute PE in rt upper and lower lobe. Patient is not on anticoagulation prior to arrival. ? ?Hgb down slightly to 8.3, platelet count okay.  Heparin level this morning within goal range.  No overt bleeding or complications noted.   ? ?Goal of Therapy:  ?Heparin level 0.3-0.7 units/ml ?Monitor platelets by anticoagulation protocol: Yes ?  ?Plan:  ?Continue IV heparin at 1450 units/hr ?Daily heparin level and CBC ?F/u plans for DOAC soon? ? ?[place] Crane, Vena [place], BCPS, BCCP ?Clinical Pharmacist ? 11/27/2021 1:17 PM  ? ?[facility] pharmacy phone numbers are listed on [email or web address] ? ? ?

## 2021-11-28 ENCOUNTER — Other Ambulatory Visit: Payer: Self-pay

## 2021-11-28 DIAGNOSIS — R600 Localized edema: Secondary | ICD-10-CM

## 2021-11-28 LAB — CBC
HCT: 27.2 % — ABNORMAL LOW (ref 36.0–46.0)
Hemoglobin: 8.6 g/dL — ABNORMAL LOW (ref 12.0–15.0)
MCH: 30.7 pg (ref 26.0–34.0)
MCHC: 31.6 g/dL (ref 30.0–36.0)
MCV: 97.1 fL (ref 80.0–100.0)
Platelets: 307 10*3/uL (ref 150–400)
RBC: 2.8 MIL/uL — ABNORMAL LOW (ref 3.87–5.11)
RDW: 15.9 % — ABNORMAL HIGH (ref 11.5–15.5)
WBC: 6 10*3/uL (ref 4.0–10.5)
nRBC: 0 % (ref 0.0–0.2)

## 2021-11-28 LAB — BASIC METABOLIC PANEL
Anion gap: 8 (ref 5–15)
BUN: 22 mg/dL (ref 8–23)
CO2: 29 mmol/L (ref 22–32)
Calcium: 8.3 mg/dL — ABNORMAL LOW (ref 8.9–10.3)
Chloride: 100 mmol/L (ref 98–111)
Creatinine, Ser: 0.94 mg/dL (ref 0.44–1.00)
GFR, Estimated: >60 mL/min (ref >60)
Glucose, Bld: 103 mg/dL — ABNORMAL HIGH (ref 70–99)
Potassium: 4.4 mmol/L (ref 3.5–5.1)
Sodium: 137 mmol/L (ref 135–145)

## 2021-11-28 LAB — MAGNESIUM: Magnesium: 1.9 mg/dL (ref 1.7–2.4)

## 2021-11-28 LAB — HEPARIN LEVEL (UNFRACTIONATED): Heparin Unfractionated: 0.31 IU/mL (ref 0.30–0.70)

## 2021-11-28 NOTE — Progress Notes (Signed)
ANTICOAGULATION CONSULT NOTE ? ?Pharmacy Consult for Heparin ?Indication: pulmonary embolus ? ?Allergies  ?Allergen Reactions  ? Nsaids Nausea And Vomiting and Other (See Comments)  ?  GI Upset (ibuprofen included)  ? Ibuprofen Other (See Comments)  ?  Other reaction(s): Abdominal Pain  ? Tolmetin Other (See Comments)  ?  Other reaction(s): Abdominal Pain ?  ? ? ?Patient Measurements: ?Height: '4\' 11"'$  (149.9 cm) ?Weight: 111.7 kg (246 lb 4.1 oz) ?IBW/kg (Calculated) : 43.2 ?Heparin Dosing Weight: 72 kg ? ?Vital Signs: ?Temp: 98.1 ?F (36.7 ?C) (03/11 0535) ?Temp Source: Oral (03/11 0535) ?BP: 115/64 (03/11 0834) ?Pulse Rate: 88 (03/11 0834) ? ?Labs: ?Recent Labs  ?  11/26/21 ?1119 11/26/21 ?1310 11/26/21 ?2156 11/27/21 ?0204 11/27/21 ?1157 11/28/21 ?2751  ?HGB 9.7*  --   --  8.3*  --  8.6*  ?HCT 30.8*  --   --  25.8*  --  27.2*  ?PLT 373  --   --  304  --  307  ?LABPROT  --  13.7  --   --   --   --   ?INR  --  1.1  --   --   --   --   ?HEPARINUNFRC  --   --    < > 0.27* 0.33 0.31  ?CREATININE 1.17*  --   --  1.14*  --  0.94  ?TROPONINIHS 54* 50*  --   --   --   --   ? < > = values in this interval not displayed.  ? ? ? ?Estimated Creatinine Clearance: 59.4 mL/min (by C-G formula based on SCr of 0.94 mg/dL). ? ? ?Medical History: ?Past Medical History:  ?Diagnosis Date  ? Adverse effect of anesthetic 01/2012  ? difficulty waking up.  ? Adverse effect of unspecified anesthetic, initial encounter 08/2011  ? low o2 sats.  ? Anemia   ? Anxiety   ? Arthritis   ? Breast cancer ([place]) 02/16/2017  ? left breast  ? Chronic pain   ? Closed supracondylar fracture of right femur ([place]) 09/18/2012  ? Depression   ? Family history of breast cancer   ? GERD (gastroesophageal reflux disease)   ? pt denies  ? Headache(784.0)   ? Hypertension   ? Hypothyroidism   ? Opioid dependence in controlled environment [facility])   ? Osteoporosis   ? Osteoporosis with fracture 09/19/2012  ? Pneumonia   ? Thyroid disease   ? ? ? 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 Patient is presenting with SOB. Heparin per pharmacy consult placed for pulmonary embolus.  EDP consult CT surg who is ok with heparin from their perspective. ? ?CTA PE w/ small segmental acute PE in rt upper and lower lobe. Patient is not on anticoagulation prior to arrival. ? ?Heparin level 0.31 and on lower end of goal. Will proactively increase by 50 units/h to keep within range. Hgb slightly decreased but continues to remain stable at 8.6, platelets are within normal limits. No signs of bleeding noted.  ? ?Goal of Therapy:  ?Heparin level 0.3-0.7 units/ml ?Monitor platelets by anticoagulation protocol: Yes ?  ?Plan:  ?Increase IV heparin gtt to 1500 units/h ?Daily heparin level, CBC ?Monitor for signs and symptoms of bleeding ?F/u plans for oral anticoagulation ? ?Thank you for involving pharmacy in this patient's care. ? ?Elita Quick, PharmD ?PGY1 Ambulatory Care Pharmacy Resident ?11/28/2021  10:31 AM ? ?**Pharmacist phone directory can be found on [email or web address] listed under [place]** ?

## 2021-11-28 NOTE — Social Work (Signed)
CSW acknowledges consult for SNF/HH. The patient will require PT/OT evaluations. TOC will assist with disposition planning once the evaluations have been completed.  °  °TOC will continue to follow.    °

## 2021-11-28 NOTE — Evaluation (Signed)
Occupational Therapy Evaluation Patient Details Name: Deborah Cooke MRN: [id number] DOB: 07-28-1951 Today's Date: 11/28/2021   History of Present Illness 71 yo female admitted 3/9 with SOB and acute PE. PMhx: admission 10/26/21 with STEMI and fall at home. S/p CABGx 4 10/27/21, Rt TSA 10/20/21, breast CA, depression, anxiety, anemia, HTN, thyroid dz, Left femur ORIF   Clinical Impression   Pt motivated and agreeable for OT session this AM, currently presents with decreased activity tolerance, restriction from recent CABG and R TSA, and strength, balance deficits leading to need for increased A up to min for transfers and min-mod A grossly for completion of ADLs at this time. Pt requiring intermittent rest breaks for participation this date, and was receptive to pursed lip breathing. O2 sats >=93% with O2 via . OT introduced ECT's in relation to completion of self care tasks. Pt verbalizing concerns for d/c to home as husband is away periodically as he drive truck, and currently pt with increased need for A for transfers, mobility and ADLs. OT will continue to follow, but anticipate pt may benefit from CIR for post acute rehab after d/c to maximize Indep and safety prior to returning to home.      Recommendations for follow up therapy are one component of a multi-disciplinary discharge planning process, led by the attending physician.  Recommendations may be updated based on patient status, additional functional criteria and insurance authorization.   Follow Up Recommendations  Acute inpatient rehab (3hours/day)    Assistance Recommended at Discharge Intermittent Supervision/Assistance  Patient can return home with the following A lot of help with bathing/dressing/bathroom    Functional Status Assessment  Patient has had a recent decline in their functional status and demonstrates the ability to make significant improvements in function in a reasonable and predictable amount of time.  Equipment  Recommendations  None recommended by OT (will defer to next level of care)    Recommendations for Other Services Rehab consult     Precautions / Restrictions Precautions Precautions: Sternal;Fall;Shoulder Type of Shoulder Precautions: R TSA 1/31, sling discontinued Shoulder Interventions: Shoulder sling/immobilizer;Off for dressing/bathing/exercises Restrictions Weight Bearing Restrictions: Yes RUE Weight Bearing: Non weight bearing Other Position/Activity Restrictions: cleared for use of RW, sternal precuations in place      Mobility Bed Mobility               General bed mobility comments: n/a received and returned to sitting in recliner    Transfers Overall transfer level: Needs assistance Equipment used: None, Rolling walker (2 wheels) Transfers: Sit to/from Stand, Bed to chair/wheelchair/BSC Sit to Stand: Min assist Stand pivot transfers: Min assist         General transfer comment: min A for transition to and from standing, CGA for completion of pivot and cues for use of UE support for steadying      Balance                                           ADL either performed or assessed with clinical judgement   ADL Overall ADL's : Needs assistance/impaired Eating/Feeding: Set up;Sitting   Grooming: Set up;Sitting   Upper Body Bathing: Minimal assistance;Sitting   Lower Body Bathing: Maximal assistance;Sitting/lateral leans;Sit to/from stand Lower Body Bathing Details (indicate cue type and reason): limited ability for forward flexion this date for bathing d/t increased SOB with activity Upper Body Dressing :  Minimal assistance;Sitting   Lower Body Dressing: Moderate assistance;Sit to/from stand   Toilet Transfer: Minimal assistance;Cueing for sequencing   Toileting- Clothing Manipulation and Hygiene: Minimal assistance;Sit to/from stand         General ADL Comments: pt requiring CGA-min A for toileting transfers and standing  tasks; limited tolerance and reporting feeling SOB leading ot need for frequent rest breaks and increased A required.     Vision         Perception     Praxis      Pertinent Vitals/Pain Pain Assessment Pain Assessment: No/denies pain     Hand Dominance Right   Extremity/Trunk Assessment Upper Extremity Assessment Upper Extremity Assessment: Overall WFL for tasks assessed (deferred formal assessment to R UE d/t restrictions and NWB status. LUE WFL) RUE Deficits / Details: TSA on 10/20/21. No shoulder ROM. Cleared for hand, wrist, and elbow- reporting possible clearance for active shoulder flexion but unclear   Lower Extremity Assessment Lower Extremity Assessment: Defer to PT evaluation   Cervical / Trunk Assessment Cervical / Trunk Assessment: Other exceptions Cervical / Trunk Exceptions: increased body habitus   Communication Communication Communication: No difficulties   Cognition Arousal/Alertness: Awake/alert Behavior During Therapy: WFL for tasks assessed/performed, Anxious Overall Cognitive Status: Within Functional Limits for tasks assessed                                       General Comments  O2 via [place] on throughout session    Exercises     Shoulder Instructions      Home Living Family/patient expects to be discharged to:: Private residence Living Arrangements: Spouse/significant other (husband drive truck and is aware for periods of time) Available Help at Discharge: Family;Available PRN/intermittently Type of Home: House Home Access: Level entry     Home Layout: One level     Bathroom Shower/Tub: Occupational psychologist: Standard     Home Equipment: Cane - single Barista (2 wheels)   Additional Comments: D/c from rehab 2 days before readmission      Prior Functioning/Environment Prior Level of Function : Needs assist             Mobility Comments: using RW ADLs Comments: pt endorses at time of  d/c from rehab she was requiring increased A d/t fatigue. baseline otherwise was indep        OT Problem List: Decreased strength;Decreased range of motion;Decreased activity tolerance;Impaired balance (sitting and/or standing);Decreased knowledge of use of DME or AE;Decreased knowledge of precautions      OT Treatment/Interventions: Self-care/ADL training;Therapeutic exercise;Energy conservation;DME and/or AE instruction;Therapeutic activities;Patient/family education    OT Goals(Current goals can be found in the care plan section) Acute Rehab OT Goals Patient Stated Goal: to be able to care for myself OT Goal Formulation: With patient Time For Goal Achievement: 12/12/21 Potential to Achieve Goals: Good ADL Goals Pt Will Perform Upper Body Dressing: with modified independence;sitting Pt Will Perform Lower Body Dressing: with modified independence;sit to/from stand Pt Will Transfer to Toilet: with modified independence Pt Will Perform Toileting - Clothing Manipulation and hygiene: with modified independence;sit to/from stand Pt Will Perform Tub/Shower Transfer: with supervision;tub bench Additional ADL Goal #1: pt will be able to verbalize and demo 3 ECTs for decreased fatigue with participation with basic ADLs  OT Frequency: Min 2X/week    Co-evaluation  AM-PAC OT "6 Clicks" Daily Activity     Outcome Measure Help from another person eating meals?: A Little Help from another person taking care of personal grooming?: A Little Help from another person toileting, which includes using toliet, bedpan, or urinal?: A Lot Help from another person bathing (including washing, rinsing, drying)?: A Lot Help from another person to put on and taking off regular upper body clothing?: A Little Help from another person to put on and taking off regular lower body clothing?: A Lot 6 Click Score: 15   End of Session Equipment Utilized During Treatment: Oxygen  Activity Tolerance:  Patient limited by fatigue Patient left: in chair;with call bell/phone within reach;with chair alarm set  OT Visit Diagnosis: Unsteadiness on feet (R26.81);Other abnormalities of gait and mobility (R26.89);Muscle weakness (generalized) (M62.81)                Time: 5956-3875 OT Time Calculation (min): 24 min Charges:  OT General Charges $OT Visit: 1 Visit OT Evaluation $OT Eval Low Complexity: 1 Low Verle [place] OTR/L acute rehab services Office: [place] 11/28/2021, 2:14 PM

## 2021-11-28 NOTE — Progress Notes (Signed)
PROGRESS NOTE    Deborah Cooke  NKN:[id number] DOB: 02-04-1951 DOA: 11/26/2021 PCP: Rich Fuchs, PA    Brief Narrative:   Deborah Cooke is a 71 y.o. female with past medical history of myocardial infarction status post CABG in October 27, 2021 who was discharged to rehab and subsequently went home 4 days back presented to hospital with decreased ambulation, increasing shortness of breath and generalized weakness with lower extremity edema.  Patient's son also noted that that she was having some jerks of her extremities.  Patient was then brought into the hospital.  In the ED CT angiogram of the chest showed small segmental right upper lobe and lower lobe pulmonary embolism.  Dopplers were negative for DVT.  Cardiology was consulted.  Patient admitted for further management of acute pulm embolism and evaluation of generalized weakness and shortness of breath..  Patient was seen by cardiology during hospitalization and was initiated initially on heparin drip. Anticoagulation has been transitioned to Eliquis starting 11/28/2021     Assessment and Plan: * Acute pulmonary embolism ([place]) Likely secondary to sedentary status at home.  Dopplers were negative for DVT.  On IV heparin at this time.  Will transition to Eliquis today.  Discontinue IV heparin.  On supplemental oxygen by nasal cannula.  Will wean as able  Myoclonic jerking Improved at this time.  Seen by neurology.  EEG unremarkable for seizures.  Patient is on Tegretol as outpatient.  CT head was unremarkable.  ABG unremarkable.  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  Patient does have peripheral edema.  Has not had a heart cath in the past.  Might need sleep study as outpatient for obstructive sleep apnea.  Consider retrial of Lasix.  Cardiology on board.  Continue beta-blockers, Plavix.  No acute issues  Hypertension Improvement of blood pressure at this time.  Was transiently hypotensive and  required IV fluids.  On long-acting metoprolol at this time.  Malignant neoplasm of upper-outer quadrant of left breast in female, estrogen receptor positive ([place]) In remission.  On anastrozole.  Hypothyroidism Continue Synthroid.  S/P reverse total shoulder arthroplasty, right Continue supportive care.  Off sling recently.  Chronic pain On Flexeril, gabapentin.  Add as needed Percocet.  AKI (acute kidney injury) (HCC) Mild.  Improved at this time.  Creatinine of 0.9 at this time  Generalized weakness Debility, deconditioning.  Patient had ambulatory dysfunction at home though she was recently admitted and discharged from a rehabilitation.  Family unable to take care of her at home.  Pending PT OT evaluation.  Might need skilled nursing facility placement on discharge..  TOC has been consulted.    DVT prophylaxis:   Heparin drip, will transition to Eliquis.  Code Status:     Code Status: Full Code  Disposition: Likely to skilled nursing facility, PT pending  Status is: Inpatient  Remains inpatient appropriate because: Pulmonary embolism on anticoagulation, extreme debility weakness likely needing rehabilitation, on supplemental oxygen   Family Communication:  Spoke with the patient's son at bedside.  Consultants:  Cardiology Neurology  Procedures:  None  Antimicrobials:  None  Anti-infectives (From admission, onward)    None      Subjective: Today, patient was seen and examined at bedside.  Still complains of shortness of breath but denies any chest pain cough or hemoptysis.  Has generalized weakness.  Has nasal stuffness.  Objective: Vitals:   11/27/21 2026 11/28/21 0010 11/28/21 0535 11/28/21 0834  BP:  131/89 104/71 (!) 91/53 115/64  Pulse: 87 82 77 88  Resp: '20 19 17   '$ Temp: 98.5 F (36.9 C) 98.1 F (36.7 C) 98.1 F (36.7 C)   TempSrc: Oral Oral Oral   SpO2: 98% 100% 99%   Weight:   111.7 kg   Height:        Intake/Output Summary (Last 24  hours) at 11/28/2021 1120 Last data filed at 11/28/2021 0500 Gross per 24 hour  Intake 333.29 ml  Output --  Net 333.29 ml   Filed Weights   11/26/21 1713 11/27/21 0359 11/28/21 0535  Weight: 111.8 kg 111.1 kg 111.7 kg   Body mass index is 49.74 kg/m.   Physical Examination:  General: Obese built, not in obvious distress, on nasal cannula oxygen HENT:   No scleral pallor or icterus noted. Oral mucosa is moist.  Chest:    Diminished breath sounds bilaterally. No crackles or wheezes.  CVS: S1 &S2 heard. No murmur.  Regular rate and rhythm.  Recent CABG scar noted. Abdomen: Soft, nontender, nondistended.  Bowel sounds are heard.   Extremities: No cyanosis, clubbing with lower extremity edema++  Peripheral pulses are palpable.  Right shoulder scar noted. Psych: Alert, awake and oriented, normal mood CNS:  No cranial nerve deficits.  Generalized weakness of the extremities. Skin: Warm and dry.  No rashes noted.   Data Reviewed:   CBC: Recent Labs  Lab 11/26/21 1119 11/27/21 0204 11/28/21 0213  WBC 7.3 6.4 6.0  NEUTROABS 5.2  --   --   HGB 9.7* 8.3* 8.6*  HCT 30.8* 25.8* 27.2*  MCV 99.0 97.4 97.1  PLT 373 304 219    Basic Metabolic Panel: Recent Labs  Lab 11/26/21 1119 11/27/21 0204 11/28/21 0213  NA 136 137 137  K 3.8 3.9 4.4  CL 96* 101 100  CO2 '27 28 29  '$ GLUCOSE 92 94 103*  BUN 27* 24* 22  CREATININE 1.17* 1.14* 0.94  CALCIUM 8.8* 8.1* 8.3*  MG 2.1  --  1.9    Liver Function Tests: Recent Labs  Lab 11/26/21 1815 11/27/21 0204  AST 17 14*  ALT 14 11  ALKPHOS 139* 109  BILITOT 0.5 0.4  PROT 6.1* 5.0*  ALBUMIN 2.8* 2.4*     Radiology Studies: CT HEAD WO CONTRAST (5MM)  Result Date: 11/26/2021 CLINICAL DATA:  Altered mental status. EXAM: CT HEAD WITHOUT CONTRAST TECHNIQUE: Contiguous axial images were obtained from the base of the skull through the vertex without intravenous contrast. RADIATION DOSE REDUCTION: This exam was performed according to the  departmental dose-optimization program which includes automated exposure control, adjustment of the mA and/or kV according to patient size and/or use of iterative reconstruction technique. COMPARISON:  March 13, 2020. FINDINGS: Brain: Mild diffuse cortical atrophy is noted. No mass effect or midline shift is noted. Ventricular size is within normal limits. There is no evidence of mass lesion, hemorrhage or acute infarction. Vascular: No hyperdense vessel or unexpected calcification. Skull: Normal. Negative for fracture or focal lesion. Sinuses/Orbits: No acute finding. Other: None. IMPRESSION: No acute intracranial abnormality seen. Electronically Signed   By: Marijo Conception M.D.   On: 11/26/2021 16:01   CT Angio Chest PE W/Cm &/Or Wo Cm  Result Date: 11/26/2021 CLINICAL DATA:  Worsening shortness of breath today. CABG on 08/26/2022. Right shoulder total reverse arthroplasty 10/20/2021. Bilateral lower extremity edema. EXAM: CT ANGIOGRAPHY CHEST WITH CONTRAST TECHNIQUE: Multidetector CT imaging of the chest was performed using the standard protocol during bolus  administration of intravenous contrast. Multiplanar CT image reconstructions and MIPs were obtained to evaluate the vascular anatomy. RADIATION DOSE REDUCTION: This exam was performed according to the departmental dose-optimization program which includes automated exposure control, adjustment of the mA and/or kV according to patient size and/or use of iterative reconstruction technique. CONTRAST:  48m OMNIPAQUE IOHEXOL 350 MG/ML SOLN COMPARISON:  Multiple exams, including chest radiograph 11/26/2021 FINDINGS: Body habitus reduces diagnostic sensitivity and specificity. Metal artifact from spinal and shoulder hardware also reduces signal to noise ratio, metal artifact reduction processing was employed. Cardiovascular: Prominent main pulmonary artery at 4.5 cm in transverse diameter on image 139 series 8, compatible with pulmonary arterial hypertension.  Right lower lobe posterior basal segmental acute pulmonary embolus observed on image 263, series 8. Suspected small segmental right upper lobe pulmonary embolus, image 151 series 8 and also separately on image 129 series 8. No lobar or larger pulmonary embolus identified. Coronary, aortic arch, and branch vessel atherosclerotic vascular disease. Prior CABG with continued edema along the anterior mediastinal soft tissues and around the sternum with a small pericardial effusion. Moderate cardiomegaly. Substantial mitral valve calcification and mild aortic valve calcifications. Mediastinum/Nodes: As noted above, there is continued edema in the anterior mediastinum around the sternum likely related to the patient's prior CABG. Edema tracks around the sternotomy site. 0.8 cm AP window lymph node on image 43 series 6, probably reactive. Lungs/Pleura: Mild mosaic attenuation in the lungs. There is likely some trace mucus in the trachea dependently. Left anterior subpleural reticulation, suggesting prior radiation therapy for left breast cancer. Trace left pleural effusion. Upper Abdomen: Previous gastric bypass. Musculoskeletal: Right total shoulder arthroplasty. Lower cervical fusion with fixation hardware. Healing sternotomy. Subacute fractures of the right anterior second, third, fourth, fifth, sixth, and seventh ribs, with some early healing response. Fractures of the left anterior third, fourth, fifth, sixth, and seventh ribs are more age indeterminate and could be chronic. Likely chronic superior endplate compression at L1. Review of the MIP images confirms the above findings. IMPRESSION: 1. Small segmental acute pulmonary emboli in the right upper lobe and right lower lobe pulmonary artery. 2. Prominent main pulmonary artery at 4.5 cm in transverse diameter indicating pulmonary arterial hypertension. Underlying cardiomegaly and coronary atherosclerosis noted. 3. Recent CABG with healing median sternotomy and  continued edema adjacent to the sternotomy site and anterior mediastinum. Small pericardial effusion. 4. Mild mosaic attenuation in the lungs, potentially reflecting a component of mild edema or bronchiolitis obliterans. 5. Subacute fractures of the right anterior second through seventh ribs. Likely older fractures of the left anterior third through seventh ribs. 6. Other imaging findings of potential clinical significance: Trace left pleural effusion. Radiation therapy related findings anteriorly in the left lung. Prior gastric bypass. Mitral valve and aortic valve calcification. Critical Value/emergent results were called by telephone at the time of interpretation on 11/26/2021 at 2:05 pm [facility] who verbally acknowledged these results. Electronically Signed   By: WVan ClinesM.D.   On: 11/26/2021 14:12   DG Chest Port 1 View  Result Date: 11/26/2021 CLINICAL DATA:  Provided history: Shortness of breath. EXAM: PORTABLE CHEST 1 VIEW COMPARISON:  Prior chest radiographs 11/06/2021 and earlier. FINDINGS: Prior median sternotomy/CABG. Unchanged cardiomegaly. No appreciable airspace consolidation or pulmonary edema. No evidence of pleural effusion or pneumothorax. No acute bony abnormality identified. Prior right shoulder arthroplasty. Partially imaged cervical spinal fusion hardware. Surgical clips within the upper abdomen. IMPRESSION: No evidence of acute cardiopulmonary abnormality. Cardiomegaly. Electronically Signed  By: Kellie Simmering D.O.   On: 11/26/2021 11:38   EEG adult  Result Date: 11/26/2021 Gwinda Maine, MD     11/26/2021 10:16 PM EEG Inpatient Electroencephalogram Report Clinical Neurophysiology Services Patient Name: Kenijah Benningfield Age: 71 y.o. Sex: female MRN: [id number] Date: 11/26/2021 Location: 6E19 Neurologist:  Lynnae Sandhoff, MD Clinical Information SHERRELLE PROCHAZKA is a 71 y.o. female shoulder surgery beginning of last month followed by having a heart attack and getting  four-vessel bypass.  She went to rehab and was just discharged from rehab 3 days ago.  She has been doing poorly at home.  Generally weak all over unsteady on her feet.  Has had multiple falls.  Uses walker.  Increased shortness of breath.  No cough abdominal pain vomiting diarrhea.  No urinary symptoms.  States hands are trembling and she cannot hold a cup of coffee or feed herself.  Son does not feel like she can manage at home.  Her husband is off for weeks at a time driving a truck. Medications Prior to Admission medications  Medication Sig Start Date End Date Taking? Authorizing Provider acetaminophen (TYLENOL) 325 MG tablet Take 2 tablets (650 mg total) by mouth every 6 (six) hours as needed for mild pain or moderate pain. 11/11/21  Yes Roddenberry, Arlis Porta, PA-C amiodarone (PACERONE) 200 MG tablet Take 1 tablet (200 mg total) by mouth daily. 11/11/21  Yes Roddenberry, Myron G, PA-C anastrozole (ARIMIDEX) 1 MG tablet TAKE 1 TABLET BY MOUTH  DAILY Patient taking differently: Take 1 mg by mouth daily. 08/21/21  Yes Truitt Merle, MD aspirin EC 81 MG EC tablet Take 1 tablet (81 mg total) by mouth daily. Swallow whole. 11/11/21  Yes Roddenberry, Myron G, PA-C B Complex Vitamins (B COMPLEX 100 PO) Take 1 tablet by mouth daily.   Yes [provider] Biotin 10000 MCG TABS Take 10,000 mcg by mouth daily.   Yes [provider] buPROPion (WELLBUTRIN XL) 300 MG 24 hr tablet Take 300 mg by mouth daily. 06/09/21  Yes [provider] Calcium Carbonate (CALCIUM 600 PO) Take 600 mg by mouth in the morning and at bedtime.   Yes [provider] carbamazepine (TEGRETOL XR) 200 MG 12 hr tablet Take 400 mg by mouth 2 (two) times daily. 07/26/18 12/26/21 Yes [provider] cholecalciferol (VITAMIN D) 400 units TABS tablet Take 400 Units by mouth daily.   Yes [provider] clopidogrel (PLAVIX) 75 MG tablet Take 1 tablet (75 mg total) by mouth daily. Do not stop unless directed by  cardiology or cardiothoracic surgery. 11/11/21  Yes Roddenberry, Arlis Porta, PA-C cyclobenzaprine (FLEXERIL) 10 MG tablet Take 1 tablet (10 mg total) by mouth 3 (three) times daily as needed for muscle spasms. 10/17/20  Yes McClung, Sarah A, PA-C DULoxetine (CYMBALTA) 30 MG capsule Take 30 mg by mouth 2 (two) times daily.   Yes [provider] ferrous sulfate 325 (65 FE) MG EC tablet Take 325 mg by mouth daily with breakfast.   Yes [provider] furosemide (LASIX) 40 MG tablet Take 1 tablet (40 mg total) by mouth daily. 11/11/21  Yes Roddenberry, Arlis Porta, PA-C gabapentin (NEURONTIN) 100 MG capsule Take 2 capsules (200 mg total) by mouth 3 (three) times daily. 11/11/21  Yes Roddenberry, Arlis Porta, PA-C latanoprost (XALATAN) 0.005 % ophthalmic solution Place 1 drop into both eyes at bedtime. 06/25/16  Yes [provider] levothyroxine (SYNTHROID) 88 MCG tablet Take 88 mcg by mouth daily.   Yes [provider] liothyronine (CYTOMEL) 25 MCG tablet Take 25 mcg by mouth daily. 10/28/21  Yes [provider] metoprolol succinate (TOPROL-XL) 25 MG 24 hr tablet Take 1 tablet (25 mg total) by mouth daily. 11/11/21  Yes Roddenberry, Arlis Porta, PA-C Multiple Vitamins-Iron (MULTIVITAMIN/IRON PO) Take 1 tablet by mouth daily.    Yes [provider] mupirocin ointment (BACTROBAN) 2 % Apply 1 application topically 3 (three) times daily as needed for rash or irritation. 10/10/21  Yes [provider] ondansetron (ZOFRAN) 4 MG tablet Take 1 tablet (4 mg total) by mouth every 8 (eight) hours as needed for nausea or vomiting. 10/20/21  Yes Merlene Pulling K, PA-C oxyCODONE-acetaminophen (PERCOCET) 10-325 MG tablet Take 1 tablet by mouth 4 (four) times daily.   Yes [provider] pantoprazole (PROTONIX) 40 MG tablet Take 1 tablet (40 mg total) by mouth daily. 11/12/21  Yes Roddenberry, Arlis Porta, PA-C polyethylene glycol (MIRALAX / GLYCOLAX) 17 g packet Take 17 g by mouth 2 (two) times  daily. Patient taking differently: Take 17 g by mouth daily. 10/21/20  Yes Patrecia Pour, MD potassium chloride SA (KLOR-CON M) 20 MEQ tablet Take 1 tablet (20 mEq total) by mouth daily. 11/11/21  Yes Roddenberry, Myron G, PA-C QUEtiapine (SEROQUEL) 100 MG tablet Take 2 tablets (200 mg total) by mouth at bedtime. 11/11/21  Yes Roddenberry, Arlis Porta, PA-C rosuvastatin (CRESTOR) 10 MG tablet Take 1 tablet (10 mg total) by mouth daily. 11/11/21  Yes Roddenberry, Arlis Porta, PA-C simethicone (MYLICON) 80 MG chewable tablet Chew 80 mg by mouth every 6 (six) hours as needed for flatulence.   Yes [provider] TRINTELLIX 20 MG TABS tablet Take 20 mg by mouth daily. 11/24/21  Yes [provider] Recording Conditions This is a routine less than one hour EEG recording.  The EEG was performed utilizing standard international 10-20 system of electrode placement, with additional channels monitored for eye movement.  One channel electrocardiogram was monitored.  Data were obtained, stored, and interpreted according to ACNS guidelines (J Clin Neurophysiol 2006;23(2):85-183) utilizing referential montage recording, with reformatting to longitudinal, transverse bipolar, and referential montages as necessary for interpretation, along with digital/automated EEG analysis.  Patient tolerated entire procedure well.  Photic stimulation and hyperventilation were utilized as activation procedures unless otherwise specified below. EEG Description Background Activity: The posterior dominant rhythm (PDR) was 8-'9Hz'$  and reactive. The predominant background consisted of normal voltage and normal anterior to posterior progression. Paroxysmal Abnormalities: None Drowsiness/Sleep: Normal fragmentation of background with occasional vertex waves appreciated during the recording. Stimulation: Hyperventilation and photic stimulation were not performed. ECG Recording: abundant artifact. Recording Quality: Minor muscle, motion, and eye movement  artifacts were occasionally noted. EEG Interpretation Ictal Events: None 2.   Interictal Abnormalities: none 3.   Non-Epileptic Events: none Clinical Correlation This is a normal EEG. No interictal epileptiform abnormalities were recorded.  The absence of epileptiform abnormalities does not preclude a clinical diagnosis of seizures.   Electronically signed by: Lynnae Sandhoff, MD Page: [id number] 11/26/2021, 9:50 PM   VAS [place] LOWER EXTREMITY VENOUS (DVT)  Result Date: 11/26/2021  Lower Venous DVT Study Patient Name:  SHAGUN WORDELL  Date of Exam:   11/26/2021 Medical Rec #: [id number]     Accession #:    [id number] Date of Birth: 1951-06-25     Patient Gender: F Patient Age:   14 years Exam Location:  [facility] Procedure:      VAS [place] LOWER EXTREMITY VENOUS (DVT) Referring Phys: Gean Birchwood --------------------------------------------------------------------------------  Indications: Pulmonary embolism.  Limitations: Body habitus and poor ultrasound/tissue interface. Comparison Study: no prior Performing Technologist: Archie Patten RVS  Examination Guidelines: A complete evaluation includes B-mode imaging, spectral Doppler, color Doppler, and power Doppler as needed of all accessible portions of each vessel. Bilateral testing is considered an integral part of a complete examination. Limited examinations for reoccurring indications may be performed as noted. The reflux portion of the exam is performed with the patient in reverse Trendelenburg.  +---------+---------------+---------+-----------+----------+-------------------+  RIGHT     Compressibility Phasicity Spontaneity Properties Thrombus Aging       +---------+---------------+---------+-----------+----------+-------------------+  CFV       Full            Yes       Yes                                         +---------+---------------+---------+-----------+----------+-------------------+  SFJ       Full                                                                   +---------+---------------+---------+-----------+----------+-------------------+  FV Prox   Full                                                                  +---------+---------------+---------+-----------+----------+-------------------+  FV Mid    Full                                                                  +---------+---------------+---------+-----------+----------+-------------------+  FV Distal Full            Yes       Yes                                         +---------+---------------+---------+-----------+----------+-------------------+  PFV       Full                                                                  +---------+---------------+---------+-----------+----------+-------------------+  POP       Full            Yes       Yes                                         +---------+---------------+---------+-----------+----------+-------------------+  PTV       Full                                                                  +---------+---------------+---------+-----------+----------+-------------------+  PERO                                                       Not well visualized  +---------+---------------+---------+-----------+----------+-------------------+   +--------+---------------+---------+-----------+----------+--------------------+  LEFT     Compressibility Phasicity Spontaneity Properties Thrombus Aging        +--------+---------------+---------+-----------+----------+--------------------+  CFV      Full            Yes       Yes                                          +--------+---------------+---------+-----------+----------+--------------------+  SFJ      Full                                                                   +--------+---------------+---------+-----------+----------+--------------------+  FV Prox  Full                                                                    +--------+---------------+---------+-----------+----------+--------------------+  FV Mid   Full                                                                   +--------+---------------+---------+-----------+----------+--------------------+  FV       Full            Yes       Yes                                           Distal                                                                          +--------+---------------+---------+-----------+----------+--------------------+  PFV  Full                                                                   +--------+---------------+---------+-----------+----------+--------------------+  POP      Full                                                                   +--------+---------------+---------+-----------+----------+--------------------+  PTV      Full                                             patent by color                                                                  doppler               +--------+---------------+---------+-----------+----------+--------------------+  PERO                                                      Not well visualized   +--------+---------------+---------+-----------+----------+--------------------+     Summary: RIGHT: - There is no evidence of deep vein thrombosis in the lower extremity. However, portions of this examination were limited- see technologist comments above.  - No cystic structure found in the popliteal fossa.  LEFT: - There is no evidence of deep vein thrombosis in the lower extremity. However, portions of this examination were limited- see technologist comments above.  - No cystic structure found in the popliteal fossa.  *See table(s) above for measurements and observations. Electronically signed by Jamelle Haring on 11/26/2021 at 8:57:21 PM.    Final       LOS: 1 day    Flora Lipps, MD [facility] 11/28/2021, 11:20 AM

## 2021-11-28 NOTE — Evaluation (Addendum)
Physical Therapy Evaluation ?Patient Details ?Name: Deborah Cooke ?MRN: [id number] ?DOB: 1950/10/07 ?Today's Date: 11/28/2021 ? ?History of Present Illness ? 71 yo female admitted 3/9 with SOB and acute PE. PMhx: admission 10/26/21 with STEMI and fall at home. 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 Pt admitted with above. Pt is known to this PT from prior admission. Pt presents with decreased functional mobility secondary to decreased cardiopulmonary endurance, generalized weakness, decreased functional use of RUE, impaired balance, and obesity. Pt requiring min assist (+2 safety) for transfers and ambulating ~16 ft with a walker and chair follow. Able to participate in seated exercises for strengthening and endurance. Pt very pleasant and motivated to participate. In light of various medical complexities (ortho, recent CABG, acute PE), feel pt could benefit from AIR to address deficits and maximize functional mobility. Additionally, pt demonstrates improved activity tolerance in comparison to previous admission.  ?   ? ?Recommendations for follow up therapy are one component of a multi-disciplinary discharge planning process, led by the attending physician.  Recommendations may be updated based on patient status, additional functional criteria and insurance authorization. ? ?Follow Up Recommendations Acute inpatient rehab (3hours/day) ? ?  ?Assistance Recommended at Discharge Frequent or constant Supervision/Assistance  ?Patient can return home with the following ? A little help with walking and/or transfers;A little help with bathing/dressing/bathroom;Assistance with cooking/housework;Assist for transportation ? ?  ?Equipment Recommendations BSC/3in1 (bariatric)  ?Recommendations for Other Services ? Rehab consult  ?  ?Functional Status Assessment Patient has had a recent decline in their functional status and demonstrates the  ability to make significant improvements in function in a reasonable and predictable amount of time.  ? ?  ?Precautions / Restrictions Precautions ?Precautions: Sternal;Fall;Shoulder ?Type of Shoulder Precautions: R TSA 1/31, sling discontinued ?Restrictions ?Weight Bearing Restrictions: Yes ?RUE Weight Bearing: Non weight bearing (ok to use RW)  ? ?  ? ?Mobility ? Bed Mobility ?Overal bed mobility: Needs Assistance ?Bed Mobility: Supine to Sit ?  ?  ?Supine to sit: Min assist ?  ?  ?General bed mobility comments: Increased time/effort, HOB elevated, light minA at trunk to assist to upright ?  ? ?Transfers ?Overall transfer level: Needs assistance ?Equipment used: None, Rolling walker (2 wheels) ?Transfers: Sit to/from Stand, Bed to chair/wheelchair/BSC ?Sit to Stand: Min assist ?Stand pivot transfers: Min assist ?  ?  ?  ?  ?General transfer comment: MinA to rise and steady, pivoting to [facility] towards left with hand over hand guidance. ?  ? ?Ambulation/Gait ?Ambulation/Gait assistance: Min assist, +2 safety/equipment ?Gait Distance (Feet): 16 Feet ?Assistive device: Rolling walker (2 wheels) ?Gait Pattern/deviations: Step-through pattern, Decreased stride length ?Gait velocity: decreased ?Gait velocity interpretation: <1.8 ft/sec, indicate of risk for recurrent falls ?  ?General Gait Details: Slow, mildly unsteady pace, minA for balance, close chair follow. Increased trunk flexion ? ?Stairs ?  ?  ?  ?  ?  ? ?Wheelchair Mobility ?  ? ?Modified Rankin (Stroke Patients Only) ?  ? ?  ? ?Balance Overall balance assessment: Needs assistance ?Sitting-balance support: No upper extremity supported, Feet supported ?Sitting balance-Leahy Scale: Fair ?  ?  ?Standing balance support: Bilateral upper extremity supported, During functional activity ?Standing balance-Leahy Scale: Poor ?  ?  ?  ?  ?  ?  ?  ?  ?  ?  ?  ?  ?   ? ? ? ?Pertinent Vitals/Pain Pain Assessment ?  Pain Assessment: No/denies pain  ? ? ?Home Living Family/patient  expects to be discharged to:: Private residence ?Living Arrangements: Spouse/significant other ?Available Help at Discharge: Family;Available PRN/intermittently ?Type of Home: House ?Home Access: Level entry ?  ?  ?  ?Home Layout: One level ?Home Equipment: Cane - single Barista (2 wheels) ?Additional Comments: D/c from rehab 2 days before readmission  ?  ?Prior Function Prior Level of Function : Needs assist ?  ?  ?  ?  ?  ?  ?Mobility Comments: using RW ?ADLs Comments: requiring assist ?  ? ? ?Hand Dominance  ? Dominant Hand: Right ? ?  ?Extremity/Trunk Assessment  ? Upper Extremity Assessment ?Upper Extremity Assessment: Defer to OT evaluation ?  ? ?Lower Extremity Assessment ?Lower Extremity Assessment: Generalized weakness ?  ? ?Cervical / Trunk Assessment ?Cervical / Trunk Assessment: Other exceptions ?Cervical / Trunk Exceptions: increased body habitus  ?Communication  ? Communication: No difficulties  ?Cognition Arousal/Alertness: Awake/alert ?Behavior During Therapy: [facility] for tasks assessed/performed, Anxious ?Overall Cognitive Status: Within Functional Limits for tasks assessed ?  ?  ?  ?  ?  ?  ?  ?  ?  ?  ?  ?  ?  ?  ?  ?  ?  ?  ?  ? ?  ?General Comments  VSS on 2L O2 ? ?  ?Exercises General Exercises - Upper Extremity ?Elbow Flexion: AROM, Right, 10 reps, Seated ?Wrist Flexion: AROM, Right, 10 reps, Seated ?Wrist Extension: AROM, Right, 10 reps, Seated ?General Exercises - Lower Extremity ?Ankle Circles/Pumps: Both, 15 reps, Seated ?Long Arc Quad: Both, 10 reps, Seated ?Hip Flexion/Marching: Both, Seated, 5 reps  ? ?Assessment/Plan  ?  ?PT Assessment Patient needs continued PT services  ?PT Problem List Decreased strength;Decreased mobility;Decreased safety awareness;Decreased activity tolerance;Decreased balance;Decreased knowledge of use of DME;Decreased knowledge of precautions;Cardiopulmonary status limiting activity ? ?   ?  ?PT Treatment Interventions Gait training;Balance  training;Therapeutic exercise;DME instruction;Cognitive remediation;Therapeutic activities;Patient/family education;Functional mobility training   ? ?PT Goals (Current goals can be found in the Care Plan section)  ?Acute Rehab PT Goals ?Patient Stated Goal: agreeable to rehab ?PT Goal Formulation: With patient ?Time For Goal Achievement: 12/12/21 ?Potential to Achieve Goals: Good ? ?  ?Frequency Min 3X/week ?  ? ? ?Co-evaluation   ?  ?  ?  ?  ? ? ?  ?AM-PAC PT "6 Clicks" Mobility  ?Outcome Measure Help needed turning from your back to your side while in a flat bed without using bedrails?: A Little ?Help needed moving from lying on your back to sitting on the side of a flat bed without using bedrails?: A Little ?Help needed moving to and from a bed to a chair (including a wheelchair)?: A Little ?Help needed standing up from a chair using your arms (e.g., wheelchair or bedside chair)?: A Little ?Help needed to walk in hospital room?: A Little ?Help needed climbing 3-5 steps with a railing? : Total ?6 Click Score: 16 ? ?  ?End of Session Equipment Utilized During Treatment: Gait belt ?Activity Tolerance: Patient tolerated treatment well ?Patient left: in chair;with call bell/phone within reach;with chair alarm set;with nursing/sitter in room ?Nurse Communication: Mobility status ?PT Visit Diagnosis: Other abnormalities of gait and mobility (R26.89);Difficulty in walking, not elsewhere classified (R26.2);Muscle weakness (generalized) (M62.81);Unsteadiness on feet (R26.81) ?  ? ?Time: 2979-8921 ?PT Time Calculation (min) (ACUTE ONLY): 33 min ? ? ?Charges:   PT Evaluation ?$PT Eval Moderate Complexity: 1 Mod ?PT Treatments ?$Therapeutic Exercise:  8-22 mins ?  ?   ? ? ?Wyona Almas, PT, DPT ?Acute Rehabilitation Services ?Pager [phone] ?Office [phone] ? ? ?Carloine Margo Aye ?11/28/2021, 1:31 PM ? ?

## 2021-11-28 NOTE — Progress Notes (Addendum)
Progress Note  Patient Name: Deborah Cooke Date of Encounter: 11/28/2021  [facility] Cardiologist: Larae Grooms, MD   Subjective   Continues to have SOB this morning. Feels weak. No chest pain.  Blood pressures better this morning 100-110s; low overnight to the 70-80s Cr improved 1.14>0.94 Hemoglobin stable 8.6  Inpatient Medications    Scheduled Meds:  amiodarone  200 mg Oral Daily   anastrozole  1 mg Oral Daily   buPROPion  300 mg Oral Daily   carbamazepine  400 mg Oral BID   cholecalciferol  400 Units Oral Daily   clopidogrel  75 mg Oral Daily   DULoxetine  30 mg Oral BID   ferrous sulfate  325 mg Oral Q breakfast   gabapentin  200 mg Oral TID   latanoprost  1 drop Both Eyes QHS   levothyroxine  88 mcg Oral Daily   liothyronine  25 mcg Oral Daily   metoprolol succinate  25 mg Oral Daily   potassium chloride SA  20 mEq Oral Daily   QUEtiapine  200 mg Oral QHS   rosuvastatin  10 mg Oral Daily   vortioxetine HBr  20 mg Oral Daily   Continuous Infusions:  heparin 1,450 Units/hr (11/27/21 2247)   PRN Meds: cyclobenzaprine, oxyCODONE-acetaminophen, simethicone   Vital Signs    Vitals:   11/27/21 2026 11/28/21 0010 11/28/21 0535 11/28/21 0834  BP: 131/89 104/71 (!) 91/53 115/64  Pulse: 87 82 77 88  Resp: '20 19 17   '$ Temp: 98.5 F (36.9 C) 98.1 F (36.7 C) 98.1 F (36.7 C)   TempSrc: Oral Oral Oral   SpO2: 98% 100% 99%   Weight:   111.7 kg   Height:        Intake/Output Summary (Last 24 hours) at 11/28/2021 0837 Last data filed at 11/28/2021 0500 Gross per 24 hour  Intake 333.29 ml  Output --  Net 333.29 ml    Last 3 Weights 11/28/2021 11/27/2021 11/26/2021  Weight (lbs) 246 lb 4.1 oz 244 lb 14.9 oz 246 lb 7.6 oz  Weight (kg) 111.7 kg 111.1 kg 111.8 kg      Telemetry    NSR with occasional PVC- Personally Reviewed  ECG    11/27/21 NSR, first degree AVB, iRBBB, inferior infarct  - Personally Reviewed  Physical Exam   GEN: No acute  distress.   Neck: No JVD appreciated but difficult body habitus Cardiac: RRR, no murmurs, rubs, or gallops.  Respiratory: Clear to auscultation bilaterally. GI: Soft, nontender, non-distended  MS: bilateral LE edema to thighs, 2+ pitting Neuro:  Nonfocal  Psych: Normal affect   Labs    High Sensitivity Troponin:   Recent Labs  Lab 11/26/21 1119 11/26/21 1310  TROPONINIHS 54* 50*      Chemistry Recent Labs  Lab 11/26/21 1119 11/26/21 1815 11/27/21 0204 11/28/21 0213  NA 136  --  137 137  K 3.8  --  3.9 4.4  CL 96*  --  101 100  CO2 27  --  28 29  GLUCOSE 92  --  94 103*  BUN 27*  --  24* 22  CREATININE 1.17*  --  1.14* 0.94  CALCIUM 8.8*  --  8.1* 8.3*  MG 2.1  --   --  1.9  PROT  --  6.1* 5.0*  --   ALBUMIN  --  2.8* 2.4*  --   AST  --  17 14*  --   ALT  --  14 11  --  ALKPHOS  --  139* 109  --   BILITOT  --  0.5 0.4  --   GFRNONAA 49*  --  51* >60  ANIONGAP 13  --  8 8     Lipids No results for input(s): CHOL, TRIG, HDL, LABVLDL, LDLCALC, CHOLHDL in the last 168 hours.  Hematology Recent Labs  Lab 11/26/21 1119 11/27/21 0204 11/28/21 0213  WBC 7.3 6.4 6.0  RBC 3.11* 2.65* 2.80*  HGB 9.7* 8.3* 8.6*  HCT 30.8* 25.8* 27.2*  MCV 99.0 97.4 97.1  MCH 31.2 31.3 30.7  MCHC 31.5 32.2 31.6  RDW 16.2* 16.0* 15.9*  PLT 373 304 307    Thyroid  Recent Labs  Lab 11/26/21 1815  TSH 5.546*     BNP Recent Labs  Lab 11/26/21 1119  BNP 482.4*     DDimer No results for input(s): DDIMER in the last 168 hours.   Radiology    CT HEAD WO CONTRAST (5MM)  Result Date: 11/26/2021 CLINICAL DATA:  Altered mental status. EXAM: CT HEAD WITHOUT CONTRAST TECHNIQUE: Contiguous axial images were obtained from the base of the skull through the vertex without intravenous contrast. RADIATION DOSE REDUCTION: This exam was performed according to the departmental dose-optimization program which includes automated exposure control, adjustment of the mA and/or kV according to  patient size and/or use of iterative reconstruction technique. COMPARISON:  March 13, 2020. FINDINGS: Brain: Mild diffuse cortical atrophy is noted. No mass effect or midline shift is noted. Ventricular size is within normal limits. There is no evidence of mass lesion, hemorrhage or acute infarction. Vascular: No hyperdense vessel or unexpected calcification. Skull: Normal. Negative for fracture or focal lesion. Sinuses/Orbits: No acute finding. Other: None. IMPRESSION: No acute intracranial abnormality seen. Electronically Signed   By: Marijo Conception M.D.   On: 11/26/2021 16:01   CT Angio Chest PE W/Cm &/Or Wo Cm  Result Date: 11/26/2021 CLINICAL DATA:  Worsening shortness of breath today. CABG on 08/26/2022. Right shoulder total reverse arthroplasty 10/20/2021. Bilateral lower extremity edema. EXAM: CT ANGIOGRAPHY CHEST WITH CONTRAST TECHNIQUE: Multidetector CT imaging of the chest was performed using the standard protocol during bolus administration of intravenous contrast. Multiplanar CT image reconstructions and MIPs were obtained to evaluate the vascular anatomy. RADIATION DOSE REDUCTION: This exam was performed according to the departmental dose-optimization program which includes automated exposure control, adjustment of the mA and/or kV according to patient size and/or use of iterative reconstruction technique. CONTRAST:  8m OMNIPAQUE IOHEXOL 350 MG/ML SOLN COMPARISON:  Multiple exams, including chest radiograph 11/26/2021 FINDINGS: Body habitus reduces diagnostic sensitivity and specificity. Metal artifact from spinal and shoulder hardware also reduces signal to noise ratio, metal artifact reduction processing was employed. Cardiovascular: Prominent main pulmonary artery at 4.5 cm in transverse diameter on image 139 series 8, compatible with pulmonary arterial hypertension. Right lower lobe posterior basal segmental acute pulmonary embolus observed on image 263, series 8. Suspected small segmental  right upper lobe pulmonary embolus, image 151 series 8 and also separately on image 129 series 8. No lobar or larger pulmonary embolus identified. Coronary, aortic arch, and branch vessel atherosclerotic vascular disease. Prior CABG with continued edema along the anterior mediastinal soft tissues and around the sternum with a small pericardial effusion. Moderate cardiomegaly. Substantial mitral valve calcification and mild aortic valve calcifications. Mediastinum/Nodes: As noted above, there is continued edema in the anterior mediastinum around the sternum likely related to the patient's prior CABG. Edema tracks around the sternotomy site. 0.8 cm AP window  lymph node on image 43 series 6, probably reactive. Lungs/Pleura: Mild mosaic attenuation in the lungs. There is likely some trace mucus in the trachea dependently. Left anterior subpleural reticulation, suggesting prior radiation therapy for left breast cancer. Trace left pleural effusion. Upper Abdomen: Previous gastric bypass. Musculoskeletal: Right total shoulder arthroplasty. Lower cervical fusion with fixation hardware. Healing sternotomy. Subacute fractures of the right anterior second, third, fourth, fifth, sixth, and seventh ribs, with some early healing response. Fractures of the left anterior third, fourth, fifth, sixth, and seventh ribs are more age indeterminate and could be chronic. Likely chronic superior endplate compression at L1. Review of the MIP images confirms the above findings. IMPRESSION: 1. Small segmental acute pulmonary emboli in the right upper lobe and right lower lobe pulmonary artery. 2. Prominent main pulmonary artery at 4.5 cm in transverse diameter indicating pulmonary arterial hypertension. Underlying cardiomegaly and coronary atherosclerosis noted. 3. Recent CABG with healing median sternotomy and continued edema adjacent to the sternotomy site and anterior mediastinum. Small pericardial effusion. 4. Mild mosaic attenuation in  the lungs, potentially reflecting a component of mild edema or bronchiolitis obliterans. 5. Subacute fractures of the right anterior second through seventh ribs. Likely older fractures of the left anterior third through seventh ribs. 6. Other imaging findings of potential clinical significance: Trace left pleural effusion. Radiation therapy related findings anteriorly in the left lung. Prior gastric bypass. Mitral valve and aortic valve calcification. Critical Value/emergent results were called by telephone at the time of interpretation on 11/26/2021 at 2:05 pm to provider [facility] , who verbally acknowledged these results. Electronically Signed   By: Van Clines M.D.   On: 11/26/2021 14:12   DG Chest Port 1 View  Result Date: 11/26/2021 CLINICAL DATA:  Provided history: Shortness of breath. EXAM: PORTABLE CHEST 1 VIEW COMPARISON:  Prior chest radiographs 11/06/2021 and earlier. FINDINGS: Prior median sternotomy/CABG. Unchanged cardiomegaly. No appreciable airspace consolidation or pulmonary edema. No evidence of pleural effusion or pneumothorax. No acute bony abnormality identified. Prior right shoulder arthroplasty. Partially imaged cervical spinal fusion hardware. Surgical clips within the upper abdomen. IMPRESSION: No evidence of acute cardiopulmonary abnormality. Cardiomegaly. Electronically Signed   By: Kellie Simmering D.O.   On: 11/26/2021 11:38   EEG adult  Result Date: 11/26/2021 Gwinda Maine, MD     11/26/2021 10:16 PM EEG Inpatient Electroencephalogram Report Clinical Neurophysiology Services Patient Name: Audree Schrecengost Age: 71 y.o. Sex: female MRN: [id number] Date: 11/26/2021 Location: 6E19 Neurologist:  Lynnae Sandhoff, MD Clinical Information KASHAWNA MANZER is a 71 y.o. female shoulder surgery beginning of last month followed by having a heart attack and getting four-vessel bypass.  She went to rehab and was just discharged from rehab 3 days ago.  She has been doing poorly at home.  Generally  weak all over unsteady on her feet.  Has had multiple falls.  Uses walker.  Increased shortness of breath.  No cough abdominal pain vomiting diarrhea.  No urinary symptoms.  States hands are trembling and she cannot hold a cup of coffee or feed herself.  Son does not feel like she can manage at home.  Her husband is off for weeks at a time driving a truck. Medications Prior to Admission medications  Medication Sig Start Date End Date Taking? Authorizing Provider acetaminophen (TYLENOL) 325 MG tablet Take 2 tablets (650 mg total) by mouth every 6 (six) hours as needed for mild pain or moderate pain. 11/11/21  Yes Roddenberry, Arlis Porta, PA-C amiodarone (PACERONE) 200 MG  tablet Take 1 tablet (200 mg total) by mouth daily. 11/11/21  Yes Roddenberry, Myron G, PA-C anastrozole (ARIMIDEX) 1 MG tablet TAKE 1 TABLET BY MOUTH  DAILY Patient taking differently: Take 1 mg by mouth daily. 08/21/21  Yes Truitt Merle, MD aspirin EC 81 MG EC tablet Take 1 tablet (81 mg total) by mouth daily. Swallow whole. 11/11/21  Yes Roddenberry, Myron G, PA-C B Complex Vitamins (B COMPLEX 100 PO) Take 1 tablet by mouth daily.   Yes [provider] Biotin 10000 MCG TABS Take 10,000 mcg by mouth daily.   Yes [provider] buPROPion (WELLBUTRIN XL) 300 MG 24 hr tablet Take 300 mg by mouth daily. 06/09/21  Yes [provider] Calcium Carbonate (CALCIUM 600 PO) Take 600 mg by mouth in the morning and at bedtime.   Yes [provider] carbamazepine (TEGRETOL XR) 200 MG 12 hr tablet Take 400 mg by mouth 2 (two) times daily. 07/26/18 12/26/21 Yes [provider] cholecalciferol (VITAMIN D) 400 units TABS tablet Take 400 Units by mouth daily.   Yes [provider] clopidogrel (PLAVIX) 75 MG tablet Take 1 tablet (75 mg total) by mouth daily. Do not stop unless directed by cardiology or cardiothoracic surgery. 11/11/21  Yes Roddenberry, Arlis Porta, PA-C cyclobenzaprine (FLEXERIL) 10 MG tablet Take 1 tablet (10 mg  total) by mouth 3 (three) times daily as needed for muscle spasms. 10/17/20  Yes McClung, Sarah A, PA-C DULoxetine (CYMBALTA) 30 MG capsule Take 30 mg by mouth 2 (two) times daily.   Yes [provider] ferrous sulfate 325 (65 FE) MG EC tablet Take 325 mg by mouth daily with breakfast.   Yes [provider] furosemide (LASIX) 40 MG tablet Take 1 tablet (40 mg total) by mouth daily. 11/11/21  Yes Roddenberry, Arlis Porta, PA-C gabapentin (NEURONTIN) 100 MG capsule Take 2 capsules (200 mg total) by mouth 3 (three) times daily. 11/11/21  Yes Roddenberry, Arlis Porta, PA-C latanoprost (XALATAN) 0.005 % ophthalmic solution Place 1 drop into both eyes at bedtime. 06/25/16  Yes [provider] levothyroxine (SYNTHROID) 88 MCG tablet Take 88 mcg by mouth daily.   Yes [provider] liothyronine (CYTOMEL) 25 MCG tablet Take 25 mcg by mouth daily. 10/28/21  Yes [provider] metoprolol succinate (TOPROL-XL) 25 MG 24 hr tablet Take 1 tablet (25 mg total) by mouth daily. 11/11/21  Yes Roddenberry, Arlis Porta, PA-C Multiple Vitamins-Iron (MULTIVITAMIN/IRON PO) Take 1 tablet by mouth daily.    Yes [provider] mupirocin ointment (BACTROBAN) 2 % Apply 1 application topically 3 (three) times daily as needed for rash or irritation. 10/10/21  Yes [provider] ondansetron (ZOFRAN) 4 MG tablet Take 1 tablet (4 mg total) by mouth every 8 (eight) hours as needed for nausea or vomiting. 10/20/21  Yes Merlene Pulling K, PA-C oxyCODONE-acetaminophen (PERCOCET) 10-325 MG tablet Take 1 tablet by mouth 4 (four) times daily.   Yes [provider] pantoprazole (PROTONIX) 40 MG tablet Take 1 tablet (40 mg total) by mouth daily. 11/12/21  Yes Roddenberry, Arlis Porta, PA-C polyethylene glycol (MIRALAX / GLYCOLAX) 17 g packet Take 17 g by mouth 2 (two) times daily. Patient taking differently: Take 17 g by mouth daily. 10/21/20  Yes Patrecia Pour, MD potassium chloride SA (KLOR-CON M) 20 MEQ tablet  Take 1 tablet (20 mEq total) by mouth daily. 11/11/21  Yes Roddenberry, Myron G, PA-C QUEtiapine (SEROQUEL) 100 MG tablet Take 2 tablets (200 mg total) by mouth at bedtime. 11/11/21  Yes Roddenberry, Arlis Porta, PA-C rosuvastatin (CRESTOR) 10 MG tablet Take 1 tablet (10 mg total) by mouth daily. 11/11/21  Yes Roddenberry, Arlis Porta, PA-C simethicone (MYLICON) 80 MG chewable tablet Chew 80 mg by mouth every 6 (six) hours as needed for flatulence.   Yes [provider] TRINTELLIX 20 MG TABS tablet Take 20 mg by mouth daily. 11/24/21  Yes [provider] Recording Conditions This is a routine less than one hour EEG recording.  The EEG was performed utilizing standard international 10-20 system of electrode placement, with additional channels monitored for eye movement.  One channel electrocardiogram was monitored.  Data were obtained, stored, and interpreted according to ACNS guidelines (J Clin Neurophysiol 2006;23(2):85-183) utilizing referential montage recording, with reformatting to longitudinal, transverse bipolar, and referential montages as necessary for interpretation, along with digital/automated EEG analysis.  Patient tolerated entire procedure well.  Photic stimulation and hyperventilation were utilized as activation procedures unless otherwise specified below. EEG Description Background Activity: The posterior dominant rhythm (PDR) was 8-'9Hz'$  and reactive. The predominant background consisted of normal voltage and normal anterior to posterior progression. Paroxysmal Abnormalities: None Drowsiness/Sleep: Normal fragmentation of background with occasional vertex waves appreciated during the recording. Stimulation: Hyperventilation and photic stimulation were not performed. ECG Recording: abundant artifact. Recording Quality: Minor muscle, motion, and eye movement artifacts were occasionally noted. EEG Interpretation Ictal Events: None 2.   Interictal Abnormalities: none 3.   Non-Epileptic Events: none  Clinical Correlation This is a normal EEG. No interictal epileptiform abnormalities were recorded.  The absence of epileptiform abnormalities does not preclude a clinical diagnosis of seizures.   Electronically signed by: Lynnae Sandhoff, MD Page: [phone] 11/26/2021, 9:50 PM   VAS [place] LOWER EXTREMITY VENOUS (DVT)  Result Date: 11/26/2021  Lower Venous DVT Study Patient Name:  KALIANNE FETTING  Date of Exam:   11/26/2021 Medical Rec #: [id number]     Accession #:    [id number] Date of Birth: 01-21-1951     Patient Gender: F Patient Age:   50 years Exam Location:  [place] Procedure:      VAS [place] LOWER EXTREMITY VENOUS (DVT) Referring Phys: Gean Birchwood --------------------------------------------------------------------------------  Indications: Pulmonary embolism.  Limitations: Body habitus and poor ultrasound/tissue interface. Comparison Study: no prior Performing Technologist: Archie Patten RVS  Examination Guidelines: A complete evaluation includes B-mode imaging, spectral Doppler, color Doppler, and power Doppler as needed of all accessible portions of each vessel. Bilateral testing is considered an integral part of a complete examination. Limited examinations for reoccurring indications may be performed as noted. The reflux portion of the exam is performed with the patient in reverse Trendelenburg.  +---------+---------------+---------+-----------+----------+-------------------+  RIGHT     Compressibility Phasicity Spontaneity Properties Thrombus Aging       +---------+---------------+---------+-----------+----------+-------------------+  CFV       Full            Yes       Yes                                         +---------+---------------+---------+-----------+----------+-------------------+  SFJ       Full                                                                  +---------+---------------+---------+-----------+----------+-------------------+  FV Prox   Full                                                                   +---------+---------------+---------+-----------+----------+-------------------+  FV Mid    Full                                                                  +---------+---------------+---------+-----------+----------+-------------------+  FV Distal Full            Yes       Yes                                         +---------+---------------+---------+-----------+----------+-------------------+  PFV       Full                                                                  +---------+---------------+---------+-----------+----------+-------------------+  POP       Full            Yes       Yes                                         +---------+---------------+---------+-----------+----------+-------------------+  PTV       Full                                                                  +---------+---------------+---------+-----------+----------+-------------------+  PERO                                                       Not well visualized  +---------+---------------+---------+-----------+----------+-------------------+   +--------+---------------+---------+-----------+----------+--------------------+  LEFT     Compressibility Phasicity Spontaneity Properties Thrombus Aging        +--------+---------------+---------+-----------+----------+--------------------+  CFV      Full            Yes       Yes                                          +--------+---------------+---------+-----------+----------+--------------------+  SFJ      Full                                                                   +--------+---------------+---------+-----------+----------+--------------------+  FV Prox  Full                                                                   +--------+---------------+---------+-----------+----------+--------------------+  FV Mid   Full                                                                    +--------+---------------+---------+-----------+----------+--------------------+  FV       Full            Yes       Yes                                           Distal                                                                          +--------+---------------+---------+-----------+----------+--------------------+  PFV      Full                                                                   +--------+---------------+---------+-----------+----------+--------------------+  POP      Full                                                                   +--------+---------------+---------+-----------+----------+--------------------+  PTV      Full                                             patent by color                                                                  doppler               +--------+---------------+---------+-----------+----------+--------------------+  PERO  Not well visualized   +--------+---------------+---------+-----------+----------+--------------------+     Summary: RIGHT: - There is no evidence of deep vein thrombosis in the lower extremity. However, portions of this examination were limited- see technologist comments above.  - No cystic structure found in the popliteal fossa.  LEFT: - There is no evidence of deep vein thrombosis in the lower extremity. However, portions of this examination were limited- see technologist comments above.  - No cystic structure found in the popliteal fossa.  *See table(s) above for measurements and observations. Electronically signed by Jamelle Haring on 11/26/2021 at 8:57:21 PM.    Final     Cardiac Studies  CTA 11/26/21: FINDINGS: Body habitus reduces diagnostic sensitivity and specificity. Metal artifact from spinal and shoulder hardware also reduces signal to noise ratio, metal artifact reduction processing was employed.   Cardiovascular: Prominent main pulmonary artery at 4.5 cm  in transverse diameter on image 139 series 8, compatible with pulmonary arterial hypertension.   Right lower lobe posterior basal segmental acute pulmonary embolus observed on image 263, series 8.   Suspected small segmental right upper lobe pulmonary embolus, image 151 series 8 and also separately on image 129 series 8.   No lobar or larger pulmonary embolus identified.   Coronary, aortic arch, and branch vessel atherosclerotic vascular disease. Prior CABG with continued edema along the anterior mediastinal soft tissues and around the sternum with a small pericardial effusion. Moderate cardiomegaly. Substantial mitral valve calcification and mild aortic valve calcifications.   Mediastinum/Nodes: As noted above, there is continued edema in the anterior mediastinum around the sternum likely related to the patient's prior CABG. Edema tracks around the sternotomy site. 0.8 cm AP window lymph node on image 43 series 6, probably reactive.   Lungs/Pleura: Mild mosaic attenuation in the lungs. There is likely some trace mucus in the trachea dependently. Left anterior subpleural reticulation, suggesting prior radiation therapy for left breast cancer. Trace left pleural effusion.   Upper Abdomen: Previous gastric bypass.   Musculoskeletal: Right total shoulder arthroplasty. Lower cervical fusion with fixation hardware. Healing sternotomy.   Subacute fractures of the right anterior second, third, fourth, fifth, sixth, and seventh ribs, with some early healing response. Fractures of the left anterior third, fourth, fifth, sixth, and seventh ribs are more age indeterminate and could be chronic. Likely chronic superior endplate compression at L1.   Review of the MIP images confirms the above findings.   IMPRESSION: 1. Small segmental acute pulmonary emboli in the right upper lobe and right lower lobe pulmonary artery. 2. Prominent main pulmonary artery at 4.5 cm in transverse  diameter indicating pulmonary arterial hypertension. Underlying cardiomegaly and coronary atherosclerosis noted. 3. Recent CABG with healing median sternotomy and continued edema adjacent to the sternotomy site and anterior mediastinum. Small pericardial effusion. 4. Mild mosaic attenuation in the lungs, potentially reflecting a component of mild edema or bronchiolitis obliterans. 5. Subacute fractures of the right anterior second through seventh ribs. Likely older fractures of the left anterior third through seventh ribs. 6. Other imaging findings of potential clinical significance: Trace left pleural effusion. Radiation therapy related findings anteriorly in the left lung. Prior gastric bypass. Mitral valve and aortic valve calcification.   LE Dopplers 11/26/21 Summary:  RIGHT:  - There is no evidence of deep vein thrombosis in the lower extremity.  However, portions of this examination were limited- see technologist  comments above.     - No cystic structure found in the popliteal fossa.     LEFT:  -  There is no evidence of deep vein thrombosis in the lower extremity.  However, portions of this examination were limited- see technologist  comments above.     - No cystic structure found in the popliteal fossa.   Patient Profile     71 y.o. female with PMH STEMI 10/26/21, CHB, now s/p CABG. Presenting with shortness of breath and PE.  Assessment & Plan    Acute subsegmental PE: -management per primary team -post op anemia stable overnight -transition to DOAC, timing per primary team  Concern for pulmonary hypertension: -Exam with clear lungs, significant LE edema with concern for right sided symptoms -CT noted PA enlargement to 4.5 cm -TTE with flattened septum, RVSP 44 mmHg, mod-severe TR, hypokinesis of RV wall, severely enlarged RV size (all preop and before PE) -Has not had right heart cath. May need to consider in the future if edema remains difficult to manage, but  would not do acutely given PE -Suspect that with elevated right sided pressures, low albumin, it will be difficult to manage her edema long term and balance her hypotension -Will need sleep study as outpatient to assess for OSA -If blood pressures remain stable this afternoon, would retrial lasix '40mg'$  IV  Hypotension: -Improved this morning -Can re-challenge with lasix '40mg'$  IV this afternoon if BP remain stable  Recent STEMI, s/p CABG 4v -Need to continue clopidogrel given use of cryovein for conduit -Continue rosuvastatin and metoprolol  Post op paroxysmal atrial fibrillation -In NSR currently -Continue amiodarone  LE Edema -Suspect multifactorial in the setting of right heart failure as well as low albumin  -Can re-challenge with lasix '40mg'$  IV this afternoon if BP remain stable   For questions or updates, please contact [facility] Please consult [email or web address] for contact info under        Signed, Freada Bergeron, MD  11/28/2021, 8:37 AM

## 2021-11-29 LAB — PROTIME-INR
INR: 1 (ref 0.8–1.2)
Prothrombin Time: 13.6 seconds (ref 11.4–15.2)

## 2021-11-29 LAB — HEPARIN LEVEL (UNFRACTIONATED): Heparin Unfractionated: 0.36 IU/mL (ref 0.30–0.70)

## 2021-11-29 LAB — BASIC METABOLIC PANEL
Anion gap: 8 (ref 5–15)
BUN: 21 mg/dL (ref 8–23)
CO2: 26 mmol/L (ref 22–32)
Calcium: 8.3 mg/dL — ABNORMAL LOW (ref 8.9–10.3)
Chloride: 104 mmol/L (ref 98–111)
Creatinine, Ser: 0.65 mg/dL (ref 0.44–1.00)
GFR, Estimated: >60 mL/min (ref >60)
Glucose, Bld: 96 mg/dL (ref 70–99)
Potassium: 4.3 mmol/L (ref 3.5–5.1)
Sodium: 138 mmol/L (ref 135–145)

## 2021-11-29 LAB — CBC
HCT: 28.6 % — ABNORMAL LOW (ref 36.0–46.0)
Hemoglobin: 8.9 g/dL — ABNORMAL LOW (ref 12.0–15.0)
MCH: 30.9 pg (ref 26.0–34.0)
MCHC: 31.1 g/dL (ref 30.0–36.0)
MCV: 99.3 fL (ref 80.0–100.0)
Platelets: 315 10*3/uL (ref 150–400)
RBC: 2.88 MIL/uL — ABNORMAL LOW (ref 3.87–5.11)
RDW: 15.8 % — ABNORMAL HIGH (ref 11.5–15.5)
WBC: 5.1 10*3/uL (ref 4.0–10.5)
nRBC: 0.4 % — ABNORMAL HIGH (ref 0.0–0.2)

## 2021-11-29 LAB — MAGNESIUM: Magnesium: 1.9 mg/dL (ref 1.7–2.4)

## 2021-11-29 MED ORDER — WARFARIN SODIUM 5 MG PO TABS
5.0000 mg | ORAL_TABLET | Freq: Once | ORAL | Status: AC
Start: 2021-11-29 — End: 2021-11-29
  Administered 2021-11-29: 5 mg via ORAL
  Filled 2021-11-29: qty 1

## 2021-11-29 MED ORDER — FUROSEMIDE 10 MG/ML IJ SOLN
20.0000 mg | Freq: Once | INTRAMUSCULAR | Status: AC
  Administered 2021-11-29: 20 mg via INTRAVENOUS
  Filled 2021-11-29: qty 2

## 2021-11-29 MED ORDER — ENOXAPARIN SODIUM 120 MG/0.8ML IJ SOSY
110.0000 mg | PREFILLED_SYRINGE | Freq: Two times a day (BID) | INTRAMUSCULAR | Status: DC
  Administered 2021-11-29 – 2021-12-01 (×6): 110 mg via SUBCUTANEOUS
  Filled 2021-11-29 (×6): qty 0.74

## 2021-11-29 MED ORDER — WARFARIN - PHARMACIST DOSING INPATIENT
Freq: Every day | Status: DC
  Administered 2021-12-01: 5

## 2021-11-29 MED ORDER — METOPROLOL SUCCINATE ER 25 MG PO TB24
12.5000 mg | ORAL_TABLET | Freq: Every day | ORAL | Status: DC
Start: 2021-11-29 — End: 2021-12-02
  Administered 2021-11-29 – 2021-12-01 (×3): 12.5 mg via ORAL
  Filled 2021-11-29 (×3): qty 1

## 2021-11-29 MED ORDER — METOPROLOL SUCCINATE ER 25 MG PO TB24
12.5000 mg | ORAL_TABLET | Freq: Every day | ORAL | Status: DC
Start: 2021-11-30 — End: 2021-11-29

## 2021-11-29 NOTE — Progress Notes (Signed)
ANTICOAGULATION CONSULT NOTE ? ?Pharmacy Consult for Heparin ?Indication: pulmonary embolus ? ?Allergies  ?Allergen Reactions  ? Nsaids Nausea And Vomiting and Other (See Comments)  ?  GI Upset (ibuprofen included)  ? Ibuprofen Other (See Comments)  ?  Other reaction(s): Abdominal Pain  ? Tolmetin Other (See Comments)  ?  Other reaction(s): Abdominal Pain ?  ? ? ?Patient Measurements: ?Height: '4\' 11"'$  (149.9 cm) ?Weight: 112.1 kg (247 lb 2.2 oz) ?IBW/kg (Calculated) : 43.2 ?Heparin Dosing Weight: 72 kg ? ?Vital Signs: ?Temp: 98.1 ?F (36.7 ?C) (03/12 7169) ?Temp Source: Oral (03/12 6789) ?BP: 121/69 (03/12 3810) ?Pulse Rate: 78 (03/12 0605) ? ?Labs: ?Recent Labs  ?  11/26/21 ?1119 11/26/21 ?1310 11/26/21 ?2156 11/27/21 ?0204 11/27/21 ?1157 11/28/21 ?1751 11/29/21 ?0102  ?HGB 9.7*  --   --  8.3*  --  8.6* 8.9*  ?HCT 30.8*  --   --  25.8*  --  27.2* 28.6*  ?PLT 373  --   --  304  --  307 315  ?LABPROT  --  13.7  --   --   --   --   --   ?INR  --  1.1  --   --   --   --   --   ?HEPARINUNFRC  --   --    < > 0.27* 0.33 0.31 0.36  ?CREATININE 1.17*  --   --  1.14*  --  0.94 0.65  ?TROPONINIHS 54* 50*  --   --   --   --   --   ? < > = values in this interval not displayed.  ? ? ? ?Estimated Creatinine Clearance: 70 mL/min (by C-G formula based on SCr of 0.65 mg/dL). ? ? ?Medical History: ?Past Medical History:  ?Diagnosis Date  ? Adverse effect of anesthetic 01/2012  ? difficulty waking up.  ? Adverse effect of unspecified anesthetic, initial encounter 08/2011  ? low o2 sats.  ? Anemia   ? Anxiety   ? Arthritis   ? Breast cancer ([place]) 02/16/2017  ? left breast  ? Chronic pain   ? Closed supracondylar fracture of right femur ([place]) 09/18/2012  ? Depression   ? Family history of breast cancer   ? GERD (gastroesophageal reflux disease)   ? pt denies  ? Headache(784.0)   ? Hypertension   ? Hypothyroidism   ? Opioid dependence in controlled environment [facility])   ? Osteoporosis   ? Osteoporosis with fracture 09/19/2012  ? Pneumonia    ? Thyroid disease   ? ? ? ?Assessment: ?62 yof with a history of three vessel CAD s/p CABG last month, anemia, anxiety, breast cancer, chronic pain, depression, HTN, hypothyroidism, osteoporosis, pna. Patient is presenting with SOB. Heparin per pharmacy consult placed for pulmonary embolus.  EDP consult CT surg who is ok with heparin from their perspective. ? ?CTA PE w/ small segmental acute PE in rt upper and lower lobe. Patient is not on anticoagulation prior to arrival. ? ?Heparin level 0.36 and at goal on 1500 units/h. Hgb remains stable in the 8's, platelets are within normal limits. No signs of bleeding noted.  ? ?Of note, patient needing to transition to PO anticoagulation; however, current carbamazepine therapy has large interaction with apixaban, rivaroxaban, and dabigatran (carbamazepine will make DOACs loose efficacy). Slight interaction exists with edoxaban and warfarin. Will follow today for anticoagulation plan.  ? ?Goal of Therapy:  ?Heparin level 0.3-0.7 units/ml ?Monitor platelets by anticoagulation protocol: Yes ?  ?Plan:  ?  Continue IV heparin gtt to 1500 units/h ?Daily heparin level, CBC ?Monitor for signs and symptoms of bleeding ?F/u plans for oral anticoagulation ? ?Thank you for involving pharmacy in this patient's care. ? ?Elita Quick, PharmD ?PGY1 Ambulatory Care Pharmacy Resident ?11/29/2021 8:48 AM ? ?**Pharmacist phone directory can be found on [email or web address] listed under [place]** ?

## 2021-11-29 NOTE — TOC Initial Note (Addendum)
Transition of Care (TOC) - Initial/Assessment Note  ? ? ?Patient Details  ?Name: Deborah Cooke ?MRN: [id number] ?Date of Birth: 12-Apr-1951 ? ?Transition of Care (TOC) CM/SW Contact:    ?Bary Castilla, LCSW ?Phone [phone] ?11/29/2021, 2:25 PM ? ?Clinical Narrative:                 ? ?Pt's recommendation from PT was CIR however CIR did an assessment and due to not having the support needed at home it was decided that pt pursue SNF placement. ? ?CSW met with pt and she is in agreement with SNF placement. Pt explained that she has been to [facility] and that is where she wants to go to again. Pt stressed that she gives permission to have the referral faxed out to other places however they must  be 4 stars of better. Pt first choice is [facility]. ? ?Pt asked if CSW could follow up with son as well as brother Fritz Pickerel. CSW spoke with son Remo Lipps and updated him about pt's DC plan.CSW aslo spoke with brother Fritz Pickerel and he discussed that he wants to make sure that pt is discharged to proper level of care due to her husband being on the road. CSW explained that pt should be DC to a SNF.Fritz Pickerel thanked CSW for the follow up. ? ?TOC team will continue to assist with discharge planning needs.  ? ?Expected Discharge Plan: [place] ?Barriers to Discharge: Continued Medical Work up, SNF Pending bed offer, Insurance Authorization ? ? ?Patient Goals and CMS Choice ?  ?CMS Medicare.gov Compare Post Acute Care list provided to:: Patient ?Choice offered to / list presented to : Patient ? ?Expected Discharge Plan and Services ?Expected Discharge Plan: [place] ?  ?  ?  ?Living arrangements for the past 2 months: [place] ?                ?  ?  ?  ?  ?  ?  ?  ?  ?  ?  ? ?Prior Living Arrangements/Services ?Living arrangements for the past 2 months: [place] ?Lives with:: Self, Spouse ?  ?       ?Need for Family Participation in Patient Care: Yes (Comment) ?Care giver support system  in place?: Yes (comment) ?  ?  ? ?Activities of Daily Living ?Home Assistive Devices/Equipment: Eyeglasses, Kasandra Knudsen (specify quad or straight), Dentures (specify type), Walker (specify type) ?ADL Screening (condition at time of admission) ?Patient's cognitive ability adequate to safely complete daily activities?: Yes ?Is the patient deaf or have difficulty hearing?: No ?Does the patient have difficulty seeing, even when wearing glasses/contacts?: No ?Does the patient have difficulty concentrating, remembering, or making decisions?: No ?Patient able to express need for assistance with ADLs?: Yes ?Does the patient have difficulty dressing or bathing?: Yes ?Independently performs ADLs?: Yes (appropriate for developmental age) ?Does the patient have difficulty walking or climbing stairs?: Yes ?Weakness of Legs: Both ?Weakness of Arms/Hands: Both ? ?Permission Sought/Granted ?  ?Permission granted to share information with : Yes, Verbal Permission Granted ? Share Information with NAME: [name] or son Remo Lipps ? Permission granted to share info w AGENCY: SNFs with 4 stars or higher ? Permission granted to share info w Relationship: Spouse and son ? Permission granted to share info w Contact Information: Raina Mina- [phone] son Remo Lipps [phone] ? ?Emotional Assessment ?Appearance:: Appears stated age ?Attitude/Demeanor/Rapport: Engaged ?Affect (typically observed): Accepting, Adaptable ?Orientation: :  Oriented to Self, Oriented to Place, Oriented to  Time, Oriented to Situation ?  ?  ? ?Admission diagnosis:  Pulmonary embolism ([place]) [I26.99] ?Acute pulmonary embolism ([place]) [I26.99] ?Patient Active Problem List  ? Diagnosis Date Noted  ? Acute pulmonary embolism ([place]) 11/27/2021  ? Myoclonic jerking 11/27/2021  ? AKI (acute kidney injury) ([place]) 11/27/2021  ? Generalized weakness 11/27/2021  ? Pulmonary embolism ([place]) 11/26/2021  ? 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 Heart block AV complete ([place]) 10/26/2021  ? Acute MI,  inferior wall ([place])   ? S/P reverse total shoulder arthroplasty, right 10/20/2021  ? Rotator cuff arthropathy of right shoulder 09/22/2021  ? Type I or II open comminuted intra-articular fracture of distal end of left femur, initial encounter ([place]) 10/15/2020  ? Closed fracture of left distal femur ([place]) 10/14/2020  ? Genetic testing 04/21/2017  ? Family history of breast cancer   ? Malignant neoplasm of upper-outer quadrant of left breast in female, estrogen receptor positive ([place]) 03/11/2017  ? Hypertension   ? Hypothyroidism   ? Osteoporosis   ? Anxiety   ? Acute blood loss anemia 09/19/2012  ? Chronic pain 09/19/2012  ? Osteoporosis with fracture 09/19/2012  ? Closed supracondylar fracture of right femur ([place]) 09/18/2012  ? ?PCP:  Rich Fuchs, PA ?Pharmacy:   ?[id number] - [place] [place] - [address] ?[address] ?[place] [address] ?Phone: [phone] Fax: [phone] ? ? ? ? ?Social Determinants of Health (SDOH) Interventions ?  ? ?Readmission Risk Interventions ?No flowsheet data found. ? ? ?

## 2021-11-29 NOTE — Progress Notes (Unsigned)
Office Visit    Patient Name: Deborah Cooke Date of Encounter: 11/29/2021  PCP:  Rich Fuchs, [place]  Cardiologist:  Larae Grooms, MD  Advanced Practice Provider:  No care team member to display Electrophysiologist:  None   HPI    Deborah Cooke is a 71 y.o. female with a hx of shoulder surgery in January 2023, STEMI 10/26/2021 and CHB, catheterization with significant CAD and temp pacer placed then CABG, CHF, PE presents today for hospital follow-up appointment.  The patient had a STEMI 10/26/2021 which was thrombus to the RCA with attempts at extraction which was unsuccessful, significant disease including m,ef was normal and balloon placed in Cath Lab along with temp wire for CHB.  It was felt that her coronary anatomy was not amendable to PCI and high risk surgery was the best approach.  T CTS was consulted for CABG.  She underwent CABG x4 (saphenous vein graft to LAD, cryopreserved allograft to posterior descending, sequential cryopreserved allograft to OM 1 and OM 2).  In the OR she did have V-fib and was shocked with internal cardiac massage while her chest was opened.  She did well with surgery and IABP was weaned and removed postop day 4.  She was on inotropic support but weaned over a few days.  She did develop atrial fibrillation postoperatively.  She did develop an ileus as well which resolved prior to discharge.  She was discharged to SNF for rehab.  She will need to remain on Plavix indefinitely for CryoVein used for bypass conduit.  She remained weak and rehab and sustained multiple falls.  Today, she ***     Past Medical History    Past Medical History:  Diagnosis Date   Adverse effect of anesthetic 01/2012   difficulty waking up.   Adverse effect of unspecified anesthetic, initial encounter 08/2011   low o2 sats.   Anemia    Anxiety    Arthritis    Breast cancer ([place]) 02/16/2017   left breast   Chronic pain    Closed  supracondylar fracture of right femur ([place]) 09/18/2012   Depression    Family history of breast cancer    GERD (gastroesophageal reflux disease)    pt denies   Headache(784.0)    Hypertension    Hypothyroidism    Opioid dependence in controlled environment ([place])    Osteoporosis    Osteoporosis with fracture 09/19/2012   Pneumonia    Thyroid disease    Past Surgical History:  Procedure Laterality Date    OPEN REDUCTION INTERNAL FIXATION (ORIF) DISTAL FEMUR FRACTURE (Left Leg Upper)  10/15/2020   BACK SURGERY     fusion mid back, lumbar and thoracic   BREAST BIOPSY Left 02/16/2017   BREAST LUMPECTOMY WITH RADIOACTIVE SEED AND SENTINEL LYMPH NODE BIOPSY Left 03/15/2017   Procedure: LEFT BREAST LUMPECTOMY WITH RADIOACTIVE SEED AND LEFT AXILLARY SENTINEL LYMPH NODE BIOPSY;  Surgeon: [name] Overall, MD;  Location: [facility];  Service: General;  Laterality: Left;   CHOLECYSTECTOMY  10/1975   CORONARY ARTERY BYPASS GRAFT N/A 10/27/2021   Procedure: CORONARY ARTERY BYPASS GRAFTING (CABG) TIMES FOUR, USING LEFT INTERNAL MAMMARY ARTERY AND RIGHT LEG GREATER SAPHENOUS VEIN HARVESTED ENDOSCOPICALLY AND RIGHT CORONARY THROMBECTOMY;  Surgeon: Dahlia Byes, MD;  Location: [facility];  Service: Open Heart Surgery;  Laterality: N/A;   CORONARY/GRAFT ACUTE MI REVASCULARIZATION N/A 10/26/2021   Procedure: Coronary/Graft Acute MI Revascularization;  Surgeon: Larae Grooms  S, MD;  Location: [facility];  Service: Cardiovascular;  Laterality: N/A;   ENDOVEIN HARVEST OF GREATER SAPHENOUS VEIN Right 10/27/2021   Procedure: ENDOVEIN HARVEST OF GREATER SAPHENOUS VEIN;  Surgeon: Dahlia Byes, MD;  Location: [facility];  Service: Open Heart Surgery;  Laterality: Right;   FEMUR IM NAIL  09/18/2012   Procedure: INTRAMEDULLARY (IM) RETROGRADE FEMORAL NAILING;  Surgeon: [name], MD;  Location: [place] [facility];  Service: Orthopedics;  Laterality: Right;   GASTRIC BYPASS  2003   IABP INSERTION N/A  10/26/2021   Procedure: IABP Insertion;  Surgeon: Jettie Booze, MD;  Location: [facility];  Service: Cardiovascular;  Laterality: N/A;   JOINT REPLACEMENT     LEFT HEART CATH AND CORONARY ANGIOGRAPHY N/A 10/26/2021   Procedure: LEFT HEART CATH AND CORONARY ANGIOGRAPHY;  Surgeon: Jettie Booze, MD;  Location: [facility];  Service: Cardiovascular;  Laterality: N/A;   NECK SURGERY  2000   ORIF FEMUR FRACTURE Left 10/15/2020   Procedure: OPEN REDUCTION INTERNAL FIXATION (ORIF) DISTAL FEMUR FRACTURE;  Surgeon: Shona Needles, MD;  Location: [facility];  Service: Orthopedics;  Laterality: Left;   REPLACEMENT TOTAL KNEE  2003   right and left -4 months aoart.   REVERSE SHOULDER ARTHROPLASTY Right 10/20/2021   Procedure: REVERSE SHOULDER ARTHROPLASTY;  Surgeon: Marchia Bond, MD;  Location: [facility];  Service: Orthopedics;  Laterality: Right;   TEE WITHOUT CARDIOVERSION N/A 10/27/2021   Procedure: TRANSESOPHAGEAL ECHOCARDIOGRAM (TEE);  Surgeon: Dahlia Byes, MD;  Location: [facility];  Service: Open Heart Surgery;  Laterality: N/A;   TEMPORARY PACEMAKER N/A 10/26/2021   Procedure: TEMPORARY PACEMAKER;  Surgeon: Jettie Booze, MD;  Location: [facility];  Service: Cardiovascular;  Laterality: N/A;   TONSILLECTOMY     WRIST FRACTURE SURGERY Bilateral 08/2011    Allergies  Allergies  Allergen Reactions   Nsaids Nausea And Vomiting and Other (See Comments)    GI Upset (ibuprofen included)   Ibuprofen Other (See Comments)    Other reaction(s): Abdominal Pain   Tolmetin Other (See Comments)    Other reaction(s): Abdominal Pain      EKGs/Labs/Other Studies Reviewed:   The following studies were reviewed today:   ECHO 10/27/21  IMPRESSIONS     1. Left ventricular ejection fraction, by estimation, is 60 to 65%. The  left ventricle has normal function. The left ventricle has no regional  wall motion abnormalities. Left ventricular diastolic parameters are   consistent with Grade II diastolic  dysfunction (pseudonormalization). There is the interventricular septum is  flattened in diastole ('D' shaped left ventricle), consistent with right  ventricular volume overload.   2. There is preserved contractility of the right ventricular apex with  moderate hypokinesis of the right ventricular wall, consistent with acute  cor pulmonale (McConnell's sign), versus right ventricular free wall  infarction. Right ventricular systolic  function is moderately reduced. The right ventricular size is severely  enlarged. There is mildly elevated pulmonary artery systolic pressure.   3. Left atrial size was moderately dilated.   4. Right atrial size was moderately dilated.   5. The mitral valve is degenerative. No evidence of mitral valve  regurgitation. The mean mitral valve gradient is 3.0 mmHg. Severe mitral  annular calcification.   6. Tricuspid valve regurgitation is moderate to severe.   7. The aortic valve was not well visualized. Aortic valve regurgitation  is not visualized. Aortic valve sclerosis/calcification is present,  without any evidence of aortic  stenosis.   8. There is mild dilatation of the ascending aorta, measuring 39 mm.   9. The inferior vena cava is normal in size with <50% respiratory  variability, suggesting right atrial pressure of 8 mmHg.    EKG:  EKG is *** ordered today.  The ekg ordered today demonstrates ***  Recent Labs: 11/26/2021: B Natriuretic Peptide 482.4; TSH 5.546 11/27/2021: ALT 11 11/29/2021: BUN 21; Creatinine, Ser 0.65; Hemoglobin 8.9; Magnesium 1.9; Platelets 315; Potassium 4.3; Sodium 138  Recent Lipid Panel    Component Value Date/Time   CHOL 154 10/26/2021 2240   TRIG 67 11/01/2021 0357   HDL 55 10/26/2021 2240   CHOLHDL 2.8 10/26/2021 2240   VLDL 29 10/26/2021 2240   LDLCALC 70 10/26/2021 2240    Risk Assessment/Calculations:  {Does this patient have ATRIAL FIBRILLATION?:[phone]}  Home Medications    No outpatient medications have been marked as taking for the 11/30/21 encounter (Appointment) with Elgie Collard, PA-C.     Review of Systems   ***   All other systems reviewed and are otherwise negative except as noted above.  Physical Exam    VS:  There were no vitals taken for this visit. , BMI There is no height or weight on file to calculate BMI.  Wt Readings from Last 3 Encounters:  11/29/21 247 lb 2.2 oz (112.1 kg)  11/11/21 249 lb (112.9 kg)  10/20/21 240 lb 4.8 oz (109 kg)     GEN: Well nourished, well developed, in no acute distress. HEENT: normal. Neck: Supple, no JVD, carotid bruits, or masses. Cardiac: ***RRR, no murmurs, rubs, or gallops. No clubbing, cyanosis, edema.  ***Radials/PT 2+ and equal bilaterally.  Respiratory:  ***Respirations regular and unlabored, clear to auscultation bilaterally. GI: Soft, nontender, nondistended. MS: No deformity or atrophy. Skin: Warm and dry, no rash. Neuro:  Strength and sensation are intact. Psych: Normal affect.  Assessment & Plan    History of PE  Recent CABG x4 after STEMI  PAF postop  Hyperlipidemia  Postop anemia  History of multiple falls, SNF for rehab      Disposition: Follow up {follow up:15908} with Larae Grooms, MD or APP.  Signed, Elgie Collard, PA-C 11/29/2021, 7:23 PM [facility]

## 2021-11-29 NOTE — Progress Notes (Signed)
ANTICOAGULATION CONSULT NOTE - Initial Consult ? ?Pharmacy Consult for Warfarin ?Indication: pulmonary embolus ? ?Allergies  ?Allergen Reactions  ? Nsaids Nausea And Vomiting and Other (See Comments)  ?  GI Upset (ibuprofen included)  ? Ibuprofen Other (See Comments)  ?  Other reaction(s): Abdominal Pain  ? Tolmetin Other (See Comments)  ?  Other reaction(s): Abdominal Pain ?  ? ? ?Patient Measurements: ?Height: '4\' 11"'$  (149.9 cm) ?Weight: 112.1 kg (247 lb 2.2 oz) ?IBW/kg (Calculated) : 43.2 ? ? ?Vital Signs: ?Temp: 98.1 ?F (36.7 ?C) (03/12 9702) ?Temp Source: Oral (03/12 6378) ?BP: 121/69 (03/12 5885) ?Pulse Rate: 78 (03/12 0605) ? ?Labs: ?Recent Labs  ?  11/26/21 ?1119 11/26/21 ?1310 11/26/21 ?2156 11/27/21 ?0204 11/27/21 ?1157 11/28/21 ?0277 11/29/21 ?0102  ?HGB 9.7*  --   --  8.3*  --  8.6* 8.9*  ?HCT 30.8*  --   --  25.8*  --  27.2* 28.6*  ?PLT 373  --   --  304  --  307 315  ?LABPROT  --  13.7  --   --   --   --   --   ?INR  --  1.1  --   --   --   --   --   ?HEPARINUNFRC  --   --    < > 0.27* 0.33 0.31 0.36  ?CREATININE 1.17*  --   --  1.14*  --  0.94 0.65  ?TROPONINIHS 54* 50*  --   --   --   --   --   ? < > = values in this interval not displayed.  ? ? ?Estimated Creatinine Clearance: 70 mL/min (by C-G formula based on SCr of 0.65 mg/dL). ? ? ?Medical History: ?Past Medical History:  ?Diagnosis Date  ? Adverse effect of anesthetic 01/2012  ? difficulty waking up.  ? Adverse effect of unspecified anesthetic, initial encounter 08/2011  ? low o2 sats.  ? Anemia   ? Anxiety   ? Arthritis   ? Breast cancer ([place]) 02/16/2017  ? left breast  ? Chronic pain   ? Closed supracondylar fracture of right femur ([place]) 09/18/2012  ? Depression   ? Family history of breast cancer   ? GERD (gastroesophageal reflux disease)   ? pt denies  ? Headache(784.0)   ? Hypertension   ? Hypothyroidism   ? Opioid dependence in controlled environment [facility])   ? Osteoporosis   ? Osteoporosis with fracture 09/19/2012  ? Pneumonia   ? Thyroid  disease   ? ? ?Medications:  ?Scheduled:  ? amiodarone  200 mg Oral Daily  ? anastrozole  1 mg Oral Daily  ? buPROPion  300 mg Oral Daily  ? carbamazepine  400 mg Oral BID  ? cholecalciferol  400 Units Oral Daily  ? clopidogrel  75 mg Oral Daily  ? DULoxetine  30 mg Oral BID  ? enoxaparin (LOVENOX) injection  110 mg Subcutaneous Q12H  ? ferrous sulfate  325 mg Oral Q breakfast  ? furosemide  20 mg Intravenous Once  ? gabapentin  200 mg Oral TID  ? latanoprost  1 drop Both Eyes QHS  ? levothyroxine  88 mcg Oral Daily  ? liothyronine  25 mcg Oral Daily  ? [START ON 11/30/2021] metoprolol succinate  12.5 mg Oral Daily  ? potassium chloride SA  20 mEq Oral Daily  ? QUEtiapine  200 mg Oral QHS  ? rosuvastatin  10 mg Oral Daily  ? vortioxetine HBr  20  mg Oral Daily  ? warfarin  5 mg Oral ONCE-1600  ? Warfarin - Pharmacist Dosing Inpatient   Does not apply [id number]  ? ? 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 Patient is presenting with SOB. Heparin per pharmacy consult placed for pulmonary embolus now to transition to warfarin. Pharmacy to dose warfarin.  ? ?Patient has no prior history of utilizing warfarin. Currently on carbamazepine 400 mg BID which interacts with warfarin and can decrease warfarin's effect - will monitor with daily INR until discharged. Also taking amiodarone which can increase the effects of warfarin. Will monitor as well. Given new start, will need to bridge with enoxaparin 110 mg (1 mg/kg) BID for 5 days or until INR therapeutic.  ? ? ?Goal of Therapy:  ?INR 2-3 ?Monitor platelets by anticoagulation protocol: Yes ?  ?Plan:  ?Discontinue IV heparin gtt ?Start enoxaparin 110 mg SQ BID ?Warfarin 5 mg x 1 today ?Baseline INR today ?Daily INR, CBC ?Monitor for signs and symptoms of bleeding ? ?Thank you for involving pharmacy in this patient's care. ? ?Elita Quick,  PharmD ?PGY1 Ambulatory Care Pharmacy Resident ?11/29/2021 11:33 AM ? ?**Pharmacist phone directory can be found on [email or web address] listed under [place]** ?

## 2021-11-29 NOTE — Progress Notes (Signed)
?PROGRESS NOTE ? ? ? ?Deborah Cooke  DGL:[id number] DOB: 12-01-1950 DOA: 11/26/2021 ?PCP: Deborah Fuchs, PA  ? ? ?Brief Narrative:  ? Deborah Cooke is a 71 y.o. female with past medical history of myocardial infarction status post CABG in October 27, 2021 who was discharged to rehab and subsequently went home 4 days back presented to hospital with decreased ambulation, increasing shortness of breath and generalized weakness with lower extremity edema.  Patient's son also noted that that she was having some jerks of her extremities.  Patient was then brought into the hospital.  In the ED, CT angiogram of the chest showed small segmental right upper lobe and lower lobe pulmonary embolism.  Dopplers were negative for DVT.  Cardiology was consulted.  Patient was admitted to the hospital for  further management of acute pulm embolism and evaluation of generalized weakness and shortness of breath..  Patient was seen by cardiology during hospitalization and was started on heparin drip.  ? ?  ?Assessment and Plan: ?* Acute pulmonary embolism ([place]) ?Likely secondary to sedentary status at home.  Lower extremity Dopplers were negative for DVT.  On IV heparin at this time.  Patient has been transitioned to Eliquis but there is significant interaction with carbamazepine.  No other appropriate choice with DOAC's.  Patient would like to be on carbamazepine which helps her with her headache and currently would need taper slowly as outpatient.  I spoke with the patient regarding Coumadin for anticoagulation for the next 3 to 6 months.  She has understood about it.  Consult pharmacy to dose. ? ? ?Myoclonic jerking ?Improved at this time.  Seen by neurology.  EEG unremarkable for seizures.  Patient is on Tegretol as outpatient.  CT head was unremarkable.  ABG unremarkable. ? 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  Patient does have peripheral edema.  Has not had a heart cath in the past.   Might need sleep study as outpatient for obstructive sleep apnea.  Possibly consider trial of Lasix today.  Cardiology on board.  Continue beta-blockers, Plavix.  No acute issues ? ?Hypertension ?Improvement of blood pressure at this time.  Was transiently hypotensive and required IV fluids.  On long-acting metoprolol at this time.  We will try Lasix ? ?Malignant neoplasm of upper-outer quadrant of left breast in female, estrogen receptor positive ([place]) ?In remission.  On anastrozole. ? ?Hypothyroidism ?Continue Synthroid. ? ?S/P reverse total shoulder arthroplasty, right ?Continue supportive care.  Off sling recently. ? ?Chronic pain ?On Flexeril, gabapentin, PRN Percocet. ? ?AKI (acute kidney injury) ([place]) ?Mild.  Improved at this time.  Creatinine of 0.6 at this time ? ?Generalized weakness ?Debility, deconditioning/ambulatory dysfunction at home though she was recently admitted and discharged from a rehabilitation facility.  Family unable to take care of her at home.  Patient was seen by physical therapy and therapy on 11/28/2021 with recommendations for acute inpatient rehab. ? ? ? DVT prophylaxis:   Heparin drip, will transition to Eliquis. ?warfarin (COUMADIN) tablet 5 mg  ?Code Status:   ?  Code Status: Full Code ? ?Disposition: Patient has been seen by physical therapy who recommended CIR. ? ?Status is: Inpatient ? ?Remains inpatient appropriate because: Pulmonary embolism on anticoagulation, extreme debility weakness likely needing rehabilitation, on supplemental oxygen, need for Coumadin and monitoring. ? ? Family Communication:  ?Spoke with the patient's son at bedside. ? ?Consultants:  ?Cardiology ?Neurology ? ?Procedures:  ?None ? ?Antimicrobials:  ?None ? ?  Anti-infectives (From admission, onward)  ? ? None  ? ?  ? ?Subjective: ?Today, patient was seen and examined at bedside.  Patient stated that she slept well , shortness of breath is better.  Has some dyspnea on exertion.  Denies chest pain but mild  back pain.  Has generalized weakness. ?Objective: ?Vitals:  ? 11/28/21 0834 11/28/21 1342 11/28/21 2022 11/29/21 0605  ?BP: 115/64 110/73 (!) 113/49 121/69  ?Pulse: 88 84 77 78  ?Resp:  '17 19 16  '$ ?Temp:  98.1 ?F (36.7 ?C) 98.1 ?F (36.7 ?C) 98.1 ?F (36.7 ?C)  ?TempSrc:  Oral Oral Oral  ?SpO2:  100% 100%   ?Weight:    112.1 kg  ?Height:      ? ? ?Intake/Output Summary (Last 24 hours) at 11/29/2021 1144 ?Last data filed at 11/29/2021 5573 ?Gross per 24 hour  ?Intake 588.37 ml  ?Output 200 ml  ?Net 388.37 ml  ? ?Filed Weights  ? 11/27/21 0359 11/28/21 0535 11/29/21 0605  ?Weight: 111.1 kg 111.7 kg 112.1 kg  ? ?Body mass index is 49.92 kg/m?.  ? ?Physical Examination: ? ?General: Obese built, not in obvious distress, on nasal cannula oxygen ?HENT:   No scleral pallor or icterus noted. Oral mucosa is moist.  ?Chest:  Clear breath sounds.  Diminished breath sounds bilaterally. No crackles or wheezes.  Scar noted. ?CVS: S1 &S2 heard. No murmur.  Regular rate and rhythm. ?Abdomen: Soft, nontender, nondistended.  Bowel sounds are heard.   ?Extremities: No cyanosis, clubbing with bilateral lower extremity edema.  Peripheral pulses are palpable.  Right shoulder scar noted. ?Psych: Alert, awake and oriented, normal mood ?CNS:  No cranial nerve deficits.  Weakness of the extremities noted. ?Skin: Warm and dry.  No rashes noted. ? ? ?Data Reviewed:  ? ?CBC: ?Recent Labs  ?Lab 11/26/21 ?1119 11/27/21 ?0204 11/28/21 ?0213 11/29/21 ?0102  ?WBC 7.3 6.4 6.0 5.1  ?NEUTROABS 5.2  --   --   --   ?HGB 9.7* 8.3* 8.6* 8.9*  ?HCT 30.8* 25.8* 27.2* 28.6*  ?MCV 99.0 97.4 97.1 99.3  ?PLT 373 304 307 315  ? ? ?Basic Metabolic Panel: ?Recent Labs  ?Lab 11/26/21 ?1119 11/27/21 ?0204 11/28/21 ?0213 11/29/21 ?0102  ?NA 136 137 137 138  ?K 3.8 3.9 4.4 4.3  ?CL 96* 101 100 104  ?CO2 '27 28 29 26  '$ ?GLUCOSE 92 94 103* 96  ?BUN 27* 24* 22 21  ?CREATININE 1.17* 1.14* 0.94 0.65  ?CALCIUM 8.8* 8.1* 8.3* 8.3*  ?MG 2.1  --  1.9 1.9  ? ? ?Liver Function  Tests: ?Recent Labs  ?Lab 11/26/21 ?1815 11/27/21 ?0204  ?AST 17 14*  ?ALT 14 11  ?ALKPHOS 139* 109  ?BILITOT 0.5 0.4  ?PROT 6.1* 5.0*  ?ALBUMIN 2.8* 2.4*  ? ? ? ?Radiology Studies: ?No results found. ? ? ? LOS: 2 days  ? ? ?Flora Lipps, MD ?[facility] ?11/29/2021, 11:44 AM  ?  ?

## 2021-11-29 NOTE — Discharge Instructions (Signed)
Information on my medicine - Coumadin?   (Warfarin) ? ?This medication education was reviewed with me or my healthcare representative as part of my discharge preparation.  The pharmacist that spoke with me during my hospital stay was:   ? ?Why was Coumadin prescribed for you? ?Coumadin was prescribed for you because you have a blood clot or a medical condition that can cause an increased risk of forming blood clots. Blood clots can cause serious health problems by blocking the flow of blood to the heart, lung, or brain. Coumadin can prevent harmful blood clots from forming. ?As a reminder your indication for Coumadin is:  Pulmonary Embolism treatment ? ?What test will check on my response to Coumadin? ?While on Coumadin (warfarin) you will need to have an INR test regularly to ensure that your dose is keeping you in the desired range. The INR (international normalized ratio) number is calculated from the result of the laboratory test called prothrombin time (PT). ? ?If an INR APPOINTMENT HAS NOT ALREADY BEEN MADE FOR YOU please schedule an appointment to have this lab work done by your health care provider within 7 days. ?Your INR goal is usually a number between:  2 to 3 or your provider may give you a more narrow range like 2-2.5.  Ask your health care provider during an office visit what your goal INR is. ? ?What  do you need to  know  About  COUMADIN? ?Take Coumadin (warfarin) exactly as prescribed by your healthcare provider about the same time each day.  DO NOT stop taking without talking to the doctor who prescribed the medication.  Stopping without other blood clot prevention medication to take the place of Coumadin may increase your risk of developing a new clot or stroke.  Get refills before you run out. ? ?What do you do if you miss a dose? ?If you miss a dose, take it as soon as you remember on the same day then continue your regularly scheduled regimen the next day.  Do not take two doses of Coumadin at  the same time. ? ?Important Safety Information ?A possible side effect of Coumadin (Warfarin) is an increased risk of bleeding. You should call your healthcare provider right away if you experience any of the following: ?Bleeding from an injury or your nose that does not stop. ?Unusual colored urine (red or dark brown) or unusual colored stools (red or black). ?Unusual bruising for unknown reasons. ?A serious fall or if you hit your head (even if there is no bleeding). ? ?Some foods or medicines interact with Coumadin? (warfarin) and might alter your response to warfarin. To help avoid this: ?Eat a balanced diet, maintaining a consistent amount of Vitamin K. ?Notify your provider about major diet changes you plan to make. ?Avoid alcohol or limit your intake to 1 drink for women and 2 drinks for men per day. ?(1 drink is 5 oz. wine, 12 oz. beer, or 1.5 oz. liquor.) ? ?Make sure that ANY health care provider who prescribes medication for you knows that you are taking Coumadin (warfarin).  Also make sure the healthcare provider who is monitoring your Coumadin knows when you have started a new medication including herbals and non-prescription products. ? ?Coumadin? (Warfarin)  Major Drug Interactions  ?Increased Warfarin Effect Decreased Warfarin Effect  ?Alcohol (large quantities) ?Antibiotics (esp. Septra/Bactrim, Flagyl, Cipro) ?Amiodarone (Cordarone) ?Aspirin (ASA) ?Cimetidine (Tagamet) ?Megestrol (Megace) ?NSAIDs (ibuprofen, naproxen, etc.) ?Piroxicam (Feldene) ?Propafenone (Rythmol SR) ?Propranolol (Inderal) ?Isoniazid (INH) ?Posaconazole (Noxafil) Barbiturates (  Phenobarbital) ?Carbamazepine (Tegretol) ?Chlordiazepoxide (Librium) ?Cholestyramine (Questran) ?Griseofulvin ?Oral Contraceptives ?Rifampin ?Sucralfate (Carafate) ?Vitamin K  ? ?Coumadin? (Warfarin) Major Herbal Interactions  ?Increased Warfarin Effect Decreased Warfarin Effect  ?Garlic ?Ginseng ?Ginkgo biloba Coenzyme Q10 ?Green tea ?St. John?s wort    ? ?Coumadin? (Warfarin) FOOD Interactions  ?Eat a consistent number of servings per week of foods HIGH in Vitamin K ?(1 serving = ? cup)  ?Collards (cooked, or boiled & drained) ?Kale (cooked, or boiled & drained) ?Mustard greens (cooked, or boiled & drained) ?Parsley *serving size only = ? cup ?Spinach (cooked, or boiled & drained) ?Swiss chard (cooked, or boiled & drained) ?Turnip greens (cooked, or boiled & drained)  ?Eat a consistent number of servings per week of foods MEDIUM-HIGH in Vitamin K ?(1 serving = 1 cup)  ?Asparagus (cooked, or boiled & drained) ?Broccoli (cooked, boiled & drained, or raw & chopped) ?Brussel sprouts (cooked, or boiled & drained) *serving size only = ? cup ?Lettuce, raw (green leaf, endive, romaine) ?Spinach, raw ?Turnip greens, raw & chopped  ? ?These websites have more information on Coumadin (warfarin):  FailFactory.se; ?www.https://www.atkinson.net/; ? ? ?

## 2021-11-29 NOTE — Progress Notes (Signed)
Progress Note  Patient Name: Deborah Cooke Date of Encounter: 11/29/2021  [facility] Cardiologist: Larae Grooms, MD   Subjective   Continues to have dyspnea on exertion and difficulty "taking a deep breath"  Blood pressures stable 90-120s Cr stable 0.94>0.65  Inpatient Medications    Scheduled Meds:  amiodarone  200 mg Oral Daily   anastrozole  1 mg Oral Daily   buPROPion  300 mg Oral Daily   carbamazepine  400 mg Oral BID   cholecalciferol  400 Units Oral Daily   clopidogrel  75 mg Oral Daily   DULoxetine  30 mg Oral BID   ferrous sulfate  325 mg Oral Q breakfast   gabapentin  200 mg Oral TID   latanoprost  1 drop Both Eyes QHS   levothyroxine  88 mcg Oral Daily   liothyronine  25 mcg Oral Daily   metoprolol succinate  25 mg Oral Daily   potassium chloride SA  20 mEq Oral Daily   QUEtiapine  200 mg Oral QHS   rosuvastatin  10 mg Oral Daily   vortioxetine HBr  20 mg Oral Daily   Continuous Infusions:  heparin 1,500 Units/hr (11/28/21 1708)   PRN Meds: cyclobenzaprine, oxyCODONE-acetaminophen, simethicone   Vital Signs    Vitals:   11/28/21 0834 11/28/21 1342 11/28/21 2022 11/29/21 0605  BP: 115/64 110/73 (!) 113/49 121/69  Pulse: 88 84 77 78  Resp:  '17 19 16  '$ Temp:  98.1 F (36.7 C) 98.1 F (36.7 C) 98.1 F (36.7 C)  TempSrc:  Oral Oral Oral  SpO2:  100% 100%   Weight:    112.1 kg  Height:        Intake/Output Summary (Last 24 hours) at 11/29/2021 1914 Last data filed at 11/29/2021 0528 Gross per 24 hour  Intake 588.37 ml  Output 200 ml  Net 388.37 ml    Last 3 Weights 11/29/2021 11/28/2021 11/27/2021  Weight (lbs) 247 lb 2.2 oz 246 lb 4.1 oz 244 lb 14.9 oz  Weight (kg) 112.1 kg 111.7 kg 111.1 kg      Telemetry    NSR, occasional PVCs- Personally Reviewed  ECG    No new tracing  - Personally Reviewed  Physical Exam   GEN: Sitting in a chair, NAD   Neck: No JVD appreciated but difficult body habitus Cardiac: RRR, no murmurs,  rubs, or gallops.  Respiratory: Clear to auscultation bilaterally. GI: Soft, nontender, non-distended  MS: 1-2+ pitting edema to the thighs, warm Neuro:  Nonfocal  Psych: Normal affect   Labs    High Sensitivity Troponin:   Recent Labs  Lab 11/26/21 1119 11/26/21 1310  TROPONINIHS 54* 50*      Chemistry Recent Labs  Lab 11/26/21 1119 11/26/21 1815 11/27/21 0204 11/28/21 0213 11/29/21 0102  NA 136  --  137 137 138  K 3.8  --  3.9 4.4 4.3  CL 96*  --  101 100 104  CO2 27  --  '28 29 26  '$ GLUCOSE 92  --  94 103* 96  BUN 27*  --  24* 22 21  CREATININE 1.17*  --  1.14* 0.94 0.65  CALCIUM 8.8*  --  8.1* 8.3* 8.3*  MG 2.1  --   --  1.9 1.9  PROT  --  6.1* 5.0*  --   --   ALBUMIN  --  2.8* 2.4*  --   --   AST  --  17 14*  --   --  ALT  --  14 11  --   --   ALKPHOS  --  139* 109  --   --   BILITOT  --  0.5 0.4  --   --   GFRNONAA 49*  --  51* >60 >60  ANIONGAP 13  --  '8 8 8     '$ Lipids No results for input(s): CHOL, TRIG, HDL, LABVLDL, LDLCALC, CHOLHDL in the last 168 hours.  Hematology Recent Labs  Lab 11/27/21 0204 11/28/21 0213 11/29/21 0102  WBC 6.4 6.0 5.1  RBC 2.65* 2.80* 2.88*  HGB 8.3* 8.6* 8.9*  HCT 25.8* 27.2* 28.6*  MCV 97.4 97.1 99.3  MCH 31.3 30.7 30.9  MCHC 32.2 31.6 31.1  RDW 16.0* 15.9* 15.8*  PLT 304 307 315    Thyroid  Recent Labs  Lab 11/26/21 1815  TSH 5.546*     BNP Recent Labs  Lab 11/26/21 1119  BNP 482.4*     DDimer No results for input(s): DDIMER in the last 168 hours.   Radiology    No results found.  Cardiac Studies  CTA 11/26/21: FINDINGS: Body habitus reduces diagnostic sensitivity and specificity. Metal artifact from spinal and shoulder hardware also reduces signal to noise ratio, metal artifact reduction processing was employed.   Cardiovascular: Prominent main pulmonary artery at 4.5 cm in transverse diameter on image 139 series 8, compatible with pulmonary arterial hypertension.   Right lower lobe  posterior basal segmental acute pulmonary embolus observed on image 263, series 8.   Suspected small segmental right upper lobe pulmonary embolus, image 151 series 8 and also separately on image 129 series 8.   No lobar or larger pulmonary embolus identified.   Coronary, aortic arch, and branch vessel atherosclerotic vascular disease. Prior CABG with continued edema along the anterior mediastinal soft tissues and around the sternum with a small pericardial effusion. Moderate cardiomegaly. Substantial mitral valve calcification and mild aortic valve calcifications.   Mediastinum/Nodes: As noted above, there is continued edema in the anterior mediastinum around the sternum likely related to the patient's prior CABG. Edema tracks around the sternotomy site. 0.8 cm AP window lymph node on image 43 series 6, probably reactive.   Lungs/Pleura: Mild mosaic attenuation in the lungs. There is likely some trace mucus in the trachea dependently. Left anterior subpleural reticulation, suggesting prior radiation therapy for left breast cancer. Trace left pleural effusion.   Upper Abdomen: Previous gastric bypass.   Musculoskeletal: Right total shoulder arthroplasty. Lower cervical fusion with fixation hardware. Healing sternotomy.   Subacute fractures of the right anterior second, third, fourth, fifth, sixth, and seventh ribs, with some early healing response. Fractures of the left anterior third, fourth, fifth, sixth, and seventh ribs are more age indeterminate and could be chronic. Likely chronic superior endplate compression at L1.   Review of the MIP images confirms the above findings.   IMPRESSION: 1. Small segmental acute pulmonary emboli in the right upper lobe and right lower lobe pulmonary artery. 2. Prominent main pulmonary artery at 4.5 cm in transverse diameter indicating pulmonary arterial hypertension. Underlying cardiomegaly and coronary atherosclerosis noted. 3. Recent  CABG with healing median sternotomy and continued edema adjacent to the sternotomy site and anterior mediastinum. Small pericardial effusion. 4. Mild mosaic attenuation in the lungs, potentially reflecting a component of mild edema or bronchiolitis obliterans. 5. Subacute fractures of the right anterior second through seventh ribs. Likely older fractures of the left anterior third through seventh ribs. 6. Other imaging findings of potential clinical  significance: Trace left pleural effusion. Radiation therapy related findings anteriorly in the left lung. Prior gastric bypass. Mitral valve and aortic valve calcification.   LE Dopplers 11/26/21 Summary:  RIGHT:  - There is no evidence of deep vein thrombosis in the lower extremity.  However, portions of this examination were limited- see technologist  comments above.     - No cystic structure found in the popliteal fossa.     LEFT:  - There is no evidence of deep vein thrombosis in the lower extremity.  However, portions of this examination were limited- see technologist  comments above.     - No cystic structure found in the popliteal fossa.   Patient Profile     71 y.o. female with PMH STEMI 10/26/21, [facility], now s/p CABG. Presenting with shortness of breath and PE.  Assessment & Plan    Acute subsegmental PE: -Management per primary team -Hemoglonin stable -Transition to DOAC vs warfarin (medication interaction between DOAC and HA med; patient prefers to stay on current HA med)  Concern for pulmonary hypertension: -Exam with clear lungs, significant LE edema with concern for right sided symptoms -CT noted PA enlargement to 4.5 cm -TTE with flattened septum, RVSP 44 mmHg, mod-severe TR, hypokinesis of RV wall, severely enlarged RV size (all preop and before PE) -Has not had right heart cath. May need to consider in the future if edema remains difficult to manage, but would not do acutely given PE -Suspect that with elevated  right sided pressures, low albumin, it will be difficult to manage her edema long term and balance her hypotension -Will need sleep study as outpatient to assess for OSA -Plan to challenge with lasix '20mg'$  IV this afternoon per primary  Hypotension: -Improved but still soft in 90s currently -Will cut metop to 12.'5mg'$  XL daily to allow room for diuresis  Recent STEMI, s/p CABG 4v -Need to continue clopidogrel given use of cryovein for conduit -Continue rosuvastatin and metoprolol  Post op paroxysmal atrial fibrillation -In NSR currently -Continue amiodarone  LE Edema -Suspect multifactorial in the setting of right heart failure as well as low albumin  -Plan to re-challenge with lasix '20mg'$  IV this afternoon per primary   For questions or updates, please contact [facility] Please consult [email or web address] for contact info under        Signed, Freada Bergeron, MD  11/29/2021, 6:23 AM

## 2021-11-29 NOTE — [place] FL2 (Signed)
?[place] MEDICAID FL2 LEVEL OF CARE SCREENING TOOL  ?  ? ?IDENTIFICATION  ?Patient Name: ?Deborah Cooke Birthdate: 1951/02/13 Sex: female Admission Date (Current Location): ?11/26/2021  ?[place] and [place] Number: ? [place] ?  Facility and Address:  ?[facility], [place] [address] [place], [place] [address] ?     Provider Number: ?[id number]  ?Attending Physician Name and Address:  ?Flora Lipps, MD ? Relative Name and Phone Number:  ?[name] [phone] ?   ?Current Level of Care: ?Hospital Recommended Level of Care: ?[place] Prior Approval Number: ?  ? ?Date Approved/Denied: ?  PASRR Number: ?[id number] A ? ?Discharge Plan: ?SNF ?  ? ?Current Diagnoses: ?Patient Active Problem List  ? Diagnosis Date Noted  ? Acute pulmonary embolism ([place]) 11/27/2021  ? Myoclonic jerking 11/27/2021  ? AKI (acute kidney injury) ([place]) 11/27/2021  ? Generalized weakness 11/27/2021  ? Pulmonary embolism ([place]) 11/26/2021  ? 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 Heart block AV complete ([place]) 10/26/2021  ? Acute MI, inferior wall ([place])   ? S/P reverse total shoulder arthroplasty, right 10/20/2021  ? Rotator cuff arthropathy of right shoulder 09/22/2021  ? Type I or II open comminuted intra-articular fracture of distal end of left femur, initial encounter ([place]) 10/15/2020  ? Closed fracture of left distal femur ([place]) 10/14/2020  ? Genetic testing 04/21/2017  ? Family history of breast cancer   ? Malignant neoplasm of upper-outer quadrant of left breast in female, estrogen receptor positive ([place]) 03/11/2017  ? Hypertension   ? Hypothyroidism   ? Osteoporosis   ? Anxiety   ? Acute blood loss anemia 09/19/2012  ? Chronic pain 09/19/2012  ? Osteoporosis with fracture 09/19/2012  ? Closed supracondylar fracture of right femur ([place]) 09/18/2012  ? ? ?Orientation RESPIRATION BLADDER Height & Weight   ?  ?Self, Time, Situation, Place ? O2 (2L) Continent, External catheter Weight: 247 lb 2.2 oz (112.1 kg) ?Height:  4'  11" (149.9 cm)  ?BEHAVIORAL SYMPTOMS/MOOD NEUROLOGICAL BOWEL NUTRITION STATUS  ?    Continent Diet (See DC summary)  ?AMBULATORY STATUS COMMUNICATION OF NEEDS Skin   ?Limited Assist Verbally Surgical wounds (dry, ecchymosis, past surgical incisions) ?  ?  ?  ?    ?     ?     ? ? ?Personal Care Assistance Level of Assistance  ?Bathing, Feeding, Dressing Bathing Assistance: Limited assistance ?Feeding assistance: Independent ?Dressing Assistance: Limited assistance ?   ? ?Functional Limitations Info  ?Hearing, Speech Sight Info: Impaired ?Hearing Info: Adequate ?Speech Info: Impaired  ? ? ?SPECIAL CARE FACTORS FREQUENCY  ?PT (By licensed PT), OT (By licensed OT)   ?  ?PT Frequency: 5x weekly ?OT Frequency: 5 time weekly ?  ?  ?  ?   ? ? ?Contractures Contractures Info: Not present  ? ? ?Additional Factors Info  ?Code Status, Allergies, Insulin Sliding Scale Code Status Info: Full ?Allergies Info: Nsaids, Ibuprogen, Tolmetin ?  ?Insulin Sliding Scale Info: See DC summary ?  ?   ? ?Current Medications (11/29/2021):  This is the current hospital active medication list ?Current Facility-Administered Medications  ?Medication Dose Route Frequency Provider Last Rate Last Admin  ? amiodarone (PACERONE) tablet 200 mg  200 mg Oral Daily Rise Patience, MD   200 mg at 11/29/21 5681  ? anastrozole (ARIMIDEX) tablet 1 mg  1 mg Oral Daily Rise Patience, MD   1 mg at 11/29/21 2751  ? buPROPion (WELLBUTRIN XL) 24  hr tablet 300 mg  300 mg Oral Daily Rise Patience, MD   300 mg at 11/29/21 2778  ? carbamazepine (TEGRETOL XR) 12 hr tablet 400 mg  400 mg Oral BID Rise Patience, MD   400 mg at 11/29/21 2423  ? cholecalciferol (VITAMIN D3) tablet 400 Units  400 Units Oral Daily Rise Patience, MD   400 Units at 11/29/21 5361  ? clopidogrel (PLAVIX) tablet 75 mg  75 mg Oral Daily Rise Patience, MD   75 mg at 11/29/21 4431  ? cyclobenzaprine (FLEXERIL) tablet 10 mg  10 mg Oral TID PRN Rise Patience, MD   10 mg at 11/28/21 0844  ? DULoxetine (CYMBALTA) DR capsule 30 mg  30 mg Oral BID Rise Patience, MD   30 mg at 11/29/21 5400  ? enoxaparin (LOVENOX) injection 110 mg  110 mg Subcutaneous [address] [place]   110 mg at 11/29/21 1343  ? ferrous sulfate tablet 325 mg  325 mg Oral Q breakfast Rise Patience, MD   325 mg at 11/29/21 8676  ? gabapentin (NEURONTIN) capsule 200 mg  200 mg Oral TID Rise Patience, MD   200 mg at 11/29/21 1950  ? latanoprost (XALATAN) 0.005 % ophthalmic solution 1 drop  1 drop Both Eyes QHS Rise Patience, MD   1 drop at 11/28/21 2207  ? levothyroxine (SYNTHROID) tablet 88 mcg  88 mcg Oral Daily Rise Patience, MD   88 mcg at 11/29/21 9326  ? liothyronine (CYTOMEL) tablet 25 mcg  25 mcg Oral Daily Rise Patience, MD   25 mcg at 11/29/21 7124  ? metoprolol succinate (TOPROL-XL) 24 hr tablet 12.5 mg  12.5 mg Oral Daily Freada Bergeron, MD   12.5 mg at 11/29/21 1215  ? oxyCODONE-acetaminophen (PERCOCET/ROXICET) 5-325 MG per tablet 1-2 tablet  1-2 tablet Oral Q6H PRN Shalhoub, Sherryll Burger, MD   2 tablet at 11/29/21 1214  ? potassium chloride SA (KLOR-CON M) CR tablet 20 mEq  20 mEq Oral Daily Rise Patience, MD   20 mEq at 11/29/21 5809  ? QUEtiapine (SEROQUEL) tablet 200 mg  200 mg Oral QHS Rise Patience, MD   200 mg at 11/28/21 2206  ? rosuvastatin (CRESTOR) tablet 10 mg  10 mg Oral Daily Rise Patience, MD   10 mg at 11/29/21 9833  ? simethicone (MYLICON) chewable tablet 80 mg  80 mg Oral Q6H PRN Rise Patience, MD      ? vortioxetine HBr (TRINTELLIX) tablet 20 mg  20 mg Oral Daily Rise Patience, MD   20 mg at 11/29/21 8250  ? warfarin (COUMADIN) tablet 5 mg  5 mg Oral [address] [place], [place]      ? Warfarin - Pharmacist Dosing Inpatient   Does not apply [place], [place]      ? ? ? ?Discharge Medications: ?Please see discharge summary for a list of discharge  medications. ? ?Relevant Imaging Results: ? ?Relevant Lab Results: ? ? ?Additional Information ?SSN# [id number] ? ?Bary Castilla, LCSW ? ? ? ? ?

## 2021-11-29 NOTE — Progress Notes (Signed)
Inpatient Rehab Admissions: ? ?Inpatient Rehab Consult received.  I met with patient at the bedside for rehabilitation assessment and to discuss goals and expectations of an inpatient rehab admission.  Pt would prefer a SNF placement for rehab instead of CIR d/t not having adequate supervision/support at home. TOC made aware. AC will sign off.  ? ?Signed: ?Gayland Curry, MS, CCC-SLP ?Admissions Coordinator ?[phone] ? ? ?

## 2021-11-29 NOTE — Progress Notes (Signed)
Inpatient Rehab Admissions Coordinator Note:  ? ?Per PT/OT patient was screened for CIR candidacy by Gari Hartsell Danford Bad, CCC-SLP. At this time, pt appears to be a potential candidate for CIR. I will place an order for rehab consult for full assessment, per our protocol.  Please contact me any with questions.. ? ? ?Gayland Curry, MS, CCC-SLP ?Admissions Coordinator ?[phone] ?11/29/21 ?9:30 AM ? ?

## 2021-11-30 ENCOUNTER — Ambulatory Visit: Payer: Medicare Other | Admitting: Physician Assistant

## 2021-11-30 DIAGNOSIS — Z951 Presence of aortocoronary bypass graft: Secondary | ICD-10-CM

## 2021-11-30 DIAGNOSIS — D6869 Other thrombophilia: Secondary | ICD-10-CM

## 2021-11-30 DIAGNOSIS — I480 Paroxysmal atrial fibrillation: Secondary | ICD-10-CM

## 2021-11-30 DIAGNOSIS — I50810 Right heart failure, unspecified: Secondary | ICD-10-CM

## 2021-11-30 DIAGNOSIS — D649 Anemia, unspecified: Secondary | ICD-10-CM

## 2021-11-30 DIAGNOSIS — E785 Hyperlipidemia, unspecified: Secondary | ICD-10-CM

## 2021-11-30 DIAGNOSIS — R5381 Other malaise: Secondary | ICD-10-CM

## 2021-11-30 LAB — CBC
HCT: 29.5 % — ABNORMAL LOW (ref 36.0–46.0)
Hemoglobin: 9.4 g/dL — ABNORMAL LOW (ref 12.0–15.0)
MCH: 31.1 pg (ref 26.0–34.0)
MCHC: 31.9 g/dL (ref 30.0–36.0)
MCV: 97.7 fL (ref 80.0–100.0)
Platelets: 369 10*3/uL (ref 150–400)
RBC: 3.02 MIL/uL — ABNORMAL LOW (ref 3.87–5.11)
RDW: 15.7 % — ABNORMAL HIGH (ref 11.5–15.5)
WBC: 5.6 10*3/uL (ref 4.0–10.5)
nRBC: 0 % (ref 0.0–0.2)

## 2021-11-30 LAB — BASIC METABOLIC PANEL
Anion gap: 6 (ref 5–15)
BUN: 21 mg/dL (ref 8–23)
CO2: 29 mmol/L (ref 22–32)
Calcium: 8.6 mg/dL — ABNORMAL LOW (ref 8.9–10.3)
Chloride: 103 mmol/L (ref 98–111)
Creatinine, Ser: 0.7 mg/dL (ref 0.44–1.00)
GFR, Estimated: >60 mL/min (ref >60)
Glucose, Bld: 100 mg/dL — ABNORMAL HIGH (ref 70–99)
Potassium: 4.5 mmol/L (ref 3.5–5.1)
Sodium: 138 mmol/L (ref 135–145)

## 2021-11-30 LAB — PROTIME-INR
INR: 1.1 (ref 0.8–1.2)
Prothrombin Time: 14.1 seconds (ref 11.4–15.2)

## 2021-11-30 LAB — MAGNESIUM: Magnesium: 2 mg/dL (ref 1.7–2.4)

## 2021-11-30 MED ORDER — WARFARIN SODIUM 5 MG PO TABS
5.0000 mg | ORAL_TABLET | Freq: Once | ORAL | Status: AC
Start: 2021-11-30 — End: 2021-11-30
  Administered 2021-11-30: 5 mg via ORAL
  Filled 2021-11-30: qty 1

## 2021-11-30 MED ORDER — FUROSEMIDE 10 MG/ML IJ SOLN
40.0000 mg | Freq: Once | INTRAMUSCULAR | Status: AC
  Administered 2021-11-30: 40 mg via INTRAVENOUS
  Filled 2021-11-30: qty 4

## 2021-11-30 NOTE — Progress Notes (Signed)
Physical Therapy Treatment ?Patient Details ?Name: Deborah Cooke ?MRN: [id number] ?DOB: 01/17/1951 ?Today's Date: 11/30/2021 ? ? ?History of Present Illness 71 yo female admitted 3/9 with SOB and acute PE. PMhx: admission 10/26/21 with STEMI and fall at home. S/p CABGx 4 10/27/21, Rt TSA 10/20/21, breast CA, depression, anxiety, anemia, HTN, thyroid dz, Left femur ORIF ? ?  ?PT Comments  ? ? Pt pleasant and familiar from last admission. Pt with continued progression in gait and activity tolerance yet unable to recall sternal precautions. Pt remains limited in gait and mobility tolerance and reports in her 4 days at home she slid out of the chair and was unable to get up on several occasions. Reinforced precautions and need for assist acutely as well as HEP. Will continue to follow.  ?   ?Recommendations for follow up therapy are one component of a multi-disciplinary discharge planning process, led by the attending physician.  Recommendations may be updated based on patient status, additional functional criteria and insurance authorization. ? ?Follow Up Recommendations ? Skilled nursing-short term rehab (<3 hours/day) ?  ?  ?Assistance Recommended at Discharge Frequent or constant Supervision/Assistance  ?Patient can return home with the following A little help with walking and/or transfers;A little help with bathing/dressing/bathroom;Assistance with cooking/housework;Assist for transportation ?  ?Equipment Recommendations ? BSC/3in1  ?  ?Recommendations for Other Services   ? ? ?  ?Precautions / Restrictions Precautions ?Precautions: Sternal;Fall;Shoulder ?Type of Shoulder Precautions: R TSA 1/31, sling discontinued ?Restrictions ?RUE Weight Bearing: Non weight bearing (ok for RW)  ?  ? ?Mobility ? Bed Mobility ?  ?  ?  ?  ?  ?  ?  ?General bed mobility comments: on BsC on arrival and recliner end of session ?  ? ?Transfers ?Overall transfer level: Needs assistance ?  ?Transfers: Sit to/from Stand ?Sit to Stand: Min guard ?   ?  ?  ?  ?  ?General transfer comment: cues for hand placement to maintain precautions with pt completing 4 trials with continued need for reinforcement ?  ? ?Ambulation/Gait ?Ambulation/Gait assistance: Min assist, +2 safety/equipment ?Gait Distance (Feet): 45 Feet ?Assistive device: Rolling walker (2 wheels) ?Gait Pattern/deviations: Step-through pattern, Decreased stride length ?  ?Gait velocity interpretation: <1.8 ft/sec, indicate of risk for recurrent falls ?  ?General Gait Details: cues for posture, safety, decreased UE use due to precautions. PT walked 30', 20', 45' respectively with seated rest between each and close chair follow. SpO2 97% on RA and HR 97 ? ? ?Stairs ?  ?  ?  ?  ?  ? ? ?Wheelchair Mobility ?  ? ?Modified Rankin (Stroke Patients Only) ?  ? ? ?  ?Balance Overall balance assessment: Needs assistance ?Sitting-balance support: No upper extremity supported, Feet supported ?Sitting balance-Leahy Scale: Fair ?  ?  ?Standing balance support: Bilateral upper extremity supported, During functional activity ?Standing balance-Leahy Scale: Poor ?Standing balance comment: Reliant on UE support ?  ?  ?  ?  ?  ?  ?  ?  ?  ?  ?  ?  ? ?  ?Cognition Arousal/Alertness: Awake/alert ?Behavior During Therapy: [facility] for tasks assessed/performed ?Overall Cognitive Status: Impaired/Different from baseline ?  ?  ?  ?  ?  ?  ?  ?  ?  ?  ?  ?Memory: Decreased recall of precautions ?  ?  ?  ?  ?General Comments: pt only able to recall single sternal precaution with education for all provided ?  ?  ? ?  ?  Exercises General Exercises - Lower Extremity ?Long Arc Quad: Both, Seated, 20 reps, AROM ?Hip Flexion/Marching: AROM, Both, Seated, 20 reps ? ?  ?General Comments   ?  ?  ? ?Pertinent Vitals/Pain Pain Assessment ?Pain Assessment: No/denies pain  ? ? ?Home Living   ?  ?  ?  ?  ?  ?  ?  ?  ?  ?   ?  ?Prior Function    ?  ?  ?   ? ?PT Goals (current goals can now be found in the care plan section) Progress towards PT goals:  Progressing toward goals ? ?  ?Frequency ? ? ? Min 3X/week ? ? ? ?  ?PT Plan Current plan remains appropriate  ? ? ?Co-evaluation   ?  ?  ?  ?  ? ?  ?AM-PAC PT "6 Clicks" Mobility   ?Outcome Measure ? Help needed turning from your back to your side while in a flat bed without using bedrails?: A Little ?Help needed moving from lying on your back to sitting on the side of a flat bed without using bedrails?: A Little ?Help needed moving to and from a bed to a chair (including a wheelchair)?: A Little ?Help needed standing up from a chair using your arms (e.g., wheelchair or bedside chair)?: A Little ?Help needed to walk in hospital room?: A Lot ?Help needed climbing 3-5 steps with a railing? : Total ?6 Click Score: 15 ? ?  ?End of Session Equipment Utilized During Treatment: Gait belt ?Activity Tolerance: Patient tolerated treatment well ?Patient left: in chair;with call bell/phone within reach;with chair alarm set ?Nurse Communication: Mobility status ?PT Visit Diagnosis: Other abnormalities of gait and mobility (R26.89);Difficulty in walking, not elsewhere classified (R26.2);Muscle weakness (generalized) (M62.81);Unsteadiness on feet (R26.81) ?  ? ? ?Time: 0175-1025 ?PT Time Calculation (min) (ACUTE ONLY): 26 min ? ?Charges:  $Gait Training: 8-22 mins ?$Therapeutic Exercise: 8-22 mins          ?          ? ?Calixto Pavel P, PT ?Acute Rehabilitation Services ?Pager: [phone] ?Office: [phone] ? ? ? ?Deborah Cooke ?11/30/2021, 10:34 AM ? ?

## 2021-11-30 NOTE — Progress Notes (Signed)
? ?Progress Note ? ?Patient Name: Deborah Cooke ?Date of Encounter: 11/30/2021 ? ?Primary Cardiologist:   Larae Grooms, MD ? ? ?Subjective  ? ?No acute complaints.  No SOB or chest pain.  ? ?Inpatient Medications  ?  ?Scheduled Meds: ? amiodarone  200 mg Oral Daily  ? anastrozole  1 mg Oral Daily  ? buPROPion  300 mg Oral Daily  ? carbamazepine  400 mg Oral BID  ? cholecalciferol  400 Units Oral Daily  ? clopidogrel  75 mg Oral Daily  ? DULoxetine  30 mg Oral BID  ? enoxaparin (LOVENOX) injection  110 mg Subcutaneous Q12H  ? ferrous sulfate  325 mg Oral Q breakfast  ? gabapentin  200 mg Oral TID  ? latanoprost  1 drop Both Eyes QHS  ? levothyroxine  88 mcg Oral Daily  ? liothyronine  25 mcg Oral Daily  ? metoprolol succinate  12.5 mg Oral Daily  ? potassium chloride SA  20 mEq Oral Daily  ? QUEtiapine  200 mg Oral QHS  ? rosuvastatin  10 mg Oral Daily  ? vortioxetine HBr  20 mg Oral Daily  ? Warfarin - Pharmacist Dosing Inpatient   Does not apply A1655  ? ?Continuous Infusions: ? ?PRN Meds: ?cyclobenzaprine, oxyCODONE-acetaminophen, simethicone  ? ?Vital Signs  ?  ?Vitals:  ? 11/29/21 1215 11/29/21 1256 11/29/21 2000 11/30/21 0653  ?BP: 113/72  112/79 108/67  ?Pulse: 80 85 84 86  ?Resp:  '16 17 18  '$ ?Temp:  98.1 ?F (36.7 ?C) 98.1 ?F (36.7 ?C) 98.6 ?F (37 ?C)  ?TempSrc:  Oral Oral Oral  ?SpO2:  96% 100% 93%  ?Weight:      ?Height:      ? ? ?Intake/Output Summary (Last 24 hours) at 11/30/2021 0731 ?Last data filed at 11/29/2021 2001 ?Gross per 24 hour  ?Intake 459.64 ml  ?Output 900 ml  ?Net -440.36 ml  ? ?Filed Weights  ? 11/27/21 0359 11/28/21 0535 11/29/21 0605  ?Weight: 111.1 kg 111.7 kg 112.1 kg  ? ? ?Telemetry  ?  ?NSR, rare ectopy - Personally Reviewed ? ?ECG  ?  ?NA - Personally Reviewed ? ?Physical Exam  ? ?GEN: No acute distress.   ?Neck:   Positive JVD to jaw at 90 degrees  ?Cardiac: RRR, no murmurs, rubs, or gallops.  ?Respiratory: Clear to auscultation bilaterally. ?GI: Soft, nontender, non-distended   ?MS:    Moderately severe edema; No deformity. ?Neuro:  Nonfocal  ?Psych: Normal affect  ? ?Labs  ?  ?Chemistry ?Recent Labs  ?Lab 11/26/21 ?1815 11/27/21 ?3748 11/28/21 ?2707 11/29/21 ?0102 11/30/21 ?0153  ?NA  --  137 137 138 138  ?K  --  3.9 4.4 4.3 4.5  ?CL  --  101 100 104 103  ?CO2  --  '28 29 26 29  '$ ?GLUCOSE  --  94 103* 96 100*  ?BUN  --  24* '22 21 21  '$ ?CREATININE  --  1.14* 0.94 0.65 0.70  ?CALCIUM  --  8.1* 8.3* 8.3* 8.6*  ?PROT 6.1* 5.0*  --   --   --   ?ALBUMIN 2.8* 2.4*  --   --   --   ?AST 17 14*  --   --   --   ?ALT 14 11  --   --   --   ?ALKPHOS 139* 109  --   --   --   ?BILITOT 0.5 0.4  --   --   --   ?GFRNONAA  --  51* >60 >60 >60  ?ANIONGAP  --  '8 8 8 6  '$ ?  ? ?Hematology ?Recent Labs  ?Lab 11/28/21 ?0213 11/29/21 ?0102 11/30/21 ?0153  ?WBC 6.0 5.1 5.6  ?RBC 2.80* 2.88* 3.02*  ?HGB 8.6* 8.9* 9.4*  ?HCT 27.2* 28.6* 29.5*  ?MCV 97.1 99.3 97.7  ?MCH 30.7 30.9 31.1  ?MCHC 31.6 31.1 31.9  ?RDW 15.9* 15.8* 15.7*  ?PLT 307 315 369  ? ? ?Cardiac EnzymesNo results for input(s): TROPONINI in the last 168 hours. No results for input(s): TROPIPOC in the last 168 hours.  ? ?BNP ?Recent Labs  ?Lab 11/26/21 ?1119  ?BNP 482.4*  ?  ? ?DDimer No results for input(s): DDIMER in the last 168 hours.  ? ?Radiology  ?  ?No results found. ? ?Cardiac Studies  ? ?CTA:  11/26/2021 ? ?IMPRESSION: ?1. Small segmental acute pulmonary emboli in the right upper lobe ?and right lower lobe pulmonary artery. ?2. Prominent main pulmonary artery at 4.5 cm in transverse diameter ?indicating pulmonary arterial hypertension. Underlying cardiomegaly ?and coronary atherosclerosis noted. ?3. Recent CABG with healing median sternotomy and continued edema ?adjacent to the sternotomy site and anterior mediastinum. Small ?pericardial effusion. ?4. Mild mosaic attenuation in the lungs, potentially reflecting a ?component of mild edema or bronchiolitis obliterans. ?5. Subacute fractures of the right anterior second through seventh ?ribs. Likely  older fractures of the left anterior third through ?seventh ribs. ?6. Other imaging findings of potential clinical significance: Trace ?left pleural effusion. Radiation therapy related findings anteriorly ?in the left lung. Prior gastric bypass. Mitral valve and aortic ?valve calcification. ?  ?Patient Profile  ?   ?71 y.o. female with PMH STEMI 10/26/21, CHB, now s/p CABG. Presenting with shortness of breath and PE. ? ?Assessment & Plan  ?  ?Acute subsegmental PE:    Transition to warfarin (medication interaction between DOAC and HA med; patient prefers to stay on current HA med) ?  ?Concern for pulmonary hypertension:    Evident on TTE and noted prior to the PE.  Holding off on right heart cath which could be done in the future after an adequate treatment of the PE.  Given low dose diuretic yesterday and the I/O are incomplete.   Discusses with the patient keeping the feet elevated.  Discussed with nursing the need for strict I/O.    Give IV Lasix 40 mg today.  (She did have trouble with hypotension a couple of days ago.)  ?  ?Hypotension:  As above.  ? ?Recent STEMI, s/p CABG 4v:  Continue current therapy.  ?  ?Post op paroxysmal atrial fibrillation:  Continue amio.  Maintaining NSR.  ?  ?LE Edema:  As above.  ? ?For questions or updates, please contact [place] ?Please consult [email or web address] for contact info under Cardiology/STEMI. ?  ?Signed, ?Minus Breeding, MD  ?11/30/2021, 7:31 AM   ? ?

## 2021-11-30 NOTE — Progress Notes (Signed)
ANTICOAGULATION CONSULT NOTE  ? ?Pharmacy Consult for Warfarin ?Indication: pulmonary embolus ? ?Allergies  ?Allergen Reactions  ? Nsaids Nausea And Vomiting and Other (See Comments)  ?  GI Upset (ibuprofen included)  ? Ibuprofen Other (See Comments)  ?  Other reaction(s): Abdominal Pain  ? Tolmetin Other (See Comments)  ?  Other reaction(s): Abdominal Pain ?  ? ? ?Patient Measurements: ?Height: '4\' 11"'$  (149.9 cm) ?Weight: 112.1 kg (247 lb 2.2 oz) ?IBW/kg (Calculated) : 43.2 ? ? ?Vital Signs: ?Temp: 98.8 ?F (37.1 ?C) (03/13 1318) ?Temp Source: Oral (03/13 1318) ?BP: 118/74 (03/13 1318) ?Pulse Rate: 90 (03/13 1318) ? ?Labs: ?Recent Labs  ?  11/28/21 ?0213 11/29/21 ?0102 11/29/21 ?1141 11/30/21 ?0153  ?HGB 8.6* 8.9*  --  9.4*  ?HCT 27.2* 28.6*  --  29.5*  ?PLT 307 315  --  369  ?LABPROT  --   --  13.6 14.1  ?INR  --   --  1.0 1.1  ?HEPARINUNFRC 0.31 0.36  --   --   ?CREATININE 0.94 0.65  --  0.70  ? ? ? ?Estimated Creatinine Clearance: 70 mL/min (by C-G formula based on SCr of 0.7 mg/dL). ? ? ?Medical History: ?Past Medical History:  ?Diagnosis Date  ? Adverse effect of anesthetic 01/2012  ? difficulty waking up.  ? Adverse effect of unspecified anesthetic, initial encounter 08/2011  ? low o2 sats.  ? Anemia   ? Anxiety   ? Arthritis   ? Breast cancer ([place]) 02/16/2017  ? left breast  ? Chronic pain   ? Closed supracondylar fracture of right femur ([place]) 09/18/2012  ? Depression   ? Family history of breast cancer   ? GERD (gastroesophageal reflux disease)   ? pt denies  ? Headache(784.0)   ? Hypertension   ? Hypothyroidism   ? Opioid dependence in controlled environment [facility])   ? Osteoporosis   ? Osteoporosis with fracture 09/19/2012  ? Pneumonia   ? Thyroid disease   ? ? ?Medications:  ?Scheduled:  ? amiodarone  200 mg Oral Daily  ? anastrozole  1 mg Oral Daily  ? buPROPion  300 mg Oral Daily  ? carbamazepine  400 mg Oral BID  ? cholecalciferol  400 Units Oral Daily  ? clopidogrel  75 mg Oral Daily  ? DULoxetine  30 mg  Oral BID  ? enoxaparin (LOVENOX) injection  110 mg Subcutaneous Q12H  ? ferrous sulfate  325 mg Oral Q breakfast  ? gabapentin  200 mg Oral TID  ? latanoprost  1 drop Both Eyes QHS  ? levothyroxine  88 mcg Oral Daily  ? liothyronine  25 mcg Oral Daily  ? metoprolol succinate  12.5 mg Oral Daily  ? potassium chloride SA  20 mEq Oral Daily  ? QUEtiapine  200 mg Oral QHS  ? rosuvastatin  10 mg Oral Daily  ? vortioxetine HBr  20 mg Oral Daily  ? Warfarin - Pharmacist Dosing Inpatient   Does not apply W1093  ? ? ?Assessment: ?73 yof with a history of three vessel CAD s/p CABG last month, anemia, anxiety, breast cancer, chronic pain, depression, HTN, hypothyroidism, osteoporosis, pna. Patient is presenting with SOB. Heparin per pharmacy consult placed for pulmonary embolus now to transition to warfarin. Pharmacy to dose warfarin.  ? ?Patient has no prior history of utilizing warfarin. Currently on carbamazepine 400 mg BID which interacts with warfarin and can decrease warfarin's effect - will monitor with daily INR until discharged. Also taking amiodarone which can  increase the effects of warfarin. Will monitor as well. Given new start, will need to bridge with enoxaparin 110 mg (1 mg/kg) BID for 5 days or until INR therapeutic.  ? ?INR today 1.1.  No overt bleeding or complications noted. ? ? ?Goal of Therapy:  ?INR 2-3 ?Monitor platelets by anticoagulation protocol: Yes ?  ?Plan:  ?Continue enoxaparin 110 mg SQ BID ?Warfarin 5 mg x 1 again today ?Daily INR, CBC ?Monitor for signs and symptoms of bleeding ? ?Thank you for involving pharmacy in this patient's care. ? ?[place] Crane, Pharm D, BCPS, BCCP ?Clinical Pharmacist ? 11/30/2021 2:13 PM  ? ?[facility] pharmacy phone numbers are listed on [email or web address] ? ?

## 2021-11-30 NOTE — Progress Notes (Addendum)
?PROGRESS NOTE ? ? ? ?Deborah Cooke  JEH:[id number] DOB: November 25, 1950 DOA: 11/26/2021 ?PCP: Rich Fuchs, PA  ? ? ?Brief Narrative:  ? Deborah Cooke is a 71 y.o. female with past medical history of myocardial infarction status post CABG in October 27, 2021 who was discharged to rehab and subsequently went home 4 days back presented to hospital with decreased ambulation, increasing shortness of breath and generalized weakness with lower extremity edema.  Patient's son also noted that that she was having some jerks of her extremities.  Patient was then brought into the hospital.  In the ED, CT angiogram of the chest showed small segmental right upper lobe and lower lobe pulmonary embolism.  Dopplers were negative for DVT.  Cardiology was consulted.  Patient was admitted to the hospital for  further management of acute pulm embolism and evaluation of generalized weakness and shortness of breath..  Patient was seen by cardiology during hospitalization and was started on heparin drip.  Due to significant interaction with carbamazepine Coumadin was initiated instead of DOAC.  ? ?  ?Assessment and Plan: ?* Acute pulmonary embolism ([place]) ?Likely secondary to sedentary status at home.  Lower extremity Dopplers were negative for DVT.  On Coumadin and Lovenox at this time.  Unable to do oral DOAC due to interaction with carbamazepine.  Patient would like to be on carbamazepine which helps her with her headache an would need taper slowly as outpatient.  Pharmacy managing.  INR of 1.1 today.  Continue dosing with Coumadin. ? ?Myoclonic jerking ?Improved at this time.  Seen by neurology.  EEG unremarkable for seizures.  Patient is on Tegretol as outpatient.  CT head was unremarkable.  ABG unremarkable. ? 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  Patient does have peripheral edema.  Has not had a heart cath in the past.  Might need sleep study as outpatient for obstructive sleep apnea.   trial of Lasix today.  Cardiology on board.  Continue beta-blockers, Plavix.  No acute issues ? ?Hypertension ?Improvement of blood pressure at this time.  Was transiently hypotensive and required IV fluids.  On long-acting metoprolol at this time.  Plan for Lasix today. ? ?Malignant neoplasm of upper-outer quadrant of left breast in female, estrogen receptor positive ([place]) ?In remission.  On anastrozole. ? ?Hypothyroidism ?Continue Synthroid. ? ?S/P reverse total shoulder arthroplasty, right ?Continue supportive care.  Off sling recently. ? ?Chronic pain ?On Flexeril, gabapentin, PRN Percocet. ? ?AKI (acute kidney injury) ([place]) ?Mild.  Improved at this time.  Creatinine of 0.6 at this time ? ?Generalized weakness ?Debility, deconditioning/ambulatory dysfunction at home though she was recently admitted and discharged from a rehabilitation facility.  Family unable to take care of her at home.  Patient was seen by physical therapy and therapy on 11/28/2021 with recommendations for acute inpatient rehab, recommendation now is to skilled nursing facility. ? ? ? DVT prophylaxis:   Heparin drip, will transition to Eliquis. ? ?Code Status:   ?  Code Status: Full Code ? ?Disposition: Patient has been seen by physical therapy who recommended skilled nursing facility at this time ? ?Status is: Inpatient ? ?Remains inpatient appropriate because:  ?Pulmonary embolism on anticoagulation, extreme debility weakness needing rehabilitation, need for Coumadin dosing and monitoring. ? ? Family Communication:  ?Spoke with the patient's son on 11/28/2021 ? ?Consultants:  ?Cardiology ?Neurology ? ?Procedures:  ?None ? ?Antimicrobials:  ?None ? ?Anti-infectives (From admission, onward)  ? ? None  ? ?  ? ?  Subjective: ?Today, patient was seen and examined at bedside.  Denies any chest pain, nausea, vomiting, fever or chills.  Has generalized weakness. ? ?Objective: ?Vitals:  ? 11/30/21 0653 11/30/21 0838 11/30/21 1028 11/30/21 1239  ?BP:  108/67 125/80  118/79  ?Pulse: 86 95 97   ?Resp: 18   17  ?Temp: 98.6 ?F (37 ?C)   98.7 ?F (37.1 ?C)  ?TempSrc: Oral   Oral  ?SpO2: 93% 91% 95%   ?Weight:      ?Height:      ? ? ?Intake/Output Summary (Last 24 hours) at 11/30/2021 1311 ?Last data filed at 11/29/2021 2001 ?Gross per 24 hour  ?Intake 120 ml  ?Output 900 ml  ?Net -780 ml  ? ?Filed Weights  ? 11/27/21 0359 11/28/21 0535 11/29/21 0605  ?Weight: 111.1 kg 111.7 kg 112.1 kg  ? ?Body mass index is 49.92 kg/m?.  ? ?Physical Examination: ? ?General: Obese built, not in obvious distress on nasal cannula oxygen ?HENT:   No scleral pallor or icterus noted. Oral mucosa is moist.  ?Chest:  Clear breath sounds.  Diminished breath sounds bilaterally.  CABG scar noted. ?CVS: S1 &S2 heard. No murmur.  Regular rate and rhythm. ?Abdomen: Soft, nontender, nondistended.  Bowel sounds are heard.   ?Extremities: No cyanosis, clubbing with bilateral lower extremity edema.  Peripheral pulses are palpable.  Right shoulder scar noted. ?Psych: Alert, awake and oriented, normal mood ?CNS:  No cranial nerve deficits.  Weakness of the lower extremities noted. ?Skin: Warm and dry.  No rashes noted. ? ? ?Data Reviewed:  ? ?CBC: ?Recent Labs  ?Lab 11/26/21 ?1119 11/27/21 ?0204 11/28/21 ?0213 11/29/21 ?0102 11/30/21 ?0153  ?WBC 7.3 6.4 6.0 5.1 5.6  ?NEUTROABS 5.2  --   --   --   --   ?HGB 9.7* 8.3* 8.6* 8.9* 9.4*  ?HCT 30.8* 25.8* 27.2* 28.6* 29.5*  ?MCV 99.0 97.4 97.1 99.3 97.7  ?PLT 373 304 307 315 369  ? ? ?Basic Metabolic Panel: ?Recent Labs  ?Lab 11/26/21 ?1119 11/27/21 ?0204 11/28/21 ?0213 11/29/21 ?0102 11/30/21 ?0153  ?NA 136 137 137 138 138  ?K 3.8 3.9 4.4 4.3 4.5  ?CL 96* 101 100 104 103  ?CO2 '27 28 29 26 29  '$ ?GLUCOSE 92 94 103* 96 100*  ?BUN 27* 24* '22 21 21  '$ ?CREATININE 1.17* 1.14* 0.94 0.65 0.70  ?CALCIUM 8.8* 8.1* 8.3* 8.3* 8.6*  ?MG 2.1  --  1.9 1.9 2.0  ? ? ?Liver Function Tests: ?Recent Labs  ?Lab 11/26/21 ?1815 11/27/21 ?0204  ?AST 17 14*  ?ALT 14 11  ?ALKPHOS 139* 109   ?BILITOT 0.5 0.4  ?PROT 6.1* 5.0*  ?ALBUMIN 2.8* 2.4*  ? ? ? ?Radiology Studies: ?No results found. ? ? ? LOS: 3 days  ? ? ?Flora Lipps, MD ?[facility] ?11/30/2021, 1:11 PM  ?  ?

## 2021-11-30 NOTE — TOC Progression Note (Addendum)
Transition of Care (TOC) - Progression Note  ? ? ?Patient Details  ?Name: Deborah Cooke ?MRN: [id number] ?Date of Birth: 12/08/1950 ? ?Transition of Care (TOC) CM/SW Contact  ?Milas Gain, LCSWA ?Phone Number: ?11/30/2021, 4:39 PM ? ?Clinical Narrative:    ? ?Update-CSW started insurance authorization for patient. Reference number # [id number]. CSW will continue to follow. ? ?CSW spoke with patient at bedside and provided SNF bed offers. Patient chose SNF placement at [facility]. S[facility] with [facility] confirmed SNF bed offer for patient. CSW will start insurance authorization for patient. CSW will continue to follow and assist with patients dc planning needs. ? ?Expected Discharge Plan: [place] ?Barriers to Discharge: Continued Medical Work up, SNF Pending bed offer, Insurance Authorization ? ?Expected Discharge Plan and Services ?Expected Discharge Plan: [place] ?  ?  ?  ?Living arrangements for the past 2 months: [place] ?                ?  ?  ?  ?  ?  ?  ?  ?  ?  ?  ? ? ?Social Determinants of Health (SDOH) Interventions ?  ? ?Readmission Risk Interventions ?No flowsheet data found. ? ?

## 2021-12-01 DIAGNOSIS — I50813 Acute on chronic right heart failure: Secondary | ICD-10-CM

## 2021-12-01 LAB — CBC
HCT: 28 % — ABNORMAL LOW (ref 36.0–46.0)
Hemoglobin: 9.1 g/dL — ABNORMAL LOW (ref 12.0–15.0)
MCH: 31.5 pg (ref 26.0–34.0)
MCHC: 32.5 g/dL (ref 30.0–36.0)
MCV: 96.9 fL (ref 80.0–100.0)
Platelets: 337 10*3/uL (ref 150–400)
RBC: 2.89 MIL/uL — ABNORMAL LOW (ref 3.87–5.11)
RDW: 15.7 % — ABNORMAL HIGH (ref 11.5–15.5)
WBC: 5.2 10*3/uL (ref 4.0–10.5)
nRBC: 0 % (ref 0.0–0.2)

## 2021-12-01 LAB — RESP PANEL BY RT-PCR (FLU A&B, COVID) ARPGX2
Influenza A by PCR: NEGATIVE
Influenza B by PCR: NEGATIVE
SARS Coronavirus 2 by RT PCR: NEGATIVE

## 2021-12-01 LAB — BASIC METABOLIC PANEL
Anion gap: 8 (ref 5–15)
BUN: 20 mg/dL (ref 8–23)
CO2: 28 mmol/L (ref 22–32)
Calcium: 8.6 mg/dL — ABNORMAL LOW (ref 8.9–10.3)
Chloride: 101 mmol/L (ref 98–111)
Creatinine, Ser: 0.74 mg/dL (ref 0.44–1.00)
GFR, Estimated: >60 mL/min (ref >60)
Glucose, Bld: 95 mg/dL (ref 70–99)
Potassium: 4.1 mmol/L (ref 3.5–5.1)
Sodium: 137 mmol/L (ref 135–145)

## 2021-12-01 LAB — PROTIME-INR
INR: 1.4 — ABNORMAL HIGH (ref 0.8–1.2)
Prothrombin Time: 16.7 seconds — ABNORMAL HIGH (ref 11.4–15.2)

## 2021-12-01 MED ORDER — FUROSEMIDE 40 MG PO TABS
40.0000 mg | ORAL_TABLET | Freq: Every day | ORAL | Status: DC
  Administered 2021-12-01: 40 mg via ORAL
  Filled 2021-12-01: qty 1

## 2021-12-01 MED ORDER — OXYCODONE-ACETAMINOPHEN 10-325 MG PO TABS: 1.0000 | ORAL_TABLET | Freq: Three times a day (TID) | ORAL | 0 refills | Status: DC | PRN

## 2021-12-01 MED ORDER — WARFARIN SODIUM 5 MG PO TABS
5.0000 mg | ORAL_TABLET | Freq: Once | ORAL | Status: AC
  Administered 2021-12-01: 5 mg via ORAL
  Filled 2021-12-01: qty 1

## 2021-12-01 MED ORDER — ENOXAPARIN SODIUM 120 MG/0.8ML IJ SOSY: 110.0000 mg | PREFILLED_SYRINGE | Freq: Two times a day (BID) | INTRAMUSCULAR | Status: DC

## 2021-12-01 MED ORDER — WARFARIN SODIUM 2.5 MG PO TABS: 5.0000 mg | ORAL_TABLET | Freq: Every day | ORAL | Status: DC

## 2021-12-01 MED ORDER — METOPROLOL SUCCINATE ER 25 MG PO TB24: 12.5000 mg | ORAL_TABLET | Freq: Every day | ORAL | Status: DC

## 2021-12-01 NOTE — Progress Notes (Signed)
ANTICOAGULATION CONSULT NOTE  ? ?Pharmacy Consult for Warfarin ?Indication: pulmonary embolus ? ?Allergies  ?Allergen Reactions  ? Nsaids Nausea And Vomiting and Other (See Comments)  ?  GI Upset (ibuprofen included)  ? Ibuprofen Other (See Comments)  ?  Other reaction(s): Abdominal Pain  ? Tolmetin Other (See Comments)  ?  Other reaction(s): Abdominal Pain ?  ? ? ?Patient Measurements: ?Height: '4\' 11"'$  (149.9 cm) ?Weight: 110.6 kg (243 lb 13.3 oz) ?IBW/kg (Calculated) : 43.2 ? ? ?Vital Signs: ?Temp: 98.3 ?F (36.8 ?C) (03/14 1414) ?Temp Source: Oral (03/14 1414) ?BP: 101/62 (03/14 1414) ?Pulse Rate: 100 (03/14 1414) ? ?Labs: ?Recent Labs  ?  11/29/21 ?0102 11/29/21 ?1141 11/30/21 ?0153 12/01/21 ?0256  ?HGB 8.9*  --  9.4* 9.1*  ?HCT 28.6*  --  29.5* 28.0*  ?PLT 315  --  369 337  ?LABPROT  --  13.6 14.1 16.7*  ?INR  --  1.0 1.1 1.4*  ?HEPARINUNFRC 0.36  --   --   --   ?CREATININE 0.65  --  0.70 0.74  ? ? ? ?Estimated Creatinine Clearance: 69.4 mL/min (by C-G formula based on SCr of 0.74 mg/dL). ? ? ?Medical History: ?Past Medical History:  ?Diagnosis Date  ? Adverse effect of anesthetic 01/2012  ? difficulty waking up.  ? Adverse effect of unspecified anesthetic, initial encounter 08/2011  ? low o2 sats.  ? Anemia   ? Anxiety   ? Arthritis   ? Breast cancer ([place]) 02/16/2017  ? left breast  ? Chronic pain   ? Closed supracondylar fracture of right femur ([place]) 09/18/2012  ? Depression   ? Family history of breast cancer   ? GERD (gastroesophageal reflux disease)   ? pt denies  ? Headache(784.0)   ? Hypertension   ? Hypothyroidism   ? Opioid dependence in controlled environment [facility])   ? Osteoporosis   ? Osteoporosis with fracture 09/19/2012  ? Pneumonia   ? Thyroid disease   ? ? ?Medications:  ?Scheduled:  ? amiodarone  200 mg Oral Daily  ? anastrozole  1 mg Oral Daily  ? buPROPion  300 mg Oral Daily  ? carbamazepine  400 mg Oral BID  ? cholecalciferol  400 Units Oral Daily  ? clopidogrel  75 mg Oral Daily  ? DULoxetine   30 mg Oral BID  ? enoxaparin (LOVENOX) injection  110 mg Subcutaneous Q12H  ? ferrous sulfate  325 mg Oral Q breakfast  ? furosemide  40 mg Oral Daily  ? gabapentin  200 mg Oral TID  ? latanoprost  1 drop Both Eyes QHS  ? levothyroxine  88 mcg Oral Daily  ? liothyronine  25 mcg Oral Daily  ? metoprolol succinate  12.5 mg Oral Daily  ? potassium chloride SA  20 mEq Oral Daily  ? QUEtiapine  200 mg Oral QHS  ? rosuvastatin  10 mg Oral Daily  ? vortioxetine HBr  20 mg Oral Daily  ? [START ON 12/02/2021] warfarin  5 mg Oral ONCE-1600  ? Warfarin - Pharmacist Dosing Inpatient   Does not apply Q9450  ? ? 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 Patient is presenting with SOB. Heparin per pharmacy consult placed for pulmonary embolus now to transition to warfarin. Pharmacy to dose warfarin.  ? ?Patient has no prior history of utilizing warfarin. Currently on carbamazepine 400 mg BID which interacts with warfarin and  can decrease warfarin's effect - will monitor with daily INR until discharged. Also taking amiodarone which can increase the effects of warfarin. Will monitor as well. Given new start, will need to bridge with enoxaparin 110 mg (1 mg/kg) BID for 5 days or until INR therapeutic.  ? ?INR today 1.4.  No overt bleeding or complications noted. ? ? ?Goal of Therapy:  ?INR 2-3 ?Monitor platelets by anticoagulation protocol: Yes ?  ?Plan:  ?Continue enoxaparin 110 mg SQ BID ?Warfarin 5 mg x 1 again today ?Daily INR, CBC ?Monitor for signs and symptoms of bleeding ? ?Thank you for involving pharmacy in this patient's care. ? ?Benetta Spar, PharmD, BCPS, BCCP ?Clinical Pharmacist ? ?Please check AMION for all [place] phone numbers ?After 10:00 PM, call [place] [phone] ? ?

## 2021-12-01 NOTE — Discharge Summary (Signed)
?Physician Discharge Summary ?  ?Patient: Deborah Cooke MRN: [id number] DOB: Oct 03, 1950  ?Admit date:     11/26/2021  ?Discharge date: 12/01/21  ?Discharge Physician: Corrie Mckusick Chancellor Vanderloop  ? ?PCP: Rich Fuchs, PA  ? ?Recommendations at discharge:  ? ?Follow-up with your primary care provider at the skilled nursing facility in 3 to 5 days. ?Patient will need to follow-up of PT and INR on Thursday.  Aspirin has been discontinued.  Coumadin and Lovenox have been prescribed, please stop Lovenox when INR is 2 or more. ?Follow-up with cardiology as outpatient as scheduled by the clinic. ? ?Discharge Diagnoses: ?Principal Problem: ?  Acute pulmonary embolism ([place]) ?Active Problems: ?  Myoclonic jerking ?  S/P CABG x 4 ?  Hypertension ?  Hypothyroidism ?  Malignant neoplasm of upper-outer quadrant of left breast in female, estrogen receptor positive ([place]) ?  Chronic pain ?  S/P reverse total shoulder arthroplasty, right ?  AKI (acute kidney injury) ([place]) ?  Generalized weakness ?  Pulmonary embolism ([place]) ? ?Resolved Problems: ?  * No resolved hospital problems. * ? ?Hospital Course: ? Deborah Cooke is a 71 y.o. female with past medical history of myocardial infarction status post CABG in October 27, 2021 who was discharged to rehab and subsequently went home 4 days back presented to hospital with decreased ambulation, increasing shortness of breath and generalized weakness with lower extremity edema.  Patient's son also noted that that she was having some jerks of her extremities.  Patient was then brought into the hospital.  In the ED, CT angiogram of the chest showed small segmental right upper lobe and lower lobe pulmonary embolism.  Dopplers were negative for DVT.  Cardiology was consulted.  Patient was admitted to the hospital for  further management of acute pulm embolism and evaluation of generalized weakness and shortness of breath..   ? ?Assessment and Plan: ?* Acute pulmonary embolism ([place]) ?Likely secondary to  sedentary status at home.  Lower extremity Dopplers were negative for DVT.  On Coumadin and Lovenox at this time.  Unable to do oral DOAC due to interaction with carbamazepine.  Patient would like to be on carbamazepine which helps her with her headache and this would also need slow taper as outpatient.  At this time patient has been started on Coumadin with a goal of INR 2-3.  Lovenox has been started in the interim until INR is therapeutic.  Please discontinue Lovenox when INR is more than 2.  Aim for INR range 2-3 ? ?Myoclonic jerking ?Improved at this time.  Seen by neurology.  EEG was unremarkable for seizures.  Patient is on Tegretol as outpatient.  CT head was unremarkable.  ABG unremarkable. ? 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  Patient does have peripheral edema.  Has not had a heart cath in the past.  Might need sleep study as outpatient for obstructive sleep apnea.  Received IV Lasix 7.  Cardiology also followed the patient during hospitalization.  Continue beta-blockers, Plavix, Coumadin and resume oral Lasix from home. ? ?Hypertension ?Resume metoprolol on discharge.  Blood pressure remained stable during hospitalization. ? ?Malignant neoplasm of upper-outer quadrant of left breast in female, estrogen receptor positive ([place]) ?In remission.  On anastrozole. ? ?Hypothyroidism ?Continue Synthroid. ? ?S/P reverse total shoulder arthroplasty, right ?Continue supportive care.  Off sling recently. ? ?Chronic pain ?On Flexeril, gabapentin, PRN Percocet. ? ?AKI (acute kidney injury) ([place]) ?Mild.  Improved at this time.  Creatinine of 0.7 at this time ? ?Generalized weakness ?Debility, deconditioning/ambulatory dysfunction at home though she was recently admitted and discharged from a rehabilitation facility.  Family unable to take care of her at home.  Physical therapy has seen the patient and recommend skilled nursing facility for rehabilitation at this  time.. ? ? ?Consultants:  ?Cardiology ?Neurology ? ?Procedures performed: None ? ?Disposition: Skilled nursing facility ?Diet recommendation:  ?Discharge Diet Orders (From admission, onward)  ? ?  Start     Ordered  ? 12/01/21 0000  Diet - low sodium heart healthy       ? 12/01/21 1331  ? ?  ?  ? ?  ? ?Cardiac diet ?DISCHARGE MEDICATION: ?Allergies as of 12/01/2021   ? ?   Reactions  ? Nsaids Nausea And Vomiting, Other (See Comments)  ? GI Upset (ibuprofen included)  ? Ibuprofen Other (See Comments)  ? Other reaction(s): Abdominal Pain  ? Tolmetin Other (See Comments)  ? Other reaction(s): Abdominal Pain  ? ?  ? ?  ?Medication List  ?  ? ?STOP taking these medications   ? ?aspirin 81 MG EC tablet ?  ? ?  ? ?TAKE these medications   ? ?acetaminophen 325 MG tablet ?Commonly known as: TYLENOL ?Take 2 tablets (650 mg total) by mouth every 6 (six) hours as needed for mild pain or moderate pain. ?  ?amiodarone 200 MG tablet ?Commonly known as: PACERONE ?Take 1 tablet (200 mg total) by mouth daily. ?  ?anastrozole 1 MG tablet ?Commonly known as: ARIMIDEX ?TAKE 1 TABLET BY MOUTH  DAILY ?What changed:  ?how much to take ?how to take this ?when to take this ?additional instructions ?  ?B COMPLEX 100 PO ?Take 1 tablet by mouth daily. ?  ?Biotin 10000 MCG Tabs ?Take 10,000 mcg by mouth daily. ?  ?buPROPion 300 MG 24 hr tablet ?Commonly known as: WELLBUTRIN XL ?Take 300 mg by mouth daily. ?  ?CALCIUM 600 PO ?Take 600 mg by mouth in the morning and at bedtime. ?  ?carbamazepine 200 MG 12 hr tablet ?Commonly known as: TEGRETOL XR ?Take 400 mg by mouth 2 (two) times daily. ?  ?cholecalciferol 10 MCG (400 UNIT) Tabs tablet ?Commonly known as: VITAMIN D3 ?Take 400 Units by mouth daily. ?  ?clopidogrel 75 MG tablet ?Commonly known as: PLAVIX ?Take 1 tablet (75 mg total) by mouth daily. Do not stop unless directed by cardiology or cardiothoracic surgery. ?  ?cyclobenzaprine 10 MG tablet ?Commonly known as: FLEXERIL ?Take 1 tablet (10 mg  total) by mouth 3 (three) times daily as needed for muscle spasms. ?  ?DULoxetine 30 MG capsule ?Commonly known as: CYMBALTA ?Take 30 mg by mouth 2 (two) times daily. ?  ?enoxaparin 120 MG/0.8ML injection ?Commonly known as: LOVENOX ?Inject 0.74 mLs (110 mg total) into the skin every 12 (twelve) hours for 3 days. Until INR between 2-3 ?  ?ferrous sulfate 325 (65 FE) MG EC tablet ?Take 325 mg by mouth daily with breakfast. ?  ?furosemide 40 MG tablet ?Commonly known as: LASIX ?Take 1 tablet (40 mg total) by mouth daily. ?  ?gabapentin 100 MG capsule ?Commonly known as: NEURONTIN ?Take 2 capsules (200 mg total) by mouth 3 (three) times daily. ?  ?latanoprost 0.005 % ophthalmic solution ?Commonly known as: XALATAN ?Place 1 drop into both eyes at bedtime. ?  ?levothyroxine 88 MCG tablet ?Commonly known as: SYNTHROID ?Take 88 mcg by mouth daily. ?  ?liothyronine 25 MCG tablet ?Commonly known as: CYTOMEL ?  Take 25 mcg by mouth daily. ?  ?metoprolol succinate 25 MG 24 hr tablet ?Commonly known as: TOPROL-XL ?Take 0.5 tablets (12.5 mg total) by mouth daily. ?What changed: how much to take ?  ?MULTIVITAMIN/IRON PO ?Take 1 tablet by mouth daily. ?  ?mupirocin ointment 2 % ?Commonly known as: BACTROBAN ?Apply 1 application topically 3 (three) times daily as needed for rash or irritation. ?  ?ondansetron 4 MG tablet ?Commonly known as: Zofran ?Take 1 tablet (4 mg total) by mouth every 8 (eight) hours as needed for nausea or vomiting. ?  ?oxyCODONE-acetaminophen 10-325 MG tablet ?Commonly known as: PERCOCET ?Take 1 tablet by mouth every 8 (eight) hours as needed for pain. ?What changed:  ?when to take this ?reasons to take this ?  ?pantoprazole 40 MG tablet ?Commonly known as: PROTONIX ?Take 1 tablet (40 mg total) by mouth daily. ?  ?polyethylene glycol 17 g packet ?Commonly known as: MIRALAX / GLYCOLAX ?Take 17 g by mouth 2 (two) times daily. ?What changed: when to take this ?  ?potassium chloride SA 20 MEQ tablet ?Commonly  known as: KLOR-CON M ?Take 1 tablet (20 mEq total) by mouth daily. ?  ?QUEtiapine 100 MG tablet ?Commonly known as: SEROQUEL ?Take 2 tablets (200 mg total) by mouth at bedtime. ?  ?rosuvastatin 10 MG tablet ?Commonly k

## 2021-12-01 NOTE — Care Management Important Message (Signed)
Important Message ? ?Patient Details  ?Name: Deborah Cooke ?MRN: [id number] ?Date of Birth: 1951-02-09 ? ? ?Medicare Important Message Given:  Yes ? ? ? ? ?[name] ?12/01/2021, 9:45 AM ?

## 2021-12-01 NOTE — Plan of Care (Signed)

## 2021-12-01 NOTE — TOC Progression Note (Addendum)
Transition of Care (TOC) - Progression Note  ? ? ?Patient Details  ?Name: JEANNY RYMER ?MRN: [id number] ?Date of Birth: [birth year] ? ?Transition of Care (TOC) CM/SW Contact  ?Milas Gain, LCSWA ?Phone Number: ?12/01/2021, 11:39 AM ? ?Clinical Narrative:    ? ?[employer] authorization has been approved. Plan auth ID# [id number]. [place] ID# [id number]. Patient has SNF bed at [facility]. Soy with [facility] confirmed patient can dc over today if medically ready. CSW informed MD. ? ?Patients insurance authorization is currently pending. Patient has SNF bed at [facility]. CSW will continue to follow and assist with patients dc planning needs. ? ?Expected Discharge Plan: [place] ?Barriers to Discharge: Continued Medical Work up, SNF Pending bed offer, Insurance Authorization ? ?Expected Discharge Plan and Services ?Expected Discharge Plan: [place] ?  ?  ?  ?Living arrangements for the past 2 months: [place] ?                ?  ?  ?  ?  ?  ?  ?  ?  ?  ?  ? ? ?Social Determinants of Health (SDOH) Interventions ?  ? ?Readmission Risk Interventions ?No flowsheet data found. ? ?

## 2021-12-01 NOTE — Progress Notes (Signed)
Report called out to RN, Karenann Cai. ?

## 2021-12-01 NOTE — TOC Transition Note (Addendum)
Transition of Care (TOC) - CM/SW Discharge Note ? ? ?Patient Details  ?Name: Deborah Cooke ?MRN: [id number] ?Date of Birth: May 15, 1951 ? ?Transition of Care (TOC) CM/SW Contact:  ?Milas Gain, LCSWA ?Phone Number: ?12/01/2021, 2:16 PM ? ? ?Clinical Narrative:    ? ?Patient will DC to: [facility] ? ?Anticipated DC date: 12/01/2021 ? ?Family notified: Remo Lipps ? ?Transport by: Corey Harold ? ?? ? ?Per MD patient ready for DC to [facility] . RN, patient, patient's family, and facility notified of DC. Discharge Summary sent to facility. RN given number for report [phone] [id number]. DC packet on chart. Ambulance transport requested for patient. ? ?CSW signing off.  ? ?Final next level of care: [place] ?Barriers to Discharge: No Barriers Identified ? ? ?Patient Goals and CMS Choice ?Patient states their goals for this hospitalization and ongoing recovery are:: SNF ?CMS Medicare.gov Compare Post Acute Care list provided to:: Patient ?Choice offered to / list presented to : Patient ? ?Discharge Placement ?  ?           ?[facility] ?Patient to be transferred to facility by: [facility] ?Name of family member notified: Remo Lipps ?Patient and family notified of of transfer: 12/01/21 ? ?Discharge Plan and Services ?  ?  ?           ?  ?  ?  ?  ?  ?  ?  ?  ?  ?  ? ?Social Determinants of Health (SDOH) Interventions ?  ? ? ?Readmission Risk Interventions ?No flowsheet data found. ? ? ? ? ?

## 2021-12-01 NOTE — Progress Notes (Signed)
? ?Progress Note ? ?Patient Name: Deborah Cooke ?Date of Encounter: 12/01/2021 ? ?Primary Cardiologist:   Larae Grooms, MD ? ? ?Subjective  ? ?She is breathing OK and ready to go to rehab when this happens.  She denies pain or acute SOB.  ? ?Inpatient Medications  ?  ?Scheduled Meds: ? amiodarone  200 mg Oral Daily  ? anastrozole  1 mg Oral Daily  ? buPROPion  300 mg Oral Daily  ? carbamazepine  400 mg Oral BID  ? cholecalciferol  400 Units Oral Daily  ? clopidogrel  75 mg Oral Daily  ? DULoxetine  30 mg Oral BID  ? enoxaparin (LOVENOX) injection  110 mg Subcutaneous Q12H  ? ferrous sulfate  325 mg Oral Q breakfast  ? gabapentin  200 mg Oral TID  ? latanoprost  1 drop Both Eyes QHS  ? levothyroxine  88 mcg Oral Daily  ? liothyronine  25 mcg Oral Daily  ? metoprolol succinate  12.5 mg Oral Daily  ? potassium chloride SA  20 mEq Oral Daily  ? QUEtiapine  200 mg Oral QHS  ? rosuvastatin  10 mg Oral Daily  ? vortioxetine HBr  20 mg Oral Daily  ? Warfarin - Pharmacist Dosing Inpatient   Does not apply [id number]  ? ?Continuous Infusions: ? ?PRN Meds: ?cyclobenzaprine, oxyCODONE-acetaminophen, simethicone  ? ?Vital Signs  ?  ?Vitals:  ? 11/30/21 1239 11/30/21 1318 11/30/21 2127 12/01/21 0530  ?BP: 118/79 118/74 136/86 103/65  ?Pulse: 84 90 85 87  ?Resp: '17 16  16  '$ ?Temp: 98.7 ?F (37.1 ?C) 98.8 ?F (37.1 ?C) 98.5 ?F (36.9 ?C) 98.3 ?F (36.8 ?C)  ?TempSrc: Oral Oral Oral Oral  ?SpO2: 100% 100% 98% 94%  ?Weight:    110.6 kg  ?Height:      ? ? ?Intake/Output Summary (Last 24 hours) at 12/01/2021 0710 ?Last data filed at 11/30/2021 2130 ?Gross per 24 hour  ?Intake 660 ml  ?Output 1675 ml  ?Net -1015 ml  ? ?Filed Weights  ? 11/28/21 0535 11/29/21 0605 12/01/21 0530  ?Weight: 111.7 kg 112.1 kg 110.6 kg  ? ? ?Telemetry  ?  ?NSR - Personally Reviewed ? ?ECG  ?  ?NA - Personally Reviewed ? ?Physical Exam  ? ?GEN: No  acute distress.   ?Neck: Positive   JVD ?Cardiac: RRR, no murmurs, rubs, or gallops.  ?Respiratory: Clear   to  auscultation bilaterally. ?GI: Soft, nontender, non-distended, normal bowel sounds  ?MS:  Moderate leg edema; No deformity. ?Neuro:   Nonfocal  ?Psych: Oriented and appropriate  ? ? ? ?Labs  ?  ?Chemistry ?Recent Labs  ?Lab 11/26/21 ?1815 11/27/21 ?6045 11/28/21 ?4098 11/29/21 ?0102 11/30/21 ?0153 12/01/21 ?0256  ?NA  --  137   < > 138 138 137  ?K  --  3.9   < > 4.3 4.5 4.1  ?CL  --  101   < > 104 103 101  ?CO2  --  28   < > '26 29 28  '$ ?GLUCOSE  --  94   < > 96 100* 95  ?BUN  --  24*   < > '21 21 20  '$ ?CREATININE  --  1.14*   < > 0.65 0.70 0.74  ?CALCIUM  --  8.1*   < > 8.3* 8.6* 8.6*  ?PROT 6.1* 5.0*  --   --   --   --   ?ALBUMIN 2.8* 2.4*  --   --   --   --   ?  AST 17 14*  --   --   --   --   ?ALT 14 11  --   --   --   --   ?ALKPHOS 139* 109  --   --   --   --   ?BILITOT 0.5 0.4  --   --   --   --   ?GFRNONAA  --  51*   < > >60 >60 >60  ?ANIONGAP  --  8   < > '8 6 8  '$ ? < > = values in this interval not displayed.  ?  ? ?Hematology ?Recent Labs  ?Lab 11/29/21 ?0102 11/30/21 ?0153 12/01/21 ?0256  ?WBC 5.1 5.6 5.2  ?RBC 2.88* 3.02* 2.89*  ?HGB 8.9* 9.4* 9.1*  ?HCT 28.6* 29.5* 28.0*  ?MCV 99.3 97.7 96.9  ?MCH 30.9 31.1 31.5  ?MCHC 31.1 31.9 32.5  ?RDW 15.8* 15.7* 15.7*  ?PLT 315 369 337  ? ? ?Cardiac EnzymesNo results for input(s): TROPONINI in the last 168 hours. No results for input(s): TROPIPOC in the last 168 hours.  ? ?BNP ?Recent Labs  ?Lab 11/26/21 ?1119  ?BNP 482.4*  ?  ? ?DDimer No results for input(s): DDIMER in the last 168 hours.  ? ?Radiology  ?  ?No results found. ? ?Cardiac Studies  ? ?CTA:  11/26/2021 ? ?IMPRESSION: ?1. Small segmental acute pulmonary emboli in the right upper lobe ?and right lower lobe pulmonary artery. ?2. Prominent main pulmonary artery at 4.5 cm in transverse diameter ?indicating pulmonary arterial hypertension. Underlying cardiomegaly ?and coronary atherosclerosis noted. ?3. Recent CABG with healing median sternotomy and continued edema ?adjacent to the sternotomy site and anterior  mediastinum. Small ?pericardial effusion. ?4. Mild mosaic attenuation in the lungs, potentially reflecting a ?component of mild edema or bronchiolitis obliterans. ?5. Subacute fractures of the right anterior second through seventh ?ribs. Likely older fractures of the left anterior third through ?seventh ribs. ?6. Other imaging findings of potential clinical significance: Trace ?left pleural effusion. Radiation therapy related findings anteriorly ?in the left lung. Prior gastric bypass. Mitral valve and aortic ?valve calcification. ?  ?Patient Profile  ?   ?71 y.o. female with PMH STEMI 10/26/21, CHB, now s/p CABG. Presenting with shortness of breath and PE. ? ?Assessment & Plan  ?  ?Acute subsegmental PE:    Transitioned to warfarin (medication interaction between DOAC and migraine med; patient prefers to stay on current migraine med).  We will arrange follow up of her warfarin.  ?  ?Concern for pulmonary hypertension:      Net negative at lease 1 liters.   I/O incomplete.  Given 40 mg of IV Lasix x 1 yesterday.  Creat and BP tolerated.  I will start PO Lasix today.  She might go to rehab.  MAR reviewed.    We will arrange follow [facility] with [facility].  She has a follow up with Dr. Prescott Gum ?  ?Hypotension:  As above.  ? ?Recent STEMI, s/p CABG 4v:   Going home on warfarin and Plavix.  ?  ?Post op paroxysmal atrial fibrillation:    Maintaining NSR on oral amio. No change in therapy.  ? ?LE Edema:    Manage as above.   ? ?For questions or updates, please contact [place] ?Please consult [email or web address] for contact info under Cardiology/STEMI. ?  ?Signed, ?Minus Breeding, MD  ?12/01/2021, 7:10 AM   ? ?

## 2021-12-01 NOTE — Progress Notes (Signed)
Pt transported to [facility] via [facility].  Jodell Cipro ? ?

## 2021-12-01 NOTE — Progress Notes (Addendum)
Per Dr. Rosezella [place] request, cardiology TOC f/u arranged 3/28. Since pt will go to SNF, no call per office protocol at this time. Regarding Coumadin, I spoke with our Coumadin clinic team at [facility] who indicated they are familiar with [facility] and the SNF will manage the INR while the patient is under their care, and then our Coumadin clinic can pick her up after she's discharge from that facility. I will notify IM to please make recommendation in their Discharge Summary to have INR checked on Thurs or Fri per pharmD recommendation (spoke with Erin Hearing). I also included this in the AVS Discharge instruction. Candance H with our Coumadin team will call the facility later this week to ensure they planned to recheck INR in accordance with this recommendation. ?

## 2021-12-02 ENCOUNTER — Other Ambulatory Visit: Payer: Self-pay | Admitting: Cardiothoracic Surgery

## 2021-12-02 DIAGNOSIS — Z951 Presence of aortocoronary bypass graft: Secondary | ICD-10-CM

## 2021-12-04 ENCOUNTER — Telehealth: Payer: Self-pay | Admitting: *Deleted

## 2021-12-04 NOTE — Telephone Encounter (Signed)
[place] to ensure that the pt INR is being monitored and Warfarin being dosed accordingly. Left a message for them to call back at 1241pm. ? ?At 441pm, called back and requested to speak with Director of [place], and she stated the pt's INR and Warfarin is being followed and managed there. She stated there are no plans for any discharge, advised that when there she is, to call us to let us know so we can get her into our outpatient Anticoagulation Clinic. Also, gave her the number to call us to update [place] and she confirmed all the information. Will await for an update.  ? ?Pt is a Dr. Irish Lack pt and has an appt pending with Richardson Dopp on 12/15/21, dx-afib post op-on amio, dvt-bil venous, d/c to [facility] on 11/26/2021-12/01/2021 ?

## 2021-12-07 ENCOUNTER — Ambulatory Visit
Admission: RE | Admit: 2021-12-07 | Discharge: 2021-12-07 | Disposition: A | Discharge status: Home / Self Care | Payer: Medicare Other | Source: Ambulatory Visit | Attending: Cardiothoracic Surgery | Admitting: Cardiothoracic Surgery

## 2021-12-07 ENCOUNTER — Other Ambulatory Visit: Payer: Self-pay

## 2021-12-07 ENCOUNTER — Encounter: Payer: Self-pay | Admitting: Cardiothoracic Surgery

## 2021-12-07 ENCOUNTER — Ambulatory Visit (INDEPENDENT_AMBULATORY_CARE_PROVIDER_SITE_OTHER): Payer: Self-pay | Admitting: Cardiothoracic Surgery

## 2021-12-07 VITALS — BP 138/93 | HR 86 | Resp 20 | Ht 59.0 in | Wt 243.0 lb

## 2021-12-07 DIAGNOSIS — I2510 Atherosclerotic heart disease of native coronary artery without angina pectoris: Secondary | ICD-10-CM | POA: Insufficient documentation

## 2021-12-07 DIAGNOSIS — Z951 Presence of aortocoronary bypass graft: Secondary | ICD-10-CM

## 2021-12-07 NOTE — Progress Notes (Signed)
?HPI: ? ?Patient returns for routine postoperative follow-up having undergone emergency CABG x4 February 6 following non-STEMI with preoperative balloon pump and pacing wire. ?The patient's early postoperative recovery while in the hospital was notable for prolonged ICU stay secondary to her poor mobility from recent shoulder replacement and obesity.  The patient was discharged from the hospital to skilled nursing facility for rehab then to home.  However after 3 days at home she developed severe shortness of breath and was readmitted with bilateral small pulmonary emboli.  Her symptoms improved with anticoagulation and she is now back at a skilled nursing facility getting rehab.  She is breathing comfortably on room air.  She is taking Coumadin and Plavix but no aspirin. ?He is maintaining sinus rhythm now 6 weeks after surgery.  She is having some GI upset so we will stop the amiodarone which she is still taking.  This will affect her Coumadin but they are checking it at least twice a week. ? ?Chest x-ray today is personally reviewed showing clear lung fields.  Sternal wires are intact. ? ?She denies angina or bleeding problems from the Coumadin and Plavix.  She is getting stronger and is walking at least twice a day at rehab.  She feels like she will stay at rehab another week. ? ?Current Outpatient Medications  ?Medication Sig Dispense Refill  ? acetaminophen (TYLENOL) 325 MG tablet Take 2 tablets (650 mg total) by mouth every 6 (six) hours as needed for mild pain or moderate pain.    ? amiodarone (PACERONE) 200 MG tablet Take 1 tablet (200 mg total) by mouth daily.    ? anastrozole (ARIMIDEX) 1 MG tablet TAKE 1 TABLET BY MOUTH  DAILY (Patient taking differently: Take 1 mg by mouth daily.) 90 tablet 3  ? B Complex Vitamins (B COMPLEX 100 PO) Take 1 tablet by mouth daily.    ? Biotin 10000 MCG TABS Take 10,000 mcg by mouth daily.    ? buPROPion (WELLBUTRIN XL) 300 MG 24 hr tablet Take 300 mg by mouth daily.    ?  Calcium Carbonate (CALCIUM 600 PO) Take 600 mg by mouth in the morning and at bedtime.    ? carbamazepine (TEGRETOL XR) 200 MG 12 hr tablet Take 400 mg by mouth 2 (two) times daily.    ? cholecalciferol (VITAMIN D) 400 units TABS tablet Take 400 Units by mouth daily.    ? clopidogrel (PLAVIX) 75 MG tablet Take 1 tablet (75 mg total) by mouth daily. Do not stop unless directed by cardiology or cardiothoracic surgery.    ? cyclobenzaprine (FLEXERIL) 10 MG tablet Take 1 tablet (10 mg total) by mouth 3 (three) times daily as needed for muscle spasms. 21 tablet 0  ? DULoxetine (CYMBALTA) 30 MG capsule Take 30 mg by mouth 2 (two) times daily.    ? ferrous sulfate 325 (65 FE) MG EC tablet Take 325 mg by mouth daily with breakfast.    ? furosemide (LASIX) 40 MG tablet Take 1 tablet (40 mg total) by mouth daily.    ? gabapentin (NEURONTIN) 100 MG capsule Take 2 capsules (200 mg total) by mouth 3 (three) times daily.    ? latanoprost (XALATAN) 0.005 % ophthalmic solution Place 1 drop into both eyes at bedtime.    ? levothyroxine (SYNTHROID) 88 MCG tablet Take 88 mcg by mouth daily.    ? liothyronine (CYTOMEL) 25 MCG tablet Take 25 mcg by mouth daily.    ? metoprolol succinate (TOPROL-XL) 25 MG 24  hr tablet Take 0.5 tablets (12.5 mg total) by mouth daily.    ? Multiple Vitamins-Iron (MULTIVITAMIN/IRON PO) Take 1 tablet by mouth daily.     ? mupirocin ointment (BACTROBAN) 2 % Apply 1 application topically 3 (three) times daily as needed for rash or irritation.    ? ondansetron (ZOFRAN) 4 MG tablet Take 1 tablet (4 mg total) by mouth every 8 (eight) hours as needed for nausea or vomiting. 10 tablet 0  ? oxyCODONE-acetaminophen (PERCOCET) 10-325 MG tablet Take 1 tablet by mouth every 8 (eight) hours as needed for pain. 10 tablet 0  ? pantoprazole (PROTONIX) 40 MG tablet Take 1 tablet (40 mg total) by mouth daily.    ? polyethylene glycol (MIRALAX / GLYCOLAX) 17 g packet Take 17 g by mouth 2 (two) times daily. (Patient taking  differently: Take 17 g by mouth daily.) 14 each 0  ? potassium chloride SA (KLOR-CON M) 20 MEQ tablet Take 1 tablet (20 mEq total) by mouth daily.    ? QUEtiapine (SEROQUEL) 100 MG tablet Take 2 tablets (200 mg total) by mouth at bedtime.    ? rosuvastatin (CRESTOR) 10 MG tablet Take 1 tablet (10 mg total) by mouth daily.    ? simethicone (MYLICON) 80 MG chewable tablet Chew 80 mg by mouth every 6 (six) hours as needed for flatulence.    ? TRINTELLIX 20 MG TABS tablet Take 20 mg by mouth daily.    ? warfarin (COUMADIN) 2.5 MG tablet Take 2 tablets (5 mg total) by mouth daily at 4 PM. Adjust dose for INR of 2-3    ? enoxaparin (LOVENOX) 120 MG/0.8ML injection Inject 0.74 mLs (110 mg total) into the skin every 12 (twelve) hours for 3 days. Until INR between 2-3 0 mL   ? ?No current facility-administered medications for this visit.  ? ? ?Physical Exam ?Blood pressure (!) 138/93, pulse 86, resp. rate 20, height '4\' 11"'$  (1.499 m), weight 243 lb (110.2 kg), SpO2 97 %.  ?General-alert and oriented no complaints ?HEENT-normocephalic no adenopathy or JVD ?Chest-well-healed sternal incision.  Clear lung fields ?COR-regular rhythm without murmur or gallop ?Abdomen soft nontender ?Extremities-well-healed EVH incision ?Neuro-no focal weakness, clear speech ? ?Diagnostic Tests: ?Today's chest x-ray personally reviewed showing clear lung fields no pleural effusion cardiac silhouette stable sternal wires intact ? ?Impression: ?Now doing well almost 6 weeks after emergency CABG x4. ?She sustained a pulmonary embolus after discharge from her skilled nursing facility but now is doing well on anticoagulation therapy.  She is having GI upset and her amiodarone is probably involved involved with that so we will stop it as she has maintained sinus rhythm now for several weeks. ? ?Plan: Patient will need to continue Plavix indefinitely for treatment of the saphenous vein allografts.  She will need Coumadin for several months for the  postoperative PE. ?She is not ready to drive or lift more than 5 pounds.  I will see her back in about 3 weeks to review those issues and to make sure that she is progressing well. ? ?Dahlia Byes, MD ?[facility] ?[phone] ? ? ? ? ?

## 2021-12-14 ENCOUNTER — Encounter: Payer: Self-pay | Admitting: Physician Assistant

## 2021-12-14 DIAGNOSIS — E785 Hyperlipidemia, unspecified: Secondary | ICD-10-CM | POA: Insufficient documentation

## 2021-12-14 DIAGNOSIS — I2781 Cor pulmonale (chronic): Secondary | ICD-10-CM

## 2021-12-14 HISTORY — DX: Cor pulmonale (chronic): I27.81

## 2021-12-14 NOTE — Progress Notes (Addendum)
?Cardiology Office Note:   ? ?Date:  12/15/2021  ? ?ID:  Deborah Cooke, DOB [birth year], MRN [id number] ? ?PCP:  Rich Fuchs, PA  ?[facility] Providers ?Cardiologist:  Larae Grooms, MD     ?Referring MD: Rich Fuchs, PA  ? ?Chief Complaint:  Hospitalization Follow-up (S/p MI followed by CABG; Pulmonary embolism) ?  ? 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 ?Prior CV Studies: ?Echocardiogram 10/27/21 ?EF 60-65, no RWMA, Gr 2 DD, D shaped LV c/w RV vol overload, findings c/w acute cor pulmonale, mod reduced RVSF, mildly elevated PASP, RVSP 44, mod BAE, severe MAC, mean MV 3 mmHg, mild dilation of ascending aorta (39 mm) ? ?Cardiac catheterization 10/26/2021 ?Narrative ?  Prox RCA lesion is 75% stenosed. ?  Mid RCA to Dist RCA lesion is 75% stenosed with 75% stenosed side branch in RPDA.  Heavy thrombus. ?  Mid LM to [facility] LAD lesion is 80% stenosed. ?  The left ventricular systolic function is normal. ?  LV end diastolic pressure is moderately elevated. ?  The left ventricular ejection fraction is 55-65% by visual estimate. ?  There is no aortic valve stenosis. ?  ? ?History of Present Illness:   ?Deborah Cooke is a 71 y.o. female with the above problem list.  She was admitted 2/6-2/22 with inferior STEMI c/b CHB requiring temporary pacer.  She was not a candidate for PCI given LM disease and was felt to be high risk for CABG due to morbid obesity.  She ultimately underwent CABG with cryopreserved allograft due to poor vein access.   Her post op course was c/b AF which converted to NSR on Amiodarone, ileus and suspected pneumonia tx with antibiotics. ?     ?She was readmitted 3/9-3/14 with small segmental RUL and RLL pulmonary emboli.  [place] was neg for DVT. PE was felt to be provoked post op.  Pt was placed on IV heparin and transitioned to Coumadin (unable to use DOAC due to Rx with Carbamazepine).  Echocardiogram in Feb 2023 at the time of her MI demonstrated cor pulmonale and mild pulmonary hypertension and she had difficult to control LE edema felt to be due to R CHF.  She was diuresed with IV Lasix.  OP sleep study was suggested by the IP Cardiology team.  ? ?She saw Dr. Prescott Gum March 20 for follow-up.  She was having some GI upset and her amiodarone was discontinued. ? ?She returns for f/u.  She is here with her transportation aide from [place], [place]).  Overall, she feels she is doing well.  She believes that she will be going home tomorrow.  She is walking with a walker at the facility.  Her chest is still somewhat sore.  She still notes shortness of breath with activity.  She feels that this may be improving.  She sleeps on 1 pillow.  She has not had syncope.  She does note persistent lower extremity edema.  This remains worse than previous. ?   ?  Past Medical History:  ?Diagnosis Date  ? Adverse effect of anesthetic 01/2012  ? difficulty waking up.  ? Adverse effect of unspecified anesthetic, initial encounter 08/2011  ? low o2 sats.  ? Anemia   ? Anxiety   ? Arthritis   ? Breast cancer ([place]) 02/16/2017  ? left breast  ? Chronic cor pulmonale ([place]) 12/14/2021  ? Echocardiogram 10/27/21 EF 60-65, no RWMA, Gr 2 DD, D shaped LV c/w RV vol overload, findings c/w acute cor pulmonale, mod reduced RVSF, mildly elevated PASP, RVSP 44, mod BAE, severe MAC, mean MV 3 mmHg, mild dilation of ascending aorta (39 mm)  ? Chronic pain   ? Closed supracondylar fracture of right femur ([place]) 09/18/2012  ? Depression   ? Family history of  breast cancer   ? GERD (gastroesophageal reflux disease)   ? pt denies  ? Headache(784.0)   ? Hypertension   ? Hypothyroidism   ? Opioid dependence in controlled environment [facility]   ? Osteoporosis   ? Osteoporosis with fracture 09/19/2012  ? Pneumonia   ? Thyroid disease   ? ?Current Medications: ?Current Meds  ?Medication Sig  ? acetaminophen (TYLENOL) 325 MG tablet Take 2 tablets (650 mg total) by mouth every 6 (six) hours as needed for mild pain or moderate pain.  ? anastrozole (ARIMIDEX) 1 MG tablet TAKE 1 TABLET BY MOUTH  DAILY (Patient taking differently: Take 1 mg by mouth daily.)  ? B Complex Vitamins (B COMPLEX 100 PO) Take 1 tablet by mouth daily.  ? Biotin 10000 MCG TABS Take 10,000 mcg by mouth daily.  ? buPROPion (WELLBUTRIN XL) 300 MG 24 hr tablet Take 300 mg by mouth daily.  ? Calcium Carbonate (CALCIUM 600 PO) Take 600 mg by mouth in the morning and at bedtime.  ? carbamazepine (TEGRETOL XR) 200 MG 12 hr tablet Take 400 mg by mouth 2 (two) times daily.  ? cholecalciferol (VITAMIN D) 400 units TABS tablet Take 400 Units by mouth daily.  ? cyclobenzaprine (FLEXERIL) 10 MG tablet Take 1 tablet (10 mg total) by mouth 3 (three) times daily as needed for muscle spasms.  ? DULoxetine (CYMBALTA) 30 MG capsule Take 30 mg by mouth 2 (two) times daily.  ? ferrous sulfate 325 (65 FE) MG EC tablet Take 325 mg by mouth daily with breakfast.  ? gabapentin (NEURONTIN) 100 MG capsule Take 2 capsules (200 mg total) by mouth 3 (three) times daily.  ? latanoprost (XALATAN) 0.005 % ophthalmic solution Place 1 drop into both eyes at bedtime.  ? levothyroxine (SYNTHROID) 88 MCG tablet Take 88 mcg by mouth daily.  ? liothyronine (CYTOMEL) 25 MCG tablet Take 25 mcg by mouth daily.  ? Multiple Vitamins-Iron (MULTIVITAMIN/IRON PO) Take 1 tablet by mouth daily.   ? oxyCODONE-acetaminophen (PERCOCET) 10-325 MG tablet Take 1 tablet by mouth every 8 (eight) hours as needed for pain.  ? pantoprazole (PROTONIX) 40 MG tablet Take 1  tablet (40 mg total) by mouth daily.  ? polyethylene glycol (MIRALAX / GLYCOLAX) 17 g packet Take 17 g by mouth 2 (two) times daily. (Patient taking differently: Take 17 g by mouth daily.)  ? QUEtiapine (SEROQUEL) 100 MG tablet Take 2 tablets (200 mg total) by mouth at bedtime.  ? simethicone (MYLICON) 80 MG chewable tablet Chew 80 mg by mouth every 6 (six) hours as needed for flatulence.  ? torsemide (DEMADEX) 20 MG tablet Take 1 tablet (20 mg total) by mouth 2 (two) times daily.  ? TRINTELLIX 20 MG  TABS tablet Take 20 mg by mouth daily.  ? warfarin (COUMADIN) 2.5 MG tablet Take 2 tablets (5 mg total) by mouth daily at 4 PM. Adjust dose for INR of 2-3  ? [DISCONTINUED] amiodarone (PACERONE) 200 MG tablet Take 1 tablet (200 mg total) by mouth daily.  ? [DISCONTINUED] clopidogrel (PLAVIX) 75 MG tablet Take 1 tablet (75 mg total) by mouth daily. Do not stop unless directed by cardiology or cardiothoracic surgery.  ? [DISCONTINUED] furosemide (LASIX) 40 MG tablet Take 1 tablet (40 mg total) by mouth daily.  ? [DISCONTINUED] metoprolol succinate (TOPROL-XL) 25 MG 24 hr tablet Take 0.5 tablets (12.5 mg total) by mouth daily.  ? [DISCONTINUED] mupirocin ointment (BACTROBAN) 2 % Apply 1 application topically 3 (three) times daily as needed for rash or irritation.  ? [DISCONTINUED] ondansetron (ZOFRAN) 4 MG tablet Take 1 tablet (4 mg total) by mouth every 8 (eight) hours as needed for nausea or vomiting.  ? [DISCONTINUED] potassium chloride SA (KLOR-CON M) 20 MEQ tablet Take 1 tablet (20 mEq total) by mouth daily.  ? [DISCONTINUED] rosuvastatin (CRESTOR) 10 MG tablet Take 1 tablet (10 mg total) by mouth daily.  ?  ?Allergies:   Nsaids, Ibuprofen, and Tolmetin  ? ?Social History  ? ?Tobacco Use  ? Smoking status: Former  ?  Packs/day: 1.00  ?  Years: 28.00  ?  Pack years: 28.00  ?  Types: Cigarettes  ?  Quit date: 01/17/1989  ?  Years since quitting: 32.9  ? Smokeless tobacco: Former  ?  Quit date: 01/17/1989  ?Vaping Use  ?  Vaping Use: Never used  ?Substance Use Topics  ? Alcohol use: Yes  ?  Comment: rarely  glass of wine a month  ? Drug use: No  ?  ?Family Hx: ?The patient's family history includes Breast cancer (age of on

## 2021-12-15 ENCOUNTER — Ambulatory Visit: Payer: Medicare Other | Admitting: Physician Assistant

## 2021-12-15 ENCOUNTER — Encounter: Payer: Self-pay | Admitting: Physician Assistant

## 2021-12-15 ENCOUNTER — Telehealth: Payer: Self-pay | Admitting: *Deleted

## 2021-12-15 ENCOUNTER — Other Ambulatory Visit: Payer: Self-pay

## 2021-12-15 VITALS — BP 128/80 | HR 89 | Ht 59.0 in | Wt 234.0 lb

## 2021-12-15 DIAGNOSIS — E785 Hyperlipidemia, unspecified: Secondary | ICD-10-CM

## 2021-12-15 DIAGNOSIS — Z86711 Personal history of pulmonary embolism: Secondary | ICD-10-CM | POA: Diagnosis not present

## 2021-12-15 DIAGNOSIS — I2781 Cor pulmonale (chronic): Secondary | ICD-10-CM

## 2021-12-15 DIAGNOSIS — I2510 Atherosclerotic heart disease of native coronary artery without angina pectoris: Secondary | ICD-10-CM

## 2021-12-15 DIAGNOSIS — I4891 Unspecified atrial fibrillation: Secondary | ICD-10-CM | POA: Insufficient documentation

## 2021-12-15 DIAGNOSIS — I10 Essential (primary) hypertension: Secondary | ICD-10-CM

## 2021-12-15 DIAGNOSIS — I9789 Other postprocedural complications and disorders of the circulatory system, not elsewhere classified: Secondary | ICD-10-CM

## 2021-12-15 MED ORDER — TORSEMIDE 20 MG PO TABS: 20.0000 mg | ORAL_TABLET | Freq: Every day | ORAL | 3 refills | Status: DC

## 2021-12-15 MED ORDER — TORSEMIDE 20 MG PO TABS: 20.0000 mg | ORAL_TABLET | Freq: Two times a day (BID) | ORAL | 3 refills | Status: DC

## 2021-12-15 MED ORDER — METOPROLOL SUCCINATE ER 25 MG PO TB24: 12.5000 mg | ORAL_TABLET | Freq: Every day | ORAL | 3 refills | Status: DC

## 2021-12-15 MED ORDER — ROSUVASTATIN CALCIUM 10 MG PO TABS: 10.0000 mg | ORAL_TABLET | Freq: Every day | ORAL | 3 refills | Status: DC

## 2021-12-15 MED ORDER — POTASSIUM CHLORIDE CRYS ER 20 MEQ PO TBCR
20.0000 meq | EXTENDED_RELEASE_TABLET | Freq: Every day | ORAL | 3 refills | Status: DC
Start: 2021-12-15 — End: 2023-01-27

## 2021-12-15 MED ORDER — CLOPIDOGREL BISULFATE 75 MG PO TABS: 75.0000 mg | ORAL_TABLET | Freq: Every day | ORAL | 3 refills | Status: DC

## 2021-12-15 NOTE — Telephone Encounter (Signed)
Attempted to call [facility], with verbal orders to check Point Of Care PT/INR and call results to Coumadin Clinic [phone].  Had to leave message for call back. ?

## 2021-12-15 NOTE — Assessment & Plan Note (Signed)
Blood pressure is well controlled on current therapy.  Continue metoprolol succinate 12.5 mg daily. ?

## 2021-12-15 NOTE — Assessment & Plan Note (Signed)
Maintaining sinus rhythm.  She is now off of amiodarone. ?

## 2021-12-15 NOTE — Assessment & Plan Note (Signed)
She has evidence of right-sided heart failure.  She has continued right lower extremity edema.  Once she is recovered from her surgery, we will need to arrange outpatient sleep study.  She continues with significant lower extremity edema.  I suspect she will do better on a diuretic like torsemide.  She is agreeable to make the switch. ?? DC Lasix ?? Start torsemide 20 mg daily ?? Continue K+ 20 mg once daily ?? BMET 1 week ?? Follow-up 3 to 4 weeks ?? Plan ordering sleep study at next visit ?

## 2021-12-15 NOTE — Patient Instructions (Addendum)
Medication Instructions:  ?Your physician has recommended you make the following change in your medication:  ? STOP Lasix ? START Torsemide 20 mg taking  1 daily  ? ?*If you need a refill on your cardiac medications before your next appointment, please call your pharmacy* ? ? ?Lab Work: ?BMET IN 1 WEEK ? ?If you have labs (blood work) drawn today and your tests are completely normal, you will receive your results only by: ?MyChart Message (if you have MyChart) OR ?A paper copy in the mail ?If you have any lab test that is abnormal or we need to change your treatment, we will call you to review the results. ? ? ?Testing/Procedures: ?None ordered ? ? ?Follow-Up: ?At [facility], you and your health needs are our priority.  As part of our continuing mission to provide you with exceptional heart care, we have created designated Provider Care Teams.  These Care Teams include your primary Cardiologist (physician) and Advanced Practice Providers (APPs -  Physician Assistants and Nurse Practitioners) who all work together to provide you with the care you need, when you need it. ? ?We recommend signing up for the patient portal called "MyChart".  Sign up information is provided on this After Visit Summary.  MyChart is used to connect with patients for Virtual Visits (Telemedicine).  Patients are able to view lab/test results, encounter notes, upcoming appointments, etc.  Non-urgent messages can be sent to your provider as well.   ?To learn more about what you can do with MyChart, go to [email or web address].   ? ?Your next appointment:   ?3 week(s)  01/05/22 ARRIVE AT 2:30 ? ?The format for your next appointment:   ?In Person ? ?Provider:   ?Larae Grooms, MD  or Richardson Dopp, PA-C       ? ? ?Other Instructions ?Probiotic is ok for you to take  ? ?PLEASE HAVE THE FACILITY CALL us [phone], ASK FOR Shamel Germond W, TO LET us [facility] WILL FOLLOW YOU SO WE CAN ARRANGE SOME LAB WORK IN A WEEK  ?

## 2021-12-15 NOTE — Assessment & Plan Note (Signed)
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  She had cryopreserved allograft used for the vein graft to the PDA and vein graft to OM1/OM 2.  She will require long-term Plavix therapy.  She has been recuperating at skilled nursing facility and feels that she may be discharged tomorrow.  She seems to be progressing well.  She had follow-up recently with Dr. Prescott Gum.  She did have postoperative atrial fibrillation.  Her amiodarone was stopped.  She remains in sinus rhythm.  She will be discharged to home with home health nursing, PT, OT.  Once she is released from home health care, she can pursue outpatient cardiac rehab.  She is not on aspirin as she is on warfarin and Plavix. ?? Continue Plavix 75 mg daily, metoprolol succinate 12.5 mg daily, rosuvastatin 10 mg daily ?? Follow-up 3 to 4 weeks ?

## 2021-12-15 NOTE — Assessment & Plan Note (Signed)
Continue Crestor 10 mg daily.  Arrange fasting lipids and LFTs at next visit. ?

## 2021-12-15 NOTE — Telephone Encounter (Signed)
Placed call to [facility] to find out which Home Health will follow pt once she is discharged tomorrow, 12/16/21.  Per Reche Dixon will be the one. ? ?Placed call to [facility], [phone], had to leave a message for someone to call back so we can arrange the pt to have a BMET in 1 week. ?

## 2021-12-15 NOTE — Assessment & Plan Note (Signed)
She will need to remain on Coumadin for 3 to 6 months.  She will be discharged from a skilled nursing facility soon.  Her coumadin will be managed with home health.  ?

## 2021-12-16 NOTE — Telephone Encounter (Signed)
Aniceto Boss, RN, from [facility] facility called.  She has placed the BMET that we are needing in 1 week on pt's discharge paperwork for [facility] to draw, and I have provided our fax # for them to send the results over. ? ?Per Aniceto Boss, they did check PTINR today and it was 3, and she has put on discharge paperwork for them to repeat tomorrow and to communicate with our office. ? ?I will route to the Coumadin Clinic to make them aware. ?

## 2021-12-17 NOTE — Telephone Encounter (Addendum)
Spoke with pt and she stated that [facility] will be coming to the home tomorrow to obtain her labs. Dicussed some warfarin education-diet, new meds, and color specifics on the warfarin tablet. Pt confirmed she has been taking '3mg'$  of warfarin daily.  ? ?Vicky with [facility] called back and stated that the NURSE will be going out to the home tomorrow. She was given the number to call directly to report the INR and ensure that the BMET order was there as well. She could not confirm any orders.  ? ?Elizabeth Case Manager called back at 452pm and assessed orders to ensure it was there. She stated the orders had not been received so gave her a verbal order for a BMET to be done in 1 week from yesterday on 12/23/21 and an INR on tomorrow and call to [phone]. Confirmed orders and gave the main fax number of [phone] for the labs.  ?

## 2021-12-17 NOTE — Telephone Encounter (Signed)
Patient calling. She states the office needed to know what agency was coming to get her blood work and she says it is [facility].  ?

## 2021-12-18 ENCOUNTER — Ambulatory Visit (INDEPENDENT_AMBULATORY_CARE_PROVIDER_SITE_OTHER): Payer: Medicare Other | Admitting: *Deleted

## 2021-12-18 DIAGNOSIS — Z5181 Encounter for therapeutic drug level monitoring: Secondary | ICD-10-CM | POA: Diagnosis not present

## 2021-12-18 DIAGNOSIS — I4891 Unspecified atrial fibrillation: Secondary | ICD-10-CM

## 2021-12-18 LAB — POCT INR: INR: 2 (ref 2.0–3.0)

## 2021-12-18 NOTE — Patient Instructions (Addendum)
Description   ?Spoke with Estill Bamberg Nurse with [facility] advised to continue taking warfarin '3mg'$  daily. Recheck INR in 1 week (they are getting BMET the same day). Anticoagulation Clinic [phone] ?  ? ? A full discussion of the nature of anticoagulants has been carried out.  A benefit risk analysis has been presented to the patient, so that they understand the justification for choosing anticoagulation at this time. The need for frequent and regular monitoring, precise dosage adjustment and compliance is stressed.  Side effects of potential bleeding are discussed.  The patient should avoid any OTC items containing aspirin or ibuprofen, and should avoid great swings in general diet.  Avoid alcohol consumption.  Call if any signs of abnormal bleeding.  ? ?

## 2021-12-19 ENCOUNTER — Other Ambulatory Visit: Payer: Self-pay | Admitting: Physician Assistant

## 2021-12-19 ENCOUNTER — Telehealth: Payer: Self-pay | Admitting: Physician Assistant

## 2021-12-19 MED ORDER — WARFARIN SODIUM 3 MG PO TABS: 3.0000 mg | ORAL_TABLET | Freq: Every day | ORAL | 0 refills | Status: DC

## 2021-12-19 NOTE — Telephone Encounter (Signed)
? ?  The patient called the answering service after-hours today. She is in need of refill of her warfarin. History reviewed - she is on this for recent diagnosis of PE 11/2021 (prescribed by IM service) superimposed on a background hx of recent inferior STEMI/CABG in 10/2021. She is also on Plavix. She just got discharged from rehab. She had her INR checked yesterday by home health and it was 2.0. She was instructed by Coumadin clinic to continue '3mg'$  daily with INR check planned in 1 week. Patient confirms she had had '3mg'$  tablets at home when this was checked and has been taking 1 tablet daily. Will send in 30 day supply to [facility] in [place] per her request. Per Trinidad Curet note 12/15/21, the plan is for Coumadin for 3-6 months. ? ?Since she is on warfarin for PE rather than specific cardiac reason but we follow INR, will need to send to primary cardiologist to inquire whether he will take over further refills or recommend fills by primary care going forward. She has not yet seen her PCP post DC so certainly important to fill from our team acutely. I will send to Dr. Irish Lack and Coumadin clinic for review/advisement on further refills. ? ?The patient verbalized understanding and gratitude. ? ?Charlie Pitter, PA-C ? ?

## 2021-12-21 ENCOUNTER — Telehealth: Payer: Self-pay | Admitting: Physician Assistant

## 2021-12-21 NOTE — Telephone Encounter (Signed)
Called pt in regards to medication switch.  Pt would like to change from torsemide 20 mg PO QD back to furosemide.  Pt reports does not void with torsemide as much as did with furosemide. Medication change was made on 12/15/21 d/t increased LE swelling .  Pt report adequate PO intake.  Pt expresses that LE edema is not any worse than it was on furosemide.  Does not weigh daily advised pt to complete daily weights.  Last BP 101/64. Will send to [place], [place] to advise.  ?

## 2021-12-21 NOTE — Telephone Encounter (Signed)
Warfarin '3mg'$  refill sent on 12/19/2021 by Melina Copa. The pt was last seen by Richardson Dopp on 12/15/2021 and is on Warfarin for PE and had post op afib in the hospital; per last Scott's OV note: History of pulmonary embolism ?She will need to remain on Coumadin for 3 to 6 months.  ? ?Will not resend this as this is a duplicate and the pt cannot have a 90 day supply as she is new to the Clinic and has only had one INR check.  ?

## 2021-12-21 NOTE — Telephone Encounter (Signed)
Pt c/o medication issue: ? ?1. Name of Medication: torsemide (DEMADEX) 20 MG tablet ? ?2. How are you currently taking this medication (dosage and times per day)? As prescribed  ? ?3. Are you having a reaction (difficulty breathing--STAT)? No  ? ?4. What is your medication issue? Patient is wanting to stop taking torsemide and start back on lasix. States the torsemide isn't working.  ?

## 2021-12-21 NOTE — Telephone Encounter (Signed)
DC Torsemide ?Restart Furosemide 40 mg once daily  ?Richardson Dopp, PA-C    ?12/21/2021 9:43 PM   ?

## 2021-12-22 MED ORDER — FUROSEMIDE 40 MG PO TABS: 40.0000 mg | ORAL_TABLET | Freq: Every day | ORAL | 3 refills | Status: DC

## 2021-12-22 NOTE — Telephone Encounter (Signed)
Will route to triage to assist with sending in refills under Dr. Irish Lack ?

## 2021-12-22 NOTE — Telephone Encounter (Signed)
Pt advised and will restart the Lasix 40 mg daily per Scott's review.  ?

## 2021-12-23 ENCOUNTER — Telehealth: Payer: Self-pay | Admitting: *Deleted

## 2021-12-23 LAB — PROTIME-INR: INR: 1.36 — AB (ref <=1.20)

## 2021-12-23 NOTE — Telephone Encounter (Signed)
Patient's HH PT, [name], contacted the office requesting an update on patient's sternal precautions. Per Dr. Lucianne Lei Trigt's office visit note from 3/20, PT advised patient should not lift more than 5lbs. Advised patient has a follow up appt scheduled for tomorrow and restrictions may lift at that time. [name] verbalizes understanding.  ?

## 2021-12-24 ENCOUNTER — Ambulatory Visit (INDEPENDENT_AMBULATORY_CARE_PROVIDER_SITE_OTHER): Payer: Medicare Other

## 2021-12-24 ENCOUNTER — Ambulatory Visit: Payer: Self-pay | Admitting: Cardiothoracic Surgery

## 2021-12-24 DIAGNOSIS — I4891 Unspecified atrial fibrillation: Secondary | ICD-10-CM | POA: Diagnosis not present

## 2021-12-24 DIAGNOSIS — Z5181 Encounter for therapeutic drug level monitoring: Secondary | ICD-10-CM

## 2021-12-24 NOTE — Patient Instructions (Signed)
Description   ?Spoke with Alda Berthold Nurse with [facility] and pt, advised to START taking 1 tablet ('3mg'$ ) daily EXCEPT 1.5 tablets (4.'5mg'$ ) on Mondays and Thursdays.  ?Recheck INR in 1 week. ?Anticoagulation Clinic [phone] ?  ?   ?

## 2021-12-28 ENCOUNTER — Ambulatory Visit: Payer: Medicare Other | Admitting: Cardiothoracic Surgery

## 2021-12-30 ENCOUNTER — Emergency Department (HOSPITAL_COMMUNITY)
Admission: EM | Admit: 2021-12-30 | Discharge: 2021-12-30 | Disposition: A | Discharge status: Home / Self Care | Payer: Medicare Other | Source: Home / Self Care | Attending: Emergency Medicine | Admitting: Emergency Medicine

## 2021-12-30 ENCOUNTER — Emergency Department (HOSPITAL_COMMUNITY): Payer: Medicare Other

## 2021-12-30 ENCOUNTER — Ambulatory Visit (INDEPENDENT_AMBULATORY_CARE_PROVIDER_SITE_OTHER): Payer: Medicare Other

## 2021-12-30 ENCOUNTER — Other Ambulatory Visit: Payer: Self-pay

## 2021-12-30 DIAGNOSIS — I110 Hypertensive heart disease with heart failure: Secondary | ICD-10-CM | POA: Diagnosis not present

## 2021-12-30 DIAGNOSIS — Y9301 Activity, walking, marching and hiking: Secondary | ICD-10-CM | POA: Insufficient documentation

## 2021-12-30 DIAGNOSIS — S0990XA Unspecified injury of head, initial encounter: Secondary | ICD-10-CM | POA: Insufficient documentation

## 2021-12-30 DIAGNOSIS — W19XXXA Unspecified fall, initial encounter: Secondary | ICD-10-CM

## 2021-12-30 DIAGNOSIS — W01198A Fall on same level from slipping, tripping and stumbling with subsequent striking against other object, initial encounter: Secondary | ICD-10-CM | POA: Insufficient documentation

## 2021-12-30 DIAGNOSIS — Z7901 Long term (current) use of anticoagulants: Secondary | ICD-10-CM | POA: Insufficient documentation

## 2021-12-30 DIAGNOSIS — R0902 Hypoxemia: Secondary | ICD-10-CM | POA: Diagnosis not present

## 2021-12-30 DIAGNOSIS — Y92009 Unspecified place in unspecified non-institutional (private) residence as the place of occurrence of the external cause: Secondary | ICD-10-CM | POA: Insufficient documentation

## 2021-12-30 DIAGNOSIS — Z5181 Encounter for therapeutic drug level monitoring: Secondary | ICD-10-CM | POA: Diagnosis not present

## 2021-12-30 DIAGNOSIS — I4891 Unspecified atrial fibrillation: Secondary | ICD-10-CM | POA: Diagnosis not present

## 2021-12-30 LAB — BASIC METABOLIC PANEL WITH GFR
Anion gap: 10 (ref 5–15)
BUN: 22 mg/dL (ref 8–23)
CO2: 24 mmol/L (ref 22–32)
Calcium: 8.7 mg/dL — ABNORMAL LOW (ref 8.9–10.3)
Chloride: 103 mmol/L (ref 98–111)
Creatinine, Ser: 0.84 mg/dL (ref 0.44–1.00)
GFR, Estimated: >60 mL/min (ref >60)
Glucose, Bld: 94 mg/dL (ref 70–99)
Potassium: 4.9 mmol/L (ref 3.5–5.1)
Sodium: 137 mmol/L (ref 135–145)

## 2021-12-30 LAB — CBC WITH DIFFERENTIAL/PLATELET
Abs Immature Granulocytes: 0.04 K/uL (ref 0.00–0.07)
Basophils Absolute: 0.1 K/uL (ref 0.0–0.1)
Basophils Relative: 1 %
Eosinophils Absolute: 0.2 K/uL (ref 0.0–0.5)
Eosinophils Relative: 2 %
HCT: 36 % (ref 36.0–46.0)
Hemoglobin: 11.3 g/dL — ABNORMAL LOW (ref 12.0–15.0)
Immature Granulocytes: 1 %
Lymphocytes Relative: 20 %
Lymphs Abs: 1.3 K/uL (ref 0.7–4.0)
MCH: 30.5 pg (ref 26.0–34.0)
MCHC: 31.4 g/dL (ref 30.0–36.0)
MCV: 97 fL (ref 80.0–100.0)
Monocytes Absolute: 0.7 K/uL (ref 0.1–1.0)
Monocytes Relative: 11 %
Neutro Abs: 4.3 K/uL (ref 1.7–7.7)
Neutrophils Relative %: 65 %
Platelets: 310 K/uL (ref 150–400)
RBC: 3.71 MIL/uL — ABNORMAL LOW (ref 3.87–5.11)
RDW: 14.9 % (ref 11.5–15.5)
WBC: 6.6 K/uL (ref 4.0–10.5)
nRBC: 0 % (ref 0.0–0.2)

## 2021-12-30 LAB — PROTIME-INR
INR: 2.3 — ABNORMAL HIGH (ref 0.8–1.2)
Prothrombin Time: 25.1 seconds — ABNORMAL HIGH (ref 11.4–15.2)

## 2021-12-30 LAB — POCT INR: INR: 2.6 (ref 2.0–3.0)

## 2021-12-30 MED ORDER — WARFARIN SODIUM 3 MG PO TABS
ORAL_TABLET | ORAL | 1 refills | Status: DC
Start: 2021-12-30 — End: 2022-02-05

## 2021-12-30 NOTE — Patient Instructions (Signed)
Description   ?Spoke with Lattie Haw Nurse with [facility] and pt, advised to continue on same dosage of Warfarin 1 tablet ('3mg'$ ) daily EXCEPT 1.5 tablets (4.'5mg'$ ) on Mondays and Thursdays. Recheck INR in 1 week.  Anticoagulation Clinic [phone] ?  ?  ?

## 2021-12-30 NOTE — Progress Notes (Signed)
Orthopedic Tech Progress Note ?Patient Details:  ?Deborah Cooke ?[birth year] ?[id number] ? ?Level 2 trauma  ? ? ?Patient ID: [name], female   DOB: Nov 16, 1950, 71 y.o.   MRN: [id number] ? ?Janit Pagan ?12/30/2021, 4:55 PM ? ?

## 2021-12-30 NOTE — ED Notes (Signed)
Back from CT

## 2021-12-30 NOTE — ED Triage Notes (Signed)
Trauma level 2 activated. Via EMS. Pt witnessed fall from standing. Mechanical, denies LOC, no bleeding noted. Pt on blood thinners. Pt not keeping c collar on. Denies cervical pain at this time. GCS 15 ?

## 2021-12-30 NOTE — ED Notes (Signed)
L elbow gauze placed with tape, no active bleeding ? ?

## 2021-12-30 NOTE — ED Provider Notes (Signed)
?[facility] ?Provider Note ? ? ?CSN: [id number] ?Arrival date & time: 12/30/21  1624 ? ?  ? ?History ? ?Chief Complaint  ?Patient presents with  ? Fall  ? ? ?Deborah Cooke is a 71 y.o. female. ? ?Patient presents as a level 2 trauma after mechanical fall at home.  Is on Coumadin.  Patient states that her walker she was walking with but her sock was loose and she tripped up on the floor and hit her head.  Did not lose consciousness.  Does not have any extremity pain.  Denies any nausea or vomiting.  Nothing has made it better or worse.  She had her INR checked today. ? ? ? ?  ? ?Home Medications ?Prior to Admission medications   ?Medication Sig Start Date End Date Taking? Authorizing Provider  ?acetaminophen (TYLENOL) 325 MG tablet Take 2 tablets (650 mg total) by mouth every 6 (six) hours as needed for mild pain or moderate pain. 11/11/21   Antony Odea, PA-C  ?anastrozole (ARIMIDEX) 1 MG tablet TAKE 1 TABLET BY MOUTH  DAILY ?Patient taking differently: Take 1 mg by mouth daily. 08/21/21   Truitt Merle, MD  ?B Complex Vitamins (B COMPLEX 100 PO) Take 1 tablet by mouth daily.    [provider]  ?Biotin 10000 MCG TABS Take 10,000 mcg by mouth daily.    [provider]  ?buPROPion (WELLBUTRIN XL) 300 MG 24 hr tablet Take 300 mg by mouth daily. 06/09/21   [provider]  ?Calcium Carbonate (CALCIUM 600 PO) Take 600 mg by mouth in the morning and at bedtime.    [provider]  ?carbamazepine (TEGRETOL XR) 200 MG 12 hr tablet Take 400 mg by mouth 2 (two) times daily. 07/26/18 12/26/21  [provider]  ?cholecalciferol (VITAMIN D) 400 units TABS tablet Take 400 Units by mouth daily.    [provider]  ?clopidogrel (PLAVIX) 75 MG tablet Take 1 tablet (75 mg total) by mouth daily. Do not stop unless directed by cardiology or cardiothoracic surgery. 12/15/21   Richardson Dopp T, PA-C  ?cyclobenzaprine (FLEXERIL) 10 MG tablet Take 1  tablet (10 mg total) by mouth 3 (three) times daily as needed for muscle spasms. 10/17/20   Corinne Ports, PA-C  ?DULoxetine (CYMBALTA) 30 MG capsule Take 30 mg by mouth 2 (two) times daily.    [provider]  ?enoxaparin (LOVENOX) 120 MG/0.8ML injection Inject 0.74 mLs (110 mg total) into the skin every 12 (twelve) hours for 3 days. Until INR between 2-3 12/01/21 12/04/21  Pokhrel, Corrie Mckusick, MD  ?ferrous sulfate 325 (65 FE) MG EC tablet Take 325 mg by mouth daily with breakfast.    [provider]  ?furosemide (LASIX) 40 MG tablet Take 1 tablet (40 mg total) by mouth daily. 12/22/21   Richardson Dopp T, PA-C  ?gabapentin (NEURONTIN) 100 MG capsule Take 2 capsules (200 mg total) by mouth 3 (three) times daily. 11/11/21   Antony Odea, PA-C  ?latanoprost (XALATAN) 0.005 % ophthalmic solution Place 1 drop into both eyes at bedtime. 06/25/16   [provider]  ?levothyroxine (SYNTHROID) 88 MCG tablet Take 88 mcg by mouth daily.    [provider]  ?liothyronine (CYTOMEL) 25 MCG tablet Take 25 mcg by mouth daily. 10/28/21   [provider]  ?metoprolol succinate (TOPROL-XL) 25 MG 24 hr tablet Take 0.5 tablets (12.5 mg total) by mouth daily. 12/15/21   Richardson Dopp T, PA-C  ?Multiple  Vitamins-Iron (MULTIVITAMIN/IRON PO) Take 1 tablet by mouth daily.     [provider]  ?oxyCODONE-acetaminophen (PERCOCET) 10-325 MG tablet Take 1 tablet by mouth every 8 (eight) hours as needed for pain. 12/01/21   Pokhrel, Corrie Mckusick, MD  ?pantoprazole (PROTONIX) 40 MG tablet Take 1 tablet (40 mg total) by mouth daily. 11/12/21   Antony Odea, PA-C  ?polyethylene glycol (MIRALAX / GLYCOLAX) 17 g packet Take 17 g by mouth 2 (two) times daily. ?Patient taking differently: Take 17 g by mouth daily. 10/21/20   Patrecia Pour, MD  ?potassium chloride SA (KLOR-CON M) 20 MEQ tablet Take 1 tablet (20 mEq total) by mouth daily. 12/15/21   Richardson Dopp T, PA-C  ?QUEtiapine (SEROQUEL) 100 MG tablet  Take 2 tablets (200 mg total) by mouth at bedtime. 11/11/21   Antony Odea, PA-C  ?rosuvastatin (CRESTOR) 10 MG tablet Take 1 tablet (10 mg total) by mouth daily. 12/15/21   Richardson Dopp T, PA-C  ?simethicone (MYLICON) 80 MG chewable tablet Chew 80 mg by mouth every 6 (six) hours as needed for flatulence.    [provider]  ?TRINTELLIX 20 MG TABS tablet Take 20 mg by mouth daily. 11/24/21   [provider]  ?warfarin (COUMADIN) 3 MG tablet Take 1 to 1.5 tablets by mouth once daily as directed by anticoagulation clinic 12/30/21   Jettie Booze, MD  ?   ? ?Allergies    ?Nsaids, Ibuprofen, and Tolmetin   ? ?Review of Systems   ?Review of Systems ? ?Physical Exam ?Updated Vital Signs ?BP (!) 148/74   Pulse 92   Temp 97.8 ?F (36.6 ?C)   Resp 19   Ht '4\' 11"'$  (1.499 m)   Wt 106 kg   SpO2 96%   BMI 47.20 kg/m?  ?Physical Exam ?Vitals and nursing note reviewed.  ?Constitutional:   ?   General: She is not in acute distress. ?   Appearance: She is well-developed.  ?HENT:  ?   Head: Normocephalic and atraumatic.  ?   Nose: Nose normal.  ?   Mouth/Throat:  ?   Mouth: Mucous membranes are moist.  ?Eyes:  ?   Extraocular Movements: Extraocular movements intact.  ?   Conjunctiva/sclera: Conjunctivae normal.  ?   Pupils: Pupils are equal, round, and reactive to light.  ?Cardiovascular:  ?   Rate and Rhythm: Normal rate and regular rhythm.  ?   Pulses: Normal pulses.  ?   Heart sounds: Normal heart sounds. No murmur heard. ?Pulmonary:  ?   Effort: Pulmonary effort is normal. No respiratory distress.  ?   Breath sounds: Normal breath sounds.  ?Abdominal:  ?   Palpations: Abdomen is soft.  ?   Tenderness: There is no abdominal tenderness.  ?Musculoskeletal:     ?   General: No swelling or tenderness.  ?   Cervical back: Normal range of motion and neck supple. No tenderness.  ?Skin: ?   General: Skin is warm and dry.  ?   Capillary Refill: Capillary refill takes less than 2 seconds.  ?Neurological:  ?    General: No focal deficit present.  ?   Mental Status: She is alert and oriented to person, place, and time.  ?   Cranial Nerves: No cranial nerve deficit.  ?   Sensory: No sensory deficit.  ?   Motor: No weakness.  ?   Coordination: Coordination normal.  ?Psychiatric:     ?   Mood and Affect: Mood  normal.  ? ? ?ED Results / Procedures / Treatments   ?Labs ?(all labs ordered are listed, but only abnormal results are displayed) ?Labs Reviewed  ?CBC WITH DIFFERENTIAL/PLATELET - Abnormal; Notable for the following components:  ?    Result Value  ? RBC 3.71 (*)   ? Hemoglobin 11.3 (*)   ? All other components within normal limits  ?BASIC METABOLIC PANEL - Abnormal; Notable for the following components:  ? Calcium 8.7 (*)   ? All other components within normal limits  ?PROTIME-INR - Abnormal; Notable for the following components:  ? Prothrombin Time 25.1 (*)   ? INR 2.3 (*)   ? All other components within normal limits  ? ? ?EKG ?None ? ?Radiology ?CT HEAD WO CONTRAST (5MM) ? ?Result Date: 12/30/2021 ?CLINICAL DATA:  Head trauma, moderate-severe fall, on coumadin; Neck trauma (Age >= 65y) fall EXAM: CT HEAD WITHOUT CONTRAST CT CERVICAL SPINE WITHOUT CONTRAST TECHNIQUE: Multidetector CT imaging of the head and cervical spine was performed following the standard protocol without intravenous contrast. Multiplanar CT image reconstructions of the cervical spine were also generated. RADIATION DOSE REDUCTION: This exam was performed according to the departmental dose-optimization program which includes automated exposure control, adjustment of the mA and/or kV according to patient size and/or use of iterative reconstruction technique. COMPARISON:  CT head and CT cervical spine 03/13/2020. FINDINGS: CT HEAD FINDINGS Brain: No evidence of acute infarction, hemorrhage, hydrocephalus, extra-axial collection or mass lesion/mass effect. Similar mild for age chronic microvascular disease and atrophy. Vascular: No hyperdense vessel  identified. Calcific intracranial atherosclerosis. Skull: No acute fracture. Sinuses/Orbits: Clear sinuses.  No acute orbital findings. Other: No mastoid effusions. CT CERVICAL SPINE FINDINGS Alignment: Alignme

## 2021-12-30 NOTE — ED Notes (Signed)
Patient transported to CT with rn  ?

## 2021-12-30 NOTE — ED Notes (Signed)
Trauma Response Nurse Documentation ? ? ?Deborah Cooke is a 71 y.o. female arriving to [facility] ED via EMS ? ?On warfarin daily. Trauma was activated as a Level 2 by ED Charge RN based on the following trauma criteria Elderly patients > 65 with head trauma on anti-coagulation (excluding ASA). Trauma RN at the bedside on patient arrival. Patient cleared for CT by Dr. Ronnald Nian. Patient to CT with team. GCS 15. ? ?History  ? Past Medical History:  ?Diagnosis Date  ? Adverse effect of anesthetic 01/2012  ? difficulty waking up.  ? Adverse effect of unspecified anesthetic, initial encounter 08/2011  ? low o2 sats.  ? Anemia   ? Anxiety   ? Arthritis   ? Breast cancer ([place]) 02/16/2017  ? left breast  ? Chronic cor pulmonale ([place]) 12/14/2021  ? Echocardiogram 10/27/21 EF 60-65, no RWMA, Gr 2 DD, D shaped LV c/w RV vol overload, findings c/w acute cor pulmonale, mod reduced RVSF, mildly elevated PASP, RVSP 44, mod BAE, severe MAC, mean MV 3 mmHg, mild dilation of ascending aorta (39 mm)  ? Chronic pain   ? Closed supracondylar fracture of right femur ([place]) 09/18/2012  ? Depression   ? Family history of breast cancer   ? GERD (gastroesophageal reflux disease)   ? pt denies  ? Headache(784.0)   ? Hypertension   ? Hypothyroidism   ? Opioid dependence in controlled environment [facility])   ? Osteoporosis   ? Osteoporosis with fracture 09/19/2012  ? Pneumonia   ? Thyroid disease   ?  ? Past Surgical History:  ?Procedure Laterality Date  ?  OPEN REDUCTION INTERNAL FIXATION (ORIF) DISTAL FEMUR FRACTURE (Left Leg Upper)  10/15/2020  ? BACK SURGERY    ? fusion mid back, lumbar and thoracic  ? BREAST BIOPSY Left 02/16/2017  ? BREAST LUMPECTOMY WITH RADIOACTIVE SEED AND SENTINEL LYMPH NODE BIOPSY Left 03/15/2017  ? Procedure: LEFT BREAST LUMPECTOMY WITH RADIOACTIVE SEED AND LEFT AXILLARY SENTINEL LYMPH NODE BIOPSY;  Surgeon: [name] Overall, MD;  Location: [place];  Service: General;  Laterality: Left;  ? CHOLECYSTECTOMY   10/1975  ? CORONARY ARTERY BYPASS GRAFT N/A 10/27/2021  ? Procedure: CORONARY ARTERY BYPASS GRAFTING (CABG) TIMES FOUR, USING LEFT INTERNAL MAMMARY ARTERY AND RIGHT LEG GREATER SAPHENOUS VEIN HARVESTED ENDOSCOPICALLY AND RIGHT CORONARY THROMBECTOMY;  Surgeon: Dahlia Byes, MD;  Location: [place];  Service: Open Heart Surgery;  Laterality: N/A;  ? CORONARY/GRAFT ACUTE MI REVASCULARIZATION N/A 10/26/2021  ? Procedure: Coronary/Graft Acute MI Revascularization;  Surgeon: Jettie Booze, MD;  Location: [place] [facility];  Service: Cardiovascular;  Laterality: N/A;  ? ENDOVEIN HARVEST OF GREATER SAPHENOUS VEIN Right 10/27/2021  ? Procedure: ENDOVEIN HARVEST OF GREATER SAPHENOUS VEIN;  Surgeon: Dahlia Byes, MD;  Location: [place];  Service: Open Heart Surgery;  Laterality: Right;  ? FEMUR IM NAIL  09/18/2012  ? Procedure: INTRAMEDULLARY (IM) RETROGRADE FEMORAL NAILING;  Surgeon: [name], MD;  Location: [place];  Service: Orthopedics;  Laterality: Right;  ? GASTRIC BYPASS  2003  ? IABP INSERTION N/A 10/26/2021  ? Procedure: IABP Insertion;  Surgeon: Jettie Booze, MD;  Location: [place] [facility];  Service: Cardiovascular;  Laterality: N/A;  ? JOINT REPLACEMENT    ? LEFT HEART CATH AND CORONARY ANGIOGRAPHY N/A 10/26/2021  ? Procedure: LEFT HEART CATH AND CORONARY ANGIOGRAPHY;  Surgeon: Jettie Booze, MD;  Location: [place] [facility];  Service: Cardiovascular;  Laterality: N/A;  ? NECK SURGERY  2000  ? ORIF FEMUR FRACTURE Left 10/15/2020  ? Procedure: OPEN REDUCTION INTERNAL FIXATION (ORIF) DISTAL FEMUR FRACTURE;  Surgeon: Shona Needles, MD;  Location: [place];  Service: Orthopedics;  Laterality: Left;  ? REPLACEMENT TOTAL KNEE  2003  ? right and left -4 months aoart.  ? REVERSE SHOULDER ARTHROPLASTY Right 10/20/2021  ? Procedure: REVERSE SHOULDER ARTHROPLASTY;  Surgeon: Marchia Bond, MD;  [facility];  Service: Orthopedics;  Laterality: Right;  ? TEE WITHOUT CARDIOVERSION N/A 10/27/2021  ?  Procedure: TRANSESOPHAGEAL ECHOCARDIOGRAM (TEE);  Surgeon: Dahlia Byes, MD;  Location: [place];  Service: Open Heart Surgery;  Laterality: N/A;  ? TEMPORARY PACEMAKER N/A 10/26/2021  ? Procedure: TEMPORARY PACEMAKER;  Surgeon: Jettie Booze, MD;  Location: [place] [facility];  Service: Cardiovascular;  Laterality: N/A;  ? TONSILLECTOMY    ? WRIST FRACTURE SURGERY Bilateral 08/2011  ?  ? ?Initial Focused Assessment (If applicable, or please see trauma documentation): ?- GCS 15 ?- Hematoma to back of head ?- C-collar initially in place but pt removed it due to comfort reasons - no neck pain ?- c/o headache ?- PERRLA ? ?CT's Completed:   ?CT Head and CT C-Spine  ? ?Interventions:  ?- CXR ?- pelvic XR ?- CT head and neck ?- 20G to R FA ?- CBC/BMP and INR drawn ?- spoke with pt's family and friend ? ?Plan for disposition:  ?Discharge home  ? ?Consults completed:  ?none at 1730. ? ?Event Summary: ?Pt was at home when she slipped on her sock and fell backwards striking the back of her head on concrete.  This was a witnessed mechanical fall by her mother in law.  Pt did not lose consciousness but did sustain a hematoma to the back of her head.  Pt also takes coumadin. ? ?MTP Summary (If applicable): n/a ? ?Bedside handoff with ED RN Raquel Sarna.   ? ?Dulcy Fanny W  ?Trauma Response RN ? ?Please call TRN at [phone] for further assistance. ? ? ?

## 2022-01-01 ENCOUNTER — Telehealth: Payer: Self-pay | Admitting: Interventional Cardiology

## 2022-01-01 NOTE — Telephone Encounter (Signed)
Calling to confirm in the patient was put on fluid restriction. Please advise  ?

## 2022-01-01 NOTE — Telephone Encounter (Signed)
Spoke with Lattie Haw who reports the patient is drinking two bottles of water, two cups of coffee, two sodas all of which are 16 ounces each per day. She would like to know if the patient was put on any fluid restrictions by [place]. She states that the patient is having some shortness of breath with exertion. None at rest. Patient sent message in earlier today about weight gain. Lattie Haw is unsure about the weight gain. Patient is taking her lasix 40 mg daily. She states that they are concerned about the patient living alone and are trying to work with the family to come up with a plan so that she has 24/7 care. Patient is following up with Richardson Dopp, PA-C on Tuesday 4/18. Lattie Haw is requesting that fluid restrictions be discussed with the patient at this visit. She is asking that we call and update her of any changes after the visit so that we can make sure everyone is on the same page.  ?

## 2022-01-02 ENCOUNTER — Other Ambulatory Visit: Payer: Self-pay

## 2022-01-02 ENCOUNTER — Emergency Department (HOSPITAL_COMMUNITY): Payer: Medicare Other

## 2022-01-02 ENCOUNTER — Encounter (HOSPITAL_COMMUNITY): Payer: Self-pay | Admitting: Emergency Medicine

## 2022-01-02 ENCOUNTER — Inpatient Hospital Stay (HOSPITAL_COMMUNITY)
Admission: EM | Admit: 2022-01-02 | Discharge: 2022-01-08 | DRG: 286 | Disposition: A | Discharge status: Skilled Nursing Facility | Payer: Medicare Other | Attending: Internal Medicine | Admitting: Internal Medicine

## 2022-01-02 DIAGNOSIS — G9341 Metabolic encephalopathy: Secondary | ICD-10-CM | POA: Diagnosis present

## 2022-01-02 DIAGNOSIS — I5032 Chronic diastolic (congestive) heart failure: Secondary | ICD-10-CM | POA: Diagnosis present

## 2022-01-02 DIAGNOSIS — Z96653 Presence of artificial knee joint, bilateral: Secondary | ICD-10-CM | POA: Diagnosis present

## 2022-01-02 DIAGNOSIS — I2510 Atherosclerotic heart disease of native coronary artery without angina pectoris: Secondary | ICD-10-CM | POA: Diagnosis present

## 2022-01-02 DIAGNOSIS — I50813 Acute on chronic right heart failure: Secondary | ICD-10-CM

## 2022-01-02 DIAGNOSIS — Z951 Presence of aortocoronary bypass graft: Secondary | ICD-10-CM

## 2022-01-02 DIAGNOSIS — N179 Acute kidney failure, unspecified: Secondary | ICD-10-CM | POA: Diagnosis present

## 2022-01-02 DIAGNOSIS — I5033 Acute on chronic diastolic (congestive) heart failure: Secondary | ICD-10-CM | POA: Diagnosis not present

## 2022-01-02 DIAGNOSIS — K219 Gastro-esophageal reflux disease without esophagitis: Secondary | ICD-10-CM | POA: Diagnosis present

## 2022-01-02 DIAGNOSIS — I5082 Biventricular heart failure: Secondary | ICD-10-CM | POA: Diagnosis present

## 2022-01-02 DIAGNOSIS — I083 Combined rheumatic disorders of mitral, aortic and tricuspid valves: Secondary | ICD-10-CM | POA: Diagnosis present

## 2022-01-02 DIAGNOSIS — I2609 Other pulmonary embolism with acute cor pulmonale: Secondary | ICD-10-CM | POA: Diagnosis present

## 2022-01-02 DIAGNOSIS — F39 Unspecified mood [affective] disorder: Secondary | ICD-10-CM | POA: Diagnosis present

## 2022-01-02 DIAGNOSIS — R531 Weakness: Secondary | ICD-10-CM

## 2022-01-02 DIAGNOSIS — I10 Essential (primary) hypertension: Secondary | ICD-10-CM | POA: Diagnosis present

## 2022-01-02 DIAGNOSIS — G8929 Other chronic pain: Secondary | ICD-10-CM | POA: Diagnosis present

## 2022-01-02 DIAGNOSIS — D638 Anemia in other chronic diseases classified elsewhere: Secondary | ICD-10-CM | POA: Diagnosis present

## 2022-01-02 DIAGNOSIS — I50810 Right heart failure, unspecified: Secondary | ICD-10-CM | POA: Diagnosis present

## 2022-01-02 DIAGNOSIS — J9601 Acute respiratory failure with hypoxia: Secondary | ICD-10-CM | POA: Diagnosis not present

## 2022-01-02 DIAGNOSIS — I252 Old myocardial infarction: Secondary | ICD-10-CM

## 2022-01-02 DIAGNOSIS — Y92009 Unspecified place in unspecified non-institutional (private) residence as the place of occurrence of the external cause: Secondary | ICD-10-CM | POA: Diagnosis not present

## 2022-01-02 DIAGNOSIS — I110 Hypertensive heart disease with heart failure: Secondary | ICD-10-CM | POA: Diagnosis present

## 2022-01-02 DIAGNOSIS — J9621 Acute and chronic respiratory failure with hypoxia: Secondary | ICD-10-CM | POA: Diagnosis present

## 2022-01-02 DIAGNOSIS — I952 Hypotension due to drugs: Secondary | ICD-10-CM | POA: Diagnosis not present

## 2022-01-02 DIAGNOSIS — J9622 Acute and chronic respiratory failure with hypercapnia: Secondary | ICD-10-CM | POA: Diagnosis present

## 2022-01-02 DIAGNOSIS — Z886 Allergy status to analgesic agent status: Secondary | ICD-10-CM

## 2022-01-02 DIAGNOSIS — J9602 Acute respiratory failure with hypercapnia: Secondary | ICD-10-CM | POA: Diagnosis not present

## 2022-01-02 DIAGNOSIS — Z86711 Personal history of pulmonary embolism: Secondary | ICD-10-CM | POA: Diagnosis not present

## 2022-01-02 DIAGNOSIS — D649 Anemia, unspecified: Secondary | ICD-10-CM | POA: Diagnosis not present

## 2022-01-02 DIAGNOSIS — Z87891 Personal history of nicotine dependence: Secondary | ICD-10-CM

## 2022-01-02 DIAGNOSIS — I959 Hypotension, unspecified: Secondary | ICD-10-CM | POA: Diagnosis present

## 2022-01-02 DIAGNOSIS — Z20822 Contact with and (suspected) exposure to covid-19: Secondary | ICD-10-CM | POA: Diagnosis present

## 2022-01-02 DIAGNOSIS — R296 Repeated falls: Secondary | ICD-10-CM | POA: Diagnosis present

## 2022-01-02 DIAGNOSIS — M810 Age-related osteoporosis without current pathological fracture: Secondary | ICD-10-CM | POA: Diagnosis present

## 2022-01-02 DIAGNOSIS — R5381 Other malaise: Secondary | ICD-10-CM | POA: Diagnosis not present

## 2022-01-02 DIAGNOSIS — F3181 Bipolar II disorder: Secondary | ICD-10-CM | POA: Diagnosis present

## 2022-01-02 DIAGNOSIS — E876 Hypokalemia: Secondary | ICD-10-CM | POA: Diagnosis not present

## 2022-01-02 DIAGNOSIS — Z9049 Acquired absence of other specified parts of digestive tract: Secondary | ICD-10-CM

## 2022-01-02 DIAGNOSIS — F419 Anxiety disorder, unspecified: Secondary | ICD-10-CM | POA: Diagnosis present

## 2022-01-02 DIAGNOSIS — E785 Hyperlipidemia, unspecified: Secondary | ICD-10-CM | POA: Diagnosis present

## 2022-01-02 DIAGNOSIS — I509 Heart failure, unspecified: Secondary | ICD-10-CM

## 2022-01-02 DIAGNOSIS — Z6841 Body Mass Index (BMI) 40.0 and over, adult: Secondary | ICD-10-CM

## 2022-01-02 DIAGNOSIS — Z79899 Other long term (current) drug therapy: Secondary | ICD-10-CM

## 2022-01-02 DIAGNOSIS — Z853 Personal history of malignant neoplasm of breast: Secondary | ICD-10-CM

## 2022-01-02 DIAGNOSIS — W010XXA Fall on same level from slipping, tripping and stumbling without subsequent striking against object, initial encounter: Secondary | ICD-10-CM | POA: Diagnosis present

## 2022-01-02 DIAGNOSIS — T502X5A Adverse effect of carbonic-anhydrase inhibitors, benzothiadiazides and other diuretics, initial encounter: Secondary | ICD-10-CM | POA: Diagnosis present

## 2022-01-02 DIAGNOSIS — E662 Morbid (severe) obesity with alveolar hypoventilation: Secondary | ICD-10-CM | POA: Diagnosis present

## 2022-01-02 DIAGNOSIS — G4733 Obstructive sleep apnea (adult) (pediatric): Secondary | ICD-10-CM | POA: Diagnosis not present

## 2022-01-02 DIAGNOSIS — Z888 Allergy status to other drugs, medicaments and biological substances status: Secondary | ICD-10-CM

## 2022-01-02 DIAGNOSIS — I4891 Unspecified atrial fibrillation: Secondary | ICD-10-CM | POA: Diagnosis present

## 2022-01-02 DIAGNOSIS — E440 Moderate protein-calorie malnutrition: Secondary | ICD-10-CM | POA: Diagnosis present

## 2022-01-02 DIAGNOSIS — Z79890 Hormone replacement therapy (postmenopausal): Secondary | ICD-10-CM

## 2022-01-02 DIAGNOSIS — E039 Hypothyroidism, unspecified: Secondary | ICD-10-CM | POA: Diagnosis present

## 2022-01-02 DIAGNOSIS — G894 Chronic pain syndrome: Secondary | ICD-10-CM

## 2022-01-02 DIAGNOSIS — Z7902 Long term (current) use of antithrombotics/antiplatelets: Secondary | ICD-10-CM

## 2022-01-02 DIAGNOSIS — R0902 Hypoxemia: Secondary | ICD-10-CM | POA: Diagnosis present

## 2022-01-02 DIAGNOSIS — Z7901 Long term (current) use of anticoagulants: Secondary | ICD-10-CM

## 2022-01-02 LAB — CBC
HCT: 34.6 % — ABNORMAL LOW (ref 36.0–46.0)
Hemoglobin: 11 g/dL — ABNORMAL LOW (ref 12.0–15.0)
MCH: 30.6 pg (ref 26.0–34.0)
MCHC: 31.8 g/dL (ref 30.0–36.0)
MCV: 96.1 fL (ref 80.0–100.0)
Platelets: 372 10*3/uL (ref 150–400)
RBC: 3.6 MIL/uL — ABNORMAL LOW (ref 3.87–5.11)
RDW: 14.7 % (ref 11.5–15.5)
WBC: 6.6 10*3/uL (ref 4.0–10.5)
nRBC: 0 % (ref 0.0–0.2)

## 2022-01-02 LAB — HEPATIC FUNCTION PANEL
ALT: 14 U/L (ref 0–44)
AST: 33 U/L (ref 15–41)
Albumin: 2.8 g/dL — ABNORMAL LOW (ref 3.5–5.0)
Alkaline Phosphatase: 121 U/L (ref 38–126)
Bilirubin, Direct: 0.4 mg/dL — ABNORMAL HIGH (ref 0.0–0.2)
Indirect Bilirubin: 0.6 mg/dL (ref 0.3–0.9)
Total Bilirubin: 1 mg/dL (ref 0.3–1.2)
Total Protein: 5.4 g/dL — ABNORMAL LOW (ref 6.5–8.1)

## 2022-01-02 LAB — BASIC METABOLIC PANEL
Anion gap: 10 (ref 5–15)
BUN: 33 mg/dL — ABNORMAL HIGH (ref 8–23)
CO2: 24 mmol/L (ref 22–32)
Calcium: 8.8 mg/dL — ABNORMAL LOW (ref 8.9–10.3)
Chloride: 101 mmol/L (ref 98–111)
Creatinine, Ser: 1.2 mg/dL — ABNORMAL HIGH (ref 0.44–1.00)
GFR, Estimated: 48 mL/min — ABNORMAL LOW (ref >60)
Glucose, Bld: 110 mg/dL — ABNORMAL HIGH (ref 70–99)
Potassium: 4.8 mmol/L (ref 3.5–5.1)
Sodium: 135 mmol/L (ref 135–145)

## 2022-01-02 LAB — PROTIME-INR
INR: 1.9 — ABNORMAL HIGH (ref 0.8–1.2)
Prothrombin Time: 21.8 seconds — ABNORMAL HIGH (ref 11.4–15.2)

## 2022-01-02 LAB — TROPONIN I (HIGH SENSITIVITY)
Troponin I (High Sensitivity): 54 ng/L — ABNORMAL HIGH (ref <18)
Troponin I (High Sensitivity): 48 ng/L — ABNORMAL HIGH (ref <18)

## 2022-01-02 LAB — BRAIN NATRIURETIC PEPTIDE: B Natriuretic Peptide: 405.8 pg/mL — ABNORMAL HIGH (ref 0.0–100.0)

## 2022-01-02 MED ORDER — ONDANSETRON HCL 4 MG PO TABS: 4.0000 mg | ORAL_TABLET | Freq: Four times a day (QID) | ORAL | Status: DC | PRN

## 2022-01-02 MED ORDER — ACETAMINOPHEN 650 MG RE SUPP: 650.0000 mg | Freq: Four times a day (QID) | RECTAL | Status: DC | PRN

## 2022-01-02 MED ORDER — LEVOTHYROXINE SODIUM 88 MCG PO TABS
88.0000 ug | ORAL_TABLET | Freq: Every day | ORAL | Status: DC
  Administered 2022-01-03 – 2022-01-08 (×6): 88 ug via ORAL
  Filled 2022-01-02 (×8): qty 1

## 2022-01-02 MED ORDER — CARBAMAZEPINE ER 200 MG PO TB12
400.0000 mg | ORAL_TABLET | Freq: Two times a day (BID) | ORAL | Status: DC
  Administered 2022-01-03 – 2022-01-08 (×12): 400 mg via ORAL
  Filled 2022-01-02 (×13): qty 2

## 2022-01-02 MED ORDER — CLOPIDOGREL BISULFATE 75 MG PO TABS
75.0000 mg | ORAL_TABLET | Freq: Every day | ORAL | Status: DC
  Administered 2022-01-03 – 2022-01-08 (×6): 75 mg via ORAL
  Filled 2022-01-02 (×6): qty 1

## 2022-01-02 MED ORDER — ROSUVASTATIN CALCIUM 5 MG PO TABS
10.0000 mg | ORAL_TABLET | Freq: Every day | ORAL | Status: DC
  Administered 2022-01-03 – 2022-01-08 (×6): 10 mg via ORAL
  Filled 2022-01-02 (×6): qty 2

## 2022-01-02 MED ORDER — CYCLOBENZAPRINE HCL 10 MG PO TABS: 10.0000 mg | ORAL_TABLET | Freq: Three times a day (TID) | ORAL | Status: DC | PRN

## 2022-01-02 MED ORDER — METOPROLOL SUCCINATE ER 25 MG PO TB24
12.5000 mg | ORAL_TABLET | Freq: Every day | ORAL | Status: DC
Start: 2022-01-03 — End: 2022-01-03

## 2022-01-02 MED ORDER — DULOXETINE HCL 30 MG PO CPEP
30.0000 mg | ORAL_CAPSULE | Freq: Two times a day (BID) | ORAL | Status: DC
  Administered 2022-01-03: 30 mg via ORAL
  Filled 2022-01-02 (×2): qty 1

## 2022-01-02 MED ORDER — ONDANSETRON HCL 4 MG/2ML IJ SOLN: 4.0000 mg | Freq: Four times a day (QID) | INTRAMUSCULAR | Status: DC | PRN

## 2022-01-02 MED ORDER — OXYCODONE-ACETAMINOPHEN 10-325 MG PO TABS
1.0000 | ORAL_TABLET | Freq: Four times a day (QID) | ORAL | Status: DC | PRN
Start: 2022-01-02 — End: 2022-01-02

## 2022-01-02 MED ORDER — ACETAMINOPHEN 325 MG PO TABS
650.0000 mg | ORAL_TABLET | Freq: Four times a day (QID) | ORAL | Status: DC | PRN
  Administered 2022-01-03 – 2022-01-07 (×6): 650 mg via ORAL
  Filled 2022-01-02 (×7): qty 2

## 2022-01-02 MED ORDER — BUPROPION HCL ER (XL) 300 MG PO TB24
300.0000 mg | ORAL_TABLET | Freq: Every day | ORAL | Status: DC
  Filled 2022-01-02: qty 1

## 2022-01-02 MED ORDER — OXYCODONE-ACETAMINOPHEN 5-325 MG PO TABS
1.0000 | ORAL_TABLET | Freq: Four times a day (QID) | ORAL | Status: DC | PRN
  Administered 2022-01-03: 1 via ORAL
  Filled 2022-01-02: qty 1

## 2022-01-02 MED ORDER — FUROSEMIDE 10 MG/ML IJ SOLN: 40.0000 mg | Freq: Two times a day (BID) | INTRAMUSCULAR | Status: DC

## 2022-01-02 MED ORDER — QUETIAPINE FUMARATE 300 MG PO TABS
300.0000 mg | ORAL_TABLET | Freq: Every day | ORAL | Status: DC
  Administered 2022-01-03: 300 mg via ORAL
  Filled 2022-01-02: qty 1

## 2022-01-02 MED ORDER — SENNOSIDES-DOCUSATE SODIUM 8.6-50 MG PO TABS: 1.0000 | ORAL_TABLET | Freq: Every evening | ORAL | Status: DC | PRN

## 2022-01-02 MED ORDER — VORTIOXETINE HBR 20 MG PO TABS
20.0000 mg | ORAL_TABLET | Freq: Every day | ORAL | Status: DC
  Administered 2022-01-03: 20 mg via ORAL
  Filled 2022-01-02: qty 1

## 2022-01-02 MED ORDER — POTASSIUM CHLORIDE CRYS ER 20 MEQ PO TBCR: 20.0000 meq | EXTENDED_RELEASE_TABLET | Freq: Every day | ORAL | Status: DC

## 2022-01-02 MED ORDER — SODIUM CHLORIDE 0.9% FLUSH
3.0000 mL | Freq: Two times a day (BID) | INTRAVENOUS | Status: DC
  Administered 2022-01-04 – 2022-01-08 (×6): 3 mL via INTRAVENOUS

## 2022-01-02 MED ORDER — LIOTHYRONINE SODIUM 25 MCG PO TABS
25.0000 ug | ORAL_TABLET | Freq: Every day | ORAL | Status: DC
  Administered 2022-01-03 – 2022-01-08 (×6): 25 ug via ORAL
  Filled 2022-01-02 (×7): qty 1

## 2022-01-02 MED ORDER — PANTOPRAZOLE SODIUM 40 MG PO TBEC
40.0000 mg | DELAYED_RELEASE_TABLET | Freq: Every day | ORAL | Status: DC
  Administered 2022-01-03 – 2022-01-08 (×6): 40 mg via ORAL
  Filled 2022-01-02 (×6): qty 1

## 2022-01-02 MED ORDER — GABAPENTIN 400 MG PO CAPS
800.0000 mg | ORAL_CAPSULE | Freq: Four times a day (QID) | ORAL | Status: DC
  Administered 2022-01-03: 800 mg via ORAL
  Filled 2022-01-02 (×3): qty 2

## 2022-01-02 MED ORDER — OXYCODONE HCL 5 MG PO TABS: 5.0000 mg | ORAL_TABLET | Freq: Four times a day (QID) | ORAL | Status: DC | PRN

## 2022-01-02 MED ORDER — FUROSEMIDE 10 MG/ML IJ SOLN
40.0000 mg | Freq: Once | INTRAMUSCULAR | Status: AC
  Administered 2022-01-02: 40 mg via INTRAVENOUS
  Filled 2022-01-02: qty 4

## 2022-01-02 NOTE — Assessment & Plan Note (Signed)
On multiple medications for management of bipolar 2 disorder and GAD. ?-Continue home bupropion, carbamazepine, duloxetine, Seroquel, Trintellix ?

## 2022-01-02 NOTE — Assessment & Plan Note (Addendum)
Continue blood pressure support with midodrine.  

## 2022-01-02 NOTE — Assessment & Plan Note (Signed)
Continue home levothyroxine and liothyronine. ?

## 2022-01-02 NOTE — H&P (Signed)
?History and Physical  ? ? ?Deborah Cooke VPX:[id number] DOB: 1951/03/09 DOA: 01/02/2022 ? ?PCP: Rich Fuchs, PA  ?Patient coming from: Home ? ?I have personally briefly reviewed patient's old medical records in [place] ? ?Chief Complaint: Dyspnea ? ?HPI: ?Deborah Cooke is a 71 y.o. 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 ? ?Patient last admitted 11/26/2021-12/01/2021 for acute PE.  She was started on Coumadin.  She was also treated for acute on chronic right-sided heart failure with IV Lasix diuresis.  She was discharged to SNF and returned home 10 days prior to this admission. ? ?Patient states she was doing well during her SNF stay.  Since she returned home she has had progressively worsening exertional dyspnea.  She has significantly worsening edema to both of her lower extremities compared to her baseline.  She states that she has a 15 pound weight gain in the last week.  She has not had any chest pain.  She reports waking up short of breath but denies any significant orthopnea.  She states that she is currently taking Lasix 40 mg daily and reports good urine output. ? ?Patient states her current daily fluid intake consists of two 16.9 ounce bottles of water, two 16.9 oz bottles of diet soda, and two 12 oz cups of coffee.  She says she is having difficulty mobilizing due to heaviness in her legs even with the use of a walker. ? ?ED Course  Labs/Imaging on admission: I have personally reviewed following labs and imaging studies. ? ?Initial vitals show BP 126/114, pulse 94, RR 26, temp 98.4 ?F, SPO2 initially 94% on room air. ? ?Labs show WBC 6.6, hemoglobin 11.0, platelets 372,000, sodium 135, potassium 4.8, bicarb 24, BUN 33, creatinine 1.20 (baseline  0.7-0.8), serum glucose 110, troponin 54 > 48, BNP 405.8, INR 1.9. ? ?2 view chest x-ray shows prior CABG changes, stable mild central vascular congestion.  No focal consolidation or effusion. ? ?Patient was given IV Lasix 40 mg and the hospitalist service was consulted to admit for further evaluation and management. ? ?Review of Systems: All systems reviewed and are negative except as documented in history of present illness above. ? ? ?Past Medical History:  ?Diagnosis Date  ? Adverse effect of anesthetic 01/2012  ? difficulty waking up.  ? Adverse effect of unspecified anesthetic, initial encounter 08/2011  ? low o2 sats.  ? Anemia   ? Anxiety   ? Arthritis   ? Breast cancer ([place]) 02/16/2017  ? left breast  ? Chronic cor pulmonale ([place]) 12/14/2021  ? Echocardiogram 10/27/21 EF 60-65, no RWMA, Gr 2 DD, D shaped LV c/w RV vol overload, findings c/w acute cor pulmonale, mod reduced RVSF, mildly elevated PASP, RVSP 44, mod BAE, severe MAC, mean MV 3 mmHg, mild dilation of ascending aorta (39 mm)  ? Chronic pain   ? Closed supracondylar fracture of right femur ([place]) 09/18/2012  ? Depression   ? Family history of breast cancer   ? GERD (gastroesophageal reflux disease)   ? pt denies  ? Headache(784.0)   ? Hypertension   ? Hypothyroidism   ? Opioid dependence in controlled environment [facility])   ? Osteoporosis   ? Osteoporosis with fracture 09/19/2012  ? Pneumonia   ? Thyroid disease   ? ? ?  Past Surgical History:  ?Procedure Laterality Date  ?  OPEN REDUCTION INTERNAL FIXATION (ORIF) DISTAL FEMUR FRACTURE (Left Leg Upper)  10/15/2020  ? BACK SURGERY    ? fusion mid back, lumbar and thoracic  ? BREAST BIOPSY Left 02/16/2017  ? BREAST LUMPECTOMY WITH RADIOACTIVE SEED AND SENTINEL LYMPH NODE BIOPSY Left 03/15/2017  ? Procedure: LEFT BREAST LUMPECTOMY WITH RADIOACTIVE SEED AND LEFT AXILLARY SENTINEL LYMPH NODE BIOPSY;  Surgeon: [name] Overall, MD;  Location: [place];  Service: General;  Laterality: Left;  ?  CHOLECYSTECTOMY  10/1975  ? CORONARY ARTERY BYPASS GRAFT N/A 10/27/2021  ? Procedure: CORONARY ARTERY BYPASS GRAFTING (CABG) TIMES FOUR, USING LEFT INTERNAL MAMMARY ARTERY AND RIGHT LEG GREATER SAPHENOUS VEIN HARVESTED ENDOSCOPICALLY AND RIGHT CORONARY THROMBECTOMY;  Surgeon: Dahlia Byes, MD;  Location: [place];  Service: Open Heart Surgery;  Laterality: N/A;  ? CORONARY/GRAFT ACUTE MI REVASCULARIZATION N/A 10/26/2021  ? Procedure: Coronary/Graft Acute MI Revascularization;  Surgeon: Jettie Booze, MD;  Location: [place] [facility];  Service: Cardiovascular;  Laterality: N/A;  ? ENDOVEIN HARVEST OF GREATER SAPHENOUS VEIN Right 10/27/2021  ? Procedure: ENDOVEIN HARVEST OF GREATER SAPHENOUS VEIN;  Surgeon: Dahlia Byes, MD;  Location: [place];  Service: Open Heart Surgery;  Laterality: Right;  ? FEMUR IM NAIL  09/18/2012  ? Procedure: INTRAMEDULLARY (IM) RETROGRADE FEMORAL NAILING;  Surgeon: [name], MD;  Location: [place];  Service: Orthopedics;  Laterality: Right;  ? GASTRIC BYPASS  2003  ? IABP INSERTION N/A 10/26/2021  ? Procedure: IABP Insertion;  Surgeon: Jettie Booze, MD;  Location: [place] [facility];  Service: Cardiovascular;  Laterality: N/A;  ? JOINT REPLACEMENT    ? LEFT HEART CATH AND CORONARY ANGIOGRAPHY N/A 10/26/2021  ? Procedure: LEFT HEART CATH AND CORONARY ANGIOGRAPHY;  Surgeon: Jettie Booze, MD;  Location: [place] [facility];  Service: Cardiovascular;  Laterality: N/A;  ? NECK SURGERY  2000  ? ORIF FEMUR FRACTURE Left 10/15/2020  ? Procedure: OPEN REDUCTION INTERNAL FIXATION (ORIF) DISTAL FEMUR FRACTURE;  Surgeon: Shona Needles, MD;  Location: [place];  Service: Orthopedics;  Laterality: Left;  ? REPLACEMENT TOTAL KNEE  2003  ? right and left -4 months aoart.  ? REVERSE SHOULDER ARTHROPLASTY Right 10/20/2021  ? Procedure: REVERSE SHOULDER ARTHROPLASTY;  Surgeon: Marchia Bond, MD;  [facility];  Service: Orthopedics;  Laterality: Right;  ? TEE WITHOUT CARDIOVERSION N/A  10/27/2021  ? Procedure: TRANSESOPHAGEAL ECHOCARDIOGRAM (TEE);  Surgeon: Dahlia Byes, MD;  Location: [place];  Service: Open Heart Surgery;  Laterality: N/A;  ? TEMPORARY PACEMAKER N/A 10/26/2021  ? Procedure: TEMPORARY PACEMAKER;  Surgeon: Jettie Booze, MD;  Location: [place] [facility];  Service: Cardiovascular;  Laterality: N/A;  ? TONSILLECTOMY    ? WRIST FRACTURE SURGERY Bilateral 08/2011  ? ? ?Social History: ? reports that she quit smoking about 32 years ago. Her smoking use included cigarettes. She has a 28.00 pack-year smoking history. She quit smokeless tobacco use about 32 years ago. She reports current alcohol use. She reports that she does not use drugs. ? ?Allergies  ?Allergen Reactions  ? Nsaids Nausea And Vomiting and Other (See Comments)  ?  GI Upset (ibuprofen included)  ? Ibuprofen Other (See Comments)  ?  Other reaction(s): Abdominal Pain  ? Tolmetin Other (See Comments)  ?  Other reaction(s): Abdominal Pain ?  ? ? ?Family History  ?Problem Relation Age of Onset  ? Breast cancer Mother 28  ?  died at 40  ? Cancer Father 67  ?     uretherial cancer   ? ? ? ?Prior to Admission medications   ?Medication Sig Start Date End Date Taking? Authorizing Provider  ?acetaminophen (TYLENOL) 325 MG tablet Take 2 tablets (650 mg total) by mouth every 6 (six) hours as needed for mild pain or moderate pain. 11/11/21  Yes Roddenberry, Arlis Porta, PA-C  ?anastrozole (ARIMIDEX) 1 MG tablet TAKE 1 TABLET BY MOUTH  DAILY ?Patient taking differently: Take 1 mg by mouth daily. 08/21/21  Yes Truitt Merle, MD  ?B Complex Vitamins (B COMPLEX 100 PO) Take 1 tablet by mouth daily.   Yes [provider]  ?Biotin 10000 MCG TABS Take 10,000 mcg by mouth daily.   Yes [provider]  ?buPROPion (WELLBUTRIN XL) 300 MG 24 hr tablet Take 300 mg by mouth daily. 06/09/21  Yes [provider]  ?Calcium Carbonate (CALCIUM 600 PO) Take 600 mg by mouth in the morning and at bedtime.   Yes [provider]  ?carbamazepine (TEGRETOL XR) 200 MG 12 hr tablet Take 400 mg by mouth 2 (two) times daily. 07/26/18 01/02/22 Yes [provider]  ?cholecalciferol (VITAMIN D) 400 units TABS tablet Take 400 Units by mout

## 2022-01-02 NOTE — Assessment & Plan Note (Addendum)
Hypokalemia, hypocalcemia  ? ?Negative fluid balance was achieved with improvement in renal function, electrolytes were corrected. ?At the time of her discharge her renal function has a serum cr of 0,79, K is 3,8 and serum bicarbonate ate 27. ? ?Plan to continue diuresis with torsemide and spironolactone and follow up renal function as outpatient.  ?

## 2022-01-02 NOTE — ED Triage Notes (Addendum)
Pt to triage via [facility] from home.  Reports SOB x 10 days.  Pt had a CABG 4 weeks ago and states she was diagnosed with a PE and CHF 2 weeks ago.  Takes Coumadin.  SOB worse with exertion.   C/o lower extremity edema- taking Lasix. ? ?Pt returned home from nursing home 10 days ago.  Seen in ED 2 days ago for a fall and states her SOB was not addressed on that visit.  ? ? ? ?

## 2022-01-02 NOTE — ED Provider Notes (Signed)
?[facility] ?Provider Note ? ? ?CSN: [id number] ?Arrival date & time: 01/02/22  1803 ? ?  ? ?History ? ?Chief Complaint  ?Patient presents with  ? Shortness of Breath  ? ? ?Deborah Cooke is a 71 y.o. female. ? ?Patient is a 70 year old female with a history of MI status post four-vessel CABG in February of this year, then PE 1 month later, recurrent falls and echo showing right-sided heart failure with cor pulmonale who is now currently on Coumadin and was discharged from rehab 10 days ago presenting today with worsening weakness, shortness of breath and hypoxia.  Patient reports that she has been taking 40 mg of Lasix daily but unfortunately she has gone up 15 pounds in the last 1 week.  She is finding it increasingly difficult to move around and reports she tries to use her walker and her legs just cannot seem to hold her up.  She had a fall 2 days ago where her legs gave out and she fell back onto the concrete.  She was seen in the emergency room and at that time had no acute fracture and was discharged home.  Patient's son is also present at bedside and reports since that fall 2 days ago her mobility has become even worse.  She also has had worsening shortness of breath.  She becomes winded going approximately 5 to 10 feet and at home her son put a pulse ox monitor on her and it was reading 84% after she had rested for 1 hour.  Patient has not had significant cough or fever.  She denies any chest pain or abdominal pain.  She is still been making urine and denies any diarrhea. ? ?The history is provided by the patient, medical records and a relative.  ?Shortness of Breath ? ?  ? ?Home Medications ?Prior to Admission medications   ?Medication Sig Start Date End Date Taking? Authorizing Provider  ?acetaminophen (TYLENOL) 325 MG tablet Take 2 tablets (650 mg total) by mouth every 6 (six) hours as needed for mild pain or moderate pain. 11/11/21  Yes Roddenberry, Arlis Porta, PA-C   ?anastrozole (ARIMIDEX) 1 MG tablet TAKE 1 TABLET BY MOUTH  DAILY ?Patient taking differently: Take 1 mg by mouth daily. 08/21/21  Yes Truitt Merle, MD  ?B Complex Vitamins (B COMPLEX 100 PO) Take 1 tablet by mouth daily.   Yes [provider]  ?Biotin 10000 MCG TABS Take 10,000 mcg by mouth daily.   Yes [provider]  ?buPROPion (WELLBUTRIN XL) 300 MG 24 hr tablet Take 300 mg by mouth daily. 06/09/21  Yes [provider]  ?Calcium Carbonate (CALCIUM 600 PO) Take 600 mg by mouth in the morning and at bedtime.   Yes [provider]  ?carbamazepine (TEGRETOL XR) 200 MG 12 hr tablet Take 400 mg by mouth 2 (two) times daily. 07/26/18 01/02/22 Yes [provider]  ?cholecalciferol (VITAMIN D) 400 units TABS tablet Take 400 Units by mouth daily.   Yes [provider]  ?clopidogrel (PLAVIX) 75 MG tablet Take 1 tablet (75 mg total) by mouth daily. Do not stop unless directed by cardiology or cardiothoracic surgery. 12/15/21  Yes Weaver, Scott T, PA-C  ?cyclobenzaprine (FLEXERIL) 10 MG tablet Take 1 tablet (10 mg total) by mouth 3 (three) times daily as needed for muscle spasms. ?Patient taking differently: Take 10 mg by mouth 3 (three) times daily. 10/17/20  Yes McClung, Sarah A, PA-C  ?DULoxetine (CYMBALTA) 30 MG capsule  Take 30 mg by mouth 2 (two) times daily.   Yes [provider]  ?ferrous sulfate 325 (65 FE) MG EC tablet Take 325 mg by mouth daily with breakfast.   Yes [provider]  ?furosemide (LASIX) 40 MG tablet Take 1 tablet (40 mg total) by mouth daily. 12/22/21  Yes Weaver, Scott T, PA-C  ?gabapentin (NEURONTIN) 800 MG tablet Take 800 mg by mouth 4 (four) times daily. 12/25/21  Yes [provider]  ?hydrOXYzine (ATARAX) 50 MG tablet Take 50 mg by mouth 3 (three) times daily. 12/06/21  Yes [provider]  ?latanoprost (XALATAN) 0.005 % ophthalmic solution Place 1 drop into both eyes at bedtime. 06/25/16  Yes [provider]   ?levothyroxine (SYNTHROID) 88 MCG tablet Take 88 mcg by mouth daily before breakfast. Also take liothyronine 25   Yes [provider]  ?liothyronine (CYTOMEL) 25 MCG tablet Take 25 mcg by mouth daily. Also take levothyroxine 88 10/28/21  Yes [provider]  ?metoprolol succinate (TOPROL-XL) 25 MG 24 hr tablet Take 0.5 tablets (12.5 mg total) by mouth daily. 12/15/21  Yes Richardson Dopp T, PA-C  ?Multiple Vitamins-Iron (MULTIVITAMIN/IRON PO) Take 1 tablet by mouth daily.    Yes [provider]  ?omeprazole (PRILOSEC) 20 MG capsule Take 20 mg by mouth every evening. 12/06/21  Yes [provider]  ?oxyCODONE-acetaminophen (PERCOCET) 10-325 MG tablet Take 1 tablet by mouth every 8 (eight) hours as needed for pain. ?Patient taking differently: Take 1 tablet by mouth every 6 (six) hours as needed for pain. 12/01/21  Yes Pokhrel, [place], MD  ?pantoprazole (PROTONIX) 40 MG tablet Take 1 tablet (40 mg total) by mouth daily. 11/12/21  Yes Roddenberry, Arlis Porta, PA-C  ?polyethylene glycol (MIRALAX / GLYCOLAX) 17 g packet Take 17 g by mouth 2 (two) times daily. ?Patient taking differently: Take 17 g by mouth daily. 10/21/20  Yes Patrecia Pour, MD  ?potassium chloride SA (KLOR-CON M) 20 MEQ tablet Take 1 tablet (20 mEq total) by mouth daily. 12/15/21  Yes Weaver, Scott T, PA-C  ?QUEtiapine (SEROQUEL) 300 MG tablet Take 300 mg by mouth at bedtime.   Yes [provider]  ?rosuvastatin (CRESTOR) 10 MG tablet Take 1 tablet (10 mg total) by mouth daily. 12/15/21  Yes Weaver, Scott T, PA-C  ?simethicone (MYLICON) 80 MG chewable tablet Chew 80 mg by mouth every 6 (six) hours as needed for flatulence.   Yes [provider]  ?TRINTELLIX 20 MG TABS tablet Take 20 mg by mouth at bedtime. 11/24/21  Yes [provider]  ?warfarin (COUMADIN) 3 MG tablet Take 1 to 1.5 tablets by mouth once daily as directed by anticoagulation clinic ?Patient taking differently: Take 3-4.5 mg by mouth See admin  instructions. 4.5 mg Tuesday and Thursday ?3 mg Monday,Wednesday,Friday,Saturday and sunday 12/30/21  Yes Jettie Booze, MD  ?enoxaparin (LOVENOX) 120 MG/0.8ML injection Inject 0.74 mLs (110 mg total) into the skin every 12 (twelve) hours for 3 days. Until INR between 2-3 ?Patient not taking: Reported on 01/02/2022 12/01/21 12/04/21  Flora Lipps, MD  ?gabapentin (NEURONTIN) 100 MG capsule Take 2 capsules (200 mg total) by mouth 3 (three) times daily. ?Patient not taking: Reported on 01/02/2022 11/11/21   Antony Odea, PA-C  ?   ? ?Allergies    ?Nsaids, Ibuprofen, and Tolmetin   ? ?Review of Systems   ?Review of Systems  ?Respiratory:  Positive for shortness of breath.   ? ?Physical Exam ?Updated Vital Signs ?BP 119/61  Pulse (!) 102   Temp 98.4 ?F (36.9 ?C) (Oral)   Resp 16   SpO2 (!) 87%  ?Physical Exam ?Vitals and nursing note reviewed.  ?Constitutional:   ?   General: She is not in acute distress. ?   Appearance: She is well-developed.  ?HENT:  ?   Head: Normocephalic and atraumatic.  ?   Mouth/Throat:  ?   Mouth: Mucous membranes are dry.  ?Eyes:  ?   Pupils: Pupils are equal, round, and reactive to light.  ?Cardiovascular:  ?   Rate and Rhythm: Normal rate and regular rhythm.  ?   Heart sounds: Normal heart sounds. No murmur heard. ?  No friction rub.  ?Pulmonary:  ?   Effort: Pulmonary effort is normal. Tachypnea present.  ?   Breath sounds: Examination of the right-lower field reveals decreased breath sounds. Examination of the left-lower field reveals decreased breath sounds. Decreased breath sounds present. No wheezing or rales.  ?   Comments: Well healed sternotomy scar ?Abdominal:  ?   General: Bowel sounds are normal. There is no distension.  ?   Palpations: Abdomen is soft.  ?   Tenderness: There is no abdominal tenderness. There is no guarding or rebound.  ?Musculoskeletal:     ?   General: No tenderness. Normal range of motion.  ?   Cervical back: Normal range of motion and neck  supple.  ?   Right lower leg: Edema present.  ?   Left lower leg: Edema present.  ?   Comments: 3+ pitting edema present in bilateral lower extremities up to the knee  ?Skin: ?   General: Skin is warm and dry.  ?

## 2022-01-02 NOTE — Hospital Course (Addendum)
Mrs. Nayak was admitted to the hospital with the working diagnosis of decompensated heart failure.  ? ?71 y.o. F with CAD s/p CABG x4 10/27/2021, PE on Coumadin, chronic cor pulmonale (TTE 10/27/2021 shows EF 60-65%, G2DD), HTN, hypothyroidism, bipolar 2 disorder, depression/anxiety, chronic pain who presented with progressive dyspnea on exertion.   ?Recent hospitalization 03/09 to 12/01/21 at that time she was diagnosed with pulmonary embolism and decompensated heart failure, she was discharged to SNF and from there she was posteriorly discharged home. At home patient developed progressive dyspnea, lower extremity edema, 15 lbs weight gain and PND, that prompted her to come back to the hospital. On her initial physical examination her blood pressure was 126/114, HR 94, RR 26 and 02 saturation 94%, lungs with no wheezing or rhonchi, heart with S1 and S2 present with no gallops, rhythmic, abdomen not distended, positive +++ pitting bilateral lower extremity edema.  ? ?Na 135, K 4,8 Cl 101, bicarbonate 24, glucose 110 bun 33 cr 1,20 ?BNP 405  ?High sensitive troponin 54 and 48  ?Sars covid 19 negative ? ?Urine SG 1,010  ? ?Chest radiograph with cardiomegaly and bilateral hilar vascular congestion. ? ?EKG 93 bpm, left axis deviation, normal intervals, sinus rhythm with a noisy baseline, low voltage with no significant ST segment or T wave changes.  ? ?Patient was placed on furosemide for diuresis.  ?Overnight on second hospital day, developed hypotension, so was transferred to ICU.  Suspect this was diuresis plus medications with vasodilatory and anti-inotropic effects including opiates, seroquel and gabapentin.   ? ?Patient was placed on norepinephrine for hypotension with good toleration. ?Patient was transitioned to midordrine.  ?04/19 Right heart catheterization PCW 9, cardiac output 5,7 and index 2,9. (Fick).  ? ?Transfer to [facility] on 04/20  ? ?Patient with improved volume status, she will continue diuresis with torsemide  and spironolactone. ?Follow up as outpatient.  ? ?

## 2022-01-02 NOTE — Assessment & Plan Note (Addendum)
S/p CABG x4 10/27/2021.  She denies any chest pain.  Troponin minimally elevated and to rending down on repeat.  EKG without acute ischemic changes.   ? ?Patient ruled out for acute coronary syndrome, elevated troponin due to congested heart failure.  ?-Continue clopidogrel and rosuvastatin ? ?Dyslipidemia, continue with statin therapy.  ?

## 2022-01-02 NOTE — Assessment & Plan Note (Signed)
Continue rosuvastatin.  

## 2022-01-02 NOTE — Assessment & Plan Note (Addendum)
Depression.  ?Patient on bupropion, vortioxetine, duloxetine, carbamazepine and oxycodone with good toleration.  ?

## 2022-01-02 NOTE — Assessment & Plan Note (Addendum)
Patient was admitted to the cardiac ward and she was placed on furosemide IV for diuresis.  ?She developed hypotension and required transfer to the intensive care unit and placed on vasopressors.  ? ?Patient was placed on norepinephrine drip and furosemide drip.  ? ?04/17 discontinued norepinephrine.  ?04/19 discontinue furosemide drip.  ? ?Echocardiogram with preserved LV systolic function with EF 65 to 70%, preserved RV systolic function, moderate to severe tricuspid valve regurgitation.  ? ?Cardiac catheterization with PCWP of 9 with cardiac output 5.7 and index 2.9 (Fick).  ?PA mean 18  ? ?At the time of her discharge she has a negative fluid balance of 15,082 ml with significant improvement in her symptoms. ? ?Patient will continue heart failure medical therapy with midodrine, torsemide and spironolactone.  ?Holding on ARB/ Entresto and B blockade due to risk of hypotension.   ?Follow up as outpatient for further adjustments in medical therapy.  ? ? ?

## 2022-01-02 NOTE — Assessment & Plan Note (Signed)
Deconditioning/debility/ambulatory dysfunction ?Secondary to recent hospital stays and volume overload.  Request PT/OT eval. ?

## 2022-01-02 NOTE — Assessment & Plan Note (Addendum)
Continue anticoagulation with warfarin.  ?INR today is 1.6. ?Target INR  2 to 3.   ?

## 2022-01-03 ENCOUNTER — Inpatient Hospital Stay: Payer: Self-pay

## 2022-01-03 ENCOUNTER — Inpatient Hospital Stay (HOSPITAL_COMMUNITY): Payer: Medicare Other

## 2022-01-03 DIAGNOSIS — I952 Hypotension due to drugs: Secondary | ICD-10-CM

## 2022-01-03 DIAGNOSIS — G8929 Other chronic pain: Secondary | ICD-10-CM | POA: Diagnosis not present

## 2022-01-03 DIAGNOSIS — I2609 Other pulmonary embolism with acute cor pulmonale: Secondary | ICD-10-CM | POA: Diagnosis not present

## 2022-01-03 DIAGNOSIS — J9602 Acute respiratory failure with hypercapnia: Secondary | ICD-10-CM

## 2022-01-03 DIAGNOSIS — I2510 Atherosclerotic heart disease of native coronary artery without angina pectoris: Secondary | ICD-10-CM | POA: Diagnosis not present

## 2022-01-03 DIAGNOSIS — N179 Acute kidney failure, unspecified: Secondary | ICD-10-CM

## 2022-01-03 DIAGNOSIS — R5381 Other malaise: Secondary | ICD-10-CM

## 2022-01-03 DIAGNOSIS — I50813 Acute on chronic right heart failure: Secondary | ICD-10-CM | POA: Diagnosis not present

## 2022-01-03 DIAGNOSIS — G4733 Obstructive sleep apnea (adult) (pediatric): Secondary | ICD-10-CM

## 2022-01-03 DIAGNOSIS — J9601 Acute respiratory failure with hypoxia: Secondary | ICD-10-CM

## 2022-01-03 LAB — ECHOCARDIOGRAM COMPLETE
AR max vel: 1.97 cm2
AV Area VTI: 2.05 cm2
AV Area mean vel: 1.96 cm2
AV Mean grad: 10 mmHg
AV Peak grad: 18.1 mmHg
Ao pk vel: 2.13 m/s
Area-P 1/2: 4.06 cm2
S' Lateral: 3.3 cm
Weight: 4201.09 oz

## 2022-01-03 LAB — BASIC METABOLIC PANEL
Anion gap: 6 (ref 5–15)
BUN: 32 mg/dL — ABNORMAL HIGH (ref 8–23)
CO2: 27 mmol/L (ref 22–32)
Calcium: 8 mg/dL — ABNORMAL LOW (ref 8.9–10.3)
Chloride: 102 mmol/L (ref 98–111)
Creatinine, Ser: 1.13 mg/dL — ABNORMAL HIGH (ref 0.44–1.00)
GFR, Estimated: 51 mL/min — ABNORMAL LOW (ref >60)
Glucose, Bld: 83 mg/dL (ref 70–99)
Potassium: 5 mmol/L (ref 3.5–5.1)
Sodium: 135 mmol/L (ref 135–145)

## 2022-01-03 LAB — URINALYSIS, MICROSCOPIC (REFLEX): RBC / HPF: NONE SEEN RBC/hpf (ref 0–5)

## 2022-01-03 LAB — MAGNESIUM: Magnesium: 1.8 mg/dL (ref 1.7–2.4)

## 2022-01-03 LAB — LACTIC ACID, PLASMA
Lactic Acid, Venous: 1.3 mmol/L (ref 0.5–1.9)
Lactic Acid, Venous: 1.9 mmol/L (ref 0.5–1.9)

## 2022-01-03 LAB — MRSA NEXT GEN BY PCR, NASAL: MRSA by PCR Next Gen: NOT DETECTED

## 2022-01-03 LAB — URINALYSIS, ROUTINE W REFLEX MICROSCOPIC
Bilirubin Urine: NEGATIVE
Glucose, UA: NEGATIVE mg/dL
Hgb urine dipstick: NEGATIVE
Ketones, ur: NEGATIVE mg/dL
Nitrite: NEGATIVE
Protein, ur: NEGATIVE mg/dL
Specific Gravity, Urine: 1.01 (ref 1.005–1.030)
pH: 6.5 (ref 5.0–8.0)

## 2022-01-03 LAB — CBC
HCT: 28.8 % — ABNORMAL LOW (ref 36.0–46.0)
Hemoglobin: 9.1 g/dL — ABNORMAL LOW (ref 12.0–15.0)
MCH: 30 pg (ref 26.0–34.0)
MCHC: 31.6 g/dL (ref 30.0–36.0)
MCV: 95 fL (ref 80.0–100.0)
Platelets: 310 10*3/uL (ref 150–400)
RBC: 3.03 MIL/uL — ABNORMAL LOW (ref 3.87–5.11)
RDW: 14.9 % (ref 11.5–15.5)
WBC: 5.9 10*3/uL (ref 4.0–10.5)
nRBC: 0 % (ref 0.0–0.2)

## 2022-01-03 LAB — RESP PANEL BY RT-PCR (FLU A&B, COVID) ARPGX2
Influenza A by PCR: NEGATIVE
Influenza B by PCR: NEGATIVE
SARS Coronavirus 2 by RT PCR: NEGATIVE

## 2022-01-03 LAB — PROTIME-INR
INR: 1.9 — ABNORMAL HIGH (ref 0.8–1.2)
Prothrombin Time: 21.3 seconds — ABNORMAL HIGH (ref 11.4–15.2)

## 2022-01-03 LAB — AMMONIA: Ammonia: 29 umol/L (ref 9–35)

## 2022-01-03 MED ORDER — WARFARIN SODIUM 5 MG PO TABS
5.0000 mg | ORAL_TABLET | Freq: Once | ORAL | Status: AC
  Administered 2022-01-03: 5 mg via ORAL
  Filled 2022-01-03: qty 1

## 2022-01-03 MED ORDER — NOREPINEPHRINE 4 MG/250ML-% IV SOLN
0.0000 ug/min | INTRAVENOUS | Status: DC
  Administered 2022-01-03: 2 ug/min via INTRAVENOUS

## 2022-01-03 MED ORDER — WARFARIN - PHARMACIST DOSING INPATIENT
Freq: Every day | Status: DC
Start: 2022-01-03 — End: 2022-01-08

## 2022-01-03 MED ORDER — MIDODRINE HCL 5 MG PO TABS
5.0000 mg | ORAL_TABLET | Freq: Three times a day (TID) | ORAL | Status: DC
  Administered 2022-01-03 – 2022-01-07 (×10): 5 mg via ORAL
  Filled 2022-01-03 (×9): qty 1

## 2022-01-03 MED ORDER — CHLORHEXIDINE GLUCONATE CLOTH 2 % EX PADS
6.0000 | MEDICATED_PAD | Freq: Every day | CUTANEOUS | Status: DC
  Administered 2022-01-03 – 2022-01-08 (×6): 6 via TOPICAL

## 2022-01-03 MED ORDER — ALBUMIN HUMAN 25 % IV SOLN
25.0000 g | Freq: Once | INTRAVENOUS | Status: AC
  Administered 2022-01-03: 25 g via INTRAVENOUS
  Filled 2022-01-03: qty 100

## 2022-01-03 MED ORDER — ORAL CARE MOUTH RINSE
15.0000 mL | Freq: Two times a day (BID) | OROMUCOSAL | Status: DC
  Administered 2022-01-03 – 2022-01-08 (×10): 15 mL via OROMUCOSAL

## 2022-01-03 MED ORDER — SODIUM CHLORIDE 0.9% FLUSH
10.0000 mL | Freq: Two times a day (BID) | INTRAVENOUS | Status: DC
  Administered 2022-01-03 – 2022-01-06 (×6): 10 mL
  Administered 2022-01-07: 20 mL
  Administered 2022-01-08: 10 mL

## 2022-01-03 MED ORDER — NOREPINEPHRINE 4 MG/250ML-% IV SOLN
2.0000 ug/min | INTRAVENOUS | Status: DC
  Filled 2022-01-03: qty 250

## 2022-01-03 MED ORDER — DEXTROSE 5 % IV SOLN
7.5000 mg/h | INTRAVENOUS | Status: DC
  Administered 2022-01-03 – 2022-01-05 (×3): 15 mg/h via INTRAVENOUS
  Administered 2022-01-05 – 2022-01-06 (×2): 7.5 mg/h via INTRAVENOUS
  Filled 2022-01-03 (×5): qty 20

## 2022-01-03 MED ORDER — OXYCODONE-ACETAMINOPHEN 5-325 MG PO TABS
1.0000 | ORAL_TABLET | Freq: Once | ORAL | Status: AC
  Administered 2022-01-03: 1 via ORAL
  Filled 2022-01-03: qty 1

## 2022-01-03 MED ORDER — SODIUM CHLORIDE 0.9 % IV SOLN
250.0000 mL | INTRAVENOUS | Status: DC
  Administered 2022-01-07: 250 mL via INTRAVENOUS

## 2022-01-03 MED ORDER — SODIUM CHLORIDE 0.9% FLUSH: 10.0000 mL | INTRAVENOUS | Status: DC | PRN

## 2022-01-03 MED ORDER — MAGNESIUM SULFATE 2 GM/50ML IV SOLN
2.0000 g | Freq: Once | INTRAVENOUS | Status: AC
  Administered 2022-01-03: 2 g via INTRAVENOUS
  Filled 2022-01-03: qty 50

## 2022-01-03 MED ORDER — WARFARIN SODIUM 5 MG PO TABS
5.0000 mg | ORAL_TABLET | ORAL | Status: AC
Start: 2022-01-03 — End: 2022-01-03
  Administered 2022-01-03: 5 mg via ORAL
  Filled 2022-01-03: qty 1

## 2022-01-03 NOTE — Consult Note (Signed)
?  ?Advanced Heart Failure Team Consult Note ? ? ?Primary Physician: Rich Fuchs, PA ?PCP-Cardiologist:  Larae Grooms, MD ? ?Reason for Consultation: RV failure/hypotension ? ?HPI:   ? ?Deborah Cooke is seen today for evaluation of RV failure/hypotension at the request of Dr. Carlis Abbott.  ? ?Deborah Cooke is a 71 y.o. female with morbid obesity s/p gastric bypass, cor pulmonale, CAD s/p CABG 2/23. ? ?Admitted 2/23 with inferior STEMI c/b CHB requiring temporary pacer.  She was not a candidate for PCI given LM disease and was felt to be high risk for CABG due to morbid obesity.  She ultimately underwent CABG with cryopreserved allograft due to poor vein access.  Her post op course was c/b AF which converted to NSR on Amiodarone, ileus and suspected pneumonia tx with antibiotics. ?     ?She was readmitted 3/22 with small segmental RUL and RLL pulmonary emboli.  [place] was neg for DVT. PE was felt to be provoked post op.  Pt was placed on IV heparin and transitioned to Coumadin (unable to use DOAC due to Rx with Carbamazepine).  Echocardiogram in Feb 2023 at the time of her MI demonstrated cor pulmonale and mild pulmonary hypertension and she had difficult to control LE edema felt to be due to R CHF.  She was diuresed with IV Lasix.  OP sleep study was suggested by the IP Cardiology team. Discharged to SNF.  ? ?Did poorly after discharge from SNF rehab post-hospitalization. Gained 15 pounds.  Her son reports that she has recent memory impairment for the past few months and he is concern for dementia. ?  ?On 4/12 she had a fall at home due to tripping when walking with her walker.  She fell and hit her head on concrete.  She is chronically on Coumadin.  In the ED that day and discharged home.  Mobility has worsened since her fall.  ? ?Now admitted with progressive weakness, hypotension and massive volume overload. Moved to ICU and started on NE.  ? ?Review of Systems: [y] = yes, '[ ]'$  = no  ? ?General: Weight gain [ y];  Weight loss '[ ]'$ ; Anorexia '[ ]'$ ; Fatigue [ y]; Fever '[ ]'$ ; Chills '[ ]'$ ; Weakness [ y]  ?Cardiac: Chest pain/pressure '[ ]'$ ; Resting SOB [ y]; Exertional SOB [ y]; Orthopnea '[ ]'$ ; Pedal Edema Blue.Reese ]; Palpitations '[ ]'$ ; Syncope '[ ]'$ ; Presyncope '[ ]'$ ; Paroxysmal nocturnal dyspnea'[ ]'$   ?Pulmonary: Cough '[ ]'$ ; Wheezing'[ ]'$ ; Hemoptysis'[ ]'$ ; Sputum '[ ]'$ ; Snoring Blue.Reese ]  ?GI: Vomiting'[ ]'$ ; Dysphagia'[ ]'$ ; Melena'[ ]'$ ; Hematochezia '[ ]'$ ; Heartburn'[ ]'$ ; Abdominal pain '[ ]'$ ; Constipation '[ ]'$ ; Diarrhea '[ ]'$ ; BRBPR '[ ]'$   ?GU: Hematuria'[ ]'$ ; Dysuria '[ ]'$ ; Nocturia'[ ]'$   ?Vascular: Pain in legs with walking '[ ]'$ ; Pain in feet with lying flat '[ ]'$ ; Non-healing sores '[ ]'$ ; Stroke '[ ]'$ ; TIA '[ ]'$ ; Slurred speech '[ ]'$ ;  ?Neuro: Headaches[ y]; Vertigo'[ ]'$ ; Seizures'[ ]'$ ; Paresthesias'[ ]'$ ;Blurred vision '[ ]'$ ; Diplopia '[ ]'$ ; Vision changes '[ ]'$   ?Ortho/Skin: Arthritis Blue.Reese ]; Joint pain Blue.Reese ]; Muscle pain '[ ]'$ ; Joint swelling Blue.Reese ]; Back Pain [ y]; Rash '[ ]'$   ?Psych: Depression[ y]; Anxiety[ y]  ?Heme: Bleeding problems '[ ]'$ ; Clotting disorders '[ ]'$ ; Anemia [ y]  ?Endocrine: Diabetes '[ ]'$ ; Thyroid dysfunction'[ ]'$  ? ?Home Medications ?Prior to Admission medications   ?Medication Sig Start Date End Date Taking? Authorizing Provider  ?acetaminophen (TYLENOL) 325 MG tablet Take 2 tablets (650  mg total) by mouth every 6 (six) hours as needed for mild pain or moderate pain. 11/11/21  Yes Roddenberry, Arlis Porta, PA-C  ?anastrozole (ARIMIDEX) 1 MG tablet TAKE 1 TABLET BY MOUTH  DAILY ?Patient taking differently: Take 1 mg by mouth daily. 08/21/21  Yes Truitt Merle, MD  ?B Complex Vitamins (B COMPLEX 100 PO) Take 1 tablet by mouth daily.   Yes [provider]  ?Biotin 10000 MCG TABS Take 10,000 mcg by mouth daily.   Yes [provider]  ?buPROPion (WELLBUTRIN XL) 300 MG 24 hr tablet Take 300 mg by mouth daily. 06/09/21  Yes [provider]  ?Calcium Carbonate (CALCIUM 600 PO) Take 600 mg by mouth in the morning and at bedtime.   Yes [provider]  ?carbamazepine (TEGRETOL  XR) 200 MG 12 hr tablet Take 400 mg by mouth 2 (two) times daily. 07/26/18 01/02/22 Yes [provider]  ?cholecalciferol (VITAMIN D) 400 units TABS tablet Take 400 Units by mouth daily.   Yes [provider]  ?clopidogrel (PLAVIX) 75 MG tablet Take 1 tablet (75 mg total) by mouth daily. Do not stop unless directed by cardiology or cardiothoracic surgery. 12/15/21  Yes Weaver, Scott T, PA-C  ?cyclobenzaprine (FLEXERIL) 10 MG tablet Take 1 tablet (10 mg total) by mouth 3 (three) times daily as needed for muscle spasms. ?Patient taking differently: Take 10 mg by mouth 3 (three) times daily. 10/17/20  Yes McClung, Sarah A, PA-C  ?DULoxetine (CYMBALTA) 30 MG capsule Take 30 mg by mouth 2 (two) times daily.   Yes [provider]  ?ferrous sulfate 325 (65 FE) MG EC tablet Take 325 mg by mouth daily with breakfast.   Yes [provider]  ?furosemide (LASIX) 40 MG tablet Take 1 tablet (40 mg total) by mouth daily. 12/22/21  Yes Weaver, Scott T, PA-C  ?gabapentin (NEURONTIN) 800 MG tablet Take 800 mg by mouth 4 (four) times daily. 12/25/21  Yes [provider]  ?hydrOXYzine (ATARAX) 50 MG tablet Take 50 mg by mouth 3 (three) times daily. 12/06/21  Yes [provider]  ?latanoprost (XALATAN) 0.005 % ophthalmic solution Place 1 drop into both eyes at bedtime. 06/25/16  Yes [provider]  ?levothyroxine (SYNTHROID) 88 MCG tablet Take 88 mcg by mouth daily before breakfast. Also take liothyronine 25   Yes [provider]  ?liothyronine (CYTOMEL) 25 MCG tablet Take 25 mcg by mouth daily. Also take levothyroxine 88 10/28/21  Yes [provider]  ?metoprolol succinate (TOPROL-XL) 25 MG 24 hr tablet Take 0.5 tablets (12.5 mg total) by mouth daily. 12/15/21  Yes Richardson Dopp T, PA-C  ?Multiple Vitamins-Iron (MULTIVITAMIN/IRON PO) Take 1 tablet by mouth daily.    Yes [provider]  ?omeprazole (PRILOSEC) 20 MG capsule Take 20 mg by mouth every evening.  12/06/21  Yes [provider]  ?oxyCODONE-acetaminophen (PERCOCET) 10-325 MG tablet Take 1 tablet by mouth every 8 (eight) hours as needed for pain. ?Patient taking differently: Take 1 tablet by mouth every 6 (six) hours as needed for pain. 12/01/21  Yes Pokhrel, Laxman, MD  ?pantoprazole (PROTONIX) 40 MG tablet Take 1 tablet (40 mg total) by mouth daily. 11/12/21  Yes Roddenberry, Arlis Porta, PA-C  ?polyethylene glycol (MIRALAX / GLYCOLAX) 17 g packet Take 17 g by mouth 2 (two) times daily. ?Patient taking differently: Take 17 g by mouth daily. 10/21/20  Yes Patrecia Pour, MD  ?potassium chloride SA (KLOR-CON M) 20 MEQ tablet Take 1 tablet (20 mEq  total) by mouth daily. 12/15/21  Yes Weaver, Scott T, PA-C  ?QUEtiapine (SEROQUEL) 300 MG tablet Take 300 mg by mouth at bedtime.   Yes [provider]  ?rosuvastatin (CRESTOR) 10 MG tablet Take 1 tablet (10 mg total) by mouth daily. 12/15/21  Yes Weaver, Scott T, PA-C  ?simethicone (MYLICON) 80 MG chewable tablet Chew 80 mg by mouth every 6 (six) hours as needed for flatulence.   Yes [provider]  ?TRINTELLIX 20 MG TABS tablet Take 20 mg by mouth at bedtime. 11/24/21  Yes [provider]  ?warfarin (COUMADIN) 3 MG tablet Take 1 to 1.5 tablets by mouth once daily as directed by anticoagulation clinic ?Patient taking differently: Take 3-4.5 mg by mouth See admin instructions. 4.5 mg Tuesday and Thursday ?3 mg Monday,Wednesday,Friday,Saturday and sunday 12/30/21  Yes Jettie Booze, MD  ?enoxaparin (LOVENOX) 120 MG/0.8ML injection Inject 0.74 mLs (110 mg total) into the skin every 12 (twelve) hours for 3 days. Until INR between 2-3 ?Patient not taking: Reported on 01/02/2022 12/01/21 12/04/21  Flora Lipps, MD  ?gabapentin (NEURONTIN) 100 MG capsule Take 2 capsules (200 mg total) by mouth 3 (three) times daily. ?Patient not taking: Reported on 01/02/2022 11/11/21   Antony Odea, PA-C  ? ? ?Past Medical History: ?Past Medical History:   ?Diagnosis Date  ? Adverse effect of anesthetic 01/2012  ? difficulty waking up.  ? Adverse effect of unspecified anesthetic, initial encounter 08/2011  ? low o2 sats.  ? Anemia   ? Anxiety   ? Arthritis

## 2022-01-03 NOTE — Progress Notes (Addendum)
eLink Physician-Brief Progress Note ?Patient Name: Deborah Cooke ?DOB: 04/15/1951 ?MRN: [id number] ? ? ?Date of Service ? 01/03/2022  ?HPI/Events of Note ? Patient c/o pain and requests home Percocet and Seroquel. PCCM day rounding team trying to avoid sedating medications.   ?eICU Interventions ? Plan: ?Percocet 5/325 1 tab PO X 1 now.   ? ? ? ?Intervention Category ?Major Interventions: Other: ? ?Modelle Vollmer Cornelia Copa ?01/03/2022, 11:14 PM ?

## 2022-01-03 NOTE — Consult Note (Signed)
? ?NAME:  Deborah Cooke, MRN:  [id number], DOB:  1951/08/23, LOS: 1 ?ADMISSION DATE:  01/02/2022, CONSULTATION DATE: January 03, 2022 ?REFERRING MD: Hospitalist service, CHIEF COMPLAINT: Low blood pressure ? ?History of Present Illness:  ?Patient 71 year old female with past medical history of coronary artery disease CABG PE on Coumadin hypothyroidism who presented with severe volume anasarca volume overload dyspnea on exertion she was given a dose of Lasix she was diuresing very well in the ED later on in the course patient received all of her medications including gabapentin 300 mg of Seroquel oxycodone later on in the course she became hypotensive she was given some fluids back with no improvement she was put on pressors for systolic blood pressure of 60.  Patient had no fever chills rigors no nausea vomiting diarrhea ?Objective   ?Blood pressure (!) 68/49, pulse 75, temperature 98.4 ?F (36.9 ?C), temperature source Oral, resp. rate (!) 25, SpO2 (!) 83 %. ?   ?   ?No intake or output data in the 24 hours ending 01/03/22 0418 ?There were no vitals filed for this visit. ? ?Examination: ?General:  NAD , pleasant and hungry , wants to eat . ?Neuro:  WNL , AOX3 , EOMI , CN II-XII intact , UL , LL strength is symmetrical and 5/5 ?HEENT:  atraumatic , no jaundice , dry mucous membranes  ?Cardiovascular:  Irregular irregular , ESM 2/6 in the aortic area  ?Lungs:  CTA bilateral , no wheezing or crackles  ?Abdomen:  Soft lax +BS , no tenderness . ?Musculoskeletal:  WNL , normal pulses  ?Skin:  No rash   ? ? ?Assessment & Plan:  ? ? ?-- Hypotension and multifactorial most likely due to diuresis-overdiuresis with polypharmacy with medications include inotropic effect vasodilation effect I DC'd most of those medications today the patient approach including the gabapentin and Seroquel with narcotics ? ? ?-- Anasarca volume overload due to diastolic dysfunction acute at this point we will hold Lasix till blood pressure improves and  resume diuresis after that. ? ?--I suspect this episode would be transient given the nature of the polypharmacy but from how low the blood pressure is we will start the patient on Levophed. ? ?Labs   ?CBC: ?Recent Labs  ?Lab 12/30/21 ?1640 01/02/22 ?1813  ?WBC 6.6 6.6  ?NEUTROABS 4.3  --   ?HGB 11.3* 11.0*  ?HCT 36.0 34.6*  ?MCV 97.0 96.1  ?PLT 310 372  ? ? ?Basic Metabolic Panel: ?Recent Labs  ?Lab 12/30/21 ?1640 01/02/22 ?1813  ?NA 137 135  ?K 4.9 4.8  ?CL 103 101  ?CO2 24 24  ?GLUCOSE 94 110*  ?BUN 22 33*  ?CREATININE 0.84 1.20*  ?CALCIUM 8.7* 8.8*  ? ?GFR: ?Estimated Creatinine Clearance: 45 mL/min (A) (by C-G formula based on SCr of 1.2 mg/dL (H)). ?Recent Labs  ?Lab 12/30/21 ?1640 01/02/22 ?1813  ?WBC 6.6 6.6  ? ? ?Liver Function Tests: ?Recent Labs  ?Lab 01/02/22 ?2140  ?AST 33  ?ALT 14  ?ALKPHOS 121  ?BILITOT 1.0  ?PROT 5.4*  ?ALBUMIN 2.8*  ? ?No results for input(s): LIPASE, AMYLASE in the last 168 hours. ?No results for input(s): AMMONIA in the last 168 hours. ? ?ABG ?   ?Component Value Date/Time  ? PHART 7.44 11/26/2021 2342  ? PCO2ART 47 11/26/2021 2342  ? PO2ART 91 11/26/2021 2342  ? HCO3 32.0 (H) 11/26/2021 2342  ? TCO2 32 11/02/2021 0347  ? ACIDBASEDEF 1.0 10/28/2021 0036  ? O2SAT 96.3 11/26/2021 2342  ?  ? ?  Coagulation Profile: ?Recent Labs  ?Lab 12/30/21 ?0000 12/30/21 ?1640 01/02/22 ?1813  ?INR 2.6 2.3* 1.9*  ? ? ?Cardiac Enzymes: ?No results for input(s): CKTOTAL, CKMB, CKMBINDEX, TROPONINI in the last 168 hours. ? ?HbA1C: ?Hgb A1c MFr Bld  ?Date/Time Value Ref Range Status  ?10/26/2021 10:40 PM 5.3 4.8 - 5.6 % Final  ?  Comment:  ?  (NOTE) ?Pre diabetes:          5.7%-6.4% ? ?Diabetes:              >6.4% ? ?Glycemic control for   <7.0% ?adults with diabetes ?  ? ? ?CBG: ?No results for input(s): GLUCAP in the last 168 hours. ? ?Review of Systems:   ?Unable to obtain due to patient condition. ? ?Past Medical History:  ?She,  has a past medical history of Adverse effect of anesthetic (01/2012),  Adverse effect of unspecified anesthetic, initial encounter (08/2011), Anemia, Anxiety, Arthritis, Breast cancer ([place]) (02/16/2017), Chronic cor pulmonale ([place]) (12/14/2021), Chronic pain, Closed supracondylar fracture of right femur ([place]) (09/18/2012), Depression, Family history of breast cancer, GERD (gastroesophageal reflux disease), Headache(784.0), Hypertension, Hypothyroidism, Opioid dependence in controlled environment ([place]), Osteoporosis, Osteoporosis with fracture (09/19/2012), Pneumonia, and Thyroid disease.  ? ?Surgical History:  ? ?Past Surgical History:  ?Procedure Laterality Date  ?  OPEN REDUCTION INTERNAL FIXATION (ORIF) DISTAL FEMUR FRACTURE (Left Leg Upper)  10/15/2020  ? BACK SURGERY    ? fusion mid back, lumbar and thoracic  ? BREAST BIOPSY Left 02/16/2017  ? BREAST LUMPECTOMY WITH RADIOACTIVE SEED AND SENTINEL LYMPH NODE BIOPSY Left 03/15/2017  ? Procedure: LEFT BREAST LUMPECTOMY WITH RADIOACTIVE SEED AND LEFT AXILLARY SENTINEL LYMPH NODE BIOPSY;  Surgeon: [name] Overall, MD;  Location: [place];  Service: General;  Laterality: Left;  ? CHOLECYSTECTOMY  10/1975  ? CORONARY ARTERY BYPASS GRAFT N/A 10/27/2021  ? Procedure: CORONARY ARTERY BYPASS GRAFTING (CABG) TIMES FOUR, USING LEFT INTERNAL MAMMARY ARTERY AND RIGHT LEG GREATER SAPHENOUS VEIN HARVESTED ENDOSCOPICALLY AND RIGHT CORONARY THROMBECTOMY;  Surgeon: Dahlia Byes, MD;  Location: [place];  Service: Open Heart Surgery;  Laterality: N/A;  ? CORONARY/GRAFT ACUTE MI REVASCULARIZATION N/A 10/26/2021  ? Procedure: Coronary/Graft Acute MI Revascularization;  Surgeon: Jettie Booze, MD;  Location: [place] [facility];  Service: Cardiovascular;  Laterality: N/A;  ? ENDOVEIN HARVEST OF GREATER SAPHENOUS VEIN Right 10/27/2021  ? Procedure: ENDOVEIN HARVEST OF GREATER SAPHENOUS VEIN;  Surgeon: Dahlia Byes, MD;  Location: [place];  Service: Open Heart Surgery;  Laterality: Right;  ? FEMUR IM NAIL  09/18/2012  ? Procedure:  INTRAMEDULLARY (IM) RETROGRADE FEMORAL NAILING;  Surgeon: [name], MD;  Location: [place];  Service: Orthopedics;  Laterality: Right;  ? GASTRIC BYPASS  2003  ? IABP INSERTION N/A 10/26/2021  ? Procedure: IABP Insertion;  Surgeon: Jettie Booze, MD;  Location: [place] [facility];  Service: Cardiovascular;  Laterality: N/A;  ? JOINT REPLACEMENT    ? LEFT HEART CATH AND CORONARY ANGIOGRAPHY N/A 10/26/2021  ? Procedure: LEFT HEART CATH AND CORONARY ANGIOGRAPHY;  Surgeon: Jettie Booze, MD;  Location: [place] [facility];  Service: Cardiovascular;  Laterality: N/A;  ? NECK SURGERY  2000  ? ORIF FEMUR FRACTURE Left 10/15/2020  ? Procedure: OPEN REDUCTION INTERNAL FIXATION (ORIF) DISTAL FEMUR FRACTURE;  Surgeon: Shona Needles, MD;  Location: [place];  Service: Orthopedics;  Laterality: Left;  ? REPLACEMENT TOTAL KNEE  2003  ? right and left -4 months aoart.  ? REVERSE SHOULDER ARTHROPLASTY  Right 10/20/2021  ? Procedure: REVERSE SHOULDER ARTHROPLASTY;  Surgeon: Marchia Bond, MD;  Location: [facility];  Service: Orthopedics;  Laterality: Right;  ? TEE WITHOUT CARDIOVERSION N/A 10/27/2021  ? Procedure: TRANSESOPHAGEAL ECHOCARDIOGRAM (TEE);  Surgeon: Dahlia Byes, MD;  Location: [place];  Service: Open Heart Surgery;  Laterality: N/A;  ? TEMPORARY PACEMAKER N/A 10/26/2021  ? Procedure: TEMPORARY PACEMAKER;  Surgeon: Jettie Booze, MD;  Location: [place] [facility];  Service: Cardiovascular;  Laterality: N/A;  ? TONSILLECTOMY    ? WRIST FRACTURE SURGERY Bilateral 08/2011  ?  ? ?Social History:  ? reports that she quit smoking about 32 years ago. Her smoking use included cigarettes. She has a 28.00 pack-year smoking history. She quit smokeless tobacco use about 32 years ago. She reports current alcohol use. She reports that she does not use drugs.  ? ?Family History:  ?Her family history includes Breast cancer (age of onset: 2) in her mother; Cancer (age of onset: 58) in her father.  ? ?Allergies ?Allergies   ?Allergen Reactions  ? Nsaids Nausea And Vomiting and Other (See Comments)  ?  GI Upset (ibuprofen included)  ? Ibuprofen Other (See Comments)  ?  Other reaction(s): Abdominal Pain  ? Tolmetin Other (See Comments)

## 2022-01-03 NOTE — Progress Notes (Signed)
Patient arrived to unit. CCM notified. Levo at 2 mcg. 8LPM venturi mask. UA collected and sent. COVID and MRSA screening completed. CHG bath given. Call bell within reach of patient. All patient needs addressed. Bed in lowest position.  ?

## 2022-01-03 NOTE — ED Notes (Signed)
Report given to Ubaldo Glassing, RN [facility]). ?

## 2022-01-03 NOTE — Progress Notes (Signed)
?  Echocardiogram ?2D Echocardiogram has been performed. ? ?Deborah Cooke ?01/03/2022, 4:06 PM ?

## 2022-01-03 NOTE — Progress Notes (Signed)
? ?NAME:  Deborah Cooke, MRN:  [id number], DOB:  January 16, 1951, LOS: 1 ?ADMISSION DATE:  01/02/2022, CONSULTATION DATE: January 03, 2022 ?REFERRING MD: Hospitalist service, CHIEF COMPLAINT: Low blood pressure ? ?History of Present Illness:  ?Patient 71 year old female with past medical history of coronary artery disease CABG PE on Coumadin hypothyroidism who presented with severe volume anasarca volume overload dyspnea on exertion she was given a dose of Lasix she was diuresing very well in the ED later on in the course patient received all of her medications including gabapentin 300 mg of Seroquel oxycodone later on in the course she became hypotensive she was given some fluids back with no improvement she was put on pressors for systolic blood pressure of 60.  Patient had no fever chills rigors no nausea vomiting diarrhea.  Shortness of breath x10 days.  Had a STEMI with cardiogenic shock requiring IABP, severe 3-vessel disease > CABG x4 in February 2023. She had IABP removed POD4, inotropes weaned subsequently. She was diagnosed with PE in early March and acute heart failure in March as well.  Had issues with low BPs and difficulty tolerating diuresis-- only tolerating metoprolol succ 12.'5mg'$  daily. 15 pound weight gain in the last week. Did poorly after discharge from SNF rehab post-hospitalization.  Her son reports that she has recent memory impairment for the past few months and he is concern for dementia. ? ?On 4/12 she had a fall at home due to tripping when walking with her walker.  She fell and hit her head on concrete.  She is chronically on Coumadin.  In the ED that day and discharged home.  Mobility has worsened since her fall. ? ?119 kg at admission, was 106kg on 3/28. ? ?PMH:  ?Breast cancer in 2018 ?Chronic cor pulmonale ?PE March 2023- on coumadin ?GERD ?Hypertension ?Hypothyroidism ?Osteoporosis ?Chronic opiate use ? ?Hospital course:  ?4/15 admitted, upgraded to ICU overnight ? ? ?Interim history:  ?Today  she denies complaints. Breathing stable. Remains on low dose NE. ? ?Objective   ?Blood pressure (!) 80/51, pulse 84, temperature 97.6 ?F (36.4 ?C), temperature source Oral, resp. rate 16, weight 119.1 kg, SpO2 100 %. ?   ?   ? ?Intake/Output Summary (Last 24 hours) at 01/03/2022 0942 ?Last data filed at 01/03/2022 0700 ?Gross per 24 hour  ?Intake 86.85 ml  ?Output --  ?Net 86.85 ml  ? ?Filed Weights  ? 01/03/22 0500  ?Weight: 119.1 kg  ? ? ?Examination: ?General: Chronically ill-appearing man lying in bed in NAD ?Neuro:  sleeping but easily arousable, globally weak, asterixis ?HEENT: [place]/AT, eyes anicteric, edentulous ?Cardiovascular: S1-S2, regular rate and rhythm ?Lungs: Distant lung sounds, no wheezing.  No conversational dyspnea.   ?Abdomen: Obese, soft, nontender ?Musculoskeletal: Pitting edema bilateral lower extremities, no cyanosis ?Skin: Well-healed scars, no rashes ? ?7.44/47/91/32 ?BUN 32 ?Creatinine 1.13 ?BNP 405.8 ?WBC 5.9 ?H/H 9.1/28.8 ?UA: Large number of leukocytes, 0-5 WBC ? ?Assessment & Plan:  ?Hypotension, concern this is mostly due to chronic right heart failure, possibly also left heart failure.  Polypharmacy contributing. ?- Aggressive treatment of right heart failure.  Diuresis with Lasix infusion. ?-Echocardiogram ?- Hold metoprolol ?- Start midodrine 5 mg 3 times daily ?-Unna boots ?- Wean off norepinephrine as able to maintain SPO2 greater than 90%. ?-Planning for right heart catheterization when euvolemic.  Appreciate cardiology's management. ?- Nocturnal BiPAP; needs outpatient sleep study and NIPPV ?-PICC line, Cooximetry ?- Needs to prioritize long-term weight loss as a goal.  ? ?Acute hypoxic and  hypercapnic respiratory failure, likely chronic hypercapnia due to OHS, OSA, possibly also acute pulmonary edema. ?Recent PE ?- Supplemental oxygen to maintain SPO2 greater than 88%. ?- Anticipate she needs nocturnal noninvasive positive pressure ventilation, needs outpatient sleep study. ?-  Long-term weight loss. ?- Diuresis ?-Continue Coumadin, dosing per pharmacy.  Cannot tolerate DOAC with carbamazepine. ? ?CAD, s/p CABG in Feb 2023 ?-Repeat echo ?- Needs Plavix indefinitely due to cryo graft ?- Aspirin, statin ?- Holding metoprolol due to hypotension; has generally been intolerant to beta-blockers from hypotension at home. ? ?Acute encephalopathy; some concern for dementia with recent memory loss over the last few months at home ?- Check ammonia level ?- Avoid sedating medications ? ?AKI, likely cardiorenal ?-Strict I's/O ?- Renally dose meds and avoid nephrotoxic meds ?- Diuresis ?- Maintain adequate renal perfusion. ? ?Anemia due to chronic disease ?-Transfuse for hemoglobin less than 7 or hemodynamically significant bleeding ?- Continue to monitor ? ?Deconditioning ?- Needs aggressive PT, OT. ?- Had a discussion at bedside with patient and her son that she is not safe to live at home currently. ? ? ?Son updated during bedside rounds with Cardiology and RN present. ? ?Labs   ?CBC: ?Recent Labs  ?Lab 12/30/21 ?1640 01/02/22 ?1813 01/03/22 ?0430  ?WBC 6.6 6.6 5.9  ?NEUTROABS 4.3  --   --   ?HGB 11.3* 11.0* 9.1*  ?HCT 36.0 34.6* 28.8*  ?MCV 97.0 96.1 95.0  ?PLT 310 372 310  ? ? ? ?Basic Metabolic Panel: ?Recent Labs  ?Lab 12/30/21 ?1640 01/02/22 ?1813 01/03/22 ?0430  ?NA 137 135 135  ?K 4.9 4.8 5.0  ?CL 103 101 102  ?CO2 '24 24 27  '$ ?GLUCOSE 94 110* 83  ?BUN 22 33* 32*  ?CREATININE 0.84 1.20* 1.13*  ?CALCIUM 8.7* 8.8* 8.0*  ?MG  --   --  1.8  ? ? ?GFR: ?Estimated Creatinine Clearance: 51.5 mL/min (A) (by C-G formula based on SCr of 1.13 mg/dL (H)). ?Recent Labs  ?Lab 12/30/21 ?1640 01/02/22 ?1813 01/03/22 ?0430 01/03/22 ?0745  ?WBC 6.6 6.6 5.9  --   ?LATICACIDVEN  --   --  1.3 1.9  ? ? ? ?Liver Function Tests: ?Recent Labs  ?Lab 01/02/22 ?2140  ?AST 33  ?ALT 14  ?ALKPHOS 121  ?BILITOT 1.0  ?PROT 5.4*  ?ALBUMIN 2.8*  ? ? ?No results for input(s): LIPASE, AMYLASE in the last 168 hours. ?No results for  input(s): AMMONIA in the last 168 hours. ? ?ABG ?   ?Component Value Date/Time  ? PHART 7.44 11/26/2021 2342  ? PCO2ART 47 11/26/2021 2342  ? PO2ART 91 11/26/2021 2342  ? HCO3 32.0 (H) 11/26/2021 2342  ? TCO2 32 11/02/2021 0347  ? ACIDBASEDEF 1.0 10/28/2021 0036  ? O2SAT 96.3 11/26/2021 2342  ? ?  ? ?Coagulation Profile: ?Recent Labs  ?Lab 12/30/21 ?0000 12/30/21 ?1640 01/02/22 ?1813  ?INR 2.6 2.3* 1.9*  ? ? ? ?This patient is critically ill with multiple organ system failure which requires frequent high complexity decision making, assessment, support, evaluation, and titration of therapies. This was completed through the application of advanced monitoring technologies and extensive interpretation of multiple databases. During this encounter critical care time was devoted to patient care services described in this note for 60 minutes. ? ?Julian Hy, DO 01/03/22 11:05 AM ?[facility] ? ?  ?

## 2022-01-03 NOTE — Progress Notes (Signed)
ANTICOAGULATION CONSULT NOTE - Follow Up Consult ? ?Pharmacy Consult for Warfarin ?Indication: pulmonary embolus - recent 3/23 ? ?Allergies  ?Allergen Reactions  ? Nsaids Nausea And Vomiting and Other (See Comments)  ?  GI Upset (ibuprofen included)  ? Ibuprofen Other (See Comments)  ?  Other reaction(s): Abdominal Pain  ? Tolmetin Other (See Comments)  ?  Other reaction(s): Abdominal Pain ?  ? ? ?Patient Measurements: ?  ? ? ?Vital Signs: ?Temp: 98.4 ?F (36.9 ?C) (04/15 1809) ?Temp Source: Oral (04/15 1809) ?BP: 119/61 (04/15 2100) ?Pulse Rate: 102 (04/15 2045) ? ?Labs: ?Recent Labs  ?  01/02/22 ?1813 01/02/22 ?2140  ?HGB 11.0*  --   ?HCT 34.6*  --   ?PLT 372  --   ?LABPROT 21.8*  --   ?INR 1.9*  --   ?CREATININE 1.20*  --   ?TROPONINIHS 54* 48*  ? ? ?Estimated Creatinine Clearance: 45 mL/min (A) (by C-G formula based on SCr of 1.2 mg/dL (H)). ? ? ?Assessment: ?71 year old female admitted with dyspnea to continue warfarin for PE diagnosed in March.   ?INR on admission slightly low at 1.9 ?Dose PTA - > 4.5 mg Tuesday and Thursday, 3 mg MWFSS ? ?Goal of Therapy:  ?INR 2-3 ?Monitor platelets by anticoagulation protocol: Yes ?  ?Plan:  ?Warfarin 5 mg po x 1 dose now ?Daily INR ? ?Thank you ?Anette Guarneri, PharmD ? ?Tad Moore ?01/03/2022,12:08 AM ? ? ?

## 2022-01-03 NOTE — Progress Notes (Addendum)
eLink Physician-Brief Progress Note ?Patient Name: Deborah Cooke ?DOB: September 14, 1951 ?MRN: [id number] ? ? ?Date of Service ? 01/03/2022  ?HPI/Events of Note ? Brief new admit note: ?Moved from floor to ICU for hypotension on levophed gtt. ?Admitted yesterday to floor for CHF exacerbation. AHRF.  ? ?71 y.o. female with medical history significant for CAD s/p CABG x4 10/27/2021, PE on Coumadin, chronic cor pulmonale (TTE 10/27/2021 shows EF 60-65%, G2DD), HTN, hypothyroidism, bipolar 2 disorder, depression/anxiety, chronic pain  ?Recent admission for PE, on wraf ? ?Camera: ?Discussed with RN team ?HR 88. Alert and awake. Morbidly obese. On o2.  ?Levo at 57mg/min. ?Leg edema. No obvious reddness seen. ? ?Data: ?Reviewed ?Cr 1.3, up from 1.2, K 5 ?Hg 9.1 from 11. No bleeding ?Wbc normal ?INR 1.9 ?EKG: reviewed. Normal sinus, qtc 429. PVC's. Low voltage criteria. No alternans. Non specific changes ?CxR CABG wires. Cardiomegaly. Mild chf. ?RVP just sent. ? ?Shock from Cor pulmonale. Diuresis related decrease in pre load vs third spacing. Not suspecting any sepsis.  PE -recent. Do not have tachycardia. INR 1.8 ? ?CAD/S/P CABG. CHF/leg edema. OSA/OHS- likely have it.  ? ? ?  ?eICU Interventions ? - hold diuresis, HTN meds. S/p albumin once received stat already. ?- on levophed gtt. ?- VTE: on warf. Anemia. Watch for any bleeding ?- asp precautions ?Continue thyroid supplement, SUP-protonix ?- INR goal 2.5 to 3. ?- keep K/mag > 4/2 respectively. ?- Consider cardiology consultation. ?- should get sleep testing and BiPAP Rx if has severe OSA/OHS. ?  ? ? ? ?Intervention Category ?Major Interventions: Respiratory failure - evaluation and management ?Evaluation Type: New Patient Evaluation ? ?KElmer Sow?01/03/2022, 5:39 AM ?

## 2022-01-03 NOTE — ED Notes (Signed)
Deborah Drought, MD has been notified regarding pt's vitals. ?

## 2022-01-03 NOTE — Progress Notes (Signed)
An USGPIV (ultrasound guided PIV) has been placed for short-term vasopressor infusion. A correctly placed ivWatch must be used when administering Vasopressors. Should this treatment be needed beyond 72 hours, central line access should be obtained.  It will be the responsibility of the bedside nurse to follow best practice to prevent extravasations.   ?

## 2022-01-03 NOTE — Progress Notes (Signed)
Hospitalist Floor coverage note: ?Pt seen and evaluated at bedside after RN reports that multiple BPs taken from pts R forearm showing in the 63S systolic. ? ?Per RN: 2L or more of UOP since lasix given earlier this shift for acute CHF. ? ?BP had been good up until around 2am after she got her evening meds including Percocet, gabapentin, and seroquel. ? ?Pt sleepy but does wake up and answer questions appropriately before going back to sleep. ? ?She states that BP is frequently inaccurate in R forearm.  Has L arm restriction in place due to h/o cancer many years ago. ? ?Also states that her fingers get cold so pulse ox on fingers is frequently inaccurate too. ? ?On evaluation: ? ? ?  01/03/2022  ?  4:30 AM 01/03/2022  ?  3:45 AM 01/03/2022  ?  3:38 AM  ?Vitals with BMI  ?Systolic 90 68   ?Diastolic 69 49   ?Pulse  75 87  ? RR 22  Satting 100% on RA when pulse ox picks up. ? ?4+ BLE pitting edema. ? ?Manual BP on R upper arm = systolic of 74 ? ?A/P: ?Ordered 25g of 25% albumin. ?Dont really want to give large volume IVF as pt clearly has CHF findings including severe peripheral edema, 15lbs wt gain in past week, etc. ?Discussed with Dr. Milon Dikes of PCCM: ?Concerned polypharmacy may be playing a role in her hypotension this evening. ?He recd starting levophed via peripheral and ICU admit ? ?After levophed started, SBP now up to 105. ? ?CRITICAL CARE ?Performed by: Etta Quill. ? ? ?Total critical care time: 70 minutes ? ?Critical care time was exclusive of separately billable procedures and treating other patients. ? ?Critical care was necessary to treat or prevent imminent or life-threatening deterioration. ? ?Critical care was time spent personally by me on the following activities: development of treatment plan with patient and/or surrogate as well as nursing, discussions with consultants, evaluation of patient's response to treatment, examination of patient, obtaining history from patient or surrogate, ordering  and performing treatments and interventions, ordering and review of laboratory studies, ordering and review of radiographic studies, pulse oximetry and re-evaluation of patient's condition. ?

## 2022-01-03 NOTE — Progress Notes (Addendum)
Purewick changed several times this shift due to leaking. Sheet and pads saturated. Skin intact. Had large gray-brown mushy stool in bedpan. Became dizzy while turning side to side. Recovered quickly. VSS. Measuring BP on lower leg with thigh cuff due to large leg circumference and presence of PICC in upper R arm.  ?

## 2022-01-03 NOTE — Progress Notes (Signed)
Pt refusing BIPAP tonight.

## 2022-01-03 NOTE — ED Notes (Signed)
Breakfast order placed ?

## 2022-01-03 NOTE — Progress Notes (Signed)
Peripherally Inserted Central Catheter Placement ? ?The IV Nurse has discussed with the patient and/or persons authorized to consent for the patient, the purpose of this procedure and the potential benefits and risks involved with this procedure.  The benefits include less needle sticks, lab draws from the catheter, and the patient may be discharged home with the catheter. Risks include, but not limited to, infection, bleeding, blood clot (thrombus formation), and puncture of an artery; nerve damage and irregular heartbeat and possibility to perform a PICC exchange if needed/ordered by physician.  Alternatives to this procedure were also discussed.  Bard Power PICC patient education guide, fact sheet on infection prevention and patient information card has been provided to patient /or left at bedside.   ? ?PICC Placement Documentation  ?PICC Double Lumen 01/03/22 Right Brachial 37 cm 0 cm (Active)  ?Indication for Insertion or Continuance of Line Vasoactive infusions;Prolonged intravenous therapies;Chronic illness with exacerbations (CF, Sickle Cell, etc.);Limited venous access - need for IV therapy >5 days (PICC only) 01/03/22 1405  ?Exposed Catheter (cm) 0 cm 01/03/22 1405  ?Site Assessment Clean, Dry, Intact 01/03/22 1405  ?Lumen #1 Status Flushed;Saline locked;Blood return noted 01/03/22 1405  ?Lumen #2 Status Flushed;Saline locked;Blood return noted 01/03/22 1405  ?Dressing Type Securing device;Transparent 01/03/22 1405  ?Dressing Status Antimicrobial disc in place;Clean, Dry, Intact 01/03/22 1405  ?Safety Lock Not Applicable 44/96/75 9163  ?Line Care Connections checked and tightened 01/03/22 1405  ?Line Adjustment (NICU/IV Team Only) No 01/03/22 1405  ?Dressing Intervention New dressing 01/03/22 1405  ?Dressing Change Due 01/10/22 01/03/22 1405  ? ? ? ? ? ?Rolena Infante ?01/03/2022, 2:06 PM ? ?

## 2022-01-03 NOTE — Progress Notes (Signed)
PT Cancellation Note ? ?Patient Details ?Name: Deborah Cooke ?MRN: [id number] ?DOB: 1951/05/16 ? ? ?Cancelled Treatment:    Reason Eval/Treat Not Completed: Patient at procedure or test/unavailable. Upon initiating PT eval, team arrived to place PICC line. Will plan to follow-up later as time permits. ? ? ?Moishe Spice, PT, DPT ?Acute Rehabilitation Services  ?Pager: [phone] ?Office: [phone] ? ? ? ?[name] ?01/03/2022, 1:35 PM ? ? ?

## 2022-01-03 NOTE — Progress Notes (Signed)
PT Cancellation Note ? ?Patient Details ?Name: IVET GUERRIERI ?MRN: [id number] ?DOB: 1951/01/01 ? ? ?Cancelled Treatment:    Reason Eval/Treat Not Completed: Patient at procedure or test/unavailable. Re-attempted following being cleared for PT s/p PICC line placement. However, pt now getting ECHO. Will plan to follow-up another day as able. ? ? ?Moishe Spice, PT, DPT ?Acute Rehabilitation Services  ?Pager: [phone] ?Office: [phone] ? ? ? ?[name] ?01/03/2022, 3:43 PM ? ? ?

## 2022-01-03 NOTE — Progress Notes (Signed)
ANTICOAGULATION CONSULT NOTE - Follow Up Consult ? ?Pharmacy Consult for Warfarin ?Indication: pulmonary embolus - recent 3/23 ? ?Allergies  ?Allergen Reactions  ? Nsaids Nausea And Vomiting and Other (See Comments)  ?  GI Upset (ibuprofen included)  ? Ibuprofen Other (See Comments)  ?  Other reaction(s): Abdominal Pain  ? Tolmetin Other (See Comments)  ?  Other reaction(s): Abdominal Pain ?  ? ? ?Patient Measurements: ?Weight: 119.1 kg (262 lb 9.1 oz) ? ? ?Vital Signs: ?Temp: 98.4 ?F (36.9 ?C) (04/16 1113) ?Temp Source: Oral (04/16 1113) ?BP: 119/89 (04/16 1113) ?Pulse Rate: 91 (04/16 1113) ? ?Labs: ?Recent Labs  ?  01/02/22 ?1813 01/02/22 ?2140 01/03/22 ?0430 01/03/22 ?1047  ?HGB 11.0*  --  9.1*  --   ?HCT 34.6*  --  28.8*  --   ?PLT 372  --  310  --   ?LABPROT 21.8*  --   --  21.3*  ?INR 1.9*  --   --  1.9*  ?CREATININE 1.20*  --  1.13*  --   ?TROPONINIHS 54* 48*  --   --   ? ? ? ?Estimated Creatinine Clearance: 51.5 mL/min (A) (by C-G formula based on SCr of 1.13 mg/dL (H)). ? ? ?Assessment: ?71 year old female admitted with dyspnea to continue warfarin for PE diagnosed in March.   ?INR on admission 1.9. INR remains 1.9 today. Will give slightly higher dose again tonight. Hgb down from 11 to 9.1, plts stable. No s/x of bleeding per RN. ? ?Dose PTA - > 4.5 mg Tuesday and Thursday, 3 mg MWFSS ? ?Goal of Therapy:  ?INR 2-3 ?Monitor platelets by anticoagulation protocol: Yes ?  ?Plan:  ?Warfarin 5 mg po x 1 dose now ?Daily INR and CBC ? ?Cathrine Muster, PharmD ?PGY2 Cardiology Pharmacy Resident ?Phone: [phone] ?01/03/2022  12:37 PM ? ?Please check [email or web address] for unit-specific pharmacy phone numbers. ? ? ?

## 2022-01-03 NOTE — Progress Notes (Signed)
Report given to this RN from [place], ED RN. Ready to receive patient.  ?

## 2022-01-04 ENCOUNTER — Encounter (HOSPITAL_COMMUNITY): Payer: Self-pay | Admitting: Internal Medicine

## 2022-01-04 ENCOUNTER — Ambulatory Visit: Payer: Self-pay | Admitting: Cardiothoracic Surgery

## 2022-01-04 DIAGNOSIS — I2510 Atherosclerotic heart disease of native coronary artery without angina pectoris: Secondary | ICD-10-CM | POA: Diagnosis not present

## 2022-01-04 DIAGNOSIS — N179 Acute kidney failure, unspecified: Secondary | ICD-10-CM | POA: Diagnosis not present

## 2022-01-04 DIAGNOSIS — J9621 Acute and chronic respiratory failure with hypoxia: Secondary | ICD-10-CM | POA: Diagnosis present

## 2022-01-04 DIAGNOSIS — J9622 Acute and chronic respiratory failure with hypercapnia: Secondary | ICD-10-CM

## 2022-01-04 DIAGNOSIS — I50813 Acute on chronic right heart failure: Secondary | ICD-10-CM | POA: Diagnosis not present

## 2022-01-04 DIAGNOSIS — E662 Morbid (severe) obesity with alveolar hypoventilation: Secondary | ICD-10-CM | POA: Diagnosis present

## 2022-01-04 DIAGNOSIS — I2609 Other pulmonary embolism with acute cor pulmonale: Secondary | ICD-10-CM | POA: Diagnosis not present

## 2022-01-04 LAB — BASIC METABOLIC PANEL
Anion gap: 7 (ref 5–15)
BUN: 24 mg/dL — ABNORMAL HIGH (ref 8–23)
CO2: 34 mmol/L — ABNORMAL HIGH (ref 22–32)
Calcium: 8.2 mg/dL — ABNORMAL LOW (ref 8.9–10.3)
Chloride: 97 mmol/L — ABNORMAL LOW (ref 98–111)
Creatinine, Ser: 0.93 mg/dL (ref 0.44–1.00)
GFR, Estimated: >60 mL/min (ref >60)
Glucose, Bld: 141 mg/dL — ABNORMAL HIGH (ref 70–99)
Potassium: 3.4 mmol/L — ABNORMAL LOW (ref 3.5–5.1)
Sodium: 138 mmol/L (ref 135–145)

## 2022-01-04 LAB — COOXEMETRY PANEL
Carboxyhemoglobin: 0.9 % (ref 0.5–1.5)
Carboxyhemoglobin: 0.5 % (ref 0.5–1.5)
Methemoglobin: <0.7 % (ref 0.0–1.5)
Methemoglobin: <0.7 % (ref 0.0–1.5)
O2 Saturation: 87 %
O2 Saturation: 60.5 %
Total hemoglobin: 10 g/dL — ABNORMAL LOW (ref 12.0–16.0)
Total hemoglobin: 10.9 g/dL — ABNORMAL LOW (ref 12.0–16.0)

## 2022-01-04 LAB — CBC
HCT: 30.2 % — ABNORMAL LOW (ref 36.0–46.0)
Hemoglobin: 9.9 g/dL — ABNORMAL LOW (ref 12.0–15.0)
MCH: 30.9 pg (ref 26.0–34.0)
MCHC: 32.8 g/dL (ref 30.0–36.0)
MCV: 94.4 fL (ref 80.0–100.0)
Platelets: 346 10*3/uL (ref 150–400)
RBC: 3.2 MIL/uL — ABNORMAL LOW (ref 3.87–5.11)
RDW: 14.7 % (ref 11.5–15.5)
WBC: 5.3 10*3/uL (ref 4.0–10.5)
nRBC: 0 % (ref 0.0–0.2)

## 2022-01-04 LAB — PROTIME-INR
INR: 2 — ABNORMAL HIGH (ref 0.8–1.2)
Prothrombin Time: 22.5 seconds — ABNORMAL HIGH (ref 11.4–15.2)

## 2022-01-04 LAB — MAGNESIUM: Magnesium: 1.8 mg/dL (ref 1.7–2.4)

## 2022-01-04 MED ORDER — POTASSIUM CHLORIDE CRYS ER 20 MEQ PO TBCR
40.0000 meq | EXTENDED_RELEASE_TABLET | Freq: Once | ORAL | Status: AC
  Administered 2022-01-04: 40 meq via ORAL
  Filled 2022-01-04: qty 2

## 2022-01-04 MED ORDER — MAGNESIUM SULFATE 2 GM/50ML IV SOLN
2.0000 g | Freq: Once | INTRAVENOUS | Status: AC
  Administered 2022-01-04: 2 g via INTRAVENOUS
  Filled 2022-01-04: qty 50

## 2022-01-04 MED ORDER — WARFARIN SODIUM 5 MG PO TABS
5.0000 mg | ORAL_TABLET | Freq: Once | ORAL | Status: AC
  Administered 2022-01-04: 5 mg via ORAL
  Filled 2022-01-04: qty 1

## 2022-01-04 MED ORDER — OXYCODONE-ACETAMINOPHEN 5-325 MG PO TABS
1.0000 | ORAL_TABLET | ORAL | Status: DC | PRN
  Administered 2022-01-04 – 2022-01-08 (×7): 1 via ORAL
  Filled 2022-01-04 (×7): qty 1

## 2022-01-04 NOTE — Plan of Care (Signed)

## 2022-01-04 NOTE — Progress Notes (Signed)
Orthopedic Tech Progress Note ?Patient Details:  ?Deborah Cooke ?[birth year] ?[id number] ? ?Ortho Devices ?Type of Ortho Device: Unna boot ?Ortho Device/Splint Location: BLE ?Ortho Device/Splint Interventions: Ordered, Application ?  ?Post Interventions ?Patient Tolerated: Well ?Instructions Provided: Care of device ? ?Janit Pagan ?01/04/2022, 10:17 AM ? ?

## 2022-01-04 NOTE — Assessment & Plan Note (Addendum)
Polypharmacy may have contributed to acute respiratory decompensation.  Patient is troubled mainly by suboccipital headache with no relief with adjunctive therapy. Brighter on less medication without appreciable change in cephalgia.  ? ?-Continue current pain regimen. ?

## 2022-01-04 NOTE — Progress Notes (Addendum)
? ? Advanced Heart Failure Rounding Note ? ?PCP-Cardiologist: Larae Grooms, MD  ? ?Subjective:   ? ?? Co-ox 87%. NE off this am. ? ?On lasix gtt at 15/hr. 2.3L UOP + 4 unmeasured voids charted yesterday. No weight this am. ? ?Scr 1.20>1.13>0.93 ? ?K 3.4 and Mag 1.8. ? ?Notes dizziness with positional changes. No dyspnea at rest but feels swollen especially in the abdomen. ? ? ? ?Objective:   ?Weight Range: ?119.1 kg ?Body mass index is 53.03 kg/m?.  ? ?Vital Signs:   ?Temp:  [98.4 ?F (36.9 ?C)-99.1 ?F (37.3 ?C)] 98.8 ?F (37.1 ?C) (04/17 0700) ?Pulse Rate:  [80-101] 87 (04/17 0700) ?Resp:  [14-26] 14 (04/17 0700) ?BP: (81-141)/(50-104) 128/91 (04/17 0700) ?SpO2:  [89 %-99 %] 99 % (04/17 0700) ?Last BM Date : 01/03/22 ? ?Weight change: ?Filed Weights  ? 01/03/22 0500  ?Weight: 119.1 kg  ? ? ?Intake/Output:  ? ?Intake/Output Summary (Last 24 hours) at 01/04/2022 0842 ?Last data filed at 01/04/2022 0800 ?Gross per 24 hour  ?Intake 1405.26 ml  ?Output 2950 ml  ?Net -1544.74 ml  ?  ? ? ?Physical Exam  ?  ?General:  No distress. Sitting up in bed. ?HEENT: Normal ?Neck: Supple. JVP to ear. Carotids 2+ bilat; no bruits.  ?Cor: PMI nondisplaced. Regular rate & rhythm. No rubs, gallops, 2/6 TR murmur ?Lungs: Diminished ?Abdomen: Obese, soft, nontender, + distended.  ?Extremities: No cyanosis, clubbing, rash, 2-3+ edema ?Neuro: Alert & orientedx3, cranial nerves grossly intact. moves all 4 extremities w/o difficulty. Affect pleasant ? ? ?Telemetry  ? ?SR 80s-90s, 2 Pvcs/min ? ? ?Labs  ?  ?CBC ?Recent Labs  ?  01/03/22 ?0430 01/04/22 ?0451  ?WBC 5.9 5.3  ?HGB 9.1* 9.9*  ?HCT 28.8* 30.2*  ?MCV 95.0 94.4  ?PLT 310 346  ? ?Basic Metabolic Panel ?Recent Labs  ?  01/03/22 ?0430 01/04/22 ?0451  ?NA 135 138  ?K 5.0 3.4*  ?CL 102 97*  ?CO2 27 34*  ?GLUCOSE 83 141*  ?BUN 32* 24*  ?CREATININE 1.13* 0.93  ?CALCIUM 8.0* 8.2*  ?MG 1.8 1.8  ? ?Liver Function Tests ?Recent Labs  ?  01/02/22 ?2140  ?AST 33  ?ALT 14  ?ALKPHOS 121  ?BILITOT  1.0  ?PROT 5.4*  ?ALBUMIN 2.8*  ? ?No results for input(s): LIPASE, AMYLASE in the last 72 hours. ?Cardiac Enzymes ?No results for input(s): CKTOTAL, CKMB, CKMBINDEX, TROPONINI in the last 72 hours. ? ?BNP: ?BNP (last 3 results) ?Recent Labs  ?  11/26/21 ?1119 01/02/22 ?1813  ?BNP 482.4* 405.8*  ? ? ?ProBNP (last 3 results) ?No results for input(s): PROBNP in the last 8760 hours. ? ? ?D-Dimer ?No results for input(s): DDIMER in the last 72 hours. ?Hemoglobin A1C ?No results for input(s): HGBA1C in the last 72 hours. ?Fasting Lipid Panel ?No results for input(s): CHOL, HDL, LDLCALC, TRIG, CHOLHDL, LDLDIRECT in the last 72 hours. ?Thyroid Function Tests ?No results for input(s): TSH, T4TOTAL, T3FREE, THYROIDAB in the last 72 hours. ? ?Invalid input(s): FREET3 ? ?Other results: ? ? ?Imaging  ? ? ?ECHOCARDIOGRAM COMPLETE ? ?Result Date: 01/03/2022 ?   ECHOCARDIOGRAM REPORT   Patient Name:   CELESTA FUNDERBURK Date of Exam: 01/03/2022 Medical Rec #:  [id number]    Height:       59.0 in Accession #:    [id number]   Weight:       262.6 lb Date of Birth:  02-09-1951    BSA:  2.071 m? Patient Age:    61 years     BP:           114/98 mmHg Patient Gender: F            HR:           90 bpm. Exam Location:  Inpatient Procedure: Cardiac Doppler and Color Doppler Indications:    Congestive Heart Failure I50.9  History:        Patient has prior history of Echocardiogram examinations, most                 recent 10/27/2021. CAD and Previous Myocardial Infarction; Prior                 CABG.  Sonographer:    Merrie Roof RDCS Referring Phys: [id number] [place]  1. Left ventricular ejection fraction, by estimation, is 65 to 70%. The left ventricle has normal function. The left ventricle has no regional wall motion abnormalities. There is mild concentric left ventricular hypertrophy. Diastolic function indeterminant due to severe MAC. Elevated left atrial pressure.  2. Right ventricular systolic function is normal. The right  ventricular size is normal.  3. Left atrial size was moderately dilated.  4. The mitral valve is abnormal. Mild mitral valve regurgitation. Mild calcific mitral stenosis with mean gradient 5.79mHg at HR 91bpm, MVA 2.0cm2 by continuity. Severe mitral annular calcification.  5. Tricuspid valve regurgitation is moderate to severe.  6. The aortic valve is tricuspid. There is mild calcification of the aortic valve. There is mild thickening of the aortic valve. Aortic valve regurgitation is mild. Mild aortic valve stenosis. Aortic valve mean gradient measures 10.0 mmHg. Aortic valve Vmax measures 2.13 m/s.  7. Pulmonic valve regurgitation is moderate.  8. Aortic dilatation noted. There is mild dilatation of the ascending aorta, measuring 38 mm.  9. Right atrial size was moderately dilated. Comparison(s): Compared to prior TTE on 10/27/21, there is no signficant change. FINDINGS  Left Ventricle: Left ventricular ejection fraction, by estimation, is 65 to 70%. The left ventricle has normal function. The left ventricle has no regional wall motion abnormalities. The left ventricular internal cavity size was normal in size. There is  mild concentric left ventricular hypertrophy. Diastolic function indeterminant due to severe MAC. Elevated left atrial pressure. Right Ventricle: The right ventricular size is normal. No increase in right ventricular wall thickness. Right ventricular systolic function is normal. Left Atrium: Left atrial size was moderately dilated. Right Atrium: Right atrial size was moderately dilated. Pericardium: There is no evidence of pericardial effusion. Mitral Valve: Mean gradient 5.661mg, MVA 2.0cm2 at HR 91bpm. The mitral valve is abnormal. There is severe thickening of the mitral valve leaflet(s). There is severe calcification of the mitral valve leaflet(s). Severe mitral annular calcification. Mild mitral valve regurgitation. Mild mitral valve stenosis. The mean mitral valve gradient is 5.6 mmHg.  Tricuspid Valve: The tricuspid valve is normal in structure. Tricuspid valve regurgitation is moderate to severe. Aortic Valve: The aortic valve is tricuspid. There is mild calcification of the aortic valve. There is mild thickening of the aortic valve. Aortic valve regurgitation is mild. Mild aortic stenosis is present. Aortic valve mean gradient measures 10.0 mmHg. Aortic valve peak gradient measures 18.1 mmHg. Aortic valve area, by VTI measures 2.05 cm?. Pulmonic Valve: The pulmonic valve was normal in structure. Pulmonic valve regurgitation is moderate. Aorta: Aortic dilatation noted. There is mild dilatation of the ascending aorta, measuring 38 mm. IAS/Shunts: The atrial septum  is grossly normal.  LEFT VENTRICLE PLAX 2D LVIDd:         4.50 cm   Diastology LVIDs:         3.30 cm   LV e' medial:    6.71 cm/s LV PW:         1.10 cm   LV E/e' medial:  22.8 LV IVS:        1.10 cm   LV e' lateral:   5.47 cm/s LVOT diam:     2.10 cm   LV E/e' lateral: 28.0 LV SV:         76 LV SV Index:   36 LVOT Area:     3.46 cm?  RIGHT VENTRICLE RV Basal diam:  2.80 cm RV S prime:     7.71 cm/s TAPSE (M-mode): 0.9 cm LEFT ATRIUM             Index        RIGHT ATRIUM           Index LA diam:        3.30 cm 1.59 cm/m?   RA Area:     18.60 cm? LA Vol (A2C):   66.9 ml 32.31 ml/m?  RA Volume:   45.20 ml  21.83 ml/m? LA Vol (A4C):   86.7 ml 41.87 ml/m? LA Biplane Vol: 82.2 ml 39.70 ml/m?  AORTIC VALVE AV Area (Vmax):    1.97 cm? AV Area (Vmean):   1.96 cm? AV Area (VTI):     2.05 cm? AV Vmax:           212.67 cm/s AV Vmean:          146.333 cm/s AV VTI:            0.369 m AV Peak Grad:      18.1 mmHg AV Mean Grad:      10.0 mmHg LVOT Vmax:         121.00 cm/s LVOT Vmean:        83.000 cm/s LVOT VTI:          0.218 m LVOT/AV VTI ratio: 0.59  AORTA Ao Root diam: 3.30 cm Ao Asc diam:  3.80 cm MITRAL VALVE                TRICUSPID VALVE MV Area (PHT): 4.06 cm?     TR Peak grad:   25.8 mmHg MV Mean grad:  5.6 mmHg     TR Vmax:        254.00  cm/s MV Decel Time: 187 msec MV E velocity: 153.00 cm/s  SHUNTS MV A velocity: 141.00 cm/s  Systemic VTI:  0.22 m MV E/A ratio:  1.09         Systemic Diam: 2.10 cm Gwyndolyn Kaufman MD Electronically signed

## 2022-01-04 NOTE — Progress Notes (Signed)
? ?NAME:  Deborah Cooke, MRN:  [id number], DOB:  12-Nov-1950, LOS: 2 ?ADMISSION DATE:  01/02/2022, CONSULTATION DATE: January 03, 2022 ?REFERRING MD: Hospitalist service, CHIEF COMPLAINT: Low blood pressure ? ?History of Present Illness:  ?Patient 71 year old female with past medical history of coronary artery disease CABG PE on Coumadin hypothyroidism who presented with severe volume anasarca volume overload dyspnea on exertion she was given a dose of Lasix she was diuresing very well in the ED later on in the course patient received all of her medications including gabapentin 300 mg of Seroquel oxycodone later on in the course she became hypotensive she was given some fluids back with no improvement she was put on pressors for systolic blood pressure of 60.  Patient had no fever chills rigors no nausea vomiting diarrhea.  Shortness of breath x10 days.  Had a STEMI with cardiogenic shock requiring IABP, severe 3-vessel disease > CABG x4 in February 2023. She had IABP removed POD4, inotropes weaned subsequently. She was diagnosed with PE in early March and acute heart failure in March as well.  Had issues with low BPs and difficulty tolerating diuresis-- only tolerating metoprolol succ 12.'5mg'$  daily. 15 pound weight gain in the last week. Did poorly after discharge from SNF rehab post-hospitalization.  Her son reports that she has recent memory impairment for the past few months and he is concern for dementia. ? ?On 4/12 she had a fall at home due to tripping when walking with her walker.  She fell and hit her head on concrete.  She is chronically on Coumadin.  In the ED that day and discharged home.  Mobility has worsened since her fall. ? ?119 kg at admission, was 106kg on 3/28. ? ?PMH:  ?Breast cancer in 2018 ?Chronic cor pulmonale ?PE March 2023- on coumadin ?GERD ?Hypertension ?Hypothyroidism ?Osteoporosis ?Chronic opiate use ? ?Hospital course:  ?4/15 admitted, upgraded to ICU overnight ?4/17 Diuresing well, awake  and on [place], off [place],  ? ? ?Interim history:  ?Sitting up eating breakfast and asking for coffee, breathing feels better  ?2.3L UOP yesterday ? ? ?Objective   ?Blood pressure (!) 128/91, pulse 87, temperature 98.8 ?F (37.1 ?C), temperature source Oral, resp. rate 14, weight 119.1 kg, SpO2 99 %. ?   ?   ? ?Intake/Output Summary (Last 24 hours) at 01/04/2022 0846 ?Last data filed at 01/04/2022 0800 ?Gross per 24 hour  ?Intake 1405.26 ml  ?Output 2950 ml  ?Net -1544.74 ml  ? ? ?Filed Weights  ? 01/03/22 0500  ?Weight: 119.1 kg  ? ? ?General:  chronically ill-appearing F sitting up in bed eating breakfast NAD ?HEENT: MM pink/moist, sclera anicteric, pupils equal ?Neuro: awake, alert, oriented and moving all extremities ?CV: s1s2 rrr, no m/r/g ?PULM:  no rhonchi or wheezing, mildly diminished in the bases, on [place] in no distress ?GI: soft, bsx4 active  ?Extremities: warm/dry, bilateral pitting R>L edema  ?Skin: no rashes or lesions ? ?Labs reviewed  ?Coox 87% ?Mag 1.8 ?K 3.4 ?Creatinine 0.93 ? ?Assessment & Plan:  ? ? ? ?Hypotension, ?Thought to be secondary to chronic right heart failure, possibly also left heart failure with polypharmacy contributing. ?-Blood pressure improving and off pressors today ?-appreciate HF team following, continue Lasix gtt today and measure CVP ?-Co-ox 87% ?- Aggressive treatment of right heart failure.  Diuresis with Lasix infusion. ?-EF 65-70% on echo, mod to severe TR ?- Hold metoprolol ?- continue midodrine 5 mg 3 times daily ?-Unna boots ?-Planning for right heart catheterization  when euvolemic.  Appreciate cardiology's management. ?- Nocturnal BiPAP; needs outpatient sleep study and NIPPV ?-PICC line, Cooximetry ?- Needs to prioritize long-term weight loss as a goal.  ? ?Acute hypoxic and hypercapnic respiratory failure, likely chronic hypercapnia due to OHS, OSA, possibly also acute pulmonary edema. ?Recent PE ?- continue supplemental oxygen to maintain SPO2 greater than 88%. ?-  Anticipate she needs nocturnal noninvasive positive pressure ventilation, needs outpatient sleep study. ?- Long-term weight loss. ?- Diuresis as above  ?-Continue Coumadin, dosing per pharmacy.  Cannot tolerate DOAC with carbamazepine. ? ?CAD,  ?s/p CABG in Feb 2023 ?- Needs Plavix indefinitely due to cryo graft ?- continue Aspirin, statin ?- continue Holding metoprolol due to hypotension; has generally been intolerant to beta-blockers from hypotension at home. ? ?Acute encephalopathy; some concern for dementia with recent memory loss over the last few months at home ?Improving today ?- Ammonia level ok ?- Avoid sedating medications as able, requesting pain medications ? ?AKI,  ?likely cardiorenal ?Creatinine improving, 2.3L UOP yesterday ?-Strict I's/O ?- Renally dose meds and avoid nephrotoxic meds ?- Diuresis ?- Maintain adequate renal perfusion. ? ?Anemia due to chronic disease ?-Transfuse for hemoglobin less than 7 or hemodynamically significant bleeding ?- stable, continue  to monitor ? ?Deconditioning ?- Needs aggressive PT, OT. ?- Had a discussion at bedside with patient and her son that she is not safe to live at home currently. ? ? ? ? ?Labs   ?CBC: ?Recent Labs  ?Lab 12/30/21 ?1640 01/02/22 ?1813 01/03/22 ?0430 01/04/22 ?0451  ?WBC 6.6 6.6 5.9 5.3  ?NEUTROABS 4.3  --   --   --   ?HGB 11.3* 11.0* 9.1* 9.9*  ?HCT 36.0 34.6* 28.8* 30.2*  ?MCV 97.0 96.1 95.0 94.4  ?PLT 310 372 310 346  ? ? ? ?Basic Metabolic Panel: ?Recent Labs  ?Lab 12/30/21 ?1640 01/02/22 ?1813 01/03/22 ?0430 01/04/22 ?0451  ?NA 137 135 135 138  ?K 4.9 4.8 5.0 3.4*  ?CL 103 101 102 97*  ?CO2 '24 24 27 '$ 34*  ?GLUCOSE 94 110* 83 141*  ?BUN 22 33* 32* 24*  ?CREATININE 0.84 1.20* 1.13* 0.93  ?CALCIUM 8.7* 8.8* 8.0* 8.2*  ?MG  --   --  1.8 1.8  ? ? ?GFR: ?Estimated Creatinine Clearance: 62.6 mL/min (by C-G formula based on SCr of 0.93 mg/dL). ?Recent Labs  ?Lab 12/30/21 ?1640 01/02/22 ?1813 01/03/22 ?0430 01/03/22 ?0745 01/04/22 ?0451  ?WBC 6.6  6.6 5.9  --  5.3  ?LATICACIDVEN  --   --  1.3 1.9  --   ? ? ? ?Liver Function Tests: ?Recent Labs  ?Lab 01/02/22 ?2140  ?AST 33  ?ALT 14  ?ALKPHOS 121  ?BILITOT 1.0  ?PROT 5.4*  ?ALBUMIN 2.8*  ? ? ?No results for input(s): LIPASE, AMYLASE in the last 168 hours. ?Recent Labs  ?Lab 01/03/22 ?1104  ?AMMONIA 29  ? ? ?ABG ?   ?Component Value Date/Time  ? PHART 7.44 11/26/2021 2342  ? PCO2ART 47 11/26/2021 2342  ? PO2ART 91 11/26/2021 2342  ? HCO3 32.0 (H) 11/26/2021 2342  ? TCO2 32 11/02/2021 0347  ? ACIDBASEDEF 1.0 10/28/2021 0036  ? O2SAT 87 01/04/2022 0451  ? ?  ? ?Coagulation Profile: ?Recent Labs  ?Lab 12/30/21 ?0000 12/30/21 ?1640 01/02/22 ?1813 01/03/22 ?1047 01/04/22 ?0451  ?INR 2.6 2.3* 1.9* 1.9* 2.0*  ? ? ?Best Practice (right click and "Reselect all SmartList Selections" daily)  ? ?Diet/type: Regular consistency (see orders) ?DVT prophylaxis: other, coumadin ?GI prophylaxis: N/A ?Lines: yes and  it is still needed ?Foley:  Yes, and it is still needed ?Code Status:  full code ?Last date of multidisciplinary goals of care discussion [pending ] ? ?CRITICAL CARE ?Performed by: Otilio Carpen Rubel Heckard ? ? ?Total critical care time: 32 minutes ? ?Critical care time was exclusive of separately billable procedures and treating other patients. ? ?Critical care was necessary to treat or prevent imminent or life-threatening deterioration. ? ?Critical care was time spent personally by me on the following activities: development of treatment plan with patient and/or surrogate as well as nursing, discussions with consultants, evaluation of patient's response to treatment, examination of patient, obtaining history from patient or surrogate, ordering and performing treatments and interventions, ordering and review of laboratory studies, ordering and review of radiographic studies, pulse oximetry and re-evaluation of patient's condition. ? ? ? ?Otilio Carpen Jermaine Tholl, PA-C ? Pulmonary & Critical care ?See Amion for pager ?If no  response to pager , please call [phone] until 7pm ?After 7:00 pm call Elink  [phone] ? ? ?  ?

## 2022-01-04 NOTE — Assessment & Plan Note (Signed)
-  Status post bypass February 2023 ? ?-Continue ASA Plavix and statin. ?-Appears to be beta-blocker intolerant due to hypotension likely related to right heart failure. ? ?

## 2022-01-04 NOTE — Plan of Care (Signed)
?  Problem: Education: ?Goal: Ability to demonstrate management of disease process will improve ?Outcome: Progressing ?Goal: Ability to verbalize understanding of medication therapies will improve ?Outcome: Progressing ?Goal: Individualized Educational Video(s) ?Outcome: Progressing ?  ?Problem: Activity: ?Goal: Capacity to carry out activities will improve ?Outcome: Progressing ?  ?Problem: Cardiac: ?Goal: Ability to achieve and maintain adequate cardiopulmonary perfusion will improve ?Outcome: Progressing ?  ?Problem: Education: ?Goal: Knowledge of General Education information will improve ?Description: Including pain rating scale, medication(s)/side effects and non-pharmacologic comfort measures ?Outcome: Progressing ?  ?Problem: Health Behavior/Discharge Planning: ?Goal: Ability to manage health-related needs will improve ?Outcome: Progressing ?  ?Problem: Clinical Measurements: ?Goal: Ability to maintain clinical measurements within normal limits will improve ?Outcome: Progressing ?Goal: Will remain free from infection ?Outcome: Progressing ?Goal: Diagnostic test results will improve ?Outcome: Progressing ?Goal: Respiratory complications will improve ?Outcome: Progressing ?Goal: Cardiovascular complication will be avoided ?Outcome: Progressing ?  ?

## 2022-01-04 NOTE — Progress Notes (Signed)
ANTICOAGULATION CONSULT NOTE - Follow Up Consult ? ?Pharmacy Consult for Warfarin ?Indication: pulmonary embolus - recent 3/23 ? ?Allergies  ?Allergen Reactions  ? Nsaids Nausea And Vomiting and Other (See Comments)  ?  GI Upset (ibuprofen included)  ? Ibuprofen Other (See Comments)  ?  Other reaction(s): Abdominal Pain  ? Tolmetin Other (See Comments)  ?  Other reaction(s): Abdominal Pain ?  ? ? ?Patient Measurements: ?Weight: 119.1 kg (262 lb 9.1 oz) ? ? ?Vital Signs: ?Temp: 98.8 ?F (37.1 ?C) (04/17 0700) ?Temp Source: Oral (04/17 0700) ?BP: 128/91 (04/17 0700) ?Pulse Rate: 98 (04/17 0800) ? ?Labs: ?Recent Labs  ?  01/02/22 ?1813 01/02/22 ?2140 01/03/22 ?0430 01/03/22 ?1047 01/04/22 ?0451  ?HGB 11.0*  --  9.1*  --  9.9*  ?HCT 34.6*  --  28.8*  --  30.2*  ?PLT 372  --  310  --  346  ?LABPROT 21.8*  --   --  21.3* 22.5*  ?INR 1.9*  --   --  1.9* 2.0*  ?CREATININE 1.20*  --  1.13*  --  0.93  ?TROPONINIHS 54* 48*  --   --   --   ? ? ? ?Estimated Creatinine Clearance: 62.6 mL/min (by C-G formula based on SCr of 0.93 mg/dL). ? ? ?Assessment: ?71 year old female admitted with dyspnea to continue warfarin for PE diagnosed in March.   ?INR on admission 1.9. INR remains 2.0 today. Will give slightly higher dose again tonight. Hgb 9.9, plts stable. No s/x of bleeding per RN. ? ?Dose PTA - > 4.5 mg Tuesday and Thursday, 3 mg MWFSS ? ?Goal of Therapy:  ?INR 2-3 ?Monitor platelets by anticoagulation protocol: Yes ?  ?Plan:  ?Warfarin 5 mg po x 1 dose today ?Daily INR and CBC ? ?Erin Hearing PharmD., BCPS ?Clinical Pharmacist ?01/04/2022 8:56 AM ? ?

## 2022-01-04 NOTE — Progress Notes (Signed)
RT attempted to place pt on bipap for the night. Pt unable to tolerate, asking RT to " please take it off." Made RN aware. ?

## 2022-01-04 NOTE — TOC Initial Note (Signed)
Transition of Care (TOC) - Initial/Assessment Note  ? ? ?Patient Details  ?Name: Deborah Cooke ?MRN: [id number] ?Date of Birth: 02-26-1951 ? ?Transition of Care [facility]) CM/SW Contact:    ?Marcheta Grammes Rexene Alberts, RN ?Phone Number: [phone] ?01/04/2022, 12:33 PM ? ?Clinical Narrative:                 ?HF TOC CM spoke to pt and son, Remo Lipps at bedside. Pt son lives 3 hours away and husband works full-time. Pt recently went to SNF rehab at [facility]. Son wants SNF rehab. Gave permission to create FL2 and fax referral to SNF rehab. Pt was at [facility] in [place] recently also. Son states pt has used her rehab days and was paying copay. Will follow up with [facility] for cost for rehab. Son states pt may have VA benefits from her husband being in [employer] and with Public house manager. ? ?Expected Discharge Plan: [place] ?Barriers to Discharge: Continued Medical Work up ? ? ?Patient Goals and CMS Choice ?Patient states their goals for this hospitalization and ongoing recovery are:: wants her to be safe at home ?CMS Medicare.gov Compare Post Acute Care list provided to:: Patient Represenative (must comment) (son-Steven Para March) ?Choice offered to / list presented to : Adult Children ? ?Expected Discharge Plan and Services ?Expected Discharge Plan: [place] ?  ?  ?Post Acute Care Choice: [place] ?Living arrangements for the past 2 months: [place] ?                ?  ?  ?  ?  ?  ?  ?  ?  ?  ?  ? ?Prior Living Arrangements/Services ?Living arrangements for the past 2 months: [place] ?Lives with:: Spouse ?Patient language and need for interpreter reviewed:: Yes ?Do you feel safe going back to the place where you live?: Yes      ?Need for Family Participation in Patient Care: Yes (Comment) ?Care giver support system in place?: Yes (comment) ?Current home services: DME (rolling walker, rollator, lift chair) ?Criminal Activity/Legal Involvement Pertinent to  Current Situation/Hospitalization: No - Comment as needed ? ?Activities of Daily Living ?  ?  ? ?Permission Sought/Granted ?Permission sought to share information with : Case Manager, Family Supports, PCP ?Permission granted to share information with : Yes, Verbal Permission Granted ? Share Information with NAME: Vincenza Hews ? Permission granted to share info w AGENCY: SNF rehab, Home Health ? Permission granted to share info w Relationship: son, husband ? Permission granted to share info w Contact Information: [phone] ? ?Emotional Assessment ?Appearance:: Appears stated age ?Attitude/Demeanor/Rapport: Engaged ?Affect (typically observed): Accepting ?Orientation: : Oriented to Self, Oriented to Place, Oriented to  Time, Oriented to Situation ?  ?Psych Involvement: No (comment) ? ?Admission diagnosis:  Hypoxia [R09.02] ?Acute on chronic congestive heart failure, unspecified heart failure type ([place]) [I50.9] ?Acute on chronic heart failure with preserved ejection fraction (HFpEF) ([place]) [I50.33] ?Patient Active Problem List  ? Diagnosis Date Noted  ? Arterial hypotension   ? Acute cor pulmonale (HCC)   ? Acute on chronic congestive heart failure ([place]) 01/02/2022  ? Mood disorder ([place]) 01/02/2022  ? Atrial fibrillation ([place]) 12/15/2021  ? Hyperlipidemia LDL goal <70 12/14/2021  ? Chronic cor pulmonale ([place]) 12/14/2021  ? Coronary artery disease involving native coronary artery of native heart without angina pectoris 12/07/2021  ? Myoclonic jerking 11/27/2021  ? AKI (acute kidney injury) ([place]) 11/27/2021  ?  Generalized weakness 11/27/2021  ? History of pulmonary embolism 11/26/2021  ? 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 Old MI (myocardial infarction)   ? S/P reverse total shoulder arthroplasty, right 10/20/2021  ? Rotator cuff arthropathy of right shoulder 09/22/2021  ? Type I or II open comminuted intra-articular fracture of distal end of left  femur, initial encounter ([place]) 10/15/2020  ? Closed fracture of left distal femur ([place]) 10/14/2020  ? Encounter for therapeutic drug monitoring 04/21/2017  ? Family history of breast cancer   ? Malignant neoplasm of upper-outer quadrant of left breast in female, estrogen receptor positive ([place]) 03/11/2017  ? Essential hypertension   ? Hypothyroidism   ? Osteoporosis   ? Anxiety   ? Acute blood loss anemia 09/19/2012  ? Chronic pain 09/19/2012  ? Osteoporosis with fracture 09/19/2012  ? Closed supracondylar fracture of right femur ([place]) 09/18/2012  ? ?PCP:  Rich Fuchs, PA ?Pharmacy:   ?[id number] - [place] - [address] ?[address] ?[place] [address] ?Phone: [phone] Fax: [phone] ? ? ? ? ?Social Determinants of Health (SDOH) Interventions ?  ? ?Readmission Risk Interventions ?   ? View : No data to display.  ?  ?  ?  ? ? ? ?

## 2022-01-04 NOTE — TOC Progression Note (Signed)
Transition of Care (TOC) - Progression Note  ? ? ?Patient Details  ?Name: Deborah Cooke ?MRN: [id number] ?Date of Birth: 12-20-1950 ? ?Transition of Care (TOC) CM/SW Contact  ?Lupe Bonner, LCSW ?Phone Number: ?01/04/2022, 2:06 PM ? ?Clinical Narrative:    ?CSW received consult for possible SNF placement at time of discharge. CSW spoke with the patient and her son, Remo Lipps at bedside. Patient reported that patient's spouse is currently unable to care for patient at their home given patient?s current physical needs and fall risk and that he is still working and only there 3X a month. Patient expressed understanding of PT recommendation and is agreeable to SNF placement at time of discharge. Patient reports preference for [facility]. CSW discussed insurance authorization process and provided Medicare SNF ratings list. Patient has received the COVID vaccines X3. CSW will send out referrals for review. Patient expressed being hopeful for rehab and to feel better soon. No further questions reported at this time.  ? ?Skilled Nursing Rehab Facilities-   [email or web address]   Ratings out of 5 possible   ?Name Address  Phone # Quality Care Staffing Health Inspection Overall  ?[facility] [address] [place] '5 5 2 4  '$ ?[place]  [address], [place] [phone] '4 2 5 5  '$ ?[facility] [place], [place] '4 1 1 1  '$ ?[facility] [address] [place] '2 2 4 4  '$ ?[facility] [address], [place] '1 1 2 1  '$ ?[place] '2 1 4 3  '$ ?[facility] [address] [place] '5 2 2 3  '$ ?[place], [place] '5 2 2 3  '$ ?[facility] ([facility]) [place] [phone] '5 1 2 2  '$ ?[facility] [address], [place] [phone] '4 1 1 1  '$ ?[facility] [address], [place] [phone] '4 1 2 1  '$ ?[facility] ([place]) [place] [phone] '4 1 1 1  '$ ?        ?[place], [place]      ?[facility] '4 2 3 3  '$ ?[facility] [place], [place] '3 1 5 4  '$ ?[place] [address], [place] [phone] '2 1 1 1  '$ ?[facility] [address], [place] [phone] '2 2 3 3  '$ ?        ?[address] (no [facility]) [address], [place] [phone] '4 5 5 5  '$ ?[facility] (No Humana) [address], [place] '4 1 4 3  '$ ?[facility] (No [employer]) [address] [place] [phone] '5 5 5 5  '$ ?[facility] [address], [place] [phone] '3 2 4 4  '$ ?[facility] [address] [place] '3 3 4 4  '$ ?[place] [address], [place] '1 1 2 1  '$ ?[facility] [address] [place] [phone] '2 1 1 1  '$ ?[facility] '5 2 4 5  '$ ?[facility] [address] [place] '3 1 1 1  '$ ?[facility] [place], [place] '2 1 2 1  '$ ?        ?[facility] [address] [place] '1 1 1 1  '$ ?[facility] [address] [place]  [phone] '2 4 2 2  '$ ?[facility] [address], [place] [phone] '5 2 3 4  '$ ?[place] [address] [place] '2 1 1 1  '$ ?[place] (No Humana) [address] [place] '2 1 3 2  '$ ?[facility] [address], [place] [phone] '3 1 1 1  '$ ?        ?[facility] [place], [place] '5 4 5 5  '$ ?[facility])  [address], [place] '2 2 3 3  '$ ?[facility] [place], [place] '3 2 4 4  '$ ?[facility] [address]  [place] '4 3 4 4  '$ ?[address] [place], [place] '3 3 1 1  '$ ?[facility]) [address] [place] [phone] '2 2 4 4  '$ ?  ? ? ?Expected Discharge Plan: [place] ?Barriers to Discharge: Continued Medical Work up ? ?Expected Discharge Plan and Services ?Expected Discharge Plan: [place] ?  ?  ?Post Acute Care Choice: [place] ?Living arrangements for the past 2 months: [place] ?                ?  ?  ?  ?  ?  ?  ?  ?  ?  ?  ? ? ?Social Determinants of Health (SDOH) Interventions ?Housing Interventions: Intervention Not Indicated ?Transportation Interventions: Intervention Not Indicated ? ?Readmission Risk Interventions ?   ? View : No data to display.  ?  ?  ?  ? ? ?[place], MSW, LCSW ?[phone] ?Heart Failure Social Worker  ? ?

## 2022-01-04 NOTE — Evaluation (Signed)
Physical Therapy Evaluation ?Patient Details ?Name: CASH MEADOW ?MRN: [id number] ?DOB: [birth year] ?Today's Date: 01/04/2022 ? ?History of Present Illness ? Pt is a 71 y.o. F who presents with severe volume anasarca volume overload, dyspnea on exertion, hypotension. 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  ?Clinical Impression ? PTA, pt with recent discharge from SNF, using RW to ambulate, and recent fall. Pt presents with decreased functional mobility secondary to generalized weakness, obesity, impaired standing balance, and decreased activity tolerance. Pt negative for orthostatic hypotension upon assessment, but does report lightheadedness with transition to sitting. Encouraged gaze stabilization. Pt ambulating ~5 ft with a walker and chair follow. SpO2 90% on 4L O2. Will continue to progress as tolerated. ? ?Vitals: ?Supine: 111/74 (86) ?Sitting: 111/72 (84) ?Sitting x 3 minutes: 104/83 (91) ?Sitting post mobility: 119/94 (104) ?   ? ?Recommendations for follow up therapy are one component of a multi-disciplinary discharge planning process, led by the attending physician.  Recommendations may be updated based on patient status, additional functional criteria and insurance authorization. ? ?Follow Up Recommendations Skilled nursing-short term rehab (<3 hours/day) ? ?  ?Assistance Recommended at Discharge Frequent or constant Supervision/Assistance  ?Patient can return home with the following ? A little help with walking and/or transfers;A little help with bathing/dressing/bathroom;Assistance with cooking/housework;Assist for transportation;Help with stairs or ramp for entrance ? ?  ?Equipment Recommendations None recommended by PT  ?Recommendations for Other Services ?    ?  ?Functional Status Assessment Patient has had a recent decline in their functional status and demonstrates the ability to make significant improvements in  function in a reasonable and predictable amount of time.  ? ?  ?Precautions / Restrictions Precautions ?Precautions: Fall ?Restrictions ?Weight Bearing Restrictions: No  ? ?  ? ?Mobility ? Bed Mobility ?Overal bed mobility: Needs Assistance ?Bed Mobility: Supine to Sit ?  ?  ?Supine to sit: Mod assist, +2 for physical assistance ?  ?  ?General bed mobility comments: Assist for BLE's off edge of bed, use of bed pad to scoot hips forward to edge ?  ? ?Transfers ?Overall transfer level: Needs assistance ?Equipment used: Rolling walker (2 wheels) ?Transfers: Sit to/from Stand ?Sit to Stand: Min assist, +2 safety/equipment ?  ?  ?  ?  ?  ?General transfer comment: Light minA to rise from edge of bed ?  ? ?Ambulation/Gait ?Ambulation/Gait assistance: Min guard, +2 safety/equipment ?Gait Distance (Feet): 5 Feet ?Assistive device: Rolling walker (2 wheels) ?Gait Pattern/deviations: Step-through pattern, Decreased stride length, Narrow base of support ?Gait velocity: decreased ?Gait velocity interpretation: <1.8 ft/sec, indicate of risk for recurrent falls ?  ?General Gait Details: Shuffling gait, decreased bilateral foot clearance, min guard for safety. chair follow ? ?Stairs ?  ?  ?  ?  ?  ? ?Wheelchair Mobility ?  ? ?Modified Rankin (Stroke Patients Only) ?  ? ?  ? ?Balance Overall balance assessment: Needs assistance ?Sitting-balance support: Feet supported ?Sitting balance-Leahy Scale: Fair ?  ?  ?Standing balance support: Bilateral upper extremity supported ?Standing balance-Leahy Scale: Poor ?Standing balance comment: reliant on RW ?  ?  ?  ?  ?  ?  ?  ?  ?  ?  ?  ?   ? ? ? ?Pertinent Vitals/Pain Pain Assessment ?Pain Assessment: No/denies pain  ? ? ?Home Living Family/patient expects to be discharged to:: Private residence ?Living Arrangements: Spouse/significant other ?Available Help at Discharge: Family;Available PRN/intermittently ?  Type of Home: House ?Home Access: Level entry ?  ?  ?  ?Home Layout: One  level ?Home Equipment: Cane - single Barista (2 wheels) ?   ?  ?Prior Function Prior Level of Function : Needs assist ?  ?  ?  ?  ?  ?  ?Mobility Comments: using RW ?ADLs Comments: increased assist since d/c from rehab ?  ? ? ?Hand Dominance  ? Dominant Hand: Right ? ?  ?Extremity/Trunk Assessment  ? Upper Extremity Assessment ?Upper Extremity Assessment: Defer to OT evaluation ?  ? ?Lower Extremity Assessment ?Lower Extremity Assessment: Generalized weakness ?  ? ?Cervical / Trunk Assessment ?Cervical / Trunk Assessment: Other exceptions ?Cervical / Trunk Exceptions: increased body habitus  ?Communication  ? Communication: No difficulties  ?Cognition Arousal/Alertness: Awake/alert ?Behavior During Therapy: [facility] for tasks assessed/performed ?Overall Cognitive Status: Impaired/Different from baseline ?Area of Impairment: Memory ?  ?  ?  ?  ?  ?  ?  ?  ?  ?  ?Memory: Decreased short-term memory ?  ?  ?  ?  ?General Comments: Cannot recall fall on 4/12 ?  ?  ? ?  ?General Comments   ? ?  ?Exercises    ? ?Assessment/Plan  ?  ?PT Assessment Patient needs continued PT services  ?PT Problem List Decreased strength;Decreased activity tolerance;Decreased balance;Decreased mobility ? ?   ?  ?PT Treatment Interventions DME instruction;Gait training;Functional mobility training;Therapeutic activities;Therapeutic exercise;Balance training;Patient/family education   ? ?PT Goals (Current goals can be found in the Care Plan section)  ?Acute Rehab PT Goals ?Patient Stated Goal: go back to rehab ?PT Goal Formulation: With patient ?Time For Goal Achievement: 01/18/22 ?Potential to Achieve Goals: Good ? ?  ?Frequency Min 3X/week ?  ? ? ?Co-evaluation   ?  ?  ?  ?  ? ? ?  ?AM-PAC PT "6 Clicks" Mobility  ?Outcome Measure Help needed turning from your back to your side while in a flat bed without using bedrails?: A Little ?Help needed moving from lying on your back to sitting on the side of a flat bed without using bedrails?:  A Lot ?Help needed moving to and from a bed to a chair (including a wheelchair)?: A Little ?Help needed standing up from a chair using your arms (e.g., wheelchair or bedside chair)?: A Little ?Help needed to walk in hospital room?: Total ?Help needed climbing 3-5 steps with a railing? : Total ?6 Click Score: 13 ? ?  ?End of Session Equipment Utilized During Treatment: Gait belt;Oxygen ?Activity Tolerance: Patient tolerated treatment well ?Patient left: in chair;with call bell/phone within reach;with chair alarm set ?Nurse Communication: Mobility status ?PT Visit Diagnosis: Unsteadiness on feet (R26.81);Muscle weakness (generalized) (M62.81);Difficulty in walking, not elsewhere classified (R26.2) ?  ? ?Time: 6073-7106 ?PT Time Calculation (min) (ACUTE ONLY): 31 min ? ? ?Charges:   PT Evaluation ?$PT Eval Moderate Complexity: 1 Mod ?  ?  ?   ? ? ?Wyona Almas, PT, DPT ?Acute Rehabilitation Services ?Pager [phone] ?Office [phone] ? ? ?Carloine Margo Aye ?01/04/2022, 10:56 AM ? ?

## 2022-01-04 NOTE — Assessment & Plan Note (Addendum)
Last ABG in March normal, but elevated total CO2 suggests chronicity. Patient was not comfortable on BiPAP last night,.  ? ?-Hold on further nocturnal BiPAP as sleep disordered breathing might improve with diuresis and decrease in sedative medication.  ?-Will need outpatient sleep study. ?

## 2022-01-04 NOTE — Assessment & Plan Note (Signed)
Creatinine has now normalized with renal decongestion ? ?-Continue IV diuresis with infusion. ?-Possible transition to intermittent furosemide tomorrow ?

## 2022-01-04 NOTE — Assessment & Plan Note (Addendum)
Patient has morbid obesity and likely untreated sleep apnea despite prior gastric bypass surgery.  ? ?-Consider referral to comprehensive weight loss program and starting GLP-1 agonist for weight loss. ?

## 2022-01-04 NOTE — Assessment & Plan Note (Addendum)
Due to combination of obesity hypoventilation and recent pulmonary embolism. Excellent response to furosemide infusion.  ? ?-Now off inotropes. ?-Continue furosemide infusion for volume overload again today.  ?- Transition to intermittent diuretic tomorrow.  ?

## 2022-01-04 NOTE — Evaluation (Signed)
Occupational Therapy Evaluation ?Patient Details ?Name: Deborah Cooke ?MRN: [id number] ?DOB: [birth year] ?Today's Date: 01/04/2022 ? ? ?History of Present Illness 71 y.o. F who presents with severe volume anasarca volume overload, dyspnea on exertion, hypotension. 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  ? ?Clinical Impression ?  ?PTA, pt was living with her husband and requiring assistance for ADLs after recent dc from rehab [facility]). Pt currently requiring Mod A for UB ADLs, Max A for LB ADLs, and Min Guard-Min A +2 for short distance mobility with RW. Pt presenting with decreased balance, strength, and activity tolerance. Pt would benefit from further acute OT to facilitate safe dc. Recommend dc to SNF for further OT to optimize safety, independence with ADLs, and return to PLOF.  ?   ? ?Recommendations for follow up therapy are one component of a multi-disciplinary discharge planning process, led by the attending physician.  Recommendations may be updated based on patient status, additional functional criteria and insurance authorization.  ? ?Follow Up Recommendations ? Skilled nursing-short term rehab (<3 hours/day)  ?  ?Assistance Recommended at Discharge Frequent or constant Supervision/Assistance  ?Patient can return home with the following A little help with walking and/or transfers;A lot of help with bathing/dressing/bathroom ? ?  ?Functional Status Assessment ? Patient has had a recent decline in their functional status and demonstrates the ability to make significant improvements in function in a reasonable and predictable amount of time.  ?Equipment Recommendations ? Other (comment) (Defer to next venue)  ?  ?Recommendations for Other Services PT consult ? ? ?  ?Precautions / Restrictions Precautions ?Precautions: Fall ?Restrictions ?Weight Bearing Restrictions: No  ? ?  ? ?Mobility Bed Mobility ?Overal bed mobility:  Needs Assistance ?Bed Mobility: Supine to Sit ?  ?  ?Supine to sit: Mod assist, +2 for physical assistance ?  ?  ?General bed mobility comments: Assist for BLE's off edge of bed, use of bed pad to scoot hips forward to edge ?  ? ?Transfers ?Overall transfer level: Needs assistance ?Equipment used: Rolling walker (2 wheels) ?Transfers: Sit to/from Stand ?Sit to Stand: Min assist, +2 safety/equipment ?  ?  ?  ?  ?  ?General transfer comment: Light minA to rise from edge of bed ?  ? ?  ?Balance Overall balance assessment: Needs assistance ?Sitting-balance support: Feet supported ?Sitting balance-Leahy Scale: Fair ?  ?  ?Standing balance support: Bilateral upper extremity supported ?Standing balance-Leahy Scale: Poor ?Standing balance comment: reliant on RW ?  ?  ?  ?  ?  ?  ?  ?  ?  ?  ?  ?   ? ?ADL either performed or assessed with clinical judgement  ? ?ADL Overall ADL's : Needs assistance/impaired ?Eating/Feeding: Set up;Sitting ?  ?Grooming: Set up;Sitting ?  ?Upper Body Bathing: Moderate assistance;Sitting ?  ?Lower Body Bathing: Maximal assistance;Sit to/from stand ?  ?Upper Body Dressing : Moderate assistance;Sitting ?  ?Lower Body Dressing: Maximal assistance;Sit to/from stand ?  ?Toilet Transfer: Min guard;Minimal assistance;+2 for safety/equipment;Ambulation;Rolling walker (2 wheels) ?  ?  ?  ?  ?  ?Functional mobility during ADLs: Min guard;Minimal assistance;Rolling walker (2 wheels) ?General ADL Comments: Decreased activity tolerance with significant dizziness. no nystamus  ? ? ? ?Vision   ?   ?   ?Perception   ?  ?Praxis   ?  ? ?Pertinent Vitals/Pain Pain Assessment ?Pain Assessment: No/denies pain  ? ? ? ?  Hand Dominance Right ?  ?Extremity/Trunk Assessment Upper Extremity Assessment ?Upper Extremity Assessment: Generalized weakness ?  ?Lower Extremity Assessment ?Lower Extremity Assessment: Defer to PT evaluation ?  ?Cervical / Trunk Assessment ?Cervical / Trunk Assessment: Other exceptions ?Cervical /  Trunk Exceptions: increased body habitus ?  ?Communication Communication ?Communication: No difficulties ?  ?Cognition Arousal/Alertness: Awake/alert ?Behavior During Therapy: [facility] for tasks assessed/performed ?Overall Cognitive Status: Impaired/Different from baseline ?Area of Impairment: Memory ?  ?  ?  ?  ?  ?  ?  ?  ?  ?  ?Memory: Decreased short-term memory ?  ?  ?  ?  ?General Comments: Cannot recall fall on 4/12 ?  ?  ?General Comments  BP slightly soft but stable throughout. SpO2 90s on 4L. HR 90-100s. SOn arriving at end of session ? ?  ?Exercises   ?  ?Shoulder Instructions    ? ? ?Home Living Family/patient expects to be discharged to:: Private residence ?Living Arrangements: Spouse/significant other ?Available Help at Discharge: Family;Available PRN/intermittently ?Type of Home: House ?Home Access: Level entry ?  ?  ?Home Layout: One level ?  ?  ?Bathroom Shower/Tub: Walk-in shower ?  ?Bathroom Toilet: Standard ?  ?  ?Home Equipment: Cane - single Barista (2 wheels) ?  ?  ?  ? ?  ?Prior Functioning/Environment Prior Level of Function : Needs assist ?  ?  ?  ?  ?  ?  ?Mobility Comments: using RW ?ADLs Comments: increased assist since d/c from rehab ?  ? ?  ?  ?OT Problem List: Decreased strength;Decreased range of motion;Decreased activity tolerance;Impaired balance (sitting and/or standing);Decreased knowledge of use of DME or AE;Decreased knowledge of precautions ?  ?   ?OT Treatment/Interventions: Self-care/ADL training;Therapeutic exercise;Energy conservation;DME and/or AE instruction;Therapeutic activities;Patient/family education  ?  ?OT Goals(Current goals can be found in the care plan section) Acute Rehab OT Goals ?Patient Stated Goal: Get stronger ?OT Goal Formulation: With patient ?Time For Goal Achievement: 01/18/22 ?Potential to Achieve Goals: Good  ?OT Frequency: Min 2X/week ?  ? ?Co-evaluation PT/OT/SLP Co-Evaluation/Treatment: Yes ?Reason for Co-Treatment: To address  functional/ADL transfers;For patient/therapist safety ?  ?OT goals addressed during session: ADL's and self-care ?  ? ?  ?AM-PAC OT "6 Clicks" Daily Activity     ?Outcome Measure Help from another person eating meals?: A Little ?Help from another person taking care of personal grooming?: A Little ?Help from another person toileting, which includes using toliet, bedpan, or urinal?: A Lot ?Help from another person bathing (including washing, rinsing, drying)?: A Lot ?Help from another person to put on and taking off regular upper body clothing?: A Lot ?Help from another person to put on and taking off regular lower body clothing?: A Lot ?6 Click Score: 14 ?  ?End of Session Equipment Utilized During Treatment: Gait belt;Oxygen;Rolling walker (2 wheels) ?Nurse Communication: Mobility status ? ?Activity Tolerance: Patient tolerated treatment well ?Patient left: in chair;with call bell/phone within reach;with family/visitor present ? ?OT Visit Diagnosis: Unsteadiness on feet (R26.81);Other abnormalities of gait and mobility (R26.89);Muscle weakness (generalized) (M62.81)  ?              ?Time: 3536-1443 ?OT Time Calculation (min): 33 min ?Charges:  OT General Charges ?$OT Visit: 1 Visit ?OT Evaluation ?$OT Eval Moderate Complexity: 1 Mod ? ?Jazel Nimmons MSOT, OTR/L ?Acute Rehab ?Pager: [phone] ?Office: [phone] ? ?Marguis Mathieson M Phoenicia Pirie ?01/04/2022, 11:17 AM ?

## 2022-01-04 NOTE — Progress Notes (Signed)
[facility] ADULT ICU REPLACEMENT PROTOCOL ? ? ?The patient does apply for the [facility] Adult ICU Electrolyte Replacment Protocol based on the criteria listed below:  ? ?1.Exclusion criteria: TCTS patients, ECMO patients, and Dialysis patients ?2. Is GFR >/= 30 ml/min? Yes.    ?Patient's GFR today is >60 ?3. Is SCr </= 2? Yes.   ?Patient's SCr is 0.93 mg/dL ?4. Did SCr increase >/= 0.5 in 24 hours? No. ?5.Pt's weight >40kg  Yes.   ?6. Abnormal electrolyte(s): K, mag  ?7. Electrolytes replaced per protocol ?8.  Call MD STAT for K+ </= 2.5, Phos </= 1, or Mag </= 1 ?Physician:  Oletta Darter ? ?Dray Dente E Anaih Brander 01/04/2022 5:54 AM  ?

## 2022-01-04 NOTE — Telephone Encounter (Signed)
? ?  Pt's son calling and wanted to let Dr. Irish Lack pt is in [facility] hospital ?

## 2022-01-04 NOTE — Assessment & Plan Note (Signed)
PT/OT

## 2022-01-04 NOTE — Progress Notes (Signed)
Heart Failure Navigator Progress Note ? ?Assessed for Heart & Vascular TOC clinic readiness.  ?Patient does not meet criteria due to already established with Advanced Heart Failure Team..  ? ? ? ?Earnestine Leys, BSN, RN ?Heart Failure Nurse Navigator ?Secure Chat Only   ?

## 2022-01-04 NOTE — [place] FL2 (Signed)
?[place] MEDICAID FL2 LEVEL OF CARE SCREENING TOOL  ?  ? ?IDENTIFICATION  ?Patient Name: ?Deborah Cooke Birthdate: [birth year] Sex: female Admission Date (Current Location): ?01/02/2022  ?[place] and [place] Number: ? [place] ?  Facility and Address:  ?[facility], [place] [address] [place], [place] [address] ?     Provider Number: ?[id number]  ?Attending Physician Name and Address:  ?Kipp Brood, MD ? Relative Name and Phone Number:  ?  ?   ?Current Level of Care: ?Hospital Recommended Level of Care: ?[place] Prior Approval Number: ?  ? ?Date Approved/Denied: ?  PASRR Number: ?[id number] A ? ?Discharge Plan: ?SNF ?  ? ?Current Diagnoses: ?Patient Active Problem List  ? Diagnosis Date Noted  ? Arterial hypotension   ? Acute cor pulmonale (HCC)   ? Acute on chronic congestive heart failure ([place]) 01/02/2022  ? Mood disorder ([place]) 01/02/2022  ? Atrial fibrillation ([place]) 12/15/2021  ? Hyperlipidemia LDL goal <70 12/14/2021  ? Chronic cor pulmonale ([place]) 12/14/2021  ? Coronary artery disease involving native coronary artery of native heart without angina pectoris 12/07/2021  ? Myoclonic jerking 11/27/2021  ? AKI (acute kidney injury) ([place]) 11/27/2021  ? Generalized weakness 11/27/2021  ? History of pulmonary embolism 11/26/2021  ? 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 Old MI (myocardial infarction)   ? S/P reverse total shoulder arthroplasty, right 10/20/2021  ? Rotator cuff arthropathy of right shoulder 09/22/2021  ? Type I or II open comminuted intra-articular fracture of distal end of left femur, initial encounter ([place]) 10/15/2020  ? Closed fracture of left distal femur ([place]) 10/14/2020  ? Encounter for therapeutic drug monitoring 04/21/2017  ? Family history of breast cancer   ? Malignant neoplasm of upper-outer quadrant of left breast in female, estrogen receptor positive ([place]) 03/11/2017  ? Essential  hypertension   ? Hypothyroidism   ? Osteoporosis   ? Anxiety   ? Acute blood loss anemia 09/19/2012  ? Chronic pain 09/19/2012  ? Osteoporosis with fracture 09/19/2012  ? Closed supracondylar fracture of right femur ([place]) 09/18/2012  ? ? ?Orientation RESPIRATION BLADDER Height & Weight   ?  ?Self, Time, Situation, Place ? Normal Continent, External catheter Weight: 262 lb 9.1 oz (119.1 kg) ?Height:     ?BEHAVIORAL SYMPTOMS/MOOD NEUROLOGICAL BOWEL NUTRITION STATUS  ?    Continent Diet (Please See DC Summary)  ?AMBULATORY STATUS COMMUNICATION OF NEEDS Skin   ?Limited Assist   Normal ?  ?  ?  ?    ?     ?     ? ? ?Personal Care Assistance Level of Assistance  ?Bathing, Feeding, Dressing Bathing Assistance: Limited assistance ?Feeding assistance: Limited assistance (Needs set up) ?Dressing Assistance: Limited assistance ?   ? ?Functional Limitations Info  ?Sight, Speech, Hearing Sight Info: Adequate ?Hearing Info: Adequate ?Speech Info: Adequate  ? ? ?SPECIAL CARE FACTORS FREQUENCY  ?PT (By licensed PT), OT (By licensed OT)   ?  ?PT Frequency: 5x/Week ?OT Frequency: 5x/Week ?  ?  ?  ?   ? ? ?Contractures Contractures Info: Not present  ? ? ?Additional Factors Info  ?Code Status, Allergies Code Status Info: FULL ?Allergies Info: Nsaids, Ibuprofen, Tolmetin ?  ?  ?  ?   ? ?Current Medications (01/04/2022):  This is the current hospital active medication list ?Current Facility-Administered Medications  ?Medication Dose Route Frequency Provider Last Rate Last  Admin  ? 0.9 %  sodium chloride infusion  250 mL Intravenous Continuous Etta Quill, DO      ? acetaminophen (TYLENOL) tablet 650 mg  650 mg Oral Q6H PRN Lenore Cordia, MD   650 mg at 01/03/22 2229  ? Or  ? acetaminophen (TYLENOL) suppository 650 mg  650 mg Rectal Q6H PRN Lenore Cordia, MD      ? carbamazepine (TEGRETOL XR) 12 hr tablet 400 mg  400 mg Oral BID Zada Finders R, MD   400 mg at 01/04/22 1022  ? Chlorhexidine Gluconate Cloth 2 % PADS 6 each  6 each  Topical Daily Etheleen Nicks, MD   6 each at 01/03/22 0600  ? clopidogrel (PLAVIX) tablet 75 mg  75 mg Oral Daily Zada Finders R, MD   75 mg at 01/04/22 1022  ? furosemide (LASIX) 200 mg in dextrose 5 % 100 mL (2 mg/mL) infusion  15 mg/hr Intravenous Continuous Bensimhon, Shaune Pascal, MD 7.5 mL/hr at 01/04/22 0300 15 mg/hr at 01/04/22 0300  ? levothyroxine (SYNTHROID) tablet 88 mcg  88 mcg Oral Q0600 Lenore Cordia, MD   88 mcg at 01/04/22 0604  ? liothyronine (CYTOMEL) tablet 25 mcg  25 mcg Oral Q0600 Lenore Cordia, MD   25 mcg at 01/04/22 0604  ? MEDLINE mouth rinse  15 mL Mouth Rinse BID Etheleen Nicks, MD   15 mL at 01/03/22 1047  ? midodrine (PROAMATINE) tablet 5 mg  5 mg Oral TID WC Bensimhon, Shaune Pascal, MD   5 mg at 01/04/22 1155  ? norepinephrine (LEVOPHED) '4mg'$  in 242m (0.016 mg/mL) premix infusion  0-40 mcg/min Intravenous Titrated IEtheleen Nicks MD 7.5 mL/hr at 01/04/22 0300 2 mcg/min at 01/04/22 0300  ? ondansetron (ZOFRAN) tablet 4 mg  4 mg Oral Q6H PRN PLenore Cordia MD      ? Or  ? ondansetron (ZOFRAN) injection 4 mg  4 mg Intravenous Q6H PRN PLenore Cordia MD      ? pantoprazole (PROTONIX) EC tablet 40 mg  40 mg Oral Daily PLenore Cordia MD   40 mg at 01/04/22 1022  ? potassium chloride SA (KLOR-CON M) CR tablet 40 mEq  40 mEq Oral Once WLyndee Leo [facility]     ? rosuvastatin (CRESTOR) tablet 10 mg  10 mg Oral Daily PLenore Cordia MD   10 mg at 01/04/22 1022  ? senna-docusate (Senokot-S) tablet 1 tablet  1 tablet Oral QHS PRN PLenore Cordia MD      ? sodium chloride flush (NS) 0.9 % injection 10-40 mL  10-40 mL Intracatheter Q12H CNoemi ChapelP, DO   10 mL at 01/03/22 1510  ? sodium chloride flush (NS) 0.9 % injection 10-40 mL  10-40 mL Intracatheter PRN CNoemi ChapelP, DO      ? sodium chloride flush (NS) 0.9 % injection 3 mL  3 mL Intravenous Q12H PZada FindersR, MD      ? warfarin (COUMADIN) tablet 5 mg  5 mg Oral ONCE-1600 WLyndee Leo [facility]     ? Warfarin - Pharmacist Dosing  Inpatient   Does not apply q1600 PLenore Cordia MD   Given at 01/03/22 1654  ? ? ? ?Discharge Medications: ?Please see discharge summary for a list of discharge medications. ? ?Relevant Imaging Results: ? ?Relevant Lab Results: ? ? ?Additional Information ?SSN#: [id number]? ?Norely Schlick, LCSW ? ? ? ? ?

## 2022-01-04 NOTE — Assessment & Plan Note (Signed)
Pulmonary embolism in 10/2021 ? ?-Continue home Coumadin. ?

## 2022-01-05 ENCOUNTER — Ambulatory Visit: Payer: Medicare Other | Admitting: Physician Assistant

## 2022-01-05 DIAGNOSIS — I2609 Other pulmonary embolism with acute cor pulmonale: Secondary | ICD-10-CM | POA: Diagnosis not present

## 2022-01-05 DIAGNOSIS — I50813 Acute on chronic right heart failure: Secondary | ICD-10-CM | POA: Diagnosis not present

## 2022-01-05 DIAGNOSIS — I2510 Atherosclerotic heart disease of native coronary artery without angina pectoris: Secondary | ICD-10-CM | POA: Diagnosis not present

## 2022-01-05 DIAGNOSIS — N179 Acute kidney failure, unspecified: Secondary | ICD-10-CM | POA: Diagnosis not present

## 2022-01-05 LAB — BASIC METABOLIC PANEL
Anion gap: 10 (ref 5–15)
BUN: 21 mg/dL (ref 8–23)
CO2: 39 mmol/L — ABNORMAL HIGH (ref 22–32)
Calcium: 8.2 mg/dL — ABNORMAL LOW (ref 8.9–10.3)
Chloride: 90 mmol/L — ABNORMAL LOW (ref 98–111)
Creatinine, Ser: 0.9 mg/dL (ref 0.44–1.00)
GFR, Estimated: >60 mL/min (ref >60)
Glucose, Bld: 107 mg/dL — ABNORMAL HIGH (ref 70–99)
Potassium: 3.6 mmol/L (ref 3.5–5.1)
Sodium: 139 mmol/L (ref 135–145)

## 2022-01-05 LAB — MAGNESIUM: Magnesium: 2.1 mg/dL (ref 1.7–2.4)

## 2022-01-05 LAB — PROTIME-INR
INR: 2 — ABNORMAL HIGH (ref 0.8–1.2)
Prothrombin Time: 22.9 seconds — ABNORMAL HIGH (ref 11.4–15.2)

## 2022-01-05 LAB — CBC
HCT: 34.4 % — ABNORMAL LOW (ref 36.0–46.0)
Hemoglobin: 11 g/dL — ABNORMAL LOW (ref 12.0–15.0)
MCH: 30.2 pg (ref 26.0–34.0)
MCHC: 32 g/dL (ref 30.0–36.0)
MCV: 94.5 fL (ref 80.0–100.0)
Platelets: 388 10*3/uL (ref 150–400)
RBC: 3.64 MIL/uL — ABNORMAL LOW (ref 3.87–5.11)
RDW: 14.5 % (ref 11.5–15.5)
WBC: 5.5 10*3/uL (ref 4.0–10.5)
nRBC: 0 % (ref 0.0–0.2)

## 2022-01-05 LAB — COOXEMETRY PANEL
Carboxyhemoglobin: 1 % (ref 0.5–1.5)
Methemoglobin: <0.7 % (ref 0.0–1.5)
O2 Saturation: 76.1 %
Total hemoglobin: 11.4 g/dL — ABNORMAL LOW (ref 12.0–16.0)

## 2022-01-05 LAB — T4, FREE: Free T4: 0.74 ng/dL (ref 0.61–1.12)

## 2022-01-05 LAB — TSH: TSH: 9.721 u[IU]/mL — ABNORMAL HIGH (ref 0.350–4.500)

## 2022-01-05 MED ORDER — POTASSIUM CHLORIDE CRYS ER 20 MEQ PO TBCR
40.0000 meq | EXTENDED_RELEASE_TABLET | Freq: Once | ORAL | Status: AC
  Administered 2022-01-05: 40 meq via ORAL
  Filled 2022-01-05: qty 2

## 2022-01-05 MED ORDER — ACETAZOLAMIDE SODIUM 500 MG IJ SOLR
250.0000 mg | Freq: Two times a day (BID) | INTRAMUSCULAR | Status: AC
  Administered 2022-01-05 (×2): 250 mg via INTRAVENOUS
  Filled 2022-01-05 (×2): qty 250

## 2022-01-05 MED ORDER — WARFARIN SODIUM 2 MG PO TABS
4.0000 mg | ORAL_TABLET | Freq: Once | ORAL | Status: AC
  Administered 2022-01-05: 4 mg via ORAL
  Filled 2022-01-05: qty 2

## 2022-01-05 MED ORDER — WARFARIN SODIUM 5 MG PO TABS: 5.0000 mg | ORAL_TABLET | Freq: Once | ORAL | Status: DC

## 2022-01-05 MED ORDER — STERILE WATER FOR INJECTION IJ SOLN
INTRAMUSCULAR | Status: AC
  Administered 2022-01-05: 2.5 mL
  Filled 2022-01-05: qty 10

## 2022-01-05 NOTE — Progress Notes (Addendum)
? ? Advanced Heart Failure Rounding Note ? ?PCP-Cardiologist: Larae Grooms, MD  ? ?Subjective:   ? ?Co-ox pending this AM, NE gtt d/c'd 4/17 AM. ? ?On lasix gtt at 15/hr. 8.4L UOP + 1 unmeasured void over the last 24 h. 4/16 wt 262 lbs>>228 lbs this AM. ? ?CVP 9-11 ? ?Scr 1.20>1.13>0.93>0.9 ? ?Bicarb 27>34>39 ?K 3.6 and Mg 2.1 ? ?No dyspnea at rest, no orthopnea or PND. Notes HA in the occiput which has been ongoing since before this hospitalization.  ? ? ? ?Objective:   ?Weight Range: ?103.5 kg ?Body mass index is 46.09 kg/m?.  ? ?Vital Signs:   ?Temp:  [98.5 ?F (36.9 ?C)-98.8 ?F (37.1 ?C)] 98.5 ?F (36.9 ?C) (04/18 4008) ?Pulse Rate:  [92-107] 104 (04/18 0900) ?Resp:  [11-21] 11 (04/18 0900) ?BP: (92-125)/(59-88) 113/72 (04/18 0900) ?SpO2:  [73 %-100 %] 99 % (04/18 0900) ?Weight:  [103.5 kg] 103.5 kg (04/18 0500) ?Last BM Date : 01/05/22 ? ?Weight change: ?Filed Weights  ? 01/03/22 0500 01/05/22 0500  ?Weight: 119.1 kg 103.5 kg  ? ? ?Intake/Output:  ? ?Intake/Output Summary (Last 24 hours) at 01/05/2022 0944 ?Last data filed at 01/05/2022 0900 ?Gross per 24 hour  ?Intake 724.23 ml  ?Output 9800 ml  ?Net -9075.77 ml  ? ?  ? ? ?Physical Exam  ?  ?Constitutional: Well-developed, well-nourished, and in no distress.  ?HENT:  ?Head: Normocephalic and atraumatic.  ?Neck: Normal range of motion, TTP at base of skull, JVP to jaw  ?Cardiovascular: Tachycardic rate, regular rhythm, intact distal pulses. No gallop and no friction rub.  ?2/6 TR murmur. 2+ edema (unna boots in place)  ?Pulmonary: Non labored breathing on 5L [place], diminished breath sounds d/t body habitus ?Abdominal: Soft. Obese. Normal bowel sounds. Non tender, mildly distended  ?Musculoskeletal: Normal range of motion.     ?   General: Mild TTP, 2+ edema  ?Neurological: Alert and oriented to person, place, and time. Non focal  ?Skin: Skin is warm and dry.  ? ? ?Telemetry  ? ?Sinus tachycardia, frequent PVCs ? ? ?Labs  ?  ?CBC ?Recent Labs  ?  01/04/22 ?0451  01/05/22 ?6761  ?WBC 5.3 5.5  ?HGB 9.9* 11.0*  ?HCT 30.2* 34.4*  ?MCV 94.4 94.5  ?PLT 346 388  ? ? ?Basic Metabolic Panel ?Recent Labs  ?  01/04/22 ?0451 01/05/22 ?9509  ?NA 138 139  ?K 3.4* 3.6  ?CL 97* 90*  ?CO2 34* 39*  ?GLUCOSE 141* 107*  ?BUN 24* 21  ?CREATININE 0.93 0.90  ?CALCIUM 8.2* 8.2*  ?MG 1.8 2.1  ? ? ?Liver Function Tests ?Recent Labs  ?  01/02/22 ?2140  ?AST 33  ?ALT 14  ?ALKPHOS 121  ?BILITOT 1.0  ?PROT 5.4*  ?ALBUMIN 2.8*  ? ? ?No results for input(s): LIPASE, AMYLASE in the last 72 hours. ?Cardiac Enzymes ?No results for input(s): CKTOTAL, CKMB, CKMBINDEX, TROPONINI in the last 72 hours. ? ?BNP: ?BNP (last 3 results) ?Recent Labs  ?  11/26/21 ?1119 01/02/22 ?1813  ?BNP 482.4* 405.8*  ? ? ? ?ProBNP (last 3 results) ?No results for input(s): PROBNP in the last 8760 hours. ? ? ?D-Dimer ?No results for input(s): DDIMER in the last 72 hours. ?Hemoglobin A1C ?No results for input(s): HGBA1C in the last 72 hours. ?Fasting Lipid Panel ?No results for input(s): CHOL, HDL, LDLCALC, TRIG, CHOLHDL, LDLDIRECT in the last 72 hours. ?Thyroid Function Tests ?No results for input(s): TSH, T4TOTAL, T3FREE, THYROIDAB in the last 72 hours. ? ?Invalid input(s):  FREET3 ? ?Other results: ? ? ?Imaging  ? ? ?No results found. ? ? ?Medications:   ? ? ?Scheduled Medications: ? acetaZOLAMIDE  250 mg Intravenous BID  ? carbamazepine  400 mg Oral BID  ? Chlorhexidine Gluconate Cloth  6 each Topical Daily  ? clopidogrel  75 mg Oral Daily  ? levothyroxine  88 mcg Oral Q0600  ? liothyronine  25 mcg Oral Q0600  ? mouth rinse  15 mL Mouth Rinse BID  ? midodrine  5 mg Oral TID WC  ? pantoprazole  40 mg Oral Daily  ? rosuvastatin  10 mg Oral Daily  ? sodium chloride flush  10-40 mL Intracatheter Q12H  ? sodium chloride flush  3 mL Intravenous Q12H  ? Warfarin - Pharmacist Dosing Inpatient   Does not apply [id number]  ? ? ?Infusions: ? sodium chloride    ? furosemide (LASIX) 200 mg in dextrose 5% 100 mL (74m/mL) infusion 15 mg/hr (01/05/22  0900)  ? norepinephrine (LEVOPHED) Adult infusion Stopped (01/04/22 0849)  ? ? ?PRN Medications: ?acetaminophen **OR** acetaminophen, ondansetron **OR** ondansetron (ZOFRAN) IV, oxyCODONE-acetaminophen, senna-docusate, sodium chloride flush ? ? ? ?Assessment/Plan  ? ?1. Hypotension due to a/c cor pulmonale- Resolved  ?- NE gtt d/c'd 4/17 AM. MAPs>73 since. SBP >90.  ?- Continues to have some lightheadedness with going from lying to seated position per PT note, orthostatic vitals were normal however  ?- Continue 5 mg midodrine TID. Continue to hold metoprolol in the setting of soft blood pressures ?  ?2. 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 RV okay, moderate to severe TR, mild AS with mean gradient of 10 mmHg, severe MAC with mild mitral stenosis mean gradient 5 mmHg ?- Hypervolemia improved. Net -7.5L over the last 24h. Wt  4/16 262.5lbs>>228 lbs this AM (4/18). CVP 9-11. Diuresing well with lasix gtt at 15/hr. Decrease lasix gtt to 12 mg/h and add diamox given concern for contraction alkalosis.  ?- CO-OX 60.5 off of NE 4/17 AM, will recheck CO-OX this AM  ?- Mg WNL, repleted K (3.6) ?- Plan for RHC in the AM  ?- Continue CVP monitoring ?- Continue UNNA boots  ?- Will need cpap and weight loss ?- GDMT limited by soft BP and positional dizziness  ?- PT/OT  ? ?3. Valvular heart disease, moderate to severe TR ?- Echo this admit with EF 65-70%, moderate to severe TR, mild AS with mean gradient 10 mmHg and mild mitral stenosis mean gradient 5 mmHg.  ?  ?4. CAD s/p 4v CABG 2/23 (native SVG to LAD, allograft SVG to posterior descending, allograft SVG to OM1 and OM2, s/p RCA thrombectomy) ?- Denies anginal symptoms at rest ?- Continue plavix long-term (cryo graft) and rosuvastatin 158m ?  ?5. Acute hypoxic/hypercapnic respiratory failure, likely due to OHS, OSA  ?- plan per CCM ?  ?6. Morbid  obesity ?- s/p gastric bypass ?- will need weight loss ?- consider GLP1RA ?  ?7. Falls/weakness ?- PT/OT  ?- not safe to live at home currently. PT recommending SNF ?  ?8. PE ?- Discovered 11/2021  ?- continue warfarin ?- not on DOAC with use of carbamzepine ?- INR 2.0 ? ?9. Anemia ?- Hemoglobin stable  ?- Monitor ? ?Length of Stay: 3 ? ?PrRick DuffMD  ?01/05/2022, 9:44 AM ? ?Advanced Heart Failure Team ?Pager [phone]-F; 7a - 5p)  ?  Please contact [facility] for night-coverage after hours (5p -7a ) and weekends on [email or web address] ? ?Patient seen and examined with the above-signed Advanced Practice Provider and/or Housestaff. I personally reviewed laboratory data, imaging studies and relevant notes. I independently examined the patient and formulated the important aspects of the plan. I have edited the note to reflect any of my changes or salient points. I have personally discussed the plan with the patient and/or family. ? ?NE stopped. Diuresing briskly on IV lasix. Breathing better. Denies SOB, orthopnea or PND. Remains weak. Renal function improving. CO-ox 76% CVP ~10  ? ?General:  Lying in bed No resp difficulty ?HEENT: normal ?Neck: supple. JVP hard to see Carotids 2+ bilat; no bruits. No lymphadenopathy or thryomegaly appreciated. ?Cor: PMI nondisplaced. Regular rate & rhythm. 2/6 TR ?Lungs: clear ?Abdomen: obese soft, nontender, nondistended. No hepatosplenomegaly. No bruits or masses. Good bowel sounds. ?Extremities: no cyanosis, clubbing, rash, 2+ edema UNNA boots  ?Neuro: alert & orientedx3, cranial nerves grossly intact. moves all 4 extremities w/o difficulty. Affect pleasant ? ?Diuresing well. Remains volume overloaded. Continue IV diuresis. Add diamox. Continue midodrine for BP support. Continue warfarin. Plan RHC tomorrow. Continue aggressive rehab efforts.  ? ?Glori Bickers, MD  ?9:11 PM ? ?  ? ? ?

## 2022-01-05 NOTE — Progress Notes (Signed)
? ?NAME:  Deborah Cooke, MRN:  [id number], DOB:  Apr 26, 1951, LOS: 3 ?ADMISSION DATE:  01/02/2022, CONSULTATION DATE: January 03, 2022 ?REFERRING MD: Hospitalist service, CHIEF COMPLAINT: Low blood pressure ? ?History of Present Illness:  ?Patient 71 year old female with past medical history of coronary artery disease CABG PE on Coumadin hypothyroidism who presented with severe volume anasarca volume overload dyspnea on exertion she was given a dose of Lasix she was diuresing very well in the ED later on in the course patient received all of her medications including gabapentin 300 mg of Seroquel oxycodone later on in the course she became hypotensive she was given some fluids back with no improvement she was put on pressors for systolic blood pressure of 60.  Patient had no fever chills rigors no nausea vomiting diarrhea.  Shortness of breath x10 days.  Had a STEMI with cardiogenic shock requiring IABP, severe 3-vessel disease > CABG x4 in February 2023. She had IABP removed POD4, inotropes weaned subsequently. She was diagnosed with PE in early March and acute heart failure in March as well.  Had issues with low BPs and difficulty tolerating diuresis-- only tolerating metoprolol succ 12.'5mg'$  daily. 15 pound weight gain in the last week. Did poorly after discharge from SNF rehab post-hospitalization.  Her son reports that she has recent memory impairment for the past few months and he is concern for dementia. ? ?On 4/12 she had a fall at home due to tripping when walking with her walker.  She fell and hit her head on concrete.  She is chronically on Coumadin.  In the ED that day and discharged home.  Mobility has worsened since her fall. ? ?119 kg at admission, was 106kg on 3/28. ? ?PMH:  ?Breast cancer in 2018 ?Chronic cor pulmonale ?PE March 2023- on coumadin ?GERD ?Hypertension ?Hypothyroidism ?Osteoporosis ?Chronic opiate use ? ?Hospital course:  ?4/15 admitted, upgraded to ICU overnight ?4/17 Diuresing well, awake  and on Lloyd, off Levophed,  ?4/18 8L UOP yesterday, lasix gtt titrated down, remains off pressors ? ? ?Interim history:  ?No complaints today other than HA which is chronic ?Diuresing will ?8L UOP yesterday, creatinine improving ? ? ?Objective   ?Blood pressure 113/69, pulse (!) 103, temperature 98.5 ?F (36.9 ?C), temperature source Oral, resp. rate 13, weight 103.5 kg, SpO2 100 %. ?CVP:  [10 mmHg-14 mmHg] 12 mmHg  ?   ? ?Intake/Output Summary (Last 24 hours) at 01/05/2022 1107 ?Last data filed at 01/05/2022 1000 ?Gross per 24 hour  ?Intake 971.13 ml  ?Output 9800 ml  ?Net -8828.87 ml  ? ? ?Filed Weights  ? 01/03/22 0500 01/05/22 0500  ?Weight: 119.1 kg 103.5 kg  ? ?General:  chronically ill-appearing F, resting in bed in no distress ?HEENT: MM pink/moist, sclera anicterc ?Neuro: awake, alert, oriented and in no distress ?CV: s1s2 rrr, no m/r/g ?PULM:  lungs clear with good air entry bilaterally on [place], no distress ?GI: soft, bsx4 active  ?Extremities: warm/dry, 2+ edema, LE bandaged bilaterally ?Skin: no rashes or lesions ? ? ?Labs reviewed  ?Coox 76% ?Mag 2.1 ?K 3.6 ?Creatinine 0.90 ? ?Assessment & Plan:  ? ? ? ?Hypotension, acute on chronic cor pulmonale ?Thought to be secondary to chronic right heart failure, possibly also left heart failure with polypharmacy contributing. ?Improving ?-no orthostasis with PT yesterday ?-remains off pressors ?-continue Midodrine ?- Aggressive treatment of right heart failure.  Diuresis with Lasix infusion. ?-EF 65-70% on echo, mod to severe TR ?- Hold metoprolol ?-Unna boots ?-Planning for  right heart catheterization 4/19.  Appreciate cardiology's management. ?- Nocturnal BiPAP; needs outpatient sleep study and NIPPV ?-Cooximetry 76% today ?- Needs to prioritize long-term weight loss as a goal.  ? ?Acute hypoxic and hypercapnic respiratory failure, likely chronic hypercapnia due to OHS, OSA, possibly also acute pulmonary edema. ?Recent PE ?- continue supplemental oxygen to maintain  SPO2 greater than 88%. ?- Anticipate she needs nocturnal noninvasive positive pressure ventilation, needs outpatient sleep study. ?- Long-term weight loss. ?- Diuresis as above  ?-Continue Coumadin, dosing per pharmacy.  Cannot tolerate DOAC with carbamazepine. ? ? ?CAD,  ?s/p CABG in Feb 2023 ?- Needs Plavix indefinitely due to cryo graft ?- continue Aspirin, statin ?- continue Holding metoprolol due to hypotension; has generally been intolerant to beta-blockers from hypotension at home. ? ?Acute encephalopathy; some concern for dementia with recent memory loss over the last few months at home ?Improving  ?- Ammonia level ok ?- Avoid sedating medications as able, requesting pain medications ?-SW following, pt's spouse is unable to care for pt with current needs, working on SNF placement ? ? ?AKI,  ?likely cardiorenal ?Improving with diuresis ?-Strict I's/O ?- Renally dose meds and avoid nephrotoxic meds ?- Diuresis ?- Maintain adequate renal perfusion. ? ?Anemia due to chronic disease ?-Transfuse for hemoglobin less than 7 or hemodynamically significant bleeding ?- stable, continue  to monitor ? ?Deconditioning ?- Needs aggressive PT, OT. ?- SW seeking SNF placement ? ? ? ? ?Labs   ?CBC: ?Recent Labs  ?Lab 12/30/21 ?1640 01/02/22 ?1813 01/03/22 ?0430 01/04/22 ?0451 01/05/22 ?3875  ?WBC 6.6 6.6 5.9 5.3 5.5  ?NEUTROABS 4.3  --   --   --   --   ?HGB 11.3* 11.0* 9.1* 9.9* 11.0*  ?HCT 36.0 34.6* 28.8* 30.2* 34.4*  ?MCV 97.0 96.1 95.0 94.4 94.5  ?PLT 310 372 310 346 388  ? ? ? ?Basic Metabolic Panel: ?Recent Labs  ?Lab 12/30/21 ?1640 01/02/22 ?1813 01/03/22 ?0430 01/04/22 ?0451 01/05/22 ?6433  ?NA 137 135 135 138 139  ?K 4.9 4.8 5.0 3.4* 3.6  ?CL 103 101 102 97* 90*  ?CO2 '24 24 27 '$ 34* 39*  ?GLUCOSE 94 110* 83 141* 107*  ?BUN 22 33* 32* 24* 21  ?CREATININE 0.84 1.20* 1.13* 0.93 0.90  ?CALCIUM 8.7* 8.8* 8.0* 8.2* 8.2*  ?MG  --   --  1.8 1.8 2.1  ? ? ?GFR: ?Estimated Creatinine Clearance: 59.1 mL/min (by C-G formula based  on SCr of 0.9 mg/dL). ?Recent Labs  ?Lab 01/02/22 ?1813 01/03/22 ?0430 01/03/22 ?0745 01/04/22 ?0451 01/05/22 ?2951  ?WBC 6.6 5.9  --  5.3 5.5  ?LATICACIDVEN  --  1.3 1.9  --   --   ? ? ? ?Liver Function Tests: ?Recent Labs  ?Lab 01/02/22 ?2140  ?AST 33  ?ALT 14  ?ALKPHOS 121  ?BILITOT 1.0  ?PROT 5.4*  ?ALBUMIN 2.8*  ? ? ?No results for input(s): LIPASE, AMYLASE in the last 168 hours. ?Recent Labs  ?Lab 01/03/22 ?1104  ?AMMONIA 29  ? ? ? ?ABG ?   ?Component Value Date/Time  ? PHART 7.44 11/26/2021 2342  ? PCO2ART 47 11/26/2021 2342  ? PO2ART 91 11/26/2021 2342  ? HCO3 32.0 (H) 11/26/2021 2342  ? TCO2 32 11/02/2021 0347  ? ACIDBASEDEF 1.0 10/28/2021 0036  ? O2SAT 76.1 01/05/2022 1005  ? ?  ? ?Coagulation Profile: ?Recent Labs  ?Lab 12/30/21 ?1640 01/02/22 ?1813 01/03/22 ?1047 01/04/22 ?0451 01/05/22 ?0427  ?INR 2.3* 1.9* 1.9* 2.0* 2.0*  ? ? ?Best Practice (  right click and "Reselect all SmartList Selections" daily)  ? ?Diet/type: Regular consistency (see orders) ?DVT prophylaxis: other, coumadin ?GI prophylaxis: N/A ?Lines: yes and it is still needed ?Foley:  Yes, and it is still needed ?Code Status:  full code ?Last date of multidisciplinary goals of care discussion [pending ] ? ? ? ?Otilio Carpen Dudley Mages, PA-C ?[facility] ?See Amion for pager ?If no response to pager , please call [phone] until 7pm ?After 7:00 pm call Elink  [phone] ? ? ? ?  ?

## 2022-01-05 NOTE — TOC Progression Note (Addendum)
Transition of Care (TOC) - Progression Note  ? ? ?Patient Details  ?Name: Deborah Cooke ?MRN: [id number] ?Date of Birth: 1950/11/19 ? ?Transition of Care (TOC) CM/SW Contact  ?Noemy Hallmon, LCSW ?Phone Number: ?01/05/2022, 11:03 AM ? ?Clinical Narrative:    ?HF CSW spoke with patient and her son, Remo Lipps at bedside and provided bed offers and they reported a preference for [facility]. [name] reported that she is having a RHC tomorrow and after that will see what they say about progressing to the next transition. ?11:05am - [facility] CSW reached out to [facility] and they reported they do have a bed available for [name] at time of discharge. ? ?CSW will continue to follow throughout discharge. ? ? ?Expected Discharge Plan: [place] ?Barriers to Discharge: Continued Medical Work up ? ?Expected Discharge Plan and Services ?Expected Discharge Plan: [place] ?  ?  ?Post Acute Care Choice: [place] ?Living arrangements for the past 2 months: [place] ?                ?  ?  ?  ?  ?  ?  ?  ?  ?  ?  ? ? ?Social Determinants of Health (SDOH) Interventions ?Housing Interventions: Intervention Not Indicated ?Transportation Interventions: Intervention Not Indicated ? ?Readmission Risk Interventions ?   ? View : No data to display.  ?  ?  ?  ? ?[place], MSW, LCSW ?[phone] ?Heart Failure Social Worker  ?

## 2022-01-05 NOTE — Assessment & Plan Note (Signed)
-   Continue current regimen. Avoid restarting other medications on list  ?

## 2022-01-05 NOTE — Assessment & Plan Note (Addendum)
Currently rate controlled.  ?On warfarin for stroke prevention. ?

## 2022-01-05 NOTE — Progress Notes (Signed)
[facility] ADULT ICU REPLACEMENT PROTOCOL ? ? ?The patient does apply for the [facility] Adult ICU Electrolyte Replacment Protocol based on the criteria listed below:  ? ?1.Exclusion criteria: TCTS patients, ECMO patients, and Dialysis patients ?2. Is GFR >/= 30 ml/min? Yes.    ?Patient's GFR today is >60 ?3. Is SCr </= 2? Yes.   ?Patient's SCr is 0.90 mg/dL ?4. Did SCr increase >/= 0.5 in 24 hours? No. ?5.Pt's weight >40kg  Yes.   ?6. Abnormal electrolyte(s): K+3.6  ?7. Electrolytes replaced per protocol ?8.  Call MD STAT for K+ </= 2.5, Phos </= 1, or Mag </= 1 ?Physician:  Oletta Darter ? ?Margaret Pyle 01/05/2022 5:34 AM  ?

## 2022-01-05 NOTE — Plan of Care (Signed)
?  Problem: Activity: ?Goal: Capacity to carry out activities will improve ?Outcome: Progressing ?  ?Problem: Cardiac: ?Goal: Ability to achieve and maintain adequate cardiopulmonary perfusion will improve ?Outcome: Progressing ?  ?Problem: Clinical Measurements: ?Goal: Diagnostic test results will improve ?Outcome: Progressing ?Goal: Respiratory complications will improve ?Outcome: Progressing ?Goal: Cardiovascular complication will be avoided ?Outcome: Progressing ?  ?Problem: Activity: ?Goal: Risk for activity intolerance will decrease ?Outcome: Progressing ?  ?Problem: Nutrition: ?Goal: Adequate nutrition will be maintained ?Outcome: Progressing ?  ?Problem: Coping: ?Goal: Level of anxiety will decrease ?Outcome: Progressing ?  ?Problem: Elimination: ?Goal: Will not experience complications related to bowel motility ?Outcome: Progressing ?Goal: Will not experience complications related to urinary retention ?Outcome: Progressing ?  ?Problem: Pain Managment: ?Goal: General experience of comfort will improve ?Outcome: Progressing ?  ?

## 2022-01-05 NOTE — Progress Notes (Addendum)
ANTICOAGULATION CONSULT NOTE - Follow Up Consult ? ?Pharmacy Consult for Warfarin ?Indication: pulmonary embolus - recent 3/23 ? ?Allergies  ?Allergen Reactions  ? Nsaids Nausea And Vomiting and Other (See Comments)  ?  GI Upset (ibuprofen included)  ? Ibuprofen Other (See Comments)  ?  Other reaction(s): Abdominal Pain  ? Tolmetin Other (See Comments)  ?  Other reaction(s): Abdominal Pain ?  ? ? ?Patient Measurements: ?Weight: 103.5 kg (228 lb 2.8 oz) ? ? ?Vital Signs: ?Temp: 98.5 ?F (36.9 ?C) (04/18 0630) ?Temp Source: Oral (04/18 1601) ?BP: 107/78 (04/18 1100) ?Pulse Rate: 106 (04/18 1100) ? ?Labs: ?Recent Labs  ?  01/02/22 ?1813 01/02/22 ?2140 01/03/22 ?0430 01/03/22 ?1047 01/04/22 ?0451 01/05/22 ?0427  ?HGB 11.0*  --  9.1*  --  9.9* 11.0*  ?HCT 34.6*  --  28.8*  --  30.2* 34.4*  ?PLT 372  --  310  --  346 388  ?LABPROT 21.8*  --   --  21.3* 22.5* 22.9*  ?INR 1.9*  --   --  1.9* 2.0* 2.0*  ?CREATININE 1.20*  --  1.13*  --  0.93 0.90  ?TROPONINIHS 54* 48*  --   --   --   --   ? ? ? ?Estimated Creatinine Clearance: 59.1 mL/min (by C-G formula based on SCr of 0.9 mg/dL). ? ? ?Assessment: ?71 year old female admitted with dyspnea to continue warfarin for PE diagnosed in March.   ?INR on admission 1.9. INR remains 2.0 today. Will give slightly higher dose again tonight. Hgb 11, plts stable. No s/x of bleeding noted. ? ?Dose PTA - > 4.5 mg Tuesday and Thursday, 3 mg MWFSS ? ?Goal of Therapy:  ?INR 2-3 ?Monitor platelets by anticoagulation protocol: Yes ?  ?Plan:  ?Planning RHC tomorrow 4/19, will give slightly lower dose today - Warfarin 4 mg po x 1 dose today ?Daily INR and CBC ? ?Erin Hearing PharmD., BCPS ?Clinical Pharmacist ?01/05/2022 11:44 AM ? ?

## 2022-01-05 NOTE — Plan of Care (Signed)
?  Problem: Education: ?Goal: Ability to demonstrate management of disease process will improve ?Outcome: Progressing ?Goal: Ability to verbalize understanding of medication therapies will improve ?Outcome: Progressing ?Goal: Individualized Educational Video(s) ?Outcome: Progressing ?  ?Problem: Activity: ?Goal: Capacity to carry out activities will improve ?Outcome: Progressing ?  ?Problem: Cardiac: ?Goal: Ability to achieve and maintain adequate cardiopulmonary perfusion will improve ?Outcome: Progressing ?  ?Problem: Education: ?Goal: Knowledge of General Education information will improve ?Description: Including pain rating scale, medication(s)/side effects and non-pharmacologic comfort measures ?Outcome: Progressing ?  ?Problem: Health Behavior/Discharge Planning: ?Goal: Ability to manage health-related needs will improve ?Outcome: Progressing ?  ?

## 2022-01-06 ENCOUNTER — Ambulatory Visit (HOSPITAL_COMMUNITY): Admission: RE | Admit: 2022-01-06 | Payer: Medicare Other | Source: Ambulatory Visit | Admitting: Internal Medicine

## 2022-01-06 ENCOUNTER — Encounter (HOSPITAL_COMMUNITY)
Admission: EM | Disposition: A | Discharge status: Skilled Nursing Facility | Payer: Self-pay | Source: Home / Self Care | Attending: Pulmonary Disease

## 2022-01-06 DIAGNOSIS — I50810 Right heart failure, unspecified: Secondary | ICD-10-CM

## 2022-01-06 HISTORY — PX: RIGHT HEART CATH: CATH118263

## 2022-01-06 LAB — POCT I-STAT EG7
Acid-Base Excess: 11 mmol/L — ABNORMAL HIGH (ref 0.0–2.0)
Acid-Base Excess: 11 mmol/L — ABNORMAL HIGH (ref 0.0–2.0)
Acid-Base Excess: 6 mmol/L — ABNORMAL HIGH (ref 0.0–2.0)
Bicarbonate: 31.5 mmol/L — ABNORMAL HIGH (ref 20.0–28.0)
Bicarbonate: 37 mmol/L — ABNORMAL HIGH (ref 20.0–28.0)
Bicarbonate: 37.3 mmol/L — ABNORMAL HIGH (ref 20.0–28.0)
Calcium, Ion: 0.88 mmol/L — CL (ref 1.15–1.40)
Calcium, Ion: 1.13 mmol/L — ABNORMAL LOW (ref 1.15–1.40)
Calcium, Ion: 1.14 mmol/L — ABNORMAL LOW (ref 1.15–1.40)
HCT: 32 % — ABNORMAL LOW (ref 36.0–46.0)
HCT: 36 % (ref 36.0–46.0)
HCT: 37 % (ref 36.0–46.0)
Hemoglobin: 10.9 g/dL — ABNORMAL LOW (ref 12.0–15.0)
Hemoglobin: 12.2 g/dL (ref 12.0–15.0)
Hemoglobin: 12.6 g/dL (ref 12.0–15.0)
O2 Saturation: 64 %
O2 Saturation: 67 %
O2 Saturation: 69 %
Potassium: 3.2 mmol/L — ABNORMAL LOW (ref 3.5–5.1)
Potassium: 3.9 mmol/L (ref 3.5–5.1)
Potassium: 3.9 mmol/L (ref 3.5–5.1)
Sodium: 137 mmol/L (ref 135–145)
Sodium: 138 mmol/L (ref 135–145)
Sodium: 143 mmol/L (ref 135–145)
TCO2: 33 mmol/L — ABNORMAL HIGH (ref 22–32)
TCO2: 39 mmol/L — ABNORMAL HIGH (ref 22–32)
TCO2: 39 mmol/L — ABNORMAL HIGH (ref 22–32)
pCO2, Ven: 46.9 mmHg (ref 44–60)
pCO2, Ven: 53.4 mmHg (ref 44–60)
pCO2, Ven: 53.7 mmHg (ref 44–60)
pH, Ven: 7.435 — ABNORMAL HIGH (ref 7.25–7.43)
pH, Ven: 7.446 — ABNORMAL HIGH (ref 7.25–7.43)
pH, Ven: 7.452 — ABNORMAL HIGH (ref 7.25–7.43)
pO2, Ven: 33 mmHg (ref 32–45)
pO2, Ven: 34 mmHg (ref 32–45)
pO2, Ven: 35 mmHg (ref 32–45)

## 2022-01-06 LAB — CBC
HCT: 33.9 % — ABNORMAL LOW (ref 36.0–46.0)
Hemoglobin: 10.8 g/dL — ABNORMAL LOW (ref 12.0–15.0)
MCH: 30.3 pg (ref 26.0–34.0)
MCHC: 31.9 g/dL (ref 30.0–36.0)
MCV: 95.2 fL (ref 80.0–100.0)
Platelets: 394 10*3/uL (ref 150–400)
RBC: 3.56 MIL/uL — ABNORMAL LOW (ref 3.87–5.11)
RDW: 14.6 % (ref 11.5–15.5)
WBC: 4.9 10*3/uL (ref 4.0–10.5)
nRBC: 0 % (ref 0.0–0.2)

## 2022-01-06 LAB — COOXEMETRY PANEL
Carboxyhemoglobin: 1.4 % (ref 0.5–1.5)
Methemoglobin: <0.7 % (ref 0.0–1.5)
O2 Saturation: 75.1 %
Total hemoglobin: 10.9 g/dL — ABNORMAL LOW (ref 12.0–16.0)

## 2022-01-06 LAB — RETICULOCYTES
Immature Retic Fract: 7.3 % (ref 2.3–15.9)
RBC.: 3.73 MIL/uL — ABNORMAL LOW (ref 3.87–5.11)
Retic Count, Absolute: 48.1 10*3/uL (ref 19.0–186.0)
Retic Ct Pct: 1.3 % (ref 0.4–3.1)

## 2022-01-06 LAB — BASIC METABOLIC PANEL
Anion gap: 6 (ref 5–15)
BUN: 25 mg/dL — ABNORMAL HIGH (ref 8–23)
CO2: 38 mmol/L — ABNORMAL HIGH (ref 22–32)
Calcium: 8.6 mg/dL — ABNORMAL LOW (ref 8.9–10.3)
Chloride: 94 mmol/L — ABNORMAL LOW (ref 98–111)
Creatinine, Ser: 0.91 mg/dL (ref 0.44–1.00)
GFR, Estimated: >60 mL/min (ref >60)
Glucose, Bld: 103 mg/dL — ABNORMAL HIGH (ref 70–99)
Potassium: 3.5 mmol/L (ref 3.5–5.1)
Sodium: 138 mmol/L (ref 135–145)

## 2022-01-06 LAB — IRON AND TIBC
Iron: 36 ug/dL (ref 28–170)
Saturation Ratios: 16 % (ref 10.4–31.8)
TIBC: 220 ug/dL — ABNORMAL LOW (ref 250–450)
UIBC: 184 ug/dL

## 2022-01-06 LAB — FOLATE: Folate: 13.6 ng/mL (ref >5.9)

## 2022-01-06 LAB — MAGNESIUM: Magnesium: 2.2 mg/dL (ref 1.7–2.4)

## 2022-01-06 LAB — PROTIME-INR
INR: 1.8 — ABNORMAL HIGH (ref 0.8–1.2)
Prothrombin Time: 20.8 seconds — ABNORMAL HIGH (ref 11.4–15.2)

## 2022-01-06 LAB — FERRITIN: Ferritin: 105 ng/mL (ref 11–307)

## 2022-01-06 LAB — C-REACTIVE PROTEIN: CRP: 0.8 mg/dL (ref <1.0)

## 2022-01-06 LAB — VITAMIN D 25 HYDROXY (VIT D DEFICIENCY, FRACTURES): Vit D, 25-Hydroxy: 52.1 ng/mL (ref 30–100)

## 2022-01-06 LAB — VITAMIN B12: Vitamin B-12: 435 pg/mL (ref 180–914)

## 2022-01-06 SURGERY — RIGHT HEART CATH
Anesthesia: LOCAL

## 2022-01-06 MED ORDER — SODIUM CHLORIDE 0.9 % IV SOLN: INTRAVENOUS | Status: DC

## 2022-01-06 MED ORDER — ACETAZOLAMIDE 250 MG PO TABS
250.0000 mg | ORAL_TABLET | Freq: Two times a day (BID) | ORAL | Status: DC
  Administered 2022-01-06: 250 mg via ORAL
  Filled 2022-01-06: qty 1

## 2022-01-06 MED ORDER — DULOXETINE HCL 30 MG PO CPEP
30.0000 mg | ORAL_CAPSULE | Freq: Two times a day (BID) | ORAL | Status: DC
  Administered 2022-01-06 – 2022-01-08 (×4): 30 mg via ORAL
  Filled 2022-01-06 (×6): qty 1

## 2022-01-06 MED ORDER — PROSOURCE TF PO LIQD: 45.0000 mL | Freq: Two times a day (BID) | ORAL | Status: DC

## 2022-01-06 MED ORDER — ADULT MULTIVITAMIN W/MINERALS CH
1.0000 | ORAL_TABLET | Freq: Two times a day (BID) | ORAL | Status: DC
  Administered 2022-01-06 – 2022-01-08 (×4): 1 via ORAL
  Filled 2022-01-06 (×4): qty 1

## 2022-01-06 MED ORDER — SODIUM CHLORIDE 0.9% FLUSH: 3.0000 mL | INTRAVENOUS | Status: DC | PRN

## 2022-01-06 MED ORDER — SODIUM CHLORIDE 0.9% FLUSH: 3.0000 mL | Freq: Two times a day (BID) | INTRAVENOUS | Status: DC

## 2022-01-06 MED ORDER — LIDOCAINE HCL (PF) 1 % IJ SOLN
INTRAMUSCULAR | Status: AC
  Filled 2022-01-06: qty 30

## 2022-01-06 MED ORDER — SODIUM CHLORIDE 0.9 % IV SOLN: 250.0000 mL | INTRAVENOUS | Status: DC | PRN

## 2022-01-06 MED ORDER — HEPARIN (PORCINE) IN NACL 1000-0.9 UT/500ML-% IV SOLN
INTRAVENOUS | Status: AC
  Filled 2022-01-06: qty 500

## 2022-01-06 MED ORDER — VORTIOXETINE HBR 20 MG PO TABS
20.0000 mg | ORAL_TABLET | Freq: Every day | ORAL | Status: DC
  Administered 2022-01-07 – 2022-01-08 (×2): 20 mg via ORAL
  Filled 2022-01-06 (×3): qty 1

## 2022-01-06 MED ORDER — PROSOURCE PLUS PO LIQD
30.0000 mL | Freq: Two times a day (BID) | ORAL | Status: DC
  Administered 2022-01-07 – 2022-01-08 (×4): 30 mL via ORAL
  Filled 2022-01-06 (×4): qty 30

## 2022-01-06 MED ORDER — POTASSIUM CHLORIDE 10 MEQ/50ML IV SOLN
10.0000 meq | INTRAVENOUS | Status: AC
  Administered 2022-01-06 (×4): 10 meq via INTRAVENOUS
  Filled 2022-01-06 (×4): qty 50

## 2022-01-06 MED ORDER — ONDANSETRON HCL 4 MG/2ML IJ SOLN: 4.0000 mg | Freq: Four times a day (QID) | INTRAMUSCULAR | Status: DC | PRN

## 2022-01-06 MED ORDER — ACETAMINOPHEN 325 MG PO TABS: 650.0000 mg | ORAL_TABLET | ORAL | Status: DC | PRN

## 2022-01-06 MED ORDER — SODIUM CHLORIDE 0.9% FLUSH
3.0000 mL | INTRAVENOUS | Status: DC | PRN
  Administered 2022-01-07: 3 mL via INTRAVENOUS

## 2022-01-06 MED ORDER — SODIUM CHLORIDE 0.9% FLUSH
3.0000 mL | Freq: Two times a day (BID) | INTRAVENOUS | Status: DC
  Administered 2022-01-06 – 2022-01-08 (×3): 3 mL via INTRAVENOUS

## 2022-01-06 MED ORDER — LIDOCAINE HCL (PF) 1 % IJ SOLN
INTRAMUSCULAR | Status: DC | PRN
  Administered 2022-01-06: 5 mL

## 2022-01-06 MED ORDER — BUPROPION HCL ER (XL) 150 MG PO TB24
300.0000 mg | ORAL_TABLET | Freq: Every day | ORAL | Status: DC
  Administered 2022-01-07 – 2022-01-08 (×2): 300 mg via ORAL
  Filled 2022-01-06 (×2): qty 2

## 2022-01-06 MED ORDER — HEPARIN (PORCINE) IN NACL 1000-0.9 UT/500ML-% IV SOLN
INTRAVENOUS | Status: DC | PRN
  Administered 2022-01-06: 500 mL

## 2022-01-06 MED ORDER — LABETALOL HCL 5 MG/ML IV SOLN: 10.0000 mg | INTRAVENOUS | Status: AC | PRN

## 2022-01-06 MED ORDER — WARFARIN SODIUM 3 MG PO TABS
6.0000 mg | ORAL_TABLET | Freq: Once | ORAL | Status: AC
  Administered 2022-01-06: 6 mg via ORAL
  Filled 2022-01-06: qty 2

## 2022-01-06 MED ORDER — HYDRALAZINE HCL 20 MG/ML IJ SOLN: 10.0000 mg | INTRAMUSCULAR | Status: AC | PRN

## 2022-01-06 MED ORDER — CALCIUM CARBONATE ANTACID 500 MG PO CHEW
1.0000 | CHEWABLE_TABLET | Freq: Three times a day (TID) | ORAL | Status: DC
  Administered 2022-01-06 – 2022-01-08 (×6): 200 mg via ORAL
  Filled 2022-01-06 (×6): qty 1

## 2022-01-06 SURGICAL SUPPLY — 7 items
CATH BALLN WEDGE 5F 110CM (CATHETERS) ×1 IMPLANT
CATH SWAN GANZ 7F STRAIGHT (CATHETERS) ×1 IMPLANT
KIT MICROPUNCTURE NIT STIFF (SHEATH) ×1 IMPLANT
PACK CARDIAC CATHETERIZATION (CUSTOM PROCEDURE TRAY) ×2 IMPLANT
SHEATH PINNACLE 7F 10CM (SHEATH) ×1 IMPLANT
SHEATH PROBE COVER 6X72 (BAG) ×1 IMPLANT
TRANSDUCER W/STOPCOCK (MISCELLANEOUS) ×2 IMPLANT

## 2022-01-06 NOTE — Care Plan (Signed)
Hospitalists Transfer Accept Note ? ?Deborah Cooke is a 71 y.o. 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  ? ?In the ER, BNP elevated, admitted for IV Lasix.   ? ?Overnight on second hospital day, developed hypotension, so was transferred to ICU.  Suspect this was diuresis plus medications with vasodilatory and anti-inotropic effects including opiates, seroquel and gabapentin.   ? ?Weaned off pressors.  AHF following, underwent R/LHC on 4/19. ?

## 2022-01-06 NOTE — Progress Notes (Signed)
ANTICOAGULATION CONSULT NOTE - Follow Up Consult ? ?Pharmacy Consult for Warfarin ?Indication: pulmonary embolus - recent 3/23 ? ?Allergies  ?Allergen Reactions  ? Nsaids Nausea And Vomiting and Other (See Comments)  ?  GI Upset (ibuprofen included)  ? Ibuprofen Other (See Comments)  ?  Other reaction(s): Abdominal Pain  ? Tolmetin Other (See Comments)  ?  Other reaction(s): Abdominal Pain ?  ? ? ?Patient Measurements: ?Weight: 99.5 kg (219 lb 5.7 oz) ? ? ?Vital Signs: ?Temp: 99.3 ?F (37.4 ?C) (04/19 0700) ?Temp Source: Oral (04/19 0700) ?BP: 113/70 (04/19 0800) ?Pulse Rate: 100 (04/19 0800) ? ?Labs: ?Recent Labs  ?  01/04/22 ?0451 01/05/22 ?4656 01/06/22 ?0423  ?HGB 9.9* 11.0* 10.8*  ?HCT 30.2* 34.4* 33.9*  ?PLT 346 388 394  ?LABPROT 22.5* 22.9* 20.8*  ?INR 2.0* 2.0* 1.8*  ?CREATININE 0.93 0.90 0.91  ? ? ? ?Estimated Creatinine Clearance: 57.1 mL/min (by C-G formula based on SCr of 0.91 mg/dL). ? ? ?Assessment: ?71 year old female admitted with dyspnea to continue warfarin for PE diagnosed in March.   ?INR on admission 1.9.  ? ?INR dropped slightly to 1.8 today after reducing dose yesterday. Will give slightly higher dose tonight. Hgb 11, plts stable. No s/x of bleeding noted. ? ?Dose PTA - > 4.5 mg Tuesday and Thursday, 3 mg MWFSS ? ?Goal of Therapy:  ?INR 2-3 ?Monitor platelets by anticoagulation protocol: Yes ?  ?Plan:  ?Planning RHC today 4/19, will give Warfarin 6 mg po x 1 dose today ?Daily INR and CBC ? ?Erin Hearing PharmD., BCPS ?Clinical Pharmacist ?01/06/2022 8:22 AM ? ?

## 2022-01-06 NOTE — Progress Notes (Signed)
Initial Nutrition Assessment ? ?DOCUMENTATION CODES:  ? ?Non-severe (moderate) malnutrition in context of acute illness/injury ? ?INTERVENTION:  ? ?Liberalize diet to Heart Healthy; Creatinine is wdl with no hx of renal disease and no electrolyte abnormalcies and no hx of DM, CBGs not being checked, serum glucose ok, not even on insulin.  ? ?Add 30 ml ProSource Plus BID, each supplement provides 100 kcals and 15 grams protein.  ? ?Add MVI with MInerals BID (substitute for Bariatric MVI as no Bariatric MVI available on formulary) ? ?Add Calcium Carbonate 500 mg TID ? ?Check Micronutrients of Concern in setting of Bariatric Surgery with possible noncompliance with vitamin/mineral regimen: Iron, B12, Folate, Vit A, Vit D, Copper, Zinc ? ?NUTRITION DIAGNOSIS:  ? ?Moderate Malnutrition related to acute illness (CABG x 4, post op complications, frequent readmissions, hx of gastric bypass) as evidenced by mild muscle depletion, mild fat depletion. ? ? ?GOAL:  ? ?Patient will meet greater than or equal to 90% of their needs ? ? ?MONITOR:  ? ?PO intake, Supplement acceptance ? ?REASON FOR ASSESSMENT:  ? ?Consult, Rounds ?Diet education ? ?ASSESSMENT:  ? ?71 yo female admtited with hypotension and RV failure with acute on chronic cor pulmonale. PMH includes recent CABG x 4, hx of Gastric Bypass, hx of breast cancer, GERD, HTN, chronic opiate use ? ?Pt alert on visit today, getting ready to eat lunch. Son at bedside.  ? ?Pt remains on lasix gtt ?Despite significant edema on exam, 4+, pt with weight loss per weight encounters.  Current wt 99.5 kg, true dry weight still unknown. Admit wt in Feb prior to emergent CABG was 109 kg; noted wt of 106 earlier this month.  ? ?Pt reports despite being in and out of the hospital and rehab since Feb, pt reports she has been eating well. Pt has not been taking any nutrition supplements.  ? ?Pt reports she takes a standard, not bariatric, MVI at home. Reports she takes 1200 mg Calcium daily  and also takes iron. However, Son at bedside and he has been giving pt her meds for the last several weeks and reports the only vitamin/mineral he has given pt is Iron.  ? ?No recent labs including no recent anemia panel, last vit D 09/2020, no other labs checked. Recommend checking micronutrients of concern for pt's with bariatric surgery especially given concern that pt may not have been taking her  ? ?Pt with falls and weakness. Prior to her emergent CABG (10/22/21), pt was very mobile, driving car, etc. Pt with significant function decline since then.  ? ?On visit today, son helping pt eat her lunch. Son reports pt has been eating well because he has been assisting pt with meals. Son lives 3 hours away and will be returning home today; inquired if pt needs assistance at meals or can she feed herself. Son and pt both indicate she can feed herself, she just might spill some things as she is just weak. Encouraged pt to feed self as this will help improve UE strenghth; towel can be placed over chest/abdomen for any messes.  ? ?Son also reports pt having significant twitching prior to and on admission; so significant that pt "looked like she was doing kung fu" and was dropping things including her phone "like 20 times a day." ? ?Pt reports no taste changes, no changes in vision. No tingling in LE.  ? ?Pt is edentulous; wears dentures but does not always put them in. Pt reports she can eat fairly  well without the dentures in place.  ? ?Labs: Creatinine wdl, glucose 103, no CBGs-not on sliding scale insulin, HgbA1c 5.3 ?Meds: reviewed ? ?NUTRITION - FOCUSED PHYSICAL EXAM: ? ?Flowsheet Row Most Recent Value  ?Orbital Region Mild depletion  ?Upper Arm Region No depletion  ?Thoracic and Lumbar Region No depletion  ?Buccal Region Mild depletion  ?Temple Region Mild depletion  ?Clavicle Bone Region Mild depletion  ?Clavicle and Acromion Bone Region Mild depletion  ?Scapular Bone Region Mild depletion  ?Dorsal Hand Mild  depletion  ?Patellar Region Unable to assess  ?Anterior Thigh Region Unable to assess  ?Posterior Calf Region Unable to assess  ?Edema (RD Assessment) Severe  ? ?  ? ? ?Diet Order:  RENAL/CARB MODIFIED with FLUID restriction ? ? ?EDUCATION NEEDS:  ? ?Education needs have been addressed ? ?Skin:  Skin Assessment: Reviewed RN Assessment ? ?Last BM:  4/18 ? ?Height:  ? ?Ht Readings from Last 1 Encounters:  ?12/30/21 '4\' 11"'$  (1.499 m)  ? ? ?Weight:  ? ?Wt Readings from Last 1 Encounters:  ?01/06/22 99.5 kg  ? ? ? ?BMI:  Body mass index is 44.3 kg/m?. ? ?Estimated Nutritional Needs:  ? ?Kcal:  1700-1900 kcals ? ?Protein:  100-115g ? ?Fluid:  >/= 1.5 L ? ? ?Kerman Passey MS, RDN, LDN, CNSC ?Registered Dietitian III ?Clinical Nutrition ?RD Pager and On-Call Pager Number Located in [place]  ? ?

## 2022-01-06 NOTE — Interval H&P Note (Signed)
History and Physical Interval Note: ? ?01/06/2022 ?12:00 PM ? ?Deborah Cooke  has presented today for surgery, with the diagnosis of heart failure.  The various methods of treatment have been discussed with the patient and family. After consideration of risks, benefits and other options for treatment, the patient has consented to  Procedure(s): ?RIGHT HEART CATH (N/A) as a surgical intervention.  The patient's history has been reviewed, patient examined, no change in status, stable for surgery.  I have reviewed the patient's chart and labs.  Questions were answered to the patient's satisfaction.   ? ? ?[name] ? ? ?

## 2022-01-06 NOTE — Progress Notes (Addendum)
? ?NAME:  Deborah Cooke, MRN:  [id number], DOB:  14-Feb-1951, LOS: 4 ?ADMISSION DATE:  01/02/2022, CONSULTATION DATE: January 03, 2022 ?REFERRING MD: Hospitalist service, CHIEF COMPLAINT: Low blood pressure ? ?History of Present Illness:  ?Patient 71 year old female with past medical history of coronary artery disease CABG PE on Coumadin hypothyroidism who presented with severe volume anasarca volume overload dyspnea on exertion she was given a dose of Lasix she was diuresing very well in the ED later on in the course patient received all of her medications including gabapentin 300 mg of Seroquel oxycodone later on in the course she became hypotensive she was given some fluids back with no improvement she was put on pressors for systolic blood pressure of 60.  Patient had no fever chills rigors no nausea vomiting diarrhea.  Shortness of breath x10 days.  Had a STEMI with cardiogenic shock requiring IABP, severe 3-vessel disease > CABG x4 in February 2023. She had IABP removed POD4, inotropes weaned subsequently. She was diagnosed with PE in early March and acute heart failure in March as well.  Had issues with low BPs and difficulty tolerating diuresis-- only tolerating metoprolol succ 12.48m daily. 15 pound weight gain in the last week. Did poorly after discharge from SNF rehab post-hospitalization.  Her son reports that she has recent memory impairment for the past few months and he is concern for dementia. ? ?On 4/12 she had a fall at home due to tripping when walking with her walker.  She fell and hit her head on concrete.  She is chronically on Coumadin.  In the ED that day and discharged home.  Mobility has worsened since her fall. ? ?119 kg at admission, was 106kg on 3/28. ? ?PMH:  ?Breast cancer in 2018 ?Chronic cor pulmonale ?PE March 2023- on coumadin ?GERD ?Hypertension ?Hypothyroidism ?Osteoporosis ?Chronic opiate use ? ?Hospital course:  ?4/15 admitted, upgraded to ICU overnight ?4/17 Diuresing well, awake  and on Colerain, off Levophed,  ?4/18 8L UOP yesterday, lasix gtt titrated down + diamox, remains off pressors ? ?Interim history:  ?Still has occipital chronic HA ?Denies any CP/ SOB ?Going for RHC today ~11 ?Diuresed 5.3L on lasix gtt/ diamox yest, overall net -12.9, sCr stable ?Wts 103> 99.5 kg ?CVP 8 ?Coox 76> 75% ?afebrile ? ?Objective   ?Blood pressure 113/70, pulse 100, temperature 99.3 ?F (37.4 ?C), temperature source Oral, resp. rate 17, weight 99.5 kg, SpO2 94 %. ?CVP:  [6 mmHg-12 mmHg] 8 mmHg  ?   ? ?Intake/Output Summary (Last 24 hours) at 01/06/2022 0848 ?Last data filed at 01/06/2022 0800 ?Gross per 24 hour  ?Intake 693.07 ml  ?Output 3800 ml  ?Net -3106.93 ml  ? ?Filed Weights  ? 01/03/22 0500 01/05/22 0500 01/06/22 0500  ?Weight: 119.1 kg 103.5 kg 99.5 kg  ? ?General:  chronically ill, morbidly obese, elderly female lying in bed in NAD ?HEENT: MM pink/moist ?Neuro: Alert, oriented x 3, MAE ?CV: rr, borderline ST, no murmur ?PULM:  non labored, few insp crackles clears with good cough, diminished in bases ?GI:  +bs, soft, NT, last BM yest, purwick ?Extremities: warm/dry, ulna boots in place, LE edema ongoing  ?Skin: no rashes ? ?Labs reveiwed> K 3.5, bicarb 39> 38, sCr 0.91, mag 2.2, Hgb 10.8, INR 2> 1.8, TSH 9.721, FT4 0.74 ? ?Assessment & Plan:  ? 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  Continue holding all antihypertensives  ?- RHC today  ?- stable coox, trend ?- trending CVP, 8 today ?- continue lasix gtt till RHC> ? Switching off drip to IVP today, pending RHC findings ?- needs outpt  referral for weight loss program ?- cont ulna boots  ? ?Acute hypoxic and hypercapnic respiratory failure, likely chronic hypercapnia due to OHS, OSA, possibly also acute pulmonary edema. ?Recent PE (11/2021) ?- continue supplemental oxygen to maintain SPO2 greater than 88%. ?- has not tolerated NIV here ?- outpt sleep study and weight loss as above ?- coumadin per pharmacy, still subtherapeutic.  No DOAC with carbamazepine ?- diuresis as above ? ?CAD,  ?s/p CABG in Feb 2023 ?- Needs Plavix indefinitely due to cryo graft ?- continue Aspirin, statin ?- continue Holding metoprolol due to hypotension; has generally been intolerant to beta-blockers from hypotension at home, likely related to chronic cor pulmonale ? ?Acute encephalopathy; some concern for dementia with recent memory loss over the last few months at home ?Improving  ?- Ammonia level ok ?- TSH 9.7> continue thyroid meds and will need recheck in 6 weeks  ?- Avoid sedating medications as able, requesting pain medications ?- SW following, pt's spouse is unable to care for pt with current needs, working on SNF placement ? ?AKI, resolved ?Anasarca  ?likely cardiorenal ?- improved with diuresis ?- continue lasix gtt per HF ?- strict I/Os, daily wts  ?- Trend BMP / urinary output ?- Replace electrolytes as indicated ?- Avoid nephrotoxic agents, ensure adequate renal perfusion  ? ?Anemia due to chronic disease ?- Hgb stable, trend ?- transfuse for Hgb < 7 ? ?Deconditioning ?Falls/ weakness ?- ongoing aggressive PT, OT ?- SW seeking SNF placement ? ?Hypothyroidism ?- cont levothyroxine and liothyronine ?- recheck TSH in 6 weeks  ?  ?Chronic pain disorder ?- have been holding flexeril, percocet, gabapentin (consider restarting lower home dose 4/20) ?- tylenol prn (doing well with this so far) ?  ?Mood disorder ?On multiple medications for management of bipolar 2 disorder and GAD. ?-  meds have been on hold given hypotension ?- continue carbamazepine ?- restart home  bupropion, duloxetine, Trintellix ?- consider restarting lower dose seroquel qhs  ?- holding home hydroxyzine for now, avoid oversedation  ? ?Morbid Obesity ?- BMI 44 ?- previous gastric bypass ?- need outpt weight loss program ? ?Labs   ?CBC: ?Recent Labs  ?Lab 12/30/21 ?1640 01/02/22 ?1813 01/03/22 ?0430 01/04/22 ?0451 01/05/22 ?0518804/19/23 ?0423  ?WBC 6.6 6.6 5.9 5.3 5.5 4.9  ?NEUTROABS 4.3  --   --   --   --   --   ?HGB 11.3* 11.0* 9.1* 9.9* 11.0* 10.8*  ?HCT 36.0 34.6* 28.8* 30.2* 34.4* 33.9*  ?MCV 97.0 96.1 95.0 94.4 94.5 95.2  ?PLT 310 372 310 346 388 394  ? ? ?Basic Metabolic Panel: ?Recent Labs  ?Lab 01/02/22 ?1813 01/03/22 ?0430 01/04/22 ?0451 01/05/22 ?0416604/19/23 ?0423  ?NA 135 135 138 139 138  ?K 4.8 5.0 3.4* 3.6 3.5  ?CL 101 102 97* 90* 94*  ?CO2 24 27 34* 39* 38*  ?GLUCOSE 110* 83 141* 107* 103*  ?BUN 33* 32* 24* 21 25*  ?CREATININE 1.20* 1.13* 0.93 0.90 0.91  ?  CALCIUM 8.8* 8.0* 8.2* 8.2* 8.6*  ?MG  --  1.8 1.8 2.1 2.2  ? ?GFR: ?Estimated Creatinine Clearance: 57.1 mL/min (by C-G formula based on SCr of 0.91 mg/dL). ?Recent Labs  ?Lab 01/03/22 ?0430 01/03/22 ?0745 01/04/22 ?0451 01/05/22 ?6160 01/06/22 ?0423  ?WBC 5.9  --  5.3 5.5 4.9  ?LATICACIDVEN 1.3 1.9  --   --   --   ? ? ?Liver Function Tests: ?Recent Labs  ?Lab 01/02/22 ?2140  ?AST 33  ?ALT 14  ?ALKPHOS 121  ?BILITOT 1.0  ?PROT 5.4*  ?ALBUMIN 2.8*  ? ?No results for input(s): LIPASE, AMYLASE in the last 168 hours. ?Recent Labs  ?Lab 01/03/22 ?1104  ?AMMONIA 29  ? ? ?ABG ?   ?Component Value Date/Time  ? PHART 7.44 11/26/2021 2342  ? PCO2ART 47 11/26/2021 2342  ? PO2ART 91 11/26/2021 2342  ? HCO3 32.0 (H) 11/26/2021 2342  ? TCO2 32 11/02/2021 0347  ? ACIDBASEDEF 1.0 10/28/2021 0036  ? O2SAT 75.1 01/06/2022 0423  ? ?  ? ?Coagulation Profile: ?Recent Labs  ?Lab 01/02/22 ?1813 01/03/22 ?1047 01/04/22 ?0451 01/05/22 ?0427 01/06/22 ?0423  ?INR 1.9* 1.9* 2.0* 2.0* 1.8*  ? ?Best Practice (right click and "Reselect all SmartList Selections" daily)   ? ?Diet/type: Regular consistency (see orders)- currently NPO for RHC today  ?DVT prophylaxis: other, coumadin ?GI prophylaxis: PPI  ?Lines: yes and it is still needed- PICC ?Foley:  N/A  ?Code Status:  full c

## 2022-01-06 NOTE — Progress Notes (Signed)
Physical Therapy Treatment ?Patient Details ?Name: Deborah Cooke ?MRN: [id number] ?DOB: 1951-02-24 ?Today's Date: 01/06/2022 ? ? ?History of Present Illness 71 y.o. F who presents with severe volume anasarca volume overload, dyspnea on exertion, hypotension. 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  To undergo RHC 01/06/22. ? ?  ?PT Comments  ? ? Patient progressing with ambulation this session using EVA walker able to walk to door and back to bed.  Deferred OOB as planned for RHC this am.  Patient fatigued, but still able to help boost herself up back in bed with assist for legs.  She was light headed, but noted mild drop in SpO2 which could explain symptoms.  Did not demonstrate issues with positional testing today, but if continues to complain of symptoms of "dizziness" will follow up with complete vestibular evaluation.  Continue to recommend STSNF level rehab at d/c.  ?   ?Recommendations for follow up therapy are one component of a multi-disciplinary discharge planning process, led by the attending physician.  Recommendations may be updated based on patient status, additional functional criteria and insurance authorization. ? ?Follow Up Recommendations ? Skilled nursing-short term rehab (<3 hours/day) ?  ?  ?Assistance Recommended at Discharge Frequent or constant Supervision/Assistance  ?Patient can return home with the following A little help with walking and/or transfers;A little help with bathing/dressing/bathroom;Assistance with cooking/housework;Assist for transportation;Help with stairs or ramp for entrance ?  ?Equipment Recommendations ? None recommended by PT  ?  ?Recommendations for Other Services   ? ? ?  ?Precautions / Restrictions Precautions ?Precautions: Fall  ?  ? ?Mobility ? Bed Mobility ?Overal bed mobility: Needs Assistance ?Bed Mobility: Sit to Supine, Rolling ?Rolling: Mod assist ?  ?Supine to sit: Mod assist,  HOB elevated ?Sit to supine: Mod assist ?  ?General bed mobility comments: assist to lift trunk and to scoot to EOB; assist for legs into bed, +2 to scoot to [facility] ?  ? ?Transfers ?Overall transfer level: Needs assistance ?Equipment used: Ambulation equipment used ?Transfers: Sit to/from Stand ?Sit to Stand: Min assist ?  ?  ?  ?  ?  ?General transfer comment: assist for balance up to eval walker ?  ? ?Ambulation/Gait ?Ambulation/Gait assistance: Min assist ?Gait Distance (Feet): 12 Feet ?Assistive device: Ethelene Hal ?Gait Pattern/deviations: Step-through pattern, Decreased stride length, Shuffle, Wide base of support, Trunk flexed ?  ?  ?  ?General Gait Details: heavy UE support on EVA walker able to ambulate to door and back to bed with increased time, assist for safety and balance ? ? ?Stairs ?  ?  ?  ?  ?  ? ? ?Wheelchair Mobility ?  ? ?Modified Rankin (Stroke Patients Only) ?  ? ? ?  ?Balance Overall balance assessment: Needs assistance ?Sitting-balance support: Feet unsupported ?Sitting balance-Leahy Scale: Fair ?  ?  ?Standing balance support: Bilateral upper extremity supported ?Standing balance-Leahy Scale: Poor ?Standing balance comment: reliant on RW ?  ?  ?  ?  ?  ?  ?  ?  ?  ?  ?  ?  ? ?  ?Cognition Arousal/Alertness: Awake/alert ?Behavior During Therapy: Flat affect ?Overall Cognitive Status: Within Functional Limits for tasks assessed ?  ?  ?  ?  ?  ?  ?  ?  ?  ?  ?  ?  ?  ?  ?  ?  ?  ?  ?  ? ?  ?  Exercises   ? ?  ?General Comments General comments (skin integrity, edema, etc.): VSS throughout; maintained on 3L O2; son in the room initially ? ? Vestibular Assessment - 01/06/22 0001   ? ?  ? Symptom Behavior  ? Subjective history of current problem reports light headed symptoms more so when turning to sides in bed; denies recent URI or "stuffiness"   ? Type of Dizziness  Lightheadedness   ? Frequency of Dizziness intermittent   ? Duration of Dizziness seconds   ? Symptom Nature Motion  provoked;Intermittent   ? Aggravating Factors Rolling to right;Rolling to left   ? Relieving Factors Head stationary;Closing eyes;Rest   ? Progression of Symptoms Better   ? History of similar episodes None   ?  ? Positional Testing  ? Horizontal Canal Testing Horizontal Canal Right;Horizontal Canal Left   ?  ? Horizontal Canal Right  ? Horizontal Canal Right Duration 45 sec   ? Horizontal Canal Right Symptoms Normal   ?  ? Horizontal Canal Left  ? Horizontal Canal Left Duration 45 sec   ? Horizontal Canal Left Symptoms Normal   ? ?  ?  ? ?  ?  ?  ?  ? ?Pertinent Vitals/Pain Pain Assessment ?Pain Assessment: 0-10 ?Pain Score: 7  ?Pain Location: headache posterior ?Pain Descriptors / Indicators: Headache ?Pain Intervention(s): Monitored during session, RN gave pain meds during session, Patient requesting pain meds-RN notified  ? ? ?Home Living   ?  ?  ?  ?  ?  ?  ?  ?  ?  ?   ?  ?Prior Function    ?  ?  ?   ? ?PT Goals (current goals can now be found in the care plan section) Progress towards PT goals: Progressing toward goals ? ?  ?Frequency ? ? ? Min 3X/week ? ? ? ?  ?PT Plan Current plan remains appropriate  ? ? ?Co-evaluation   ?  ?  ?  ?  ? ?  ?AM-PAC PT "6 Clicks" Mobility   ?Outcome Measure ? Help needed turning from your back to your side while in a flat bed without using bedrails?: A Lot ?Help needed moving from lying on your back to sitting on the side of a flat bed without using bedrails?: A Lot ?Help needed moving to and from a bed to a chair (including a wheelchair)?: A Little ?Help needed standing up from a chair using your arms (e.g., wheelchair or bedside chair)?: A Little ?Help needed to walk in hospital room?: Total ?Help needed climbing 3-5 steps with a railing? : Total ?6 Click Score: 12 ? ?  ?End of Session Equipment Utilized During Treatment: Gait belt;Oxygen ?Activity Tolerance: Patient limited by fatigue ?Patient left: in bed;with call bell/phone within reach;with nursing/sitter in room ?  ?PT  Visit Diagnosis: Unsteadiness on feet (R26.81);Muscle weakness (generalized) (M62.81);Difficulty in walking, not elsewhere classified (R26.2) ?  ? ? ?Time: 1005-1040 ?PT Time Calculation (min) (ACUTE ONLY): 35 min ? ?Charges:  $Gait Training: 8-22 mins ?$Therapeutic Activity: 8-22 mins          ?          ? ?Magda Kiel, PT ?Acute Rehabilitation Services ?JEHUD:[phone] ?[phone] ?01/06/2022 ? ? ? ?Deborah Cooke ?01/06/2022, 2:04 PM ? ?

## 2022-01-06 NOTE — Progress Notes (Addendum)
? ? Advanced Heart Failure Rounding Note ? ?PCP-Cardiologist: Jayadeep Varanasi, MD  ? ?Subjective:   ? ?Remains off NE. CO-OX 75%. ? ?Cuff MAPs stable in 80s ? ?CVP 8. Brisk diuresis on lasix gtt at 7.5/hr + diamox 250 BID. ? ?Weight down 9 lb overnight. ? ?Scr stable at 0.91, CO2 38, K 3.5 ? ?Denies dyspnea. Remains weak but strength slowly improving. ? ?Objective:   ?Weight Range: ?99.5 kg ?Body mass index is 44.3 kg/m?.  ? ?Vital Signs:   ?Temp:  [98.1 ?F (36.7 ?C)-99.3 ?F (37.4 ?C)] 99.3 ?F (37.4 ?C) (04/19 0700) ?Pulse Rate:  [100-112] 100 (04/19 0800) ?Resp:  [9-23] 17 (04/19 0800) ?BP: (93-120)/(62-87) 113/70 (04/19 0800) ?SpO2:  [90 %-100 %] 94 % (04/19 0800) ?Weight:  [99.5 kg] 99.5 kg (04/19 0500) ?Last BM Date : 01/05/22 ? ?Weight change: ?Filed Weights  ? 01/03/22 0500 01/05/22 0500 01/06/22 0500  ?Weight: 119.1 kg 103.5 kg 99.5 kg  ? ? ?Intake/Output:  ? ?Intake/Output Summary (Last 24 hours) at 01/06/2022 0825 ?Last data filed at 01/06/2022 0800 ?Gross per 24 hour  ?Intake 693.07 ml  ?Output 3800 ml  ?Net -3106.93 ml  ?  ? ? ?Physical Exam  ?  ?General:  No distress. Lying in bed. ?HEENT: normal ?Neck: supple. JVP difficult d/t body habitus. Carotids 2+ bilat; no bruits.  ?Cor: PMI nondisplaced. Regular rate & rhythm. No rubs, gallops, 2/6 TR murmur ?Lungs: clear ?Abdomen: obese, soft, nontender, nondistended.  ?Extremities: no cyanosis, clubbing, rash, 2+ edema, + UNNA boots ?Neuro: alert & orientedx3, cranial nerves grossly intact. moves all 4 extremities w/o difficulty. Affect pleasant ? ? ? ?Telemetry  ? ?SR/sinus tach 90s-100s, < 5 PVCs/min ? ? ?Labs  ?  ?CBC ?Recent Labs  ?  01/05/22 ?0427 01/06/22 ?0423  ?WBC 5.5 4.9  ?HGB 11.0* 10.8*  ?HCT 34.4* 33.9*  ?MCV 94.5 95.2  ?PLT 388 394  ? ?Basic Metabolic Panel ?Recent Labs  ?  01/05/22 ?0427 01/06/22 ?0423  ?NA 139 138  ?K 3.6 3.5  ?CL 90* 94*  ?CO2 39* 38*  ?GLUCOSE 107* 103*  ?BUN 21 25*  ?CREATININE 0.90 0.91  ?CALCIUM 8.2* 8.6*  ?MG 2.1 2.2   ? ?Liver Function Tests ?No results for input(s): AST, ALT, ALKPHOS, BILITOT, PROT, ALBUMIN in the last 72 hours. ?No results for input(s): LIPASE, AMYLASE in the last 72 hours. ?Cardiac Enzymes ?No results for input(s): CKTOTAL, CKMB, CKMBINDEX, TROPONINI in the last 72 hours. ? ?BNP: ?BNP (last 3 results) ?Recent Labs  ?  11/26/21 ?1119 01/02/22 ?1813  ?BNP 482.4* 405.8*  ? ? ?ProBNP (last 3 results) ?No results for input(s): PROBNP in the last 8760 hours. ? ? ?D-Dimer ?No results for input(s): DDIMER in the last 72 hours. ?Hemoglobin A1C ?No results for input(s): HGBA1C in the last 72 hours. ?Fasting Lipid Panel ?No results for input(s): CHOL, HDL, LDLCALC, TRIG, CHOLHDL, LDLDIRECT in the last 72 hours. ?Thyroid Function Tests ?Recent Labs  ?  01/05/22 ?1435  ?TSH 9.721*  ? ? ?Other results: ? ? ?Imaging  ? ? ?No results found. ? ? ?Medications:   ? ? ?Scheduled Medications: ? carbamazepine  400 mg Oral BID  ? Chlorhexidine Gluconate Cloth  6 each Topical Daily  ? clopidogrel  75 mg Oral Daily  ? levothyroxine  88 mcg Oral Q0600  ? liothyronine  25 mcg Oral Q0600  ? mouth rinse  15 mL Mouth Rinse BID  ? midodrine  5 mg Oral TID WC  ?   pantoprazole  40 mg Oral Daily  ? rosuvastatin  10 mg Oral Daily  ? sodium chloride flush  10-40 mL Intracatheter Q12H  ? sodium chloride flush  3 mL Intravenous Q12H  ? sodium chloride flush  3 mL Intravenous Q12H  ? Warfarin - Pharmacist Dosing Inpatient   Does not apply q1600  ? ? ?Infusions: ? sodium chloride    ? sodium chloride    ? sodium chloride    ? furosemide (LASIX) 200 mg in dextrose 5% 100 mL (2mg/mL) infusion 7.5 mg/hr (01/06/22 0815)  ? norepinephrine (LEVOPHED) Adult infusion Stopped (01/04/22 0849)  ? potassium chloride    ? ? ?PRN Medications: ?sodium chloride, acetaminophen **OR** acetaminophen, ondansetron **OR** ondansetron (ZOFRAN) IV, oxyCODONE-acetaminophen, senna-docusate, sodium chloride flush, sodium chloride flush ? ? ? ?Assessment/Plan  ? ?1.  Hypotension due to a/c cor pulmonale- Resolved  ?- NE off. MAPs stable in 80s. Continues on midodrine 5 TID ?- Dizziness with positional changes but orthostatics were negative. ?- Continue to hold metoprolol  ?  ?2. 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 RV okay, moderate to severe TR, mild AS with mean gradient of 10 mmHg, severe MAC with mild mitral stenosis mean gradient 5 mmHg ?- CO-OX stable off NE. ?- Continues to diurese well with lasix gtt at 7.5/hr + diamox. Continue current diuretics for now, looks volume up on exam. CVP 8. Will decide on further diuresis pending RHC later this am. ?- CO2 34>39>38 ?- Supp K (3.5), Mag 2.2 ?- RHC this afternoon ?- Continue UNNA boots  ?- Will need cpap and weight loss ?- GDMT limited by soft BP and positional dizziness  ? ?3. Valvular heart disease, moderate to severe TR ?- Echo this admit with EF 65-70%, moderate to severe TR, mild AS with mean gradient 10 mmHg and mild mitral stenosis mean gradient 5 mmHg.  ?  ?4. 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  ?5. Acute hypoxic/hypercapnic respiratory failure, likely due to OHS, OSA  ?- plan per CCM ?  ?6. Morbid obesity ?- s/p gastric bypass ?- will need weight loss ?- consider GLP1RA ?  ?7. Falls/weakness ?- PT/OT  ?- not safe to live at home currently. PT recommending SNF ?  ?8. PE ?- Discovered 11/2021  ?- continue warfarin ?- not on DOAC with use of carbamzepine ?- INR 1.8 ? ?9. Anemia ?- Hemoglobin stable. ?- Monitor ? ? ?Disposition: ?Continue aggressive PT/OT. PT recommended SNF for rehab at discharge. ? ? ?Length of Stay: 4 ? ?FINCH, LINDSAY N, PA-C  ?01/06/2022, 8:25 AM ? ?Advanced Heart Failure Team ?Pager [phone] (M-F; 7a -  5p)  ?Please contact [facility] for night-coverage after hours (5p -7a ) and weekends on [email or web address] ? ?Patient seen and examined with the above-signed Advanced Practice Provider and/or Housestaff. I personally reviewed laboratory data, imaging studies and relevant notes. I independently examined the patient and formulated the important aspects of the plan. I have edited the note to reflect any of my changes or salient points. I have personally discussed the plan with the patient and/or family. ? ?Continues to diurese well. Breathing better. CVP 8. BP stable on midodrine.  ? ?General:  Obese   woamn No resp difficulty ?HEENT: normal ?Neck: supple hard to see jvp. Carotids 2+ bilat; no bruits. No lymphadenopathy or thryomegaly appreciated. ?Cor: PMI nondisplaced. Regular rate & rhythm. 2/6 TR ?Lungs: clear ?Abdomen: obese soft, nontender, nondistended. No hepatosplenomegaly. No bruits or masses. Good bowel sounds. ?Extremities: no cyanosis, clubbing, rash, tr-1+ edema + UNNA ?Neuro: alert & orientedx3, cranial nerves grossly intact. moves all 4 extremities w/o difficulty. Affect pleasant ? ?Continues to diurese well. Will plan RHC today. Needs aggressive PT. Continue midodrine for BP support.  ? ?Glori Bickers, MD  ?11:35 AM ? ?  ? ? ?

## 2022-01-06 NOTE — H&P (View-Only) (Signed)
? ? Advanced Heart Failure Rounding Note ? ?PCP-Cardiologist: Larae Grooms, MD  ? ?Subjective:   ? ?Remains off NE. CO-OX 75%. ? ?Cuff MAPs stable in 80s ? ?CVP 8. Brisk diuresis on lasix gtt at 7.5/hr + diamox 250 BID. ? ?Weight down 9 lb overnight. ? ?Scr stable at 0.91, CO2 38, K 3.5 ? ?Denies dyspnea. Remains weak but strength slowly improving. ? ?Objective:   ?Weight Range: ?99.5 kg ?Body mass index is 44.3 kg/m?.  ? ?Vital Signs:   ?Temp:  [98.1 ?F (36.7 ?C)-99.3 ?F (37.4 ?C)] 99.3 ?F (37.4 ?C) (04/19 0700) ?Pulse Rate:  [100-112] 100 (04/19 0800) ?Resp:  [9-23] 17 (04/19 0800) ?BP: (93-120)/(62-87) 113/70 (04/19 0800) ?SpO2:  [90 %-100 %] 94 % (04/19 0800) ?Weight:  [99.5 kg] 99.5 kg (04/19 0500) ?Last BM Date : 01/05/22 ? ?Weight change: ?Filed Weights  ? 01/03/22 0500 01/05/22 0500 01/06/22 0500  ?Weight: 119.1 kg 103.5 kg 99.5 kg  ? ? ?Intake/Output:  ? ?Intake/Output Summary (Last 24 hours) at 01/06/2022 0825 ?Last data filed at 01/06/2022 0800 ?Gross per 24 hour  ?Intake 693.07 ml  ?Output 3800 ml  ?Net -3106.93 ml  ?  ? ? ?Physical Exam  ?  ?General:  No distress. Lying in bed. ?HEENT: normal ?Neck: supple. JVP difficult d/t body habitus. Carotids 2+ bilat; no bruits.  ?Cor: PMI nondisplaced. Regular rate & rhythm. No rubs, gallops, 2/6 TR murmur ?Lungs: clear ?Abdomen: obese, soft, nontender, nondistended.  ?Extremities: no cyanosis, clubbing, rash, 2+ edema, + UNNA boots ?Neuro: alert & orientedx3, cranial nerves grossly intact. moves all 4 extremities w/o difficulty. Affect pleasant ? ? ? ?Telemetry  ? ?SR/sinus tach 90s-100s, < 5 PVCs/min ? ? ?Labs  ?  ?CBC ?Recent Labs  ?  01/05/22 ?0427 01/06/22 ?0423  ?WBC 5.5 4.9  ?HGB 11.0* 10.8*  ?HCT 34.4* 33.9*  ?MCV 94.5 95.2  ?PLT 388 394  ? ?Basic Metabolic Panel ?Recent Labs  ?  01/05/22 ?0427 01/06/22 ?0423  ?NA 139 138  ?K 3.6 3.5  ?CL 90* 94*  ?CO2 39* 38*  ?GLUCOSE 107* 103*  ?BUN 21 25*  ?CREATININE 0.90 0.91  ?CALCIUM 8.2* 8.6*  ?MG 2.1 2.2   ? ?Liver Function Tests ?No results for input(s): AST, ALT, ALKPHOS, BILITOT, PROT, ALBUMIN in the last 72 hours. ?No results for input(s): LIPASE, AMYLASE in the last 72 hours. ?Cardiac Enzymes ?No results for input(s): CKTOTAL, CKMB, CKMBINDEX, TROPONINI in the last 72 hours. ? ?BNP: ?BNP (last 3 results) ?Recent Labs  ?  11/26/21 ?1119 01/02/22 ?1813  ?BNP 482.4* 405.8*  ? ? ?ProBNP (last 3 results) ?No results for input(s): PROBNP in the last 8760 hours. ? ? ?D-Dimer ?No results for input(s): DDIMER in the last 72 hours. ?Hemoglobin A1C ?No results for input(s): HGBA1C in the last 72 hours. ?Fasting Lipid Panel ?No results for input(s): CHOL, HDL, LDLCALC, TRIG, CHOLHDL, LDLDIRECT in the last 72 hours. ?Thyroid Function Tests ?Recent Labs  ?  01/05/22 ?1435  ?TSH 9.721*  ? ? ?Other results: ? ? ?Imaging  ? ? ?No results found. ? ? ?Medications:   ? ? ?Scheduled Medications: ? carbamazepine  400 mg Oral BID  ? Chlorhexidine Gluconate Cloth  6 each Topical Daily  ? clopidogrel  75 mg Oral Daily  ? levothyroxine  88 mcg Oral Q0600  ? liothyronine  25 mcg Oral Q0600  ? mouth rinse  15 mL Mouth Rinse BID  ? midodrine  5 mg Oral TID WC  ?  pantoprazole  40 mg Oral Daily  ? rosuvastatin  10 mg Oral Daily  ? sodium chloride flush  10-40 mL Intracatheter Q12H  ? sodium chloride flush  3 mL Intravenous Q12H  ? sodium chloride flush  3 mL Intravenous Q12H  ? Warfarin - Pharmacist Dosing Inpatient   Does not apply [id number]  ? ? ?Infusions: ? sodium chloride    ? sodium chloride    ? sodium chloride    ? furosemide (LASIX) 200 mg in dextrose 5% 100 mL (76m/mL) infusion 7.5 mg/hr (01/06/22 0815)  ? norepinephrine (LEVOPHED) Adult infusion Stopped (01/04/22 0849)  ? potassium chloride    ? ? ?PRN Medications: ?sodium chloride, acetaminophen **OR** acetaminophen, ondansetron **OR** ondansetron (ZOFRAN) IV, oxyCODONE-acetaminophen, senna-docusate, sodium chloride flush, sodium chloride flush ? ? ? ?Assessment/Plan  ? ?1.  Hypotension due to a/c cor pulmonale- Resolved  ?- NE off. MAPs stable in 80s. Continues on midodrine 5 TID ?- Dizziness with positional changes but orthostatics were negative. ?- Continue to hold metoprolol  ?  ?2. 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 RV okay, moderate to severe TR, mild AS with mean gradient of 10 mmHg, severe MAC with mild mitral stenosis mean gradient 5 mmHg ?- CO-OX stable off NE. ?- Continues to diurese well with lasix gtt at 7.5/hr + diamox. Continue current diuretics for now, looks volume up on exam. CVP 8. Will decide on further diuresis pending RHC later this am. ?- CO2 34>39>38 ?- Supp K (3.5), Mag 2.2 ?- RHC this afternoon ?- Continue UNNA boots  ?- Will need cpap and weight loss ?- GDMT limited by soft BP and positional dizziness  ? ?3. Valvular heart disease, moderate to severe TR ?- Echo this admit with EF 65-70%, moderate to severe TR, mild AS with mean gradient 10 mmHg and mild mitral stenosis mean gradient 5 mmHg.  ?  ?4. CAD s/p 4v CABG 2/23 (native SVG to LAD, allograft SVG to posterior descending, allograft SVG to OM1 and OM2, s/p RCA thrombectomy) ?- Denies angina ?- Continue plavix long-term (cryo graft) and rosuvastatin 168m ?  ?5. Acute hypoxic/hypercapnic respiratory failure, likely due to OHS, OSA  ?- plan per CCM ?  ?6. Morbid obesity ?- s/p gastric bypass ?- will need weight loss ?- consider GLP1RA ?  ?7. Falls/weakness ?- PT/OT  ?- not safe to live at home currently. PT recommending SNF ?  ?8. PE ?- Discovered 11/2021  ?- continue warfarin ?- not on DOAC with use of carbamzepine ?- INR 1.8 ? ?9. Anemia ?- Hemoglobin stable. ?- Monitor ? ? ?Disposition: ?Continue aggressive PT/OT. PT recommended SNF for rehab at discharge. ? ? ?Length of Stay: 4 ? ?FINCH, LINDSAY N, PA-C  ?01/06/2022, 8:25 AM ? ?Advanced Heart Failure Team ?Pager [phone]-F; 7a -  5p)  ?Please contact [facility] for night-coverage after hours (5p -7a ) and weekends on [email or web address] ? ?Patient seen and examined with the above-signed Advanced Practice Provider and/or Housestaff. I personally reviewed laboratory data, imaging studies and relevant notes. I independently examined the patient and formulated the important aspects of the plan. I have edited the note to reflect any of my changes or salient points. I have personally discussed the plan with the patient and/or family. ? ?Continues to diurese well. Breathing better. CVP 8. BP stable on midodrine.  ? ?General:  Obese  woamn No resp difficulty ?HEENT: normal ?Neck: supple hard to see jvp. Carotids 2+ bilat; no bruits. No lymphadenopathy or thryomegaly appreciated. ?Cor: PMI nondisplaced. Regular rate & rhythm. 2/6 TR ?Lungs: clear ?Abdomen: obese soft, nontender, nondistended. No hepatosplenomegaly. No bruits or masses. Good bowel sounds. ?Extremities: no cyanosis, clubbing, rash, tr-1+ edema + UNNA ?Neuro: alert & orientedx3, cranial nerves grossly intact. moves all 4 extremities w/o difficulty. Affect pleasant ? ?Continues to diurese well. Will plan RHC today. Needs aggressive PT. Continue midodrine for BP support.  ? ?Glori Bickers, MD  ?11:35 AM ? ?  ? ? ?

## 2022-01-06 NOTE — Progress Notes (Signed)
K+ 3.5  Replaced per protocol  

## 2022-01-07 ENCOUNTER — Encounter (HOSPITAL_COMMUNITY): Payer: Self-pay | Admitting: Internal Medicine

## 2022-01-07 DIAGNOSIS — I10 Essential (primary) hypertension: Secondary | ICD-10-CM

## 2022-01-07 DIAGNOSIS — D649 Anemia, unspecified: Secondary | ICD-10-CM

## 2022-01-07 DIAGNOSIS — E440 Moderate protein-calorie malnutrition: Secondary | ICD-10-CM | POA: Diagnosis present

## 2022-01-07 DIAGNOSIS — G9341 Metabolic encephalopathy: Secondary | ICD-10-CM | POA: Diagnosis present

## 2022-01-07 DIAGNOSIS — Z86711 Personal history of pulmonary embolism: Secondary | ICD-10-CM

## 2022-01-07 LAB — CBC
HCT: 34.1 % — ABNORMAL LOW (ref 36.0–46.0)
Hemoglobin: 10.6 g/dL — ABNORMAL LOW (ref 12.0–15.0)
MCH: 29.5 pg (ref 26.0–34.0)
MCHC: 31.1 g/dL (ref 30.0–36.0)
MCV: 95 fL (ref 80.0–100.0)
Platelets: 413 10*3/uL — ABNORMAL HIGH (ref 150–400)
RBC: 3.59 MIL/uL — ABNORMAL LOW (ref 3.87–5.11)
RDW: 14.4 % (ref 11.5–15.5)
WBC: 4.9 10*3/uL (ref 4.0–10.5)
nRBC: 0 % (ref 0.0–0.2)

## 2022-01-07 LAB — COPPER, SERUM: Copper: 130 ug/dL (ref 80–158)

## 2022-01-07 LAB — CERULOPLASMIN: Ceruloplasmin: 29.2 mg/dL (ref 19.0–39.0)

## 2022-01-07 LAB — PROTIME-INR
INR: 1.7 — ABNORMAL HIGH (ref 0.8–1.2)
Prothrombin Time: 19.5 seconds — ABNORMAL HIGH (ref 11.4–15.2)

## 2022-01-07 LAB — BASIC METABOLIC PANEL
Anion gap: 6 (ref 5–15)
BUN: 27 mg/dL — ABNORMAL HIGH (ref 8–23)
CO2: 35 mmol/L — ABNORMAL HIGH (ref 22–32)
Calcium: 8.6 mg/dL — ABNORMAL LOW (ref 8.9–10.3)
Chloride: 99 mmol/L (ref 98–111)
Creatinine, Ser: 0.99 mg/dL (ref 0.44–1.00)
GFR, Estimated: >60 mL/min (ref >60)
Glucose, Bld: 106 mg/dL — ABNORMAL HIGH (ref 70–99)
Potassium: 3.2 mmol/L — ABNORMAL LOW (ref 3.5–5.1)
Sodium: 140 mmol/L (ref 135–145)

## 2022-01-07 LAB — ZINC: Zinc: 43 ug/dL — ABNORMAL LOW (ref 44–115)

## 2022-01-07 LAB — MAGNESIUM: Magnesium: 2.2 mg/dL (ref 1.7–2.4)

## 2022-01-07 MED ORDER — SODIUM CHLORIDE 0.9 % IV SOLN
510.0000 mg | Freq: Once | INTRAVENOUS | Status: AC
  Administered 2022-01-07: 510 mg via INTRAVENOUS
  Filled 2022-01-07: qty 17

## 2022-01-07 MED ORDER — TORSEMIDE 20 MG PO TABS
40.0000 mg | ORAL_TABLET | Freq: Two times a day (BID) | ORAL | Status: DC
  Administered 2022-01-08: 40 mg via ORAL
  Filled 2022-01-07: qty 2

## 2022-01-07 MED ORDER — POTASSIUM CHLORIDE CRYS ER 20 MEQ PO TBCR
20.0000 meq | EXTENDED_RELEASE_TABLET | ORAL | Status: AC
  Administered 2022-01-07 (×2): 20 meq via ORAL
  Filled 2022-01-07 (×2): qty 1

## 2022-01-07 MED ORDER — MIDODRINE HCL 5 MG PO TABS
2.5000 mg | ORAL_TABLET | Freq: Three times a day (TID) | ORAL | Status: DC
  Administered 2022-01-07 – 2022-01-08 (×4): 2.5 mg via ORAL
  Filled 2022-01-07 (×4): qty 1

## 2022-01-07 MED ORDER — SPIRONOLACTONE 12.5 MG HALF TABLET
12.5000 mg | ORAL_TABLET | Freq: Every day | ORAL | Status: DC
  Administered 2022-01-07 – 2022-01-08 (×2): 12.5 mg via ORAL
  Filled 2022-01-07 (×2): qty 1

## 2022-01-07 MED ORDER — WARFARIN SODIUM 5 MG PO TABS
7.5000 mg | ORAL_TABLET | Freq: Once | ORAL | Status: AC
  Administered 2022-01-07: 7.5 mg via ORAL
  Filled 2022-01-07: qty 1

## 2022-01-07 MED ORDER — POTASSIUM CHLORIDE 10 MEQ/50ML IV SOLN
10.0000 meq | INTRAVENOUS | Status: AC
  Administered 2022-01-07 (×4): 10 meq via INTRAVENOUS
  Filled 2022-01-07 (×4): qty 50

## 2022-01-07 NOTE — Progress Notes (Signed)
K+ 3.2 Replaced per protocol  

## 2022-01-07 NOTE — Assessment & Plan Note (Signed)
Obesity class 3 with calculated BMI 43 ?Continue with nutritional supplements.  ?

## 2022-01-07 NOTE — Progress Notes (Signed)
Occupational Therapy Treatment ?Patient Details ?Name: Deborah Cooke ?MRN: [id number] ?DOB: 1951/09/11 ?Today's Date: 01/07/2022 ? ? ?History of present illness 71 y.o. F who presents with severe volume anasarca volume overload, dyspnea on exertion, hypotension. 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  To undergo RHC 01/06/22. ?  ?OT comments ? Pt progressing towards established OT goals. Pt performing functional mobility in room and then to bathroom with Min guard A and RW. Pt performing toilet transfer with Min Guard A and toilet hygiene and LB bathing with Mod A. Pt performing hand hygiene at sink with Min guard A. Pt requiring increased time throughout due to fatigue. VSS on RA. Pt will continue to follow acutely as admitted and continue to recommend dc to SNF for post acute rehab.   ? ?Recommendations for follow up therapy are one component of a multi-disciplinary discharge planning process, led by the attending physician.  Recommendations may be updated based on patient status, additional functional criteria and insurance authorization. ?   ?Follow Up Recommendations ? Skilled nursing-short term rehab (<3 hours/day)  ?  ?Assistance Recommended at Discharge Frequent or constant Supervision/Assistance  ?Patient can return home with the following ? A little help with walking and/or transfers;A lot of help with bathing/dressing/bathroom ?  ?Equipment Recommendations ? Other (comment) (Defer to next venue)  ?  ?Recommendations for Other Services PT consult ? ?  ?Precautions / Restrictions Precautions ?Precautions: Fall  ? ? ?  ? ?Mobility Bed Mobility ?Overal bed mobility: Needs Assistance ?Bed Mobility: Supine to Sit ?  ?  ?Supine to sit: Min guard, HOB elevated ?  ?  ?General bed mobility comments: Requiring increased time and use of bed rail and elevate HOB ?  ? ?Transfers ?Overall transfer level: Needs assistance ?Equipment used:  Rolling walker (2 wheels) ?Transfers: Sit to/from Stand ?Sit to Stand: Min guard ?  ?  ?  ?  ?  ?General transfer comment: Min guard A for safety ?  ?  ?Balance Overall balance assessment: Needs assistance ?Sitting-balance support: No upper extremity supported, Feet supported ?Sitting balance-Leahy Scale: Fair ?  ?  ?Standing balance support: Bilateral upper extremity supported, During functional activity ?Standing balance-Leahy Scale: Poor ?  ?  ?  ?  ?  ?  ?  ?  ?  ?  ?  ?  ?   ? ?ADL either performed or assessed with clinical judgement  ? ?ADL Overall ADL's : Needs assistance/impaired ?  ?  ?  ?  ?  ?  ?Lower Body Bathing: Moderate assistance;Sit to/from stand ?Lower Body Bathing Details (indicate cue type and reason): Pt able to clean peri area and thighs ?Upper Body Dressing : Minimal assistance;Sitting ?Upper Body Dressing Details (indicate cue type and reason): donning new gown ?  ?  ?Toilet Transfer: Min guard;BSC/3in1;Rolling walker (2 wheels) ?  ?Toileting- Clothing Manipulation and Hygiene: Moderate assistance;Sit to/from stand ?Toileting - Clothing Manipulation Details (indicate cue type and reason): Pt able to perform anterior peri care and requiring assistance for posterior peri care ?  ?  ?Functional mobility during ADLs: Min guard;Rolling walker (2 wheels) ?General ADL Comments: Pt performing functional mobility to dooway and back. Pt then needing to use bathroom. Pt performing toilet transfer and then hand hygiene with Min guard A. Requiring Mod A for LB bathing and peri care. ?  ? ?Extremity/Trunk Assessment Upper Extremity Assessment ?Upper Extremity Assessment: Generalized weakness;RUE deficits/detail ?RUE Deficits /  Details: Recent R TSA ?  ?Lower Extremity Assessment ?Lower Extremity Assessment: Defer to PT evaluation ?  ?  ?  ? ?Vision   ?  ?  ?Perception   ?  ?Praxis   ?  ? ?Cognition Arousal/Alertness: Awake/alert ?Behavior During Therapy: [facility] for tasks assessed/performed ?Overall Cognitive  Status: Within Functional Limits for tasks assessed ?  ?  ?  ?  ?  ?  ?  ?  ?  ?  ?  ?  ?  ?  ?  ?  ?  ?  ?  ?   ?Exercises   ? ?  ?Shoulder Instructions   ? ? ?  ?General Comments VSS on RA  ? ? ?Pertinent Vitals/ Pain       Pain Assessment ?Pain Assessment: 0-10 ?Pain Score: 7  ?Pain Location: headache posterior ?Pain Descriptors / Indicators: Headache ?Pain Intervention(s): Monitored during session, Limited activity within patient's tolerance, Repositioned ? ?Home Living   ?  ?  ?  ?  ?  ?  ?  ?  ?  ?  ?  ?  ?  ?  ?  ?  ?  ?  ? ?  ?Prior Functioning/Environment    ?  ?  ?  ?   ? ?Frequency ? Min 2X/week  ? ? ? ? ?  ?Progress Toward Goals ? ?OT Goals(current goals can now be found in the care plan section) ? Progress towards OT goals: Progressing toward goals ? ?Acute Rehab OT Goals ?OT Goal Formulation: With patient ?Time For Goal Achievement: 01/18/22 ?Potential to Achieve Goals: Good ?ADL Goals ?Pt Will Perform Upper Body Dressing: with min assist;sitting ?Pt Will Perform Lower Body Dressing: with min assist;sit to/from stand ?Pt Will Transfer to Toilet: ambulating;bedside commode;with min guard assist ?Pt Will Perform Toileting - Clothing Manipulation and hygiene: with min assist;sit to/from stand;sitting/lateral leans  ?Plan Discharge plan remains appropriate   ? ?Co-evaluation ? ? ?   ?  ?  ?  ?  ? ?  ?AM-PAC OT "6 Clicks" Daily Activity     ?Outcome Measure ? ? Help from another person eating meals?: A Little ?Help from another person taking care of personal grooming?: A Little ?Help from another person toileting, which includes using toliet, bedpan, or urinal?: A Lot ?Help from another person bathing (including washing, rinsing, drying)?: A Lot ?Help from another person to put on and taking off regular upper body clothing?: A Little ?Help from another person to put on and taking off regular lower body clothing?: A Lot ?6 Click Score: 15 ? ?  ?End of Session Equipment Utilized During Treatment: Rolling  walker (2 wheels) ? ?OT Visit Diagnosis: Unsteadiness on feet (R26.81);Other abnormalities of gait and mobility (R26.89);Muscle weakness (generalized) (M62.81) ?  ?Activity Tolerance Patient tolerated treatment well ?  ?Patient Left in chair;with call bell/phone within reach ?  ?Nurse Communication Mobility status ?  ? ?   ? ?Time: 7614-7092 ?OT Time Calculation (min): 40 min ? ?Charges: OT General Charges ?$OT Visit: 1 Visit ?OT Treatments ?$Self Care/Home Management : 38-52 mins ? ?Cindy Fullman MSOT, OTR/L ?Acute Rehab ?Pager: [phone] ?Office: [phone] ? ?Presly Steinruck M Janeisha Ryle ?01/07/2022, 3:37 PM ? ? ?

## 2022-01-07 NOTE — Progress Notes (Signed)
Orthopedic Tech Progress Note ?Patient Details:  ?Deborah Cooke ?March 06, 1951 ?[id number] ? ?Ortho Devices ?Type of Ortho Device: Unna boot ?Ortho Device/Splint Location: bi-lateral ?Ortho Device/Splint Interventions: Ordered, Application, Adjustment ?  ?Post Interventions ?Patient Tolerated: Well ?Instructions Provided: Care of device, Adjustment of device ? ?Karolee Stamps ?01/07/2022, 3:42 PM ? ?

## 2022-01-07 NOTE — Assessment & Plan Note (Signed)
Patient with improved mentation ?Continue nutritional support and neuro checks per unit protocol ?Pt and Ot.  ?

## 2022-01-07 NOTE — Progress Notes (Addendum)
? ? Advanced Heart Failure Rounding Note ? ?PCP-Cardiologist: Larae Grooms, MD  ? ?Subjective:   ? ?Deborah Cooke 04/19 - normal R and L filling pressures, Fick CO/CI 5.7/2.9 ? ?Remains off NE.  ? ?Diuretics stopped yesterday. Weight down total of 49 lb. ? ?BP stable. ? ?Scr stable at 0.99. K 3.2. ? ?Feeling well. No dyspnea. Leg edema improved but legs are still tender. Has been working with PT/OT. ? ? ?Objective:   ?Weight Range: ?96.7 kg ?Body mass index is 43.06 kg/m?.  ? ?Vital Signs:   ?Temp:  [98.6 ?F (37 ?C)-99.5 ?F (37.5 ?C)] 98.6 ?F (37 ?C) (04/20 2440) ?Pulse Rate:  [95-118] 103 (04/20 0800) ?Resp:  [12-20] 16 (04/20 0800) ?BP: (86-140)/(59-89) 125/78 (04/20 0800) ?SpO2:  [92 %-100 %] 98 % (04/20 0800) ?Weight:  [96.7 kg] 96.7 kg (04/20 0500) ?Last BM Date : 01/06/22 ? ?Weight change: ?Filed Weights  ? 01/05/22 0500 01/06/22 0500 01/07/22 0500  ?Weight: 103.5 kg 99.5 kg 96.7 kg  ? ? ?Intake/Output:  ? ?Intake/Output Summary (Last 24 hours) at 01/07/2022 0938 ?Last data filed at 01/07/2022 1027 ?Gross per 24 hour  ?Intake 147.72 ml  ?Output 1250 ml  ?Net -1102.28 ml  ?  ? ? ?Physical Exam  ?CVP 3 ?General:  No distress. Lying in bed. ?HEENT: normal ?Neck: supple. no JVD. Carotids 2+ bilat; no bruits.  ?Cor: PMI nondisplaced. Regular rate & rhythm. No rubs, gallops or murmurs. ?Lungs: clear ?Abdomen: obese, soft, nontender, nondistended.  ?Extremities: no cyanosis, clubbing, rash, edema, + UNNA ?Neuro: alert & orientedx3, cranial nerves grossly intact. moves all 4 extremities w/o difficulty. Affect pleasant ? ? ? ? ?Telemetry  ? ?SR/Sinus tach 90s-100s, 1-2 PVCs/min ? ? ?Labs  ?  ?CBC ?Recent Labs  ?  01/06/22 ?0423 01/06/22 ?1222 01/06/22 ?1226 01/07/22 ?2536  ?WBC 4.9  --   --  4.9  ?HGB 10.8*   < > 12.2 10.6*  ?HCT 33.9*   < > 36.0 34.1*  ?MCV 95.2  --   --  95.0  ?PLT 394  --   --  413*  ? < > = values in this interval not displayed.  ? ?Basic Metabolic Panel ?Recent Labs  ?  01/06/22 ?0423 01/06/22 ?1222  01/06/22 ?1226 01/07/22 ?6440  ?NA 138   < > 138 140  ?K 3.5   < > 3.9 3.2*  ?CL 94*  --   --  99  ?CO2 38*  --   --  35*  ?GLUCOSE 103*  --   --  106*  ?BUN 25*  --   --  27*  ?CREATININE 0.91  --   --  0.99  ?CALCIUM 8.6*  --   --  8.6*  ?MG 2.2  --   --  2.2  ? < > = values in this interval not displayed.  ? ?Liver Function Tests ?No results for input(s): AST, ALT, ALKPHOS, BILITOT, PROT, ALBUMIN in the last 72 hours. ?No results for input(s): LIPASE, AMYLASE in the last 72 hours. ?Cardiac Enzymes ?No results for input(s): CKTOTAL, CKMB, CKMBINDEX, TROPONINI in the last 72 hours. ? ?BNP: ?BNP (last 3 results) ?Recent Labs  ?  11/26/21 ?1119 01/02/22 ?1813  ?BNP 482.4* 405.8*  ? ? ?ProBNP (last 3 results) ?No results for input(s): PROBNP in the last 8760 hours. ? ? ?D-Dimer ?No results for input(s): DDIMER in the last 72 hours. ?Hemoglobin A1C ?No results for input(s): HGBA1C in the last 72 hours. ?Fasting Lipid Panel ?No results  for input(s): CHOL, HDL, LDLCALC, TRIG, CHOLHDL, LDLDIRECT in the last 72 hours. ?Thyroid Function Tests ?Recent Labs  ?  01/05/22 ?1435  ?TSH 9.721*  ? ? ?Other results: ? ? ?Imaging  ? ? ?CARDIAC CATHETERIZATION ? ?Result Date: 01/06/2022 ?Findings: RA = 2 RV = 29/2 PA = 26/11 (18) PCW = 9 Fick cardiac output/index = 5.7/2.9 Thermo CO/CI = 5.5/2.8 PVR = 1.6 WU Ao sat = 96% PA sat = 65%, 66% SVC sat = 69% Assessment: 1. Normal hemodynamics after marked diuresis suggests primarily pulmonary venous HTN Plan/Discussion: Medical therapy. Can stop IV diuresis for now. Glori Bickers, MD 12:35 PM    ? ? ?Medications:   ? ? ?Scheduled Medications: ? (feeding supplement) PROSource Plus  30 mL Oral BID BM  ? buPROPion  300 mg Oral Daily  ? calcium carbonate  1 tablet Oral TID  ? carbamazepine  400 mg Oral BID  ? Chlorhexidine Gluconate Cloth  6 each Topical Daily  ? clopidogrel  75 mg Oral Daily  ? DULoxetine  30 mg Oral BID  ? levothyroxine  88 mcg Oral Q0600  ? liothyronine  25 mcg Oral Q0600   ? mouth rinse  15 mL Mouth Rinse BID  ? midodrine  5 mg Oral TID WC  ? multivitamin with minerals  1 tablet Oral BID  ? pantoprazole  40 mg Oral Daily  ? potassium chloride  20 mEq Oral Q4H  ? rosuvastatin  10 mg Oral Daily  ? sodium chloride flush  10-40 mL Intracatheter Q12H  ? sodium chloride flush  3 mL Intravenous Q12H  ? sodium chloride flush  3 mL Intravenous Q12H  ? vortioxetine HBr  20 mg Oral Daily  ? Warfarin - Pharmacist Dosing Inpatient   Does not apply J5009  ? ? ?Infusions: ? sodium chloride 250 mL (01/07/22 0646)  ? sodium chloride    ? potassium chloride 10 mEq (01/07/22 0841)  ? ? ?PRN Medications: ?sodium chloride, acetaminophen **OR** acetaminophen, ondansetron **OR** ondansetron (ZOFRAN) IV, oxyCODONE-acetaminophen, senna-docusate, sodium chloride flush, sodium chloride flush ? ? ? ?Assessment/Plan  ? ?1. Hypotension due to a/c cor pulmonale- Resolved  ?- NE off. BP stable. Continues on midodrine 5 TID. Can decrease midodrine to 2.5 mg TID ?- Dizziness with positional changes but orthostatics were negative. PT completing vestibular evaluation ?- Continue to hold metoprolol  ?  ?2. 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 RV okay, moderate to severe TR, mild AS with mean gradient of 10 mmHg, severe MAC with mild mitral stenosis mean gradient 5 mmHg ?- CO-OX stable off NE. ?- RHC 04/19 with normal hemodynamics after diuresis suggesting primarily pulmonary venous HTN ?- CVP 3 today. Start po Torsemide 40 mg BID tomorrow. ?- Add spiro 12.5 mg daily  ?- Will need cpap and weight loss ?- GDMT limited by soft BP and positional dizziness  ? ?3. Valvular heart disease, moderate to severe TR ?- Echo this admit with EF 65-70%, moderate to severe TR, mild AS with mean gradient 10 mmHg and mild mitral stenosis mean gradient 5 mmHg.  ?  ?4. 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  ?5. Acute hypoxic/hypercapnic respiratory failure, likely  due to OHS, OSA  ?- plan per CCM ?  ?6. Morbid obesity ?- s/p gastric bypass ?- will need weight loss ?- consider GLP1RA ?  ?7. Falls/weakness ?- PT/OT  ?- not safe to live at home currently. SNF for rehab at discharge.  ?  ?8. PE ?- Discovered 11/2021  ?- continue warfarin ?- not on DOAC with use of carbamzepine ?- INR 1.7 ? ?9. Anemia ?- Hemoglobin stable. ?- Monitor ? ? ?Disposition: ?Continue aggressive PT/OT. PT recommended SNF for rehab at discharge. ? ? ?Restart po diuretics tomorrow. Will follow at a distance. ? ? ?Length of Stay: 5 ? ?FINCH, LINDSAY N, PA-C  ?01/07/2022, 9:38 AM ? ?Advanced Heart Failure Team ?Pager [phone] (M-F; 7a - 5p)  ?Please contact [facility] for night-coverage after hours (5p -7a ) and weekends on [email or web address] ? ?Patient seen and examined with the above-signed Advanced Practice Provider and/or Housestaff. I personally reviewed laboratory data, imaging studies and relevant notes. I independently examined the patient and formulated the important aspects of the plan. I have edited the note to reflect any of my changes or salient points. I have personally discussed the plan with the patient and/or family. ? ?[place] results reviewed with her and her son. Filling pressures surprisingly normal. Remains weak. Denies SOB, orthopnea or PND. Drinking a lot of fluid.  ? ?General:  Lying flat in bed. No resp difficulty ?HEENT: normal ?Neck: supple. Unable to see JVP. Carotids 2+ bilat; no bruits. No lymphadenopathy or thryomegaly appreciated. ?Cor: PMI nondisplaced. Regular rate & rhythm. No rubs, gallops or murmurs. ?Lungs: clear ?Abdomen: obese soft, nontender, nondistended. No hepatosplenomegaly. No bruits or masses. Good bowel sounds. ?Extremities: no cyanosis,  clubbing, rash, tr edema ?Neuro: alert & orientedx3, cranial nerves grossly intact. moves all 4 extremities w/o difficulty. Affect pleasant ? ?Filling pressures much improved.Would hold lasix one more day and sta

## 2022-01-07 NOTE — Progress Notes (Signed)
?Progress Note ? ? ?Patient: Deborah Cooke TGY:[id number] DOB: Nov 28, 1950 DOA: 01/02/2022     5 ?DOS: the patient was seen and examined on 01/07/2022 ?  ?Brief hospital course: ?Deborah Cooke was admitted to the hospital with the working diagnosis of decompensated heart failure.  ? ?71 y.o. 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  ?Recent hospitalization 03/09 to 12/01/21 at that time she was diagnosed with pulmonary embolism and decompensated heart failure, she was discharged to SNF and from there she was posteriorly discharged home. At home patient developed progressive dyspnea, lower extremity edema, 15 lbs weight gain and PND, that prompted her to come back to the hospital. On her initial physical examination her blood pressure was 126/114, HR 94, RR 26 and 02 saturation 94%, lungs with no wheezing or rhonchi, heart with S1 and S2 present with no gallops, rhythmic, abdomen not distended, positive +++ pitting bilateral lower extremity edema.  ? ?Na 135, K 4,8 Cl 101, bicarbonate 24, glucose 110 bun 33 cr 1,20 ?BNP 405  ?High sensitive troponin 54 and 48  ?Sars covid 19 negative ? ?Urine SG 1,010  ? ?Chest radiograph with cardiomegaly and bilateral hilar vascular congestion. ? ?EKG 93 bpm, left axis deviation, normal intervals, sinus rhythm with a noisy baseline, low voltage with no significant ST segment or T wave changes.  ? ? ?Patient was placed on furosemide for diuresis.  ?Overnight on second hospital day, developed hypotension, so was transferred to ICU.  Suspect this was diuresis plus medications with vasodilatory and anti-inotropic effects including opiates, seroquel and gabapentin.   ? ?Patient was placed on norepinephrine for hypotension with good toleration. ?Patient was transitioned to midordrine.  ?04/19 Right heart catheterization  PCW 9, cardiac output 5,7 and index 2,9. (Fick).  ? ?Transfer to [facility] on 04/20  ? ?AHF following, underwent R/LHC on 4/19. ? ? ?Assessment and Plan: ?* Acute on chronic congestive heart failure ([place]) ?Echocardiogram with preserved LV systolic function with EF 65 to 70%, preserved RV systolic function, moderate to severe tricuspid valve regurgitation.  ? ?Patient was placed on norepinephrine drip and furosemide drip.  ?04/17 discontinued norepinephrine.  ?04/19 discontinue furosemide drip.  ? ?Urine output over last 24 hrs is 1,250 ml, since admission negative fluid balance at 14,000 ml.  ? ?Cardiac catheterization with PCWP of 9 with cardiac output 5.7 and index 2.9 (Fick).  ?PA mean 18  ? ?Systolic blood pressure 287 to 140 mmHg range.  ?Continue wean off midodrine. ?Continue with spironolactone and resume torsemide tomorrow.  ?Out of bed to chair tid with meals, PT and OT.  ? ? ? ?Acute on chronic respiratory failure with hypoxia and hypercapnia (HCC) ?Acute hypoxemia due to acute cardiogenic pulmonary edema.  ?Chronic OSA and OHS.  ?Patient has tolerated well diuresis, with improvement in oxygenation. ? ?Today 02 saturation is 97 to 98% on room air. ?Continue oxymetry monitoring.  ? ?History of pulmonary embolism ?Continue anticoagulation with warfarin.  ?INR today is 1.7  ? ?AKI (acute kidney injury) ([place]) ?Hypokalemia.  ? ?Renal function with serum cr at 0,99 with K at 3,2 and serum bicarbonate at 35.  ?Mg is 2,2 ? ?Plan to continue K correction with Kcl and follow up renal function in am. ?Plan to resume diuresis with torsemide on 04/21.  ? ?Coronary artery disease involving native coronary artery of native heart  without angina pectoris ?S/p CABG x4 10/27/2021.  She denies any chest pain.  Troponin minimally elevated and to rending down on repeat.  EKG without acute ischemic changes.  Doubt ACS at this time. ?-Continue Plavix, rosuvastatin, Toprol-XL ? ?Essential hypertension ?Continue blood pressure support with  midodrine. ?Keep MAP 65 or greater. ? ? ?Hypothyroidism ?Continue home levothyroxine and liothyronine. ? ?Chronic pain disorder ?Depression.  ?Patient on bupropion, vortioxetine, duloxetine, carbamazepine and oxycodone with good toleration.  ? ?Acute metabolic encephalopathy ?Patient with improved mentation ?Continue nutritional support and neuro checks per unit protocol ?Pt and Ot.  ? ?Anemia ?Anemia of chronic disease.  ?hgb has been stable at 10.6 range.  ? ?Malnutrition of moderate degree ?Obesity class 3 with calculated BMI 43 ?Continue with nutritional supplements.  ? ? ? ? ?  ? ?Subjective: Patient is feeling better, her dyspnea and edema have improved ? ?Physical Exam: ?Vitals:  ? 01/07/22 0808 01/07/22 0900 01/07/22 1000 01/07/22 1100  ?BP:  (!) 126/91 120/73 (!) 146/77  ?Pulse: (!) 103 (!) 102 (!) 101 (!) 106  ?Resp: 15 17 (!) 21 17  ?Temp: 98.6 ?F (37 ?C)     ?TempSrc: Oral     ?SpO2: 96% 96% 97% 96%  ?Weight:      ? ?Neurology awake and alert ?ENT with mild pallor ?Cardiovascular with S1 and S2 present and rhythmic with no gallops rubs or murmurs ?No JVD ?Non pitting lower extremity edema, bilateral leg wraps ?Respiratory with no wheezing or rhonchi ?Abdomen not distended ? ?Data Reviewed: ? ? ? ?Family Communication: no family at the bedside  ? ?Disposition: ?Status is: Inpatient ?Remains inpatient appropriate because: heart failure  ? Planned Discharge Destination: Skilled nursing facility ? ? ? ?Author: ?Tawni Millers, MD ?01/07/2022 11:46 AM ? ?For on call review [email or web address].  ?

## 2022-01-07 NOTE — TOC Progression Note (Signed)
Transition of Care (TOC) - Progression Note  ? ? ?Patient Details  ?Name: TANISHKA DROLET ?MRN: [id number] ?Date of Birth: September 22, 1950 ? ?Transition of Care (TOC) CM/SW Contact  ?Milas Gain, LCSWA ?Phone Number: ?01/07/2022, 2:58 PM ? ?Clinical Narrative:    ? ?CSW started insurance authorization for patient. Reference # [id number]. Patient has SNF bed at [facility]. CSW will continue to follow and assist with patients dc planning needs. ? ?Expected Discharge Plan: [place] ?Barriers to Discharge: Continued Medical Work up ? ?Expected Discharge Plan and Services ?Expected Discharge Plan: [place] ?  ?  ?Post Acute Care Choice: [place] ?Living arrangements for the past 2 months: [place] ?                ?  ?  ?  ?  ?  ?  ?  ?  ?  ?  ? ? ?Social Determinants of Health (SDOH) Interventions ?Housing Interventions: Intervention Not Indicated ?Transportation Interventions: Intervention Not Indicated ? ?Readmission Risk Interventions ?   ? View : No data to display.  ?  ?  ?  ? ? ?

## 2022-01-07 NOTE — Assessment & Plan Note (Addendum)
Anemia of chronic disease with iron deficiency.  ?At the time of her discharge her hgb is 11.9 with plt 450 (thrombocytosis). ?Follow up cell count as outpatient.  ?Continue with oral iron supplementation.  ?

## 2022-01-07 NOTE — Assessment & Plan Note (Addendum)
Acute hypoxemia due to acute cardiogenic pulmonary edema.  ?Chronic OSA and OHS.  ?Patient has tolerated well diuresis, with improvement in oxygenation. ? ?At the time of her discharge her 02 saturation is 97 to 98% on room air. ?Continue diuresis as outpatient.  ?

## 2022-01-07 NOTE — Progress Notes (Signed)
ANTICOAGULATION CONSULT NOTE - Follow Up Consult ? ?Pharmacy Consult for Warfarin ?Indication: pulmonary embolus - recent 3/23 ? ?Allergies  ?Allergen Reactions  ? Nsaids Nausea And Vomiting and Other (See Comments)  ?  GI Upset (ibuprofen included)  ? Ibuprofen Other (See Comments)  ?  Other reaction(s): Abdominal Pain  ? Tolmetin Other (See Comments)  ?  Other reaction(s): Abdominal Pain ?  ? ? ?Patient Measurements: ?Weight: 96.7 kg (213 lb 3 oz) ? ? ?Vital Signs: ?Temp: 98.6 ?F (37 ?C) (04/20 6213) ?Temp Source: Oral (04/20 0865) ?BP: 125/78 (04/20 0800) ?Pulse Rate: 103 (04/20 0800) ? ?Labs: ?Recent Labs  ?  01/05/22 ?0427 01/06/22 ?0423 01/06/22 ?1222 01/06/22 ?1226 01/07/22 ?7846  ?HGB 11.0* 10.8* 10.9*  12.6 12.2 10.6*  ?HCT 34.4* 33.9* 32.0*  37.0 36.0 34.1*  ?PLT 388 394  --   --  413*  ?LABPROT 22.9* 20.8*  --   --  19.5*  ?INR 2.0* 1.8*  --   --  1.7*  ?CREATININE 0.90 0.91  --   --  0.99  ? ? ? ?Estimated Creatinine Clearance: 51.6 mL/min (by C-G formula based on SCr of 0.99 mg/dL). ? ? ?Assessment: ?71 year old female admitted with dyspnea to continue warfarin for PE diagnosed in March.   ?INR on admission 1.9.  ? ?INR dropped slightly to 1.7 today after reducing dose 4/18. Will give slightly higher dose tonight. Hgb 10s, plts stable. No s/x of bleeding noted. ? ?Dose PTA - > 4.5 mg Tuesday and Thursday, 3 mg MWFSS ? ?Goal of Therapy:  ?INR 2-3 ?Monitor platelets by anticoagulation protocol: Yes ?  ?Plan:  ?Warfarin 7.5 x1 today ?Daily INR and CBC ? ?Erin Hearing PharmD., BCPS ?Clinical Pharmacist ?01/07/2022 9:46 AM ? ?

## 2022-01-08 ENCOUNTER — Other Ambulatory Visit (HOSPITAL_COMMUNITY): Payer: Self-pay

## 2022-01-08 DIAGNOSIS — E440 Moderate protein-calorie malnutrition: Secondary | ICD-10-CM

## 2022-01-08 DIAGNOSIS — E039 Hypothyroidism, unspecified: Secondary | ICD-10-CM

## 2022-01-08 DIAGNOSIS — I5033 Acute on chronic diastolic (congestive) heart failure: Secondary | ICD-10-CM

## 2022-01-08 LAB — CBC
HCT: 38.1 % (ref 36.0–46.0)
Hemoglobin: 11.9 g/dL — ABNORMAL LOW (ref 12.0–15.0)
MCH: 29.6 pg (ref 26.0–34.0)
MCHC: 31.2 g/dL (ref 30.0–36.0)
MCV: 94.8 fL (ref 80.0–100.0)
Platelets: 450 10*3/uL — ABNORMAL HIGH (ref 150–400)
RBC: 4.02 MIL/uL (ref 3.87–5.11)
RDW: 14.3 % (ref 11.5–15.5)
WBC: 5.8 10*3/uL (ref 4.0–10.5)
nRBC: 0 % (ref 0.0–0.2)

## 2022-01-08 LAB — BASIC METABOLIC PANEL
Anion gap: 9 (ref 5–15)
BUN: 24 mg/dL — ABNORMAL HIGH (ref 8–23)
CO2: 27 mmol/L (ref 22–32)
Calcium: 9.3 mg/dL (ref 8.9–10.3)
Chloride: 102 mmol/L (ref 98–111)
Creatinine, Ser: 0.79 mg/dL (ref 0.44–1.00)
GFR, Estimated: >60 mL/min (ref >60)
Glucose, Bld: 115 mg/dL — ABNORMAL HIGH (ref 70–99)
Potassium: 3.8 mmol/L (ref 3.5–5.1)
Sodium: 138 mmol/L (ref 135–145)

## 2022-01-08 LAB — PROTIME-INR
INR: 1.6 — ABNORMAL HIGH (ref 0.8–1.2)
Prothrombin Time: 18.8 seconds — ABNORMAL HIGH (ref 11.4–15.2)

## 2022-01-08 LAB — RESP PANEL BY RT-PCR (FLU A&B, COVID) ARPGX2
Influenza A by PCR: NEGATIVE
Influenza B by PCR: NEGATIVE
SARS Coronavirus 2 by RT PCR: NEGATIVE

## 2022-01-08 LAB — VITAMIN A: Vitamin A (Retinoic Acid): 49.5 ug/dL (ref 22.0–69.5)

## 2022-01-08 LAB — MAGNESIUM: Magnesium: 2.6 mg/dL — ABNORMAL HIGH (ref 1.7–2.4)

## 2022-01-08 MED ORDER — CERTAVITE/ANTIOXIDANTS PO TABS
1.0000 | ORAL_TABLET | Freq: Two times a day (BID) | ORAL | 0 refills | Status: DC
  Filled 2022-01-08: qty 60, 30d supply, fill #0

## 2022-01-08 MED ORDER — CARBAMAZEPINE ER 200 MG PO TB12: 200.0000 mg | ORAL_TABLET | Freq: Two times a day (BID) | ORAL | 41 refills | Status: DC

## 2022-01-08 MED ORDER — TORSEMIDE 20 MG PO TABS
40.0000 mg | ORAL_TABLET | Freq: Two times a day (BID) | ORAL | 0 refills | Status: DC
  Filled 2022-01-08: qty 120, 30d supply, fill #0

## 2022-01-08 MED ORDER — MIDODRINE HCL 2.5 MG PO TABS
2.5000 mg | ORAL_TABLET | Freq: Three times a day (TID) | ORAL | 0 refills | Status: DC
  Filled 2022-01-08: qty 90, 30d supply, fill #0

## 2022-01-08 MED ORDER — PROSOURCE PLUS PO LIQD
30.0000 mL | Freq: Two times a day (BID) | ORAL | 0 refills | Status: DC
  Filled 2022-01-08: qty 1800, 30d supply, fill #0

## 2022-01-08 MED ORDER — QUETIAPINE FUMARATE 300 MG PO TABS
150.0000 mg | ORAL_TABLET | Freq: Every day | ORAL | 0 refills | Status: DC
  Filled 2022-01-08: qty 15, 30d supply, fill #0

## 2022-01-08 MED ORDER — SPIRONOLACTONE 25 MG PO TABS
12.5000 mg | ORAL_TABLET | Freq: Every day | ORAL | 0 refills | Status: DC
  Filled 2022-01-08 – 2022-02-08 (×2): qty 15, 30d supply, fill #0

## 2022-01-08 MED ORDER — CYCLOBENZAPRINE HCL 10 MG PO TABS
10.0000 mg | ORAL_TABLET | Freq: Three times a day (TID) | ORAL | 0 refills | Status: AC | PRN
Start: 2022-01-08 — End: ?

## 2022-01-08 MED ORDER — POTASSIUM CHLORIDE CRYS ER 20 MEQ PO TBCR
40.0000 meq | EXTENDED_RELEASE_TABLET | Freq: Once | ORAL | Status: AC
  Administered 2022-01-08: 40 meq via ORAL
  Filled 2022-01-08: qty 2

## 2022-01-08 MED ORDER — OXYCODONE-ACETAMINOPHEN 5-325 MG PO TABS: 1.0000 | ORAL_TABLET | Freq: Four times a day (QID) | ORAL | 0 refills | Status: DC | PRN

## 2022-01-08 NOTE — Plan of Care (Signed)
Pt going to SNF for rehab transport by [facility] ?

## 2022-01-08 NOTE — Progress Notes (Signed)
Pt. D/C to SNF phone, charger, and dentures sent with pt. No incidents noted, PICC and PIVs removed, [facility] transport ?

## 2022-01-08 NOTE — Progress Notes (Signed)
ANTICOAGULATION CONSULT NOTE - Follow Up Consult ? ?Pharmacy Consult for Warfarin ?Indication: pulmonary embolus - recent 3/23 ? ?Allergies  ?Allergen Reactions  ? Nsaids Nausea And Vomiting and Other (See Comments)  ?  GI Upset (ibuprofen included)  ? Ibuprofen Other (See Comments)  ?  Other reaction(s): Abdominal Pain  ? Tolmetin Other (See Comments)  ?  Other reaction(s): Abdominal Pain ?  ? ? ?Patient Measurements: ?Height: '4\' 11"'$  (149.9 cm) ?Weight: 96 kg (211 lb 10.3 oz) ?IBW/kg (Calculated) : 43.2 ? ? ?Vital Signs: ?Temp: 97.6 ?F (36.4 ?C) (04/21 0745) ?Temp Source: Oral (04/21 0745) ?BP: 137/94 (04/21 1200) ?Pulse Rate: 108 (04/21 0622) ? ?Labs: ?Recent Labs  ?  01/06/22 ?0423 01/06/22 ?1222 01/06/22 ?1226 01/07/22 ?5859 01/08/22 ?0242  ?HGB 10.8*   < > 12.2 10.6* 11.9*  ?HCT 33.9*   < > 36.0 34.1* 38.1  ?PLT 394  --   --  413* 450*  ?LABPROT 20.8*  --   --  19.5* 18.8*  ?INR 1.8*  --   --  1.7* 1.6*  ?CREATININE 0.91  --   --  0.99 0.79  ? < > = values in this interval not displayed.  ? ? ? ?Estimated Creatinine Clearance: 63.6 mL/min (by C-G formula based on SCr of 0.79 mg/dL). ? ? ?Assessment: ?71 year old female admitted with dyspnea to continue warfarin for PE diagnosed in March.   ?INR on admission 1.9.  ? ?INR dropped slightly to 1.6 today after reducing dose 4/18. Gave boosted dose past two days. Back to home dose today. ? ?Dose PTA - > 4.5 mg Tuesday and Thursday, 3 mg MWFSS ? ?Goal of Therapy:  ?INR 2-3 ?Monitor platelets by anticoagulation protocol: Yes ?  ?Plan:  ?Warfarin 4.5 mg Tuesday and Thursday, 3 mg MWFSS as prior to admit ?Daily INR and CBC ? ?Erin Hearing PharmD., BCPS ?Clinical Pharmacist ?01/08/2022 3:29 PM ? ?

## 2022-01-08 NOTE — Progress Notes (Signed)
Physical Therapy Treatment ?Patient Details ?Name: Deborah Cooke ?MRN: [id number] ?DOB: 04-10-1951 ?Today's Date: 01/08/2022 ? ? ?History of Present Illness 71 y.o. F who presents with severe volume anasarca volume overload, dyspnea on exertion, hypotension. 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  To undergo RHC 01/06/22. ? ?  ?PT Comments  ? ? Session focused on advancing gait, with pt progressing to ambulating ~40 ft with a RW at a min guard assist level. Pt also demonstrating improved lower extremity strength through performing x4 sit<> stand reps without UE assistance. Will continue to follow acutely. Current recommendations remain appropriate. ? ?  ?Recommendations for follow up therapy are one component of a multi-disciplinary discharge planning process, led by the attending physician.  Recommendations may be updated based on patient status, additional functional criteria and insurance authorization. ? ?Follow Up Recommendations ? Skilled nursing-short term rehab (<3 hours/day) ?  ?  ?Assistance Recommended at Discharge Frequent or constant Supervision/Assistance  ?Patient can return home with the following A little help with walking and/or transfers;A little help with bathing/dressing/bathroom;Assistance with cooking/housework;Assist for transportation;Help with stairs or ramp for entrance ?  ?Equipment Recommendations ? None recommended by PT  ?  ?Recommendations for Other Services   ? ? ?  ?Precautions / Restrictions Precautions ?Precautions: Fall ?Restrictions ?Weight Bearing Restrictions: No  ?  ? ?Mobility ? Bed Mobility ?  ?  ?  ?  ?  ?  ?  ?General bed mobility comments: Pt up in chair upon arrival. ?  ? ?Transfers ?Overall transfer level: Needs assistance ?Equipment used: Rolling walker (2 wheels) ?Transfers: Sit to/from Stand ?Sit to Stand: Min guard ?  ?  ?  ?  ?  ?General transfer comment: Min guard A for safety coming  to stand 6x from recliner, 4x without UE usage. ?  ? ?Ambulation/Gait ?Ambulation/Gait assistance: Min guard ?Gait Distance (Feet): 40 Feet ?Assistive device: Rolling walker (2 wheels) ?Gait Pattern/deviations: Step-through pattern, Decreased stride length, Shuffle, Trunk flexed ?Gait velocity: decreased ?Gait velocity interpretation: <1.8 ft/sec, indicate of risk for recurrent falls ?  ?General Gait Details: Pt with slow, shuffling gait and flexed posture, needing cues to relax shoulders and look superiorly. No LOBm, min guard for safety ? ? ?Stairs ?  ?  ?  ?  ?  ? ? ?Wheelchair Mobility ?  ? ?Modified Rankin (Stroke Patients Only) ?  ? ? ?  ?Balance Overall balance assessment: Needs assistance ?Sitting-balance support: No upper extremity supported, Feet supported ?Sitting balance-Leahy Scale: Fair ?  ?  ?Standing balance support: Bilateral upper extremity supported, During functional activity, No upper extremity supported ?Standing balance-Leahy Scale: Fair ?Standing balance comment: Able to stand without UE support, benefits from RW for gait. ?  ?  ?  ?  ?  ?  ?  ?  ?  ?  ?  ?  ? ?  ?Cognition Arousal/Alertness: Awake/alert ?Behavior During Therapy: [facility] for tasks assessed/performed ?Overall Cognitive Status: Within Functional Limits for tasks assessed ?  ?  ?  ?  ?  ?  ?  ?  ?  ?  ?  ?  ?  ?  ?  ?  ?  ?  ?  ? ?  ?Exercises Other Exercises ?Other Exercises: sit <> stand from recliner 5x in a row, 4x without UE utilization ? ?  ?General Comments General comments (skin integrity, edema, etc.): VSS on RA ?  ?  ? ?  Pertinent Vitals/Pain Pain Assessment ?Pain Assessment: Faces ?Faces Pain Scale: Hurts a little bit ?Pain Location: generalized with mobility ?Pain Descriptors / Indicators: Grimacing ?Pain Intervention(s): Limited activity within patient's tolerance, Monitored during session, Repositioned  ? ? ?Home Living   ?  ?  ?  ?  ?  ?  ?  ?  ?  ?   ?  ?Prior Function    ?  ?  ?   ? ?PT Goals (current goals can now  be found in the care plan section) Acute Rehab PT Goals ?Patient Stated Goal: to go to rehab ?PT Goal Formulation: With patient ?Time For Goal Achievement: 01/18/22 ?Potential to Achieve Goals: Good ?Progress towards PT goals: Progressing toward goals ? ?  ?Frequency ? ? ? Min 3X/week ? ? ? ?  ?PT Plan Current plan remains appropriate  ? ? ?Co-evaluation   ?  ?  ?  ?  ? ?  ?AM-PAC PT "6 Clicks" Mobility   ?Outcome Measure ? Help needed turning from your back to your side while in a flat bed without using bedrails?: A Lot ?Help needed moving from lying on your back to sitting on the side of a flat bed without using bedrails?: A Lot ?Help needed moving to and from a bed to a chair (including a wheelchair)?: A Little ?Help needed standing up from a chair using your arms (e.g., wheelchair or bedside chair)?: A Little ?Help needed to walk in hospital room?: A Little ?Help needed climbing 3-5 steps with a railing? : A Lot ?6 Click Score: 15 ? ?  ?End of Session   ?Activity Tolerance: Patient tolerated treatment well ?Patient left: in chair;with call bell/phone within reach ?Nurse Communication: Mobility status ?PT Visit Diagnosis: Unsteadiness on feet (R26.81);Muscle weakness (generalized) (M62.81);Difficulty in walking, not elsewhere classified (R26.2);Other abnormalities of gait and mobility (R26.89) ?  ? ? ?Time: 6659-9357 ?PT Time Calculation (min) (ACUTE ONLY): 12 min ? ?Charges:  $Therapeutic Activity: 8-22 mins          ?          ? ?Moishe Spice, PT, DPT ?Acute Rehabilitation Services  ?Pager: [phone] ?Office: [phone] ? ? ? ?[name] ?01/08/2022, 2:37 PM ? ?

## 2022-01-08 NOTE — Discharge Summary (Addendum)
?Physician Discharge Summary ?  ?Patient: Deborah Cooke MRN: [id number] DOB: 1951/09/16  ?Admit date:     01/02/2022  ?Discharge date: 01/08/22  ?Discharge Physician: Jimmy Picket Afia Messenger  ? ?PCP: Rich Fuchs, PA  ? ?Recommendations at discharge:  ? ? Patient will continue diuresis with torsemide and spironolactone ?Continue blood pressure support with midodrine ?Follow renal function as outpatient.  ?To prevent drug drug interaction and polypharmacy, decreased carbamazepine to 200 mg bid, decrease quetiapine to 150 mg, change flexeril to as needed and discontinue gabapentin.  ?Follow up with primary care in 7 to 10 days.   ? ?Discharge Diagnoses: ?Principal Problem: ?  Acute on chronic congestive heart failure ([place]) ?Active Problems: ?  Acute on chronic respiratory failure with hypoxia and hypercapnia (H[facility]) ?  History of pulmonary embolism ?  AKI (acute kidney injury) ([place]) ?  Coronary artery disease involving native coronary artery of native heart without angina pectoris ?  Essential hypertension ?  Hypothyroidism ?  Chronic pain disorder ?  Acute metabolic encephalopathy ?  Anemia ?  Malnutrition of moderate degree ? ?Resolved Problems: ?  * No resolved hospital problems. * ? ?Hospital Course: ?Mrs. Heider was admitted to the hospital with the working diagnosis of decompensated heart failure.  ? ?71 y.o. F with CAD s/p CABG x4 10/27/2021, PE on Coumadin, chronic cor pulmonale (TTE 10/27/2021 shows EF 60-65%, G2DD), HTN, hypothyroidism, bipolar 2 disorder, depression/anxiety, chronic pain who presented with progressive dyspnea on exertion.   ?Recent hospitalization 03/09 to 12/01/21 at that time she was diagnosed with pulmonary embolism and decompensated heart failure, she was discharged to SNF and from there she was posteriorly discharged home. At home patient developed progressive dyspnea, lower extremity edema, 15 lbs weight gain and PND, that prompted her to come back to the hospital. On her initial physical  examination her blood pressure was 126/114, HR 94, RR 26 and 02 saturation 94%, lungs with no wheezing or rhonchi, heart with S1 and S2 present with no gallops, rhythmic, abdomen not distended, positive +++ pitting bilateral lower extremity edema.  ? ?Na 135, K 4,8 Cl 101, bicarbonate 24, glucose 110 bun 33 cr 1,20 ?BNP 405  ?High sensitive troponin 54 and 48  ?Sars covid 19 negative ? ?Urine SG 1,010  ? ?Chest radiograph with cardiomegaly and bilateral hilar vascular congestion. ? ?EKG 93 bpm, left axis deviation, normal intervals, sinus rhythm with a noisy baseline, low voltage with no significant ST segment or T wave changes.  ? ?Patient was placed on furosemide for diuresis.  ?Overnight on second hospital day, developed hypotension, so was transferred to ICU.  Suspect this was diuresis plus medications with vasodilatory and anti-inotropic effects including opiates, seroquel and gabapentin.   ? ?Patient was placed on norepinephrine for hypotension with good toleration. ?Patient was transitioned to midordrine.  ?04/19 Right heart catheterization PCW 9, cardiac output 5,7 and index 2,9. (Fick).  ? ?Transfer to [facility] on 04/20  ? ?Patient with improved volume status, she will continue diuresis with torsemide and spironolactone. ?Follow up as outpatient.  ? ? ?Assessment and Plan: ?* Acute on chronic congestive heart failure ([place]) ?Patient was admitted to the cardiac ward and she was placed on furosemide IV for diuresis.  ?She developed hypotension and required transfer to the intensive care unit and placed on vasopressors.  ? ?Patient was placed on norepinephrine drip and furosemide drip.  ? ?04/17 discontinued norepinephrine.  ?04/19 discontinue furosemide drip.  ? ?Echocardiogram with preserved LV systolic function with  EF 65 to 70%, preserved RV systolic function, moderate to severe tricuspid valve regurgitation.  ? ?Cardiac catheterization with PCWP of 9 with cardiac output 5.7 and index 2.9 (Fick).  ?PA mean 18   ? ?At the time of her discharge she has a negative fluid balance of 15,082 ml with significant improvement in her symptoms. ? ?Patient will continue heart failure medical therapy with midodrine, torsemide and spironolactone.  ?Holding on ARB/ Entresto and B blockade due to risk of hypotension.   ?Follow up as outpatient for further adjustments in medical therapy.  ? ? ? ?Acute on chronic respiratory failure with hypoxia and hypercapnia (HCC) ?Acute hypoxemia due to acute cardiogenic pulmonary edema.  ?Chronic OSA and OHS.  ?Patient has tolerated well diuresis, with improvement in oxygenation. ? ?At the time of her discharge her 02 saturation is 97 to 98% on room air. ?Continue diuresis as outpatient.  ? ?History of pulmonary embolism ?Continue anticoagulation with warfarin.  ?INR today is 1.6. ?Target INR  2 to 3.   ? ?AKI (acute kidney injury) ([place]) ?Hypokalemia, hypocalcemia  ? ?Negative fluid balance was achieved with improvement in renal function, electrolytes were corrected. ?At the time of her discharge her renal function has a serum cr of 0,79, K is 3,8 and serum bicarbonate ate 27. ? ?Plan to continue diuresis with torsemide and spironolactone and follow up renal function as outpatient.  ? ?Coronary artery disease involving native coronary artery of native heart without angina pectoris ?S/p CABG x4 10/27/2021.  She denies any chest pain.  Troponin minimally elevated and to rending down on repeat.  EKG without acute ischemic changes.   ? ?Patient ruled out for acute coronary syndrome, elevated troponin due to congested heart failure.  ?-Continue clopidogrel and rosuvastatin ? ?Dyslipidemia, continue with statin therapy.  ? ?Essential hypertension ?Continue blood pressure support with midodrine ? ?Hypothyroidism ?Continue home levothyroxine and liothyronine. ? ?Chronic pain disorder ?Depression.  ?Patient on bupropion, vortioxetine, duloxetine, carbamazepine and oxycodone with good toleration.  ? ?Acute  metabolic encephalopathy ?Patient with improved mentation ?Continue nutritional support and neuro checks per unit protocol ?Pt and Ot.  ? ?Anemia ?Anemia of chronic disease with iron deficiency.  ?At the time of her discharge her hgb is 11.9 with plt 450 (thrombocytosis). ?Follow up cell count as outpatient.  ?Continue with oral iron supplementation.  ? ?Malnutrition of moderate degree ?Obesity class 3 with calculated BMI 43 ?Continue with nutritional supplements.  ? ? ? ? ?  ? ? ?Consultants: cardiology  ?Procedures performed: right heart catheterization   ?Disposition: Skilled nursing facility ?Diet recommendation:  ?Cardiac diet ?DISCHARGE MEDICATION: ?Allergies as of 01/08/2022   ? ?   Reactions  ? Nsaids Nausea And Vomiting, Other (See Comments)  ? GI Upset (ibuprofen included)  ? Ibuprofen Other (See Comments)  ? Other reaction(s): Abdominal Pain  ? Tolmetin Other (See Comments)  ? Other reaction(s): Abdominal Pain  ? ?  ? ?  ?Medication List  ?  ? ?STOP taking these medications   ? ?enoxaparin 120 MG/0.8ML injection ?Commonly known as: LOVENOX ?  ?furosemide 40 MG tablet ?Commonly known as: LASIX ?  ?gabapentin 100 MG capsule ?Commonly known as: NEURONTIN ?  ?gabapentin 800 MG tablet ?Commonly known as: NEURONTIN ?  ?hydrOXYzine 50 MG tablet ?Commonly known as: ATARAX ?  ?metoprolol succinate 25 MG 24 hr tablet ?Commonly known as: TOPROL-XL ?  ?oxyCODONE-acetaminophen 10-325 MG tablet ?Commonly known as: PERCOCET ?Replaced by: oxyCODONE-acetaminophen 5-325 MG tablet ?  ? ?  ? ?  TAKE these medications   ? ?(feeding supplement) PROSource Plus liquid ?Take 30 mLs by mouth 2 (two) times daily between meals. ?  ?acetaminophen 325 MG tablet ?Commonly known as: TYLENOL ?Take 2 tablets (650 mg total) by mouth every 6 (six) hours as needed for mild pain or moderate pain. ?  ?anastrozole 1 MG tablet ?Commonly known as: ARIMIDEX ?TAKE 1 TABLET BY MOUTH  DAILY ?What changed:  ?how much to take ?how to take this ?when to  take this ?additional instructions ?  ?B COMPLEX 100 PO ?Take 1 tablet by mouth daily. ?  ?Biotin 10000 MCG Tabs ?Take 10,000 mcg by mouth daily. ?  ?buPROPion 300 MG 24 hr tablet ?Commonly known as: WELLBUTRIN XL ?

## 2022-01-08 NOTE — TOC Transition Note (Signed)
Transition of Care (TOC) - CM/SW Discharge Note ? ? ?Patient Details  ?Name: Deborah Cooke ?MRN: [id number] ?Date of Birth: 12/04/1950 ? ?Transition of Care (TOC) CM/SW Contact:  ?Antwan Bribiesca, LCSW ?Phone Number: ?01/08/2022, 1:32 PM ? ? ?Clinical Narrative:    ?Patient will DC to: [facility]   ?Anticipated DC date: 01/08/22 ?Family notified: Yes, son ?Transport by: Corey Harold ? ? ?Per MD patient ready for DC to . RN to call report prior to discharge (Room#[id number] [phone]). RN, patient, patient's family, and facility notified of DC. Discharge Summary and FL2 sent to facility. DC packet on chart. Ambulance transport requested for patient.  ? ?CSW will sign off for now as social work intervention is no longer needed. Please consult [place] again if new needs arise. ?  ? ? ?Final next level of care: [place] ?Barriers to Discharge: No Barriers Identified ? ? ?Patient Goals and CMS Choice ?Patient states their goals for this hospitalization and ongoing recovery are:: wants to be safe at home ?CMS Medicare.gov Compare Post Acute Care list provided to:: Patient ?Choice offered to / list presented to : Patient, Adult Children ? ?Discharge Placement ?PASRR number recieved: 01/08/22 ?Existing PASRR number confirmed : 01/08/22          ?Patient chooses bed at: [facility] ?Patient to be transferred to facility by: [facility] ?Name of family member notified: Remo Lipps ?Patient and family notified of of transfer: 01/08/22 ? ?Discharge Plan and Services ?  ?  ?Post Acute Care Choice: [place]          ?  ?  ?  ?  ?  ?  ?  ?  ?  ?  ? ?Social Determinants of Health (SDOH) Interventions ?Housing Interventions: Intervention Not Indicated ?Transportation Interventions: Intervention Not Indicated ? ? ?Readmission Risk Interventions ?   ? View : No data to display.  ?  ?  ?  ? ? ? ?[place], MSW, LCSW ?[phone] ?Heart Failure Social Worker  ? ?

## 2022-01-08 NOTE — TOC Progression Note (Addendum)
Transition of Care (TOC) - Progression Note  ? ? ?Patient Details  ?Name: Deborah Cooke ?MRN: [id number] ?Date of Birth: 1950-10-25 ? ?Transition of Care (TOC) CM/SW Contact  ?Milia Warth, LCSW ?Phone Number: ?01/08/2022, 10:14 AM ? ?Clinical Narrative:    ?HF CSW spoke with S[facility] at [facility] [phone] and she reported that she can take Ms. Howerton at time of discharge and that she will need a COVID for her.  ?[employer] portal states insurance has been approved Blaine Hamper ID: [id number] for 01/08/22-01/12/22. CSW notified treatment team. ? ?CSW notified patient at bedside about the DC plan. ? ?CSW will continue to follow throughout discharge. ? ? ?Expected Discharge Plan: [place] ?Barriers to Discharge: Continued Medical Work up ? ?Expected Discharge Plan and Services ?Expected Discharge Plan: [place] ?  ?  ?Post Acute Care Choice: [place] ?Living arrangements for the past 2 months: [place] ?                ?  ?  ?  ?  ?  ?  ?  ?  ?  ?  ? ? ?Social Determinants of Health (SDOH) Interventions ?Housing Interventions: Intervention Not Indicated ?Transportation Interventions: Intervention Not Indicated ? ?Readmission Risk Interventions ?   ? View : No data to display.  ?  ?  ?  ? ?[place], MSW, LCSW ?[phone] ?Heart Failure Social Worker  ? ?

## 2022-01-14 ENCOUNTER — Telehealth: Payer: Self-pay

## 2022-01-14 NOTE — Telephone Encounter (Signed)
Pt discharged from hospital on 01/08/22 to [facility]. Confirmed with Abner Greenspan, LPN at facility that [facility] will be managed there.  ?

## 2022-01-19 MED FILL — Medication: Qty: 1 | Status: AC

## 2022-01-20 ENCOUNTER — Encounter (HOSPITAL_COMMUNITY): Payer: Medicare Other

## 2022-01-21 ENCOUNTER — Ambulatory Visit (INDEPENDENT_AMBULATORY_CARE_PROVIDER_SITE_OTHER): Payer: Medicare Other | Admitting: *Deleted

## 2022-01-21 DIAGNOSIS — Z5181 Encounter for therapeutic drug level monitoring: Secondary | ICD-10-CM | POA: Diagnosis not present

## 2022-01-21 LAB — POCT INR: INR: 2.9 (ref 2.0–3.0)

## 2022-01-28 ENCOUNTER — Ambulatory Visit (INDEPENDENT_AMBULATORY_CARE_PROVIDER_SITE_OTHER): Payer: Medicare Other | Admitting: *Deleted

## 2022-01-28 DIAGNOSIS — Z5181 Encounter for therapeutic drug level monitoring: Secondary | ICD-10-CM

## 2022-01-28 DIAGNOSIS — Z86711 Personal history of pulmonary embolism: Secondary | ICD-10-CM

## 2022-01-28 LAB — POCT INR: INR: 2.5 (ref 2.0–3.0)

## 2022-01-28 NOTE — Patient Instructions (Signed)
Description   ?Spoke with Lattie Haw Nurse with [facility] and pt, advised to continue taking Warfarin 1 tablet ('6mg'$ ) daily. Recheck INR in 1 week.  Anticoagulation Clinic [phone] ?Drinks boost every other day and 1 salad a week.  ?  ?  ?

## 2022-01-29 ENCOUNTER — Other Ambulatory Visit: Payer: Self-pay | Admitting: Cardiothoracic Surgery

## 2022-01-29 ENCOUNTER — Other Ambulatory Visit: Payer: Self-pay | Admitting: Thoracic Surgery (Cardiothoracic Vascular Surgery)

## 2022-01-29 DIAGNOSIS — Z951 Presence of aortocoronary bypass graft: Secondary | ICD-10-CM

## 2022-02-01 ENCOUNTER — Ambulatory Visit
Admission: RE | Admit: 2022-02-01 | Discharge: 2022-02-01 | Disposition: A | Discharge status: Home / Self Care | Payer: Medicare Other | Source: Ambulatory Visit | Attending: Cardiothoracic Surgery | Admitting: Cardiothoracic Surgery

## 2022-02-01 ENCOUNTER — Other Ambulatory Visit: Payer: Self-pay | Admitting: Cardiothoracic Surgery

## 2022-02-01 ENCOUNTER — Ambulatory Visit (INDEPENDENT_AMBULATORY_CARE_PROVIDER_SITE_OTHER): Payer: Medicare Other | Admitting: Cardiothoracic Surgery

## 2022-02-01 VITALS — BP 144/88 | HR 108 | Resp 20 | Ht 59.0 in | Wt 204.0 lb

## 2022-02-01 DIAGNOSIS — Z951 Presence of aortocoronary bypass graft: Secondary | ICD-10-CM

## 2022-02-01 MED ORDER — CLOPIDOGREL BISULFATE 75 MG PO TABS: 75.0000 mg | ORAL_TABLET | Freq: Every day | ORAL | 3 refills | Status: DC

## 2022-02-01 MED ORDER — MIDODRINE HCL 2.5 MG PO TABS: 2.5000 mg | ORAL_TABLET | Freq: Three times a day (TID) | ORAL | 0 refills | Status: AC

## 2022-02-01 NOTE — Progress Notes (Signed)
HPI: ?Patient presents for final 23-monthpostop check after multivessel CABG in February for acute MI and severe three-vessel coronary disease.  She is now back at home after being hospitalized for cor pulmonale and fluid retention.  She is walking without shortness of breath or chest pain.  Unna boots have been removed from her large legs. ?She is currently on Plavix indefinitely for cryopreserved vein grafts.  She had 1 usable native vein graft which was placed to the LAD system. ? ?Last echo showed good biventricular function, moderate TR.  She is in sinus rhythm.  She is taking Coumadin for postop segmental pulmonary emboli which occurred following discharge from the hospital. ? ?Current Outpatient Medications  ?Medication Sig Dispense Refill  ? anastrozole (ARIMIDEX) 1 MG tablet TAKE 1 TABLET BY MOUTH  DAILY (Patient taking differently: Take 1 mg by mouth daily.) 90 tablet 3  ? B Complex Vitamins (B COMPLEX 100 PO) Take 1 tablet by mouth daily.    ? Biotin 10000 MCG TABS Take 10,000 mcg by mouth daily.    ? buPROPion (WELLBUTRIN XL) 300 MG 24 hr tablet Take 300 mg by mouth daily.    ? Calcium Carbonate (CALCIUM 600 PO) Take 600 mg by mouth in the morning and at bedtime.    ? carbamazepine (TEGRETOL XR) 200 MG 12 hr tablet Take 1 tablet (200 mg total) by mouth 2 (two) times daily. 60 tablet 41  ? cholecalciferol (VITAMIN D) 400 units TABS tablet Take 400 Units by mouth daily.    ? cyclobenzaprine (FLEXERIL) 10 MG tablet Take 1 tablet (10 mg total) by mouth 3 (three) times daily as needed for muscle spasms. 21 tablet 0  ? DULoxetine (CYMBALTA) 30 MG capsule Take 30 mg by mouth 2 (two) times daily.    ? ferrous sulfate 325 (65 FE) MG EC tablet Take 325 mg by mouth daily with breakfast.    ? latanoprost (XALATAN) 0.005 % ophthalmic solution Place 1 drop into both eyes at bedtime.    ? levothyroxine (SYNTHROID) 88 MCG tablet Take 88 mcg by mouth daily before breakfast. Also take liothyronine 25    ? liothyronine  (CYTOMEL) 25 MCG tablet Take 25 mcg by mouth daily. Also take levothyroxine 88    ? Multiple Vitamins-Iron (MULTIVITAMIN/IRON PO) Take 1 tablet by mouth daily.     ? Multiple Vitamins-Minerals (CERTAVITE/ANTIOXIDANTS) TABS Take 1 tablet by mouth 2 (two) times daily at 10 AM and 5 PM. 60 tablet 0  ? omeprazole (PRILOSEC) 20 MG capsule Take 20 mg by mouth every evening.    ? pantoprazole (PROTONIX) 40 MG tablet Take 1 tablet (40 mg total) by mouth daily.    ? polyethylene glycol (MIRALAX / GLYCOLAX) 17 g packet Take 17 g by mouth 2 (two) times daily. (Patient taking differently: Take 17 g by mouth daily.) 14 each 0  ? potassium chloride SA (KLOR-CON M) 20 MEQ tablet Take 1 tablet (20 mEq total) by mouth daily. 90 tablet 3  ? QUEtiapine (SEROQUEL) 300 MG tablet Take 0.5 tablets (150 mg total) by mouth at bedtime. 15 tablet 0  ? rosuvastatin (CRESTOR) 10 MG tablet Take 1 tablet (10 mg total) by mouth daily. 90 tablet 3  ? simethicone (MYLICON) 80 MG chewable tablet Chew 80 mg by mouth every 6 (six) hours as needed for flatulence.    ? spironolactone (ALDACTONE) 25 MG tablet Take 0.5 tablets (12.5 mg total) by mouth daily. 15 tablet 0  ? torsemide (DEMADEX) 20 MG tablet Take  2 tablets (40 mg total) by mouth 2 (two) times daily. 120 tablet 0  ? TRINTELLIX 20 MG TABS tablet Take 20 mg by mouth at bedtime.    ? warfarin (COUMADIN) 3 MG tablet Take 1 to 1.5 tablets by mouth once daily as directed by anticoagulation clinic (Patient taking differently: Take 3-4.5 mg by mouth See admin instructions. 4.5 mg Tuesday and Thursday ?3 mg Monday,Wednesday,Friday,Saturday and Sunday ? ?Patient states she is currently taking '6mg'$ ) 40 tablet 1  ? acetaminophen (TYLENOL) 325 MG tablet Take 2 tablets (650 mg total) by mouth every 6 (six) hours as needed for mild pain or moderate pain.    ? clopidogrel (PLAVIX) 75 MG tablet Take 1 tablet (75 mg total) by mouth daily. Do not stop unless directed by cardiology or cardiothoracic surgery. 90  tablet 3  ? midodrine (PROAMATINE) 2.5 MG tablet Take 1 tablet (2.5 mg total) by mouth 3 (three) times daily with meals. 90 tablet 0  ? Nutritional Supplements (,FEEDING SUPPLEMENT, PROSOURCE PLUS) liquid Take 30 mLs by mouth 2 (two) times daily between meals. 1800 mL 0  ? ?No current facility-administered medications for this visit.  ? ? ? ?Physical Exam: ? ?Blood pressure (!) 144/88, pulse (!) 108, resp. rate 20, height '4\' 11"'$  (1.499 m), weight 204 lb (92.5 kg), SpO2 98 %.  ? ?  ?   Exam ? ?  General- alert and comfortable sternal incision well-healed. ?   Neck- no JVD, no cervical adenopathy palpable, no carotid bruit ?  Lungs- clear without rales, wheezes ?  Cor- regular rate and rhythm, no murmur , gallop ?  Abdomen- soft, non-tender ?  Extremities - warm, non-tender, minimal edema ?  Neuro- oriented, appropriate, no focal weakness ? ?Diagnostic Tests: ?Chest x-ray performed today personally reviewed showing no pleural effusion.  Sternal wires intact.  Cardiac silhouette stable and small. ? ?Impression: ?Patient now doing well 3 months after emergency CABG.  Sternal precautions are now lifted.  She will be followed by the cardiology heart failure clinic and return here as needed.  She understands she should stay on Plavix indefinitely because of the cryopreserved vein conduit used to her circumflex and RCA. ? ?Plan: ?Return as needed. ? ? ?Dahlia Byes, MD ?[facility] ?[phone] ? ? ? ? ? ?

## 2022-02-04 ENCOUNTER — Ambulatory Visit (INDEPENDENT_AMBULATORY_CARE_PROVIDER_SITE_OTHER): Payer: Medicare Other

## 2022-02-04 DIAGNOSIS — Z86711 Personal history of pulmonary embolism: Secondary | ICD-10-CM | POA: Diagnosis not present

## 2022-02-04 DIAGNOSIS — Z5181 Encounter for therapeutic drug level monitoring: Secondary | ICD-10-CM

## 2022-02-04 LAB — POCT INR: INR: 1.3 — AB (ref 2.0–3.0)

## 2022-02-04 NOTE — Progress Notes (Signed)
Cardiology Office Note   Date:  02/05/2022   ID:  Deborah Cooke, DOB 10/28/1950, MRN [id number]  PCP:  Rich Fuchs, PA    Chief Complaint  Patient presents with   Follow-up   CAD  Wt Readings from Last 3 Encounters:  02/05/22 205 lb (93 kg)  02/01/22 204 lb (92.5 kg)  01/08/22 211 lb 10.3 oz (96 kg)       History of Present Illness: Deborah Cooke is a 71 y.o. female  with CAD, inferior STEMI in 10/2021 just days after shoulder surgery.  At the time of catheterization, she was in the sling. Had subtotal RCA occlusion with severe left main disease. IABP placed and patient had CABG.   Records show: "Inf STEMI in Feb 2023 c/b CHB - req'd IABP, temp pacer  s/p CABG (S-LAD, S-PDA, S-OM1/OM2) Cryopreserved allograft used for PDA and OM1/OM2 - needs Plavix indef Intraop VF >> defib Post op AFib >> Amiodarone  Post op Ileus Pulmonary embolism  Pulmonary hypertension  CT 3/23: main PA 45 mm Echocardiogram 2/23: RVSP 44 R sided congestive heart failure / Cor Pulmonale  Hypertension  Hyperlipidemia  Hypothyroidism  L breast CA GERD Migraine HA (Rx w Carbamazepine)   Prior CV Studies: Echocardiogram 10/27/21 EF 60-65, no RWMA, Gr 2 DD, D shaped LV c/w RV vol overload, findings c/w acute cor pulmonale, mod reduced RVSF, mildly elevated PASP, RVSP 44, mod BAE, severe MAC, mean MV 3 mmHg, mild dilation of ascending aorta (39 mm)"  SHe was discharged to rehab and went home in late March 2023.   Had PE in march and several episodes of congestive heart failure requiring hospitalization in 3/23 and 4/23.   Coumadin and Plavix currently.  No bleeding issues.   Denies : Chest pain. Dizziness. Leg edema. Nitroglycerin use. Orthopnea. Palpitations. Paroxysmal nocturnal dyspnea. Shortness of breath. Syncope.      Past Medical History:  Diagnosis Date   Adverse effect of anesthetic 01/2012   difficulty waking up.   Adverse effect of unspecified anesthetic, initial encounter  08/2011   low o2 sats.   Anemia    Anxiety    Arthritis    Breast cancer ([place]) 02/16/2017   left breast   Chronic cor pulmonale ([facility]) 12/14/2021   Echocardiogram 10/27/21 EF 60-65, no RWMA, Gr 2 DD, D shaped LV c/w RV vol overload, findings c/w acute cor pulmonale, mod reduced RVSF, mildly elevated PASP, RVSP 44, mod BAE, severe MAC, mean MV 3 mmHg, mild dilation of ascending aorta (39 mm)   Chronic pain    Closed fracture of left distal femur ([place]) 10/14/2020   Closed supracondylar fracture of right femur ([place]) 09/18/2012   Depression    Family history of breast cancer    GERD (gastroesophageal reflux disease)    pt denies   Headache(784.0)    Hypertension    Hypothyroidism    Opioid dependence in controlled environment ([place])    Osteoporosis    Osteoporosis with fracture 09/19/2012   Pneumonia    Thyroid disease     Past Surgical History:  Procedure Laterality Date    OPEN REDUCTION INTERNAL FIXATION (ORIF) DISTAL FEMUR FRACTURE (Left Leg Upper)  10/15/2020   BACK SURGERY     fusion mid back, lumbar and thoracic   BREAST BIOPSY Left 02/16/2017   BREAST LUMPECTOMY WITH RADIOACTIVE SEED AND SENTINEL LYMPH NODE BIOPSY Left 03/15/2017   Procedure: LEFT BREAST LUMPECTOMY WITH RADIOACTIVE SEED AND LEFT AXILLARY SENTINEL LYMPH NODE  BIOPSY;  Surgeon: [name] Overall, MD;  Location: [facility];  Service: General;  Laterality: Left;   CHOLECYSTECTOMY  10/1975   CORONARY ARTERY BYPASS GRAFT N/A 10/27/2021   Procedure: CORONARY ARTERY BYPASS GRAFTING (CABG) TIMES FOUR, USING LEFT INTERNAL MAMMARY ARTERY AND RIGHT LEG GREATER SAPHENOUS VEIN HARVESTED ENDOSCOPICALLY AND RIGHT CORONARY THROMBECTOMY;  Surgeon: Dahlia Byes, MD;  Location: [facility];  Service: Open Heart Surgery;  Laterality: N/A;   CORONARY/GRAFT ACUTE MI REVASCULARIZATION N/A 10/26/2021   Procedure: Coronary/Graft Acute MI Revascularization;  Surgeon: Jettie Booze, MD;  Location: [facility];  Service:  Cardiovascular;  Laterality: N/A;   ENDOVEIN HARVEST OF GREATER SAPHENOUS VEIN Right 10/27/2021   Procedure: ENDOVEIN HARVEST OF GREATER SAPHENOUS VEIN;  Surgeon: Dahlia Byes, MD;  Location: [facility];  Service: Open Heart Surgery;  Laterality: Right;   FEMUR IM NAIL  09/18/2012   Procedure: INTRAMEDULLARY (IM) RETROGRADE FEMORAL NAILING;  Surgeon: [name], MD;  Location: [facility];  Service: Orthopedics;  Laterality: Right;   GASTRIC BYPASS  2003   IABP INSERTION N/A 10/26/2021   Procedure: IABP Insertion;  Surgeon: Jettie Booze, MD;  Location: [facility];  Service: Cardiovascular;  Laterality: N/A;   JOINT REPLACEMENT     LEFT HEART CATH AND CORONARY ANGIOGRAPHY N/A 10/26/2021   Procedure: LEFT HEART CATH AND CORONARY ANGIOGRAPHY;  Surgeon: Jettie Booze, MD;  Location: [facility];  Service: Cardiovascular;  Laterality: N/A;   NECK SURGERY  2000   ORIF FEMUR FRACTURE Left 10/15/2020   Procedure: OPEN REDUCTION INTERNAL FIXATION (ORIF) DISTAL FEMUR FRACTURE;  Surgeon: Shona Needles, MD;  Location: [facility];  Service: Orthopedics;  Laterality: Left;   REPLACEMENT TOTAL KNEE  2003   right and left -4 months aoart.   REVERSE SHOULDER ARTHROPLASTY Right 10/20/2021   Procedure: REVERSE SHOULDER ARTHROPLASTY;  Surgeon: Marchia Bond, MD;  Location: [facility];  Service: Orthopedics;  Laterality: Right;   RIGHT HEART CATH N/A 01/06/2022   Procedure: RIGHT HEART CATH;  Surgeon: Jolaine Artist, MD;  Location: [facility];  Service: Cardiovascular;  Laterality: N/A;   TEE WITHOUT CARDIOVERSION N/A 10/27/2021   Procedure: TRANSESOPHAGEAL ECHOCARDIOGRAM (TEE);  Surgeon: Dahlia Byes, MD;  Location: [facility];  Service: Open Heart Surgery;  Laterality: N/A;   TEMPORARY PACEMAKER N/A 10/26/2021   Procedure: TEMPORARY PACEMAKER;  Surgeon: Jettie Booze, MD;  Location: [facility];  Service: Cardiovascular;  Laterality: N/A;   TONSILLECTOMY     WRIST FRACTURE  SURGERY Bilateral 08/2011     Current Outpatient Medications  Medication Sig Dispense Refill   anastrozole (ARIMIDEX) 1 MG tablet TAKE 1 TABLET BY MOUTH  DAILY (Patient taking differently: Take 1 mg by mouth daily.) 90 tablet 3   B Complex Vitamins (B COMPLEX 100 PO) Take 1 tablet by mouth daily.     Biotin 10000 MCG TABS Take 10,000 mcg by mouth daily.     buPROPion (WELLBUTRIN XL) 300 MG 24 hr tablet Take 300 mg by mouth daily.     Calcium Carbonate (CALCIUM 600 PO) Take 600 mg by mouth in the morning and at bedtime.     carbamazepine (TEGRETOL XR) 400 MG 12 hr tablet Take 400 mg by mouth in the morning and at bedtime.     cholecalciferol (VITAMIN D) 400 units TABS tablet Take 400 Units by mouth daily.     clopidogrel (PLAVIX) 75 MG tablet Take 1 tablet (75 mg total)  by mouth daily. Do not stop unless directed by cardiology or cardiothoracic surgery. 90 tablet 3   cyclobenzaprine (FLEXERIL) 10 MG tablet Take 1 tablet (10 mg total) by mouth 3 (three) times daily as needed for muscle spasms. 21 tablet 0   DULoxetine (CYMBALTA) 60 MG capsule Take 60 mg by mouth 2 (two) times daily.     ferrous sulfate 325 (65 FE) MG EC tablet Take 325 mg by mouth daily with breakfast.     hydrOXYzine (ATARAX) 50 MG tablet Take 1 tablet by mouth 3 (three) times daily as needed.     latanoprost (XALATAN) 0.005 % ophthalmic solution Place 1 drop into both eyes at bedtime.     levothyroxine (SYNTHROID) 88 MCG tablet Take 88 mcg by mouth daily before breakfast. Also take liothyronine 25     liothyronine (CYTOMEL) 25 MCG tablet Take 25 mcg by mouth daily. Also take levothyroxine 88     midodrine (PROAMATINE) 2.5 MG tablet Take 1 tablet (2.5 mg total) by mouth 3 (three) times daily with meals. 90 tablet 0   Multiple Vitamins-Iron (MULTIVITAMIN/IRON PO) Take 1 tablet by mouth daily.      omeprazole (PRILOSEC) 20 MG capsule Take 20 mg by mouth every evening.     pantoprazole (PROTONIX) 40 MG tablet Take 1 tablet (40 mg  total) by mouth daily.     polyethylene glycol (MIRALAX / GLYCOLAX) 17 g packet Take 17 g by mouth 2 (two) times daily. (Patient taking differently: Take 17 g by mouth daily.) 14 each 0   potassium chloride SA (KLOR-CON M) 20 MEQ tablet Take 1 tablet (20 mEq total) by mouth daily. 90 tablet 3   QUEtiapine (SEROQUEL) 300 MG tablet Take 0.5 tablets (150 mg total) by mouth at bedtime. 15 tablet 0   QUEtiapine (SEROQUEL) 400 MG tablet Take 1 tablet by mouth daily.     rosuvastatin (CRESTOR) 10 MG tablet Take 1 tablet (10 mg total) by mouth daily. 90 tablet 3   simethicone (MYLICON) 80 MG chewable tablet Chew 80 mg by mouth every 6 (six) hours as needed for flatulence.     spironolactone (ALDACTONE) 25 MG tablet Take 0.5 tablets (12.5 mg total) by mouth daily. 15 tablet 0   torsemide (DEMADEX) 20 MG tablet Take 2 tablets (40 mg total) by mouth 2 (two) times daily. 120 tablet 0   TRINTELLIX 20 MG TABS tablet Take 20 mg by mouth at bedtime.     warfarin (COUMADIN) 6 MG tablet Take as directed     No current facility-administered medications for this visit.    Allergies:   Nsaids, Ibuprofen, and Tolmetin    Social History:  The patient  reports that she quit smoking about 33 years ago. Her smoking use included cigarettes. She has a 28.00 pack-year smoking history. She quit smokeless tobacco use about 33 years ago. She reports current alcohol use. She reports that she does not use drugs.   Family History:  The patient's family history includes Breast cancer (age of onset: 55) in her mother; Cancer (age of onset: 70) in her father.    ROS:  Please see the history of present illness.   Otherwise, review of systems are positive for limited right arm mobility.   All other systems are reviewed and negative.    PHYSICAL EXAM: VS:  BP 110/70   Pulse 91   Ht _0  (1.499 m)   Wt 205 lb (93 kg)   SpO2 98%   BMI 41.40 kg/m  ,  BMI Body mass index is 41.4 kg/m. GEN: Well nourished, well developed, in  no acute distress HEENT: normal Neck: no JVD, carotid bruits, or masses Cardiac: RRR; no murmurs, rubs, or gallops,no edema  Respiratory:  clear to auscultation bilaterally, normal work of breathing GI: soft, nontender, nondistended, + BS MS: no deformity or atrophy Skin: warm and dry, no rash Neuro:  Strength and sensation are intact Psych: euthymic mood, full affect     Recent Labs: 01/02/2022: ALT 14; B Natriuretic Peptide 405.8 01/05/2022: TSH 9.721 01/08/2022: BUN 24; Creatinine, Ser 0.79; Hemoglobin 11.9; Magnesium 2.6; Platelets 450; Potassium 3.8; Sodium 138   Lipid Panel    Component Value Date/Time   CHOL 154 10/26/2021 2240   TRIG 67 11/01/2021 0357   HDL 55 10/26/2021 2240   CHOLHDL 2.8 10/26/2021 2240   VLDL 29 10/26/2021 2240   LDLCALC 70 10/26/2021 2240     Other studies Reviewed: Additional studies/ records that were reviewed today with results demonstrating: labs reviewed- stable Cr.  LDL 70 at the time of MI.     ASSESSMENT AND PLAN:  CAD: No angina. Continue aggressive secondary prevention. High intensity statin will beneficial.  AFib: post op. Was on Amio but this was stopped.  HTN: The current medical regimen is effective;  continue present plan and medications. Hyperlipidemia: Increase Crestor to 20 mg daily check liver and lipids in 2 to 3 months. H/o PE: diagnosed in 3/23. Tolerating Coumadin.  Likely from the post-surgical setting, but given the extensive RCA thrombus, I do wonder if she had some type of hypercoagulable state.  Plan for lifelong Plavix.  WOuld also consider hematology w/u.    Current medicines are reviewed at length with the patient today.  The patient concerns regarding her medicines were addressed.  The following changes have been made:  No change  Labs/ tests ordered today include:  No orders of the defined types were placed in this encounter.   Recommend 150 minutes/week of aerobic exercise Low fat, low carb, high fiber  diet recommended  Disposition:   FU in 6 months   Signed, Larae Grooms, MD  02/05/2022 9:43 AM    [facility] [address], [place], [place]  [address] Phone: [phone]; Fax: [phone]

## 2022-02-04 NOTE — Patient Instructions (Signed)
Description   Spoke with Lattie Haw, Nurse with [facility] and pt, advised to take 2 tablets today and 1.5 tablets tomorrow and then continue taking Warfarin 1 tablet ('6mg'$ ) daily.  Recheck INR in 1 week.   Anticoagulation Clinic [phone] Drinks boost every other day and 1 salad a week.  02/04/22-pt may be d/c next week & need in office appts-made aware over the phone

## 2022-02-05 ENCOUNTER — Ambulatory Visit (INDEPENDENT_AMBULATORY_CARE_PROVIDER_SITE_OTHER): Payer: Medicare Other | Admitting: Interventional Cardiology

## 2022-02-05 ENCOUNTER — Encounter: Payer: Self-pay | Admitting: Interventional Cardiology

## 2022-02-05 VITALS — BP 110/70 | HR 91 | Ht 59.0 in | Wt 205.0 lb

## 2022-02-05 DIAGNOSIS — E785 Hyperlipidemia, unspecified: Secondary | ICD-10-CM | POA: Diagnosis not present

## 2022-02-05 DIAGNOSIS — I2510 Atherosclerotic heart disease of native coronary artery without angina pectoris: Secondary | ICD-10-CM | POA: Diagnosis not present

## 2022-02-05 DIAGNOSIS — I4891 Unspecified atrial fibrillation: Secondary | ICD-10-CM

## 2022-02-05 DIAGNOSIS — I10 Essential (primary) hypertension: Secondary | ICD-10-CM | POA: Diagnosis not present

## 2022-02-05 DIAGNOSIS — I9789 Other postprocedural complications and disorders of the circulatory system, not elsewhere classified: Secondary | ICD-10-CM

## 2022-02-05 MED ORDER — ROSUVASTATIN CALCIUM 20 MG PO TABS: 20.0000 mg | ORAL_TABLET | Freq: Every day | ORAL | 3 refills | Status: DC

## 2022-02-05 NOTE — Patient Instructions (Signed)
Medication Instructions:  Your physician has recommended you make the following change in your medication: Increase Rosuvastatin to 20 mg by mouth daily  *If you need a refill on your cardiac medications before your next appointment, please call your pharmacy*   Lab Work: Your physician recommends that you return for lab work on April 21, 2022.  Lipid,liver, CBC.  This is fasting.  The lab opens at 7:15  If you have labs (blood work) drawn today and your tests are completely normal, you will receive your results only by: [place] (if you have MyChart) OR A paper copy in the mail If you have any lab test that is abnormal or we need to change your treatment, we will call you to review the results.   Testing/Procedures: none   Follow-Up: At [facility], you and your health needs are our priority.  As part of our continuing mission to provide you with exceptional heart care, we have created designated Provider Care Teams.  These Care Teams include your primary Cardiologist (physician) and Advanced Practice Providers (APPs -  Physician Assistants and Nurse Practitioners) who all work together to provide you with the care you need, when you need it.  We recommend signing up for the patient portal called "MyChart".  Sign up information is provided on this After Visit Summary.  MyChart is used to connect with patients for Virtual Visits (Telemedicine).  Patients are able to view lab/test results, encounter notes, upcoming appointments, etc.  Non-urgent messages can be sent to your provider as well.   To learn more about what you can do with MyChart, go to [email or web address].    Your next appointment:   August 23, 2022 at 11:00  The format for your next appointment:   In Person  Provider:   Larae Grooms, MD     Other Instructions    Important Information About Sugar

## 2022-02-08 ENCOUNTER — Other Ambulatory Visit (HOSPITAL_COMMUNITY): Payer: Self-pay

## 2022-02-11 ENCOUNTER — Ambulatory Visit (INDEPENDENT_AMBULATORY_CARE_PROVIDER_SITE_OTHER): Payer: Medicare Other

## 2022-02-11 DIAGNOSIS — Z86711 Personal history of pulmonary embolism: Secondary | ICD-10-CM | POA: Diagnosis not present

## 2022-02-11 DIAGNOSIS — Z5181 Encounter for therapeutic drug level monitoring: Secondary | ICD-10-CM | POA: Diagnosis not present

## 2022-02-11 LAB — POCT INR: INR: 2.3 (ref 2.0–3.0)

## 2022-02-11 MED ORDER — WARFARIN SODIUM 6 MG PO TABS: ORAL_TABLET | ORAL | 0 refills | Status: DC

## 2022-02-11 NOTE — Patient Instructions (Signed)
Description   Spoke with Lattie Haw, Nurse with [facility] and pt, advised to continue taking Warfarin 1 tablet ('6mg'$ ) daily. Recheck INR in 1 week in [facility] office.  Anticoagulation Clinic [phone] boost every other day and 1 salad a week. Home Health discharging pt today, made appt in office for next week in [facility] office.

## 2022-02-18 ENCOUNTER — Ambulatory Visit (INDEPENDENT_AMBULATORY_CARE_PROVIDER_SITE_OTHER): Payer: Medicare Other

## 2022-02-18 DIAGNOSIS — Z5181 Encounter for therapeutic drug level monitoring: Secondary | ICD-10-CM

## 2022-02-18 DIAGNOSIS — Z86711 Personal history of pulmonary embolism: Secondary | ICD-10-CM | POA: Diagnosis not present

## 2022-02-18 LAB — POCT INR: INR: 1.7 — AB (ref 2.0–3.0)

## 2022-02-18 NOTE — Patient Instructions (Signed)
TAKE 2 TABLETS TODAY ONLY and then continue Warfarin 1 tablet ('6mg'$ ) daily. Recheck INR in 2 weeks.  Anticoagulation Clinic [phone]. Drinks boost every other day and 1 salad a week.

## 2022-03-04 ENCOUNTER — Other Ambulatory Visit (HOSPITAL_COMMUNITY): Payer: Self-pay

## 2022-03-04 ENCOUNTER — Ambulatory Visit (INDEPENDENT_AMBULATORY_CARE_PROVIDER_SITE_OTHER): Payer: Medicare Other

## 2022-03-04 DIAGNOSIS — Z5181 Encounter for therapeutic drug level monitoring: Secondary | ICD-10-CM

## 2022-03-04 DIAGNOSIS — Z86711 Personal history of pulmonary embolism: Secondary | ICD-10-CM

## 2022-03-04 LAB — POCT INR: INR: 1.9 — AB (ref 2.0–3.0)

## 2022-03-04 NOTE — Patient Instructions (Signed)
Description   START taking  Warfarin 1 tablet ('6mg'$ ) daily EXCEPT 1.5 tablets on Thursdays. Recheck INR in 2 weeks.   Drinks boost every other day and 1 salad a week.  Anticoagulation Clinic [phone].

## 2022-03-09 ENCOUNTER — Other Ambulatory Visit: Payer: Self-pay | Admitting: Cardiothoracic Surgery

## 2022-03-10 ENCOUNTER — Other Ambulatory Visit: Payer: Self-pay | Admitting: Cardiothoracic Surgery

## 2022-03-10 ENCOUNTER — Encounter: Payer: Self-pay | Admitting: Interventional Cardiology

## 2022-03-10 MED ORDER — MIDODRINE HCL 2.5 MG PO TABS: 2.5000 mg | ORAL_TABLET | Freq: Three times a day (TID) | ORAL | 2 refills | Status: DC

## 2022-03-10 NOTE — Telephone Encounter (Signed)
I spoke with [facility] at [address] and they will send refill request to our office.  Once request received it is OK to refill.

## 2022-03-10 NOTE — Telephone Encounter (Signed)
Chart reviewed and patient was taking midodrine 2.5 mg by mouth 3 times daily with meals when she saw Dr Irish Lack on 02/05/22.  I spoke with [facility] and confirmed this is dose patient is taking.  Refill sent to pharmacy

## 2022-03-16 ENCOUNTER — Other Ambulatory Visit (HOSPITAL_COMMUNITY): Payer: Self-pay

## 2022-03-18 ENCOUNTER — Ambulatory Visit (INDEPENDENT_AMBULATORY_CARE_PROVIDER_SITE_OTHER): Payer: Medicare Other

## 2022-03-18 DIAGNOSIS — Z5181 Encounter for therapeutic drug level monitoring: Secondary | ICD-10-CM

## 2022-03-18 DIAGNOSIS — Z86711 Personal history of pulmonary embolism: Secondary | ICD-10-CM

## 2022-03-18 LAB — POCT INR: INR: 2.2 (ref 2.0–3.0)

## 2022-03-18 NOTE — Patient Instructions (Signed)
Description   Continue taking  Warfarin 1 tablet ('6mg'$ ) daily EXCEPT 1.5 tablets on Thursdays. Recheck INR in 3 weeks.   Drinks boost (2-3 per week) and 1 salad a week.  Anticoagulation Clinic [phone].

## 2022-03-24 ENCOUNTER — Other Ambulatory Visit: Payer: Self-pay | Admitting: Interventional Cardiology

## 2022-03-24 DIAGNOSIS — I4891 Unspecified atrial fibrillation: Secondary | ICD-10-CM

## 2022-03-30 ENCOUNTER — Other Ambulatory Visit (HOSPITAL_COMMUNITY): Payer: Self-pay

## 2022-04-01 ENCOUNTER — Other Ambulatory Visit (HOSPITAL_COMMUNITY): Payer: Self-pay

## 2022-04-01 ENCOUNTER — Other Ambulatory Visit: Payer: Self-pay | Admitting: Interventional Cardiology

## 2022-04-01 MED FILL — Spironolactone Tab 25 MG: ORAL | 90 days supply | Qty: 45 | Fill #0 | Status: AC

## 2022-04-02 ENCOUNTER — Other Ambulatory Visit (HOSPITAL_COMMUNITY): Payer: Self-pay

## 2022-04-03 ENCOUNTER — Other Ambulatory Visit (HOSPITAL_COMMUNITY): Payer: Self-pay

## 2022-04-09 ENCOUNTER — Other Ambulatory Visit: Payer: Self-pay

## 2022-04-09 DIAGNOSIS — Z170 Estrogen receptor positive status [ER+]: Secondary | ICD-10-CM

## 2022-04-12 ENCOUNTER — Inpatient Hospital Stay: Payer: Medicare Other

## 2022-04-12 ENCOUNTER — Encounter: Payer: Self-pay | Admitting: Nurse Practitioner

## 2022-04-12 ENCOUNTER — Other Ambulatory Visit: Payer: Self-pay

## 2022-04-12 ENCOUNTER — Ambulatory Visit (INDEPENDENT_AMBULATORY_CARE_PROVIDER_SITE_OTHER): Payer: Medicare Other

## 2022-04-12 ENCOUNTER — Inpatient Hospital Stay: Payer: Medicare Other | Attending: Nurse Practitioner | Admitting: Nurse Practitioner

## 2022-04-12 VITALS — BP 135/85 | HR 88 | Temp 98.4°F | Resp 15 | Wt 201.2 lb

## 2022-04-12 DIAGNOSIS — Z170 Estrogen receptor positive status [ER+]: Secondary | ICD-10-CM | POA: Diagnosis not present

## 2022-04-12 DIAGNOSIS — E669 Obesity, unspecified: Secondary | ICD-10-CM | POA: Diagnosis not present

## 2022-04-12 DIAGNOSIS — Z5181 Encounter for therapeutic drug level monitoring: Secondary | ICD-10-CM

## 2022-04-12 DIAGNOSIS — Z86711 Personal history of pulmonary embolism: Secondary | ICD-10-CM | POA: Diagnosis not present

## 2022-04-12 DIAGNOSIS — Z9884 Bariatric surgery status: Secondary | ICD-10-CM | POA: Diagnosis not present

## 2022-04-12 DIAGNOSIS — I509 Heart failure, unspecified: Secondary | ICD-10-CM | POA: Insufficient documentation

## 2022-04-12 DIAGNOSIS — M8580 Other specified disorders of bone density and structure, unspecified site: Secondary | ICD-10-CM | POA: Insufficient documentation

## 2022-04-12 DIAGNOSIS — N6320 Unspecified lump in the left breast, unspecified quadrant: Secondary | ICD-10-CM | POA: Insufficient documentation

## 2022-04-12 DIAGNOSIS — Z9049 Acquired absence of other specified parts of digestive tract: Secondary | ICD-10-CM | POA: Insufficient documentation

## 2022-04-12 DIAGNOSIS — Z79890 Hormone replacement therapy (postmenopausal): Secondary | ICD-10-CM | POA: Insufficient documentation

## 2022-04-12 DIAGNOSIS — C50412 Malignant neoplasm of upper-outer quadrant of left female breast: Secondary | ICD-10-CM | POA: Insufficient documentation

## 2022-04-12 DIAGNOSIS — I110 Hypertensive heart disease with heart failure: Secondary | ICD-10-CM | POA: Insufficient documentation

## 2022-04-12 DIAGNOSIS — I252 Old myocardial infarction: Secondary | ICD-10-CM | POA: Diagnosis not present

## 2022-04-12 DIAGNOSIS — Z886 Allergy status to analgesic agent status: Secondary | ICD-10-CM | POA: Diagnosis not present

## 2022-04-12 DIAGNOSIS — Z7902 Long term (current) use of antithrombotics/antiplatelets: Secondary | ICD-10-CM | POA: Diagnosis not present

## 2022-04-12 DIAGNOSIS — Z79899 Other long term (current) drug therapy: Secondary | ICD-10-CM | POA: Insufficient documentation

## 2022-04-12 DIAGNOSIS — Z7901 Long term (current) use of anticoagulants: Secondary | ICD-10-CM | POA: Insufficient documentation

## 2022-04-12 DIAGNOSIS — F419 Anxiety disorder, unspecified: Secondary | ICD-10-CM | POA: Diagnosis not present

## 2022-04-12 DIAGNOSIS — F32A Depression, unspecified: Secondary | ICD-10-CM | POA: Diagnosis not present

## 2022-04-12 LAB — CBC WITH DIFFERENTIAL (CANCER CENTER ONLY)
Abs Immature Granulocytes: 0.01 10*3/uL (ref 0.00–0.07)
Basophils Absolute: 0 10*3/uL (ref 0.0–0.1)
Basophils Relative: 1 %
Eosinophils Absolute: 0.1 10*3/uL (ref 0.0–0.5)
Eosinophils Relative: 3 %
HCT: 37.4 % (ref 36.0–46.0)
Hemoglobin: 12.1 g/dL (ref 12.0–15.0)
Immature Granulocytes: 0 %
Lymphocytes Relative: 28 %
Lymphs Abs: 1.2 10*3/uL (ref 0.7–4.0)
MCH: 30.2 pg (ref 26.0–34.0)
MCHC: 32.4 g/dL (ref 30.0–36.0)
MCV: 93.3 fL (ref 80.0–100.0)
Monocytes Absolute: 0.5 10*3/uL (ref 0.1–1.0)
Monocytes Relative: 13 %
Neutro Abs: 2.3 10*3/uL (ref 1.7–7.7)
Neutrophils Relative %: 55 %
Platelet Count: 306 10*3/uL (ref 150–400)
RBC: 4.01 MIL/uL (ref 3.87–5.11)
RDW: 13.3 % (ref 11.5–15.5)
WBC Count: 4.1 10*3/uL (ref 4.0–10.5)
nRBC: 0 % (ref 0.0–0.2)

## 2022-04-12 LAB — IRON AND IRON BINDING CAPACITY (CC-WL,HP ONLY)
Iron: 95 ug/dL (ref 28–170)
Saturation Ratios: 36 % — ABNORMAL HIGH (ref 10.4–31.8)
TIBC: 265 ug/dL (ref 250–450)
UIBC: 170 ug/dL (ref 148–442)

## 2022-04-12 LAB — CMP (CANCER CENTER ONLY)
ALT: 14 U/L (ref 0–44)
AST: 18 U/L (ref 15–41)
Albumin: 4 g/dL (ref 3.5–5.0)
Alkaline Phosphatase: 125 U/L (ref 38–126)
Anion gap: 6 (ref 5–15)
BUN: 24 mg/dL — ABNORMAL HIGH (ref 8–23)
CO2: 32 mmol/L (ref 22–32)
Calcium: 9.1 mg/dL (ref 8.9–10.3)
Chloride: 104 mmol/L (ref 98–111)
Creatinine: 0.88 mg/dL (ref 0.44–1.00)
GFR, Estimated: >60 mL/min (ref >60)
Glucose, Bld: 99 mg/dL (ref 70–99)
Potassium: 4.3 mmol/L (ref 3.5–5.1)
Sodium: 142 mmol/L (ref 135–145)
Total Bilirubin: 0.3 mg/dL (ref 0.3–1.2)
Total Protein: 6.6 g/dL (ref 6.5–8.1)

## 2022-04-12 LAB — POCT INR: INR: 1.3 — AB (ref 2.0–3.0)

## 2022-04-12 LAB — FERRITIN: Ferritin: 72 ng/mL (ref 11–307)

## 2022-04-12 NOTE — Patient Instructions (Signed)
TAKE 2 TABLETS TODAY ONLY AND THEN Continue taking  Warfarin 1 tablet ('6mg'$ ) daily EXCEPT 1.5 tablets on Thursdays. Recheck INR in 2 weeks.     Anticoagulation Clinic [phone].
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
## 2022-04-21 ENCOUNTER — Other Ambulatory Visit: Payer: Medicare Other

## 2022-04-21 DIAGNOSIS — I2510 Atherosclerotic heart disease of native coronary artery without angina pectoris: Secondary | ICD-10-CM

## 2022-04-21 DIAGNOSIS — I4891 Unspecified atrial fibrillation: Secondary | ICD-10-CM

## 2022-04-21 DIAGNOSIS — E785 Hyperlipidemia, unspecified: Secondary | ICD-10-CM

## 2022-04-21 LAB — CBC
Hematocrit: 39.3 % (ref 34.0–46.6)
Hemoglobin: 12.9 g/dL (ref 11.1–15.9)
MCH: 30.4 pg (ref 26.6–33.0)
MCHC: 32.8 g/dL (ref 31.5–35.7)
MCV: 93 fL (ref 79–97)
Platelets: 298 10*3/uL (ref 150–450)
RBC: 4.24 x10E6/uL (ref 3.77–5.28)
RDW: 12.2 % (ref 11.7–15.4)
WBC: 5.3 10*3/uL (ref 3.4–10.8)

## 2022-04-21 LAB — LIPID PANEL
Chol/HDL Ratio: 2 ratio (ref 0.0–4.4)
Cholesterol, Total: 159 mg/dL (ref 100–199)
HDL: 80 mg/dL (ref >39)
LDL Chol Calc (NIH): 67 mg/dL (ref 0–99)
Triglycerides: 59 mg/dL (ref 0–149)
VLDL Cholesterol Cal: 12 mg/dL (ref 5–40)

## 2022-04-21 LAB — HEPATIC FUNCTION PANEL
ALT: 17 IU/L (ref 0–32)
AST: 20 IU/L (ref 0–40)
Albumin: 4.2 g/dL (ref 3.8–4.8)
Alkaline Phosphatase: 137 IU/L — ABNORMAL HIGH (ref 44–121)
Bilirubin Total: 0.3 mg/dL (ref 0.0–1.2)
Bilirubin, Direct: 0.1 mg/dL (ref 0.00–0.40)
Total Protein: 6.5 g/dL (ref 6.0–8.5)

## 2022-04-22 ENCOUNTER — Telehealth: Payer: Self-pay | Admitting: *Deleted

## 2022-04-22 DIAGNOSIS — E785 Hyperlipidemia, unspecified: Secondary | ICD-10-CM

## 2022-04-22 MED ORDER — ROSUVASTATIN CALCIUM 40 MG PO TABS: 40.0000 mg | ORAL_TABLET | Freq: Every day | ORAL | 3 refills | Status: DC

## 2022-04-22 NOTE — Telephone Encounter (Signed)
I spoke with patient and reviewed results with her.  Prescription sent to [facility] at [address].  Patient will come in for fasting lab work on July 23, 2022.

## 2022-04-22 NOTE — Telephone Encounter (Signed)
-----   Message from Jettie Booze, MD sent at 04/22/2022  8:36 AM EDT ----- Stable liver tests. Lipids ok, but would like to see LDL at 55. Normal CBC. Increase rosuvastatin to 40 mg daily.  Liver and lipids in 3 months

## 2022-04-27 ENCOUNTER — Ambulatory Visit (INDEPENDENT_AMBULATORY_CARE_PROVIDER_SITE_OTHER): Payer: Medicare Other

## 2022-04-27 DIAGNOSIS — Z86711 Personal history of pulmonary embolism: Secondary | ICD-10-CM

## 2022-04-27 DIAGNOSIS — Z5181 Encounter for therapeutic drug level monitoring: Secondary | ICD-10-CM

## 2022-04-27 LAB — POCT INR: INR: 1.2 — AB (ref 2.0–3.0)

## 2022-04-27 NOTE — Patient Instructions (Signed)
TAKE 2 TABLETS TODAY ONLY AND THEN INCREASE TO 1 tablet ('6mg'$ ) daily EXCEPT 1.5 tablets on MONDAY, WEDNESDAY and FRIDAY. Recheck INR in 2 weeks.     Anticoagulation Clinic [phone].

## 2022-05-10 ENCOUNTER — Ambulatory Visit (INDEPENDENT_AMBULATORY_CARE_PROVIDER_SITE_OTHER): Payer: Medicare Other

## 2022-05-10 DIAGNOSIS — Z86711 Personal history of pulmonary embolism: Secondary | ICD-10-CM | POA: Diagnosis not present

## 2022-05-10 DIAGNOSIS — Z5181 Encounter for therapeutic drug level monitoring: Secondary | ICD-10-CM | POA: Diagnosis not present

## 2022-05-10 LAB — POCT INR: INR: 1.9 — AB (ref 2.0–3.0)

## 2022-05-10 NOTE — Patient Instructions (Signed)
Description   TAKE 2 TABLETS TODAY and then continue taking 1 tablet ('6mg'$ ) daily EXCEPT 1.5 tablets on MONDAY, WEDNESDAY and FRIDAY.  Recheck INR in 2 weeks.     Anticoagulation Clinic [phone].

## 2022-05-25 ENCOUNTER — Ambulatory Visit: Payer: Medicare Other | Attending: Interventional Cardiology

## 2022-05-25 DIAGNOSIS — Z86711 Personal history of pulmonary embolism: Secondary | ICD-10-CM

## 2022-05-25 DIAGNOSIS — Z5181 Encounter for therapeutic drug level monitoring: Secondary | ICD-10-CM

## 2022-05-25 LAB — POCT INR: INR: 1.1 — AB (ref 2.0–3.0)

## 2022-05-25 NOTE — Patient Instructions (Signed)
Description   Take '12mg'$  today, then start taking '9mg'$  daily except '6mg'$  on Sundays and Thursdays.    Recheck INR in 1 week.     Anticoagulation Clinic [phone].

## 2022-06-01 ENCOUNTER — Ambulatory Visit: Payer: Medicare Other | Attending: Cardiology

## 2022-06-01 DIAGNOSIS — Z5181 Encounter for therapeutic drug level monitoring: Secondary | ICD-10-CM | POA: Diagnosis not present

## 2022-06-01 DIAGNOSIS — Z86711 Personal history of pulmonary embolism: Secondary | ICD-10-CM | POA: Diagnosis not present

## 2022-06-01 DIAGNOSIS — I4891 Unspecified atrial fibrillation: Secondary | ICD-10-CM

## 2022-06-01 LAB — POCT INR: INR: 1.7 — AB (ref 2.0–3.0)

## 2022-06-01 MED ORDER — WARFARIN SODIUM 3 MG PO TABS: 3.0000 mg | ORAL_TABLET | Freq: Every day | ORAL | 1 refills | Status: DC

## 2022-06-01 MED ORDER — WARFARIN SODIUM 6 MG PO TABS: ORAL_TABLET | ORAL | 1 refills | Status: DC

## 2022-06-01 NOTE — Patient Instructions (Signed)
TAKE 2 TABLETS TODAY ONLY AND THEN INCREASE to 1 x 6 mg TABLET and 1 x 3 mg TABLET   Recheck INR in 2 weeks.     Anticoagulation Clinic [phone].

## 2022-06-15 ENCOUNTER — Ambulatory Visit: Payer: Medicare Other | Attending: Interventional Cardiology | Admitting: *Deleted

## 2022-06-15 DIAGNOSIS — Z86711 Personal history of pulmonary embolism: Secondary | ICD-10-CM | POA: Diagnosis not present

## 2022-06-15 DIAGNOSIS — Z5181 Encounter for therapeutic drug level monitoring: Secondary | ICD-10-CM | POA: Diagnosis not present

## 2022-06-15 LAB — POCT INR: INR: 2 (ref 2.0–3.0)

## 2022-06-15 NOTE — Patient Instructions (Addendum)
Description   Today take '12mg'$  of warfarin then continue taking warfarin '9mg'$  daily ('6mg'$  and a '3mg'$  tablet). Recheck INR in 3 weeks.    Anticoagulation Clinic [phone].

## 2022-06-21 ENCOUNTER — Other Ambulatory Visit (HOSPITAL_COMMUNITY): Payer: Self-pay

## 2022-06-21 MED FILL — Spironolactone Tab 25 MG: ORAL | 90 days supply | Qty: 45 | Fill #1 | Status: AC

## 2022-07-06 ENCOUNTER — Ambulatory Visit: Payer: Medicare Other | Attending: Interventional Cardiology

## 2022-07-06 DIAGNOSIS — Z86711 Personal history of pulmonary embolism: Secondary | ICD-10-CM

## 2022-07-06 DIAGNOSIS — Z5181 Encounter for therapeutic drug level monitoring: Secondary | ICD-10-CM | POA: Diagnosis not present

## 2022-07-06 LAB — POCT INR: INR: 2.4 (ref 2.0–3.0)

## 2022-07-06 NOTE — Patient Instructions (Signed)
continue taking warfarin '9mg'$  daily ('6mg'$  and a '3mg'$  tablet). Recheck INR in 6 weeks.    Anticoagulation Clinic [phone].

## 2022-07-23 ENCOUNTER — Ambulatory Visit: Payer: Medicare Other | Attending: Interventional Cardiology

## 2022-07-23 DIAGNOSIS — E785 Hyperlipidemia, unspecified: Secondary | ICD-10-CM

## 2022-07-23 LAB — LIPID PANEL
Chol/HDL Ratio: 1.9 ratio (ref 0.0–4.4)
Cholesterol, Total: 133 mg/dL (ref 100–199)
HDL: 70 mg/dL (ref >39)
LDL Chol Calc (NIH): 51 mg/dL (ref 0–99)
Triglycerides: 55 mg/dL (ref 0–149)
VLDL Cholesterol Cal: 12 mg/dL (ref 5–40)

## 2022-07-23 LAB — HEPATIC FUNCTION PANEL
ALT: 22 IU/L (ref 0–32)
AST: 28 IU/L (ref 0–40)
Albumin: 4.1 g/dL (ref 3.8–4.8)
Alkaline Phosphatase: 148 IU/L — ABNORMAL HIGH (ref 44–121)
Bilirubin Total: 0.3 mg/dL (ref 0.0–1.2)
Bilirubin, Direct: 0.12 mg/dL (ref 0.00–0.40)
Total Protein: 6.1 g/dL (ref 6.0–8.5)

## 2022-07-30 LAB — PROINSULIN/INSULIN RATIO
Insulin: 4.2 u[IU]/mL
Proinsulin: 4.3 pmol/L

## 2022-08-05 ENCOUNTER — Other Ambulatory Visit: Payer: Self-pay | Admitting: Physician Assistant

## 2022-08-06 NOTE — Telephone Encounter (Signed)
Received refill request for torsemide 20 mg daily.  Our records indicate patient is taking torsemide 40 mg twice daily. I spoke with patient's pharmacy and prescription is originally from 12/15/21 office visit with Richardson Dopp, PA.  Since that time patient has been hospitalized and torsemide was increased to 40 mg twice daily.  This dose was on patient's list when she saw Dr Irish Lack on 02/05/22. I spoke with patient. She has been taking torsemide 20 mg daily. She reports sometimes this dose works well and she has frequent urination.  Other days she does not feel she urinates as much.  She has a little shortness of breath with exertion but has not worsened recently.  BP, Heart rate and oxygen sat are good.  No swelling as long as she keeps legs elevated.  Weight stable--no recent gain. Will forward to Dr Irish Lack to see what dose of torsemide he recommends patient take

## 2022-08-09 NOTE — Telephone Encounter (Signed)
Reviewed with Dr Irish Lack and patient should continue 20 mg torsemide daily.  Patient notified and prescription sent to pharmacy.

## 2022-08-10 ENCOUNTER — Other Ambulatory Visit: Payer: Self-pay | Admitting: *Deleted

## 2022-08-10 DIAGNOSIS — I4891 Unspecified atrial fibrillation: Secondary | ICD-10-CM

## 2022-08-10 MED ORDER — WARFARIN SODIUM 6 MG PO TABS: ORAL_TABLET | ORAL | 2 refills | Status: DC

## 2022-08-10 MED ORDER — WARFARIN SODIUM 3 MG PO TABS: 3.0000 mg | ORAL_TABLET | Freq: Every day | ORAL | 2 refills | Status: DC

## 2022-08-10 NOTE — Telephone Encounter (Signed)
Warfarin refill request from pt via phone Last INR 07/06/2022 Last OV  02/05/2022

## 2022-08-17 ENCOUNTER — Ambulatory Visit: Payer: Medicare Other | Attending: Cardiology | Admitting: *Deleted

## 2022-08-17 DIAGNOSIS — Z86711 Personal history of pulmonary embolism: Secondary | ICD-10-CM

## 2022-08-17 DIAGNOSIS — Z5181 Encounter for therapeutic drug level monitoring: Secondary | ICD-10-CM | POA: Diagnosis not present

## 2022-08-17 LAB — POCT INR: INR: 1.5 — AB (ref 2.0–3.0)

## 2022-08-17 NOTE — Patient Instructions (Signed)
Description   Today take '12mg'$  (1-'6mg'$  & 2-'3mg'$ ) then continue taking warfarin '9mg'$  daily ('6mg'$  and a '3mg'$  tablet). Recheck INR in 2 weeks (normally 6 weeks).    Anticoagulation Clinic [phone].

## 2022-08-22 NOTE — Progress Notes (Unsigned)
Cardiology Office Note   Date:  08/23/2022   ID:  Deborah Cooke, DOB 24-Dec-1950, MRN [id number]  PCP:  Rich Fuchs, PA    No chief complaint on file.  CAD  Wt Readings from Last 3 Encounters:  08/23/22 192 lb 6.4 oz (87.3 kg)  04/12/22 201 lb 3.2 oz (91.3 kg)  02/05/22 205 lb (93 kg)       History of Present Illness: Deborah Cooke is a 71 y.o. female  with CAD, inferior STEMI in 10/2021 just days after shoulder surgery.  At the time of catheterization, she was in the sling. Had subtotal RCA occlusion with severe left main disease. IABP placed and patient had CABG.    Records show: "Inf STEMI in Feb 2023 c/b CHB - req'd IABP, temp pacer  s/p CABG (S-LAD, S-PDA, S-OM1/OM2) Cryopreserved allograft used for PDA and OM1/OM2 - needs Plavix indef Intraop VF >> defib Post op AFib >> Amiodarone  Post op Ileus Pulmonary embolism  Pulmonary hypertension  CT 3/23: main PA 45 mm Echocardiogram 2/23: RVSP 44 R sided congestive heart failure / Cor Pulmonale  Hypertension  Hyperlipidemia  Hypothyroidism  L breast CA GERD Migraine HA (Rx w Carbamazepine)   Prior CV Studies: Echocardiogram 10/27/21 EF 60-65, no RWMA, Gr 2 DD, D shaped LV c/w RV vol overload, findings c/w acute cor pulmonale, mod reduced RVSF, mildly elevated PASP, RVSP 44, mod BAE, severe MAC, mean MV 3 mmHg, mild dilation of ascending aorta (39 mm)"   SHe was discharged to rehab and went home in late March 2023.    Had PE in march and several episodes of congestive heart failure requiring hospitalization in 3/23 and 4/23.    Coumadin and Plavix currently.  No bleeding issues.   Denies : Chest pain. Dizziness.  Nitroglycerin use. Orthopnea. Palpitations. Paroxysmal nocturnal dyspnea. Syncope.    DOE with walking up hill.  Occasional leg swelling. Improved with diuretics.   Past Medical History:  Diagnosis Date   Adverse effect of anesthetic 01/2012   difficulty waking up.   Adverse effect of unspecified  anesthetic, initial encounter 08/2011   low o2 sats.   Anemia    Anxiety    Arthritis    Breast cancer (Deborah Cooke) 02/16/2017   left breast   Chronic cor pulmonale (HCC) 12/14/2021   Echocardiogram 10/27/21 EF 60-65, no RWMA, Gr 2 DD, D shaped LV c/w RV vol overload, findings c/w acute cor pulmonale, mod reduced RVSF, mildly elevated PASP, RVSP 44, mod BAE, severe MAC, mean MV 3 mmHg, mild dilation of ascending aorta (39 mm)   Chronic pain    Closed fracture of left distal femur (Deborah Cooke) 10/14/2020   Closed supracondylar fracture of right femur (Deborah Cooke) 09/18/2012   Depression    Family history of breast cancer    GERD (gastroesophageal reflux disease)    pt denies   Headache(784.0)    Hypertension    Hypothyroidism    Opioid dependence in controlled environment ([place])    Osteoporosis    Osteoporosis with fracture 09/19/2012   Pneumonia    Thyroid disease     Past Surgical History:  Procedure Laterality Date    OPEN REDUCTION INTERNAL FIXATION (ORIF) DISTAL FEMUR FRACTURE (Left Leg Upper)  10/15/2020   BACK SURGERY     fusion mid back, lumbar and thoracic   BREAST BIOPSY Left 02/16/2017   BREAST LUMPECTOMY WITH RADIOACTIVE SEED AND SENTINEL LYMPH NODE BIOPSY Left 03/15/2017   Procedure: LEFT BREAST LUMPECTOMY  WITH RADIOACTIVE SEED AND LEFT AXILLARY SENTINEL LYMPH NODE BIOPSY;  Surgeon: [name] Overall, MD;  Location: [place];  Service: General;  Laterality: Left;   CHOLECYSTECTOMY  10/1975   CORONARY ARTERY BYPASS GRAFT N/A 10/27/2021   Procedure: CORONARY ARTERY BYPASS GRAFTING (CABG) TIMES FOUR, USING LEFT INTERNAL MAMMARY ARTERY AND RIGHT LEG GREATER SAPHENOUS VEIN HARVESTED ENDOSCOPICALLY AND RIGHT CORONARY THROMBECTOMY;  Surgeon: Dahlia Byes, MD;  Location: [place];  Service: Open Heart Surgery;  Laterality: N/A;   CORONARY/GRAFT ACUTE MI REVASCULARIZATION N/A 10/26/2021   Procedure: Coronary/Graft Acute MI Revascularization;  Surgeon: Jettie Booze, MD;  Location: [place] [facility];  Service: Cardiovascular;  Laterality: N/A;   ENDOVEIN HARVEST OF GREATER SAPHENOUS VEIN Right 10/27/2021   Procedure: ENDOVEIN HARVEST OF GREATER SAPHENOUS VEIN;  Surgeon: Dahlia Byes, MD;  Location: [place];  Service: Open Heart Surgery;  Laterality: Right;   FEMUR IM NAIL  09/18/2012   Procedure: INTRAMEDULLARY (IM) RETROGRADE FEMORAL NAILING;  Surgeon: [name], MD;  Location: [facility];  Service: Orthopedics;  Laterality: Right;   GASTRIC BYPASS  2003   IABP INSERTION N/A 10/26/2021   Procedure: IABP Insertion;  Surgeon: Jettie Booze, MD;  Location: [facility];  Service: Cardiovascular;  Laterality: N/A;   JOINT REPLACEMENT     LEFT HEART CATH AND CORONARY ANGIOGRAPHY N/A 10/26/2021   Procedure: LEFT HEART CATH AND CORONARY ANGIOGRAPHY;  Surgeon: Jettie Booze, MD;  Location: [place] [facility];  Service: Cardiovascular;  Laterality: N/A;   NECK SURGERY  2000   ORIF FEMUR FRACTURE Left 10/15/2020   Procedure: OPEN REDUCTION INTERNAL FIXATION (ORIF) DISTAL FEMUR FRACTURE;  Surgeon: Shona Needles, MD;  Location: [facility];  Service: Orthopedics;  Laterality: Left;   REPLACEMENT TOTAL KNEE  2003   right and left -4 months aoart.   REVERSE SHOULDER ARTHROPLASTY Right 10/20/2021   Procedure: REVERSE SHOULDER ARTHROPLASTY;  Surgeon: Marchia Bond, MD;  Location: [facility];  Service: Orthopedics;  Laterality: Right;   RIGHT HEART CATH N/A 01/06/2022   Procedure: RIGHT HEART CATH;  Surgeon: Jolaine Artist, MD;  Location: [place] [facility];  Service: Cardiovascular;  Laterality: N/A;   TEE WITHOUT CARDIOVERSION N/A 10/27/2021   Procedure: TRANSESOPHAGEAL ECHOCARDIOGRAM (TEE);  Surgeon: Dahlia Byes, MD;  Location: [facility];  Service: Open Heart Surgery;  Laterality: N/A;   TEMPORARY PACEMAKER N/A 10/26/2021   Procedure: TEMPORARY PACEMAKER;  Surgeon: Jettie Booze, MD;  Location: [place];  Service: Cardiovascular;  Laterality: N/A;    TONSILLECTOMY     WRIST FRACTURE SURGERY Bilateral 08/2011     Current Outpatient Medications  Medication Sig Dispense Refill   B Complex Vitamins (B COMPLEX 100 PO) Take 1 tablet by mouth daily.     Biotin 10000 MCG TABS Take 10,000 mcg by mouth daily.     buPROPion (WELLBUTRIN XL) 300 MG 24 hr tablet Take 300 mg by mouth daily.     Calcium Carbonate (CALCIUM 600 PO) Take 600 mg by mouth in the morning and at bedtime.     carbamazepine (TEGRETOL XR) 400 MG 12 hr tablet Take 400 mg by mouth daily.     cholecalciferol (VITAMIN D) 400 units TABS tablet Take 400 Units by mouth daily.     clopidogrel (PLAVIX) 75 MG tablet Take 1 tablet (75 mg total) by mouth daily. Do not stop unless directed by cardiology or cardiothoracic surgery. 90 tablet 3   cyclobenzaprine (FLEXERIL) 10 MG  tablet Take 1 tablet (10 mg total) by mouth 3 (three) times daily as needed for muscle spasms. 21 tablet 0   DULoxetine (CYMBALTA) 60 MG capsule Take 60 mg by mouth 2 (two) times daily.     ferrous sulfate 325 (65 FE) MG EC tablet Take 325 mg by mouth daily with breakfast.     hydrOXYzine (ATARAX) 50 MG tablet Take 1 tablet by mouth 3 (three) times daily as needed.     latanoprost (XALATAN) 0.005 % ophthalmic solution Place 1 drop into both eyes at bedtime.     levothyroxine (SYNTHROID) 88 MCG tablet Take 88 mcg by mouth daily before breakfast. Also take liothyronine 25     liothyronine (CYTOMEL) 25 MCG tablet Take 25 mcg by mouth daily. Also take levothyroxine 88     midodrine (PROAMATINE) 2.5 MG tablet Take 1 tablet (2.5 mg total) by mouth 3 (three) times daily with meals. 270 tablet 2   Multiple Vitamins-Iron (MULTIVITAMIN/IRON PO) Take 1 tablet by mouth daily.      omeprazole (PRILOSEC) 20 MG capsule Take 20 mg by mouth every evening.     pantoprazole (PROTONIX) 40 MG tablet Take 1 tablet (40 mg total) by mouth daily.     polyethylene glycol (MIRALAX / GLYCOLAX) 17 g packet Take 17 g by mouth 2 (two) times daily.  (Patient taking differently: Take 17 g by mouth daily.) 14 each 0   potassium chloride SA (KLOR-CON M) 20 MEQ tablet Take 1 tablet (20 mEq total) by mouth daily. 90 tablet 3   QUEtiapine (SEROQUEL) 400 MG tablet Take 1 tablet by mouth daily.     rosuvastatin (CRESTOR) 40 MG tablet Take 1 tablet (40 mg total) by mouth daily. 90 tablet 3   simethicone (MYLICON) 80 MG chewable tablet Chew 80 mg by mouth every 6 (six) hours as needed for flatulence.     spironolactone (ALDACTONE) 25 MG tablet Take 1/2 tablet (12.5 mg total) by mouth daily. 45 tablet 3   torsemide (DEMADEX) 20 MG tablet TAKE 1 TABLET(20 MG) BY MOUTH DAILY 90 tablet 3   TRINTELLIX 20 MG TABS tablet Take 20 mg by mouth at bedtime.     warfarin (COUMADIN) 3 MG tablet Take 1 tablet (3 mg total) by mouth daily. As directed by [facility] 35 tablet 2   warfarin (COUMADIN) 6 MG tablet TAKE 1 TABLET BY MOUTH DAILY AS DIRECTED BY COUMADIN CLINIC 35 tablet 2   lubiprostone (AMITIZA) 24 MCG capsule Take by mouth.     Magnesium Gluconate 250 MG TABS Take by mouth.     No current facility-administered medications for this visit.    Allergies:   Ibuprofen, Nsaids, and Tolmetin    Social History:  The patient  reports that she quit smoking about 33 years ago. Her smoking use included cigarettes. She has a 28.00 pack-year smoking history. She quit smokeless tobacco use about 33 years ago. She reports current alcohol use. She reports that she does not use drugs.   Family History:  The patient's family history includes Breast cancer (age of onset: 26) in her mother; Cancer (age of onset: 57) in her father.    ROS:  Please see the history of present illness.   Otherwise, review of systems are positive for improved swelling.   All other systems are reviewed and negative.    PHYSICAL EXAM: VS:  BP 126/74   Pulse 71   Ht _0  (1.499 m)   Wt 192 lb 6.4 oz (87.3  kg)   SpO2 97%   BMI 38.86 kg/m  , BMI Body mass index is 38.86 kg/m. GEN:  Well nourished, well developed, in no acute distress HEENT: normal Neck: no JVD, carotid bruits, or masses Cardiac: RRR; no murmurs, rubs, or gallops,no edema  Respiratory:  clear to auscultation bilaterally, normal work of breathing GI: soft, nontender, nondistended, + BS MS: no deformity or atrophy Skin: warm and dry, no rash Neuro:  Strength and sensation are intact; tardive dyskinesia type oral movements.  Psych: euthymic mood, full affect   EKG:   The ekg ordered 4/23 demonstrates NSR, no ST changes   Recent Labs: 01/02/2022: B Natriuretic Peptide 405.8 01/05/2022: TSH 9.721 01/08/2022: Magnesium 2.6 04/12/2022: BUN 24; Creatinine 0.88; Potassium 4.3; Sodium 142 04/21/2022: Hemoglobin 12.9; Platelets 298 07/23/2022: ALT 22   Lipid Panel    Component Value Date/Time   CHOL 133 07/23/2022 0746   TRIG 55 07/23/2022 0746   HDL 70 07/23/2022 0746   CHOLHDL 1.9 07/23/2022 0746   CHOLHDL 2.8 10/26/2021 2240   VLDL 29 10/26/2021 2240   LDLCALC 51 07/23/2022 0746     Other studies Reviewed: Additional studies/ records that were reviewed today with results demonstrating: labs reviewed.   ASSESSMENT AND PLAN:  CAD: Status post CABG in the setting of acute MI.  Massive thrombus in the RCA noted at the time of diagnostic catheterization.  Starting chair yoga for exercise.   Hyperlipidemia: Crestor was increased to 20 mg daily.  In November 2023 total cholesterol 133 HDL 70 LDL 51 triglycerides 55.  Will be able to eat more greens off of coumadin.  Hypertension: The current medical regimen is effective;  continue present plan and medications. Postoperative atrial fibrillation: No sx of AFib.  Has had easy bruising on COumadin.  We discussed the risk of stroke with stopping anticoagulation.  She is willing to stay on Plavix but wants to stop anticoagulation.  History of PE: Diagnosed in March 2023 in the setting of her surgery.  Tolerated Coumadin.  Plan was to continue Coumadin for 6  months.  Stop Coumadin start aspirin 81 mg daily to continue until February 2024. Plan was for lifelong clopidogrel.   Current medicines are reviewed at length with the patient today.  The patient concerns regarding her medicines were addressed.  The following changes have been made:  No change  Labs/ tests ordered today include:  No orders of the defined types were placed in this encounter.   Recommend 150 minutes/week of aerobic exercise Low fat, low carb, high fiber diet recommended  Disposition:   FU in 6 months   Signed, Larae Grooms, MD  08/23/2022 11:20 AM    [facility] [address], [place], [place]  [address] Phone: [phone]; Fax: [phone]

## 2022-08-23 ENCOUNTER — Encounter: Payer: Self-pay | Admitting: Interventional Cardiology

## 2022-08-23 ENCOUNTER — Ambulatory Visit: Payer: Medicare Other | Attending: Interventional Cardiology | Admitting: Interventional Cardiology

## 2022-08-23 VITALS — BP 126/74 | HR 71 | Ht 59.0 in | Wt 192.4 lb

## 2022-08-23 DIAGNOSIS — I4891 Unspecified atrial fibrillation: Secondary | ICD-10-CM

## 2022-08-23 DIAGNOSIS — E785 Hyperlipidemia, unspecified: Secondary | ICD-10-CM

## 2022-08-23 DIAGNOSIS — I9789 Other postprocedural complications and disorders of the circulatory system, not elsewhere classified: Secondary | ICD-10-CM | POA: Diagnosis not present

## 2022-08-23 DIAGNOSIS — I25118 Atherosclerotic heart disease of native coronary artery with other forms of angina pectoris: Secondary | ICD-10-CM | POA: Diagnosis not present

## 2022-08-23 DIAGNOSIS — I10 Essential (primary) hypertension: Secondary | ICD-10-CM | POA: Diagnosis not present

## 2022-08-23 MED ORDER — ASPIRIN 81 MG PO TBEC: 81.0000 mg | DELAYED_RELEASE_TABLET | Freq: Every day | ORAL | 3 refills | Status: DC

## 2022-08-23 NOTE — Patient Instructions (Addendum)
Medication Instructions:  Your physician has recommended you make the following change in your medication:  Stop warfarin. Start aspirin 81 mg daily and continue Clopidogrel 75 mg by mouth daily. On October 26, 2022 stop aspirin.  Continue Clopidogrel 75 mg by mouth daily    *If you need a refill on your cardiac medications before your next appointment, please call your pharmacy*   Lab Work: none If you have labs (blood work) drawn today and your tests are completely normal, you will receive your results only by: [facility] (if you have MyChart) OR A paper copy in the mail If you have any lab test that is abnormal or we need to change your treatment, we will call you to review the results.   Testing/Procedures: none   Follow-Up: At [facility], you and your health needs are our priority.  As part of our continuing mission to provide you with exceptional heart care, we have created designated Provider Care Teams.  These Care Teams include your primary Cardiologist (physician) and Advanced Practice Providers (APPs -  Physician Assistants and Nurse Practitioners) who all work together to provide you with the care you need, when you need it.  We recommend signing up for the patient portal called "MyChart".  Sign up information is provided on this After Visit Summary.  MyChart is used to connect with patients for Virtual Visits (Telemedicine).  Patients are able to view lab/test results, encounter notes, upcoming appointments, etc.  Non-urgent messages can be sent to your provider as well.   To learn more about what you can do with MyChart, go to [email or web address].    Your next appointment:   03/07/23 at 11:20  The format for your next appointment:   In Person  Provider:   Larae Grooms, MD     Other Instructions    Important Information About Sugar

## 2022-08-30 ENCOUNTER — Other Ambulatory Visit: Payer: Self-pay | Admitting: Hematology

## 2022-08-30 DIAGNOSIS — Z170 Estrogen receptor positive status [ER+]: Secondary | ICD-10-CM

## 2022-08-30 NOTE — Telephone Encounter (Signed)
Medication was stopped by oncology provider

## 2022-09-19 ENCOUNTER — Other Ambulatory Visit (HOSPITAL_COMMUNITY): Payer: Self-pay

## 2022-09-19 MED FILL — Spironolactone Tab 25 MG: ORAL | 90 days supply | Qty: 45 | Fill #2 | Status: CN

## 2022-09-21 ENCOUNTER — Other Ambulatory Visit (HOSPITAL_COMMUNITY): Payer: Self-pay

## 2022-09-21 MED FILL — Spironolactone Tab 25 MG: ORAL | 90 days supply | Qty: 45 | Fill #2 | Status: CN

## 2022-09-24 ENCOUNTER — Encounter: Payer: Self-pay | Admitting: Hematology

## 2022-10-08 ENCOUNTER — Other Ambulatory Visit: Payer: Self-pay

## 2022-10-08 ENCOUNTER — Other Ambulatory Visit (HOSPITAL_COMMUNITY): Payer: Self-pay

## 2022-10-08 MED FILL — Spironolactone Tab 25 MG: ORAL | 90 days supply | Qty: 45 | Fill #2 | Status: CN

## 2022-10-08 MED FILL — Spironolactone Tab 25 MG: ORAL | 90 days supply | Qty: 45 | Fill #2 | Status: AC

## 2022-11-28 ENCOUNTER — Other Ambulatory Visit: Payer: Self-pay | Admitting: Interventional Cardiology

## 2022-12-28 ENCOUNTER — Other Ambulatory Visit: Payer: Self-pay | Admitting: Physician Assistant

## 2023-01-18 ENCOUNTER — Other Ambulatory Visit (HOSPITAL_COMMUNITY): Payer: Self-pay

## 2023-01-18 MED FILL — Spironolactone Tab 25 MG: ORAL | 90 days supply | Qty: 45 | Fill #3 | Status: AC

## 2023-01-27 ENCOUNTER — Other Ambulatory Visit: Payer: Self-pay | Admitting: Physician Assistant

## 2023-02-26 ENCOUNTER — Other Ambulatory Visit: Payer: Self-pay | Admitting: Cardiothoracic Surgery

## 2023-03-06 NOTE — Progress Notes (Unsigned)
Cardiology Office Note   Date:  03/07/2023   ID:  Deborah Cooke, DOB [birth year], MRN [id number]  PCP:  Heron Nay, PA    No chief complaint on file.  CAD  Wt Readings from Last 3 Encounters:  03/07/23 197 lb (89.4 kg)  08/23/22 192 lb 6.4 oz (87.3 kg)  04/12/22 201 lb 3.2 oz (91.3 kg)       History of Present Illness: Deborah Cooke is a 72 y.o. female  with CAD, inferior STEMI in 10/2021 just days after shoulder surgery.  At the time of catheterization, she was in the sling. Had subtotal RCA occlusion with severe left main disease. IABP placed and patient had CABG.    Records show: "Inf STEMI in Feb 2023 c/b CHB - req'd IABP, temp pacer  s/p CABG (S-LAD, S-PDA, S-OM1/OM2) Cryopreserved allograft used for PDA and OM1/OM2 - needs Plavix indef Intraop VF >> defib Post op AFib >> Amiodarone  Post op Ileus Pulmonary embolism  Pulmonary hypertension  CT 3/23: main PA 45 mm Echocardiogram 2/23: RVSP 44 R sided congestive heart failure / Cor Pulmonale  Hypertension  Hyperlipidemia  Hypothyroidism  L breast CA GERD Migraine HA (Rx w Carbamazepine)   Prior CV Studies: Echocardiogram 10/27/21 EF 60-65, no RWMA, Gr 2 DD, D shaped LV c/w RV vol overload, findings c/w acute cor pulmonale, mod reduced RVSF, mildly elevated PASP, RVSP 44, mod BAE, severe MAC, mean MV 3 mmHg, mild dilation of ascending aorta (39 mm)"   SHe was discharged to rehab and went home in late March 2023.    Had PE in march and several episodes of congestive heart failure requiring hospitalization in 3/23 and 4/23.    Coumadin and Plavix for some time.  No bleeding issues.Now off COumadin.  Hurt leg in 2024 and had to resume using the walker.   Has trip planned to [place] (cruise) in August 2024.      Past Medical History:  Diagnosis Date   Adverse effect of anesthetic 01/2012   difficulty waking up.   Adverse effect of unspecified anesthetic, initial encounter 08/2011   low o2 sats.   Anemia     Anxiety    Arthritis    Breast cancer (HCC) 02/16/2017   left breast   Chronic cor pulmonale (HCC) 12/14/2021   Echocardiogram 10/27/21 EF 60-65, no RWMA, Gr 2 DD, D shaped LV c/w RV vol overload, findings c/w acute cor pulmonale, mod reduced RVSF, mildly elevated PASP, RVSP 44, mod BAE, severe MAC, mean MV 3 mmHg, mild dilation of ascending aorta (39 mm)   Chronic pain    Closed fracture of left distal femur (HCC) 10/14/2020   Closed supracondylar fracture of right femur (HCC) 09/18/2012   Depression    Family history of breast cancer    GERD (gastroesophageal reflux disease)    pt denies   Headache(784.0)    Hypertension    Hypothyroidism    Opioid dependence in controlled environment (HCC)    Osteoporosis    Osteoporosis with fracture 09/19/2012   Pneumonia    Thyroid disease     Past Surgical History:  Procedure Laterality Date    OPEN REDUCTION INTERNAL FIXATION (ORIF) DISTAL FEMUR FRACTURE (Left Leg Upper)  10/15/2020   BACK SURGERY     fusion mid back, lumbar and thoracic   BREAST BIOPSY Left 02/16/2017   BREAST LUMPECTOMY WITH RADIOACTIVE SEED AND SENTINEL LYMPH NODE BIOPSY Left 03/15/2017   Procedure: LEFT BREAST LUMPECTOMY WITH  RADIOACTIVE SEED AND LEFT AXILLARY SENTINEL LYMPH NODE BIOPSY;  Surgeon: Ovidio Kin, MD;  Location: [facility];  Service: General;  Laterality: Left;   CHOLECYSTECTOMY  10/1975   CORONARY ARTERY BYPASS GRAFT N/A 10/27/2021   Procedure: CORONARY ARTERY BYPASS GRAFTING (CABG) TIMES FOUR, USING LEFT INTERNAL MAMMARY ARTERY AND RIGHT LEG GREATER SAPHENOUS VEIN HARVESTED ENDOSCOPICALLY AND RIGHT CORONARY THROMBECTOMY;  Surgeon: Lovett Sox, MD;  Location: [facility] OR;  Service: Open Heart Surgery;  Laterality: N/A;   CORONARY/GRAFT ACUTE MI REVASCULARIZATION N/A 10/26/2021   Procedure: Coronary/Graft Acute MI Revascularization;  Surgeon: Corky Crafts, MD;  Location: [facility] INVASIVE CV LAB;  Service: Cardiovascular;  Laterality: N/A;    ENDOVEIN HARVEST OF GREATER SAPHENOUS VEIN Right 10/27/2021   Procedure: ENDOVEIN HARVEST OF GREATER SAPHENOUS VEIN;  Surgeon: Lovett Sox, MD;  Location: [facility] OR;  Service: Open Heart Surgery;  Laterality: Right;   FEMUR IM NAIL  09/18/2012   Procedure: INTRAMEDULLARY (IM) RETROGRADE FEMORAL NAILING;  Surgeon: Eulas Post, MD;  Location: [facility] OR;  Service: Orthopedics;  Laterality: Right;   GASTRIC BYPASS  2003   IABP INSERTION N/A 10/26/2021   Procedure: IABP Insertion;  Surgeon: Corky Crafts, MD;  Location: [facility] INVASIVE CV LAB;  Service: Cardiovascular;  Laterality: N/A;   JOINT REPLACEMENT     LEFT HEART CATH AND CORONARY ANGIOGRAPHY N/A 10/26/2021   Procedure: LEFT HEART CATH AND CORONARY ANGIOGRAPHY;  Surgeon: Corky Crafts, MD;  Location: [facility] INVASIVE CV LAB;  Service: Cardiovascular;  Laterality: N/A;   NECK SURGERY  2000   ORIF FEMUR FRACTURE Left 10/15/2020   Procedure: OPEN REDUCTION INTERNAL FIXATION (ORIF) DISTAL FEMUR FRACTURE;  Surgeon: Roby Lofts, MD;  Location: [facility] OR;  Service: Orthopedics;  Laterality: Left;   REPLACEMENT TOTAL KNEE  2003   right and left -4 months aoart.   REVERSE SHOULDER ARTHROPLASTY Right 10/20/2021   Procedure: REVERSE SHOULDER ARTHROPLASTY;  Surgeon: Teryl Lucy, MD;  Location: [facility];  Service: Orthopedics;  Laterality: Right;   RIGHT HEART CATH N/A 01/06/2022   Procedure: RIGHT HEART CATH;  Surgeon: Dolores Patty, MD;  Location: [facility] INVASIVE CV LAB;  Service: Cardiovascular;  Laterality: N/A;   TEE WITHOUT CARDIOVERSION N/A 10/27/2021   Procedure: TRANSESOPHAGEAL ECHOCARDIOGRAM (TEE);  Surgeon: Lovett Sox, MD;  Location: [facility] OR;  Service: Open Heart Surgery;  Laterality: N/A;   TEMPORARY PACEMAKER N/A 10/26/2021   Procedure: TEMPORARY PACEMAKER;  Surgeon: Corky Crafts, MD;  Location: [facility] INVASIVE CV LAB;  Service: Cardiovascular;  Laterality: N/A;   TONSILLECTOMY     WRIST FRACTURE SURGERY Bilateral 08/2011      Current Outpatient Medications  Medication Sig Dispense Refill   aspirin EC 81 MG tablet Take 1 tablet (81 mg total) by mouth daily. Swallow whole. 90 tablet 3   B Complex Vitamins (B COMPLEX 100 PO) Take 1 tablet by mouth daily.     Biotin 16109 MCG TABS Take 10,000 mcg by mouth daily.     buPROPion (WELLBUTRIN XL) 300 MG 24 hr tablet Take 300 mg by mouth daily.     Calcium Carbonate (CALCIUM 600 PO) Take 600 mg by mouth in the morning and at bedtime.     cholecalciferol (VITAMIN D) 400 units TABS tablet Take 400 Units by mouth daily.     clopidogrel (PLAVIX) 75 MG tablet Take 1 tablet (75 mg total) by mouth daily. Do not stop unless directed by cardiology or cardiothoracic surgery. 90 tablet 3   cyclobenzaprine (FLEXERIL)  10 MG tablet Take 1 tablet (10 mg total) by mouth 3 (three) times daily as needed for muscle spasms. 21 tablet 0   DULoxetine (CYMBALTA) 60 MG capsule Take 60 mg by mouth 2 (two) times daily.     ferrous sulfate 325 (65 FE) MG EC tablet Take 325 mg by mouth daily with breakfast.     hydrOXYzine (ATARAX) 50 MG tablet Take 1 tablet by mouth 3 (three) times daily as needed.     levothyroxine (SYNTHROID) 88 MCG tablet Take 88 mcg by mouth daily before breakfast. Also take liothyronine 25     liothyronine (CYTOMEL) 25 MCG tablet Take 25 mcg by mouth daily. Also take levothyroxine 88     lubiprostone (AMITIZA) 24 MCG capsule Take by mouth.     Magnesium Gluconate 250 MG TABS Take by mouth.     midodrine (PROAMATINE) 2.5 MG tablet TAKE 1 TABLET(2.5 MG) BY MOUTH THREE TIMES DAILY WITH MEALS 270 tablet 2   Multiple Vitamins-Iron (MULTIVITAMIN/IRON PO) Take 1 tablet by mouth daily.      polyethylene glycol (MIRALAX / GLYCOLAX) 17 g packet Take 17 g by mouth 2 (two) times daily. (Patient taking differently: Take 17 g by mouth daily.) 14 each 0   Potassium Chloride ER 20 MEQ TBCR TAKE 1 TABLET BY MOUTH DAILY 90 tablet 2   QUEtiapine (SEROQUEL) 400 MG tablet Take 1 tablet by mouth  daily.     rosuvastatin (CRESTOR) 40 MG tablet Take 1 tablet (40 mg total) by mouth daily. 90 tablet 3   simethicone (MYLICON) 80 MG chewable tablet Chew 80 mg by mouth every 6 (six) hours as needed for flatulence.     spironolactone (ALDACTONE) 25 MG tablet Take 1/2 tablet (12.5 mg total) by mouth daily. 45 tablet 3   torsemide (DEMADEX) 20 MG tablet TAKE 1 TABLET(20 MG) BY MOUTH DAILY 90 tablet 3   vortioxetine HBr (TRINTELLIX) 10 MG TABS tablet Take by mouth.     No current facility-administered medications for this visit.    Allergies:   Ibuprofen, Nsaids, and Tolmetin    Social History:  The patient  reports that she quit smoking about 34 years ago. Her smoking use included cigarettes. She has a 28.00 pack-year smoking history. She quit smokeless tobacco use about 34 years ago. She reports current alcohol use. She reports that she does not use drugs.   Family History:  The patient's family history includes Breast cancer (age of onset: 14) in her mother; Cancer (age of onset: 60) in her father.    ROS:  Please see the history of present illness.   Otherwise, review of systems are positive for balance problems- using walker.   All other systems are reviewed and negative.    PHYSICAL EXAM: VS:  BP 90/70   Pulse 89   Ht 4' 10.5" (1.486 m)   Wt 197 lb (89.4 kg)   BMI 40.47 kg/m  , BMI Body mass index is 40.47 kg/m. GEN: Well nourished, well developed, in no acute distress HEENT: normal Neck: no JVD, carotid bruits, or masses Cardiac: RRR; no murmurs, rubs, or gallops, bilateral edema  Respiratory:  clear to auscultation bilaterally, normal work of breathing GI: soft, nontender, nondistended, + BS MS: no deformity or atrophy Skin: warm and dry, no rash Neuro:  Strength and sensation are intact Psych: euthymic mood, full affect   EKG:   The ekg ordered today demonstrates NSR, PVCs, PACs   Recent Labs: 04/12/2022: BUN 24; Creatinine 0.88; Potassium  4.3; Sodium 142 04/21/2022:  Hemoglobin 12.9; Platelets 298 07/23/2022: ALT 22   Lipid Panel    Component Value Date/Time   CHOL 133 07/23/2022 0746   TRIG 55 07/23/2022 0746   HDL 70 07/23/2022 0746   CHOLHDL 1.9 07/23/2022 0746   CHOLHDL 2.8 10/26/2021 2240   VLDL 29 10/26/2021 2240   LDLCALC 51 07/23/2022 0746     Other studies Reviewed: Additional studies/ records that were reviewed today with results demonstrating: labs reviewed.   ASSESSMENT AND PLAN:  CAD: s/p CABG in the setting of acute MI.  Continue   clopidogrel. okay to stop aspirin if she has bleeding problems.  Hemoglobin has been stable over the last several readings.  She is comfortable taking the aspirin.  She does have a long flight to [place] planned followed by a cruise.  We spoke about using compression stockings on the plane and trying to get up and move around, especially given her history of DVT/PE. Hyperlipidemia: The current medical regimen is effective;  continue present plan and medications.  LDL 51 in November 2023.  LDL 43 in May 2024.  Continue rosuvastatin okay. HTN: Borderline low readings at time but no syncope. On low dose spironolactone for diuretic effect as well.  Postop AFib: after CABG.  History of RU:[id number] with COumadin for 6 months. Plan is for lifelong Plavix.  Leg edema: .Leg elevation and compression stocking   Current medicines are reviewed at length with the patient today.  The patient concerns regarding her medicines were addressed.  The following changes have been made:  No change  Labs/ tests ordered today include:  No orders of the defined types were placed in this encounter.   Recommend 150 minutes/week of aerobic exercise Low fat, low carb, high fiber diet recommended  Disposition:   FU in 6 months   Signed, Lance Muss, MD  03/07/2023 12:07 PM    [facility] [address] [place], [place], [place]  [address] Phone: [phone]; Fax: [phone]

## 2023-03-07 ENCOUNTER — Ambulatory Visit: Payer: Medicare Other | Attending: Interventional Cardiology | Admitting: Interventional Cardiology

## 2023-03-07 ENCOUNTER — Encounter: Payer: Self-pay | Admitting: Interventional Cardiology

## 2023-03-07 VITALS — BP 90/70 | HR 89 | Ht 58.5 in | Wt 197.0 lb

## 2023-03-07 DIAGNOSIS — I9789 Other postprocedural complications and disorders of the circulatory system, not elsewhere classified: Secondary | ICD-10-CM | POA: Diagnosis not present

## 2023-03-07 DIAGNOSIS — I10 Essential (primary) hypertension: Secondary | ICD-10-CM

## 2023-03-07 DIAGNOSIS — I4891 Unspecified atrial fibrillation: Secondary | ICD-10-CM

## 2023-03-07 DIAGNOSIS — I25118 Atherosclerotic heart disease of native coronary artery with other forms of angina pectoris: Secondary | ICD-10-CM

## 2023-03-07 DIAGNOSIS — I2781 Cor pulmonale (chronic): Secondary | ICD-10-CM

## 2023-03-07 DIAGNOSIS — E785 Hyperlipidemia, unspecified: Secondary | ICD-10-CM | POA: Diagnosis not present

## 2023-03-07 MED ORDER — SPIRONOLACTONE 25 MG PO TABS: 12.5000 mg | ORAL_TABLET | Freq: Every day | ORAL | 3 refills | Status: DC

## 2023-03-07 NOTE — Addendum Note (Signed)
Addended by: Dossie Arbour on: 03/07/2023 03:35 PM   Modules accepted: Orders

## 2023-03-07 NOTE — Patient Instructions (Signed)
Medication Instructions:  Your physician recommends that you continue on your current medications as directed. Please refer to the Current Medication list given to you today.  *If you need a refill on your cardiac medications before your next appointment, please call your pharmacy*   Lab Work: none If you have labs (blood work) drawn today and your tests are completely normal, you will receive your results only by: MyChart Message (if you have MyChart) OR A paper copy in the mail If you have any lab test that is abnormal or we need to change your treatment, we will call you to review the results.   Testing/Procedures: none   Follow-Up: At [facility], you and your health needs are our priority.  As part of our continuing mission to provide you with exceptional heart care, we have created designated Provider Care Teams.  These Care Teams include your primary Cardiologist (physician) and Advanced Practice Providers (APPs -  Physician Assistants and Nurse Practitioners) who all work together to provide you with the care you need, when you need it.  We recommend signing up for the patient portal called "MyChart".  Sign up information is provided on this After Visit Summary.  MyChart is used to connect with patients for Virtual Visits (Telemedicine).  Patients are able to view lab/test results, encounter notes, upcoming appointments, etc.  Non-urgent messages can be sent to your provider as well.   To learn more about what you can do with MyChart, go to [email or web address].    Your next appointment:   September 07, 2023 at 11:00  Provider:   Lance Muss, MD     Other Instructions

## 2023-04-12 ENCOUNTER — Other Ambulatory Visit: Payer: Self-pay

## 2023-04-12 DIAGNOSIS — Z170 Estrogen receptor positive status [ER+]: Secondary | ICD-10-CM

## 2023-04-13 ENCOUNTER — Inpatient Hospital Stay: Payer: Medicare Other

## 2023-04-13 ENCOUNTER — Other Ambulatory Visit: Payer: Self-pay | Admitting: Physician Assistant

## 2023-04-13 ENCOUNTER — Inpatient Hospital Stay: Payer: Medicare Other | Admitting: Nurse Practitioner

## 2023-04-13 ENCOUNTER — Other Ambulatory Visit: Payer: Self-pay | Admitting: Interventional Cardiology

## 2023-07-12 ENCOUNTER — Other Ambulatory Visit: Payer: Self-pay | Admitting: Interventional Cardiology

## 2023-07-15 ENCOUNTER — Telehealth: Payer: Self-pay

## 2023-07-15 NOTE — Telephone Encounter (Signed)
Primary Cardiologist:Jayadeep Eldridge Dace, MD   Preoperative team, please contact this patient and set up a phone call appointment for further preoperative risk assessment. Please obtain consent and complete medication review. Thank you for your help.   Per office protocol and pending no cardiac symptoms at time of visit, she may hold Plavix for 5 days prior to procedure and should resume as soon as hemodynamically stable postoperatively. Ideally aspirin should be continued without interruption, however if the bleeding risk is too great, aspirin may be held for 5-7 days prior to surgery. Please resume aspirin post operatively when it is felt to be safe from a bleeding standpoint.    I also confirmed the patient resides in the state of [place]. As per [facility] Medical Board telemedicine laws, the patient must reside in the state in which the provider is licensed.   Levi Aland, NP-C  07/15/2023, 9:12 AM [address], [address] Office [phone] Fax [phone]

## 2023-07-15 NOTE — Telephone Encounter (Signed)
  Patient Consent for Virtual Visit        Deborah Cooke has provided verbal consent on 07/15/2023 for a virtual visit (video or telephone).   CONSENT FOR VIRTUAL VISIT FOR:  Deborah Cooke  By participating in this virtual visit I agree to the following:  I hereby voluntarily request, consent and authorize [facility] and its employed or contracted physicians, physician assistants, nurse practitioners or other licensed health care professionals (the Practitioner), to provide me with telemedicine health care services (the "Services") as deemed necessary by the treating Practitioner. I acknowledge and consent to receive the Services by the Practitioner via telemedicine. I understand that the telemedicine visit will involve communicating with the Practitioner through live audiovisual communication technology and the disclosure of certain medical information by electronic transmission. I acknowledge that I have been given the opportunity to request an in-person assessment or other available alternative prior to the telemedicine visit and am voluntarily participating in the telemedicine visit.  I understand that I have the right to withhold or withdraw my consent to the use of telemedicine in the course of my care at any time, without affecting my right to future care or treatment, and that the Practitioner or I may terminate the telemedicine visit at any time. I understand that I have the right to inspect all information obtained and/or recorded in the course of the telemedicine visit and may receive copies of available information for a reasonable fee.  I understand that some of the potential risks of receiving the Services via telemedicine include:  Delay or interruption in medical evaluation due to technological equipment failure or disruption; Information transmitted may not be sufficient (e.g. poor resolution of images) to allow for appropriate medical decision making by the Practitioner;  and/or  In rare instances, security protocols could fail, causing a breach of personal health information.  Furthermore, I acknowledge that it is my responsibility to provide information about my medical history, conditions and care that is complete and accurate to the best of my ability. I acknowledge that Practitioner's advice, recommendations, and/or decision may be based on factors not within their control, such as incomplete or inaccurate data provided by me or distortions of diagnostic images or specimens that may result from electronic transmissions. I understand that the practice of medicine is not an exact science and that Practitioner makes no warranties or guarantees regarding treatment outcomes. I acknowledge that a copy of this consent can be made available to me via my patient portal [facility] MyChart), or I can request a printed copy by calling the office of [facility].    I understand that my insurance will be billed for this visit.   I have read or had this consent read to me. I understand the contents of this consent, which adequately explains the benefits and risks of the Services being provided via telemedicine.  I have been provided ample opportunity to ask questions regarding this consent and the Services and have had my questions answered to my satisfaction. I give my informed consent for the services to be provided through the use of telemedicine in my medical care

## 2023-07-15 NOTE — Telephone Encounter (Signed)
Pre-operative Risk Assessment    Patient Name: Deborah Cooke  DOB: Apr 11, 1951 MRN: [id number]      Request for Surgical Clearance    Procedure:   Left Hip Arthroplasty  Date of Surgery:  Clearance TBD                                 Surgeon:  Not Listed Surgeon's Group or Practice Name: [facility] [place] Phone number:  [phone] Fax number:  [phone]   Type of Clearance Requested:   - Medical    Type of Anesthesia:  Not Indicated   Additional requests/questions:    Garrel Ridgel   07/15/2023, 8:46 AM

## 2023-07-15 NOTE — Telephone Encounter (Signed)
Spoke with patient who is agreeable to do a tele visit on 11/6 at 10:20 am. Med rec and consent done.

## 2023-07-27 ENCOUNTER — Ambulatory Visit: Payer: Medicare Other | Attending: Cardiology

## 2023-07-27 DIAGNOSIS — Z01810 Encounter for preprocedural cardiovascular examination: Secondary | ICD-10-CM | POA: Diagnosis not present

## 2023-07-27 IMAGING — DX DG ABD PORTABLE 1V
1 series · 3 of 3 positions shown · non-contrast
Comparison: Abdominal radiograph 10/31/2021.

CLINICAL DATA: 71-year-old female with history of CABG.  Ileus.

EXAM:
PORTABLE ABDOMEN - 1 VIEW

[Series 1: abdomen · 0.14mm/px · 3 of 3 slices shown]
[im 1/3]
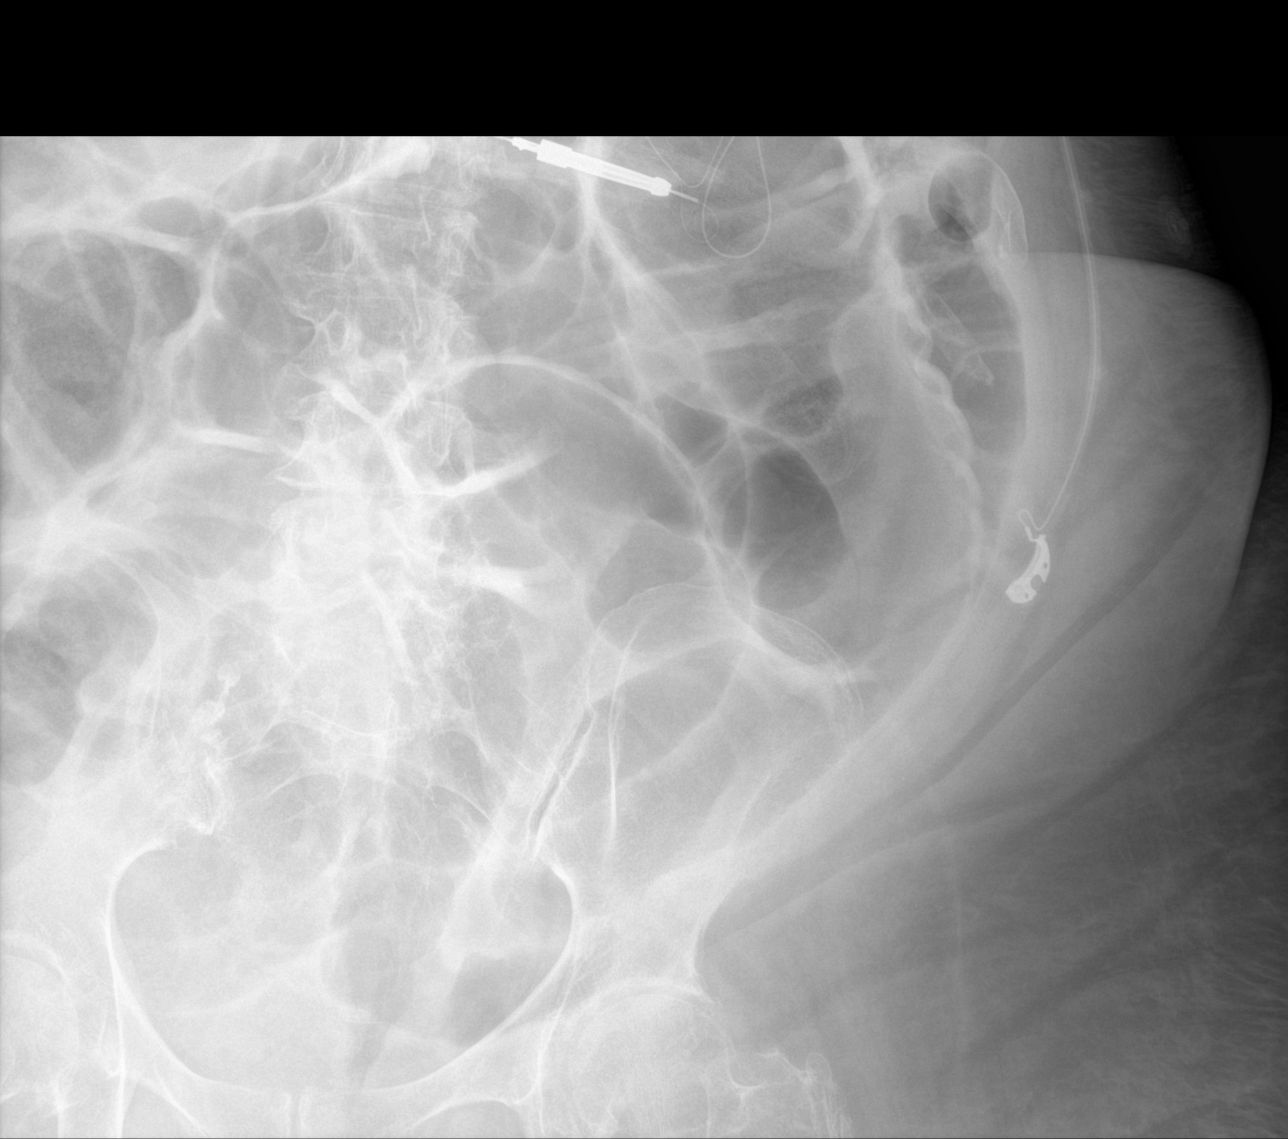
[im 2/3]
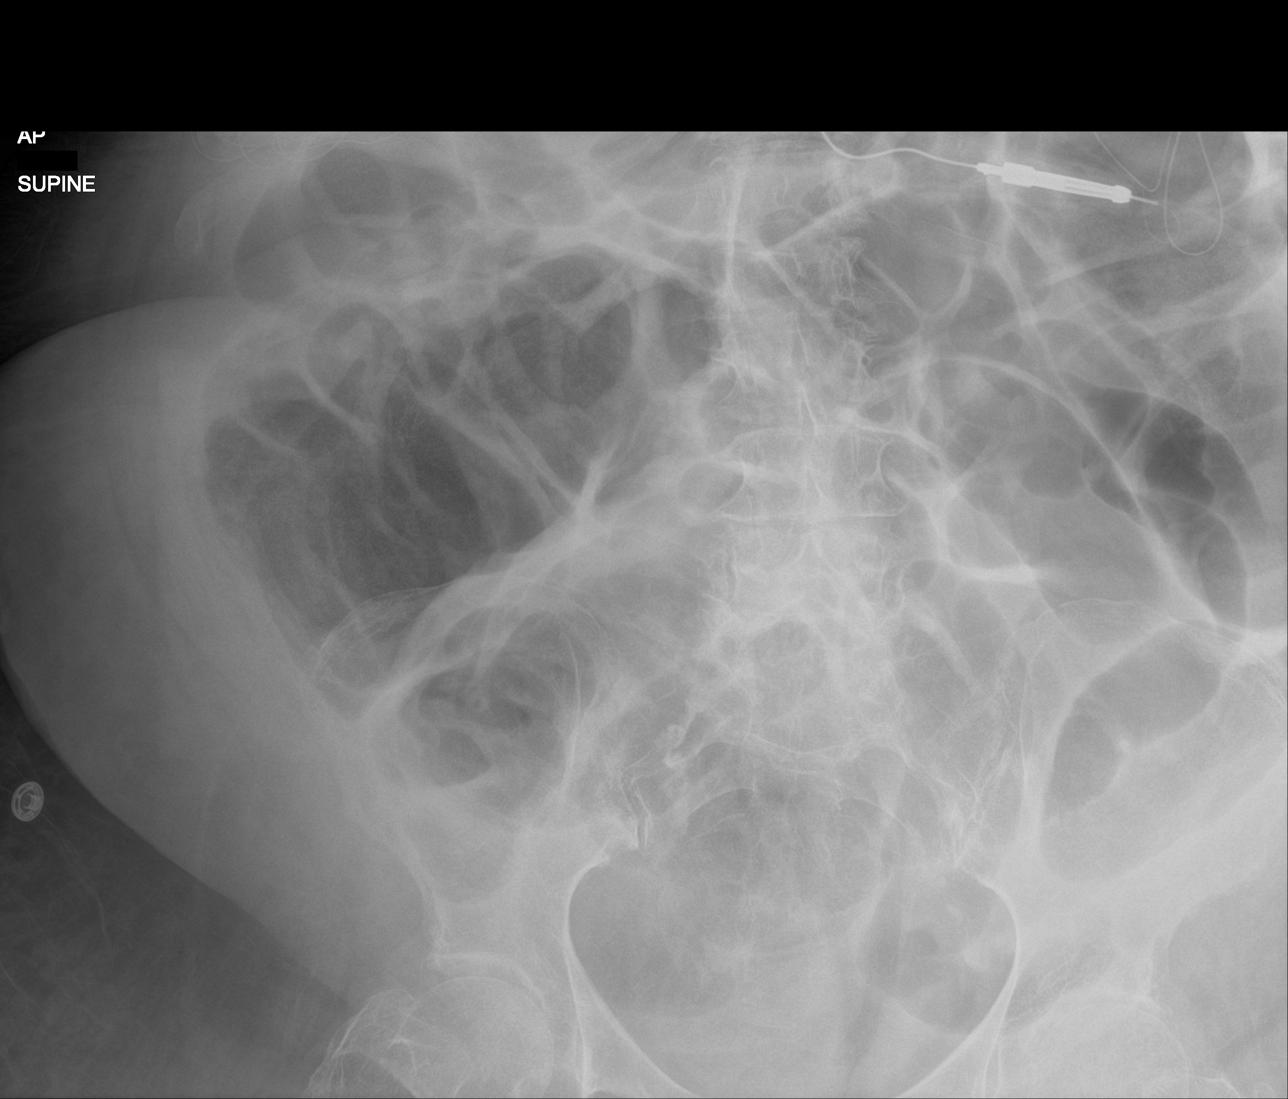
[im 3/3]
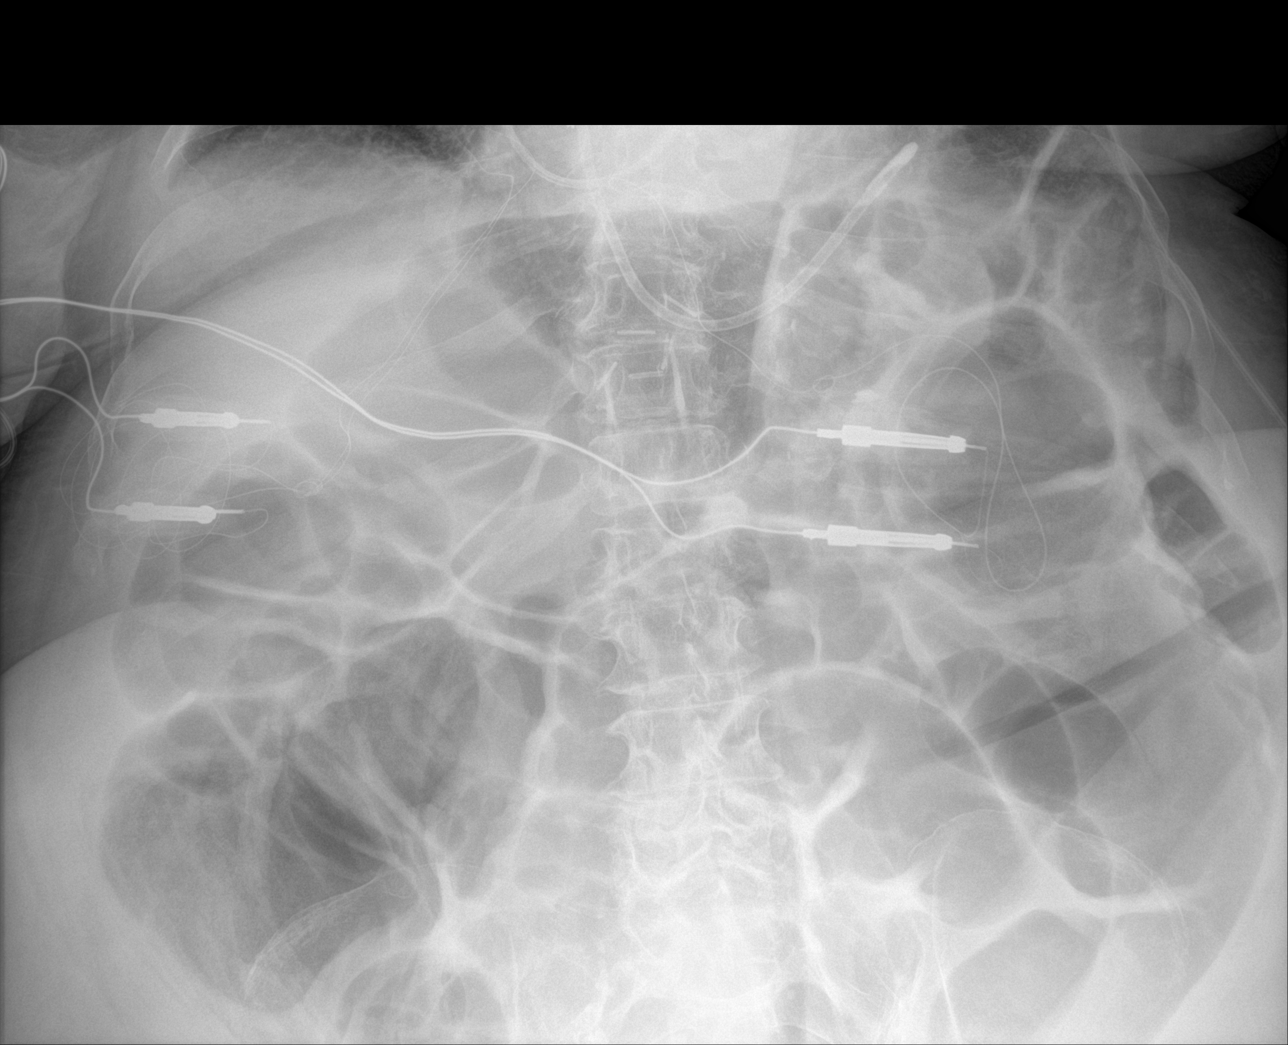

[3 of 3 positions shown; findings below may reference images not displayed]

FINDINGS: Feeding tube extends into the proximal stomach. Diffuse gaseous
distension is again noted throughout the small bowel and colon. No
gross pneumoperitoneum identified on these supine images.
IMPRESSION: 1. The appearance of the abdomen is once again suggestive of an
ileus.
2. Feeding tube tip is in the proximal stomach and should be
advanced.

## 2023-07-27 IMAGING — DX DG CHEST 1V PORT
1 series · 1 of 1 positions shown · non-contrast
Comparison: Chest x-ray 10/31/2021.

CLINICAL DATA: 71-year-old female with history of CABG.

EXAM:
PORTABLE CHEST 1 VIEW

[chest]
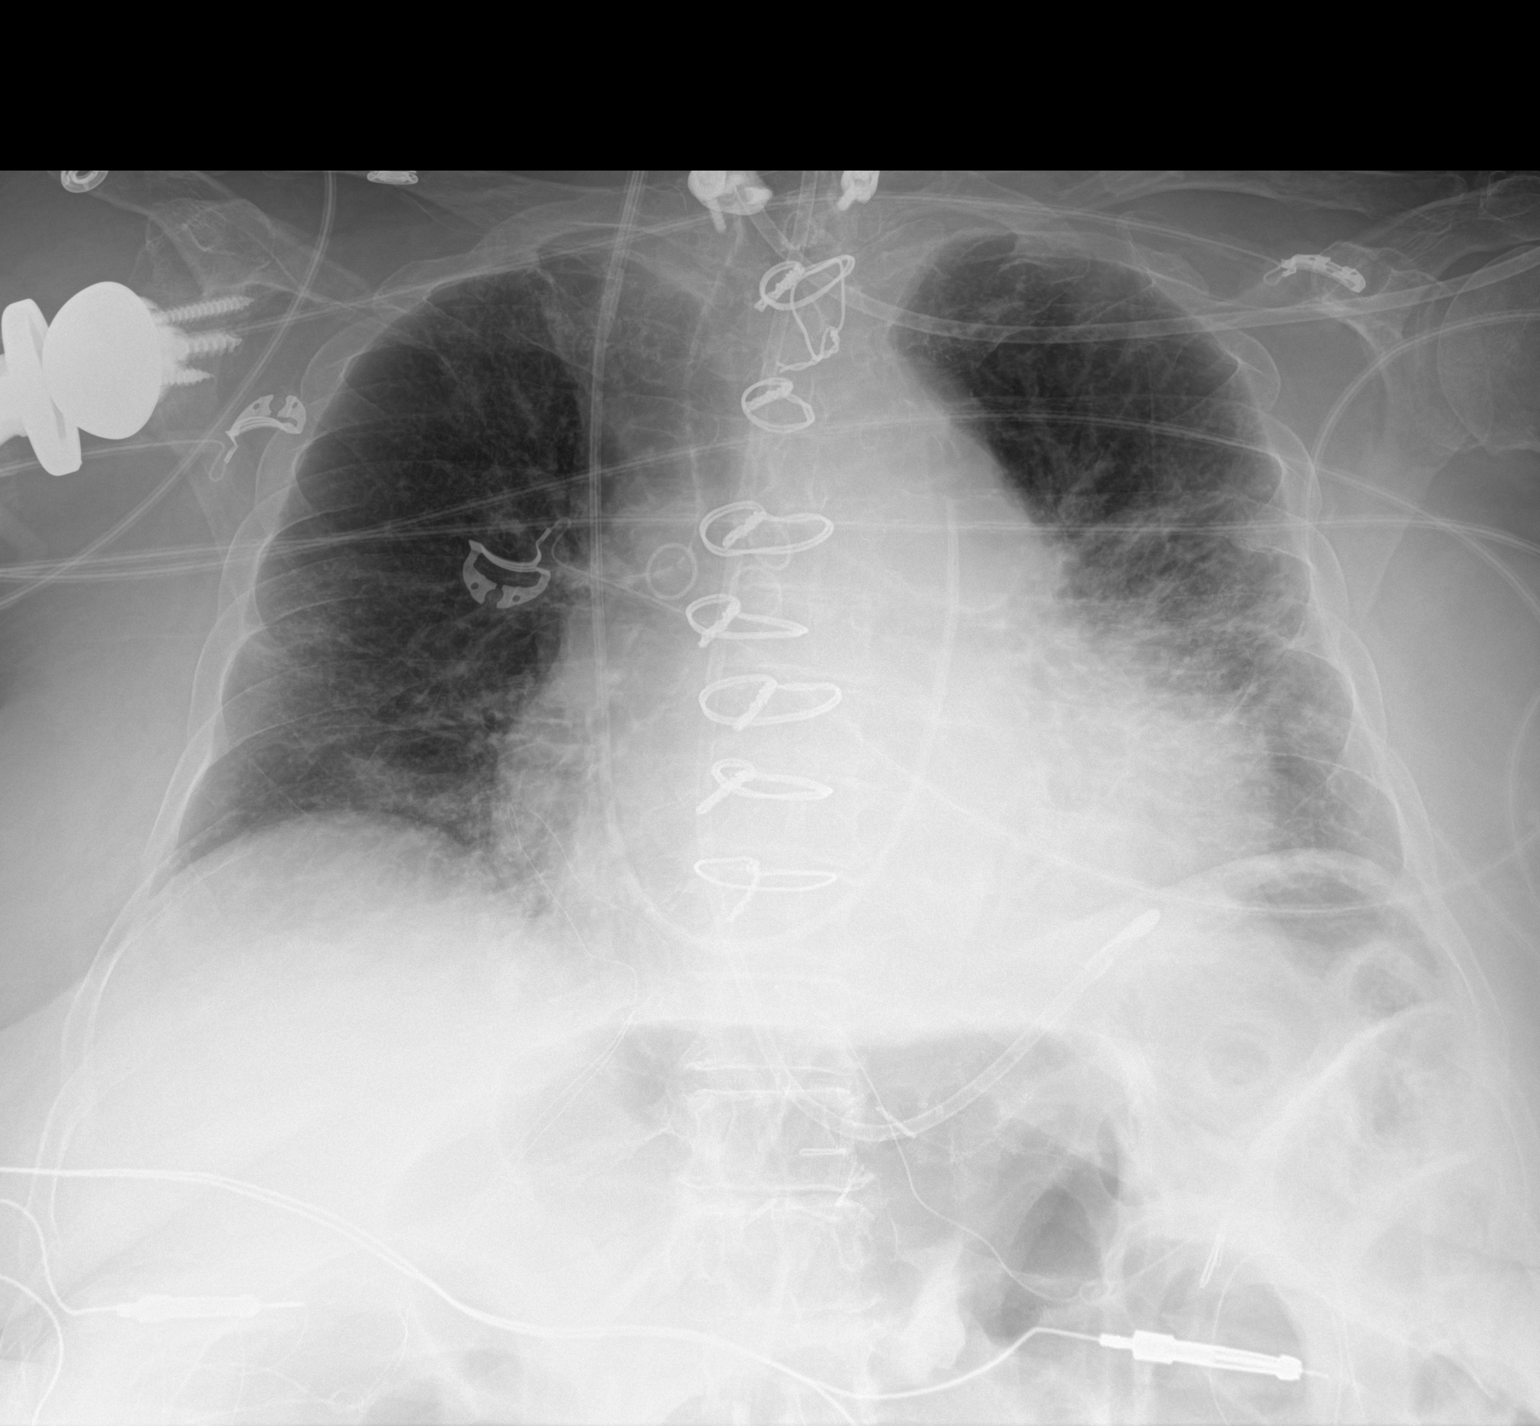

[1 of 1 positions shown; findings below may reference images not displayed]

FINDINGS: Right IJ Cordis through which a Swan-Ganz catheter has been passed
into the proximal left pulmonary artery. Feeding tube extends into
the proximal stomach. Status post median sternotomy for CABG.
Epicardial pacing wires are noted. Orthopedic fixation hardware in
the lower cervical spine. Status post right shoulder arthroplasty.

Lung volumes are low. There continues to be some patchy ill-defined
areas of interstitial prominence and airspace disease in the lungs
bilaterally, most evident throughout the left mid to lower lung,
with slightly improved aeration in the right mid lung. No pleural
effusions. No pneumothorax. No evidence of pulmonary edema. Heart
size is mildly enlarged. Upper mediastinal contours are within
normal limits.
IMPRESSION: 1. Postoperative changes and support apparatus, as above.
2. Improving aeration in the right mid lung, but persistent areas of
interstitial prominence and airspace disease in the lungs
bilaterally, concerning for multilobar bilateral bronchopneumonia.
3. Mild cardiomegaly.

## 2023-07-27 NOTE — Progress Notes (Signed)
Virtual Visit via Telephone Note   Because of Deborah Cooke's co-morbid illnesses, she is at least at moderate risk for complications without adequate follow up.  This format is felt to be most appropriate for this patient at this time.  The patient did not have access to video technology/had technical difficulties with video requiring transitioning to audio format only (telephone).  All issues noted in this document were discussed and addressed.  No physical exam could be performed with this format.  Please refer to the patient's chart for her consent to telehealth for [facility].  Evaluation Performed:  Preoperative cardiovascular risk assessment _____________   Date:  07/27/2023   Patient ID:  Deborah Cooke, Deborah Cooke 1951-01-12, MRN [id number] Patient Location:  Home Provider location:   Office  Primary Care Provider:  Heron Nay, PA Primary Cardiologist:  Lance Muss, MD  Chief Complaint / Patient Profile   72 y.o. y/o female with a h/o CAD s/p inferior STEMI 10/2021 with subtotal occlusion of RCA requiring CABG arrest requiring defibrillation, HFpEF HTN, HLD pulmonary HTN, PE hypothyroidism, left breast CAD, GERD, migraine HA who is pending left hip arthroplasty and presents today for telephonic preoperative cardiovascular risk assessment.  History of Present Illness    Deborah Cooke is a 72 y.o. female who presents via audio/video conferencing for a telehealth visit today.  Pt was last seen in cardiology clinic on 03/07/2023 by Dr. Eldridge Cooke.  At that time Deborah Cooke was doing well and planning to go on a cruise to [place]. BP was borderline low cholesterol was stable.She was encouraged to use compression stockings while on trip. The patient is now pending procedure as outlined above. Since her last visit, she has been doing well from a cardiac perspective.  She is able to complete many of her ADLs but is limited by left hip pain.  She denies chest pain, shortness of  breath, lower extremity edema, fatigue, palpitations, melena, hematuria, hemoptysis, diaphoresis, weakness, presyncope, syncope, orthopnea, and PND.    Past Medical History    Past Medical History:  Diagnosis Date   Adverse effect of anesthetic 01/2012   difficulty waking up.   Adverse effect of unspecified anesthetic, initial encounter 08/2011   low o2 sats.   Anemia    Anxiety    Arthritis    Breast cancer (HCC) 02/16/2017   left breast   Chronic cor pulmonale (HCC) 12/14/2021   Echocardiogram 10/27/21 EF 60-65, no RWMA, Gr 2 DD, D shaped LV c/w RV vol overload, findings c/w acute cor pulmonale, mod reduced RVSF, mildly elevated PASP, RVSP 44, mod BAE, severe MAC, mean MV 3 mmHg, mild dilation of ascending aorta (39 mm)   Chronic pain    Closed fracture of left distal femur (HCC) 10/14/2020   Closed supracondylar fracture of right femur (HCC) 09/18/2012   Depression    Family history of breast cancer    GERD (gastroesophageal reflux disease)    pt denies   Headache(784.0)    Hypertension    Hypothyroidism    Opioid dependence in controlled environment (HCC)    Osteoporosis    Osteoporosis with fracture 09/19/2012   Pneumonia    Thyroid disease    Past Surgical History:  Procedure Laterality Date    OPEN REDUCTION INTERNAL FIXATION (ORIF) DISTAL FEMUR FRACTURE (Left Leg Upper)  10/15/2020   BACK SURGERY     fusion mid back, lumbar and thoracic   BREAST BIOPSY Left 02/16/2017   BREAST LUMPECTOMY  WITH RADIOACTIVE SEED AND SENTINEL LYMPH NODE BIOPSY Left 03/15/2017   Procedure: LEFT BREAST LUMPECTOMY WITH RADIOACTIVE SEED AND LEFT AXILLARY SENTINEL LYMPH NODE BIOPSY;  Surgeon: Ovidio Kin, MD;  Location: [facility];  Service: General;  Laterality: Left;   CHOLECYSTECTOMY  10/1975   CORONARY ARTERY BYPASS GRAFT N/A 10/27/2021   Procedure: CORONARY ARTERY BYPASS GRAFTING (CABG) TIMES FOUR, USING LEFT INTERNAL MAMMARY ARTERY AND RIGHT LEG GREATER SAPHENOUS VEIN  HARVESTED ENDOSCOPICALLY AND RIGHT CORONARY THROMBECTOMY;  Surgeon: Lovett Sox, MD;  Location: [facility] OR;  Service: Open Heart Surgery;  Laterality: N/A;   CORONARY/GRAFT ACUTE MI REVASCULARIZATION N/A 10/26/2021   Procedure: Coronary/Graft Acute MI Revascularization;  Surgeon: Corky Crafts, MD;  Location: [facility] INVASIVE CV LAB;  Service: Cardiovascular;  Laterality: N/A;   ENDOVEIN HARVEST OF GREATER SAPHENOUS VEIN Right 10/27/2021   Procedure: ENDOVEIN HARVEST OF GREATER SAPHENOUS VEIN;  Surgeon: Lovett Sox, MD;  Location: [facility] OR;  Service: Open Heart Surgery;  Laterality: Right;   FEMUR IM NAIL  09/18/2012   Procedure: INTRAMEDULLARY (IM) RETROGRADE FEMORAL NAILING;  Surgeon: Eulas Post, MD;  Location: [facility] OR;  Service: Orthopedics;  Laterality: Right;   GASTRIC BYPASS  2003   IABP INSERTION N/A 10/26/2021   Procedure: IABP Insertion;  Surgeon: Corky Crafts, MD;  Location: [facility] INVASIVE CV LAB;  Service: Cardiovascular;  Laterality: N/A;   JOINT REPLACEMENT     LEFT HEART CATH AND CORONARY ANGIOGRAPHY N/A 10/26/2021   Procedure: LEFT HEART CATH AND CORONARY ANGIOGRAPHY;  Surgeon: Corky Crafts, MD;  Location: [facility] INV[facility] CV LAB;  Service: Cardiovascular;  Laterality: N/A;   NECK SURGERY  2000   ORIF FEMUR FRACTURE Left 10/15/2020   Procedure: OPEN REDUCTION INTERNAL FIXATION (ORIF) DISTAL FEMUR FRACTURE;  Surgeon: Roby Lofts, MD;  Location: [facility] OR;  Service: Orthopedics;  Laterality: Left;   REPLACEMENT TOTAL KNEE  2003   right and left -4 months aoart.   REVERSE SHOULDER ARTHROPLASTY Right 10/20/2021   Procedure: REVERSE SHOULDER ARTHROPLASTY;  Surgeon: Teryl Lucy, MD;  Location: [facility];  Service: Orthopedics;  Laterality: Right;   RIGHT HEART CATH N/A 01/06/2022   Procedure: RIGHT HEART CATH;  Surgeon: Dolores Patty, MD;  Location: [facility] INVASIVE CV LAB;  Service: Cardiovascular;  Laterality: N/A;   TEE WITHOUT CARDIOVERSION N/A 10/27/2021   Procedure:  TRANSESOPHAGEAL ECHOCARDIOGRAM (TEE);  Surgeon: Lovett Sox, MD;  Location: [facility] OR;  Service: Open Heart Surgery;  Laterality: N/A;   TEMPORARY PACEMAKER N/A 10/26/2021   Procedure: TEMPORARY PACEMAKER;  Surgeon: Corky Crafts, MD;  Location: [facility] INVASIVE CV LAB;  Service: Cardiovascular;  Laterality: N/A;   TONSILLECTOMY     WRIST FRACTURE SURGERY Bilateral 08/2011    Allergies  Allergies  Allergen Reactions   Ibuprofen Other (See Comments) and Nausea And Vomiting    Other reaction(s): Abdominal Pain   Nsaids Nausea And Vomiting and Other (See Comments)    GI Upset (ibuprofen included)   Tolmetin Other (See Comments)    Other reaction(s): Abdominal Pain    Home Medications    Prior to Admission medications   Medication Sig Start Date End Date Taking? Authorizing Provider  aspirin EC 81 MG tablet Take 1 tablet (81 mg total) by mouth daily. Swallow whole. 08/23/22   Corky Crafts, MD  B Complex Vitamins (B COMPLEX 100 PO) Take 1 tablet by mouth daily.    [provider]  Biotin 16109 MCG TABS Take 10,000 mcg by mouth daily.  [provider]  buPROPion (WELLBUTRIN XL) 300 MG 24 hr tablet Take 300 mg by mouth daily. 06/09/21   [provider]  Calcium Carbonate (CALCIUM 600 PO) Take 600 mg by mouth in the morning and at bedtime.    [provider]  cholecalciferol (VITAMIN D) 400 units TABS tablet Take 400 Units by mouth daily.    [provider]  clopidogrel (PLAVIX) 75 MG tablet TAKE 1 TABLET(75 MG) BY MOUTH DAILY. DO NOT. STOP UNLESS DIRECTED BY CARDIOLOGY OR CARDIOTHORACIC SURGERY 04/13/23   Corky Crafts, MD  cyclobenzaprine (FLEXERIL) 10 MG tablet Take 1 tablet (10 mg total) by mouth 3 (three) times daily as needed for muscle spasms. 01/08/22   Arrien, York Ram, MD  DULoxetine (CYMBALTA) 60 MG capsule Take 60 mg by mouth 2 (two) times daily.    [provider]  ferrous sulfate 325 (65 FE) MG EC tablet  Take 325 mg by mouth daily with breakfast.    [provider]  gabapentin (NEURONTIN) 300 MG capsule Take 300 mg by mouth at bedtime. 07/13/23 10/11/23  [provider]  hydrOXYzine (ATARAX) 50 MG tablet Take 1 tablet by mouth 3 (three) times daily as needed. 01/27/22   [provider]  levothyroxine (SYNTHROID) 88 MCG tablet Take 88 mcg by mouth daily before breakfast. Also take liothyronine 25    [provider]  liothyronine (CYTOMEL) 25 MCG tablet Take 25 mcg by mouth daily. Also take levothyroxine 88 10/28/21   [provider]  lubiprostone (AMITIZA) 24 MCG capsule Take by mouth.    [provider]  Magnesium Gluconate 250 MG TABS Take by mouth.    [provider]  midodrine (PROAMATINE) 2.5 MG tablet TAKE 1 TABLET(2.5 MG) BY MOUTH THREE TIMES DAILY WITH MEALS 07/12/23   Rollene Rotunda, MD  Multiple Vitamins-Iron (MULTIVITAMIN/IRON PO) Take 1 tablet by mouth daily.     [provider]  polyethylene glycol (MIRALAX / GLYCOLAX) 17 g packet Take 17 g by mouth 2 (two) times daily. Patient taking differently: Take 17 g by mouth daily. 10/21/20   Tyrone Nine, MD  Potassium Chloride ER 20 MEQ TBCR TAKE 1 TABLET BY MOUTH DAILY 01/27/23   Corky Crafts, MD  QUEtiapine (SEROQUEL) 400 MG tablet Take 1 tablet by mouth daily. 01/27/22   [provider]  rosuvastatin (CRESTOR) 40 MG tablet TAKE 1 TABLET(40 MG) BY MOUTH DAILY 04/13/23   Corky Crafts, MD  simethicone (MYLICON) 80 MG chewable tablet Chew 80 mg by mouth every 6 (six) hours as needed for flatulence.    [provider]  spironolactone (ALDACTONE) 25 MG tablet Take 1/2 tablet (12.5 mg total) by mouth daily. 03/07/23   Corky Crafts, MD  torsemide (DEMADEX) 20 MG tablet TAKE 1 TABLET(20 MG) BY MOUTH DAILY 08/09/22   Corky Crafts, MD  vortioxetine HBr (TRINTELLIX) 10 MG TABS tablet Take by mouth. 01/28/23   [provider]     Physical Exam    Vital Signs:  PAYSLEY POPLAR does not have vital signs available for review today.120/72  Given telephonic nature of communication, physical exam is limited. AAOx3. NAD. Normal affect.  Speech and respirations are unlabored.  Accessory Clinical Findings    None  Assessment & Plan    1.  Preoperative Cardiovascular Risk Assessment: -Patient's RCRI score is 11%  The patient affirms she has been doing well without any new cardiac symptoms. They are able to achieve 5 METS without  cardiac limitations. Therefore, based on ACC/AHA guidelines, the patient would be at acceptable risk for the planned procedure without further cardiovascular testing. The patient was advised that if she develops new symptoms prior to surgery to contact our office to arrange for a follow-up visit, and she verbalized understanding.   The patient was advised that if she develops new symptoms prior to surgery to contact our office to arrange for a follow-up visit, and she verbalized understanding.  Plavix for 5 days prior to procedure and should restart postprocedure when surgically safe and hemostasis is achieved.  A copy of this note will be routed to requesting surgeon.  Time:   Today, I have spent 7 minutes with the patient with telehealth technology discussing medical history, symptoms, and management plan.     Napoleon Form, Leodis Rains, NP  07/27/2023, 7:20 AM

## 2023-07-28 IMAGING — DX DG ABDOMEN 1V
1 series · 1 of 1 positions shown · non-contrast
Comparison: 10/31/2021; 10/30/2021; 10/29/2021

CLINICAL DATA: Ileus

EXAM:
ABDOMEN - 1 VIEW

[abdomen]
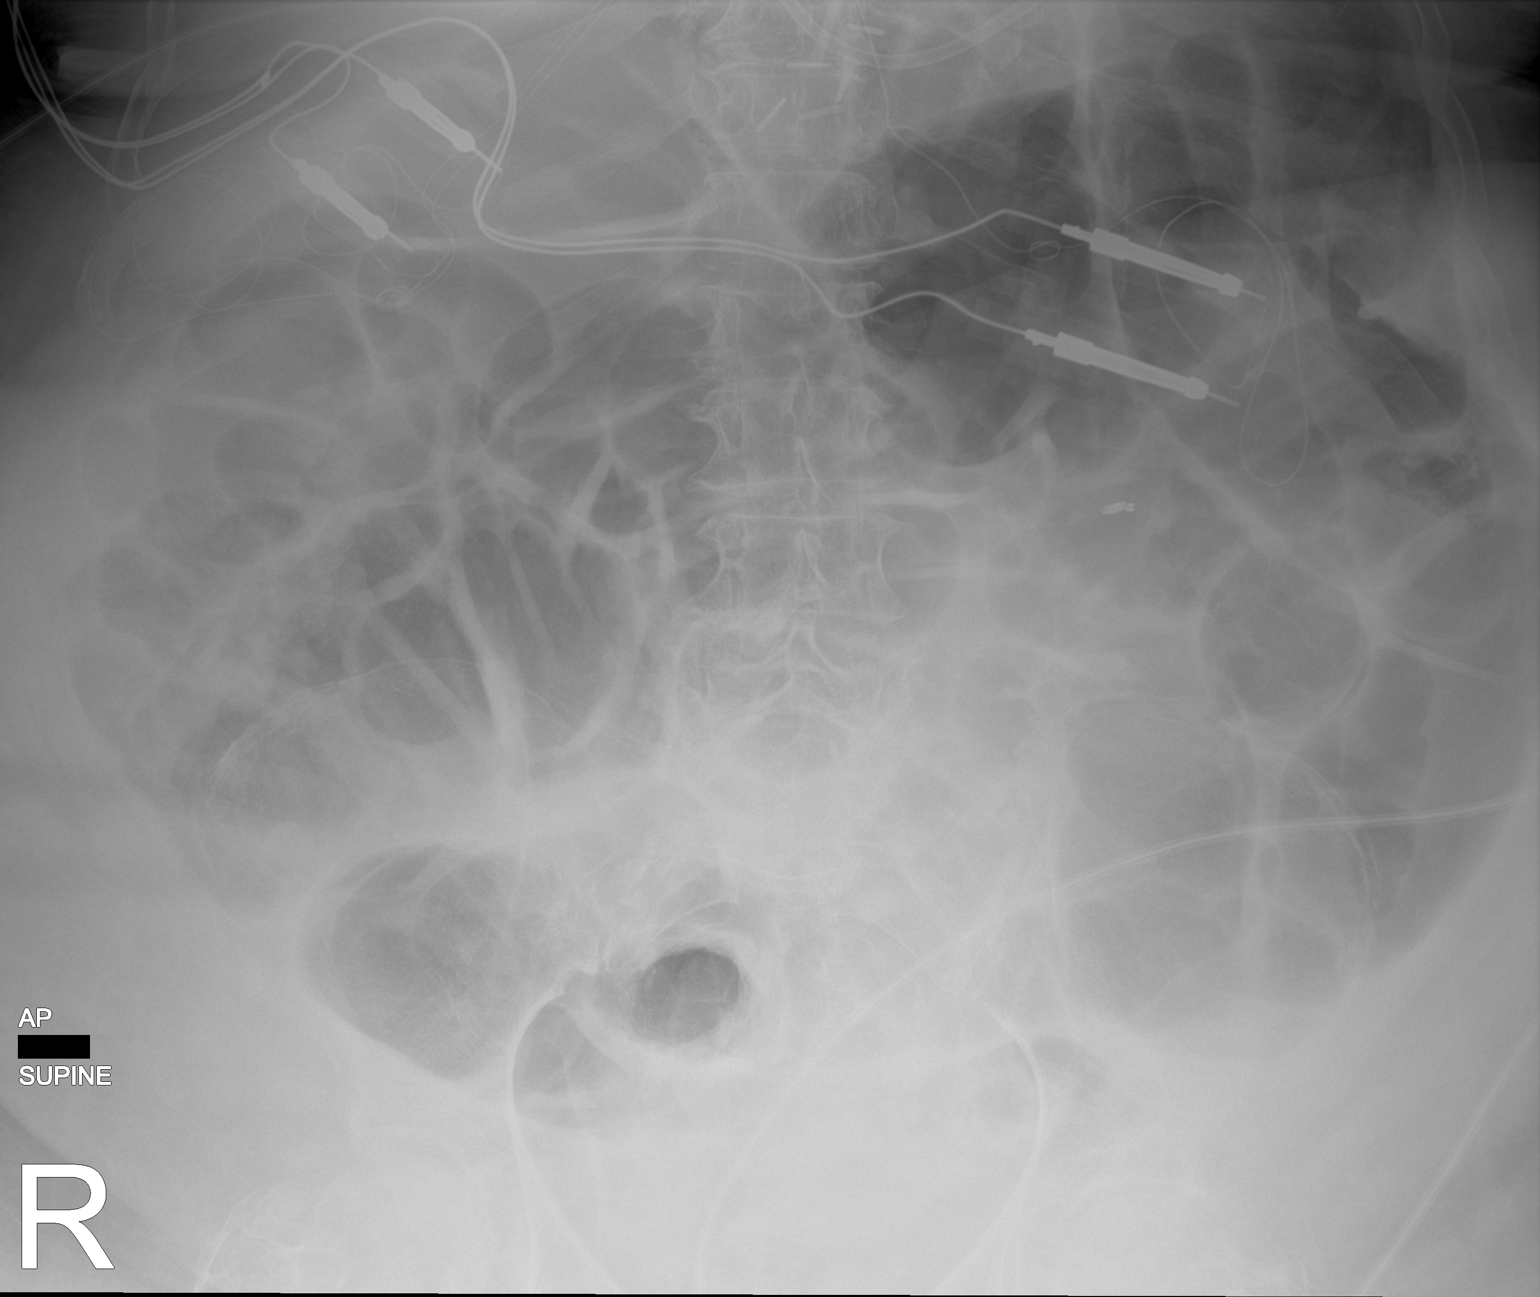

[1 of 1 positions shown; findings below may reference images not displayed]

FINDINGS: Examination is degraded due to patient body habitus.

Similar appearing diffuse predominantly colonic gaseous distention.

Nondiagnostic evaluation for pneumoperitoneum secondary to supine
positioning and exclusion of the lower thorax. No pneumatosis or
portal venous gas.

No definite acute osseous abnormalities.
IMPRESSION: Degraded examination with similar findings most suggestive of
clinical suspicion ileus.

## 2023-07-28 IMAGING — DX DG CHEST 1V PORT
1 series · 1 of 1 positions shown · non-contrast
Comparison: 11/01/2021; 10/31/2021; 10/30/2021; 10/29/2021

CLINICAL DATA: Post CABG

EXAM:
PORTABLE CHEST 1 VIEW

[chest]
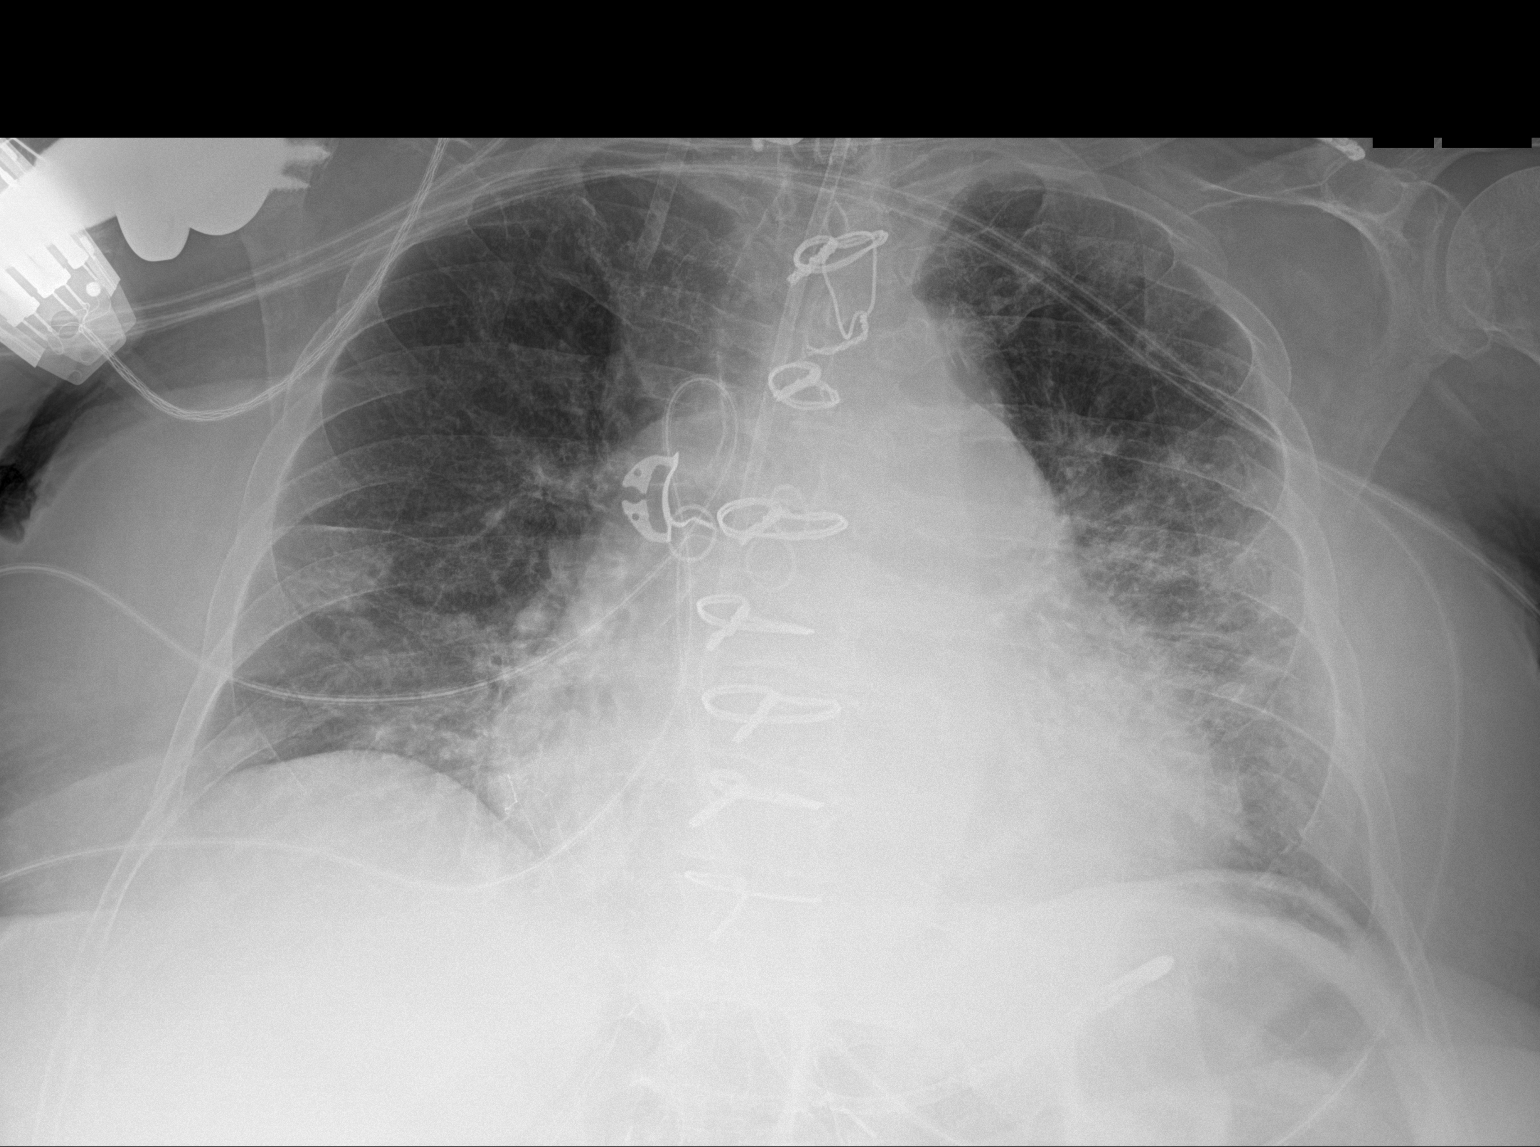

[1 of 1 positions shown; findings below may reference images not displayed]

FINDINGS: Grossly unchanged enlarged cardiac silhouette and mediastinal
contours post median sternotomy and CABG. Interval removal of
Swan-Ganz catheter with remaining vascular sheath tip overlying the
superior aspect of the SVC. Enteric tube tip projects over the
expected location of the gastric fundus.

Pulmonary vasculature remains indistinct with cephalization of flow
and relative areas of consolidation within the bilateral mid lungs,
left greater than right. No definite pleural effusion. No
pneumothorax. No acute osseous abnormalities. Post right total
shoulder replacement, lower cervical paraspinal fusion and ACDF,
incompletely evaluated.
IMPRESSION: 1. Interval removal of PA catheter with remaining vascular sheath
tip projected over the superior aspect of the SVC.
2. Similar findings of cardiomegaly and suspected asymmetric
alveolar pulmonary edema, left greater than right.

## 2023-07-29 IMAGING — DX DG CHEST 1V PORT
1 series · 2 of 2 positions shown · non-contrast
Comparison: Portable chest 11/02/2020 and earlier.

CLINICAL DATA: 71-year-old female postoperative day 7 status post
CABG. Query kinked left upper extremity PICC line.

EXAM:
PORTABLE CHEST 1 VIEW

[Series 1: chest ap · 0.14mm/px · 2 of 2 slices shown]
[im 1/2]
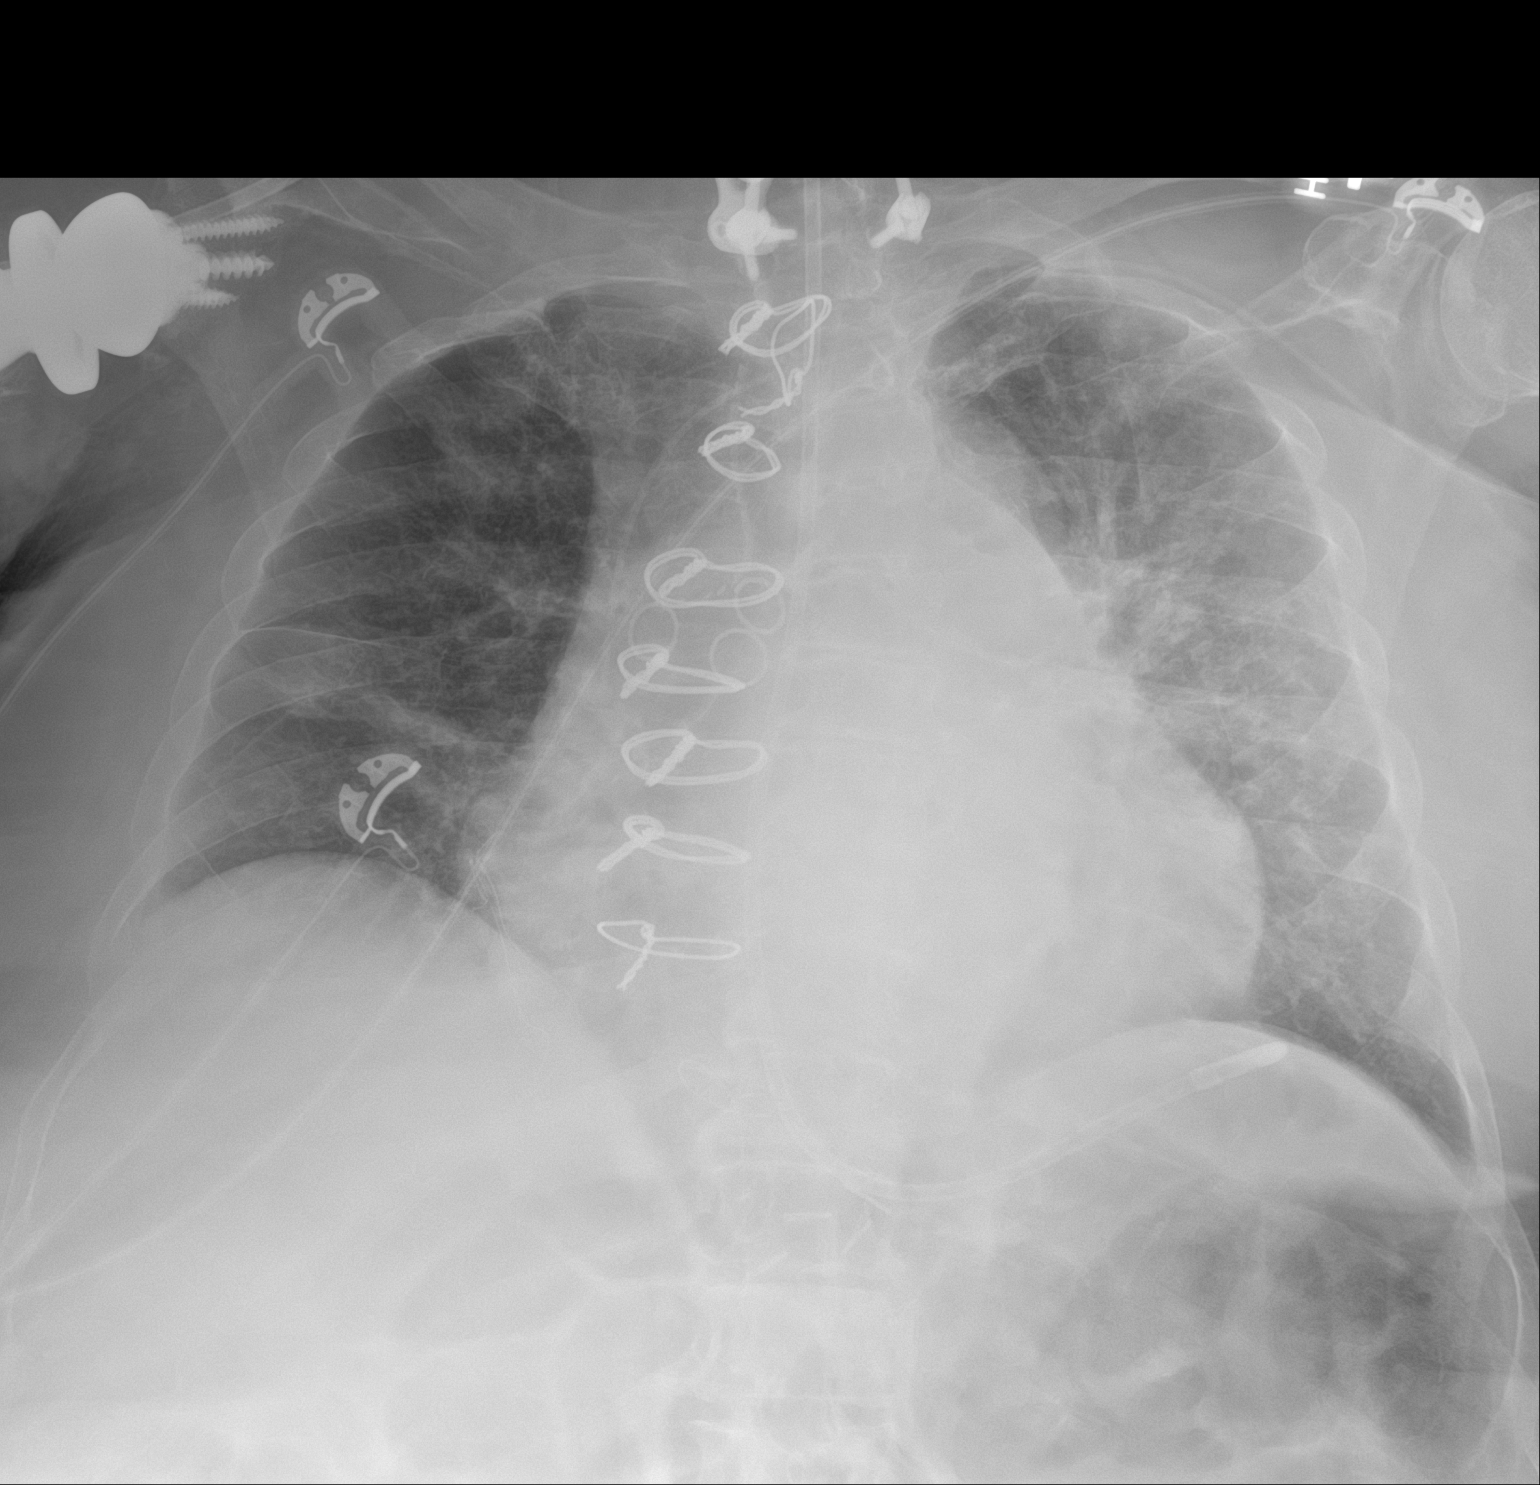
[im 2/2]
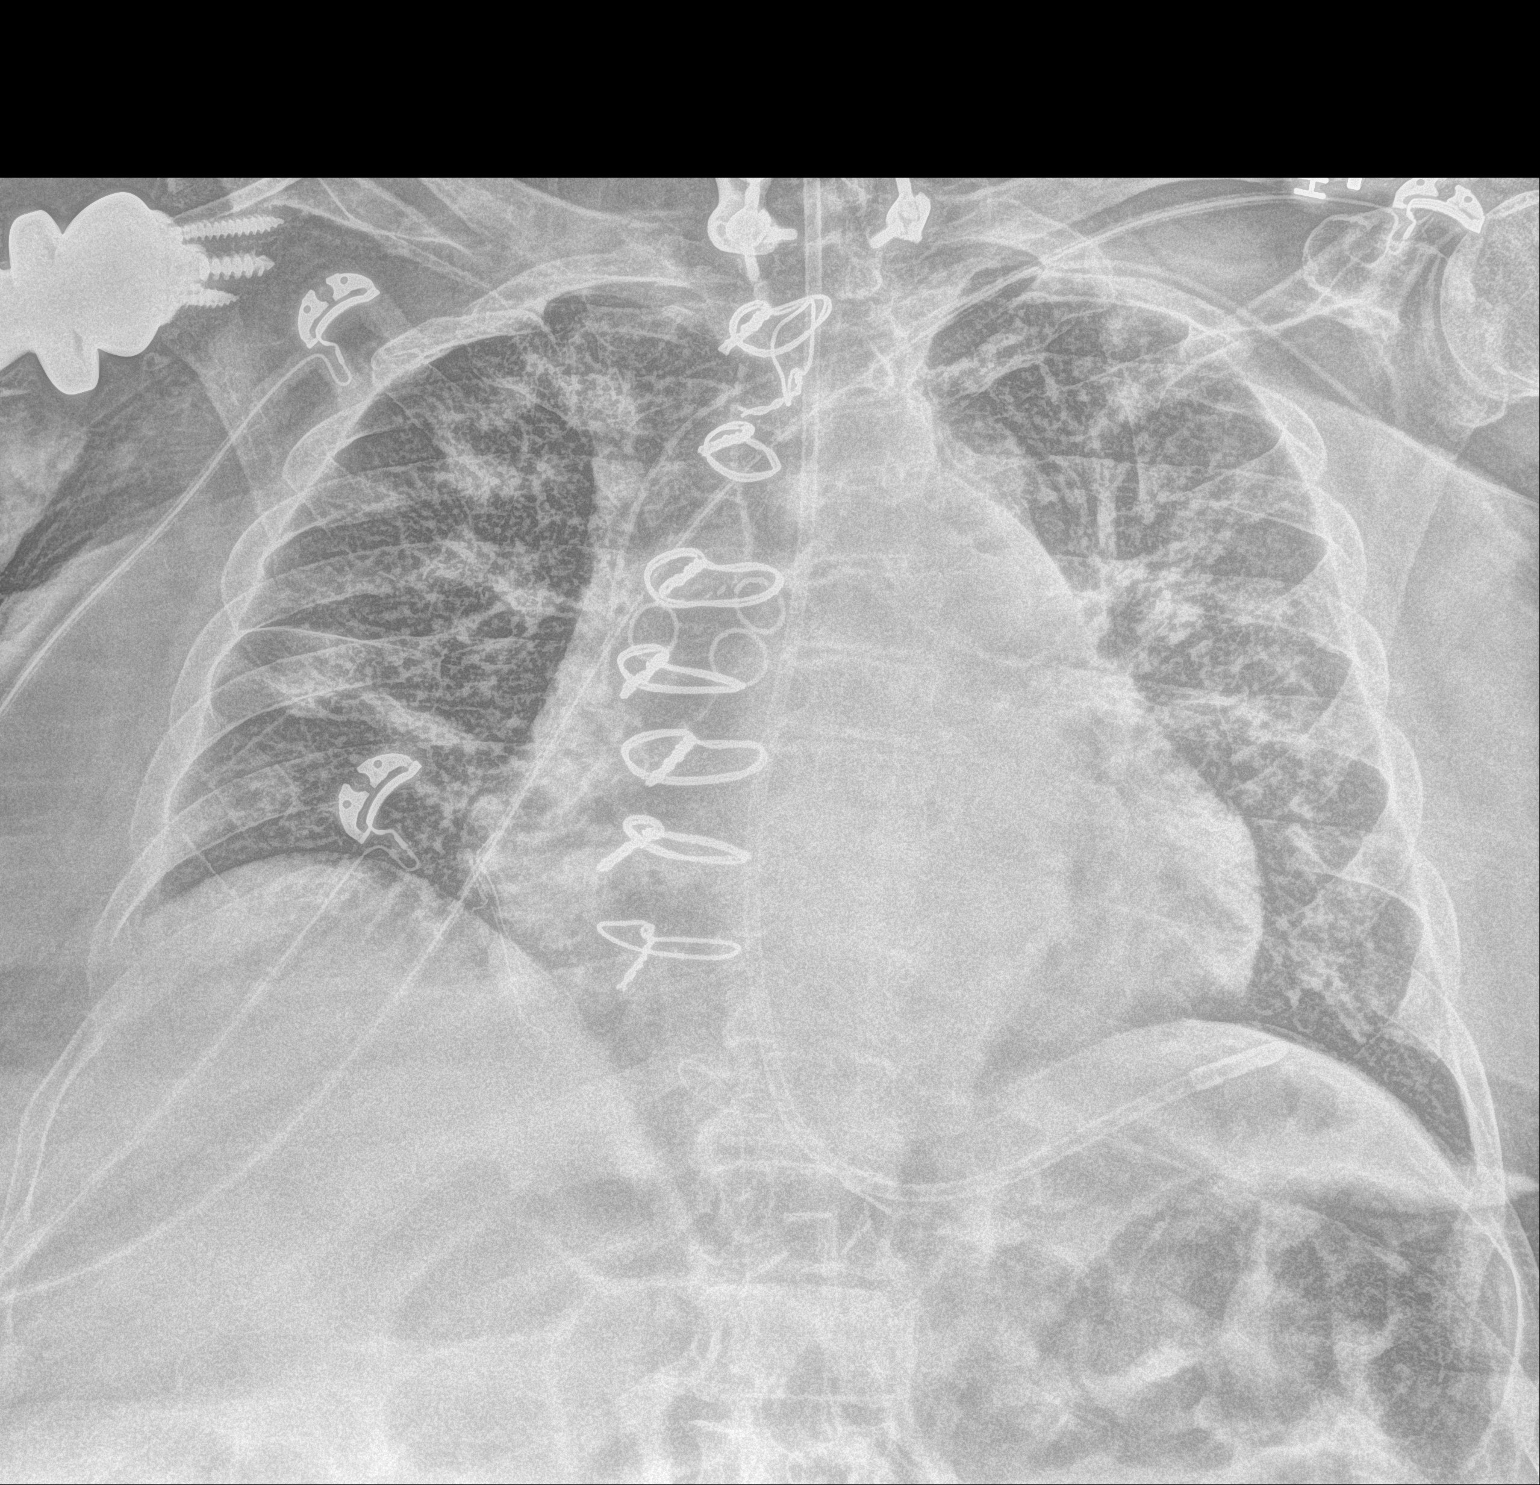

[2 of 2 positions shown; findings below may reference images not displayed]

FINDINGS: Portable AP semi upright view at 6276 hours. New left upper
extremity approach PICC line is in place, terminating about 1
vertebral body level below the carina near the cavoatrial junction.
No kinking of the catheter is identified from the left axilla to its
termination.

Stable enteric feeding tube terminating in the stomach. Right IJ
introducer sheath removed.

Larger lung volumes with decreased perihilar vascular congestion and
improved ventilation in both lungs. No pneumothorax, pleural
effusion. There is some platelike new atelectasis below the right
minor fissure. Stable cardiac size and mediastinal contours. Prior
CABG. Prior anterior and posterior cervical spine fusion, right
shoulder arthroplasty.

Visible bowel-gas pattern within normal limits.
IMPRESSION: 1. Left upper extremity approach PICC line with no kinking of the
catheter from the visible left axilla to its termination in the
lower SVC.
2. Right IJ introducer sheath removed.  Stable enteric tube.
3. Larger lung volumes with decreased perihilar pulmonary edema and
overall improved ventilation in both lungs. Platelike atelectasis
now below the right minor fissure.

## 2023-07-29 NOTE — Telephone Encounter (Signed)
[facility] is calling to get update on clearance. Please advise.

## 2023-07-29 NOTE — Telephone Encounter (Addendum)
I have reviewed notes from pre op APP  yesterday who cleared the pt and has provided recommendations for Plavix if needing to be held. I will call their office and inform that clearance has been sent and that we will re-fax notes again today.   I tried to call the surgeon's office but was on hold for over 4 minutes. I will re-fax notes today.

## 2023-09-07 ENCOUNTER — Ambulatory Visit: Payer: Medicare Other | Admitting: Interventional Cardiology

## 2023-09-07 DIAGNOSIS — E785 Hyperlipidemia, unspecified: Secondary | ICD-10-CM | POA: Insufficient documentation

## 2023-09-07 DIAGNOSIS — M7989 Other specified soft tissue disorders: Secondary | ICD-10-CM | POA: Insufficient documentation

## 2023-09-08 ENCOUNTER — Ambulatory Visit: Payer: Medicare Other | Attending: Interventional Cardiology | Admitting: Cardiology

## 2023-09-08 ENCOUNTER — Encounter: Payer: Self-pay | Admitting: Cardiology

## 2023-09-08 VITALS — BP 120/81 | HR 51 | Ht <= 58 in | Wt 222.0 lb

## 2023-09-08 DIAGNOSIS — I10 Essential (primary) hypertension: Secondary | ICD-10-CM | POA: Diagnosis not present

## 2023-09-08 DIAGNOSIS — Z86711 Personal history of pulmonary embolism: Secondary | ICD-10-CM

## 2023-09-08 DIAGNOSIS — M7989 Other specified soft tissue disorders: Secondary | ICD-10-CM

## 2023-09-08 DIAGNOSIS — E785 Hyperlipidemia, unspecified: Secondary | ICD-10-CM

## 2023-09-08 DIAGNOSIS — I25118 Atherosclerotic heart disease of native coronary artery with other forms of angina pectoris: Secondary | ICD-10-CM

## 2023-09-08 MED ORDER — TORSEMIDE 20 MG PO TABS: 40.0000 mg | ORAL_TABLET | Freq: Every day | ORAL | 3 refills | Status: DC

## 2023-09-08 NOTE — Patient Instructions (Signed)
Medication Instructions:  Stop taking Aspirin. Increase Torsemide 20 mg tablet take 40 mg (2 tabs) by mouth daily. New script sent.  *If you need a refill on your cardiac medications before your next appointment, please call your pharmacy*   Follow-Up: At [facility], you and your health needs are our priority.  As part of our continuing mission to provide you with exceptional heart care, we have created designated Provider Care Teams.  These Care Teams include your primary Cardiologist (physician) and Advanced Practice Providers (APPs -  Physician Assistants and Nurse Practitioners) who all work together to provide you with the care you need, when you need it.  We recommend signing up for the patient portal called "MyChart".  Sign up information is provided on this After Visit Summary.  MyChart is used to connect with patients for Virtual Visits (Telemedicine).  Patients are able to view lab/test results, encounter notes, upcoming appointments, etc.  Non-urgent messages can be sent to your provider as well.   To learn more about what you can do with MyChart, go to [email or web address].    Your next appointment:   6 month(s)  Provider:   First Available APP

## 2023-10-08 ENCOUNTER — Other Ambulatory Visit: Payer: Self-pay | Admitting: Interventional Cardiology

## 2023-10-08 ENCOUNTER — Other Ambulatory Visit: Payer: Self-pay | Admitting: Cardiology

## 2023-10-10 ENCOUNTER — Encounter: Payer: Self-pay | Admitting: Hematology

## 2024-02-09 ENCOUNTER — Encounter: Payer: Self-pay | Admitting: Cardiology

## 2024-02-12 ENCOUNTER — Other Ambulatory Visit: Payer: Self-pay | Admitting: Interventional Cardiology

## 2024-03-06 ENCOUNTER — Other Ambulatory Visit: Payer: Self-pay | Admitting: Interventional Cardiology

## 2024-04-05 ENCOUNTER — Encounter: Payer: Self-pay | Admitting: Cardiology

## 2024-04-05 ENCOUNTER — Other Ambulatory Visit: Payer: Self-pay | Admitting: Interventional Cardiology

## 2024-04-05 MED ORDER — CLOPIDOGREL BISULFATE 75 MG PO TABS: 75.0000 mg | ORAL_TABLET | Freq: Every day | ORAL | 1 refills | Status: DC

## 2024-04-05 MED ORDER — ROSUVASTATIN CALCIUM 40 MG PO TABS: 40.0000 mg | ORAL_TABLET | Freq: Every day | ORAL | 1 refills | Status: DC

## 2024-06-01 ENCOUNTER — Other Ambulatory Visit: Payer: Self-pay | Admitting: Cardiology

## 2024-10-05 ENCOUNTER — Other Ambulatory Visit: Payer: Self-pay | Admitting: Cardiology

## 2024-10-08 NOTE — Telephone Encounter (Signed)
 In accordance with refill protocols, please review and address the following requirements before this medication refill can be authorized:  Labs

## 2024-10-09 ENCOUNTER — Other Ambulatory Visit: Payer: Self-pay | Admitting: Cardiology

## 2024-10-09 MED ORDER — MIDODRINE HCL 2.5 MG PO TABS: 2.5000 mg | ORAL_TABLET | Freq: Three times a day (TID) | ORAL | 0 refills | Status: AC

## 2024-10-09 MED ORDER — POTASSIUM CHLORIDE CRYS ER 20 MEQ PO TBCR: 20.0000 meq | EXTENDED_RELEASE_TABLET | Freq: Every day | ORAL | 0 refills | Status: AC

## 2024-10-09 NOTE — Telephone Encounter (Signed)
 Lipids done on 01/142026

## 2024-10-17 ENCOUNTER — Telehealth: Payer: Self-pay

## 2024-10-17 ENCOUNTER — Other Ambulatory Visit: Payer: Self-pay

## 2024-10-17 ENCOUNTER — Encounter: Payer: Self-pay | Admitting: Hematology

## 2024-10-17 NOTE — Telephone Encounter (Signed)
 Received telephone call from the patient stating she recently had her MM done and her results were sketchy. Patient stated she has to go back to [facility] to have diagnostic mammogram and ultrasound. Patient stated her appt for this procedure is, 10/26/24. Patient wants to make Dr. Lanny aware that [facility] will be faxing Rx requesting her signature. Let patient know that I would make Dr. Lanny aware. Patient voiced understanding and did not have any further questions or concerns.

## 2024-11-16 ENCOUNTER — Ambulatory Visit: Admitting: Cardiology
